# Patient Record
Sex: Female | Born: 1960 | Race: White | Smoking: Never smoker
Health system: Southern US, Community
[De-identification: ages and names within clinical notes are randomized; demographics above are authoritative.]

## PROBLEM LIST (undated history)

## (undated) DIAGNOSIS — K8020 Calculus of gallbladder without cholecystitis without obstruction: Secondary | ICD-10-CM

## (undated) DIAGNOSIS — Z98891 History of uterine scar from previous surgery: Secondary | ICD-10-CM

## (undated) DIAGNOSIS — I10 Essential (primary) hypertension: Secondary | ICD-10-CM

## (undated) DIAGNOSIS — F329 Major depressive disorder, single episode, unspecified: Secondary | ICD-10-CM

## (undated) DIAGNOSIS — G894 Chronic pain syndrome: Secondary | ICD-10-CM

## (undated) DIAGNOSIS — H5500 Unspecified nystagmus: Secondary | ICD-10-CM

## (undated) DIAGNOSIS — E119 Type 2 diabetes mellitus without complications: Secondary | ICD-10-CM

## (undated) DIAGNOSIS — I209 Angina pectoris, unspecified: Secondary | ICD-10-CM

## (undated) DIAGNOSIS — G4730 Sleep apnea, unspecified: Secondary | ICD-10-CM

## (undated) DIAGNOSIS — F32A Depression, unspecified: Secondary | ICD-10-CM

## (undated) DIAGNOSIS — L659 Nonscarring hair loss, unspecified: Secondary | ICD-10-CM

## (undated) DIAGNOSIS — F419 Anxiety disorder, unspecified: Secondary | ICD-10-CM

## (undated) DIAGNOSIS — G4733 Obstructive sleep apnea (adult) (pediatric): Secondary | ICD-10-CM

## (undated) DIAGNOSIS — R42 Dizziness and giddiness: Secondary | ICD-10-CM

## (undated) DIAGNOSIS — M47816 Spondylosis without myelopathy or radiculopathy, lumbar region: Secondary | ICD-10-CM

## (undated) DIAGNOSIS — K219 Gastro-esophageal reflux disease without esophagitis: Secondary | ICD-10-CM

## (undated) DIAGNOSIS — F411 Generalized anxiety disorder: Secondary | ICD-10-CM

## (undated) HISTORY — DX: Type 2 diabetes mellitus without complications: E11.9

## (undated) HISTORY — DX: Nonscarring hair loss, unspecified: L65.9

## (undated) HISTORY — DX: Major depressive disorder, single episode, unspecified: F32.9

## (undated) HISTORY — DX: Essential (primary) hypertension: I10

## (undated) HISTORY — DX: Anxiety disorder, unspecified: F41.9

## (undated) HISTORY — DX: Chronic pain syndrome: G89.4

## (undated) HISTORY — DX: Generalized anxiety disorder: F41.1

## (undated) HISTORY — PX: COLONOSCOPY: SHX174

## (undated) HISTORY — DX: Calculus of gallbladder without cholecystitis without obstruction: K80.20

## (undated) HISTORY — DX: History of uterine scar from previous surgery: Z98.891

## (undated) HISTORY — DX: Dizziness and giddiness: R42

## (undated) HISTORY — DX: Spondylosis without myelopathy or radiculopathy, lumbar region: M47.816

## (undated) HISTORY — DX: Morbid (severe) obesity due to excess calories: E66.01

## (undated) HISTORY — DX: Depression, unspecified: F32.A

## (undated) HISTORY — DX: Unspecified nystagmus: H55.00

---

## 1990-06-26 DIAGNOSIS — Z98891 History of uterine scar from previous surgery: Secondary | ICD-10-CM

## 1990-06-26 HISTORY — DX: History of uterine scar from previous surgery: Z98.891

## 1997-06-26 HISTORY — PX: HEMATOMA EVACUATION: SHX5118

## 2007-04-19 ENCOUNTER — Ambulatory Visit: Payer: Self-pay | Admitting: Family Medicine

## 2007-04-23 DIAGNOSIS — G4730 Sleep apnea, unspecified: Secondary | ICD-10-CM | POA: Insufficient documentation

## 2007-04-23 DIAGNOSIS — E785 Hyperlipidemia, unspecified: Secondary | ICD-10-CM | POA: Insufficient documentation

## 2007-04-23 DIAGNOSIS — F32A Depression, unspecified: Secondary | ICD-10-CM | POA: Insufficient documentation

## 2007-04-23 DIAGNOSIS — J309 Allergic rhinitis, unspecified: Secondary | ICD-10-CM | POA: Insufficient documentation

## 2007-06-07 ENCOUNTER — Ambulatory Visit: Payer: Self-pay

## 2007-08-21 DIAGNOSIS — Z0000 Encounter for general adult medical examination without abnormal findings: Secondary | ICD-10-CM | POA: Insufficient documentation

## 2007-08-21 DIAGNOSIS — E669 Obesity, unspecified: Secondary | ICD-10-CM | POA: Insufficient documentation

## 2008-09-21 ENCOUNTER — Emergency Department: Payer: Self-pay | Admitting: Emergency Medicine

## 2009-06-26 HISTORY — PX: FINGER SURGERY: SHX640

## 2010-03-04 DIAGNOSIS — F419 Anxiety disorder, unspecified: Secondary | ICD-10-CM | POA: Insufficient documentation

## 2012-08-13 ENCOUNTER — Ambulatory Visit: Payer: Self-pay

## 2012-08-16 ENCOUNTER — Ambulatory Visit: Payer: Self-pay

## 2012-11-28 ENCOUNTER — Ambulatory Visit (INDEPENDENT_AMBULATORY_CARE_PROVIDER_SITE_OTHER): Payer: PRIVATE HEALTH INSURANCE | Admitting: General Surgery

## 2012-12-19 ENCOUNTER — Telehealth (INDEPENDENT_AMBULATORY_CARE_PROVIDER_SITE_OTHER): Payer: Self-pay | Admitting: General Surgery

## 2012-12-19 ENCOUNTER — Ambulatory Visit (INDEPENDENT_AMBULATORY_CARE_PROVIDER_SITE_OTHER): Payer: PRIVATE HEALTH INSURANCE | Admitting: General Surgery

## 2012-12-19 NOTE — Telephone Encounter (Signed)
LMOM at 11:30 to ask why patient no showed for her appt with EW 6/26 @ 10..told her to call back to r/s

## 2013-01-03 ENCOUNTER — Encounter (INDEPENDENT_AMBULATORY_CARE_PROVIDER_SITE_OTHER): Payer: Self-pay | Admitting: General Surgery

## 2013-02-05 ENCOUNTER — Ambulatory Visit: Payer: Self-pay

## 2013-03-25 ENCOUNTER — Encounter (INDEPENDENT_AMBULATORY_CARE_PROVIDER_SITE_OTHER): Payer: Self-pay

## 2013-08-19 ENCOUNTER — Ambulatory Visit: Payer: Self-pay

## 2013-08-28 ENCOUNTER — Ambulatory Visit: Payer: Self-pay

## 2013-10-14 ENCOUNTER — Ambulatory Visit: Payer: Self-pay | Admitting: Gastroenterology

## 2014-02-26 ENCOUNTER — Telehealth: Payer: Self-pay | Admitting: Nurse Practitioner

## 2014-03-09 NOTE — Telephone Encounter (Signed)
Several attempts have been made to contact patient. Detailed message left to call back if still needs apptt.

## 2014-12-30 ENCOUNTER — Other Ambulatory Visit
Admission: RE | Admit: 2014-12-30 | Discharge: 2014-12-30 | Disposition: A | Discharge status: Home / Self Care | Payer: Medicaid Other | Source: Ambulatory Visit | Attending: Nurse Practitioner | Admitting: Nurse Practitioner

## 2014-12-30 DIAGNOSIS — Z0000 Encounter for general adult medical examination without abnormal findings: Secondary | ICD-10-CM | POA: Diagnosis present

## 2014-12-30 DIAGNOSIS — R5383 Other fatigue: Secondary | ICD-10-CM | POA: Diagnosis not present

## 2014-12-30 LAB — CBC WITH DIFFERENTIAL/PLATELET
Basophils Absolute: 0 10*3/uL (ref 0–0.1)
Basophils Relative: 0 %
EOS ABS: 0 10*3/uL (ref 0–0.7)
EOS PCT: 0 %
HCT: 44.6 % (ref 35.0–47.0)
Hemoglobin: 15.1 g/dL (ref 12.0–16.0)
Lymphocytes Relative: 19 %
Lymphs Abs: 2 10*3/uL (ref 1.0–3.6)
MCH: 28.2 pg (ref 26.0–34.0)
MCHC: 33.8 g/dL (ref 32.0–36.0)
MCV: 83.4 fL (ref 80.0–100.0)
MONOS PCT: 9 %
Monocytes Absolute: 0.9 10*3/uL (ref 0.2–0.9)
Neutro Abs: 7.5 10*3/uL — ABNORMAL HIGH (ref 1.4–6.5)
Neutrophils Relative %: 72 %
PLATELETS: 296 10*3/uL (ref 150–440)
RBC: 5.35 MIL/uL — AB (ref 3.80–5.20)
RDW: 14.7 % — ABNORMAL HIGH (ref 11.5–14.5)
WBC: 10.5 10*3/uL (ref 3.6–11.0)

## 2014-12-30 LAB — LIPID PANEL
Cholesterol: 228 mg/dL — ABNORMAL HIGH (ref 0–200)
HDL: 55 mg/dL (ref >40)
LDL Cholesterol: 141 mg/dL — ABNORMAL HIGH (ref 0–99)
Total CHOL/HDL Ratio: 4.1 RATIO
Triglycerides: 158 mg/dL — ABNORMAL HIGH (ref <150)
VLDL: 32 mg/dL (ref 0–40)

## 2014-12-30 LAB — COMPREHENSIVE METABOLIC PANEL
ALBUMIN: 4.2 g/dL (ref 3.5–5.0)
ALT: 21 U/L (ref 14–54)
ANION GAP: 8 (ref 5–15)
AST: 18 U/L (ref 15–41)
Alkaline Phosphatase: 86 U/L (ref 38–126)
BUN: 18 mg/dL (ref 6–20)
CALCIUM: 9.2 mg/dL (ref 8.9–10.3)
CO2: 26 mmol/L (ref 22–32)
CREATININE: 0.68 mg/dL (ref 0.44–1.00)
Chloride: 103 mmol/L (ref 101–111)
GFR calc Af Amer: >60 mL/min (ref >60)
GFR calc non Af Amer: >60 mL/min (ref >60)
Glucose, Bld: 117 mg/dL — ABNORMAL HIGH (ref 65–99)
POTASSIUM: 4.1 mmol/L (ref 3.5–5.1)
Sodium: 137 mmol/L (ref 135–145)
TOTAL PROTEIN: 8.1 g/dL (ref 6.5–8.1)
Total Bilirubin: 0.5 mg/dL (ref 0.3–1.2)

## 2014-12-30 LAB — T4, FREE: FREE T4: 0.7 ng/dL (ref 0.61–1.12)

## 2014-12-30 LAB — TSH: TSH: 0.961 u[IU]/mL (ref 0.350–4.500)

## 2014-12-30 LAB — HEMOGLOBIN A1C: HEMOGLOBIN A1C: 5.6 % (ref 4.0–6.0)

## 2014-12-31 LAB — T3: T3 TOTAL: 111 ng/dL (ref 71–180)

## 2015-02-24 ENCOUNTER — Ambulatory Visit: Payer: Self-pay | Admitting: Psychiatry

## 2016-03-10 ENCOUNTER — Other Ambulatory Visit
Admission: RE | Admit: 2016-03-10 | Discharge: 2016-03-10 | Disposition: A | Discharge status: Home / Self Care | Payer: BLUE CROSS/BLUE SHIELD | Source: Ambulatory Visit | Attending: Nurse Practitioner | Admitting: Nurse Practitioner

## 2016-03-10 DIAGNOSIS — E782 Mixed hyperlipidemia: Secondary | ICD-10-CM | POA: Insufficient documentation

## 2016-03-10 DIAGNOSIS — Z0001 Encounter for general adult medical examination with abnormal findings: Secondary | ICD-10-CM | POA: Diagnosis not present

## 2016-03-10 DIAGNOSIS — E559 Vitamin D deficiency, unspecified: Secondary | ICD-10-CM | POA: Diagnosis not present

## 2016-03-10 DIAGNOSIS — I10 Essential (primary) hypertension: Secondary | ICD-10-CM | POA: Insufficient documentation

## 2016-03-10 LAB — COMPREHENSIVE METABOLIC PANEL
ALT: 16 U/L (ref 14–54)
AST: 18 U/L (ref 15–41)
Albumin: 4 g/dL (ref 3.5–5.0)
Alkaline Phosphatase: 75 U/L (ref 38–126)
Anion gap: 7 (ref 5–15)
BUN: 29 mg/dL — AB (ref 6–20)
CALCIUM: 8.9 mg/dL (ref 8.9–10.3)
CO2: 25 mmol/L (ref 22–32)
CREATININE: 0.89 mg/dL (ref 0.44–1.00)
Chloride: 106 mmol/L (ref 101–111)
GFR calc Af Amer: >60 mL/min (ref >60)
GFR calc non Af Amer: >60 mL/min (ref >60)
Glucose, Bld: 98 mg/dL (ref 65–99)
Potassium: 4.2 mmol/L (ref 3.5–5.1)
Sodium: 138 mmol/L (ref 135–145)
TOTAL PROTEIN: 7.4 g/dL (ref 6.5–8.1)
Total Bilirubin: 0.8 mg/dL (ref 0.3–1.2)

## 2016-03-10 LAB — CBC
HEMATOCRIT: 37.2 % (ref 35.0–47.0)
HEMOGLOBIN: 12.8 g/dL (ref 12.0–16.0)
MCH: 28.4 pg (ref 26.0–34.0)
MCHC: 34.4 g/dL (ref 32.0–36.0)
MCV: 82.6 fL (ref 80.0–100.0)
Platelets: 209 10*3/uL (ref 150–440)
RBC: 4.5 MIL/uL (ref 3.80–5.20)
RDW: 14.7 % — ABNORMAL HIGH (ref 11.5–14.5)
WBC: 6.9 10*3/uL (ref 3.6–11.0)

## 2016-03-10 LAB — LIPID PANEL
CHOLESTEROL: 226 mg/dL — AB (ref 0–200)
HDL: 45 mg/dL (ref >40)
LDL Cholesterol: 144 mg/dL — ABNORMAL HIGH (ref 0–99)
TRIGLYCERIDES: 186 mg/dL — AB (ref <150)
Total CHOL/HDL Ratio: 5 RATIO
VLDL: 37 mg/dL (ref 0–40)

## 2016-03-10 LAB — T4, FREE: Free T4: 0.7 ng/dL (ref 0.61–1.12)

## 2016-03-10 LAB — TSH: TSH: 2.522 u[IU]/mL (ref 0.350–4.500)

## 2016-03-11 LAB — VITAMIN D 25 HYDROXY (VIT D DEFICIENCY, FRACTURES): Vit D, 25-Hydroxy: 22.5 ng/mL — ABNORMAL LOW (ref 30.0–100.0)

## 2016-03-21 ENCOUNTER — Other Ambulatory Visit: Payer: Self-pay | Admitting: Nurse Practitioner

## 2016-03-21 DIAGNOSIS — Z1231 Encounter for screening mammogram for malignant neoplasm of breast: Secondary | ICD-10-CM

## 2016-04-13 ENCOUNTER — Ambulatory Visit: Payer: BLUE CROSS/BLUE SHIELD

## 2016-05-04 ENCOUNTER — Ambulatory Visit: Payer: BLUE CROSS/BLUE SHIELD

## 2016-07-19 ENCOUNTER — Ambulatory Visit: Payer: BLUE CROSS/BLUE SHIELD | Attending: Nurse Practitioner

## 2016-08-28 ENCOUNTER — Encounter: Payer: Self-pay | Admitting: Obstetrics and Gynecology

## 2016-08-28 ENCOUNTER — Ambulatory Visit (INDEPENDENT_AMBULATORY_CARE_PROVIDER_SITE_OTHER): Payer: BLUE CROSS/BLUE SHIELD | Admitting: Obstetrics and Gynecology

## 2016-08-28 VITALS — BP 148/98 | HR 75 | Ht 68.0 in | Wt 345.0 lb

## 2016-08-28 DIAGNOSIS — Z780 Asymptomatic menopausal state: Secondary | ICD-10-CM

## 2016-08-28 DIAGNOSIS — R10824 Left lower quadrant rebound abdominal tenderness: Secondary | ICD-10-CM | POA: Diagnosis not present

## 2016-08-28 DIAGNOSIS — N83202 Unspecified ovarian cyst, left side: Secondary | ICD-10-CM

## 2016-08-28 NOTE — Addendum Note (Signed)
Addended by: Dorthula Nettles on: 08/28/2016 12:15 PM   Modules accepted: Orders

## 2016-08-28 NOTE — Addendum Note (Signed)
Addended by: Dorthula Nettles on: 08/28/2016 12:03 PM   Modules accepted: Orders

## 2016-08-28 NOTE — Patient Instructions (Signed)
Ovarian Cyst  An ovarian cyst is a fluid-filled sac that forms on an ovary. The ovaries are small organs that produce eggs in women. Various types of cysts can form on the ovaries. Some may cause symptoms and require treatment. Most ovarian cysts go away on their own, are not cancerous (are benign), and do not cause problems. Common types of ovarian cysts include:  Functional (follicle) cysts.  Occur during the menstrual cycle, and usually go away with the next menstrual cycle if you do not get pregnant.  Usually cause no symptoms.  Endometriomas.  Are cysts that form from the tissue that lines the uterus (endometrium).  Are sometimes called "chocolate cysts" because they become filled with blood that turns brown.  Can cause pain in the lower abdomen during intercourse and during your period.  Cystadenoma cysts.  Develop from cells on the outside surface of the ovary.  Can get very large and cause lower abdomen pain and pain with intercourse.  Can cause severe pain if they twist or break open (rupture).  Dermoid cysts.  Are sometimes found in both ovaries.  May contain different kinds of body tissue, such as skin, teeth, hair, or cartilage.  Usually do not cause symptoms unless they get very big.  Theca lutein cysts.  Occur when too much of a certain hormone (human chorionic gonadotropin) is produced and overstimulates the ovaries to produce an egg.  Are most common after having procedures used to assist with the conception of a baby (in vitro fertilization). What are the causes? Ovarian cysts may be caused by:  Ovarian hyperstimulation syndrome. This is a condition that can develop from taking fertility medicines. It causes multiple large ovarian cysts to form.  Polycystic ovarian syndrome (PCOS). This is a common hormonal disorder that can cause ovarian cysts, as well as problems with your period or fertility. What increases the risk? The following factors may make you  more likely to develop ovarian cysts:  Being overweight or obese.  Taking fertility medicines.  Taking certain forms of hormonal birth control.  Smoking. What are the signs or symptoms? Many ovarian cysts do not cause symptoms. If symptoms are present, they may include:  Pelvic pain or pressure.  Pain in the lower abdomen.  Pain during sex.  Abdominal swelling.  Abnormal menstrual periods.  Increasing pain with menstrual periods. How is this diagnosed? These cysts are commonly found during a routine pelvic exam. You may have tests to find out more about the cyst, such as:  Ultrasound.  X-ray of the pelvis.  CT scan.  MRI.  Blood tests. How is this treated? Many ovarian cysts go away on their own without treatment. Your health care provider may want to check your cyst regularly for 2-3 months to see if it changes. If you are in menopause, it is especially important to have your cyst monitored closely because menopausal women have a higher rate of ovarian cancer. When treatment is needed, it may include:  Medicines to help relieve pain.  A procedure to drain the cyst (aspiration).  Surgery to remove the whole cyst.  Hormone treatment or birth control pills. These methods are sometimes used to help dissolve a cyst. Follow these instructions at home:  Take over-the-counter and prescription medicines only as told by your health care provider.  Do not drive or use heavy machinery while taking prescription pain medicine.  Get regular pelvic exams and Pap tests as often as told by your health care provider.  Return to your   normal activities as told by your health care provider. Ask your health care provider what activities are safe for you.  Do not use any products that contain nicotine or tobacco, such as cigarettes and e-cigarettes. If you need help quitting, ask your health care provider.  Keep all follow-up visits as told by your health care provider. This is  important. Contact a health care provider if:  Your periods are late, irregular, or painful, or they stop.  You have pelvic pain that does not go away.  You have pressure on your bladder or trouble emptying your bladder completely.  You have pain during sex.  You have any of the following in your abdomen:  A feeling of fullness.  Pressure.  Discomfort.  Pain that does not go away.  Swelling.  You feel generally ill.  You become constipated.  You lose your appetite.  You develop severe acne.  You start to have more body hair and facial hair.  You are gaining weight or losing weight without changing your exercise and eating habits.  You think you may be pregnant. Get help right away if:  You have abdominal pain that is severe or gets worse.  You cannot eat or drink without vomiting.  You suddenly develop a fever.  Your menstrual period is much heavier than usual. This information is not intended to replace advice given to you by your health care provider. Make sure you discuss any questions you have with your health care provider. Document Released: 06/12/2005 Document Revised: 12/31/2015 Document Reviewed: 11/14/2015 Elsevier Interactive Patient Education  2017 [employer]  

## 2016-08-28 NOTE — Progress Notes (Signed)
Obstetrics & Gynecology Office Visit   Chief Complaint:  Chief Complaint  Patient presents with  . left ovarian cyst    Referred by PCP    History of Present Illness: 56 year old caucasian female referred by [facility] for findings of 5.2cm left ovarian mass found during work up of right pelvic abdominal pain.  She was treated with steroids and noted improvement in her pain.  She denies early satiety, increased abdominal bloating, weight gain, vaginal bleeding, night sweats, changes in bowl habits.  She does not reports a positive family history of ovarian cancer or breast cancer.  Currently pain is 0/10.  No prior imaging to assess whether this cyst is chronic or acute.     Review of Systems: ROS  Past Medical History:  Past Medical History:  Diagnosis Date  . Anxiety   . Depression   . History of C-section 57  . Hypertension     Past Surgical History:  Past Surgical History:  Procedure Laterality Date  . [place]    Gynecologic History: No LMP recorded. Patient is postmenopausal.  Obstetric History: G2P2000  Family History:  Family History  Problem Relation Age of Onset  . Breast cancer Mother 60  . Colon cancer Mother   . Brain cancer Mother   . Heart Problems Father   . Colon cancer Brother   . Heart Problems Brother     Social History:  Social History   Social History  . Marital status: Divorced    Spouse name: N/A  . Number of children: N/A  . Years of education: N/A   Occupational History  . Not on file.   Social History Main Topics  . Smoking status: Never Smoker  . Smokeless tobacco: Never Used  . Alcohol use Not on file  . Drug use: No  . Sexual activity: No   Other Topics Concern  . Not on file   Social History Narrative  . No narrative on file    Allergies:  No Known Allergies  Medications: Prior to Admission medications   Medication Sig Start Date End Date Taking? Authorizing Provider  ALPRAZolam  (XANAX) 0.5 MG tablet TAKE 1 TABLET BY MOUTH 3 TIMES DAILY AS NEEDED FOR ANXIETY 08/14/16  Yes Historical Provider, MD  cetirizine (ZYRTEC) 10 MG tablet TAKE 1 TABLET(S) BY MOUTH DAILY FOR ALLERGIES 08/05/16  Yes Historical Provider, MD  desvenlafaxine (PRISTIQ) 50 MG 24 hr tablet TAKE 1 TABLET(S) TWICE A DAY DAILY 08/05/16  Yes Historical Provider, MD  enalapril (VASOTEC) 20 MG tablet Take 20 mg by mouth.   Yes Historical Provider, MD  gabapentin (NEURONTIN) 300 MG capsule Take 300 mg by mouth. 04/28/14  Yes Historical Provider, MD  hydrochlorothiazide (HYDRODIURIL) 50 MG tablet Take 50 mg by mouth daily.   Yes Historical Provider, MD    Physical Exam Vitals:  Vitals:   08/28/16 1048  BP: (!) 148/98  Pulse: 75   No LMP recorded. Patient is postmenopausal.  General: NAD, obese HEENT: normocephalic, anicteric Thyroid: no enlargement, no palpable nodules Pulmonary: No increased work of breathing Cardiovascular: RRR, distal pulses 2+ Abdomen: NABS, soft, non-tender, non-distended.  Umbilicus without lesions.  No hepatomegaly, splenomegaly or masses palpable. No evidence of hernia  Genitourinary:  External: Normal external female genitalia.  Normal urethral meatus, normal  Bartholin's and Skene's glands.    Vagina: Normal vaginal mucosa, no evidence of prolapse.    Cervix: Grossly normal in appearance, no bleeding  Uterus:  Non-enlarged, mobile, normal contour.  No CMT  Adnexa: ovaries non-enlarged, no adnexal masses  Rectal: deferred  Lymphatic: no evidence of inguinal lymphadenopathy Extremities: no edema, erythema, or tenderness Neurologic: Grossly intact Psychiatric: mood appropriate, affect full  Female chaperone present for pelvic and breast  portions of the physical exam   Assessment: 56 y.o. G2P2000 No problem-specific Assessment & Plan notes found for this encounter.   Plan: Problem List Items Addressed This Visit    None    Visit Diagnoses    Cyst of left ovary    -   Primary   Relevant Orders   OVA 1   US Transvaginal Non-OB   Left lower quadrant abdominal tenderness with rebound tenderness       Relevant Orders   OVA 1   US Transvaginal Non-OB   Postmenopausal       Relevant Orders   OVA 1   US Transvaginal Non-OB     - Check OVA1 testing  - Repeat US to assess stability or increase in size in cyst also further characterize - final management plan pending results of blood work and repeat imaging

## 2016-09-01 LAB — OVA 1
CA 125: 8.1 U/mL (ref 0.0–38.1)
CEA: 1 ng/mL (ref 0.0–4.7)
LIPID-ASSOCIATED SIALIC ACID: 9 mg/dL (ref <20)

## 2016-09-06 ENCOUNTER — Telehealth: Payer: Self-pay | Admitting: Obstetrics and Gynecology

## 2016-09-06 NOTE — Telephone Encounter (Signed)
Results call

## 2016-09-08 ENCOUNTER — Ambulatory Visit
Admission: RE | Admit: 2016-09-08 | Discharge: 2016-09-08 | Disposition: A | Discharge status: Home / Self Care | Payer: BLUE CROSS/BLUE SHIELD | Source: Ambulatory Visit | Attending: Nurse Practitioner | Admitting: Nurse Practitioner

## 2016-09-08 DIAGNOSIS — Z1231 Encounter for screening mammogram for malignant neoplasm of breast: Secondary | ICD-10-CM | POA: Insufficient documentation

## 2016-09-26 ENCOUNTER — Other Ambulatory Visit: Payer: BLUE CROSS/BLUE SHIELD

## 2016-09-26 ENCOUNTER — Ambulatory Visit: Payer: BLUE CROSS/BLUE SHIELD | Admitting: Obstetrics and Gynecology

## 2016-10-17 ENCOUNTER — Ambulatory Visit: Payer: BLUE CROSS/BLUE SHIELD | Admitting: Obstetrics and Gynecology

## 2016-10-17 ENCOUNTER — Other Ambulatory Visit: Payer: BLUE CROSS/BLUE SHIELD

## 2016-10-17 ENCOUNTER — Ambulatory Visit: Payer: BLUE CROSS/BLUE SHIELD

## 2017-05-22 ENCOUNTER — Other Ambulatory Visit
Admission: RE | Admit: 2017-05-22 | Discharge: 2017-05-22 | Disposition: A | Discharge status: Home / Self Care | Payer: BLUE CROSS/BLUE SHIELD | Source: Ambulatory Visit | Attending: Nurse Practitioner | Admitting: Nurse Practitioner

## 2017-05-22 DIAGNOSIS — I10 Essential (primary) hypertension: Secondary | ICD-10-CM | POA: Diagnosis present

## 2017-05-22 DIAGNOSIS — Z0000 Encounter for general adult medical examination without abnormal findings: Secondary | ICD-10-CM | POA: Diagnosis not present

## 2017-05-22 DIAGNOSIS — E669 Obesity, unspecified: Secondary | ICD-10-CM | POA: Diagnosis present

## 2017-05-22 LAB — COMPREHENSIVE METABOLIC PANEL
ALBUMIN: 3.9 g/dL (ref 3.5–5.0)
ALT: 26 U/L (ref 14–54)
ANION GAP: 9 (ref 5–15)
AST: 25 U/L (ref 15–41)
Alkaline Phosphatase: 74 U/L (ref 38–126)
BILIRUBIN TOTAL: 0.6 mg/dL (ref 0.3–1.2)
BUN: 18 mg/dL (ref 6–20)
CO2: 22 mmol/L (ref 22–32)
Calcium: 9.1 mg/dL (ref 8.9–10.3)
Chloride: 106 mmol/L (ref 101–111)
Creatinine, Ser: 0.8 mg/dL (ref 0.44–1.00)
GFR calc non Af Amer: >60 mL/min (ref >60)
Glucose, Bld: 106 mg/dL — ABNORMAL HIGH (ref 65–99)
POTASSIUM: 4.1 mmol/L (ref 3.5–5.1)
SODIUM: 137 mmol/L (ref 135–145)
TOTAL PROTEIN: 7.1 g/dL (ref 6.5–8.1)

## 2017-05-22 LAB — LIPID PANEL
CHOL/HDL RATIO: 4.8 ratio
Cholesterol: 216 mg/dL — ABNORMAL HIGH (ref 0–200)
HDL: 45 mg/dL (ref >40)
LDL Cholesterol: 136 mg/dL — ABNORMAL HIGH (ref 0–99)
TRIGLYCERIDES: 173 mg/dL — AB (ref <150)
VLDL: 35 mg/dL (ref 0–40)

## 2017-05-22 LAB — CBC WITH DIFFERENTIAL/PLATELET
Basophils Absolute: 0.1 10*3/uL (ref 0–0.1)
Basophils Relative: 1 %
EOS ABS: 0.2 10*3/uL (ref 0–0.7)
EOS PCT: 2 %
HCT: 39.3 % (ref 35.0–47.0)
Hemoglobin: 13 g/dL (ref 12.0–16.0)
LYMPHS ABS: 2.2 10*3/uL (ref 1.0–3.6)
Lymphocytes Relative: 30 %
MCH: 27.9 pg (ref 26.0–34.0)
MCHC: 33.1 g/dL (ref 32.0–36.0)
MCV: 84.4 fL (ref 80.0–100.0)
MONO ABS: 0.7 10*3/uL (ref 0.2–0.9)
MONOS PCT: 10 %
Neutro Abs: 4.2 10*3/uL (ref 1.4–6.5)
Neutrophils Relative %: 57 %
PLATELETS: 258 10*3/uL (ref 150–440)
RBC: 4.66 MIL/uL (ref 3.80–5.20)
RDW: 15.4 % — AB (ref 11.5–14.5)
WBC: 7.3 10*3/uL (ref 3.6–11.0)

## 2017-05-22 LAB — T4, FREE: FREE T4: 0.73 ng/dL (ref 0.61–1.12)

## 2017-05-22 LAB — TSH: TSH: 1.773 u[IU]/mL (ref 0.350–4.500)

## 2017-06-08 DIAGNOSIS — G4733 Obstructive sleep apnea (adult) (pediatric): Secondary | ICD-10-CM | POA: Diagnosis not present

## 2017-06-27 DIAGNOSIS — F331 Major depressive disorder, recurrent, moderate: Secondary | ICD-10-CM | POA: Insufficient documentation

## 2017-06-27 DIAGNOSIS — F411 Generalized anxiety disorder: Secondary | ICD-10-CM | POA: Insufficient documentation

## 2017-06-27 DIAGNOSIS — I10 Essential (primary) hypertension: Secondary | ICD-10-CM | POA: Insufficient documentation

## 2017-06-27 DIAGNOSIS — E1159 Type 2 diabetes mellitus with other circulatory complications: Secondary | ICD-10-CM | POA: Insufficient documentation

## 2017-06-27 DIAGNOSIS — G4710 Hypersomnia, unspecified: Secondary | ICD-10-CM | POA: Insufficient documentation

## 2017-06-27 DIAGNOSIS — N390 Urinary tract infection, site not specified: Secondary | ICD-10-CM | POA: Insufficient documentation

## 2017-06-27 DIAGNOSIS — F1390 Sedative, hypnotic, or anxiolytic use, unspecified, uncomplicated: Secondary | ICD-10-CM | POA: Insufficient documentation

## 2017-06-28 ENCOUNTER — Ambulatory Visit: Payer: Self-pay | Admitting: Nurse Practitioner

## 2017-07-03 ENCOUNTER — Ambulatory Visit: Payer: BLUE CROSS/BLUE SHIELD | Admitting: Nurse Practitioner

## 2017-07-03 ENCOUNTER — Encounter: Payer: Self-pay | Admitting: Nurse Practitioner

## 2017-07-03 VITALS — BP 165/89 | HR 76 | Resp 16 | Ht 68.0 in | Wt 367.2 lb

## 2017-07-03 DIAGNOSIS — M5440 Lumbago with sciatica, unspecified side: Secondary | ICD-10-CM | POA: Diagnosis not present

## 2017-07-03 DIAGNOSIS — F331 Major depressive disorder, recurrent, moderate: Secondary | ICD-10-CM

## 2017-07-03 DIAGNOSIS — F411 Generalized anxiety disorder: Secondary | ICD-10-CM | POA: Diagnosis not present

## 2017-07-03 DIAGNOSIS — G4733 Obstructive sleep apnea (adult) (pediatric): Secondary | ICD-10-CM | POA: Diagnosis not present

## 2017-07-03 DIAGNOSIS — J309 Allergic rhinitis, unspecified: Secondary | ICD-10-CM | POA: Insufficient documentation

## 2017-07-03 DIAGNOSIS — I10 Essential (primary) hypertension: Secondary | ICD-10-CM

## 2017-07-03 DIAGNOSIS — M25561 Pain in right knee: Secondary | ICD-10-CM | POA: Insufficient documentation

## 2017-07-03 DIAGNOSIS — M25569 Pain in unspecified knee: Secondary | ICD-10-CM | POA: Insufficient documentation

## 2017-07-03 MED ORDER — DESVENLAFAXINE SUCCINATE ER 100 MG PO TB24: 100.0000 mg | ORAL_TABLET | Freq: Every day | ORAL | 3 refills | Status: DC

## 2017-07-03 MED ORDER — OXYCODONE-ACETAMINOPHEN 5-325 MG PO TABS: 1.0000 | ORAL_TABLET | Freq: Three times a day (TID) | ORAL | 0 refills | Status: DC | PRN

## 2017-07-03 MED ORDER — ALPRAZOLAM 0.5 MG PO TABS: 0.5000 mg | ORAL_TABLET | Freq: Three times a day (TID) | ORAL | 3 refills | Status: DC | PRN

## 2017-07-03 NOTE — Progress Notes (Signed)
[facility] [place], [place] [address]  Internal MEDICINE  Office Visit Note  Patient Name: Candace Clayton  [id number]  [id number]  Date of Service: 07/03/2017     Complaints/HPI Pt is here for routine follow up.  The patient is here for routine follow up. She does note that her depression has worsened some. Hard to find the energy and motivation to get out of bed on her days off. Feels like there is a great deal of work place bullying going on and she is the focus of the bullying. Becomes easily tearful when taling about her workplace situation Continues to have right sided lower back pain, radiating into the right hip and right upper leg. She does need to have refills of her pain medication, which she does take when needed     Current Medication: Outpatient Encounter Medications as of 07/03/2017  Medication Sig  . ALPRAZolam (XANAX) 0.5 MG tablet TAKE 1 TABLET BY MOUTH 3 TIMES DAILY AS NEEDED FOR ANXIETY  . cetirizine (ZYRTEC) 10 MG tablet TAKE 1 TABLET(S) BY MOUTH DAILY FOR ALLERGIES  . desvenlafaxine (PRISTIQ) 50 MG 24 hr tablet TAKE 1 TABLET(S) TWICE A DAY DAILY  . enalapril (VASOTEC) 20 MG tablet Take 20 mg by mouth 2 (two) times daily.   . hydrochlorothiazide (HYDRODIURIL) 25 MG tablet Take 25 mg by mouth daily.   Marland Kitchen lidocaine (LIDODERM) 5 % Place 1 patch onto the skin daily as needed (back pain). Remove & Discard patch within 12 hours or as directed by MD  . oxyCODONE-acetaminophen (PERCOCET/ROXICET) 5-325 MG tablet Take 1 tablet by mouth 3 (three) times daily as needed for severe pain.  Marland Kitchen tiZANidine (ZANAFLEX) 4 MG tablet Take 4 mg by mouth 2 (two) times daily as needed for muscle spasms.  Marland Kitchen gabapentin (NEURONTIN) 300 MG capsule Take 300 mg by mouth.   No facility-administered encounter medications on file as of 07/03/2017.     Surgical History: Past Surgical History:  Procedure Laterality Date  . CESAREAN SECTION  1992  . FINGER SURGERY  2011     Medical History: Past Medical History:  Diagnosis Date  . Anxiety   . Depression   . History of C-section 18  . Hypertension     Family History: Family History  Problem Relation Age of Onset  . Breast cancer Mother 40       x 3 times  . Colon cancer Mother   . Brain cancer Mother   . Cancer - Colon Mother   . Cancer - Other Mother   . Heart Problems Father   . Colon cancer Brother   . Heart Problems Brother     Social History   Socioeconomic History  . Marital status: Divorced    Spouse name: Not on file  . Number of children: Not on file  . Years of education: Not on file  . Highest education level: Not on file  Social Needs  . Financial resource strain: Not on file  . Food insecurity - worry: Not on file  . Food insecurity - inability: Not on file  . Transportation needs - medical: Not on file  . Transportation needs - non-medical: Not on file  Occupational History  . Not on file  Tobacco Use  . Smoking status: Never Smoker  . Smokeless tobacco: Never Used  Substance and Sexual Activity  . Alcohol use: No    Frequency: Never  . Drug use: No  . Sexual activity: No  Birth control/protection: Post-menopausal  Other Topics Concern  . Not on file  Social History Narrative  . Not on file      Review of Systems  Constitutional: Positive for fatigue. Negative for activity change, appetite change and unexpected weight change.  HENT: Negative.   Eyes: Negative.   Respiratory: Negative for chest tightness, shortness of breath and wheezing.   Cardiovascular: Negative for chest pain and palpitations.  Gastrointestinal: Negative for abdominal pain, constipation, diarrhea, nausea and vomiting.  Endocrine: Negative for cold intolerance, heat intolerance, polydipsia and polyphagia.  Genitourinary: Negative.   Musculoskeletal: Positive for arthralgias, back pain and myalgias.  Skin: Negative.   Allergic/Immunologic: Negative.   Neurological: Negative.    Hematological: Negative.   Psychiatric/Behavioral: Positive for behavioral problems and sleep disturbance. The patient is nervous/anxious.    Today's Vitals   07/03/17 1006  BP: (!) 165/89  Pulse: 76  Resp: 16  SpO2: 98%  Weight: (!) 367 lb 3.2 oz (166.6 kg)  Height: 5\' 8"  (1.727 m)    Physical Exam  Constitutional: She is oriented to person, place, and time. She appears well-developed and well-nourished.  HENT:  Head: Normocephalic and atraumatic.  Eyes: Pupils are equal, round, and reactive to light.  Neck: Normal range of motion. Neck supple. No thyromegaly present.  Cardiovascular: Normal rate, regular rhythm and normal heart sounds.  Pulmonary/Chest: Effort normal and breath sounds normal.  Abdominal: Soft. There is no tenderness.  Musculoskeletal:       Back:  Lymphadenopathy:    She has no cervical adenopathy.  Neurological: She is alert and oriented to person, place, and time.  Skin: Skin is warm and dry.  Psychiatric: Her speech is normal and behavior is normal. Judgment and thought content normal. Her mood appears anxious. Cognition and memory are normal. She exhibits a depressed mood.  Tearful.  Nursing note and vitals reviewed.   Assessment/Plan:   ICD-10-CM   1. Essential (primary) hypertension I10   2. Major depressive disorder, recurrent episode, moderate (HCC) F33.1 desvenlafaxine (PRISTIQ) 100 MG 24 hr tablet  3. GAD (generalized anxiety disorder) F41.1 ALPRAZolam (XANAX) 0.5 MG tablet  4. Low back pain with sciatica, sciatica laterality unspecified, unspecified back pain laterality, unspecified chronicity M54.40 oxyCODONE-acetaminophen (PERCOCET/ROXICET) 5-325 MG tablet  5. Obstructive sleep apnea, adult G47.33     1. Blood pressure stable. Continue bp medication as prescribed  2. Increase desvenlafaxine to 100mg  daily. Reassess at next visit.  3. Ok to continue alprazolam 0.5mg  as needed and as prescribed. New rx sent to pharmacy. 4. Apply lidocaine  patches to lower back and hip as needed and as prescribed. Use tizanidine 4mg  twice daily if needed to relieve tight and sore muscles. New rx for oxycodone 5/325mg  tablets given to patient. #1 tablets allowed. Patient to use only for severe pain, unrelieved by other medications.  5. Continue to use CPAP as ordered  She should follow up in 3 months and sooner if needed  General Counseling: I have discussed the findings of the evaluation and examination with Demica.  I have also discussed any further diagnostic evaluation that may be needed or ordered today. Kaizlee verbalizes understanding of the findings of todays visit. We also reviewed her medications today. she has been encouraged to call the office with any questions or concerns that should arise related to todays visit.  Reviewed risks and possible side effects associated with taking opiates and benzodiazepines. Combination of these could cause dizziness and drowsiness. Advised him not to drive or operate  machinery when taking these medications, as he could put his life and the lives of others at risk. He voiced understanding.   This patient was seen by Leretha Pol, FNP- C in Collaboration with Dr Lavera Guise as a part of collaborative care agreement    Time spent:15 minutes    Dr Lavera Guise Internal medicine

## 2017-08-15 ENCOUNTER — Ambulatory Visit: Payer: Self-pay

## 2017-08-16 ENCOUNTER — Ambulatory Visit: Payer: Self-pay | Admitting: Internal Medicine

## 2017-08-21 ENCOUNTER — Ambulatory Visit: Payer: Self-pay | Admitting: Internal Medicine

## 2017-08-22 ENCOUNTER — Other Ambulatory Visit: Payer: Self-pay

## 2017-08-23 ENCOUNTER — Other Ambulatory Visit: Payer: Self-pay | Admitting: Internal Medicine

## 2017-08-23 ENCOUNTER — Telehealth: Payer: Self-pay

## 2017-08-23 NOTE — Telephone Encounter (Signed)
-----   Message from [place] sent at 08/22/2017 10:34 AM EST ----- Sherri Rad please call this pt when I tried to explain the instructions she is only getting more confused with dosing and said her pharmacy wouldn't give her more (probaly bc she was over taking it)???? ----- Message ----- From: Ronnell [place], NP Sent: 08/22/2017  10:09 AM To: Laurie Panda  The prescription is for once daily. She should cut out the second dose. Thanks  ----- Message ----- From: Laurie Panda Sent: 08/22/2017   8:43 AM To: Ronnell [place], NP  PT CALLED CONFUSED WITH HER ANTI DEPRSSANT. WAS ON 50MG  TWICE DAY AND AT LAST APPT WE CHANGED RX AND NOW PT IS TAKING 100MG  TWICE DAY AND FEELS SIDE EFFECTS, PLEASE CHANGE OR CLARIFY WHAT PT SHOULD BE DOING WITH MEDICATION/ BR

## 2017-08-23 NOTE — Telephone Encounter (Signed)
Pt advised supposed to be take pristiq 100 1 tab daily

## 2017-08-27 NOTE — Telephone Encounter (Signed)
Refill request

## 2017-08-28 ENCOUNTER — Ambulatory Visit: Payer: Self-pay | Admitting: Obstetrics and Gynecology

## 2017-09-04 ENCOUNTER — Ambulatory Visit: Payer: Self-pay | Admitting: Internal Medicine

## 2017-09-06 ENCOUNTER — Ambulatory Visit: Payer: Self-pay | Admitting: Internal Medicine

## 2017-09-11 DIAGNOSIS — G4733 Obstructive sleep apnea (adult) (pediatric): Secondary | ICD-10-CM | POA: Diagnosis not present

## 2017-09-11 IMAGING — MG MM DIGITAL SCREENING BILAT W/ CAD
5 series · 5 of 5 positions shown · non-contrast
Comparison: Previous exam(s).

CLINICAL DATA: Screening.

EXAM:
DIGITAL SCREENING BILATERAL MAMMOGRAM WITH CAD

[L MLO]
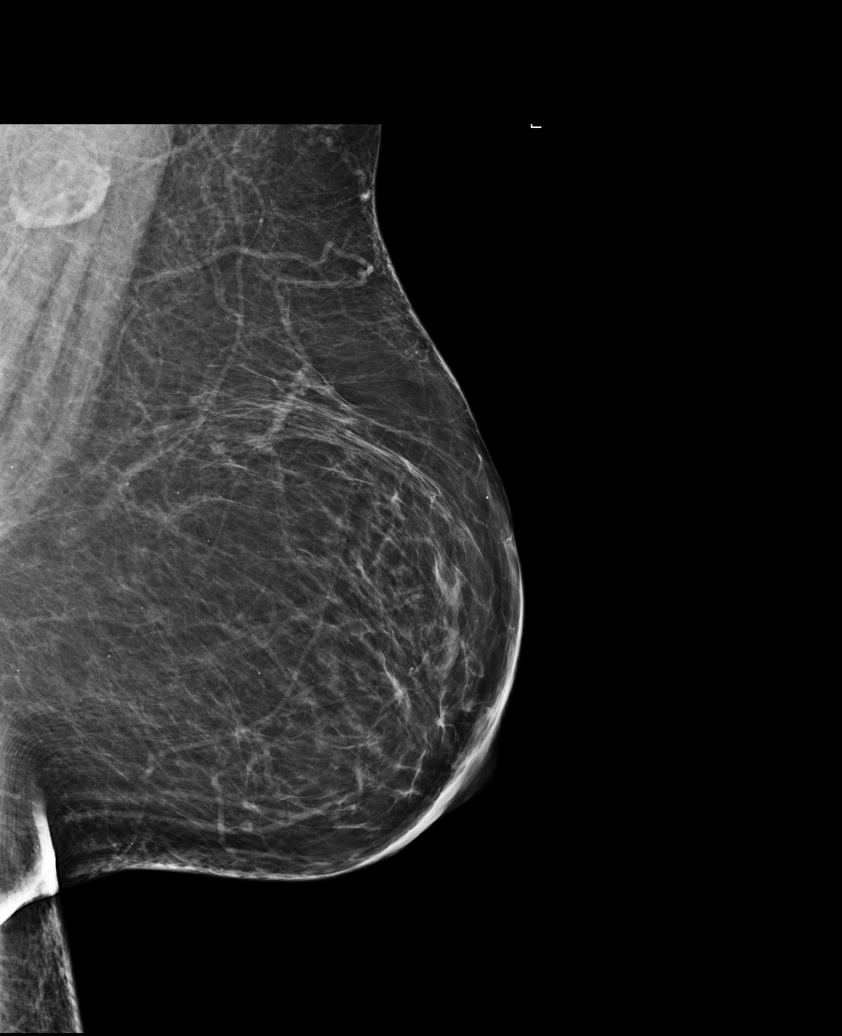

[R CC]
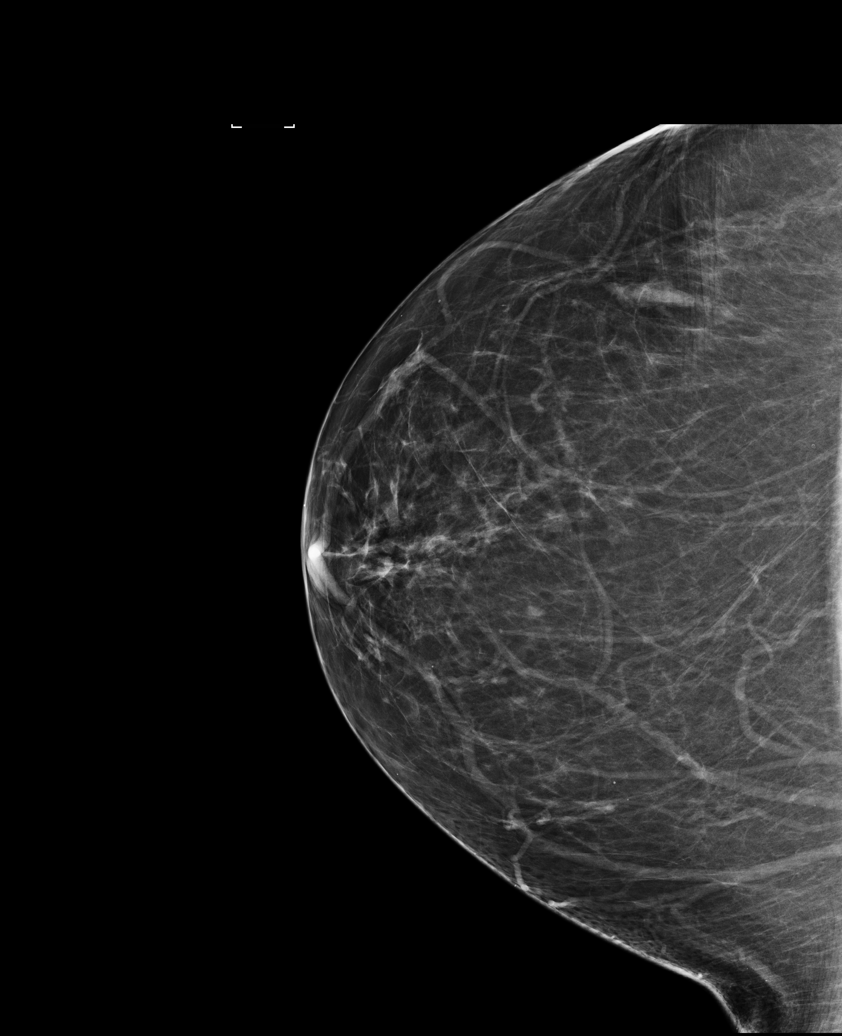

[R MLO]
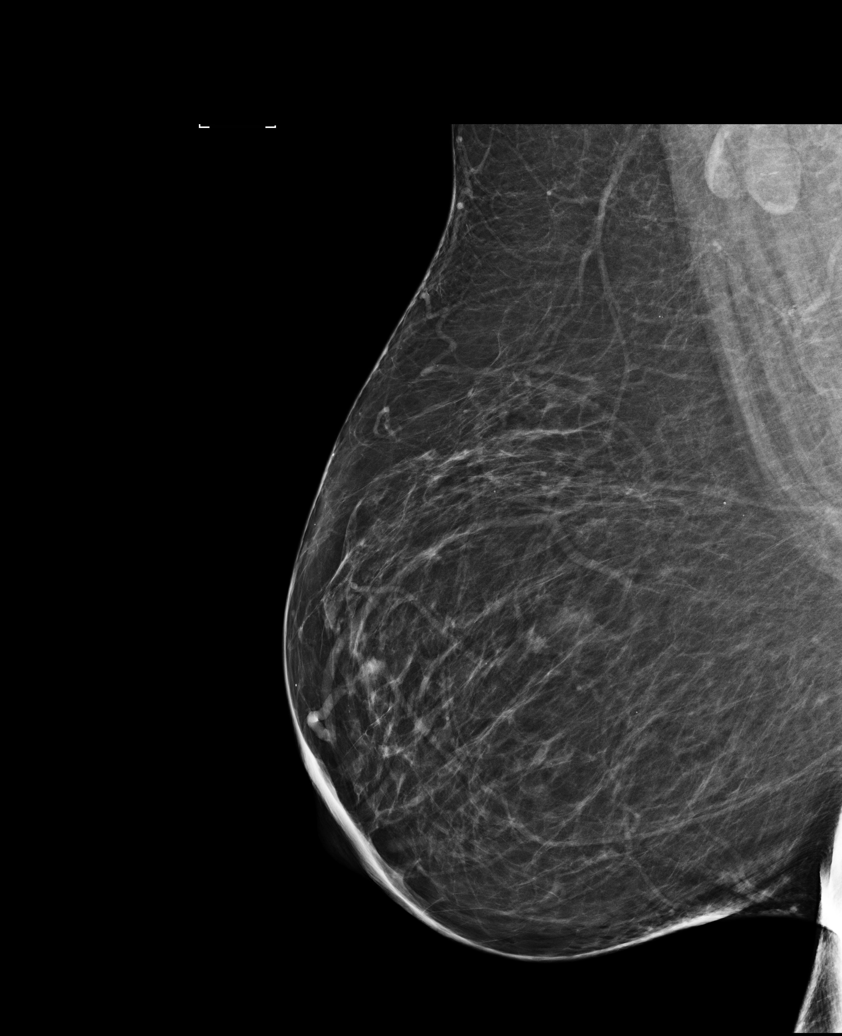

[L CC (1 of 2)]
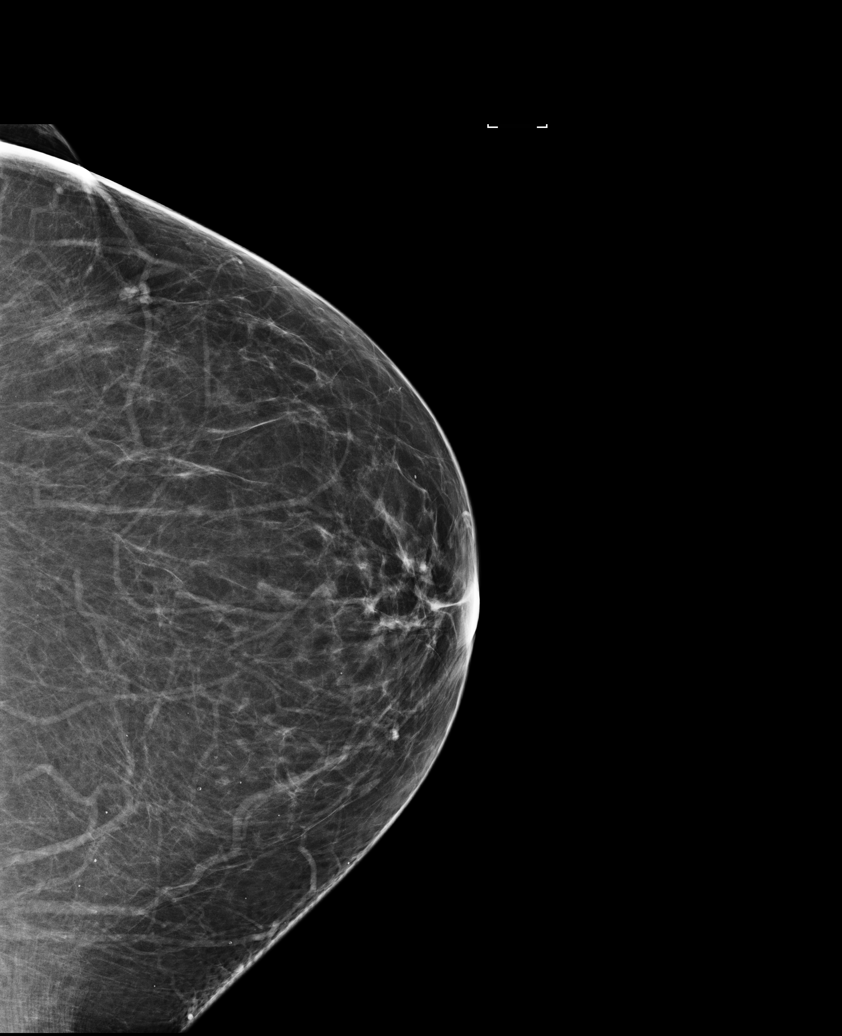

[L CC (2 of 2)]
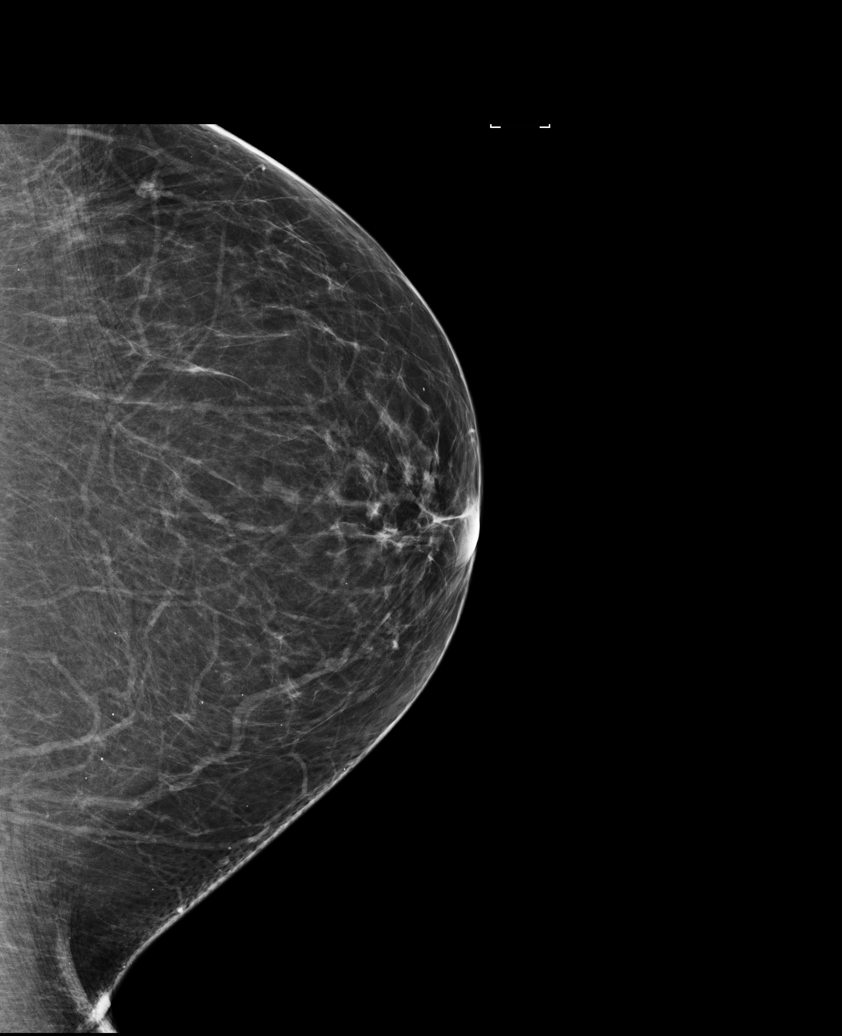

[5 of 5 positions shown; findings below may reference images not displayed]

ACR Breast Density Category b: There are scattered areas of
fibroglandular density.
FINDINGS: There are no findings suspicious for malignancy. Images were
processed with CAD.
IMPRESSION: No mammographic evidence of malignancy. A result letter of this
screening mammogram will be mailed directly to the patient.

RECOMMENDATION:
Screening mammogram in one year. (Code:AS-G-LCT)

BI-RADS CATEGORY  1: Negative.

## 2017-09-24 ENCOUNTER — Encounter: Payer: Self-pay | Admitting: Internal Medicine

## 2017-09-24 ENCOUNTER — Ambulatory Visit (INDEPENDENT_AMBULATORY_CARE_PROVIDER_SITE_OTHER): Payer: BLUE CROSS/BLUE SHIELD | Admitting: Internal Medicine

## 2017-09-24 ENCOUNTER — Other Ambulatory Visit: Payer: Self-pay

## 2017-09-24 VITALS — BP 152/86 | HR 79 | Resp 16 | Ht 69.0 in | Wt 359.0 lb

## 2017-09-24 DIAGNOSIS — J301 Allergic rhinitis due to pollen: Secondary | ICD-10-CM

## 2017-09-24 DIAGNOSIS — G4733 Obstructive sleep apnea (adult) (pediatric): Secondary | ICD-10-CM

## 2017-09-24 DIAGNOSIS — Z9989 Dependence on other enabling machines and devices: Secondary | ICD-10-CM

## 2017-09-24 NOTE — Progress Notes (Signed)
[facility] [place], [place] [address]  Pulmonary Sleep Medicine   Office Visit Note  Patient Name: Candace Clayton DOB: 06/27/1960 MRN [id number]  Date of Service: 09/24/2017  Complaints/HPI:  She is doing well at this time patient has been comfortable without distress at this time.  She has been using her CPAP as prescribed has  Excellent Compliance noted.  She is little bit forgetful she states she does not remember things as well  ROS  General: (-) fever, (-) chills, (-) night sweats, (-) weakness Skin: (-) rashes, (-) itching,. Eyes: (-) visual changes, (-) redness, (-) itching. Nose and Sinuses: (-) nasal stuffiness or itchiness, (-) postnasal drip, (-) nosebleeds, (-) sinus trouble. Mouth and Throat: (-) sore throat, (-) hoarseness. Neck: (-) swollen glands, (-) enlarged thyroid, (-) neck pain. Respiratory: - cough, (-) bloody sputum, - shortness of breath, - wheezing. Cardiovascular: - ankle swelling, (-) chest pain. Lymphatic: (-) lymph node enlargement. Neurologic: (-) numbness, (-) tingling. Psychiatric: (-) anxiety, (-) depression   Current Medication: Outpatient Encounter Medications as of 09/24/2017  Medication Sig  . ALPRAZolam (XANAX) 0.5 MG tablet Take 1 tablet (0.5 mg total) by mouth 3 (three) times daily as needed for anxiety.  . cetirizine (ZYRTEC) 10 MG tablet TAKE 1 TABLET(S) BY MOUTH DAILY FOR ALLERGIES  . desvenlafaxine (PRISTIQ) 100 MG 24 hr tablet Take 1 tablet (100 mg total) by mouth daily.  . enalapril (VASOTEC) 20 MG tablet Take 20 mg by mouth 2 (two) times daily.   Marland Kitchen gabapentin (NEURONTIN) 300 MG capsule Take 300 mg by mouth.  . hydrochlorothiazide (HYDRODIURIL) 25 MG tablet Take 25 mg by mouth daily.   Marland Kitchen lidocaine (LIDODERM) 5 % APPLY 1 PATCH TO SORE AREA FOR UP TO 12 HOURS DAILY AS NEEDED BACK PAIN  . oxyCODONE-acetaminophen (PERCOCET/ROXICET) 5-325 MG tablet Take 1 tablet by mouth 3 (three) times daily as needed for severe  pain.  Marland Kitchen tiZANidine (ZANAFLEX) 4 MG tablet Take 4 mg by mouth 2 (two) times daily as needed for muscle spasms.  Marland Kitchen venlafaxine XR (EFFEXOR-XR) 150 MG 24 hr capsule Take 150 mg by mouth.   No facility-administered encounter medications on file as of 09/24/2017.     Surgical History: Past Surgical History:  Procedure Laterality Date  . CESAREAN SECTION  1992  . FINGER SURGERY  2011    Medical History: Past Medical History:  Diagnosis Date  . Anxiety   . Depression   . History of C-section 27  . Hypertension     Family History: Family History  Problem Relation Age of Onset  . Breast cancer Mother 40       x 3 times  . Colon cancer Mother   . Brain cancer Mother   . Cancer - Colon Mother   . Cancer - Other Mother   . Heart Problems Father   . Colon cancer Brother   . Heart Problems Brother     Social History: Social History   Socioeconomic History  . Marital status: Divorced    Spouse name: Not on file  . Number of children: Not on file  . Years of education: Not on file  . Highest education level: Not on file  Occupational History  . Not on file  Social Needs  . Financial resource strain: Not on file  . Food insecurity:    Worry: Not on file    Inability: Not on file  . Transportation needs:    Medical: Not on  file    Non-medical: Not on file  Tobacco Use  . Smoking status: Never Smoker  . Smokeless tobacco: Never Used  Substance and Sexual Activity  . Alcohol use: No    Frequency: Never  . Drug use: No  . Sexual activity: Never    Birth control/protection: Post-menopausal  Lifestyle  . Physical activity:    Days per week: Not on file    Minutes per session: Not on file  . Stress: Not on file  Relationships  . Social connections:    Talks on phone: Not on file    Gets together: Not on file    Attends religious service: Not on file    Active member of club or organization: Not on file    Attends meetings of clubs or organizations: Not on file     Relationship status: Not on file  . Intimate partner violence:    Fear of current or ex partner: Not on file    Emotionally abused: Not on file    Physically abused: Not on file    Forced sexual activity: Not on file  Other Topics Concern  . Not on file  Social History Narrative  . Not on file    Vital Signs: Blood pressure (!) 152/86, pulse 79, resp. rate 16, height 5\' 9"  (1.753 m), weight (!) 359 lb (162.8 kg), SpO2 96 %.  Examination: General Appearance: The patient is well-developed, well-nourished, and in no distress. Skin: Gross inspection of skin unremarkable. Head: normocephalic, no gross deformities. Eyes: no gross deformities noted. ENT: ears appear grossly normal no exudates. Neck: Supple. No thyromegaly. No LAD. Respiratory: no rhonchi noted at this time. Cardiovascular: Normal S1 and S2 without murmur or rub. Extremities: No cyanosis. pulses are equal. Neurologic: Alert and oriented. No involuntary movements.  LABS: No results found for this or any previous visit (from the past 2160 hour(s)).  Radiology: No results found.  No results found.  No results found.    Assessment and Plan: Patient Active Problem List   Diagnosis Date Noted  . Lumbago with sciatica, unspecified side 07/03/2017  . Allergic rhinitis, unspecified 07/03/2017  . Pain in unspecified knee 07/03/2017  . Obstructive sleep apnea, adult 07/03/2017  . Sedative, hypnotic, or anxiolytic use, unspecified, uncomplicated 82/99/3716  . Hypersomnia 06/27/2017  . Major depressive disorder, recurrent episode, moderate ([place]) 06/27/2017  . Essential (primary) hypertension 06/27/2017  . Morbid obesity ([place]) 06/27/2017  . Generalized anxiety disorder 06/27/2017  . Urinary tract infection 06/27/2017    1. OSA she will continue with CPAP on the current pressures will continue to monitor closely 2. Morbid obesity needs to work on losing we will continue with supportive care 3. Allergic rhinitis  right now is using antihistamines we will continue with present therapy  General Counseling: I have discussed the findings of the evaluation and examination with Candace Clayton.  I have also discussed any further diagnostic evaluation thatmay be needed or ordered today. Basil verbalizes understanding of the findings of todays visit. We also reviewed her medications today and discussed drug interactions and side effects including but not limited excessive drowsiness and altered mental states. We also discussed that there is always a risk not just to her but also people around her. she has been encouraged to call the office with any questions or concerns that should arise related to todays visit.    Time spent: 60min  I have personally obtained a history, examined the patient, evaluated laboratory and imaging results, formulated the assessment  and plan and placed orders.    Ashlley Booher A Davier Tramell, MD FCCP Pulmonary and Critical Care Sleep medicine 

## 2017-09-24 NOTE — Patient Instructions (Signed)

## 2017-10-04 ENCOUNTER — Ambulatory Visit: Payer: BLUE CROSS/BLUE SHIELD | Admitting: Nurse Practitioner

## 2017-10-04 VITALS — BP 140/82 | HR 83 | Resp 16 | Ht 69.0 in | Wt 367.4 lb

## 2017-10-04 DIAGNOSIS — F331 Major depressive disorder, recurrent, moderate: Secondary | ICD-10-CM

## 2017-10-04 DIAGNOSIS — I10 Essential (primary) hypertension: Secondary | ICD-10-CM

## 2017-10-04 DIAGNOSIS — M5440 Lumbago with sciatica, unspecified side: Secondary | ICD-10-CM

## 2017-10-04 DIAGNOSIS — F411 Generalized anxiety disorder: Secondary | ICD-10-CM

## 2017-10-04 DIAGNOSIS — Z1239 Encounter for other screening for malignant neoplasm of breast: Secondary | ICD-10-CM

## 2017-10-04 DIAGNOSIS — Z1231 Encounter for screening mammogram for malignant neoplasm of breast: Secondary | ICD-10-CM | POA: Diagnosis not present

## 2017-10-04 MED ORDER — TIZANIDINE HCL 4 MG PO TABS: 4.0000 mg | ORAL_TABLET | Freq: Two times a day (BID) | ORAL | 2 refills | Status: DC | PRN

## 2017-10-04 MED ORDER — OXYCODONE-ACETAMINOPHEN 5-325 MG PO TABS: 1.0000 | ORAL_TABLET | ORAL | 0 refills | Status: DC | PRN

## 2017-10-04 MED ORDER — ALPRAZOLAM 0.5 MG PO TABS: 0.5000 mg | ORAL_TABLET | Freq: Three times a day (TID) | ORAL | 3 refills | Status: DC | PRN

## 2017-10-04 MED ORDER — DESVENLAFAXINE SUCCINATE ER 100 MG PO TB24: 100.0000 mg | ORAL_TABLET | Freq: Every day | ORAL | 3 refills | Status: DC

## 2017-10-04 MED ORDER — ENALAPRIL MALEATE 20 MG PO TABS: 20.0000 mg | ORAL_TABLET | Freq: Two times a day (BID) | ORAL | 5 refills | Status: DC

## 2017-10-04 MED ORDER — HYDROCHLOROTHIAZIDE 25 MG PO TABS: 25.0000 mg | ORAL_TABLET | Freq: Every day | ORAL | 5 refills | Status: DC

## 2017-10-04 NOTE — Progress Notes (Signed)
[facility] [place], [place] [address]  Internal MEDICINE  Office Visit Note  Patient Name: Candace Clayton  [id number]  [id number]  Date of Service: 10/24/2017   Pt is here for routine follow up.   Chief Complaint  Patient presents with  . Hypertension  . Depression    The patient is here for routine follow up. She does note that her depression has worsened some. Hard to find the energy and motivation to get out of bed on her days off. Feels like there is a great deal of work place bullying going on and she is the focus of the bullying. Becomes easily tearful when taling about her workplace situation. Changed her pristiq to 100mg  daily at her last visit. Still taking alprazolam 0.5mg  three times daily. Overlal, she feels like this helps keep some emotional balance in her day.  Continues to have right sided lower back pain, radiating into the right hip and right upper leg. She does need to have refills of her pain medication, which she does take when needed       Current Medication: Outpatient Encounter Medications as of 10/04/2017  Medication Sig  . ALPRAZolam (XANAX) 0.5 MG tablet Take 1 tablet (0.5 mg total) by mouth 3 (three) times daily as needed for anxiety.  Marland Kitchen desvenlafaxine (PRISTIQ) 100 MG 24 hr tablet Take 1 tablet (100 mg total) by mouth daily.  . enalapril (VASOTEC) 20 MG tablet Take 1 tablet (20 mg total) by mouth 2 (two) times daily.  . hydrochlorothiazide (HYDRODIURIL) 25 MG tablet Take 1 tablet (25 mg total) by mouth daily.  Marland Kitchen lidocaine (LIDODERM) 5 % APPLY 1 PATCH TO SORE AREA FOR UP TO 12 HOURS DAILY AS NEEDED BACK PAIN  . oxyCODONE-acetaminophen (PERCOCET/ROXICET) 5-325 MG tablet Take 1 tablet by mouth every 4 (four) hours as needed for severe pain.  . [DISCONTINUED] ALPRAZolam (XANAX) 0.5 MG tablet Take 1 tablet (0.5 mg total) by mouth 3 (three) times daily as needed for anxiety.  . [DISCONTINUED] cetirizine (ZYRTEC) 10 MG tablet TAKE 1  TABLET(S) BY MOUTH DAILY FOR ALLERGIES  . [DISCONTINUED] desvenlafaxine (PRISTIQ) 100 MG 24 hr tablet Take 1 tablet (100 mg total) by mouth daily.  . [DISCONTINUED] enalapril (VASOTEC) 20 MG tablet Take 20 mg by mouth 2 (two) times daily.   . [DISCONTINUED] gabapentin (NEURONTIN) 300 MG capsule Take 300 mg by mouth.  . [DISCONTINUED] hydrochlorothiazide (HYDRODIURIL) 25 MG tablet Take 25 mg by mouth daily.   . [DISCONTINUED] oxyCODONE-acetaminophen (PERCOCET/ROXICET) 5-325 MG tablet Take 1 tablet by mouth 3 (three) times daily as needed for severe pain.  . [DISCONTINUED] venlafaxine XR (EFFEXOR-XR) 150 MG 24 hr capsule Take 150 mg by mouth.  Marland Kitchen tiZANidine (ZANAFLEX) 4 MG tablet Take 1 tablet (4 mg total) by mouth 2 (two) times daily as needed for muscle spasms.  . [DISCONTINUED] tiZANidine (ZANAFLEX) 4 MG tablet Take 4 mg by mouth 2 (two) times daily as needed for muscle spasms.   No facility-administered encounter medications on file as of 10/04/2017.     Surgical History: Past Surgical History:  Procedure Laterality Date  . CESAREAN SECTION  1992  . FINGER SURGERY  2011    Medical History: Past Medical History:  Diagnosis Date  . Anxiety   . Depression   . History of C-section 64  . Hypertension     Family History: Family History  Problem Relation Age of Onset  . Breast cancer Mother 62  x 3 times  . Colon cancer Mother   . Brain cancer Mother   . Cancer - Colon Mother   . Cancer - Other Mother   . Heart Problems Father   . Colon cancer Brother   . Heart Problems Brother     Social History   Socioeconomic History  . Marital status: Divorced    Spouse name: Not on file  . Number of children: Not on file  . Years of education: Not on file  . Highest education level: Not on file  Occupational History  . Not on file  Social Needs  . Financial resource strain: Not on file  . Food insecurity:    Worry: Not on file    Inability: Not on file  . Transportation  needs:    Medical: Not on file    Non-medical: Not on file  Tobacco Use  . Smoking status: Never Smoker  . Smokeless tobacco: Never Used  Substance and Sexual Activity  . Alcohol use: No    Frequency: Never  . Drug use: No  . Sexual activity: Never    Birth control/protection: Post-menopausal  Lifestyle  . Physical activity:    Days per week: Not on file    Minutes per session: Not on file  . Stress: Not on file  Relationships  . Social connections:    Talks on phone: Not on file    Gets together: Not on file    Attends religious service: Not on file    Active member of club or organization: Not on file    Attends meetings of clubs or organizations: Not on file    Relationship status: Not on file  . Intimate partner violence:    Fear of current or ex partner: Not on file    Emotionally abused: Not on file    Physically abused: Not on file    Forced sexual activity: Not on file  Other Topics Concern  . Not on file  Social History Narrative  . Not on file      Review of Systems  Constitutional: Positive for fatigue. Negative for activity change, appetite change and unexpected weight change.  HENT: Negative for congestion, postnasal drip, rhinorrhea and sore throat.   Eyes: Negative.   Respiratory: Negative for chest tightness, shortness of breath and wheezing.   Cardiovascular: Negative for chest pain and palpitations.  Gastrointestinal: Negative for abdominal pain, constipation, diarrhea, nausea and vomiting.  Endocrine: Negative for cold intolerance, heat intolerance, polydipsia and polyphagia.  Genitourinary: Negative for dysuria, flank pain, frequency and urgency.  Musculoskeletal: Positive for arthralgias, back pain and myalgias.  Skin: Negative for rash.  Allergic/Immunologic: Negative for environmental allergies.  Neurological: Positive for headaches. Negative for weakness and numbness.  Hematological: Negative for adenopathy.  Psychiatric/Behavioral: Positive  for behavioral problems and sleep disturbance. The patient is nervous/anxious.    Today's Vitals   10/04/17 0925  BP: 140/82  Pulse: 83  Resp: 16  SpO2: 98%  Weight: (!) 367 lb 6.4 oz (166.7 kg)  Height: 5\' 9"  (1.753 m)    Physical Exam  Constitutional: She is oriented to person, place, and time. She appears well-developed and well-nourished.  HENT:  Head: Normocephalic and atraumatic.  Eyes: Pupils are equal, round, and reactive to light. Conjunctivae and EOM are normal.  Neck: Normal range of motion. Neck supple. No JVD present. No thyromegaly present.  Cardiovascular: Normal rate, regular rhythm and normal heart sounds.  Pulmonary/Chest: Effort normal and breath sounds normal. She  has no wheezes.  Abdominal: Soft. Bowel sounds are normal.  Musculoskeletal:       Back:  Lymphadenopathy:    She has no cervical adenopathy.  Neurological: She is alert and oriented to person, place, and time. No cranial nerve deficit.  Skin: Skin is warm and dry. Capillary refill takes 2 to 3 seconds.  Psychiatric: Her speech is normal and behavior is normal. Judgment and thought content normal. Her mood appears anxious. Cognition and memory are normal. She exhibits a depressed mood.  Tearful.  Nursing note and vitals reviewed.   Assessment/Plan: 1. Essential hypertension Stable. Continue bp medication as prescribed.  - enalapril (VASOTEC) 20 MG tablet; Take 1 tablet (20 mg total) by mouth 2 (two) times daily.  Dispense: 60 tablet; Refill: 5 - hydrochlorothiazide (HYDRODIURIL) 25 MG tablet; Take 1 tablet (25 mg total) by mouth daily.  Dispense: 30 tablet; Refill: 5  2. Low back pain with sciatica, sciatica laterality unspecified, unspecified back pain laterality, unspecified chronicity Getting worse and more persistent. Will get x-ray of lumbar spine for further evaluation. Continue tinazadine 4mg  twice daily for sore and tight muscles. May use oxycodone/APAP 5/325mg  tablets every 4 hours as  needed. Of note, she uses this once daily, if that, and only when needed. A new rx for #30 tablets was sent to her pharmacy.  - DG Lumbar Spine Complete; Future - oxyCODONE-acetaminophen (PERCOCET/ROXICET) 5-325 MG tablet; Take 1 tablet by mouth every 4 (four) hours as needed for severe pain.  Dispense: 30 tablet; Refill: 0 - tiZANidine (ZANAFLEX) 4 MG tablet; Take 1 tablet (4 mg total) by mouth 2 (two) times daily as needed for muscle spasms.  Dispense: 60 tablet; Refill: 2  3. GAD (generalized anxiety disorder) May continue alprazolam 0.5mg  TID prn acute anxiety. - ALPRAZolam (XANAX) 0.5 MG tablet; Take 1 tablet (0.5 mg total) by mouth 3 (three) times daily as needed for anxiety.  Dispense: 90 tablet; Refill: 3  4. Major depressive disorder, recurrent episode, moderate ([facility]) Continue pristiq 100mg  daily.  - desvenlafaxine (PRISTIQ) 100 MG 24 hr tablet; Take 1 tablet (100 mg total) by mouth daily.  Dispense: 30 tablet; Refill: 3  5. Screening for breast cancer - MM DIGITAL SCREENING BILATERAL; Future  General Counseling: Chava verbalizes understanding of the findings of todays visit and agrees with plan of treatment. I have discussed any further diagnostic evaluation that may be needed or ordered today. We also reviewed her medications today. she has been encouraged to call the office with any questions or concerns that should arise related to todays visit.  Reviewed risks and possible side effects associated with taking opiates and benzodiazepines. Combination of these could cause dizziness and drowsiness. Advised him not to drive or operate machinery when taking these medications, as he could put his life and the lives of others at risk. He voiced understanding.   This patient was seen by Leretha Pol, FNP- C in Collaboration with Dr Lavera Guise as a part of collaborative care agreement    Orders Placed This Encounter  Procedures  . DG Lumbar Spine Complete  . MM DIGITAL SCREENING  BILATERAL    Meds ordered this encounter  Medications  . oxyCODONE-acetaminophen (PERCOCET/ROXICET) 5-325 MG tablet    Sig: Take 1 tablet by mouth every 4 (four) hours as needed for severe pain.    Dispense:  30 tablet    Refill:  0    Please note change in dosing.    Order Specific Question:   Supervising  Provider    Answer:   Lavera Guise [[id number]]  . ALPRAZolam (XANAX) 0.5 MG tablet    Sig: Take 1 tablet (0.5 mg total) by mouth 3 (three) times daily as needed for anxiety.    Dispense:  90 tablet    Refill:  3    Order Specific Question:   Supervising Provider    Answer:   Lavera Guise [[id number]]  . desvenlafaxine (PRISTIQ) 100 MG 24 hr tablet    Sig: Take 1 tablet (100 mg total) by mouth daily.    Dispense:  30 tablet    Refill:  3    Increased dose    Order Specific Question:   Supervising Provider    Answer:   Lavera Guise [[id number]]  . enalapril (VASOTEC) 20 MG tablet    Sig: Take 1 tablet (20 mg total) by mouth 2 (two) times daily.    Dispense:  60 tablet    Refill:  5    Order Specific Question:   Supervising Provider    Answer:   Lavera Guise [[id number]]  . hydrochlorothiazide (HYDRODIURIL) 25 MG tablet    Sig: Take 1 tablet (25 mg total) by mouth daily.    Dispense:  30 tablet    Refill:  5    Order Specific Question:   Supervising Provider    Answer:   Lavera Guise [[id number]]  . tiZANidine (ZANAFLEX) 4 MG tablet    Sig: Take 1 tablet (4 mg total) by mouth 2 (two) times daily as needed for muscle spasms.    Dispense:  60 tablet    Refill:  2    Order Specific Question:   Supervising Provider    Answer:   Lavera Guise [[id number]]    Time spent: 65 Minutes     Dr Lavera Guise Internal medicine

## 2017-10-10 ENCOUNTER — Other Ambulatory Visit: Payer: Self-pay | Admitting: Internal Medicine

## 2017-10-24 ENCOUNTER — Encounter: Payer: Self-pay | Admitting: Nurse Practitioner

## 2017-10-24 DIAGNOSIS — Z1211 Encounter for screening for malignant neoplasm of colon: Secondary | ICD-10-CM | POA: Insufficient documentation

## 2017-10-24 DIAGNOSIS — Z1239 Encounter for other screening for malignant neoplasm of breast: Secondary | ICD-10-CM

## 2017-10-26 ENCOUNTER — Ambulatory Visit
Admission: RE | Admit: 2017-10-26 | Discharge: 2017-10-26 | Disposition: A | Discharge status: Home / Self Care | Payer: BLUE CROSS/BLUE SHIELD | Source: Ambulatory Visit | Attending: Nurse Practitioner | Admitting: Nurse Practitioner

## 2017-10-26 DIAGNOSIS — M5440 Lumbago with sciatica, unspecified side: Secondary | ICD-10-CM | POA: Insufficient documentation

## 2017-10-26 DIAGNOSIS — Z1231 Encounter for screening mammogram for malignant neoplasm of breast: Secondary | ICD-10-CM | POA: Insufficient documentation

## 2017-10-26 DIAGNOSIS — M545 Low back pain: Secondary | ICD-10-CM | POA: Diagnosis not present

## 2017-10-26 DIAGNOSIS — Z1239 Encounter for other screening for malignant neoplasm of breast: Secondary | ICD-10-CM

## 2017-11-16 ENCOUNTER — Telehealth: Payer: Self-pay | Admitting: Nurse Practitioner

## 2017-11-16 NOTE — Telephone Encounter (Signed)
Called pt and left a message on voicemail to call back for test results

## 2017-11-30 ENCOUNTER — Telehealth: Payer: Self-pay | Admitting: Nurse Practitioner

## 2017-11-30 NOTE — Telephone Encounter (Signed)
done

## 2017-11-30 NOTE — Telephone Encounter (Signed)
-----   Message from Edd Arbour, [place] sent at 11/16/2017 10:51 AM EDT -----   ----- Message ----- From: Ronnell Freshwater, NP Sent: 11/15/2017   5:43 PM To: Edd Arbour, CMA  Please let the patient know that x-ray of lumbar spien did show some degenerative changes, but nothing acute, no fractures. If pain is persistent, we should get MRI. I can get this set up if she wants. Thanks.

## 2017-12-04 ENCOUNTER — Ambulatory Visit: Payer: Self-pay | Admitting: Nurse Practitioner

## 2017-12-04 ENCOUNTER — Encounter: Payer: Self-pay | Admitting: Nurse Practitioner

## 2017-12-04 VITALS — BP 140/70 | HR 80 | Resp 16 | Ht 69.0 in | Wt 364.8 lb

## 2017-12-04 DIAGNOSIS — M5440 Lumbago with sciatica, unspecified side: Secondary | ICD-10-CM | POA: Diagnosis not present

## 2017-12-04 DIAGNOSIS — I10 Essential (primary) hypertension: Secondary | ICD-10-CM

## 2017-12-04 DIAGNOSIS — F331 Major depressive disorder, recurrent, moderate: Secondary | ICD-10-CM

## 2017-12-04 DIAGNOSIS — M5136 Other intervertebral disc degeneration, lumbar region: Secondary | ICD-10-CM

## 2017-12-04 DIAGNOSIS — F411 Generalized anxiety disorder: Secondary | ICD-10-CM

## 2017-12-04 MED ORDER — PREDNISONE 10 MG (21) PO TBPK: ORAL_TABLET | ORAL | 0 refills | Status: DC

## 2017-12-04 MED ORDER — OXYCODONE-ACETAMINOPHEN 5-325 MG PO TABS: 1.0000 | ORAL_TABLET | ORAL | 0 refills | Status: DC | PRN

## 2017-12-04 NOTE — Progress Notes (Signed)
[facility] [place], [place] [address]  Internal MEDICINE  Office Visit Note  Patient Name: Candace Clayton  [id number]  [id number]  Date of Service: 12/23/2017   Pt is here for routine follow up.   Chief Complaint  Patient presents with  . Back Pain    with severe sciatica on right side     Continues to have right sided lower back pain, radiating into the right hip and right upper leg. She had x-ray  Of the lumbar spie since her last visit. Did show facet hypertrophy in lumbar spine as well and enterolithiasis ata L4/L5. She does need to have refills of her pain medication, which she does take when needed.        Current Medication: Outpatient Encounter Medications as of 12/04/2017  Medication Sig  . ALPRAZolam (XANAX) 0.5 MG tablet Take 1 tablet (0.5 mg total) by mouth 3 (three) times daily as needed for anxiety.  . cetirizine (ZYRTEC) 10 MG tablet TAKE 1 TABLET(S) BY MOUTH DAILY FOR ALLERGIES  . desvenlafaxine (PRISTIQ) 100 MG 24 hr tablet Take 1 tablet (100 mg total) by mouth daily.  . enalapril (VASOTEC) 20 MG tablet Take 1 tablet (20 mg total) by mouth 2 (two) times daily.  . hydrochlorothiazide (HYDRODIURIL) 25 MG tablet Take 1 tablet (25 mg total) by mouth daily.  Marland Kitchen oxyCODONE-acetaminophen (PERCOCET/ROXICET) 5-325 MG tablet Take 1 tablet by mouth every 4 (four) hours as needed for severe pain.  . predniSONE (STERAPRED UNI-PAK 21 TAB) 10 MG (21) TBPK tablet 6 day taper - take by mouth as directed for 6 days  . tiZANidine (ZANAFLEX) 4 MG tablet Take 1 tablet (4 mg total) by mouth 2 (two) times daily as needed for muscle spasms.  . [DISCONTINUED] gabapentin (NEURONTIN) 300 MG capsule Take 300 mg by mouth.  . [DISCONTINUED] lidocaine (LIDODERM) 5 % APPLY 1 PATCH TO SORE AREA FOR UP TO 12 HOURS DAILY AS NEEDED BACK PAIN  . [DISCONTINUED] oxyCODONE-acetaminophen (PERCOCET/ROXICET) 5-325 MG tablet Take 1 tablet by mouth every 4 (four) hours as needed for  severe pain.   No facility-administered encounter medications on file as of 12/04/2017.     Surgical History: Past Surgical History:  Procedure Laterality Date  . CESAREAN SECTION  1992  . FINGER SURGERY  2011    Medical History: Past Medical History:  Diagnosis Date  . Anxiety   . Depression   . History of C-section 86  . Hypertension     Family History: Family History  Problem Relation Age of Onset  . Breast cancer Mother 40       x 3 times  . Colon cancer Mother   . Brain cancer Mother   . Cancer - Colon Mother   . Cancer - Other Mother   . Heart Problems Father   . Colon cancer Brother   . Heart Problems Brother     Social History   Socioeconomic History  . Marital status: Divorced    Spouse name: Not on file  . Number of children: Not on file  . Years of education: Not on file  . Highest education level: Not on file  Occupational History  . Not on file  Social Needs  . Financial resource strain: Not on file  . Food insecurity:    Worry: Not on file    Inability: Not on file  . Transportation needs:    Medical: Not on file    Non-medical: Not on file  Tobacco Use  . Smoking status: Never Smoker  . Smokeless tobacco: Never Used  Substance and Sexual Activity  . Alcohol use: No    Frequency: Never  . Drug use: No  . Sexual activity: Never    Birth control/protection: Post-menopausal  Lifestyle  . Physical activity:    Days per week: Not on file    Minutes per session: Not on file  . Stress: Not on file  Relationships  . Social connections:    Talks on phone: Not on file    Gets together: Not on file    Attends religious service: Not on file    Active member of club or organization: Not on file    Attends meetings of clubs or organizations: Not on file    Relationship status: Not on file  . Intimate partner violence:    Fear of current or ex partner: Not on file    Emotionally abused: Not on file    Physically abused: Not on file     Forced sexual activity: Not on file  Other Topics Concern  . Not on file  Social History Narrative  . Not on file      Review of Systems  Constitutional: Positive for activity change and fatigue. Negative for appetite change and unexpected weight change.  HENT: Negative for congestion, postnasal drip, rhinorrhea and sore throat.   Eyes: Negative.   Respiratory: Negative for chest tightness, shortness of breath and wheezing.   Cardiovascular: Negative for chest pain and palpitations.  Gastrointestinal: Negative for abdominal pain, constipation, diarrhea, nausea and vomiting.  Endocrine: Negative for cold intolerance, heat intolerance, polydipsia, polyphagia and polyuria.  Genitourinary: Negative for dysuria, flank pain, frequency and urgency.  Musculoskeletal: Positive for arthralgias, back pain and myalgias.  Skin: Negative for rash.  Allergic/Immunologic: Negative for environmental allergies.  Neurological: Positive for headaches. Negative for weakness and numbness.  Hematological: Negative for adenopathy.  Psychiatric/Behavioral: Positive for behavioral problems and sleep disturbance. The patient is nervous/anxious.     Today's Vitals   12/04/17 1509  BP: 140/70  Pulse: 80  Resp: 16  SpO2: 97%  Weight: (!) 364 lb 12.8 oz (165.5 kg)  Height: 5\' 9"  (1.753 m)    Physical Exam  Constitutional: She is oriented to person, place, and time. She appears well-developed and well-nourished.  HENT:  Head: Normocephalic and atraumatic.  Nose: Nose normal.  Eyes: Pupils are equal, round, and reactive to light. Conjunctivae and EOM are normal.  Neck: Normal range of motion. Neck supple. No JVD present. No tracheal deviation present. No thyromegaly present.  Cardiovascular: Normal rate, regular rhythm and normal heart sounds.  Pulmonary/Chest: Effort normal and breath sounds normal. She has no wheezes.  Abdominal: Soft. Bowel sounds are normal. There is no tenderness.  Musculoskeletal:        Back:  Lymphadenopathy:    She has no cervical adenopathy.  Neurological: She is alert and oriented to person, place, and time. No cranial nerve deficit.  Skin: Skin is warm and dry. Capillary refill takes 2 to 3 seconds.  Psychiatric: Her speech is normal and behavior is normal. Judgment and thought content normal. Her mood appears anxious. Cognition and memory are normal. She exhibits a depressed mood.  Tearful.  Nursing note and vitals reviewed.  Assessment/Plan: 1. Low back pain with sciatica, sciatica laterality unspecified, unspecified back pain laterality, unspecified chronicity Worsening recently. Add prednisone 10mg  dose pack. Take as directed for 6 days. May take oxycodone/APAP 5/325mg  tablets as needed and  as prescribed for severe pain. New prescription for #40 tablets sent to her pharmacy.  - oxyCODONE-acetaminophen (PERCOCET/ROXICET) 5-325 MG tablet; Take 1 tablet by mouth every 4 (four) hours as needed for severe pain.  Dispense: 40 tablet; Refill: 0 - predniSONE (STERAPRED UNI-PAK 21 TAB) 10 MG (21) TBPK tablet; 6 day taper - take by mouth as directed for 6 days  Dispense: 21 tablet; Refill: 0  2. Other intervertebral disc degeneration, lumbar region Will get MRI of lumbar spine for further evaluation. Prednisone 10mg  dose pack. Take as directed for 6 days - MR Lumbar Spine Wo Contrast; Future - predniSONE (STERAPRED UNI-PAK 21 TAB) 10 MG (21) TBPK tablet; 6 day taper - take by mouth as directed for 6 days  Dispense: 21 tablet; Refill: 0  3. Essential hypertension Stable. Continue bp medication as precribed.   4. Major depressive disorder, recurrent episode, moderate (HCC) Stable. Continue pristiq as prescribed.   5. GAD (generalized anxiety disorder) May take alprazolam 0.5mg  up to three times daily if needed for acute anxiety  General Counseling: Pattijo verbalizes understanding of the findings of todays visit and agrees with plan of treatment. I have discussed any  further diagnostic evaluation that may be needed or ordered today. We also reviewed her medications today. she has been encouraged to call the office with any questions or concerns that should arise related to todays visit.    Counseling:  This patient was seen by Leretha Pol, FNP- C in Collaboration with Dr Lavera Guise as a part of collaborative care agreement  Orders Placed This Encounter  Procedures  . MR Lumbar Spine Wo Contrast    Meds ordered this encounter  Medications  . oxyCODONE-acetaminophen (PERCOCET/ROXICET) 5-325 MG tablet    Sig: Take 1 tablet by mouth every 4 (four) hours as needed for severe pain.    Dispense:  40 tablet    Refill:  0    Please note change in dosing.    Order Specific Question:   Supervising Provider    Answer:   Lavera Guise [[id number]]  . predniSONE (STERAPRED UNI-PAK 21 TAB) 10 MG (21) TBPK tablet    Sig: 6 day taper - take by mouth as directed for 6 days    Dispense:  21 tablet    Refill:  0    Order Specific Question:   Supervising Provider    Answer:   Lavera Guise [[id number]]    Time spent: 43 Minutes     Dr Lavera Guise Internal medicine

## 2017-12-05 ENCOUNTER — Other Ambulatory Visit: Payer: Self-pay

## 2017-12-05 MED ORDER — LIDOCAINE 5 % EX PTCH: MEDICATED_PATCH | CUTANEOUS | 2 refills | Status: DC

## 2017-12-11 DIAGNOSIS — G4733 Obstructive sleep apnea (adult) (pediatric): Secondary | ICD-10-CM | POA: Diagnosis not present

## 2017-12-19 ENCOUNTER — Ambulatory Visit: Payer: BLUE CROSS/BLUE SHIELD

## 2017-12-23 ENCOUNTER — Encounter: Payer: Self-pay | Admitting: Nurse Practitioner

## 2017-12-23 DIAGNOSIS — M51369 Other intervertebral disc degeneration, lumbar region without mention of lumbar back pain or lower extremity pain: Secondary | ICD-10-CM | POA: Insufficient documentation

## 2017-12-23 DIAGNOSIS — M5136 Other intervertebral disc degeneration, lumbar region: Secondary | ICD-10-CM | POA: Insufficient documentation

## 2017-12-31 ENCOUNTER — Ambulatory Visit: Payer: Self-pay | Admitting: Internal Medicine

## 2018-01-10 ENCOUNTER — Telehealth: Payer: Self-pay

## 2018-01-10 ENCOUNTER — Encounter: Payer: Self-pay | Admitting: Adult Health

## 2018-01-10 ENCOUNTER — Ambulatory Visit (INDEPENDENT_AMBULATORY_CARE_PROVIDER_SITE_OTHER): Payer: Self-pay | Admitting: Adult Health

## 2018-01-10 VITALS — BP 145/82 | HR 72 | Resp 16 | Ht 65.0 in | Wt 359.0 lb

## 2018-01-10 DIAGNOSIS — G4733 Obstructive sleep apnea (adult) (pediatric): Secondary | ICD-10-CM | POA: Diagnosis not present

## 2018-01-10 DIAGNOSIS — Z9989 Dependence on other enabling machines and devices: Secondary | ICD-10-CM

## 2018-01-10 NOTE — Progress Notes (Signed)
[facility] [place], [place] [address]  Internal MEDICINE  Office Visit Note  Patient Name: Candace Clayton  [id number]  [id number]  Date of Service: 01/10/2018  Chief Complaint  Patient presents with  . Sleep Apnea    CPAP    HPI Pt here for follow up.  She has history of OSA and morbid obesity.  She reports she is using her CPAP even when she naps.  She reports using CPAP every night and denies needs or concerns.  Patient is here for routine follow up for obstructive sleep apnea. Patient is on CPAP of   11 cm of H2O Compliance with CPAP is good.  Sleeps well without aide. Patient will have sleep clinic follow up for download.  No issues with mask or tubing placement.   Current Medication: Outpatient Encounter Medications as of 01/10/2018  Medication Sig  . ALPRAZolam (XANAX) 0.5 MG tablet Take 1 tablet (0.5 mg total) by mouth 3 (three) times daily as needed for anxiety.  . cetirizine (ZYRTEC) 10 MG tablet TAKE 1 TABLET(S) BY MOUTH DAILY FOR ALLERGIES  . desvenlafaxine (PRISTIQ) 100 MG 24 hr tablet Take 1 tablet (100 mg total) by mouth daily.  . enalapril (VASOTEC) 20 MG tablet Take 1 tablet (20 mg total) by mouth 2 (two) times daily.  . hydrochlorothiazide (HYDRODIURIL) 25 MG tablet Take 1 tablet (25 mg total) by mouth daily.  Marland Kitchen lidocaine (LIDODERM) 5 % Remove & Discard patch within 12 hours or as directed by MD  . oxyCODONE-acetaminophen (PERCOCET/ROXICET) 5-325 MG tablet Take 1 tablet by mouth every 4 (four) hours as needed for severe pain.  . predniSONE (STERAPRED UNI-PAK 21 TAB) 10 MG (21) TBPK tablet 6 day taper - take by mouth as directed for 6 days  . tiZANidine (ZANAFLEX) 4 MG tablet Take 1 tablet (4 mg total) by mouth 2 (two) times daily as needed for muscle spasms.  . [DISCONTINUED] gabapentin (NEURONTIN) 300 MG capsule Take 300 mg by mouth.   No facility-administered encounter medications on file as of 01/10/2018.     Surgical  History: Past Surgical History:  Procedure Laterality Date  . CESAREAN SECTION  1992  . FINGER SURGERY  2011    Medical History: Past Medical History:  Diagnosis Date  . Anxiety   . Depression   . History of C-section 68  . Hypertension     Family History: Family History  Problem Relation Age of Onset  . Breast cancer Mother 40       x 3 times  . Colon cancer Mother   . Brain cancer Mother   . Cancer - Colon Mother   . Cancer - Other Mother   . Heart Problems Father   . Colon cancer Brother   . Heart Problems Brother     Social History   Socioeconomic History  . Marital status: Divorced    Spouse name: Not on file  . Number of children: Not on file  . Years of education: Not on file  . Highest education level: Not on file  Occupational History  . Not on file  Social Needs  . Financial resource strain: Not on file  . Food insecurity:    Worry: Not on file    Inability: Not on file  . Transportation needs:    Medical: Not on file    Non-medical: Not on file  Tobacco Use  . Smoking status: Never Smoker  . Smokeless tobacco: Never Used  Substance and  Sexual Activity  . Alcohol use: No    Frequency: Never  . Drug use: No  . Sexual activity: Never    Birth control/protection: Post-menopausal  Lifestyle  . Physical activity:    Days per week: Not on file    Minutes per session: Not on file  . Stress: Not on file  Relationships  . Social connections:    Talks on phone: Not on file    Gets together: Not on file    Attends religious service: Not on file    Active member of club or organization: Not on file    Attends meetings of clubs or organizations: Not on file    Relationship status: Not on file  . Intimate partner violence:    Fear of current or ex partner: Not on file    Emotionally abused: Not on file    Physically abused: Not on file    Forced sexual activity: Not on file  Other Topics Concern  . Not on file  Social History Narrative  .  Not on file    Review of Systems  Constitutional: Negative for chills, fatigue and unexpected weight change.  HENT: Negative for congestion, rhinorrhea, sneezing and sore throat.   Eyes: Negative for photophobia, pain and redness.  Respiratory: Negative for cough, chest tightness and shortness of breath.   Cardiovascular: Negative for chest pain and palpitations.  Gastrointestinal: Negative for abdominal pain, constipation, diarrhea, nausea and vomiting.  Endocrine: Negative.   Genitourinary: Negative for dysuria and frequency.  Musculoskeletal: Negative for arthralgias, back pain, joint swelling and neck pain.  Skin: Negative for rash.  Allergic/Immunologic: Negative.   Neurological: Negative for tremors and numbness.  Hematological: Negative for adenopathy. Does not bruise/bleed easily.  Psychiatric/Behavioral: Negative for behavioral problems and sleep disturbance. The patient is not nervous/anxious.     Vital Signs: BP (!) 145/82   Pulse 72   Resp 16   Ht 5\' 5"  (1.651 m)   Wt (!) 359 lb (162.8 kg)   SpO2 98%   BMI 59.74 kg/m    Physical Exam  Constitutional: She is oriented to person, place, and time. She appears well-developed and well-nourished. No distress.  HENT:  Head: Normocephalic and atraumatic.  Mouth/Throat: Oropharynx is clear and moist. No oropharyngeal exudate.  Eyes: Pupils are equal, round, and reactive to light. EOM are normal.  Neck: Normal range of motion. Neck supple. No JVD present. No tracheal deviation present. No thyromegaly present.  Cardiovascular: Normal rate, regular rhythm and normal heart sounds. Exam reveals no gallop and no friction rub.  No murmur heard. Pulmonary/Chest: Effort normal and breath sounds normal. No respiratory distress. She has no wheezes. She has no rales. She exhibits no tenderness.  Abdominal: Soft. There is no tenderness. There is no guarding.  Musculoskeletal: Normal range of motion.  Lymphadenopathy:    She has no  cervical adenopathy.  Neurological: She is alert and oriented to person, place, and time. No cranial nerve deficit.  Skin: Skin is warm and dry. She is not diaphoretic.  Psychiatric: She has a normal mood and affect. Her behavior is normal. Judgment and thought content normal.  Nursing note and vitals reviewed.   Assessment/Plan: 1. OSA on CPAP Continue using CPAP.    2. Morbid obesity ([place]) Obesity Counseling: Risk Assessment: An assessment of behavioral risk factors was made today and includes lack of exercise sedentary lifestyle, lack of portion control and poor dietary habits.  Risk Modification Advice: She was counseled on portion control  guidelines. Restricting daily caloric intake to. 1500. The detrimental long term effects of obesity on her health and ongoing poor compliance was also discussed with the patient.     General Counseling: Arron verbalizes understanding of the findings of todays visit and agrees with plan of treatment. I have discussed any further diagnostic evaluation that may be needed or ordered today. We also reviewed her medications today. she has been encouraged to call the office with any questions or concerns that should arise related to todays visit.   Time spent: 20 Minutes   This patient was seen by Orson Gear AGNP-C in Collaboration with Dr Lavera Guise as a part of collaborative care agreement    Dr Lavera Guise Internal medicine

## 2018-01-11 ENCOUNTER — Ambulatory Visit: Payer: Self-pay | Admitting: Nurse Practitioner

## 2018-01-14 ENCOUNTER — Ambulatory Visit: Payer: Self-pay | Admitting: Nurse Practitioner

## 2018-01-14 ENCOUNTER — Other Ambulatory Visit: Payer: Self-pay | Admitting: Nurse Practitioner

## 2018-01-14 DIAGNOSIS — M5440 Lumbago with sciatica, unspecified side: Secondary | ICD-10-CM

## 2018-01-14 MED ORDER — OXYCODONE-ACETAMINOPHEN 5-325 MG PO TABS: 1.0000 | ORAL_TABLET | ORAL | 0 refills | Status: DC | PRN

## 2018-01-14 NOTE — Telephone Encounter (Signed)
Pt notifed.

## 2018-01-14 NOTE — Telephone Encounter (Signed)
Renewed pain medication for 30 days until able to see pain management specialist.

## 2018-01-14 NOTE — Progress Notes (Signed)
Renewed pain medication for 30 days until able to see pain management specialist.

## 2018-01-14 NOTE — Telephone Encounter (Signed)
Pt states that she spoke to [facility] and was supposed to ger  rx for oxycodone ,

## 2018-01-22 ENCOUNTER — Other Ambulatory Visit: Payer: Self-pay

## 2018-01-22 DIAGNOSIS — M5440 Lumbago with sciatica, unspecified side: Secondary | ICD-10-CM

## 2018-01-22 MED ORDER — TIZANIDINE HCL 4 MG PO TABS: 4.0000 mg | ORAL_TABLET | Freq: Two times a day (BID) | ORAL | 2 refills | Status: DC | PRN

## 2018-01-23 ENCOUNTER — Ambulatory Visit (INDEPENDENT_AMBULATORY_CARE_PROVIDER_SITE_OTHER): Payer: Self-pay

## 2018-01-23 DIAGNOSIS — G4733 Obstructive sleep apnea (adult) (pediatric): Secondary | ICD-10-CM

## 2018-01-23 NOTE — Progress Notes (Signed)
95 percentile pressure 11   95th percentile leak 13.2   apnea index 0.5 /hr  apnea-hypopnea index  0.7 /hr   total days used  >4 hr 90 days  total days used <4 hr 0 days  Total compliance 100 percent  Patient doing great loves her cpap

## 2018-01-30 ENCOUNTER — Other Ambulatory Visit: Payer: Self-pay

## 2018-01-31 ENCOUNTER — Other Ambulatory Visit: Payer: Self-pay

## 2018-01-31 DIAGNOSIS — F411 Generalized anxiety disorder: Secondary | ICD-10-CM

## 2018-01-31 MED ORDER — ALPRAZOLAM 0.5 MG PO TABS: 0.5000 mg | ORAL_TABLET | Freq: Three times a day (TID) | ORAL | 0 refills | Status: DC | PRN

## 2018-02-11 ENCOUNTER — Other Ambulatory Visit: Payer: Self-pay | Admitting: Nurse Practitioner

## 2018-02-11 ENCOUNTER — Telehealth: Payer: Self-pay

## 2018-02-11 ENCOUNTER — Other Ambulatory Visit: Payer: Self-pay

## 2018-02-11 DIAGNOSIS — F331 Major depressive disorder, recurrent, moderate: Secondary | ICD-10-CM

## 2018-02-11 DIAGNOSIS — F410 Panic disorder [episodic paroxysmal anxiety] without agoraphobia: Secondary | ICD-10-CM

## 2018-02-11 MED ORDER — DIAZEPAM 5 MG PO TABS: ORAL_TABLET | ORAL | 0 refills | Status: DC

## 2018-02-11 MED ORDER — DESVENLAFAXINE SUCCINATE ER 100 MG PO TB24: 100.0000 mg | ORAL_TABLET | Freq: Every day | ORAL | 3 refills | Status: DC

## 2018-02-11 NOTE — Telephone Encounter (Signed)
Pt advised we send med  

## 2018-02-11 NOTE — Telephone Encounter (Signed)
Sent prescription for diazepam 5mg . She should take this 1 hour prior to MRI. May repeat the dose in 45 minutes as needed. #2 tablets sent to her pharmacy. She will need a driver to and from the procedure if she takes this prescription.

## 2018-02-11 NOTE — Progress Notes (Signed)
Sent prescription for diazepam 5mg . She should take this 1 hour prior to MRI. May repeat the dose in 45 minutes as needed. #2 tablets sent to her pharmacy. She will need a driver to and from the procedure if she takes this prescription.

## 2018-02-15 ENCOUNTER — Ambulatory Visit
Admission: RE | Admit: 2018-02-15 | Discharge: 2018-02-15 | Disposition: A | Discharge status: Home / Self Care | Payer: BLUE CROSS/BLUE SHIELD | Source: Ambulatory Visit | Attending: Nurse Practitioner | Admitting: Nurse Practitioner

## 2018-02-15 DIAGNOSIS — M47816 Spondylosis without myelopathy or radiculopathy, lumbar region: Secondary | ICD-10-CM | POA: Insufficient documentation

## 2018-02-15 DIAGNOSIS — M545 Low back pain: Secondary | ICD-10-CM | POA: Diagnosis not present

## 2018-02-15 DIAGNOSIS — M5136 Other intervertebral disc degeneration, lumbar region: Secondary | ICD-10-CM | POA: Diagnosis not present

## 2018-02-15 DIAGNOSIS — K8020 Calculus of gallbladder without cholecystitis without obstruction: Secondary | ICD-10-CM | POA: Diagnosis not present

## 2018-02-21 ENCOUNTER — Ambulatory Visit (INDEPENDENT_AMBULATORY_CARE_PROVIDER_SITE_OTHER): Payer: Self-pay | Admitting: Nurse Practitioner

## 2018-02-21 ENCOUNTER — Encounter: Payer: Self-pay | Admitting: Nurse Practitioner

## 2018-02-21 VITALS — BP 143/75 | HR 74 | Resp 16 | Ht 68.0 in | Wt 357.8 lb

## 2018-02-21 DIAGNOSIS — M5136 Other intervertebral disc degeneration, lumbar region: Secondary | ICD-10-CM

## 2018-02-21 DIAGNOSIS — N83202 Unspecified ovarian cyst, left side: Secondary | ICD-10-CM

## 2018-02-21 DIAGNOSIS — M5440 Lumbago with sciatica, unspecified side: Secondary | ICD-10-CM

## 2018-02-21 MED ORDER — OXYCODONE-ACETAMINOPHEN 5-325 MG PO TABS: 1.0000 | ORAL_TABLET | ORAL | 0 refills | Status: DC | PRN

## 2018-02-21 NOTE — Progress Notes (Signed)
[facility] [place], [place] [address]  Internal MEDICINE  Office Visit Note  Patient Name: Candace Clayton  [id number]  [id number]  Date of Service: 03/03/2018  Chief Complaint  Patient presents with  . Labs Only    6wk follow up review MRI    Continues to have right sided lower back pain, radiating into the right hip and right upper leg. She had x-ray  Of the lumbar spie since her last visit. Did show facet hypertrophy in lumbar spine as well and enterolithiasis ata L4/L5. She does need to have refills of her pain medication, which she does take when needed.  She has now had MRI to further evaluate her lumbar disc disease. this did show multi-level disc disease, most severe at L4/L5 level. Here, there is advanced facet degenerative disease with mild central canal stenosis. There are bulging discs at multiple levels.  Of note, the cystic lesion, noted on previous exams, in left pelvis, has grown to 5.5cm in diameter.  Gallstones were also noted.        Current Medication: Outpatient Encounter Medications as of 02/21/2018  Medication Sig  . ALPRAZolam (XANAX) 0.5 MG tablet Take 1 tablet (0.5 mg total) by mouth 3 (three) times daily as needed for anxiety.  Marland Kitchen desvenlafaxine (PRISTIQ) 100 MG 24 hr tablet Take 1 tablet (100 mg total) by mouth daily.  . diazepam (VALIUM) 5 MG tablet Take 1 tablet po one hour prior to procedure. May repeat dose in 45 minutes as needed for persistent anxiety  . enalapril (VASOTEC) 20 MG tablet Take 1 tablet (20 mg total) by mouth 2 (two) times daily.  . hydrochlorothiazide (HYDRODIURIL) 25 MG tablet Take 1 tablet (25 mg total) by mouth daily.  Marland Kitchen lidocaine (LIDODERM) 5 % Remove & Discard patch within 12 hours or as directed by MD  . oxyCODONE-acetaminophen (PERCOCET/ROXICET) 5-325 MG tablet Take 1 tablet by mouth every 4 (four) hours as needed for severe pain.  Marland Kitchen tiZANidine (ZANAFLEX) 4 MG tablet Take 1 tablet (4 mg total) by mouth  2 (two) times daily as needed for muscle spasms.  . [DISCONTINUED] cetirizine (ZYRTEC) 10 MG tablet TAKE 1 TABLET(S) BY MOUTH DAILY FOR ALLERGIES  . [DISCONTINUED] oxyCODONE-acetaminophen (PERCOCET/ROXICET) 5-325 MG tablet Take 1 tablet by mouth every 4 (four) hours as needed for severe pain.  . predniSONE (STERAPRED UNI-PAK 21 TAB) 10 MG (21) TBPK tablet 6 day taper - take by mouth as directed for 6 days (Patient not taking: Reported on 02/21/2018)  . [DISCONTINUED] gabapentin (NEURONTIN) 300 MG capsule Take 300 mg by mouth.   No facility-administered encounter medications on file as of 02/21/2018.     Surgical History: Past Surgical History:  Procedure Laterality Date  . CESAREAN SECTION  1992  . FINGER SURGERY  2011    Medical History: Past Medical History:  Diagnosis Date  . Anxiety   . Depression   . History of C-section 47  . Hypertension     Family History: Family History  Problem Relation Age of Onset  . Breast cancer Mother 40       x 3 times  . Colon cancer Mother   . Brain cancer Mother   . Cancer - Colon Mother   . Cancer - Other Mother   . Heart Problems Father   . Colon cancer Brother   . Heart Problems Brother     Social History   Socioeconomic History  . Marital status: Divorced    Spouse  name: Not on file  . Number of children: Not on file  . Years of education: Not on file  . Highest education level: Not on file  Occupational History  . Not on file  Social Needs  . Financial resource strain: Not on file  . Food insecurity:    Worry: Not on file    Inability: Not on file  . Transportation needs:    Medical: Not on file    Non-medical: Not on file  Tobacco Use  . Smoking status: Never Smoker  . Smokeless tobacco: Never Used  Substance and Sexual Activity  . Alcohol use: No    Frequency: Never  . Drug use: No  . Sexual activity: Never    Birth control/protection: Post-menopausal  Lifestyle  . Physical activity:    Days per week: Not  on file    Minutes per session: Not on file  . Stress: Not on file  Relationships  . Social connections:    Talks on phone: Not on file    Gets together: Not on file    Attends religious service: Not on file    Active member of club or organization: Not on file    Attends meetings of clubs or organizations: Not on file    Relationship status: Not on file  . Intimate partner violence:    Fear of current or ex partner: Not on file    Emotionally abused: Not on file    Physically abused: Not on file    Forced sexual activity: Not on file  Other Topics Concern  . Not on file  Social History Narrative  . Not on file      Review of Systems  Constitutional: Positive for activity change and fatigue. Negative for appetite change and unexpected weight change.  HENT: Negative for congestion, postnasal drip, rhinorrhea and sore throat.   Eyes: Negative.   Respiratory: Negative for chest tightness, shortness of breath and wheezing.   Cardiovascular: Negative for chest pain and palpitations.  Gastrointestinal: Negative for abdominal pain, constipation, diarrhea, nausea and vomiting.  Endocrine: Negative for cold intolerance, heat intolerance, polydipsia, polyphagia and polyuria.  Genitourinary: Positive for flank pain. Negative for dysuria, frequency and urgency.  Musculoskeletal: Positive for arthralgias, back pain and myalgias.  Skin: Negative for rash.  Allergic/Immunologic: Negative for environmental allergies.  Neurological: Positive for headaches. Negative for weakness and numbness.  Hematological: Negative for adenopathy.  Psychiatric/Behavioral: Positive for behavioral problems and sleep disturbance. The patient is nervous/anxious.     Today's Vitals   02/21/18 1147  BP: (!) 143/75  Pulse: 74  Resp: 16  SpO2: 97%  Weight: (!) 357 lb 12.8 oz (162.3 kg)  Height: 5\' 8"  (1.727 m)   Physical Exam  Constitutional: She is oriented to person, place, and time. She appears  well-developed and well-nourished.  HENT:  Head: Normocephalic and atraumatic.  Nose: Nose normal.  Eyes: Pupils are equal, round, and reactive to light. Conjunctivae and EOM are normal.  Neck: Normal range of motion. Neck supple. No JVD present. No tracheal deviation present. No thyromegaly present.  Cardiovascular: Normal rate, regular rhythm and normal heart sounds.  Pulmonary/Chest: Effort normal and breath sounds normal. She has no wheezes.  Abdominal: Soft. Bowel sounds are normal. There is no tenderness.  Musculoskeletal:       Back:  Lymphadenopathy:    She has no cervical adenopathy.  Neurological: She is alert and oriented to person, place, and time. No cranial nerve deficit.  Skin: Skin  is warm and dry. Capillary refill takes 2 to 3 seconds.  Psychiatric: Her speech is normal and behavior is normal. Judgment and thought content normal. Her mood appears anxious. Cognition and memory are normal. She exhibits a depressed mood.  Tearful.  Nursing note and vitals reviewed.  Assessment/Plan: 1. Low back pain with sciatica, sciatica laterality unspecified, unspecified back pain laterality, unspecified chronicity Reviewed results of MRI with patient. Showing multi-level disc bulging. There is advanced, bilateral facet degenerative disease at L4/L5 with mild central canal narrowing. She may continue to take NSAIDs as needed and as indicated. Prescription for oxycodone/APAP 5/325mg  tablets given. Instructions state she may take every 4-6 hours as needed for severe pain. Reviewed risk factors and side effects associated with taking narcotic pain relievers. Referred to neurosurgery/spine specialist for further evaluation and treatment.  - oxyCODONE-acetaminophen (PERCOCET/ROXICET) 5-325 MG tablet; Take 1 tablet by mouth every 4 (four) hours as needed for severe pain.  Dispense: 45 tablet; Refill: 0  2. Other intervertebral disc degeneration, lumbar region Referred to neurosurgery/spine  specialist for further evaluation and treatment.  - Ambulatory referral to Orthopedic Surgery  3. Left ovarian cyst Left ovarian cyst, noted in prior imaging studies, appears to have grown, and is now 5 5cm in diameter. Referred again to GYN for further evaluation and treatment.  - Ambulatory referral to Gynecology  General Counseling: Delfina verbalizes understanding of the findings of todays visit and agrees with plan of treatment. I have discussed any further diagnostic evaluation that may be needed or ordered today. We also reviewed her medications today. she has been encouraged to call the office with any questions or concerns that should arise related to todays visit.  Reviewed risks and possible side effects associated with taking opiates and benzodiazepines. Combination of these could cause dizziness and drowsiness. Advised patient not to drive or operate machinery when taking these medications, as patient's and other's life can be at risk and will have consequences. Patient verbalized understanding in this matter.   This patient was seen by Leretha Pol FNP Collaboration with Dr Lavera Guise as a part of collaborative care agreement   Orders Placed This Encounter  Procedures  . Ambulatory referral to Gynecology  . Ambulatory referral to Orthopedic Surgery    Meds ordered this encounter  Medications  . oxyCODONE-acetaminophen (PERCOCET/ROXICET) 5-325 MG tablet    Sig: Take 1 tablet by mouth every 4 (four) hours as needed for severe pain.    Dispense:  45 tablet    Refill:  0    Please note change in dosing.    Order Specific Question:   Supervising Provider    Answer:   Lavera Guise [[id number]]    Time spent: 35 Minutes      Dr Lavera Guise Internal medicine

## 2018-02-26 ENCOUNTER — Other Ambulatory Visit: Payer: Self-pay

## 2018-02-26 ENCOUNTER — Telehealth: Payer: Self-pay | Admitting: Obstetrics & Gynecology

## 2018-02-26 MED ORDER — CETIRIZINE HCL 10 MG PO TABS: ORAL_TABLET | ORAL | 4 refills | Status: DC

## 2018-02-26 NOTE — Telephone Encounter (Signed)
[facility] referring for left ovarian cyst 5.5cm in diameter. Called and left voicemail for patient to call back to be  Schedule

## 2018-02-27 NOTE — Telephone Encounter (Signed)
Called and left voicemail for patient to call back to be  Schedule

## 2018-03-03 DIAGNOSIS — N83202 Unspecified ovarian cyst, left side: Secondary | ICD-10-CM | POA: Insufficient documentation

## 2018-03-06 NOTE — Telephone Encounter (Signed)
Called and left voicemail for patient to call back to be  Schedule. Referral notice sent to PCP

## 2018-03-11 ENCOUNTER — Encounter: Payer: Self-pay | Admitting: Obstetrics and Gynecology

## 2018-03-11 ENCOUNTER — Ambulatory Visit (INDEPENDENT_AMBULATORY_CARE_PROVIDER_SITE_OTHER): Payer: BLUE CROSS/BLUE SHIELD | Admitting: Obstetrics and Gynecology

## 2018-03-11 VITALS — BP 140/86 | HR 87 | Ht 69.0 in | Wt 334.0 lb

## 2018-03-11 DIAGNOSIS — N83202 Unspecified ovarian cyst, left side: Secondary | ICD-10-CM

## 2018-03-11 NOTE — Progress Notes (Signed)
Obstetrics & Gynecology Office Visit  PCP: Ronnell Freshwater, NP   Chief Complaint:  Chief Complaint  Patient presents with  . Ovarian Cyst    Referred by [facility]    History of Present Illness: The patient is a 57 y.o. female presenting for consultation at the request of [facility] NP concerning a recently imaged left adnexal mass.  Initial presentation was prompted by evaluation of sciatic and degenerative disc disease.  Previous MRI of lumbar spine partially imaged a 5cm left cystic adnexal process. Appearance was notable simple cyst. The patient endorses associated symptoms of back pain, weight gain, pelvic pressure, constipation and nausea.  The patient denies associated symptoms of  weight loss, night sweats, vaginal bleeding and diarrhea.  There is a notable family history of ovarian cancer, uterine cancer, breast cancer, or colon cancer, her mother had ovarian cancer/brain cancer.  She was seen in 2018 and after imaging revealed a 5cm left adnexal process on imaging.  She underwent OVA-1 testing which was normal for tumor markers but did not follow up for repeat imaging to document interval resolutions, stability, or increase in size.    Review of Systems: 10 point review of systems negative unless otherwise noted in HPI  Past Medical History:  Past Medical History:  Diagnosis Date  . Anxiety   . Depression   . History of C-section 51  . Hypertension     Past Surgical History:  Past Surgical History:  Procedure Laterality Date  . CESAREAN SECTION  1992  . FINGER SURGERY  2011    Gynecologic History: No LMP recorded. Patient is postmenopausal.  Obstetric History: G2P2000  Family History:  Family History  Problem Relation Age of Onset  . Breast cancer Mother 40       x 3 times  . Colon cancer Mother   . Brain cancer Mother   . Cancer - Colon Mother   . Cancer - Other Mother   . Heart Problems Father   . Colon cancer Brother   . Heart  Problems Brother     Social History:  Social History   Socioeconomic History  . Marital status: Divorced    Spouse name: Not on file  . Number of children: Not on file  . Years of education: Not on file  . Highest education level: Not on file  Occupational History  . Not on file  Social Needs  . Financial resource strain: Not on file  . Food insecurity:    Worry: Not on file    Inability: Not on file  . Transportation needs:    Medical: Not on file    Non-medical: Not on file  Tobacco Use  . Smoking status: Never Smoker  . Smokeless tobacco: Never Used  Substance and Sexual Activity  . Alcohol use: No    Frequency: Never  . Drug use: No  . Sexual activity: Not Currently    Birth control/protection: Post-menopausal  Lifestyle  . Physical activity:    Days per week: Not on file    Minutes per session: Not on file  . Stress: Not on file  Relationships  . Social connections:    Talks on phone: Not on file    Gets together: Not on file    Attends religious service: Not on file    Active member of club or organization: Not on file    Attends meetings of clubs or organizations: Not on file    Relationship status:  Not on file  . Intimate partner violence:    Fear of current or ex partner: Not on file    Emotionally abused: Not on file    Physically abused: Not on file    Forced sexual activity: Not on file  Other Topics Concern  . Not on file  Social History Narrative  . Not on file    Allergies:  Allergies  Allergen Reactions  . Gabapentin     Medications: Prior to Admission medications   Medication Sig Start Date End Date Taking? Authorizing Provider  ALPRAZolam Duanne Moron) 0.5 MG tablet Take 1 tablet (0.5 mg total) by mouth 3 (three) times daily as needed for anxiety. 01/31/18  Yes Boscia, Heather E, NP  cetirizine (ZYRTEC) 10 MG tablet TAKE 1 TABLET(S) BY MOUTH DAILY FOR ALLERGIES 02/26/18  Yes Boscia, Heather E, NP  desvenlafaxine (PRISTIQ) 100 MG 24 hr tablet  Take 1 tablet (100 mg total) by mouth daily. 02/11/18  Yes Boscia, Heather E, NP  diazepam (VALIUM) 5 MG tablet Take 1 tablet po one hour prior to procedure. May repeat dose in 45 minutes as needed for persistent anxiety 02/11/18  Yes Boscia, Heather E, NP  enalapril (VASOTEC) 20 MG tablet Take 1 tablet (20 mg total) by mouth 2 (two) times daily. 10/04/17  Yes Boscia, Greer Ee, NP  hydrochlorothiazide (HYDRODIURIL) 25 MG tablet Take 1 tablet (25 mg total) by mouth daily. 10/04/17  Yes Ronnell Freshwater, NP  lidocaine (LIDODERM) 5 % Remove & Discard patch within 12 hours or as directed by MD 12/05/17  Yes Ronnell Freshwater, NP  oxyCODONE-acetaminophen (PERCOCET/ROXICET) 5-325 MG tablet Take 1 tablet by mouth every 4 (four) hours as needed for severe pain. 02/21/18  Yes Boscia, Greer Ee, NP  predniSONE (STERAPRED UNI-PAK 21 TAB) 10 MG (21) TBPK tablet 6 day taper - take by mouth as directed for 6 days 12/04/17  Yes Boscia, Heather E, NP  tiZANidine (ZANAFLEX) 4 MG tablet Take 1 tablet (4 mg total) by mouth 2 (two) times daily as needed for muscle spasms. 01/22/18  Yes Boscia, Heather E, NP  gabapentin (NEURONTIN) 300 MG capsule Take 300 mg by mouth. 04/28/14 10/04/17  [provider]    Physical Exam Vitals: Blood pressure 140/86, pulse 87, height 5\' 9"  (1.753 m), weight (!) 334 lb (151.5 kg). Body mass index is 49.32 kg/m. No LMP recorded. Patient is postmenopausal.  General: NAD HEENT: normocephalic, anicteric Pulmonary: No increased work of breathing Extremities: no edema, erythema, or tenderness Neurologic: Grossly intact Psychiatric: mood appropriate, affect full  Female chaperone present for pelvic and breast  portions of the physical exam  Mr Lumbar Spine Wo Contrast  Result Date: 02/15/2018 CLINICAL DATA:  Low back pain for 3 years.  No known injury. EXAM: MRI LUMBAR SPINE WITHOUT CONTRAST TECHNIQUE: Multiplanar, multisequence MR imaging of the lumbar spine was performed. No  intravenous contrast was administered. COMPARISON:  Plain films lumbar spine 10/26/2017. FINDINGS: Segmentation:  Standard. Alignment: Facet mediated 0.4 cm anterolisthesis L4 on L5 is identified. Trace retrolisthesis T12 on L1 is also noted. There is exaggeration of the normal lumbar lordosis. Vertebrae:  No fracture or worrisome lesion. Conus medullaris and cauda equina: Conus extends to the T12-L1 level. Conus and cauda equina appear normal. Paraspinal and other soft tissues: Stones measuring up to 1.9 cm are seen in the gallbladder. A partially visualized cystic lesion in the left pelvis measures 5.5 cm in diameter. Disc levels: T10-11 and T11-12 are imaged in the sagittal  plane only. There is loss of disc space height and a shallow bulge at T11-12. The disc appears slightly deform the ventral cord although the central canal appears open. Mild loss of disc space height T11-12 and a minimal bulge without stenosis also noted. T12-L1: Shallow broad-based central protrusion with caudal extension. The central canal and foramina are open. L1-2: Mild disc bulge without stenosis. L2-3: Mild disc bulge and facet arthropathy without stenosis. L3-4: Minimal disc bulge and mild facet degenerative disease without stenosis. L4-5: Advanced bilateral facet degenerative change is seen. The disc is uncovered with a shallow bulge. Mild central canal narrowing is present. The foramina are open. L5-S1: Bilateral facet degenerative disease and a shallow disc bulge are identified without central canal or foraminal stenosis. Far left paravertebral endplate spur incidentally noted. IMPRESSION: Lumbar spondylosis most notable at L4-5 where advanced facet degenerative disease results in 0.4 cm anterolisthesis. There is mild central canal narrowing at this level. No nerve root compression. Shallow broad-based disc bulge imaged in the sagittal plane only at T11-12 and slightly deforms the ventral cord but the central canal appears open at  this level 5.5 cm cystic lesion left pelvis is partially imaged and could be ovarian in origin. Recommend pelvic ultrasound for further evaluation. This recommendation follows ACR consensus guidelines: White Paper of the ACR Incidental Findings Committee II on Adnexal Findings. J Am Coll Radiol [phone]. Gallstones. Electronically Signed   By: Inge Rise M.D.   On: 02/15/2018 11:18    Assessment: 57 y.o. G2P2000 presenting for re-evalatuation of left adnexal cystic mass  Plan: Problem List Items Addressed This Visit      Genitourinary   Left ovarian cyst - Primary   Relevant Orders   US Transvaginal Non-OB   CA 125   US Transvaginal Non-OB      1) POSTMENOPAUSAL The incidence and implication of adnexal masses and ovarian cysts were discussed with the patient in detail.  Prior imaging if available was reviewed at today's visit.  While adnexal masses and cysts are a less common imaging finding in postmenopausal women as compared to premenopausal women, the vast majority of these lesions are still benign.  Follow up imaging to determine stability in size and appearance is reasonable in order to provide additional reassurance.  In some cases symptoms, family history, or indeterminate or concerning findings may warrant surgical evaluation and referral to a Gynecology-Oncologist., or serum tumor markers.   - Will obtain follow up imaging, the patient did not keep her previously scheduled ultrasound follow up to image this adnexal process.  If significant interval increase in size or stability and given patient symptoms she is interested in proceeding with laparoscopic left oophorectomy.    2) CA-125 ordered  3) IOTA LR2 score showing a LR of   4) A total of 15 minutes were spent in face-to-face contact with the patient during this encounter with over half of that time devoted to counseling and coordination of care.  5) Return in about 4 days (around 03/15/2018) for TVUS and follow up  left ovarian cyst.   Malachy Mood, MD, [place], [place] 03/11/2018, 10:59 AM

## 2018-03-12 DIAGNOSIS — M5416 Radiculopathy, lumbar region: Secondary | ICD-10-CM | POA: Diagnosis not present

## 2018-03-12 DIAGNOSIS — M5441 Lumbago with sciatica, right side: Secondary | ICD-10-CM | POA: Diagnosis not present

## 2018-03-12 DIAGNOSIS — G8929 Other chronic pain: Secondary | ICD-10-CM | POA: Diagnosis not present

## 2018-03-12 LAB — CA 125: Cancer Antigen (CA) 125: 8.5 U/mL (ref 0.0–38.1)

## 2018-03-21 ENCOUNTER — Encounter: Payer: Self-pay | Admitting: Obstetrics and Gynecology

## 2018-03-21 ENCOUNTER — Encounter: Payer: Self-pay | Admitting: Adult Health

## 2018-03-21 ENCOUNTER — Ambulatory Visit (INDEPENDENT_AMBULATORY_CARE_PROVIDER_SITE_OTHER): Payer: BLUE CROSS/BLUE SHIELD | Admitting: Obstetrics and Gynecology

## 2018-03-21 ENCOUNTER — Ambulatory Visit (INDEPENDENT_AMBULATORY_CARE_PROVIDER_SITE_OTHER): Payer: BLUE CROSS/BLUE SHIELD

## 2018-03-21 ENCOUNTER — Ambulatory Visit (INDEPENDENT_AMBULATORY_CARE_PROVIDER_SITE_OTHER): Payer: Self-pay | Admitting: Adult Health

## 2018-03-21 VITALS — BP 136/72 | HR 89 | Wt 335.0 lb

## 2018-03-21 DIAGNOSIS — M5440 Lumbago with sciatica, unspecified side: Secondary | ICD-10-CM

## 2018-03-21 DIAGNOSIS — N83202 Unspecified ovarian cyst, left side: Secondary | ICD-10-CM | POA: Diagnosis not present

## 2018-03-21 DIAGNOSIS — R102 Pelvic and perineal pain: Secondary | ICD-10-CM | POA: Diagnosis not present

## 2018-03-21 DIAGNOSIS — Z79899 Other long term (current) drug therapy: Secondary | ICD-10-CM

## 2018-03-21 DIAGNOSIS — M545 Low back pain, unspecified: Secondary | ICD-10-CM

## 2018-03-21 DIAGNOSIS — Z23 Encounter for immunization: Secondary | ICD-10-CM

## 2018-03-21 DIAGNOSIS — I10 Essential (primary) hypertension: Secondary | ICD-10-CM

## 2018-03-21 LAB — POCT URINE DRUG SCREEN
POC AMPHETAMINE UR: NOT DETECTED
POC BARBITURATE UR: NOT DETECTED
POC BENZODIAZEPINES UR: POSITIVE — AB
POC Cocaine UR: NOT DETECTED
POC MARIJUANA UR: NOT DETECTED
POC METHADONE UR: NOT DETECTED
POC METHAMPHETAMINE UR: NOT DETECTED
POC OPIATE UR: NOT DETECTED
POC OXYCODONE UR: NOT DETECTED
POC PHENCYCLIDINE UR: NOT DETECTED
POC TRICYCLICS UR: NOT DETECTED

## 2018-03-21 MED ORDER — OXYCODONE-ACETAMINOPHEN 5-325 MG PO TABS: 1.0000 | ORAL_TABLET | ORAL | 0 refills | Status: DC | PRN

## 2018-03-21 NOTE — Patient Instructions (Signed)

## 2018-03-21 NOTE — Progress Notes (Signed)
[facility] [place], [place] [address]  Internal MEDICINE  Office Visit Note  Patient Name: Candace Clayton  [id number]  [id number]  Date of Service: 03/24/2018  Chief Complaint  Patient presents with  . Hypertension  . Back Pain    needs rx refill    HPI Pt here for follow up on HTN, and Back pain.  Her blood pressure appears controlled on Vasotec at this time.  She reports she saw Luci Bank at [facility] about her back, and they have a plan.  However, the patient will most likely be having surgery to remove an ovary, or complete hysterectomy due to a growth on her ovary.  When she recovers from that, she will see Mrs. Gwenlyn Found again for her back.  Today she is requesting a refill on her Oxycodone.  She has been without any for 2 weeks.  On her UDS she tested negative for everything except benzo's, which is consistent with her prescribed medications.     Current Medication: Outpatient Encounter Medications as of 03/21/2018  Medication Sig  . ALPRAZolam (XANAX) 0.5 MG tablet Take 1 tablet (0.5 mg total) by mouth 3 (three) times daily as needed for anxiety.  . cetirizine (ZYRTEC) 10 MG tablet TAKE 1 TABLET(S) BY MOUTH DAILY FOR ALLERGIES  . diazepam (VALIUM) 5 MG tablet Take 1 tablet po one hour prior to procedure. May repeat dose in 45 minutes as needed for persistent anxiety  . enalapril (VASOTEC) 20 MG tablet Take 1 tablet (20 mg total) by mouth 2 (two) times daily.  . hydrochlorothiazide (HYDRODIURIL) 25 MG tablet Take 1 tablet (25 mg total) by mouth daily.  Marland Kitchen lidocaine (LIDODERM) 5 % Remove & Discard patch within 12 hours or as directed by MD  . oxyCODONE-acetaminophen (PERCOCET/ROXICET) 5-325 MG tablet Take 1 tablet by mouth every 4 (four) hours as needed for severe pain.  Marland Kitchen tiZANidine (ZANAFLEX) 4 MG tablet Take 1 tablet (4 mg total) by mouth 2 (two) times daily as needed for muscle spasms.  . [DISCONTINUED] oxyCODONE-acetaminophen (PERCOCET/ROXICET) 5-325 MG  tablet Take 1 tablet by mouth every 4 (four) hours as needed for severe pain.  Marland Kitchen desvenlafaxine (PRISTIQ) 100 MG 24 hr tablet Take 1 tablet (100 mg total) by mouth daily.  . predniSONE (STERAPRED UNI-PAK 21 TAB) 10 MG (21) TBPK tablet 6 day taper - take by mouth as directed for 6 days (Patient not taking: Reported on 03/21/2018)  . [DISCONTINUED] gabapentin (NEURONTIN) 300 MG capsule Take 300 mg by mouth.   No facility-administered encounter medications on file as of 03/21/2018.     Surgical History: Past Surgical History:  Procedure Laterality Date  . CESAREAN SECTION  1992  . FINGER SURGERY  2011    Medical History: Past Medical History:  Diagnosis Date  . Anxiety   . Depression   . History of C-section 68  . Hypertension     Family History: Family History  Problem Relation Age of Onset  . Breast cancer Mother 40       x 3 times  . Colon cancer Mother   . Brain cancer Mother   . Cancer - Colon Mother   . Cancer - Other Mother   . Heart Problems Father   . Colon cancer Brother   . Heart Problems Brother     Social History   Socioeconomic History  . Marital status: Divorced    Spouse name: Not on file  . Number of children: Not on file  .  Years of education: Not on file  . Highest education level: Not on file  Occupational History  . Not on file  Social Needs  . Financial resource strain: Not on file  . Food insecurity:    Worry: Not on file    Inability: Not on file  . Transportation needs:    Medical: Not on file    Non-medical: Not on file  Tobacco Use  . Smoking status: Never Smoker  . Smokeless tobacco: Never Used  Substance and Sexual Activity  . Alcohol use: No    Frequency: Never  . Drug use: No  . Sexual activity: Not Currently    Birth control/protection: Post-menopausal  Lifestyle  . Physical activity:    Days per week: Not on file    Minutes per session: Not on file  . Stress: Not on file  Relationships  . Social connections:     Talks on phone: Not on file    Gets together: Not on file    Attends religious service: Not on file    Active member of club or organization: Not on file    Attends meetings of clubs or organizations: Not on file    Relationship status: Not on file  . Intimate partner violence:    Fear of current or ex partner: Not on file    Emotionally abused: Not on file    Physically abused: Not on file    Forced sexual activity: Not on file  Other Topics Concern  . Not on file  Social History Narrative  . Not on file      Review of Systems  Constitutional: Negative for chills, fatigue and unexpected weight change.  HENT: Negative for congestion, rhinorrhea, sneezing and sore throat.   Eyes: Negative for photophobia, pain and redness.  Respiratory: Negative for cough, chest tightness and shortness of breath.   Cardiovascular: Negative for chest pain and palpitations.  Gastrointestinal: Negative for abdominal pain, constipation, diarrhea, nausea and vomiting.  Endocrine: Negative.   Genitourinary: Negative for dysuria and frequency.  Musculoskeletal: Positive for back pain. Negative for arthralgias, joint swelling and neck pain.  Skin: Negative for rash.  Allergic/Immunologic: Negative.   Neurological: Negative for tremors and numbness.  Hematological: Negative for adenopathy. Does not bruise/bleed easily.  Psychiatric/Behavioral: Negative for behavioral problems and sleep disturbance. The patient is not nervous/anxious.     Vital Signs: BP 138/76   Pulse 82   Resp 16   Ht 5\' 9"  (1.753 m)   Wt (!) 343 lb (155.6 kg)   SpO2 98%   BMI 50.65 kg/m    Physical Exam  Constitutional: She is oriented to person, place, and time. She appears well-developed and well-nourished. No distress.  HENT:  Head: Normocephalic and atraumatic.  Mouth/Throat: Oropharynx is clear and moist. No oropharyngeal exudate.  Eyes: Pupils are equal, round, and reactive to light. EOM are normal.  Neck: Normal  range of motion. Neck supple. No JVD present. No tracheal deviation present. No thyromegaly present.  Cardiovascular: Normal rate, regular rhythm and normal heart sounds. Exam reveals no gallop and no friction rub.  No murmur heard. Pulmonary/Chest: Effort normal and breath sounds normal. No respiratory distress. She has no wheezes. She has no rales. She exhibits no tenderness.  Abdominal: Soft. There is no tenderness. There is no guarding.  Musculoskeletal: Normal range of motion.  Lymphadenopathy:    She has no cervical adenopathy.  Neurological: She is alert and oriented to person, place, and time. No cranial nerve  deficit.  Skin: Skin is warm and dry. She is not diaphoretic.  Psychiatric: She has a normal mood and affect. Her behavior is normal. Judgment and thought content normal.  Nursing note and vitals reviewed.   Assessment/Plan: 1. Low back pain with sciatica, sciatica laterality unspecified, unspecified back pain laterality, unspecified chronicity Pt will continue to see Dr. Gwenlyn Found, when she gets over her abdominal surgery.  - oxyCODONE-acetaminophen (PERCOCET/ROXICET) 5-325 MG tablet; Take 1 tablet by mouth every 4 (four) hours as needed for severe pain.  Dispense: 45 tablet; Refill: 0  2. Essential hypertension Controlled, continue current therapy.   3. Encounter for long-term (current) use of high-risk medication - POCT Urine Drug Screen Positive for Benzos, negative for oxy.  Consistent with patient being out for 2 weeks.  4. Morbid obesity ([place]) Obesity Counseling: Risk Assessment: An assessment of behavioral risk factors was made today and includes lack of exercise sedentary lifestyle, lack of portion control and poor dietary habits.  Risk Modification Advice: She was counseled on portion control guidelines. Restricting daily caloric intake to. . The detrimental long term effects of obesity on her health and ongoing poor compliance was also discussed with the  patient.  5. Flu vaccine need - Flu Vaccine MDCK QUAD PF  General Counseling: Hala verbalizes understanding of the findings of todays visit and agrees with plan of treatment. I have discussed any further diagnostic evaluation that may be needed or ordered today. We also reviewed her medications today. she has been encouraged to call the office with any questions or concerns that should arise related to todays visit.    Orders Placed This Encounter  Procedures  . Flu Vaccine MDCK QUAD PF  . POCT Urine Drug Screen    Meds ordered this encounter  Medications  . oxyCODONE-acetaminophen (PERCOCET/ROXICET) 5-325 MG tablet    Sig: Take 1 tablet by mouth every 4 (four) hours as needed for severe pain.    Dispense:  45 tablet    Refill:  0    Please note change in dosing.    Time spent: 20 Minutes   This patient was seen by Orson Gear AGNP-C in Collaboration with Dr Lavera Guise as a part of collaborative care agreement    Dr Lavera Guise Internal medicine

## 2018-03-21 NOTE — Progress Notes (Signed)
Gynecology Ultrasound Follow Up  Chief Complaint:  Chief Complaint  Patient presents with  . Follow-up    GYN U/S     History of Present Illness: Patient is a 57 y.o. female who presents today for ultrasound evaluation of left adnexal cyst, pelvic and back pain.  Ultrasound demonstrates the following findgins Adnexa: simple and stable left ovarian cyst Uterus: non-enlarged with normally imaged endometrial stripe Additional: No free fluid  CA-125 reviewed with patient and in normal range.  Cyst has been stable of the past year.  We discussed given appearance, stability, and negative CA-125 the concern for malignancy was extremely low, however some of her left sided abdominal/back symptoms may be related to the cyst.  Review of Systems: Review of Systems  Constitutional: Negative.   Gastrointestinal: Positive for abdominal pain.  Genitourinary: Negative.     Past Medical History:  Past Medical History:  Diagnosis Date  . Anxiety   . Depression   . History of C-section 49  . Hypertension     Past Surgical History:  Past Surgical History:  Procedure Laterality Date  . CESAREAN SECTION  1992  . FINGER SURGERY  2011    Gynecologic History:  No LMP recorded. Patient is postmenopausal.   Family History:  Family History  Problem Relation Age of Onset  . Breast cancer Mother 40       x 3 times  . Colon cancer Mother   . Brain cancer Mother   . Cancer - Colon Mother   . Cancer - Other Mother   . Heart Problems Father   . Colon cancer Brother   . Heart Problems Brother     Social History:  Social History   Socioeconomic History  . Marital status: Divorced    Spouse name: Not on file  . Number of children: Not on file  . Years of education: Not on file  . Highest education level: Not on file  Occupational History  . Not on file  Social Needs  . Financial resource strain: Not on file  . Food insecurity:    Worry: Not on file    Inability: Not on file   . Transportation needs:    Medical: Not on file    Non-medical: Not on file  Tobacco Use  . Smoking status: Never Smoker  . Smokeless tobacco: Never Used  Substance and Sexual Activity  . Alcohol use: No    Frequency: Never  . Drug use: No  . Sexual activity: Not Currently    Birth control/protection: Post-menopausal  Lifestyle  . Physical activity:    Days per week: Not on file    Minutes per session: Not on file  . Stress: Not on file  Relationships  . Social connections:    Talks on phone: Not on file    Gets together: Not on file    Attends religious service: Not on file    Active member of club or organization: Not on file    Attends meetings of clubs or organizations: Not on file    Relationship status: Not on file  . Intimate partner violence:    Fear of current or ex partner: Not on file    Emotionally abused: Not on file    Physically abused: Not on file    Forced sexual activity: Not on file  Other Topics Concern  . Not on file  Social History Narrative  . Not on file    Allergies:  Allergies  Allergen  Reactions  . Gabapentin     Medications: Prior to Admission medications   Medication Sig Start Date End Date Taking? Authorizing Provider  ALPRAZolam Duanne Moron) 0.5 MG tablet Take 1 tablet (0.5 mg total) by mouth 3 (three) times daily as needed for anxiety. 01/31/18   Ronnell Freshwater, NP  cetirizine (ZYRTEC) 10 MG tablet TAKE 1 TABLET(S) BY MOUTH DAILY FOR ALLERGIES 02/26/18   Ronnell Freshwater, NP  desvenlafaxine (PRISTIQ) 100 MG 24 hr tablet Take 1 tablet (100 mg total) by mouth daily. 02/11/18   Ronnell Freshwater, NP  diazepam (VALIUM) 5 MG tablet Take 1 tablet po one hour prior to procedure. May repeat dose in 45 minutes as needed for persistent anxiety 02/11/18   Ronnell Freshwater, NP  enalapril (VASOTEC) 20 MG tablet Take 1 tablet (20 mg total) by mouth 2 (two) times daily. 10/04/17   Ronnell Freshwater, NP  hydrochlorothiazide (HYDRODIURIL) 25 MG tablet Take  1 tablet (25 mg total) by mouth daily. 10/04/17   Ronnell Freshwater, NP  lidocaine (LIDODERM) 5 % Remove & Discard patch within 12 hours or as directed by MD 12/05/17   Ronnell Freshwater, NP  oxyCODONE-acetaminophen (PERCOCET/ROXICET) 5-325 MG tablet Take 1 tablet by mouth every 4 (four) hours as needed for severe pain. 03/21/18   Kendell Bane, NP  predniSONE (STERAPRED UNI-PAK 21 TAB) 10 MG (21) TBPK tablet 6 day taper - take by mouth as directed for 6 days Patient not taking: Reported on 03/21/2018 12/04/17   Ronnell Freshwater, NP  tiZANidine (ZANAFLEX) 4 MG tablet Take 1 tablet (4 mg total) by mouth 2 (two) times daily as needed for muscle spasms. 01/22/18   Ronnell Freshwater, NP  gabapentin (NEURONTIN) 300 MG capsule Take 300 mg by mouth. 04/28/14 10/04/17  [provider]    Physical Exam Vitals: Blood pressure 136/72, pulse 89, weight (!) 335 lb (152 kg).  General: NAD HEENT: normocephalic, anicteric Pulmonary: No increased work of breathing Extremities: no edema, erythema, or tenderness Neurologic: Grossly intact, normal gait Psychiatric: mood appropriate, affect full   Assessment: 57 y.o. G2P2000 No problem-specific Assessment & Plan notes found for this encounter.   Plan: Problem List Items Addressed This Visit      Endocrine   Left ovarian cyst - Primary    Other Visit Diagnoses    Pelvic pain       Left-sided low back pain without sciatica, unspecified chronicity          1) Post for laparascopic LSO, right salpingectomy.  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  Given persistence over the past year unlikely to resolve spontaneously, and given symptoms patient opts to proceed with removal  2) Return if symptoms worsen or fail to improve.    Malachy Mood, MD, [facility], [facility] 03/21/2018, 4:51 PM

## 2018-03-22 ENCOUNTER — Telehealth: Payer: Self-pay | Admitting: Obstetrics and Gynecology

## 2018-03-22 NOTE — Telephone Encounter (Signed)
Patient is calling to confirm surgery. Please advise

## 2018-03-22 NOTE — Telephone Encounter (Signed)
Patient is calling to confirm

## 2018-03-25 ENCOUNTER — Telehealth: Payer: Self-pay | Admitting: Obstetrics and Gynecology

## 2018-03-25 NOTE — Telephone Encounter (Signed)
Patient is aware of H&P day of surgery, Pre-admit Testing to be scheduled, and OR on 04/02/18. Patient is aware she may receive calls from the [facility] and [facility]. Patient confirmed BCBS and no secondary insurance.

## 2018-03-25 NOTE — Telephone Encounter (Signed)
Patient is calling to speak with Izora Gala. There is a telephone message sent to Dr. Georgianne Fick about an surgery she is wanting to schedule. Please advise

## 2018-03-25 NOTE — Telephone Encounter (Signed)
-----   Message from Malachy Mood, MD sent at 03/25/2018  7:16 AM EDT ----- Regarding: Surgery Surgery Date:   LOS: same day surgery  Surgery Booking Request Patient Full Name: Candace Clayton MRN: [id number]  DOB: 1961/03/13  Surgeon: Malachy Mood, MD  Requested Surgery Date and Time: 1-2 weeks Primary Diagnosis and Code: Left ovarian cyst Secondary Diagnosis and Code:  Surgical Procedure: laparoscopic left salpingo-oophorectomy, right salpingectomy L&D Notification:N/A Admission Status: same day surgery Length of Surgery: 1hr Special Case Needs: none H&P: can be day of or week of (date) Phone Interview or Office Pre-Admit: pre-admit Interpreter: No Language: English Medical Clearance: No Special Scheduling Instructions: none

## 2018-03-26 ENCOUNTER — Telehealth: Payer: Self-pay | Admitting: Obstetrics and Gynecology

## 2018-03-26 NOTE — Telephone Encounter (Signed)
Patient is aware of Pre-admit Testing this Friday, 03/29/18 @ 8:45am, located at [facility]. Directions given.

## 2018-03-29 ENCOUNTER — Other Ambulatory Visit: Payer: Self-pay

## 2018-03-29 ENCOUNTER — Encounter
Admission: RE | Admit: 2018-03-29 | Discharge: 2018-03-29 | Disposition: A | Discharge status: Home / Self Care | Payer: BLUE CROSS/BLUE SHIELD | Source: Ambulatory Visit | Attending: Obstetrics and Gynecology | Admitting: Obstetrics and Gynecology

## 2018-03-29 DIAGNOSIS — Z01818 Encounter for other preprocedural examination: Secondary | ICD-10-CM | POA: Diagnosis not present

## 2018-03-29 DIAGNOSIS — I10 Essential (primary) hypertension: Secondary | ICD-10-CM | POA: Diagnosis not present

## 2018-03-29 HISTORY — DX: Angina pectoris, unspecified: I20.9

## 2018-03-29 HISTORY — DX: Sleep apnea, unspecified: G47.30

## 2018-03-29 HISTORY — DX: Gastro-esophageal reflux disease without esophagitis: K21.9

## 2018-03-29 LAB — CBC
HCT: 34 % — ABNORMAL LOW (ref 35.0–47.0)
Hemoglobin: 11.5 g/dL — ABNORMAL LOW (ref 12.0–16.0)
MCH: 27.5 pg (ref 26.0–34.0)
MCHC: 34 g/dL (ref 32.0–36.0)
MCV: 80.8 fL (ref 80.0–100.0)
PLATELETS: 229 10*3/uL (ref 150–440)
RBC: 4.2 MIL/uL (ref 3.80–5.20)
RDW: 16.1 % — AB (ref 11.5–14.5)
WBC: 6.4 10*3/uL (ref 3.6–11.0)

## 2018-03-29 LAB — BASIC METABOLIC PANEL
Anion gap: 7 (ref 5–15)
BUN: 17 mg/dL (ref 6–20)
CALCIUM: 8.9 mg/dL (ref 8.9–10.3)
CO2: 26 mmol/L (ref 22–32)
CREATININE: 0.7 mg/dL (ref 0.44–1.00)
Chloride: 105 mmol/L (ref 98–111)
GFR calc non Af Amer: >60 mL/min (ref >60)
Glucose, Bld: 139 mg/dL — ABNORMAL HIGH (ref 70–99)
Potassium: 4.4 mmol/L (ref 3.5–5.1)
SODIUM: 138 mmol/L (ref 135–145)

## 2018-03-29 NOTE — Patient Instructions (Addendum)
Your procedure is scheduled on: Tuesday, April 02, 2018  Report to [place]    DO NOT STOP ON THE FIRST FLOOR TO REGISTER  To find out your arrival time please call [phone] between 1PM - 3PM on Monday, April 01, 2018  Remember: Instructions that are not followed completely may result in serious medical risk,  up to and including death, or upon the discretion of your surgeon and anesthesiologist your  surgery may need to be rescheduled.     _X__ 1. Do not eat food after midnight the night before your procedure.                 No gum chewing or hard candies.                     ABSOLUTELY NOTHING SOLID IN YOUR MOUTH AFTER MIDNIGHT MONDAY                  You may drink clear liquids up to 2 hours before you are scheduled to arrive for your surgery-                   DO not drink clear liquids within 2 hours of the start of your surgery.                  Clear Liquids include:  water, apple juice without pulp, clear carbohydrate                 drink such as Clearfast of Gatorade, Black Coffee or Tea (Do not add                 anything to coffee or tea).  __X__2.  On the morning of surgery brush your teeth with toothpaste and water,                   You may rinse your mouth with mouthwash if you wish.                      Do not swallow any toothpaste of mouthwash.     _X__ 3.  No Alcohol for 24 hours before or after surgery.   _X__ 4.  Do Not Smoke or use e-cigarettes For 24 Hours Prior to Your Surgery.                 Do not use any chewable tobacco products for at least 6 hours prior to                 surgery.  ____  5.  Bring all medications with you on the day of surgery if instructed.   ____  6.  Notify your doctor if there is any change in your medical condition      (cold, fever, infections).     Do not wear jewelry, make-up, hairpins, clips or nail polish. Do not wear lotions, powders, or perfumes. You may  wear deodorant. Do not shave 48 hours prior to surgery. Men may shave face and neck. Do not bring valuables to the hospital.    [facility] is not responsible for any belongings or valuables.  Contacts, dentures or bridgework may not be worn into surgery. Leave your suitcase in the car. After surgery it may be brought to your room. For patients admitted to the hospital, discharge time is determined by your treatment team.   Patients discharged  the day of surgery will not be allowed to drive home.   Please read over the following fact sheets that you were given:   PREPARING FOR SURGERY   _X___ Take these medicines the morning of surgery with A SIP OF WATER:    1. XANAX  2. ZYRTEC  3. PRISTIQ  4.   5.  6.  ____ Fleet Enema (as directed)   _X___ Use CHG Soap as directed  _X___ Stop ALL ASPIRIN PRODUCTS AS OF TODAY  __X__ Stop Anti-inflammatories AS OF TODAY              THIS INCLUDES IBUPROFEN / MOTRIN /ADVIL / ALEVE   __X__ Stop supplements until after surgery.                STOP THE MULTIVITS UNTIL AFTER SURGERY  __X__ Bring C-Pap to the hospital.   CONTINUE TAKING VASOTEC AS USUAL BUT DO NOT TAKE ON THE DAY OF SURGERY  CONTINUE TAKING HYDRODIURIL AS USUAL BUT DO NOT TAKE ON THE DAY OF SURGERY  YOU MAY CONTINUE TAKING ZANAFLEX AT NIGHT AS NEEDED.  WEAR LOOSE FITTING CLOTHING TO THE HOSPITAL.  HAVE STOOL SOFTENERS AT HOME PRIOR TO SURGERY. BEGIN TAKING A DAY OR TWO BEFORE SURGERY.  **ABSOLUTELY NO ANIMALS IN BED WITH YOU THE NIGHT BEFORE SURGERY**   :( :(

## 2018-03-30 NOTE — Pre-Procedure Instructions (Signed)
Pre-op labs sent to Dr. Georgianne Fick and Anesthesia for review.

## 2018-04-02 ENCOUNTER — Other Ambulatory Visit: Payer: Self-pay

## 2018-04-02 ENCOUNTER — Ambulatory Visit: Payer: BLUE CROSS/BLUE SHIELD | Admitting: Anesthesiology

## 2018-04-02 ENCOUNTER — Encounter
Admission: RE | Disposition: A | Discharge status: Home / Self Care | Payer: Self-pay | Source: Ambulatory Visit | Attending: Obstetrics and Gynecology

## 2018-04-02 ENCOUNTER — Encounter: Payer: Self-pay | Admitting: *Deleted

## 2018-04-02 ENCOUNTER — Ambulatory Visit
Admission: RE | Admit: 2018-04-02 | Discharge: 2018-04-02 | Disposition: A | Discharge status: Home / Self Care | Payer: BLUE CROSS/BLUE SHIELD | Source: Ambulatory Visit | Attending: Obstetrics and Gynecology | Admitting: Obstetrics and Gynecology

## 2018-04-02 DIAGNOSIS — Z6841 Body Mass Index (BMI) 40.0 and over, adult: Secondary | ICD-10-CM | POA: Insufficient documentation

## 2018-04-02 DIAGNOSIS — Z79899 Other long term (current) drug therapy: Secondary | ICD-10-CM | POA: Insufficient documentation

## 2018-04-02 DIAGNOSIS — F419 Anxiety disorder, unspecified: Secondary | ICD-10-CM | POA: Insufficient documentation

## 2018-04-02 DIAGNOSIS — Z9889 Other specified postprocedural states: Secondary | ICD-10-CM

## 2018-04-02 DIAGNOSIS — D271 Benign neoplasm of left ovary: Secondary | ICD-10-CM | POA: Insufficient documentation

## 2018-04-02 DIAGNOSIS — G4730 Sleep apnea, unspecified: Secondary | ICD-10-CM | POA: Insufficient documentation

## 2018-04-02 DIAGNOSIS — K219 Gastro-esophageal reflux disease without esophagitis: Secondary | ICD-10-CM | POA: Insufficient documentation

## 2018-04-02 DIAGNOSIS — M5440 Lumbago with sciatica, unspecified side: Secondary | ICD-10-CM

## 2018-04-02 DIAGNOSIS — R971 Elevated cancer antigen 125 [CA 125]: Secondary | ICD-10-CM | POA: Diagnosis not present

## 2018-04-02 DIAGNOSIS — N83202 Unspecified ovarian cyst, left side: Secondary | ICD-10-CM | POA: Diagnosis not present

## 2018-04-02 DIAGNOSIS — N839 Noninflammatory disorder of ovary, fallopian tube and broad ligament, unspecified: Secondary | ICD-10-CM | POA: Diagnosis not present

## 2018-04-02 DIAGNOSIS — I10 Essential (primary) hypertension: Secondary | ICD-10-CM | POA: Diagnosis not present

## 2018-04-02 DIAGNOSIS — F329 Major depressive disorder, single episode, unspecified: Secondary | ICD-10-CM | POA: Insufficient documentation

## 2018-04-02 HISTORY — PX: LAPAROSCOPIC SALPINGO OOPHERECTOMY: SHX5927

## 2018-04-02 LAB — TYPE AND SCREEN
ABO/RH(D): O POS
ANTIBODY SCREEN: NEGATIVE

## 2018-04-02 LAB — ABO/RH: ABO/RH(D): O POS

## 2018-04-02 SURGERY — SALPINGO-OOPHORECTOMY, LAPAROSCOPIC
Anesthesia: General | Laterality: Left

## 2018-04-02 MED ORDER — FAMOTIDINE 20 MG PO TABS
20.0000 mg | ORAL_TABLET | Freq: Once | ORAL | Status: AC
  Administered 2018-04-02: 20 mg via ORAL

## 2018-04-02 MED ORDER — MIDAZOLAM HCL 2 MG/2ML IJ SOLN
INTRAMUSCULAR | Status: AC
  Filled 2018-04-02: qty 2

## 2018-04-02 MED ORDER — SUCCINYLCHOLINE CHLORIDE 20 MG/ML IJ SOLN
INTRAMUSCULAR | Status: DC | PRN
  Administered 2018-04-02: 160 mg via INTRAVENOUS

## 2018-04-02 MED ORDER — FENTANYL CITRATE (PF) 100 MCG/2ML IJ SOLN
INTRAMUSCULAR | Status: AC
  Filled 2018-04-02: qty 2

## 2018-04-02 MED ORDER — PROPOFOL 10 MG/ML IV BOLUS
INTRAVENOUS | Status: AC
  Filled 2018-04-02: qty 60

## 2018-04-02 MED ORDER — BUPIVACAINE HCL (PF) 0.5 % IJ SOLN
INTRAMUSCULAR | Status: AC
  Filled 2018-04-02: qty 30

## 2018-04-02 MED ORDER — DEXAMETHASONE SODIUM PHOSPHATE 10 MG/ML IJ SOLN
INTRAMUSCULAR | Status: DC | PRN
  Administered 2018-04-02: 8 mg via INTRAVENOUS

## 2018-04-02 MED ORDER — BUPIVACAINE HCL 0.5 % IJ SOLN
INTRAMUSCULAR | Status: DC | PRN
  Administered 2018-04-02: 27 mL

## 2018-04-02 MED ORDER — OXYCODONE HCL 5 MG PO TABS
5.0000 mg | ORAL_TABLET | Freq: Once | ORAL | Status: AC | PRN
  Administered 2018-04-02: 5 mg via ORAL

## 2018-04-02 MED ORDER — MEPERIDINE HCL 50 MG/ML IJ SOLN: 6.2500 mg | INTRAMUSCULAR | Status: DC | PRN

## 2018-04-02 MED ORDER — OXYCODONE HCL 5 MG/5ML PO SOLN: 5.0000 mg | Freq: Once | ORAL | Status: AC | PRN

## 2018-04-02 MED ORDER — LACTATED RINGERS IV SOLN
INTRAVENOUS | Status: DC
  Administered 2018-04-02: 08:00:00 via INTRAVENOUS

## 2018-04-02 MED ORDER — OXYCODONE-ACETAMINOPHEN 5-325 MG PO TABS: 1.0000 | ORAL_TABLET | ORAL | 0 refills | Status: DC | PRN

## 2018-04-02 MED ORDER — SUGAMMADEX SODIUM 500 MG/5ML IV SOLN
INTRAVENOUS | Status: DC | PRN
  Administered 2018-04-02: 400 mg via INTRAVENOUS

## 2018-04-02 MED ORDER — ROCURONIUM BROMIDE 100 MG/10ML IV SOLN
INTRAVENOUS | Status: DC | PRN
  Administered 2018-04-02: 10 mg via INTRAVENOUS
  Administered 2018-04-02: 60 mg via INTRAVENOUS

## 2018-04-02 MED ORDER — ONDANSETRON HCL 4 MG/2ML IJ SOLN
INTRAMUSCULAR | Status: DC | PRN
  Administered 2018-04-02: 4 mg via INTRAVENOUS

## 2018-04-02 MED ORDER — FAMOTIDINE 20 MG PO TABS
ORAL_TABLET | ORAL | Status: AC
  Filled 2018-04-02: qty 1

## 2018-04-02 MED ORDER — ACETAMINOPHEN 10 MG/ML IV SOLN
INTRAVENOUS | Status: DC | PRN
  Administered 2018-04-02: 1000 mg via INTRAVENOUS

## 2018-04-02 MED ORDER — FENTANYL CITRATE (PF) 100 MCG/2ML IJ SOLN
25.0000 ug | INTRAMUSCULAR | Status: DC | PRN
  Administered 2018-04-02 (×4): 25 ug via INTRAVENOUS

## 2018-04-02 MED ORDER — LACTATED RINGERS IV SOLN
INTRAVENOUS | Status: DC | PRN
  Administered 2018-04-02: 09:00:00 via INTRAVENOUS

## 2018-04-02 MED ORDER — FENTANYL CITRATE (PF) 100 MCG/2ML IJ SOLN
INTRAMUSCULAR | Status: DC | PRN
  Administered 2018-04-02 (×3): 50 ug via INTRAVENOUS

## 2018-04-02 MED ORDER — FENTANYL CITRATE (PF) 100 MCG/2ML IJ SOLN
INTRAMUSCULAR | Status: AC
  Administered 2018-04-02: 25 ug via INTRAVENOUS
  Filled 2018-04-02: qty 2

## 2018-04-02 MED ORDER — PHENYLEPHRINE HCL 10 MG/ML IJ SOLN
INTRAMUSCULAR | Status: DC | PRN
  Administered 2018-04-02 (×5): 100 ug via INTRAVENOUS

## 2018-04-02 MED ORDER — PROPOFOL 10 MG/ML IV BOLUS
INTRAVENOUS | Status: DC | PRN
  Administered 2018-04-02: 50 mg via INTRAVENOUS
  Administered 2018-04-02: 150 mg via INTRAVENOUS

## 2018-04-02 MED ORDER — PROMETHAZINE HCL 25 MG/ML IJ SOLN: 6.2500 mg | INTRAMUSCULAR | Status: DC | PRN

## 2018-04-02 MED ORDER — ACETAMINOPHEN NICU IV SYRINGE 10 MG/ML
INTRAVENOUS | Status: AC
  Filled 2018-04-02: qty 1

## 2018-04-02 MED ORDER — LIDOCAINE HCL (CARDIAC) PF 100 MG/5ML IV SOSY
PREFILLED_SYRINGE | INTRAVENOUS | Status: DC | PRN
  Administered 2018-04-02: 100 mg via INTRAVENOUS

## 2018-04-02 MED ORDER — OXYCODONE HCL 5 MG PO TABS
ORAL_TABLET | ORAL | Status: AC
  Filled 2018-04-02: qty 1

## 2018-04-02 SURGICAL SUPPLY — 42 items
ANCHOR TIS RET SYS 235ML (MISCELLANEOUS) IMPLANT
APPLICATOR ARISTA FLEXITIP XL (MISCELLANEOUS) ×2 IMPLANT
BAG URINE DRAINAGE (UROLOGICAL SUPPLIES) ×3 IMPLANT
BLADE SURG SZ11 CARB STEEL (BLADE) ×3 IMPLANT
CANISTER SUCT 1200ML W/VALVE (MISCELLANEOUS) ×3 IMPLANT
CATH FOLEY 2WAY  5CC 16FR (CATHETERS) ×1
CATH URTH 16FR FL 2W BLN LF (CATHETERS) ×2 IMPLANT
CHLORAPREP W/TINT 26ML (MISCELLANEOUS) ×3 IMPLANT
COVER WAND RF STERILE (DRAPES) ×2 IMPLANT
DERMABOND ADVANCED (GAUZE/BANDAGES/DRESSINGS) ×1
DERMABOND ADVANCED .7 DNX12 (GAUZE/BANDAGES/DRESSINGS) ×2 IMPLANT
GLOVE BIO SURGEON STRL SZ7 (GLOVE) ×13 IMPLANT
GLOVE INDICATOR 7.5 STRL GRN (GLOVE) ×13 IMPLANT
GOWN STRL REUS W/ TWL LRG LVL3 (GOWN DISPOSABLE) ×6 IMPLANT
GOWN STRL REUS W/ TWL XL LVL3 (GOWN DISPOSABLE) IMPLANT
GOWN STRL REUS W/TWL LRG LVL3 (GOWN DISPOSABLE) ×4
GOWN STRL REUS W/TWL XL LVL3 (GOWN DISPOSABLE)
GRASPER SUT TROCAR 14GX15 (MISCELLANEOUS) ×2 IMPLANT
HEMOSTAT ARISTA ABSORB 3G PWDR (MISCELLANEOUS) ×2 IMPLANT
IRRIGATION STRYKERFLOW (MISCELLANEOUS) ×1 IMPLANT
IRRIGATOR STRYKERFLOW (MISCELLANEOUS) ×3
IV LACTATED RINGERS 1000ML (IV SOLUTION) ×3 IMPLANT
KIT PINK PAD W/HEAD ARE REST (MISCELLANEOUS) ×3
KIT PINK PAD W/HEAD ARM REST (MISCELLANEOUS) ×2 IMPLANT
KIT TURNOVER CYSTO (KITS) ×3 IMPLANT
LABEL OR SOLS (LABEL) ×3 IMPLANT
NS IRRIG 500ML POUR BTL (IV SOLUTION) ×3 IMPLANT
PACK GYN LAPAROSCOPIC (MISCELLANEOUS) ×3 IMPLANT
PAD OB MATERNITY 4.3X12.25 (PERSONAL CARE ITEMS) ×3 IMPLANT
PAD PREP 24X41 OB/GYN DISP (PERSONAL CARE ITEMS) ×3 IMPLANT
SCISSORS METZENBAUM CVD 33 (INSTRUMENTS) IMPLANT
SHEARS HARMONIC ACE PLUS 36CM (ENDOMECHANICALS) ×3 IMPLANT
SLEEVE ENDOPATH XCEL 5M (ENDOMECHANICALS) ×3 IMPLANT
SUT MNCRL AB 4-0 PS2 18 (SUTURE) ×3 IMPLANT
SUT VIC AB 0 CT1 36 (SUTURE) ×2 IMPLANT
SUT VIC AB 2-0 UR6 27 (SUTURE) ×3 IMPLANT
SYSTEM WECK SHIELD CLOSURE (TROCAR) ×2 IMPLANT
TROCAR 12M 150ML BLUNT (TROCAR) ×2 IMPLANT
TROCAR 5M 150ML BLDLS (TROCAR) ×6 IMPLANT
TROCAR ENDO BLADELESS 11MM (ENDOMECHANICALS) ×1 IMPLANT
TROCAR XCEL NON-BLD 5MMX100MML (ENDOMECHANICALS) ×1 IMPLANT
TUBING INSUFFLATION (TUBING) ×3 IMPLANT

## 2018-04-02 NOTE — Discharge Instructions (Signed)

## 2018-04-02 NOTE — Progress Notes (Signed)
   04/02/18 1100  Clinical Encounter Type  Visited With Family (Son Dorothea Ogle)  Visit Type Psychological support  Referral From Other (Comment) (Registration)  Recommendations Follow-up, as needed.   Registration identified the patient's son, Dorothea Ogle, for Chaplain to assist during the doctor's explanation. Chaplain joined the son during the consult and remained to talk with him about his mother's condition. Dorothea Ogle understood the explanation and Chaplain escorted him to the waiting room. Chaplain went to PACU and prayed for the patient, and then returned to speak with the son. He is calm and his nervousness has subsided.

## 2018-04-02 NOTE — H&P (Signed)
Obstetrics & Gynecology Surgery H&P    Chief Complaint: Scheduled Surgery   History of Present Illness: Patient is a 57 y.o. G2P2000 presenting for scheduled for laparoscopic left salpingo-oophorectomy, right salpingectomy, for the treatment or further evaluation of persistent left ovarian cyst.   Prior Treatments prior to proceeding with surgery include: ultrasound and CA-125  The cyst has displayed stability over the course of a year.  However, she has had extensive work up for back pain with only notable finding currently being the cyst.     Review of Systems:10 point review of systems  Past Medical History:  Past Medical History:  Diagnosis Date  . Anginal pain (HCC)    tightness related to anxiety  . Anxiety   . Depression   . GERD (gastroesophageal reflux disease)    throws up easily but not diagnosed with reflux  . History of C-section 32  . Hypertension   . Sleep apnea    uses cpap    Past Surgical History:  Past Surgical History:  Procedure Laterality Date  . CESAREAN SECTION  1992  . COLONOSCOPY     polyps removed first procedure. 2nd time all was clear  . FINGER SURGERY Left 2011   left finger cut off x 2.(only up to last digit)  . HEMATOMA EVACUATION Left 1999   upper part of foot was injured d/t 500lb weight landing on her foot.     Family History:  Family History  Problem Relation Age of Onset  . Breast cancer Mother 40       x 3 times  . Colon cancer Mother   . Brain cancer Mother   . Cancer - Colon Mother   . Cancer - Other Mother   . Heart Problems Father   . Colon cancer Brother   . Heart Problems Brother     Social History:  Social History   Socioeconomic History  . Marital status: Divorced    Spouse name: Not on file  . Number of children: 1  . Years of education: Not on file  . Highest education level: Not on file  Occupational History  . Occupation: works in Secretary/administrator  Social Needs  . Financial resource strain: Not  on file  . Food insecurity:    Worry: Not on file    Inability: Not on file  . Transportation needs:    Medical: Not on file    Non-medical: Not on file  Tobacco Use  . Smoking status: Never Smoker  . Smokeless tobacco: Never Used  Substance and Sexual Activity  . Alcohol use: No    Frequency: Never  . Drug use: No  . Sexual activity: Not Currently    Birth control/protection: Post-menopausal  Lifestyle  . Physical activity:    Days per week: Not on file    Minutes per session: Not on file  . Stress: Not on file  Relationships  . Social connections:    Talks on phone: Not on file    Gets together: Not on file    Attends religious service: Not on file    Active member of club or organization: Not on file    Attends meetings of clubs or organizations: Not on file    Relationship status: Not on file  . Intimate partner violence:    Fear of current or ex partner: Not on file    Emotionally abused: Not on file    Physically abused: Not on file    Forced sexual  activity: Not on file  Other Topics Concern  . Not on file  Social History Narrative   Son has schizophrenia and autism but is fully capable of helping mother after surgery    Allergies:  No Active Allergies  Medications: Prior to Admission medications   Medication Sig Start Date End Date Taking? Authorizing Provider  ALPRAZolam Duanne Moron) 0.5 MG tablet Take 1 tablet (0.5 mg total) by mouth 3 (three) times daily as needed for anxiety. 01/31/18  Yes Boscia, Heather E, NP  cetirizine (ZYRTEC) 10 MG tablet TAKE 1 TABLET(S) BY MOUTH DAILY FOR ALLERGIES Patient taking differently: Take 10 mg by mouth daily.  02/26/18  Yes Boscia, Greer Ee, NP  desvenlafaxine (PRISTIQ) 100 MG 24 hr tablet Take 1 tablet (100 mg total) by mouth daily. 02/11/18  Yes Boscia, Heather E, NP  enalapril (VASOTEC) 20 MG tablet Take 1 tablet (20 mg total) by mouth 2 (two) times daily. Patient taking differently: Take 40 mg by mouth daily.  10/04/17  Yes  Boscia, Greer Ee, NP  hydrochlorothiazide (HYDRODIURIL) 25 MG tablet Take 1 tablet (25 mg total) by mouth daily. 10/04/17  Yes Boscia, Heather E, NP  lidocaine (LIDODERM) 5 % Remove & Discard patch within 12 hours or as directed by MD Patient taking differently: Place 1 patch onto the skin daily.  12/05/17  Yes Boscia, Greer Ee, NP  Multiple Vitamins-Calcium (ONE-A-DAY WOMENS PO) Take 1 tablet by mouth daily.   Yes [provider]  naproxen sodium (ALEVE) 220 MG tablet Take 440 mg by mouth 2 (two) times daily as needed (for pain or headache).   Yes [provider]  OVER THE COUNTER MEDICATION 1 tablet daily as needed. Takes generic stool softener for constipation d/t oxycodone.   Yes [provider]  oxyCODONE-acetaminophen (PERCOCET/ROXICET) 5-325 MG tablet Take 1 tablet by mouth every 4 (four) hours as needed for severe pain. Patient taking differently: Take 1 tablet by mouth every 4 (four) hours as needed for severe pain. Patient takes only at work 03/21/18  Yes Scarboro, Audie Clear, NP  Pseudoeph-Doxylamine-DM-APAP (NYQUIL PO) Take 2 capsules by mouth at bedtime as needed (for sleep).   Yes [provider]  tiZANidine (ZANAFLEX) 4 MG tablet Take 1 tablet (4 mg total) by mouth 2 (two) times daily as needed for muscle spasms. Patient taking differently: Take 4 mg by mouth 2 (two) times daily as needed for muscle spasms. Patient takes only at night 01/22/18  Yes Boscia, Heather E, NP  diazepam (VALIUM) 5 MG tablet Take 1 tablet po one hour prior to procedure. May repeat dose in 45 minutes as needed for persistent anxiety Patient not taking: Reported on 03/26/2018 02/11/18   Ronnell Freshwater, NP  gabapentin (NEURONTIN) 300 MG capsule Take 300 mg by mouth. 04/28/14 10/04/17  [provider]    Physical Exam Vitals: Blood pressure (!) 200/95, pulse 69, temperature (!) 97.2 F (36.2 C), temperature source Tympanic, resp. rate 16, height 5\' 9"  (1.753 m), weight (!)  158.8 kg, SpO2 100 %. General: NAD HEENT: normocephalic, anicteric Pulmonary: No increased work of breathing, CTAB Cardiovascular: RRR, distal pulses 2+ Abdomen: obese, soft, non-tender, non-disteneded Genitourinary: deferred Extremities: no edema, erythema, or tenderness Neurologic: Grossly intact Psychiatric: mood appropriate, affect full  Imaging US Transvaginal Non-ob  Result Date: 03/22/2018 Patient Name: JACI DESANTO DOB: [birth year] MRN: [id number] ULTRASOUND REPORT Location: [facility] Date of Service: 03/21/2018 Indications:Adnexal Mass seen on MRI Findings: The uterus is anteverted and measures 8.5 x 5.4  x 4.3 cm. Echo texture is homogenous without evidence of focal masses. Within the uterus are multiple suspected fibroids measuring: The Endometrium is heterogeneous and measures 8.7 mm. Right Ovary measures 2.2 x 1.6 x 1.2 cm. It is normal in appearance. Left Ovary measures 5.7 x 5.8 x 5.2 cm. It is not normal in appearance. Simple cyst seen measuring 5.0 x 4.4 x 4.4cm Survey of the adnexa demonstrates no adnexal masses. There is no free fluid in the cul de sac. Impression: 1. Heterogeneous endometrium 2. Large simple cyst on left ovary measuring 5.0 x 4.4 x 4.4 cm Recommendations: 1.Clinical correlation with the patient's History and Physical Exam. Vita Barley, RDMS RVT Simple left ovarian cyst, overall stable in size an appearance to prior imaging. Malachy Mood, MD, [facility], [facility]    Assessment: 57 y.o. G2P2000 presenting for scheduled LSO, RS   Plan: 1) I have had a careful discussion with this patient about all the options available and the risk/benefits of each. I have fully informed this patient that a laparoscopy may subject her to a variety of discomforts and risks: She understands that most patients have surgery with little difficulty, but problems can happen ranging from minor to fatal. These include nausea, vomiting, pain, bleeding,  infection, poor healing, hernia, or formation of adhesions. Unexpected reactions may occur from any drug or anesthetic given. Unintended injury may occur to other pelvic or abdominal structures such as Fallopian tubes, ovaries, bladder, ureter (tube from kidney to bladder), or bowel. Nerves going from the pelvis to the legs may be injured. Any such injury may require immediate or later additional surgery to correct the problem. Excessive blood loss requiring transfusion is very unlikely but possible. Dangerous blood clots may form in the legs or lungs. Physical and sexual activity will be restricted in varying degrees for an indeterminate period of time but most often 2-4 weeks. She understands that the plan is to do this laparoscopically, however, there is a chance that this will need to be performed via a larger incision. Finally, she understands that it is impossible to list every possible undesirable effect and that the condition for which surgery is done is not always cured or significantly improved, and in rare cases may be even worsen. Ample time was given to answer all questions.   2) Routine postoperative instructions were reviewed with the patient and her family in detail today including the expected length of recovery and likely postoperative course.  The patient concurred with the proposed plan, giving informed written consent for the surgery today.  Patient instructed on the importance of being NPO after midnight prior to her procedure.  If warranted preoperative prophylactic antibiotics and SCDs ordered on call to the OR to meet SCIP guidelines and adhere to recommendation laid forth in [place] Number 104 May 2009  "Antibiotic Prophylaxis for Gynecologic Procedures".     Malachy Mood, MD, [facility], [facility] 04/02/2018, 8:32 AM

## 2018-04-02 NOTE — Anesthesia Post-op Follow-up Note (Signed)
Anesthesia QCDR form completed.        

## 2018-04-02 NOTE — OR Nursing (Signed)
Discussed discharge instructions with pt and son. Both voice understanding.

## 2018-04-02 NOTE — Anesthesia Postprocedure Evaluation (Signed)
Anesthesia Post Note  Patient: Candace Clayton  Procedure(s) Performed: LAPAROSCOPIC SALPINGO OOPHORECTOMY (Left )  Patient location during evaluation: PACU Anesthesia Type: General Level of consciousness: awake and alert and oriented Pain management: pain level controlled Vital Signs Assessment: post-procedure vital signs reviewed and stable Respiratory status: spontaneous breathing, nonlabored ventilation and respiratory function stable Cardiovascular status: blood pressure returned to baseline and stable Postop Assessment: no signs of nausea or vomiting Anesthetic complications: no     Last Vitals:  Vitals:   04/02/18 1215 04/02/18 1228  BP: 106/60 (!) 174/79  Pulse: 76 76  Resp: 16   Temp: (!) 36.3 C   SpO2: 96%     Last Pain:  Vitals:   04/02/18 1254  TempSrc:   PainSc: 4                  Eshawn Coor

## 2018-04-02 NOTE — Op Note (Addendum)
Preoperative Diagnosis: 1) 57 y.o.  persistant left ovarian cyst 2) Ipsilateral hip and back pain 3) Normal CA-125 4) Morbid obesity Body mass index is 51.7 kg/m.  Postoperative Diagnosis: 1) 57 y.o.  persistant left ovarian cyst 2) Ipsilateral hip and back pain 3) Normal CA-125 4) Morbid obesity Body mass index is 51.7 kg/m.  Operation Performed: Laparoscopic left salpingo-oophorectomy  Indication: 57 y.o. G2P2000  with persistent left adnexal mass  Surgeon: Malachy Mood, MD  Assistant: Adrian Prows, MD No other capable assistant available, in surgery requiring high level assistant.   Anesthesia: CGeneral  Preoperative Antibiotics: none  Estimated Blood Loss: 95 mL  IV Fluids: 734mL  Urine Output:: 651mL  Drains or Tubes: none  Implants: none  Specimens Removed: left ovary and fallopian tube  Complications: none  Intraoperative Findings: Normal tubes, visualization limited by habitus.  Grossly normal uterus, non-visualized right ovary.  Left ovary with large simple cyst containing clear fluid.  Smooth walled.  Small omental adhesion to umbilicus.  Patient Condition: stable  Procedure in Detail:  Patient was taken to the operating room where she was administered general anesthesia.  She was positioned in the dorsal lithotomy position utilizing Allen stirups, prepped and draped in the usual sterile fashion.  Prior to proceeding with procedure a time out was performed.  Attention was turned to the patient's pelvis.  An indwelling foley catheter was used to empty the patient's bladder.  An operative speculum was placed to allow visualization of the cervix.  The anterior lip of the cervix was grasped with a single tooth tenaculum, and a Hulka tenaculum was placed to allow manipulation of the uterus.  The operative speculum and single tooth tenaculum were then removed.  Attention was turned to the patient's abdomen.  A Traxi panis retractor was placed at the time of  draping.  A site 4cm infraumbilical was infiltrated with 1% Sensorcaine, before making a stab incision using an 11 blade scalpel.  A long 96mm Excel trocar was then used to gain direct entry into the peritoneal cavity utilizing the camera to visualize progress of the trocar during placement.  Once peritoneal entry had been achieved, insufflation was started and pneumoperitoneum established at a pressure of 42mmHg.   General inspection of the abdomen revealed the above noted findings.  Two additional long 71mm excel trocars were placed in the left lower quadrant followed by a 59mm long excel trocar in the right lower quadrant.  Visualization was limited by patient habitus an inability to tolerate steep trendelenburg.  The left ovary was able to be elevated out of the pelvis an flipped anterior to the uterus.  There were filmy adhesion of the ovary and fallopian tube the the colonic epiploica which were taken down bluntly.  The Tube was to be able to be dissected off the ovary using a 65mm harmonic.  Visualization on the uterine side of the specimen was clear and the fallopian tubes cornual section was transected using the 85mm harmonic followed by the utero-ovarian ligament.  This allowed slightly more mobility in the ovary.  The cyst was ruptured and the IP ligament was visualized.  The IP ligament and ovarian attachments to the mesosalpinx were ligated and transected using the harmonic scalpel.  The specimen was removed through the 47mm port site.  Given difficulty with visualization attempts were not made to remove the right fallopian tube.  Pedicles were inspected and hemostatic.  3g of arista was applied to all pedicles.  A Weck EFX shield was used  to close the 26mm trocar site using 0 Vicryl.  The device was placed, wings deployed, sutures grasped with the supplied grasper.  After tieing down the suture no fascial defects were appreciated.   Pneumoperitoneum was evacuated.  The trocars were removed.  Trocar site  were closed with 4-0 Monocryl in a subcuticular fashion.  All trocar sites were then dressed with surgical skin glue.  The Hulka tenaculum and foley catheter were removed.  Sponge needle and instrument counts were correct time two.  The patient tolerated the procedure well and was taken to the recovery room in stable condition.

## 2018-04-02 NOTE — Anesthesia Preprocedure Evaluation (Signed)
Anesthesia Evaluation  Patient identified by MRN, date of birth, ID band Patient awake    Reviewed: Allergy & Precautions, NPO status , Patient's Chart, lab work & pertinent test results  History of Anesthesia Complications Negative for: history of anesthetic complications  Airway Mallampati: III  TM Distance: >3 FB Neck ROM: Full    Dental no notable dental hx.    Pulmonary sleep apnea and Continuous Positive Airway Pressure Ventilation , neg COPD,    breath sounds clear to auscultation- rhonchi (-) wheezing      Cardiovascular hypertension, Pt. on medications (-) CAD, (-) Past MI, (-) Cardiac Stents and (-) CABG  Rhythm:Regular Rate:Normal - Systolic murmurs and - Diastolic murmurs    Neuro/Psych PSYCHIATRIC DISORDERS Anxiety Depression negative neurological ROS     GI/Hepatic Neg liver ROS, GERD  ,  Endo/Other  negative endocrine ROSneg diabetes  Renal/GU negative Renal ROS     Musculoskeletal  (+) Arthritis ,   Abdominal (+) + obese,   Peds  Hematology negative hematology ROS (+)   Anesthesia Other Findings Past Medical History: No date: Anginal pain (HCC)     Comment:  tightness related to anxiety No date: Anxiety No date: Depression No date: GERD (gastroesophageal reflux disease)     Comment:  throws up easily but not diagnosed with reflux 1992: History of C-section No date: Hypertension No date: Sleep apnea     Comment:  uses cpap   Reproductive/Obstetrics                             Anesthesia Physical Anesthesia Plan  ASA: II  Anesthesia Plan: General   Post-op Pain Management:    Induction: Intravenous  PONV Risk Score and Plan: 2 and Ondansetron, Dexamethasone and Midazolam  Airway Management Planned: Oral ETT  Additional Equipment:   Intra-op Plan:   Post-operative Plan: Extubation in OR  Informed Consent: I have reviewed the patients History and  Physical, chart, labs and discussed the procedure including the risks, benefits and alternatives for the proposed anesthesia with the patient or authorized representative who has indicated his/her understanding and acceptance.   Dental advisory given  Plan Discussed with: CRNA and Anesthesiologist  Anesthesia Plan Comments:         Anesthesia Quick Evaluation

## 2018-04-02 NOTE — Transfer of Care (Signed)
Immediate Anesthesia Transfer of Care Note  Patient: Candace Clayton  Procedure(s) Performed: LAPAROSCOPIC SALPINGO OOPHORECTOMY (Left )  Patient Location: PACU  Anesthesia Type:General  Level of Consciousness: drowsy and patient cooperative  Airway & Oxygen Therapy: Patient Spontanous Breathing and Patient connected to face mask oxygen  Post-op Assessment: Report given to RN, Post -op Vital signs reviewed and stable and Patient moving all extremities  Post vital signs: Reviewed and stable  Last Vitals:  Vitals Value Taken Time  BP 162/73 04/02/2018 11:19 AM  Temp 36.4 C 04/02/2018 11:19 AM  Pulse 93 04/02/2018 11:23 AM  Resp 19 04/02/2018 11:23 AM  SpO2 99 % 04/02/2018 11:23 AM  Vitals shown include unvalidated device data.  Last Pain:  Vitals:   04/02/18 1119  TempSrc:   PainSc: Asleep         Complications: No apparent anesthesia complications

## 2018-04-02 NOTE — Anesthesia Procedure Notes (Signed)
Procedure Name: Intubation Date/Time: 04/02/2018 9:14 AM Performed by: Lowry Bowl, CRNA Pre-anesthesia Checklist: Patient identified, Emergency Drugs available, Suction available, Patient being monitored and Timeout performed Patient Re-evaluated:Patient Re-evaluated prior to induction Oxygen Delivery Method: Circle system utilized Preoxygenation: Pre-oxygenation with 100% oxygen (Ramp w/ sniffing position) Induction Type: IV induction and Cricoid Pressure applied Ventilation: Mask ventilation without difficulty Laryngoscope Size: Mac and 3 Grade View: Grade II Tube type: Oral Tube size: 7.0 mm Number of attempts: 1 Airway Equipment and Method: Stylet Placement Confirmation: ETT inserted through vocal cords under direct vision,  positive ETCO2 and breath sounds checked- equal and bilateral Secured at: 22 cm Tube secured with: Tape Dental Injury: Teeth and Oropharynx as per pre-operative assessment

## 2018-04-03 ENCOUNTER — Telehealth: Payer: Self-pay

## 2018-04-03 ENCOUNTER — Encounter: Payer: Self-pay | Admitting: *Deleted

## 2018-04-03 ENCOUNTER — Other Ambulatory Visit: Payer: Self-pay

## 2018-04-03 ENCOUNTER — Emergency Department: Payer: BLUE CROSS/BLUE SHIELD

## 2018-04-03 ENCOUNTER — Emergency Department
Admission: EM | Admit: 2018-04-03 | Discharge: 2018-04-03 | Disposition: A | Discharge status: Home / Self Care | Payer: BLUE CROSS/BLUE SHIELD | Attending: Emergency Medicine | Admitting: Emergency Medicine

## 2018-04-03 DIAGNOSIS — G8918 Other acute postprocedural pain: Secondary | ICD-10-CM | POA: Diagnosis not present

## 2018-04-03 DIAGNOSIS — Z9889 Other specified postprocedural states: Secondary | ICD-10-CM | POA: Diagnosis not present

## 2018-04-03 DIAGNOSIS — K8020 Calculus of gallbladder without cholecystitis without obstruction: Secondary | ICD-10-CM | POA: Diagnosis not present

## 2018-04-03 DIAGNOSIS — I10 Essential (primary) hypertension: Secondary | ICD-10-CM | POA: Diagnosis not present

## 2018-04-03 DIAGNOSIS — Z79899 Other long term (current) drug therapy: Secondary | ICD-10-CM | POA: Diagnosis not present

## 2018-04-03 DIAGNOSIS — R1031 Right lower quadrant pain: Secondary | ICD-10-CM | POA: Insufficient documentation

## 2018-04-03 LAB — CBC
HEMATOCRIT: 39.5 % (ref 36.0–46.0)
HEMOGLOBIN: 12.6 g/dL (ref 12.0–15.0)
MCH: 26.7 pg (ref 26.0–34.0)
MCHC: 31.9 g/dL (ref 30.0–36.0)
MCV: 83.7 fL (ref 80.0–100.0)
Platelets: 353 10*3/uL (ref 150–400)
RBC: 4.72 MIL/uL (ref 3.87–5.11)
RDW: 15.3 % (ref 11.5–15.5)
WBC: 14.1 10*3/uL — ABNORMAL HIGH (ref 4.0–10.5)
nRBC: 0 % (ref 0.0–0.2)

## 2018-04-03 LAB — COMPREHENSIVE METABOLIC PANEL
ALT: 28 U/L (ref 0–44)
ANION GAP: 9 (ref 5–15)
AST: 24 U/L (ref 15–41)
Albumin: 4.3 g/dL (ref 3.5–5.0)
Alkaline Phosphatase: 79 U/L (ref 38–126)
BILIRUBIN TOTAL: 0.5 mg/dL (ref 0.3–1.2)
BUN: 20 mg/dL (ref 6–20)
CHLORIDE: 101 mmol/L (ref 98–111)
CO2: 26 mmol/L (ref 22–32)
Calcium: 9.7 mg/dL (ref 8.9–10.3)
Creatinine, Ser: 0.85 mg/dL (ref 0.44–1.00)
GFR calc Af Amer: >60 mL/min (ref >60)
GFR calc non Af Amer: >60 mL/min (ref >60)
GLUCOSE: 123 mg/dL — AB (ref 70–99)
POTASSIUM: 4.4 mmol/L (ref 3.5–5.1)
SODIUM: 136 mmol/L (ref 135–145)
TOTAL PROTEIN: 7.9 g/dL (ref 6.5–8.1)

## 2018-04-03 LAB — URINALYSIS, COMPLETE (UACMP) WITH MICROSCOPIC
BACTERIA UA: NONE SEEN
Bilirubin Urine: NEGATIVE
Glucose, UA: NEGATIVE mg/dL
KETONES UR: NEGATIVE mg/dL
Leukocytes, UA: NEGATIVE
NITRITE: NEGATIVE
PROTEIN: NEGATIVE mg/dL
Specific Gravity, Urine: 1.014 (ref 1.005–1.030)
pH: 5 (ref 5.0–8.0)

## 2018-04-03 MED ORDER — OXYCODONE HCL 5 MG PO TABS
10.0000 mg | ORAL_TABLET | Freq: Once | ORAL | Status: AC
  Administered 2018-04-03: 10 mg via ORAL
  Filled 2018-04-03: qty 2

## 2018-04-03 MED ORDER — MORPHINE SULFATE (PF) 4 MG/ML IV SOLN
4.0000 mg | Freq: Once | INTRAVENOUS | Status: AC
  Administered 2018-04-03: 4 mg via INTRAVENOUS
  Filled 2018-04-03: qty 1

## 2018-04-03 MED ORDER — ONDANSETRON HCL 4 MG/2ML IJ SOLN
4.0000 mg | Freq: Once | INTRAMUSCULAR | Status: AC
  Administered 2018-04-03: 4 mg via INTRAVENOUS
  Filled 2018-04-03: qty 2

## 2018-04-03 MED ORDER — ACETAMINOPHEN 500 MG PO TABS
1000.0000 mg | ORAL_TABLET | Freq: Once | ORAL | Status: AC
  Administered 2018-04-03: 1000 mg via ORAL
  Filled 2018-04-03: qty 2

## 2018-04-03 MED ORDER — IOPAMIDOL (ISOVUE-300) INJECTION 61%
125.0000 mL | Freq: Once | INTRAVENOUS | Status: AC | PRN
  Administered 2018-04-03: 125 mL via INTRAVENOUS

## 2018-04-03 NOTE — ED Triage Notes (Signed)
First Nurse Note:  C/O postoperative abdominal pain.  States had left oophorectomy yesterday, c/o RLQ pain.    Patient tearful.  AAOx3.  Skin warm and dry.  Refuses offer of wheelchair.

## 2018-04-03 NOTE — Telephone Encounter (Signed)
Pt is scheduled at 8:30 with AMS tomorrow per Kenton Kingfisher advise.

## 2018-04-03 NOTE — Telephone Encounter (Signed)
Candace Clayton w/[employer] calling to verify surgery name, date & date of post op visit. UP#[id number] ref #[id number]

## 2018-04-03 NOTE — Telephone Encounter (Signed)
Spoke w/[employer], requested info given.

## 2018-04-03 NOTE — Telephone Encounter (Signed)
Sch appt w Dr Georgianne Fick tomorrow am.  Can be seen sooner, or pain meds adjusted, as needed.

## 2018-04-03 NOTE — ED Notes (Signed)
Patient transported to CT 

## 2018-04-03 NOTE — ED Provider Notes (Signed)
[facility] Emergency Department Provider Note  ____________________________________________  Time seen: Approximately 4:20 PM  I have reviewed the triage vital signs and the nursing notes.   HISTORY  Chief Complaint Post-op Problem   HPI Candace Clayton is a 57 y.o. female postop day 1 from left oophorectomy by Dr. Georgianne Fick who presents for evaluation of abdominal pain.  Patient reports that she was up all night in severe pain.  The pain is sharp, constant, worse on the right lower quadrant.  She has been taking the Percocet as she was given by Dr. Georgianne Fick with no significant relief.  No fever or chills, no nausea or vomiting, no dysuria or hematuria.  Currently the pain is 10 out of 10.  Past Medical History:  Diagnosis Date  . Anginal pain (HCC)    tightness related to anxiety  . Anxiety   . Depression   . GERD (gastroesophageal reflux disease)    throws up easily but not diagnosed with reflux  . History of C-section 52  . Hypertension   . Sleep apnea    uses cpap    Patient Active Problem List   Diagnosis Date Noted  . Left ovarian cyst 03/03/2018  . Other intervertebral disc degeneration, lumbar region 12/23/2017  . Screening for breast cancer 10/24/2017  . Lumbago with sciatica, unspecified side 07/03/2017  . Allergic rhinitis, unspecified 07/03/2017  . Pain in unspecified knee 07/03/2017  . Obstructive sleep apnea, adult 07/03/2017  . Sedative, hypnotic, or anxiolytic use, unspecified, uncomplicated 85/07/7739  . Hypersomnia 06/27/2017  . Major depressive disorder, recurrent episode, moderate ([place]) 06/27/2017  . Essential hypertension 06/27/2017  . Morbid obesity ([place]) 06/27/2017  . GAD (generalized anxiety disorder) 06/27/2017  . Urinary tract infection 06/27/2017    Past Surgical History:  Procedure Laterality Date  . CESAREAN SECTION  1992  . COLONOSCOPY     polyps removed first procedure. 2nd time all was clear  . FINGER  SURGERY Left 2011   left finger cut off x 2.(only up to last digit)  . HEMATOMA EVACUATION Left 1999   upper part of foot was injured d/t 500lb weight landing on her foot.   Marland Kitchen LAPAROSCOPIC SALPINGO OOPHERECTOMY Left 04/02/2018   Procedure: LAPAROSCOPIC SALPINGO OOPHORECTOMY;  Surgeon: Malachy Mood, MD;  Location: [facility] ORS;  Service: Gynecology;  Laterality: Left;    Prior to Admission medications   Medication Sig Start Date End Date Taking? Authorizing Provider  cetirizine (ZYRTEC) 10 MG tablet TAKE 1 TABLET(S) BY MOUTH DAILY FOR ALLERGIES Patient taking differently: Take 10 mg by mouth daily.  02/26/18   Ronnell Freshwater, NP  desvenlafaxine (PRISTIQ) 100 MG 24 hr tablet Take 1 tablet (100 mg total) by mouth daily. 02/11/18   Ronnell Freshwater, NP  enalapril (VASOTEC) 20 MG tablet Take 1 tablet (20 mg total) by mouth 2 (two) times daily. Patient taking differently: Take 40 mg by mouth daily.  10/04/17   Ronnell Freshwater, NP  hydrochlorothiazide (HYDRODIURIL) 25 MG tablet Take 1 tablet (25 mg total) by mouth daily. 10/04/17   Ronnell Freshwater, NP  lidocaine (LIDODERM) 5 % Remove & Discard patch within 12 hours or as directed by MD Patient taking differently: Place 1 patch onto the skin daily.  12/05/17   Ronnell Freshwater, NP  Multiple Vitamins-Calcium (ONE-A-DAY WOMENS PO) Take 1 tablet by mouth daily.    [provider]  OVER THE COUNTER MEDICATION 1 tablet daily as needed. Takes generic stool softener for  constipation d/t oxycodone.    [provider]  oxyCODONE-acetaminophen (PERCOCET/ROXICET) 5-325 MG tablet Take 1 tablet by mouth every 4 (four) hours as needed for severe pain. 04/02/18   Malachy Mood, MD  Pseudoeph-Doxylamine-DM-APAP (NYQUIL PO) Take 2 capsules by mouth at bedtime as needed (for sleep).    [provider]  gabapentin (NEURONTIN) 300 MG capsule Take 300 mg by mouth. 04/28/14 10/04/17  [provider]    Allergies Patient has no  known allergies.  Family History  Problem Relation Age of Onset  . Breast cancer Mother 40       x 3 times  . Colon cancer Mother   . Brain cancer Mother   . Cancer - Colon Mother   . Cancer - Other Mother   . Heart Problems Father   . Colon cancer Brother   . Heart Problems Brother     Social History Social History   Tobacco Use  . Smoking status: Never Smoker  . Smokeless tobacco: Never Used  Substance Use Topics  . Alcohol use: No    Frequency: Never  . Drug use: No    Review of Systems  Constitutional: Negative for fever. Eyes: Negative for visual changes. ENT: Negative for sore throat. Neck: No neck pain  Cardiovascular: Negative for chest pain. Respiratory: Negative for shortness of breath. Gastrointestinal: + abdominal pain. No vomiting or diarrhea. Genitourinary: Negative for dysuria. Musculoskeletal: Negative for back pain. Skin: Negative for rash. Neurological: Negative for headaches, weakness or numbness. Psych: No SI or HI  ____________________________________________   PHYSICAL EXAM:  VITAL SIGNS: ED Triage Vitals  Enc Vitals Group     BP 04/03/18 1510 (!) 187/88     Pulse Rate 04/03/18 1510 79     Resp 04/03/18 1510 18     Temp 04/03/18 1510 98.1 F (36.7 C)     Temp Source 04/03/18 1510 Oral     SpO2 04/03/18 1510 95 %     Weight 04/03/18 1511 (!) 350 lb (158.8 kg)     Height 04/03/18 1511 5\' 9"  (1.753 m)     Head Circumference --      Peak Flow --      Pain Score 04/03/18 1511 10     Pain Loc --      Pain Edu? --      Excl. in [place]? --     Constitutional: Alert and oriented. Well appearing and in no apparent distress. HEENT:      Head: Normocephalic and atraumatic.         Eyes: Conjunctivae are normal. Sclera is non-icteric.       Mouth/Throat: Mucous membranes are moist.       Neck: Supple with no signs of meningismus. Cardiovascular: Regular rate and rhythm. No murmurs, gallops, or rubs. 2+ symmetrical distal pulses are present  in all extremities. No JVD. Respiratory: Normal respiratory effort. Lungs are clear to auscultation bilaterally. No wheezes, crackles, or rhonchi.  Gastrointestinal: Obese, tender to percussion on the right lower quadrant with localized guarding, well-healing laparoscopy incisions  Musculoskeletal: Nontender with normal range of motion in all extremities. No edema, cyanosis, or erythema of extremities. Neurologic: Normal speech and language. Face is symmetric. Moving all extremities. No gross focal neurologic deficits are appreciated. Skin: Skin is warm, dry and intact. No rash noted. Psychiatric: Mood and affect are normal. Speech and behavior are normal.  ____________________________________________   LABS (all labs ordered are listed, but only abnormal results are displayed)  Labs Reviewed  COMPREHENSIVE METABOLIC PANEL - Abnormal; Notable for the following components:      Result Value   Glucose, Bld 123 (*)    All other components within normal limits  CBC - Abnormal; Notable for the following components:   WBC 14.1 (*)    All other components within normal limits  URINALYSIS, COMPLETE (UACMP) WITH MICROSCOPIC - Abnormal; Notable for the following components:   Color, Urine YELLOW (*)    APPearance CLEAR (*)    Hgb urine dipstick SMALL (*)    All other components within normal limits   ____________________________________________  EKG  none  ____________________________________________  RADIOLOGY  I have personally reviewed the images performed during this visit and I agree with the Radiologist's read.   Interpretation by Radiologist:  Ct Abdomen Pelvis W Contrast  Result Date: 04/03/2018 CLINICAL DATA:  Lower abdominal pain on the right, history of recent left oophorectomy EXAM: CT ABDOMEN AND PELVIS WITH CONTRAST TECHNIQUE: Multidetector CT imaging of the abdomen and pelvis was performed using the standard protocol following bolus administration of intravenous  contrast. CONTRAST:  162mL ISOVUE-300 IOPAMIDOL (ISOVUE-300) INJECTION 61% COMPARISON:  None. FINDINGS: Lower chest: No acute abnormality. Hepatobiliary: Multiple gallstones are identified within the gallbladder. No gallbladder wall thickening or pericholecystic fluid is noted. The liver is mildly decreased in attenuation consistent with fatty infiltration. Pancreas: Unremarkable. No pancreatic ductal dilatation or surrounding inflammatory changes. Spleen: Normal in size without focal abnormality. Adrenals/Urinary Tract: Adrenal glands are within normal limits. Kidneys are well visualized bilaterally. No renal calculi or obstructive changes are noted. The bladder is partially distended. Stomach/Bowel: The appendix is not well visualized although no inflammatory changes to suggest appendicitis are seen. Scattered diverticular changes noted. No obstructive or inflammatory changes of the bowel are noted. The stomach is within normal limits. Vascular/Lymphatic: Aortic atherosclerosis. No enlarged abdominal or pelvic lymph nodes. Reproductive: The uterus is within normal limits. The right adnexa is unremarkable. The left ovary has been removed as per the clinical history. No focal fluid collection or postoperative hematoma is seen. Other: No hernia or ascites is noted. There are changes in the anterior abdominal wall consistent with the recent laparoscopy. Musculoskeletal: Degenerative changes of lumbar spine are seen. IMPRESSION: Cholelithiasis without complicating factors. Fatty liver. No acute abnormality to correspond with the clinical history is noted. No postoperative abnormality is seen. Electronically Signed   By: Inez Catalina M.D.   On: 04/03/2018 16:51      ____________________________________________   PROCEDURES  Procedure(s) performed: None Procedures Critical Care performed:  None ____________________________________________   INITIAL IMPRESSION / ASSESSMENT AND PLAN / ED COURSE   57 y.o.  female postop day 1 from left oophorectomy by Dr. Georgianne Fick who presents for evaluation of abdominal pain.  Patient has tenderness to palpation on the right lower quadrant with localized guarding on the right, well-healing surgical scars.  We will do a CT abdomen pelvis to rule out any intra-abdominal postop complications.  We will give morphine for pain.  Labs show leukocytosis with white count of 14.  UA negative for UTI.  Clinical Course as of Apr 03 1948  Wed Apr 03, 2018  1944 CT negative for intra-abdominal complications. Pain is well controlled. Leukocytosis most likely reactive from surgery. Patient tolerating PO. Will dc home, recommended 2 percocets every 4 hours instead of 1 and f/u with surgeon.    [CV]    Clinical Course User Index [CV] Rudene Re, MD     As part of my medical decision making, I  reviewed the following data within the [place] notes reviewed and incorporated, Labs reviewed , Old chart reviewed, Radiograph reviewed , Notes from prior ED visits and [place] Controlled Substance Database    Pertinent labs & imaging results that were available during my care of the patient were reviewed by me and considered in my medical decision making (see chart for details).    ____________________________________________   FINAL CLINICAL IMPRESSION(S) / ED DIAGNOSES  Final diagnoses:  Post-op pain      NEW MEDICATIONS STARTED DURING THIS VISIT:  ED Discharge Orders    None       Note:  This document was prepared using Dragon voice recognition software and may include unintentional dictation errors.    Rudene Re, MD 04/03/18 1950

## 2018-04-03 NOTE — ED Triage Notes (Addendum)
Pt ambulatory to triage.  Pt has right lower abd pain.  Pt had right ovary and cyst removed yesterday by dr Star Age.  Pt continues to have increased pain .  Blood noted on pt's clothing.  Small amount of blood noted on incision. Slight redness noted around incision.  Pt unable to sit. Pt taking rx pain meds for pain.   Pt tearful.

## 2018-04-03 NOTE — Telephone Encounter (Signed)
Pt is calling triage stating Dr.Staebler did her surgery yesterday and she is having to double her pain medication because she stated her pain is 20/10. Please advise in Staeblers absence. Thank you

## 2018-04-04 ENCOUNTER — Telehealth: Payer: Self-pay

## 2018-04-04 ENCOUNTER — Other Ambulatory Visit: Payer: Self-pay | Admitting: Obstetrics and Gynecology

## 2018-04-04 ENCOUNTER — Ambulatory Visit: Payer: BLUE CROSS/BLUE SHIELD | Admitting: Obstetrics and Gynecology

## 2018-04-04 DIAGNOSIS — M5440 Lumbago with sciatica, unspecified side: Secondary | ICD-10-CM

## 2018-04-04 LAB — SURGICAL PATHOLOGY

## 2018-04-04 MED ORDER — LIDOCAINE-PRILOCAINE 2.5-2.5 % EX CREA: 1.0000 "application " | TOPICAL_CREAM | CUTANEOUS | 0 refills | Status: DC | PRN

## 2018-04-04 MED ORDER — OXYCODONE-ACETAMINOPHEN 5-325 MG PO TABS: 2.0000 | ORAL_TABLET | ORAL | 0 refills | Status: DC | PRN

## 2018-04-04 NOTE — Telephone Encounter (Signed)
Please advise 

## 2018-04-04 NOTE — Telephone Encounter (Signed)
Pt states she went to the ER last night, they did not give her anymore pain meds she only has 14 left. They was going to admit her but she does not want to stay in the hospital. She wants to know if [facility] will send her in a refill for 2 pills every 4 hours? She states it takes everything in her to get in her Jeep. She also said she didn't come to her appointment this morning because the nurse told her she didn't have to if she went to the ER. Please advise

## 2018-04-09 ENCOUNTER — Ambulatory Visit: Payer: BLUE CROSS/BLUE SHIELD | Admitting: Obstetrics and Gynecology

## 2018-04-09 ENCOUNTER — Encounter: Payer: Self-pay | Admitting: Obstetrics and Gynecology

## 2018-04-09 ENCOUNTER — Other Ambulatory Visit: Payer: Self-pay

## 2018-04-09 VITALS — BP 136/90 | HR 110 | Wt 326.0 lb

## 2018-04-09 DIAGNOSIS — F331 Major depressive disorder, recurrent, moderate: Secondary | ICD-10-CM

## 2018-04-09 DIAGNOSIS — Z9889 Other specified postprocedural states: Secondary | ICD-10-CM

## 2018-04-09 DIAGNOSIS — Z4889 Encounter for other specified surgical aftercare: Secondary | ICD-10-CM

## 2018-04-09 DIAGNOSIS — M5440 Lumbago with sciatica, unspecified side: Secondary | ICD-10-CM

## 2018-04-09 MED ORDER — DESVENLAFAXINE SUCCINATE ER 100 MG PO TB24: 100.0000 mg | ORAL_TABLET | Freq: Every day | ORAL | 0 refills | Status: DC

## 2018-04-09 MED ORDER — OXYCODONE-ACETAMINOPHEN 5-325 MG PO TABS: 2.0000 | ORAL_TABLET | ORAL | 0 refills | Status: DC | PRN

## 2018-04-09 NOTE — Progress Notes (Signed)
Postoperative Follow-up Patient presents post op from laparoscopic left salpingo-oophorectomy 1weeks ago for pelvic pain.  Subjective: Patient reports some improvement in her preop symptoms. Eating a regular diet without difficulty. Pain is controlled with current analgesics. Medications being used: narcotic analgesics including oxycodone/acetaminophen (Percocet, Tylox).  Activity: still limited in movement secondary to pain at fascial stitch/34m port site.  Was seen at ED and had CT A/P with normal findings.    Objective: Blood pressure 136/90, pulse (!) 110, weight (!) 326 lb (147.9 kg). Body mass index is 48.14 kg/m.   Admission on 04/03/2018, Discharged on 04/03/2018  Component Date Value Ref Range Status  . Sodium 04/03/2018 136  135 - 145 mmol/L Final  . Potassium 04/03/2018 4.4  3.5 - 5.1 mmol/L Final  . Chloride 04/03/2018 101  98 - 111 mmol/L Final  . CO2 04/03/2018 26  22 - 32 mmol/L Final  . Glucose, Bld 04/03/2018 123* 70 - 99 mg/dL Final  . BUN 04/03/2018 20  6 - 20 mg/dL Final  . Creatinine, Ser 04/03/2018 0.85  0.44 - 1.00 mg/dL Final  . Calcium 04/03/2018 9.7  8.9 - 10.3 mg/dL Final  . Total Protein 04/03/2018 7.9  6.5 - 8.1 g/dL Final  . Albumin 04/03/2018 4.3  3.5 - 5.0 g/dL Final  . AST 04/03/2018 24  15 - 41 U/L Final  . ALT 04/03/2018 28  0 - 44 U/L Final  . Alkaline Phosphatase 04/03/2018 79  38 - 126 U/L Final  . Total Bilirubin 04/03/2018 0.5  0.3 - 1.2 mg/dL Final  . GFR calc non Af Amer 04/03/2018 >60  >60 mL/min Final  . GFR calc Af Amer 04/03/2018 >60  >60 mL/min Final   Comment: (NOTE) The eGFR has been calculated using the CKD EPI equation. This calculation has not been validated in all clinical situations. eGFR's persistently <60 mL/min signify possible Chronic Kidney Disease.   .Georgiann Hahngap 04/03/2018 9  5 - 15 Final   Performed at [facility] [address], [place] [address] . WBC 04/03/2018 14.1* 4.0 - 10.5 K/uL  Final  . RBC 04/03/2018 4.72  3.87 - 5.11 MIL/uL Final  . Hemoglobin 04/03/2018 12.6  12.0 - 15.0 g/dL Final  . HCT 04/03/2018 39.5  36.0 - 46.0 % Final  . MCV 04/03/2018 83.7  80.0 - 100.0 fL Final  . MCH 04/03/2018 26.7  26.0 - 34.0 pg Final  . MCHC 04/03/2018 31.9  30.0 - 36.0 g/dL Final  . RDW 04/03/2018 15.3  11.5 - 15.5 % Final  . Platelets 04/03/2018 353  150 - 400 K/uL Final  . nRBC 04/03/2018 0.0  0.0 - 0.2 % Final   Performed at [facility] [address] [place] [id number] . Color, Urine 04/03/2018 YELLOW* YELLOW Final  . APPearance 04/03/2018 CLEAR* CLEAR Final  . Specific Gravity, Urine 04/03/2018 1.014  1.005 - 1.030 Final  . pH 04/03/2018 5.0  5.0 - 8.0 Final  . Glucose, UA 04/03/2018 NEGATIVE  NEGATIVE mg/dL Final  . Hgb urine dipstick 04/03/2018 SMALL* NEGATIVE Final  . Bilirubin Urine 04/03/2018 NEGATIVE  NEGATIVE Final  . Ketones, ur 04/03/2018 NEGATIVE  NEGATIVE mg/dL Final  . Protein, ur 04/03/2018 NEGATIVE  NEGATIVE mg/dL Final  . Nitrite 04/03/2018 NEGATIVE  NEGATIVE Final  . Leukocytes, UA 04/03/2018 NEGATIVE  NEGATIVE Final  . RBC / HPF 04/03/2018 0-5  0 - 5 RBC/hpf Final  . WBC, UA 04/03/2018 0-5  0 - 5 WBC/hpf Final  . Bacteria, UA 04/03/2018 NONE SEEN  NONE SEEN Final  . Squamous Epithelial / LPF 04/03/2018 6-10  0 - 5 Final  . Hyaline Casts, UA 04/03/2018 PRESENT   Final   Performed at [facility], [address], [place], [place] [address]    Assessment: 57 y.o. s/p laparoscopic left salpingo-oophorectomy stable  Plan: Patient has done well after surgery with no apparent complications.  I have discussed the post-operative course to date, and the expected progress moving forward.  The patient understands what complications to be concerned about.  I will see the patient in routine follow up, or sooner if needed.    Activity plan: No heavy lifting.  - Refill percocet - 1 week follow up  Malachy Mood, MD,  [place], [facility] 04/11/2018, 11:38 AM

## 2018-04-09 NOTE — Patient Instructions (Addendum)
Magnesium Citrate or Mira lax   You may also use fleets enema's

## 2018-04-10 ENCOUNTER — Telehealth: Payer: Self-pay

## 2018-04-10 NOTE — Telephone Encounter (Signed)
FMLA/DISABILITY forms (3) for [employer] filled out, signature obtained and given to [place] for processing.

## 2018-04-12 ENCOUNTER — Ambulatory Visit: Payer: Self-pay | Admitting: Nurse Practitioner

## 2018-04-19 ENCOUNTER — Encounter: Payer: Self-pay | Admitting: Obstetrics and Gynecology

## 2018-04-19 ENCOUNTER — Ambulatory Visit (INDEPENDENT_AMBULATORY_CARE_PROVIDER_SITE_OTHER): Payer: BLUE CROSS/BLUE SHIELD | Admitting: Obstetrics and Gynecology

## 2018-04-19 VITALS — BP 132/86 | HR 81 | Wt 304.0 lb

## 2018-04-19 DIAGNOSIS — Z4889 Encounter for other specified surgical aftercare: Secondary | ICD-10-CM

## 2018-04-19 NOTE — Progress Notes (Signed)
Postoperative Follow-up Patient presents post op from laparoscopic left salpingo-oophorectomy 2weeks ago for adnexal mass.  Subjective: Patient reports some improvement in her preop symptoms. Eating a regular diet without difficulty. Pain is controlled without any medications.  Activity: still come limitations with movement.  Objective: Blood pressure 132/86, pulse 81, weight (!) 304 lb (137.9 kg).  General: NAD Pulmonary: no increased work of breathing Abdomen: soft, non-tender, non-distended, incision(s) D/C/I Extremities: no edema Neurologic: normal gait    Admission on 04/03/2018, Discharged on 04/03/2018  Component Date Value Ref Range Status  . Sodium 04/03/2018 136  135 - 145 mmol/L Final  . Potassium 04/03/2018 4.4  3.5 - 5.1 mmol/L Final  . Chloride 04/03/2018 101  98 - 111 mmol/L Final  . CO2 04/03/2018 26  22 - 32 mmol/L Final  . Glucose, Bld 04/03/2018 123* 70 - 99 mg/dL Final  . BUN 04/03/2018 20  6 - 20 mg/dL Final  . Creatinine, Ser 04/03/2018 0.85  0.44 - 1.00 mg/dL Final  . Calcium 04/03/2018 9.7  8.9 - 10.3 mg/dL Final  . Total Protein 04/03/2018 7.9  6.5 - 8.1 g/dL Final  . Albumin 04/03/2018 4.3  3.5 - 5.0 g/dL Final  . AST 04/03/2018 24  15 - 41 U/L Final  . ALT 04/03/2018 28  0 - 44 U/L Final  . Alkaline Phosphatase 04/03/2018 79  38 - 126 U/L Final  . Total Bilirubin 04/03/2018 0.5  0.3 - 1.2 mg/dL Final  . GFR calc non Af Amer 04/03/2018 >60  >60 mL/min Final  . GFR calc Af Amer 04/03/2018 >60  >60 mL/min Final   Comment: (NOTE) The eGFR has been calculated using the CKD EPI equation. This calculation has not been validated in all clinical situations. eGFR's persistently <60 mL/min signify possible Chronic Kidney Disease.   Georgiann Hahn gap 04/03/2018 9  5 - 15 Final   Performed at [facility], [address] [place], [place] [address]  . WBC 04/03/2018 14.1* 4.0 - 10.5 K/uL Final  . RBC 04/03/2018 4.72  3.87 - 5.11 MIL/uL Final  .  Hemoglobin 04/03/2018 12.6  12.0 - 15.0 g/dL Final  . HCT 04/03/2018 39.5  36.0 - 46.0 % Final  . MCV 04/03/2018 83.7  80.0 - 100.0 fL Final  . MCH 04/03/2018 26.7  26.0 - 34.0 pg Final  . MCHC 04/03/2018 31.9  30.0 - 36.0 g/dL Final  . RDW 04/03/2018 15.3  11.5 - 15.5 % Final  . Platelets 04/03/2018 353  150 - 400 K/uL Final  . nRBC 04/03/2018 0.0  0.0 - 0.2 % Final   Performed at [facility], [address] [place], [place] [address]  . Color, Urine 04/03/2018 YELLOW* YELLOW Final  . APPearance 04/03/2018 CLEAR* CLEAR Final  . Specific Gravity, Urine 04/03/2018 1.014  1.005 - 1.030 Final  . pH 04/03/2018 5.0  5.0 - 8.0 Final  . Glucose, UA 04/03/2018 NEGATIVE  NEGATIVE mg/dL Final  . Hgb urine dipstick 04/03/2018 SMALL* NEGATIVE Final  . Bilirubin Urine 04/03/2018 NEGATIVE  NEGATIVE Final  . Ketones, ur 04/03/2018 NEGATIVE  NEGATIVE mg/dL Final  . Protein, ur 04/03/2018 NEGATIVE  NEGATIVE mg/dL Final  . Nitrite 04/03/2018 NEGATIVE  NEGATIVE Final  . Leukocytes, UA 04/03/2018 NEGATIVE  NEGATIVE Final  . RBC / HPF 04/03/2018 0-5  0 - 5 RBC/hpf Final  . WBC, UA 04/03/2018 0-5  0 - 5 WBC/hpf Final  . Bacteria, UA 04/03/2018 NONE SEEN  NONE  SEEN Final  . Squamous Epithelial / LPF 04/03/2018 6-10  0 - 5 Final  . Hyaline Casts, UA 04/03/2018 PRESENT   Final   Performed at [facility], [address] [place], [place] [address]    Assessment: 57 y.o. s/p laparoscopic left salpingo-oophorectomy stable  Plan: Patient has done well after surgery with no apparent complications.  I have discussed the post-operative course to date, and the expected progress moving forward.  The patient understands what complications to be concerned about.  I will see the patient in routine follow up, or sooner if needed.    Activity plan: Back to work 11/4   Malachy Mood, MD, [place], [place] 04/23/2018, 9:32 AM

## 2018-04-23 ENCOUNTER — Telehealth: Payer: Self-pay

## 2018-04-23 NOTE — Telephone Encounter (Signed)
FMLA/DISABILITY additional form for [employer] filled out, signature obtained and given to [place] for processing.

## 2018-04-25 ENCOUNTER — Other Ambulatory Visit: Payer: Self-pay | Admitting: Nurse Practitioner

## 2018-04-25 DIAGNOSIS — M5440 Lumbago with sciatica, unspecified side: Secondary | ICD-10-CM

## 2018-05-02 ENCOUNTER — Other Ambulatory Visit: Payer: Self-pay

## 2018-05-14 ENCOUNTER — Ambulatory Visit: Payer: BLUE CROSS/BLUE SHIELD | Admitting: Obstetrics and Gynecology

## 2018-05-15 ENCOUNTER — Ambulatory Visit: Payer: BLUE CROSS/BLUE SHIELD | Admitting: Obstetrics and Gynecology

## 2018-05-16 ENCOUNTER — Ambulatory Visit: Payer: Self-pay | Admitting: Nurse Practitioner

## 2018-05-16 ENCOUNTER — Encounter: Payer: Self-pay | Admitting: Nurse Practitioner

## 2018-05-16 VITALS — BP 168/78 | HR 72 | Resp 16 | Ht 69.0 in | Wt 355.0 lb

## 2018-05-16 DIAGNOSIS — I10 Essential (primary) hypertension: Secondary | ICD-10-CM

## 2018-05-16 DIAGNOSIS — F331 Major depressive disorder, recurrent, moderate: Secondary | ICD-10-CM | POA: Diagnosis not present

## 2018-05-16 DIAGNOSIS — M5136 Other intervertebral disc degeneration, lumbar region: Secondary | ICD-10-CM

## 2018-05-16 DIAGNOSIS — M5440 Lumbago with sciatica, unspecified side: Secondary | ICD-10-CM

## 2018-05-16 MED ORDER — OXYCODONE-ACETAMINOPHEN 5-325 MG PO TABS: 1.0000 | ORAL_TABLET | ORAL | 0 refills | Status: DC | PRN

## 2018-05-16 NOTE — Progress Notes (Signed)
[facility] [place], [place] [address]  Internal MEDICINE  Office Visit Note  Patient Name: Candace Clayton  [id number]  [id number]  Date of Service: 05/18/2018  Chief Complaint  Patient presents with  . Hypertension  . Anxiety    The patient recently had surgery to remove a large cyst on the left ovary. Removed the left ovary, but was unable to remove the fallopian tube due to complications. He abdomen feels much better, however, she continues to have moderate to sever lower back pain. Recent MRI does show multi-level degenerative disc disease and multiple bulging discs. She will be making appointment with pain management. Had to hold off on this until after she healed from surgery. She will normally take oxycodone/APAP as needed to reduce the pain. Will usually take only 1/2 tablet at a time. Helps to keep her active and productive during routine activities. She needs a refill of this today       Current Medication: Outpatient Encounter Medications as of 05/16/2018  Medication Sig  . cetirizine (ZYRTEC) 10 MG tablet TAKE 1 TABLET(S) BY MOUTH DAILY FOR ALLERGIES (Patient taking differently: Take 10 mg by mouth daily. )  . desvenlafaxine (PRISTIQ) 100 MG 24 hr tablet Take 1 tablet (100 mg total) by mouth daily.  . enalapril (VASOTEC) 20 MG tablet Take 1 tablet (20 mg total) by mouth 2 (two) times daily. (Patient taking differently: Take 40 mg by mouth daily. )  . hydrochlorothiazide (HYDRODIURIL) 25 MG tablet Take 1 tablet (25 mg total) by mouth daily.  Marland Kitchen lidocaine (LIDODERM) 5 % Remove & Discard patch within 12 hours or as directed by MD (Patient taking differently: Place 1 patch onto the skin daily. )  . lidocaine-prilocaine (EMLA) cream Apply 1 application topically as needed.  . Multiple Vitamins-Calcium (ONE-A-DAY WOMENS PO) Take 1 tablet by mouth daily.  Marland Kitchen OVER THE COUNTER MEDICATION 1 tablet daily as needed. Takes generic stool softener for  constipation d/t oxycodone.  Marland Kitchen oxyCODONE-acetaminophen (PERCOCET/ROXICET) 5-325 MG tablet Take 1-2 tablets by mouth every 4 (four) hours as needed for severe pain.  . Pseudoeph-Doxylamine-DM-APAP (NYQUIL PO) Take 2 capsules by mouth at bedtime as needed (for sleep).  . [DISCONTINUED] oxyCODONE-acetaminophen (PERCOCET/ROXICET) 5-325 MG tablet Take 2 tablets by mouth every 4 (four) hours as needed for severe pain.  . [DISCONTINUED] gabapentin (NEURONTIN) 300 MG capsule Take 300 mg by mouth.   No facility-administered encounter medications on file as of 05/16/2018.     Surgical History: Past Surgical History:  Procedure Laterality Date  . CESAREAN SECTION  1992  . COLONOSCOPY     polyps removed first procedure. 2nd time all was clear  . FINGER SURGERY Left 2011   left finger cut off x 2.(only up to last digit)  . HEMATOMA EVACUATION Left 1999   upper part of foot was injured d/t 500lb weight landing on her foot.   Marland Kitchen LAPAROSCOPIC SALPINGO OOPHERECTOMY Left 04/02/2018   Procedure: LAPAROSCOPIC SALPINGO OOPHORECTOMY;  Surgeon: Malachy Mood, MD;  Location: [facility] ORS;  Service: Gynecology;  Laterality: Left;    Medical History: Past Medical History:  Diagnosis Date  . Anginal pain (HCC)    tightness related to anxiety  . Anxiety   . Depression   . GERD (gastroesophageal reflux disease)    throws up easily but not diagnosed with reflux  . History of C-section 10  . Hypertension   . Sleep apnea    uses cpap    Family History: Family History  Problem Relation Age of Onset  . Breast cancer Mother 40       x 3 times  . Colon cancer Mother   . Brain cancer Mother   . Cancer - Colon Mother   . Cancer - Other Mother   . Heart Problems Father   . Colon cancer Brother   . Heart Problems Brother     Social History   Socioeconomic History  . Marital status: Divorced    Spouse name: Not on file  . Number of children: 1  . Years of education: Not on file  . Highest education  level: Not on file  Occupational History  . Occupation: works in Secretary/administrator  Social Needs  . Financial resource strain: Not on file  . Food insecurity:    Worry: Not on file    Inability: Not on file  . Transportation needs:    Medical: Not on file    Non-medical: Not on file  Tobacco Use  . Smoking status: Never Smoker  . Smokeless tobacco: Never Used  Substance and Sexual Activity  . Alcohol use: No    Frequency: Never  . Drug use: No  . Sexual activity: Not Currently    Birth control/protection: Post-menopausal  Lifestyle  . Physical activity:    Days per week: Not on file    Minutes per session: Not on file  . Stress: Not on file  Relationships  . Social connections:    Talks on phone: Not on file    Gets together: Not on file    Attends religious service: Not on file    Active member of club or organization: Not on file    Attends meetings of clubs or organizations: Not on file    Relationship status: Not on file  . Intimate partner violence:    Fear of current or ex partner: Not on file    Emotionally abused: Not on file    Physically abused: Not on file    Forced sexual activity: Not on file  Other Topics Concern  . Not on file  Social History Narrative   Son has schizophrenia and autism but is fully capable of helping mother after surgery      Review of Systems  Constitutional: Positive for fatigue. Negative for activity change, appetite change and unexpected weight change.  HENT: Negative for congestion, postnasal drip, rhinorrhea and sore throat.   Eyes: Negative.   Respiratory: Negative for chest tightness, shortness of breath and wheezing.   Cardiovascular: Negative for chest pain and palpitations.  Gastrointestinal: Negative for abdominal pain, constipation, diarrhea, nausea and vomiting.  Endocrine: Negative for cold intolerance, heat intolerance, polydipsia, polyphagia and polyuria.  Genitourinary: Negative for dysuria, flank pain, frequency  and urgency.  Musculoskeletal: Positive for arthralgias, back pain and myalgias.  Skin: Negative for rash.  Allergic/Immunologic: Negative for environmental allergies.  Neurological: Positive for headaches. Negative for dizziness, weakness and numbness.  Hematological: Negative for adenopathy.  Psychiatric/Behavioral: Positive for behavioral problems and sleep disturbance. The patient is nervous/anxious.     Today's Vitals   05/16/18 1412  BP: (!) 168/78  Pulse: 72  Resp: 16  SpO2: 100%  Weight: (!) 355 lb (161 kg)  Height: 5\' 9"  (1.753 m)    Physical Exam  Constitutional: She is oriented to person, place, and time. She appears well-developed and well-nourished. No distress.  HENT:  Head: Normocephalic and atraumatic.  Nose: Nose normal.  Mouth/Throat: No oropharyngeal exudate.  Eyes: Pupils are equal, round,  and reactive to light. Conjunctivae and EOM are normal.  Neck: Normal range of motion. Neck supple. No JVD present. No tracheal deviation present. No thyromegaly present.  Cardiovascular: Normal rate, regular rhythm and normal heart sounds. Exam reveals no gallop and no friction rub.  No murmur heard. Pulmonary/Chest: Effort normal and breath sounds normal. No respiratory distress. She has no wheezes. She has no rales. She exhibits no tenderness.  Abdominal: Soft. There is tenderness. There is no guarding.  Musculoskeletal: Normal range of motion.  The patient continues to have moderate to severe lower back pain. This pain is worse when bending and twisting at the waist. She has difficult time finding comfortable seated position due to back pain. No visible or palpable bony abnormalities noted.   Lymphadenopathy:    She has no cervical adenopathy.  Neurological: She is alert and oriented to person, place, and time. No cranial nerve deficit.  Skin: Skin is warm and dry. She is not diaphoretic.  Psychiatric: She has a normal mood and affect. Her behavior is normal. Judgment  and thought content normal.  Nursing note and vitals reviewed.  Assessment/Plan: 1. Low back pain with sciatica, sciatica laterality unspecified, unspecified back pain laterality, unspecified chronicity Patient may continue oxycodone/APAP 5/325mg  as needed and as prescribed. Reviewed risk factors and possible side effects associated with taking narcotic pain medication. A new prescription for #45 tablets was sent to her pharmacy. She will be scheduling new patient appointment with pain management,  - oxyCODONE-acetaminophen (PERCOCET/ROXICET) 5-325 MG tablet; Take 1-2 tablets by mouth every 4 (four) hours as needed for severe pain.  Dispense: 45 tablet; Refill: 0  2. Other intervertebral disc degeneration, lumbar region Patient to schedule new patient appointment with pain management for further evaluation and treatment.   3. Essential hypertension Stable. Continue bp medication as prescribed   4. Major depressive disorder, recurrent episode, moderate ([place]) Continue antidepressant therapy as prescribed   General Counseling: Aditri verbalizes understanding of the findings of todays visit and agrees with plan of treatment. I have discussed any further diagnostic evaluation that may be needed or ordered today. We also reviewed her medications today. she has been encouraged to call the office with any questions or concerns that should arise related to todays visit.  Reviewed risks and possible side effects associated with taking opiates, benzodiazepines and other CNS depressants. Combination of these could cause dizziness and drowsiness. Advised patient not to drive or operate machinery when taking these medications, as patient's and other's life can be at risk and will have consequences. Patient verbalized understanding in this matter. Dependence and abuse for these drugs will be monitored closely. A Controlled substance policy and procedure is on file which allows [facility] to order a  urine drug screen test at any visit. Patient understands and agrees with the plan  This patient was seen by Leretha Pol FNP Collaboration with Dr Lavera Guise as a part of collaborative care agreement  Meds ordered this encounter  Medications  . oxyCODONE-acetaminophen (PERCOCET/ROXICET) 5-325 MG tablet    Sig: Take 1-2 tablets by mouth every 4 (four) hours as needed for severe pain.    Dispense:  45 tablet    Refill:  0    Please note change in dosing.    Order Specific Question:   Supervising Provider    Answer:   Lavera Guise [[id number]]    Time spent: 57 Minutes      Dr Lavera Guise Internal medicine

## 2018-05-17 ENCOUNTER — Ambulatory Visit: Payer: BLUE CROSS/BLUE SHIELD | Admitting: Obstetrics and Gynecology

## 2018-05-21 ENCOUNTER — Ambulatory Visit: Payer: BLUE CROSS/BLUE SHIELD | Admitting: Obstetrics and Gynecology

## 2018-06-03 ENCOUNTER — Ambulatory Visit (INDEPENDENT_AMBULATORY_CARE_PROVIDER_SITE_OTHER): Payer: BLUE CROSS/BLUE SHIELD | Admitting: Obstetrics and Gynecology

## 2018-06-03 ENCOUNTER — Other Ambulatory Visit (HOSPITAL_COMMUNITY)
Admission: RE | Admit: 2018-06-03 | Discharge: 2018-06-03 | Disposition: A | Discharge status: Home / Self Care | Payer: BLUE CROSS/BLUE SHIELD | Source: Ambulatory Visit | Attending: Obstetrics and Gynecology | Admitting: Obstetrics and Gynecology

## 2018-06-03 ENCOUNTER — Encounter: Payer: Self-pay | Admitting: Obstetrics and Gynecology

## 2018-06-03 VITALS — BP 132/76 | HR 92 | Wt 333.0 lb

## 2018-06-03 DIAGNOSIS — R1011 Right upper quadrant pain: Secondary | ICD-10-CM | POA: Diagnosis not present

## 2018-06-03 DIAGNOSIS — Z124 Encounter for screening for malignant neoplasm of cervix: Secondary | ICD-10-CM | POA: Diagnosis not present

## 2018-06-03 DIAGNOSIS — Z4889 Encounter for other specified surgical aftercare: Secondary | ICD-10-CM

## 2018-06-03 DIAGNOSIS — R112 Nausea with vomiting, unspecified: Secondary | ICD-10-CM | POA: Diagnosis not present

## 2018-06-03 NOTE — Progress Notes (Signed)
Postoperative Follow-up Patient presents post op from laparoscopic left ovarian cystectomy 6weeks ago for pelvic pain, back pain, left ovarian cyst.  Subjective: Patient reports marked improvement in her preop symptoms. Eating a regular diet with difficulty.  Reports intermittent nausea and RUQ/back pain.   The patient is not having any pain.  Activity: normal activities of daily living.  Objective: Blood pressure 132/76, pulse 92, weight (!) 333 lb (151 kg).  General: NAD Pulmonary: no increased work of breathing Abdomen: soft, non-tender, non-distended, incision(s) D/C/I GU: normal external female genitalia normal cervix, no CMT, uterus normal in shape and contour, no adnexal tenderness or masses, exam limited by habitus Extremities: no edema Neurologic: normal gait    Admission on 04/03/2018, Discharged on 04/03/2018  Component Date Value Ref Range Status  . Sodium 04/03/2018 136  135 - 145 mmol/L Final  . Potassium 04/03/2018 4.4  3.5 - 5.1 mmol/L Final  . Chloride 04/03/2018 101  98 - 111 mmol/L Final  . CO2 04/03/2018 26  22 - 32 mmol/L Final  . Glucose, Bld 04/03/2018 123* 70 - 99 mg/dL Final  . BUN 04/03/2018 20  6 - 20 mg/dL Final  . Creatinine, Ser 04/03/2018 0.85  0.44 - 1.00 mg/dL Final  . Calcium 04/03/2018 9.7  8.9 - 10.3 mg/dL Final  . Total Protein 04/03/2018 7.9  6.5 - 8.1 g/dL Final  . Albumin 04/03/2018 4.3  3.5 - 5.0 g/dL Final  . AST 04/03/2018 24  15 - 41 U/L Final  . ALT 04/03/2018 28  0 - 44 U/L Final  . Alkaline Phosphatase 04/03/2018 79  38 - 126 U/L Final  . Total Bilirubin 04/03/2018 0.5  0.3 - 1.2 mg/dL Final  . GFR calc non Af Amer 04/03/2018 >60  >60 mL/min Final  . GFR calc Af Amer 04/03/2018 >60  >60 mL/min Final   Comment: (NOTE) The eGFR has been calculated using the CKD EPI equation. This calculation has not been validated in all clinical situations. eGFR's persistently <60 mL/min signify possible Chronic Kidney Disease.   Georgiann Hahn gap 04/03/2018 9  5 - 15 Final   Performed at [facility], [address] [place], [place] [address]  . WBC 04/03/2018 14.1* 4.0 - 10.5 K/uL Final  . RBC 04/03/2018 4.72  3.87 - 5.11 MIL/uL Final  . Hemoglobin 04/03/2018 12.6  12.0 - 15.0 g/dL Final  . HCT 04/03/2018 39.5  36.0 - 46.0 % Final  . MCV 04/03/2018 83.7  80.0 - 100.0 fL Final  . MCH 04/03/2018 26.7  26.0 - 34.0 pg Final  . MCHC 04/03/2018 31.9  30.0 - 36.0 g/dL Final  . RDW 04/03/2018 15.3  11.5 - 15.5 % Final  . Platelets 04/03/2018 353  150 - 400 K/uL Final  . nRBC 04/03/2018 0.0  0.0 - 0.2 % Final   Performed at [facility], [address] [place], [place] [address]  . Color, Urine 04/03/2018 YELLOW* YELLOW Final  . APPearance 04/03/2018 CLEAR* CLEAR Final  . Specific Gravity, Urine 04/03/2018 1.014  1.005 - 1.030 Final  . pH 04/03/2018 5.0  5.0 - 8.0 Final  . Glucose, UA 04/03/2018 NEGATIVE  NEGATIVE mg/dL Final  . Hgb urine dipstick 04/03/2018 SMALL* NEGATIVE Final  . Bilirubin Urine 04/03/2018 NEGATIVE  NEGATIVE Final  . Ketones, ur 04/03/2018 NEGATIVE  NEGATIVE mg/dL Final  . Protein, ur 04/03/2018 NEGATIVE  NEGATIVE mg/dL Final  . Nitrite 04/03/2018 NEGATIVE  NEGATIVE Final  . Leukocytes,  UA 04/03/2018 NEGATIVE  NEGATIVE Final  . RBC / HPF 04/03/2018 0-5  0 - 5 RBC/hpf Final  . WBC, UA 04/03/2018 0-5  0 - 5 WBC/hpf Final  . Bacteria, UA 04/03/2018 NONE SEEN  NONE SEEN Final  . Squamous Epithelial / LPF 04/03/2018 6-10  0 - 5 Final  . Hyaline Casts, UA 04/03/2018 PRESENT   Final   Performed at [facility], [address] [place], [place] [address]    Assessment: 57 y.o. s/p laparoscopic left salpingo-oophorectomy stable  Plan: Patient has done well after surgery with no apparent complications.  I have discussed the post-operative course to date, and the expected progress moving forward.  The patient understands what complications to be concerned about.  I will see the  patient in routine follow up, or sooner if needed.    Activity plan: No restriction.  Pap smear obtained  RUQ ultrasound to evaluate gallblader  Return in about 1 year (around 06/04/2019) for annual.     Malachy Mood, MD, [place], [facility] 06/03/2018, 2:11 PM

## 2018-06-04 ENCOUNTER — Encounter: Payer: Self-pay | Admitting: Nurse Practitioner

## 2018-06-04 ENCOUNTER — Ambulatory Visit (INDEPENDENT_AMBULATORY_CARE_PROVIDER_SITE_OTHER): Payer: Self-pay | Admitting: Nurse Practitioner

## 2018-06-04 VITALS — BP 152/90 | HR 87 | Resp 16 | Ht 69.0 in | Wt 346.0 lb

## 2018-06-04 DIAGNOSIS — F331 Major depressive disorder, recurrent, moderate: Secondary | ICD-10-CM | POA: Diagnosis not present

## 2018-06-04 DIAGNOSIS — I10 Essential (primary) hypertension: Secondary | ICD-10-CM

## 2018-06-04 DIAGNOSIS — M5440 Lumbago with sciatica, unspecified side: Secondary | ICD-10-CM | POA: Diagnosis not present

## 2018-06-04 MED ORDER — OXYCODONE-ACETAMINOPHEN 5-325 MG PO TABS: 1.0000 | ORAL_TABLET | ORAL | 0 refills | Status: DC | PRN

## 2018-06-04 NOTE — Progress Notes (Signed)
[facility] [place], [place] [address]  Internal MEDICINE  Office Visit Note  Patient Name: Candace Clayton  [id number]  [id number]  Date of Service: 06/05/2018   Pt is here for a sick visit.  Chief Complaint  Patient presents with  . Hypertension  . Anxiety     The patient is here for sick visit. She she continues to have moderate to sever lower back pain. Recent MRI does show multi-level degenerative disc disease and multiple bulging discs. She has made appointment with pain management provider. Her first appointment is 07/02/2018. She will normally take oxycodone/APAP as needed to reduce the pain. Will usually take only 1/2 tablet at a time. Helps to keep her active and productive during routine activities. She needs a refill of this today. She had post-operative visit yesterday with GYN. Healing well, however, continues to have RUQ abdominal pain which radiates to the right shoulder blade. CT scan done in 03/2018, it did show gallstones without evidence of obstruction. GYN provider has already ordered abdominal ultrasound for further evaluation.         Current Medication:  Outpatient Encounter Medications as of 06/04/2018  Medication Sig  . cetirizine (ZYRTEC) 10 MG tablet TAKE 1 TABLET(S) BY MOUTH DAILY FOR ALLERGIES (Patient taking differently: Take 10 mg by mouth daily. )  . desvenlafaxine (PRISTIQ) 100 MG 24 hr tablet Take 1 tablet (100 mg total) by mouth daily.  . enalapril (VASOTEC) 20 MG tablet Take 1 tablet (20 mg total) by mouth 2 (two) times daily. (Patient taking differently: Take 40 mg by mouth daily. )  . hydrochlorothiazide (HYDRODIURIL) 25 MG tablet Take 1 tablet (25 mg total) by mouth daily.  Marland Kitchen lidocaine (LIDODERM) 5 % Remove & Discard patch within 12 hours or as directed by MD (Patient taking differently: Place 1 patch onto the skin daily. )  . lidocaine-prilocaine (EMLA) cream Apply 1 application topically as needed.  . Multiple  Vitamins-Calcium (ONE-A-DAY WOMENS PO) Take 1 tablet by mouth daily.  Marland Kitchen OVER THE COUNTER MEDICATION 1 tablet daily as needed. Takes generic stool softener for constipation d/t oxycodone.  Marland Kitchen oxyCODONE-acetaminophen (PERCOCET/ROXICET) 5-325 MG tablet Take 1-2 tablets by mouth every 4 (four) hours as needed for severe pain.  . Pseudoeph-Doxylamine-DM-APAP (NYQUIL PO) Take 2 capsules by mouth at bedtime as needed (for sleep).  . [DISCONTINUED] oxyCODONE-acetaminophen (PERCOCET/ROXICET) 5-325 MG tablet Take 1-2 tablets by mouth every 4 (four) hours as needed for severe pain.  . [DISCONTINUED] gabapentin (NEURONTIN) 300 MG capsule Take 300 mg by mouth.   No facility-administered encounter medications on file as of 06/04/2018.       Medical History: Past Medical History:  Diagnosis Date  . Anginal pain (HCC)    tightness related to anxiety  . Anxiety   . Depression   . GERD (gastroesophageal reflux disease)    throws up easily but not diagnosed with reflux  . History of C-section 11  . Hypertension   . Sleep apnea    uses cpap     Today's Vitals   06/04/18 1430  BP: (!) 152/90  Pulse: 87  Resp: 16  SpO2: 98%  Weight: (!) 346 lb (156.9 kg)  Height: 5\' 9"  (1.753 m)    Review of Systems  Constitutional: Positive for fatigue. Negative for activity change, appetite change and unexpected weight change.  HENT: Negative for congestion, postnasal drip, rhinorrhea and sore throat.   Eyes: Negative.   Respiratory: Negative for chest tightness, shortness of breath  and wheezing.   Cardiovascular: Negative for chest pain and palpitations.  Gastrointestinal: Positive for abdominal pain. Negative for constipation, diarrhea, nausea and vomiting.       RUQ tenderness, radiating to the right shoulder blade.   Endocrine: Negative for cold intolerance, heat intolerance, polydipsia, polyphagia and polyuria.  Genitourinary: Negative for dysuria, flank pain, frequency and urgency.   Musculoskeletal: Positive for arthralgias, back pain and myalgias.  Skin: Negative for rash.  Allergic/Immunologic: Negative for environmental allergies.  Neurological: Positive for headaches. Negative for dizziness, weakness and numbness.  Hematological: Negative for adenopathy.  Psychiatric/Behavioral: Positive for behavioral problems and sleep disturbance. The patient is nervous/anxious.     Physical Exam  Constitutional: She is oriented to person, place, and time. She appears well-developed and well-nourished. No distress.  HENT:  Head: Normocephalic and atraumatic.  Mouth/Throat: No oropharyngeal exudate.  Eyes: Pupils are equal, round, and reactive to light. Conjunctivae and EOM are normal.  Neck: Normal range of motion. Neck supple. No JVD present. No tracheal deviation present. No thyromegaly present.  Cardiovascular: Normal rate, regular rhythm and normal heart sounds. Exam reveals no gallop and no friction rub.  No murmur heard. Pulmonary/Chest: Effort normal and breath sounds normal. No respiratory distress. She has no wheezes. She has no rales. She exhibits no tenderness.  Abdominal: Soft. There is tenderness. There is no guarding.  Musculoskeletal: Normal range of motion.  The patient continues to have moderate to severe lower back pain. This pain is worse when bending and twisting at the waist. She has difficult time finding comfortable seated position due to back pain. No visible or palpable bony abnormalities noted. First appointment with pain management is scheduled for 07/02/2018.   Lymphadenopathy:    She has no cervical adenopathy.  Neurological: She is alert and oriented to person, place, and time. No cranial nerve deficit.  Skin: Skin is warm and dry. Capillary refill takes less than 2 seconds. She is not diaphoretic.  Psychiatric: Her speech is normal and behavior is normal. Judgment and thought content normal. Her mood appears anxious. Cognition and memory are normal.  She exhibits a depressed mood.  Nursing note and vitals reviewed.   Assessment/Plan: 1. Low back pain with sciatica, sciatica laterality unspecified, unspecified back pain laterality, unspecified chronicity Patient scheduled to see pain management provider 07/02/2018. This is first available appointment. Will continue her oxycodone/APAP 5/325mg  tablets. She takes 1 to 2 tablets, no more than twice daily when needed for pain. explained to her, that once established with pain management, she will need to get narcotic medications from them. She voiced understanding.  - oxyCODONE-acetaminophen (PERCOCET/ROXICET) 5-325 MG tablet; Take 1-2 tablets by mouth every 4 (four) hours as needed for severe pain.  Dispense: 60 tablet; Refill: 0  2. Essential hypertension Stable. Continue bp medication as prescribe.d   3. Major depressive disorder, recurrent episode, moderate (HCC) Stable. Continue to monitor.   General Counseling: Michiah verbalizes understanding of the findings of todays visit and agrees with plan of treatment. I have discussed any further diagnostic evaluation that may be needed or ordered today. We also reviewed her medications today. she has been encouraged to call the office with any questions or concerns that should arise related to todays visit.    Counseling:  Reviewed risks and possible side effects associated with taking opiates, benzodiazepines and other CNS depressants. Combination of these could cause dizziness and drowsiness. Advised patient not to drive or operate machinery when taking these medications, as patient's and other's life can  be at risk and will have consequences. Patient verbalized understanding in this matter. Dependence and abuse for these drugs will be monitored closely. A Controlled substance policy and procedure is on file which allows [facility] to order a urine drug screen test at any visit. Patient understands and agrees with the plan  This patient  was seen by Leretha Pol FNP Collaboration with Dr Lavera Guise as a part of collaborative care agreement  Meds ordered this encounter  Medications  . oxyCODONE-acetaminophen (PERCOCET/ROXICET) 5-325 MG tablet    Sig: Take 1-2 tablets by mouth every 4 (four) hours as needed for severe pain.    Dispense:  60 tablet    Refill:  0    Please note change in dosing.    Order Specific Question:   Supervising Provider    Answer:   Lavera Guise [[id number]]    Time spent: 25 Minutes

## 2018-06-06 ENCOUNTER — Ambulatory Visit: Admission: RE | Admit: 2018-06-06 | Payer: BLUE CROSS/BLUE SHIELD | Source: Ambulatory Visit

## 2018-06-06 LAB — CYTOLOGY - PAP
Diagnosis: NEGATIVE
HPV (WINDOPATH): NOT DETECTED

## 2018-06-11 ENCOUNTER — Ambulatory Visit: Payer: BLUE CROSS/BLUE SHIELD

## 2018-06-11 ENCOUNTER — Telehealth: Payer: Self-pay

## 2018-06-11 ENCOUNTER — Other Ambulatory Visit: Payer: Self-pay | Admitting: Nurse Practitioner

## 2018-06-11 DIAGNOSIS — M5440 Lumbago with sciatica, unspecified side: Secondary | ICD-10-CM

## 2018-06-11 MED ORDER — PREDNISONE 10 MG (48) PO TBPK: ORAL_TABLET | ORAL | 0 refills | Status: DC

## 2018-06-11 NOTE — Telephone Encounter (Signed)
Pt advised we send prednisone taper

## 2018-06-11 NOTE — Progress Notes (Signed)
Patient c/o severe back pain. Sent in prednisone dose pack. Take as directed for 12 days. Sent to [facility] in [place]

## 2018-06-12 ENCOUNTER — Ambulatory Visit
Admission: RE | Admit: 2018-06-12 | Discharge: 2018-06-12 | Disposition: A | Discharge status: Home / Self Care | Payer: BLUE CROSS/BLUE SHIELD | Source: Ambulatory Visit | Attending: Obstetrics and Gynecology | Admitting: Obstetrics and Gynecology

## 2018-06-12 DIAGNOSIS — K7689 Other specified diseases of liver: Secondary | ICD-10-CM | POA: Diagnosis not present

## 2018-06-12 DIAGNOSIS — K760 Fatty (change of) liver, not elsewhere classified: Secondary | ICD-10-CM | POA: Insufficient documentation

## 2018-06-12 DIAGNOSIS — K8020 Calculus of gallbladder without cholecystitis without obstruction: Secondary | ICD-10-CM | POA: Insufficient documentation

## 2018-06-12 DIAGNOSIS — R1011 Right upper quadrant pain: Secondary | ICD-10-CM | POA: Diagnosis not present

## 2018-06-12 DIAGNOSIS — R112 Nausea with vomiting, unspecified: Secondary | ICD-10-CM | POA: Insufficient documentation

## 2018-06-20 ENCOUNTER — Telehealth: Payer: Self-pay

## 2018-06-20 ENCOUNTER — Other Ambulatory Visit: Payer: Self-pay | Admitting: Nurse Practitioner

## 2018-06-20 DIAGNOSIS — F411 Generalized anxiety disorder: Secondary | ICD-10-CM

## 2018-06-20 MED ORDER — ALPRAZOLAM 0.5 MG PO TABS: 0.5000 mg | ORAL_TABLET | Freq: Three times a day (TID) | ORAL | 3 refills | Status: DC | PRN

## 2018-06-20 NOTE — Progress Notes (Signed)
Sent new prescription for alprazolam 0.5mg  tid prn to [facility] [place]. [facility] continue to be out of [facility]  tablets.

## 2018-06-20 NOTE — Telephone Encounter (Signed)
Sent new prescription for alprazolam 0.5mg  tid prn to [facility]. [facility] continue to be out of 0.[facility]  tablets.

## 2018-06-20 NOTE — Telephone Encounter (Signed)
Informed pt that new rx for alprazolam sent to [facility] [address] [place].

## 2018-06-27 ENCOUNTER — Ambulatory Visit (INDEPENDENT_AMBULATORY_CARE_PROVIDER_SITE_OTHER): Payer: BC Managed Care – PPO | Admitting: Adult Health

## 2018-06-27 ENCOUNTER — Encounter: Payer: Self-pay | Admitting: Adult Health

## 2018-06-27 VITALS — BP 142/80 | HR 77 | Resp 16 | Ht 69.0 in | Wt 337.6 lb

## 2018-06-27 DIAGNOSIS — I10 Essential (primary) hypertension: Secondary | ICD-10-CM | POA: Diagnosis not present

## 2018-06-27 DIAGNOSIS — M5440 Lumbago with sciatica, unspecified side: Secondary | ICD-10-CM | POA: Diagnosis not present

## 2018-06-27 DIAGNOSIS — F411 Generalized anxiety disorder: Secondary | ICD-10-CM

## 2018-06-27 MED ORDER — OXYCODONE-ACETAMINOPHEN 5-325 MG PO TABS: 1.0000 | ORAL_TABLET | ORAL | 0 refills | Status: DC | PRN

## 2018-06-27 NOTE — Progress Notes (Signed)
[facility] [place], [place] [address]  Internal MEDICINE  Office Visit Note  Patient Name: Candace Clayton  [id number]  [id number]  Date of Service: 06/27/2018  Chief Complaint  Patient presents with  . Medication Refill    pain medication    HPI PT is here reporting she needs a refill on pain medication.  [facility] consult on 07/02/2018.  She is initially requesting to refill her oxycodone with 60 tablets and a refill.  We discussed that if she sees pain management as scheduled on the seventh that they would need to prescribe all pain medications that she will be taking then on.  We discussed that they could dismiss her from their care if she used medicines from other providers.  She relies understanding and states that she has to work for the next 4 days and standing up for over 12 hours each shift is impossible without some relief.  She reports he only takes medication with that she works.    Current Medication: Outpatient Encounter Medications as of 06/27/2018  Medication Sig  . ALPRAZolam (XANAX) 0.5 MG tablet Take 1 tablet (0.5 mg total) by mouth 3 (three) times daily as needed for anxiety.  . cetirizine (ZYRTEC) 10 MG tablet TAKE 1 TABLET(S) BY MOUTH DAILY FOR ALLERGIES (Patient taking differently: Take 10 mg by mouth daily. )  . desvenlafaxine (PRISTIQ) 100 MG 24 hr tablet Take 1 tablet (100 mg total) by mouth daily.  . enalapril (VASOTEC) 20 MG tablet Take 1 tablet (20 mg total) by mouth 2 (two) times daily. (Patient taking differently: Take 40 mg by mouth daily. )  . hydrochlorothiazide (HYDRODIURIL) 25 MG tablet Take 1 tablet (25 mg total) by mouth daily.  Marland Kitchen lidocaine (LIDODERM) 5 % Remove & Discard patch within 12 hours or as directed by MD (Patient taking differently: Place 1 patch onto the skin daily. )  . lidocaine-prilocaine (EMLA) cream Apply 1 application topically as needed.  . Multiple Vitamins-Calcium (ONE-A-DAY WOMENS PO) Take 1  tablet by mouth daily.  Marland Kitchen OVER THE COUNTER MEDICATION 1 tablet daily as needed. Takes generic stool softener for constipation d/t oxycodone.  Marland Kitchen oxyCODONE-acetaminophen (PERCOCET/ROXICET) 5-325 MG tablet Take 1-2 tablets by mouth every 4 (four) hours as needed for severe pain.  . predniSONE (STERAPRED UNI-PAK 48 TAB) 10 MG (48) TBPK tablet 12 day taper - take by mouth as directed for 12 days  . Pseudoeph-Doxylamine-DM-APAP (NYQUIL PO) Take 2 capsules by mouth at bedtime as needed (for sleep).  . [DISCONTINUED] gabapentin (NEURONTIN) 300 MG capsule Take 300 mg by mouth.  . [DISCONTINUED] oxyCODONE-acetaminophen (PERCOCET/ROXICET) 5-325 MG tablet Take 1-2 tablets by mouth every 4 (four) hours as needed for severe pain.   No facility-administered encounter medications on file as of 06/27/2018.     Surgical History: Past Surgical History:  Procedure Laterality Date  . CESAREAN SECTION  1992  . COLONOSCOPY     polyps removed first procedure. 2nd time all was clear  . FINGER SURGERY Left 2011   left finger cut off x 2.(only up to last digit)  . HEMATOMA EVACUATION Left 1999   upper part of foot was injured d/t 500lb weight landing on her foot.   Marland Kitchen LAPAROSCOPIC SALPINGO OOPHERECTOMY Left 04/02/2018   Procedure: LAPAROSCOPIC SALPINGO OOPHORECTOMY;  Surgeon: Malachy Mood, MD;  Location: [facility] ORS;  Service: Gynecology;  Laterality: Left;    Medical History: Past Medical History:  Diagnosis Date  . Anginal pain ([place])  tightness related to anxiety  . Anxiety   . Depression   . GERD (gastroesophageal reflux disease)    throws up easily but not diagnosed with reflux  . History of C-section 87  . Hypertension   . Sleep apnea    uses cpap    Family History: Family History  Problem Relation Age of Onset  . Breast cancer Mother 40       x 3 times  . Colon cancer Mother   . Brain cancer Mother   . Cancer - Colon Mother   . Cancer - Other Mother   . Heart Problems Father   . Colon  cancer Brother   . Heart Problems Brother     Social History   Socioeconomic History  . Marital status: Divorced    Spouse name: Not on file  . Number of children: 1  . Years of education: Not on file  . Highest education level: Not on file  Occupational History  . Occupation: works in Secretary/administrator  Social Needs  . Financial resource strain: Not on file  . Food insecurity:    Worry: Not on file    Inability: Not on file  . Transportation needs:    Medical: Not on file    Non-medical: Not on file  Tobacco Use  . Smoking status: Never Smoker  . Smokeless tobacco: Never Used  Substance and Sexual Activity  . Alcohol use: No    Frequency: Never  . Drug use: No  . Sexual activity: Not Currently    Birth control/protection: Post-menopausal  Lifestyle  . Physical activity:    Days per week: Not on file    Minutes per session: Not on file  . Stress: Not on file  Relationships  . Social connections:    Talks on phone: Not on file    Gets together: Not on file    Attends religious service: Not on file    Active member of club or organization: Not on file    Attends meetings of clubs or organizations: Not on file    Relationship status: Not on file  . Intimate partner violence:    Fear of current or ex partner: Not on file    Emotionally abused: Not on file    Physically abused: Not on file    Forced sexual activity: Not on file  Other Topics Concern  . Not on file  Social History Narrative   Son has schizophrenia and autism but is fully capable of helping mother after surgery      Review of Systems  Constitutional: Negative for chills, fatigue and unexpected weight change.  HENT: Negative for congestion, rhinorrhea, sneezing and sore throat.   Eyes: Negative for photophobia, pain and redness.  Respiratory: Negative for cough, chest tightness and shortness of breath.   Cardiovascular: Negative for chest pain and palpitations.  Gastrointestinal: Negative for  abdominal pain, constipation, diarrhea, nausea and vomiting.  Endocrine: Negative.   Genitourinary: Negative for dysuria and frequency.  Musculoskeletal: Negative for arthralgias, back pain, joint swelling and neck pain.  Skin: Negative for rash.  Allergic/Immunologic: Negative.   Neurological: Negative for tremors and numbness.  Hematological: Negative for adenopathy. Does not bruise/bleed easily.  Psychiatric/Behavioral: Negative for behavioral problems and sleep disturbance. The patient is not nervous/anxious.     Vital Signs: BP (!) 142/80   Pulse 77   Resp 16   Ht 5\' 9"  (1.753 m)   Wt (!) 337 lb 9.6 oz (153.1 kg)  SpO2 98%   BMI 49.85 kg/m    Physical Exam Vitals signs and nursing note reviewed.  Constitutional:      General: She is not in acute distress.    Appearance: She is well-developed. She is not diaphoretic.  HENT:     Head: Normocephalic and atraumatic.     Mouth/Throat:     Pharynx: No oropharyngeal exudate.  Eyes:     Pupils: Pupils are equal, round, and reactive to light.  Neck:     Musculoskeletal: Normal range of motion and neck supple.     Thyroid: No thyromegaly.     Vascular: No JVD.     Trachea: No tracheal deviation.  Cardiovascular:     Rate and Rhythm: Normal rate and regular rhythm.     Heart sounds: Normal heart sounds. No murmur. No friction rub. No gallop.   Pulmonary:     Effort: Pulmonary effort is normal. No respiratory distress.     Breath sounds: Normal breath sounds. No wheezing or rales.  Chest:     Chest wall: No tenderness.  Abdominal:     Palpations: Abdomen is soft.     Tenderness: There is no abdominal tenderness. There is no guarding.  Musculoskeletal: Normal range of motion.  Lymphadenopathy:     Cervical: No cervical adenopathy.  Skin:    General: Skin is warm and dry.  Neurological:     Mental Status: She is alert and oriented to person, place, and time.     Cranial Nerves: No cranial nerve deficit.  Psychiatric:         Behavior: Behavior normal.        Thought Content: Thought content normal.        Judgment: Judgment normal.    Assessment/Plan: 1. Low back pain with sciatica, sciatica laterality unspecified, unspecified back pain laterality, unspecified chronicity Refilled patient's oxycodone for 25 tablets to get her to her appointment next week.  At that point pain management will take over and she will no longer be receiving controlled substances from this practice. - oxyCODONE-acetaminophen (PERCOCET/ROXICET) 5-325 MG tablet; Take 1-2 tablets by mouth every 4 (four) hours as needed for severe pain.  Dispense: 25 tablet; Refill: 0  2. GAD (generalized anxiety disorder) Stable, patient seems to be doing well denies any anxiety symptoms currently.  3. Essential hypertension Patient's blood pressure slightly elevated today 142/80.  Likely due to some pain from working.  We will continue to follow to future visits.  4. Morbid obesity ([place]) Obesity Counseling: Risk Assessment: An assessment of behavioral risk factors was made today and includes lack of exercise sedentary lifestyle, lack of portion control and poor dietary habits.  Risk Modification Advice: She was counseled on portion control guidelines. Restricting daily caloric intake to. . The detrimental long term effects of obesity on her health and ongoing poor compliance was also discussed with the patient.    General Counseling: Zosia verbalizes understanding of the findings of todays visit and agrees with plan of treatment. I have discussed any further diagnostic evaluation that may be needed or ordered today. We also reviewed her medications today. she has been encouraged to call the office with any questions or concerns that should arise related to todays visit.    No orders of the defined types were placed in this encounter.   Meds ordered this encounter  Medications  . oxyCODONE-acetaminophen (PERCOCET/ROXICET) 5-325 MG tablet     Sig: Take 1-2 tablets by mouth every 4 (four) hours as needed  for severe pain.    Dispense:  25 tablet    Refill:  0    Please note change in dosing.    Time spent: 20 Minutes   This patient was seen by Orson Gear AGNP-C in Collaboration with Dr Lavera Guise as a part of collaborative care agreement     Kendell Bane AGNP-C Internal medicine

## 2018-07-02 ENCOUNTER — Encounter: Payer: Self-pay | Admitting: Student in an Organized Health Care Education/Training Program

## 2018-07-02 ENCOUNTER — Ambulatory Visit
Payer: BLUE CROSS/BLUE SHIELD | Attending: Student in an Organized Health Care Education/Training Program | Admitting: Student in an Organized Health Care Education/Training Program

## 2018-07-02 VITALS — BP 154/81 | HR 85 | Temp 98.4°F | Resp 16 | Ht 69.0 in | Wt 337.0 lb

## 2018-07-02 DIAGNOSIS — G894 Chronic pain syndrome: Secondary | ICD-10-CM | POA: Diagnosis not present

## 2018-07-02 DIAGNOSIS — M47816 Spondylosis without myelopathy or radiculopathy, lumbar region: Secondary | ICD-10-CM | POA: Diagnosis not present

## 2018-07-02 DIAGNOSIS — B001 Herpesviral vesicular dermatitis: Secondary | ICD-10-CM | POA: Diagnosis not present

## 2018-07-02 DIAGNOSIS — M5136 Other intervertebral disc degeneration, lumbar region: Secondary | ICD-10-CM | POA: Insufficient documentation

## 2018-07-02 MED ORDER — ACYCLOVIR 400 MG PO TABS: 400.0000 mg | ORAL_TABLET | Freq: Three times a day (TID) | ORAL | 0 refills | Status: AC

## 2018-07-02 MED ORDER — GABAPENTIN 300 MG PO CAPS: ORAL_CAPSULE | ORAL | 2 refills | Status: DC

## 2018-07-02 MED ORDER — DICLOFENAC SODIUM 75 MG PO TBEC: 75.0000 mg | DELAYED_RELEASE_TABLET | Freq: Two times a day (BID) | ORAL | 0 refills | Status: AC

## 2018-07-02 NOTE — Progress Notes (Signed)
Patient's Name: Candace Clayton  MRN: [id number]  Referring Provider: Marin Olp, PA-C  DOB: [birth year]  PCP: Ronnell Freshwater, NP  DOS: 07/02/2018  Note by: Gillis Santa, MD  Service setting: Ambulatory outpatient  Specialty: Interventional Pain Management  Location: [facility] (AMB) Pain Management Facility  Visit type: Initial Patient Evaluation  Patient type: New Patient   Primary Reason(s) for Visit: Encounter for initial evaluation of one or more chronic problems (new to examiner) potentially causing chronic pain, and posing a threat to normal musculoskeletal function. (Level of risk: High) CC: Back Pain (lower right ); Hip Pain (right); and Leg Pain (right )  HPI  Candace Clayton is a 58 y.o. year old, female patient, who comes today to see [place] for the first time for an initial evaluation of her chronic pain. She has Sedative, hypnotic, or anxiolytic use, unspecified, uncomplicated; Hypersomnia; Major depressive disorder, recurrent episode, moderate ([place]); Essential hypertension; Morbid obesity ([place]); GAD (generalized anxiety disorder); Urinary tract infection; Lumbago with sciatica, unspecified side; Allergic rhinitis, unspecified; Pain in unspecified knee; Obstructive sleep apnea, adult; Screening for breast cancer; Other intervertebral disc degeneration, lumbar region; and Left ovarian cyst on their problem list. Today she comes in for evaluation of her Back Pain (lower right ); Hip Pain (right); and Leg Pain (right )  Pain Assessment: Location: Lower, Right Back Radiating: into right hip and down the rigth leg into the knee  Onset: More than a month ago Duration: Chronic pain Quality: (S) Discomfort, Constant(stinging) Severity: 2 /10 (subjective, self-reported pain score)  Note: Reported level is compatible with observation.                         When using our objective Pain Scale, levels between 6 and 10/10 are said to belong in an emergency room, as it progressively worsens from a 6/10,  described as severely limiting, requiring emergency care not usually available at an outpatient pain management facility. At a 6/10 level, communication becomes difficult and requires great effort. Assistance to reach the emergency department may be required. Facial flushing and profuse sweating along with potentially dangerous increases in heart rate and blood pressure will be evident. Effect on ADL: when steroids wear off it is difficult for her to walk.  works 12 hour shifts on her feet.   Timing: Constant Modifying factors: steroid taper and oxycodone BP: (!) 154/81  HR: 85  Onset and Duration: Present longer than 3 months Cause of pain: Unknown Severity: No change since onset, NAS-11 at its worse: 10/10, NAS-11 at its best: 2/10 and NAS-11 now: 2/10 Timing: Afternoon and During activity or exercise Aggravating Factors: Bending, Lifiting, Motion, Squatting, Stooping , Twisting and Working Alleviating Factors: Medications Associated Problems: Constipation, Depression, Nausea, Numbness, Spasms, Tingling, Vomiting , Pain that wakes patient up and Pain that does not allow patient to sleep Quality of Pain: Aching, Agonizing, Annoying, Burning, Hot, Sharp, Shooting and Uncomfortable Previous Examinations or Tests: CT scan, MRI scan and X-rays Previous Treatments: Epidural steroid injections  The patient comes into the clinics today for the first time for a chronic pain management evaluation.   58 year old female who presents with a chief complaint of axial low back pain with radiation to her right lateral hip, right posterior lateral thigh and right calf region.  This is been present for greater than 3 months.  Patient's pain has been managed by her primary care providers via oxycodone which she takes every 4 hours on the days  that she works.  Patient works approximately 3 days a week.  She also states that she has tried steroid tapers in the past when her pain has flared which have been moderately  effective.  Patient endorses daily nausea and vomiting which is worse on the days that she takes her oxycodone.  Patient also takes Xanax for anxiety which she wants to avoid taking.  Would recommend against concomitant opioid and benzodiazepine therapy in a patient with morbid obesity such as Ms. Farrelly.  Today I took the time to provide the patient with information regarding my pain practice. The patient was informed that my practice is divided into two sections: an interventional pain management section, as well as a completely separate and distinct medication management section. I explained that I have procedure days for my interventional therapies, and evaluation days for follow-ups and medication management. Because of the amount of documentation required during both, they are kept separated. This means that there is the possibility that she may be scheduled for a procedure on one day, and medication management the next. I have also informed her that because of staffing and facility limitations, I no longer take patients for medication management only. To illustrate the reasons for this, I gave the patient the example of surgeons, and how inappropriate it would be to refer a patient to his/her care, just to write for the post-surgical antibiotics on a surgery done by a different surgeon.   Because interventional pain management is my board-certified specialty, the patient was informed that joining my practice means that they are open to any and all interventional therapies. I made it clear that this does not mean that they will be forced to have any procedures done. What this means is that I believe interventional therapies to be essential part of the diagnosis and proper management of chronic pain conditions. Therefore, patients not interested in these interventional alternatives will be better served under the care of a different practitioner.  The patient was also made aware of my Comprehensive Pain  Management Safety Guidelines where by joining my practice, they limit all of their nerve blocks and joint injections to those done by our practice, for as long as we are retained to manage their care.   Historic Controlled Substance Pharmacotherapy Review  PMP and historical list of controlled substances: Percocet 5 mg, quantity 25, last fill 06/27/2018 Medications: The patient did not bring the medication(s) to the appointment, as requested in our "New Patient Package" Pharmacodynamics: Desired effects: Analgesia: The patient reports 50% benefit. Reported improvement in function: The patient reports medication allows her to accomplish basic ADLs. Clinically meaningful improvement in function (CMIF): Sustained CMIF goals met Perceived effectiveness: Described as relatively effective, allowing for increase in activities of daily living (ADL) Undesirable effects: Side-effects or Adverse reactions: None reported Historical Monitoring: The patient  reports no history of drug use. List of all UDS Test(s): No results found for: MDMA, COCAINSCRNUR, [place], [place], CANNABQUANT, [place], [place] List of other Serum/Urine Drug Screening Test(s):  No results found for: AMPHSCRSER, BARBSCRSER, BENZOSCRSER, COCAINSCRSER, COCAINSCRNUR, PCPSCRSER, PCPQUANT, THCSCRSER, THCU, CANNABQUANT, OPIATESCRSER, OXYSCRSER, PROPOXSCRSER, ETH Historical Background Evaluation: [place] PMP: Six (6) year initial data search conducted.             [place] Department of public safety, offender search: Editor, commissioning Information) Non-contributory Risk Assessment Profile: Aberrant behavior: None observed or detected today Risk factors for fatal opioid overdose: Benzodiazepine use, caucasian, concomitant use of Benzodiazepines and sleep apnea Fatal overdose hazard ratio (HR): Calculation  deferred Non-fatal overdose hazard ratio (HR): Calculation deferred Risk of opioid abuse or dependence: 0.7-3.0% with doses ? 36 MME/day and 6.1-26% with doses ?  120 MME/day. Substance use disorder (SUD) risk level: See below Personal History of Substance Abuse (SUD-Substance use disorder):  Alcohol: Negative  Illegal Drugs: Negative  Rx Drugs: Negative  ORT Risk Level calculation: Low Risk Opioid Risk Tool - 07/02/18 1104      Family History of Substance Abuse   Alcohol  Negative    Illegal Drugs  Negative    Rx Drugs  Negative      Personal History of Substance Abuse   Alcohol  Negative    Illegal Drugs  Negative    Rx Drugs  Negative      Age   Age between 50-45 years   No      Psychological Disease   Psychological Disease  Negative    Depression  Negative      Total Score   Opioid Risk Tool Scoring  0    Opioid Risk Interpretation  Low Risk      ORT Scoring interpretation table:  Score <3 = Low Risk for SUD  Score between 4-7 = Moderate Risk for SUD  Score >8 = High Risk for Opioid Abuse   PHQ-2 Depression Scale:  Total score:    PHQ-2 Scoring interpretation table: (Score and probability of major depressive disorder)  Score 0 = No depression  Score 1 = 15.4% Probability  Score 2 = 21.1% Probability  Score 3 = 38.4% Probability  Score 4 = 45.5% Probability  Score 5 = 56.4% Probability  Score 6 = 78.6% Probability   PHQ-9 Depression Scale:  Total score:    PHQ-9 Scoring interpretation table:  Score 0-4 = No depression  Score 5-9 = Mild depression  Score 10-14 = Moderate depression  Score 15-19 = Moderately severe depression  Score 20-27 = Severe depression (2.4 times higher risk of SUD and 2.89 times higher risk of overuse)   Pharmacologic Plan: No opioid analgesics.            Initial impression: High risk for opiate therapy.  Morbidly obese, signs of obstructive sleep apnea  Meds   Current Outpatient Medications:  .  ALPRAZolam (XANAX) 0.5 MG tablet, Take 1 tablet (0.5 mg total) by mouth 3 (three) times daily as needed for anxiety., Disp: 90 tablet, Rfl: 3 .  cetirizine (ZYRTEC) 10 MG tablet, TAKE 1 TABLET(S)  BY MOUTH DAILY FOR ALLERGIES (Patient taking differently: Take 10 mg by mouth daily. ), Disp: 30 tablet, Rfl: 4 .  desvenlafaxine (PRISTIQ) 100 MG 24 hr tablet, Take 1 tablet (100 mg total) by mouth daily., Disp: 90 tablet, Rfl: 0 .  enalapril (VASOTEC) 20 MG tablet, Take 1 tablet (20 mg total) by mouth 2 (two) times daily. (Patient taking differently: Take 40 mg by mouth daily. ), Disp: 60 tablet, Rfl: 5 .  hydrochlorothiazide (HYDRODIURIL) 25 MG tablet, Take 1 tablet (25 mg total) by mouth daily., Disp: 30 tablet, Rfl: 5 .  Multiple Vitamins-Calcium (ONE-A-DAY WOMENS PO), Take 1 tablet by mouth daily., Disp: , Rfl:  .  OVER THE COUNTER MEDICATION, 1 tablet daily as needed. Takes generic stool softener for constipation d/t oxycodone., Disp: , Rfl:  .  acyclovir (ZOVIRAX) 400 MG tablet, Take 1 tablet (400 mg total) by mouth 3 (three) times daily for 7 days., Disp: 21 tablet, Rfl: 0 .  diclofenac (VOLTAREN) 75 MG EC tablet, Take 1 tablet (75 mg  total) by mouth 2 (two) times daily for 21 days., Disp: 42 tablet, Rfl: 0 .  gabapentin (NEURONTIN) 300 MG capsule, 300 mg qhs x 1 week then 600 mg qhs x 1 week, then 300 mg qAM, and 600 mg qhs, Disp: 90 capsule, Rfl: 2 .  Pseudoeph-Doxylamine-DM-APAP (NYQUIL PO), Take 2 capsules by mouth at bedtime as needed (for sleep)., Disp: , Rfl:   Imaging Review   Lumbosacral Imaging: Lumbar MR wo contrast:  Results for orders placed during the hospital encounter of 02/15/18  MR Lumbar Spine Wo Contrast   Narrative CLINICAL DATA:  Low back pain for 3 years.  No known injury.  EXAM: MRI LUMBAR SPINE WITHOUT CONTRAST  TECHNIQUE: Multiplanar, multisequence MR imaging of the lumbar spine was performed. No intravenous contrast was administered.  COMPARISON:  Plain films lumbar spine 10/26/2017.  FINDINGS: Segmentation:  Standard.  Alignment: Facet mediated 0.4 cm anterolisthesis L4 on L5 is identified. Trace retrolisthesis T12 on L1 is also noted. There  is exaggeration of the normal lumbar lordosis.  Vertebrae:  No fracture or worrisome lesion.  Conus medullaris and cauda equina: Conus extends to the T12-L1 level. Conus and cauda equina appear normal.  Paraspinal and other soft tissues: Stones measuring up to 1.9 cm are seen in the gallbladder. A partially visualized cystic lesion in the left pelvis measures 5.5 cm in diameter.  Disc levels:  T10-11 and T11-12 are imaged in the sagittal plane only. There is loss of disc space height and a shallow bulge at T11-12. The disc appears slightly deform the ventral cord although the central canal appears open. Mild loss of disc space height T11-12 and a minimal bulge without stenosis also noted.  T12-L1: Shallow broad-based central protrusion with caudal extension. The central canal and foramina are open.  L1-2: Mild disc bulge without stenosis.  L2-3: Mild disc bulge and facet arthropathy without stenosis.  L3-4: Minimal disc bulge and mild facet degenerative disease without stenosis.  L4-5: Advanced bilateral facet degenerative change is seen. The disc is uncovered with a shallow bulge. Mild central canal narrowing is present. The foramina are open.  L5-S1: Bilateral facet degenerative disease and a shallow disc bulge are identified without central canal or foraminal stenosis. Far left paravertebral endplate spur incidentally noted.  IMPRESSION: Lumbar spondylosis most notable at L4-5 where advanced facet degenerative disease results in 0.4 cm anterolisthesis. There is mild central canal narrowing at this level. No nerve root compression.  Shallow broad-based disc bulge imaged in the sagittal plane only at T11-12 and slightly deforms the ventral cord but the central canal appears open at this level  5.5 cm cystic lesion left pelvis is partially imaged and could be ovarian in origin. Recommend pelvic ultrasound for further evaluation. This recommendation follows ACR  consensus guidelines: White Paper of the ACR Incidental Findings Committee II on Adnexal Findings. J Am Coll Radiol [phone].  Gallstones.   Electronically Signed   By: Inge Rise M.D.   On: 02/15/2018 11:18     Results for orders placed during the hospital encounter of 10/26/17  DG Lumbar Spine Complete    Complexity Note: Imaging results reviewed. Results shared with Ms. Delany, using [employer].                         ROS  Cardiovascular: High blood pressure Pulmonary or Respiratory: Temporary stoppage of breathing during sleep Neurological: No reported neurological signs or symptoms such as seizures, abnormal skin sensations, urinary  and/or fecal incontinence, being born with an abnormal open spine and/or a tethered spinal cord Review of Past Neurological Studies: No results found for this or any previous visit. Psychological-Psychiatric: Anxiousness, Depressed and Prone to panicking Gastrointestinal: No reported gastrointestinal signs or symptoms such as vomiting or evacuating blood, reflux, heartburn, alternating episodes of diarrhea and constipation, inflamed or scarred liver, or pancreas or irrregular and/or infrequent bowel movements Genitourinary: No reported renal or genitourinary signs or symptoms such as difficulty voiding or producing urine, peeing blood, non-functioning kidney, kidney stones, difficulty emptying the bladder, difficulty controlling the flow of urine, or chronic kidney disease Hematological: No reported hematological signs or symptoms such as prolonged bleeding, low or poor functioning platelets, bruising or bleeding easily, hereditary bleeding problems, low energy levels due to low hemoglobin or being anemic Endocrine: No reported endocrine signs or symptoms such as high or low blood sugar, rapid heart rate due to high thyroid levels, obesity or weight gain due to slow thyroid or thyroid disease Rheumatologic: No reported rheumatological  signs and symptoms such as fatigue, joint pain, tenderness, swelling, redness, heat, stiffness, decreased range of motion, with or without associated rash Musculoskeletal: Negative for myasthenia gravis, muscular dystrophy, multiple sclerosis or malignant hyperthermia Work History: Working full time  Allergies  Ms. Hewins has No Known Allergies.  Laboratory Chemistry  Inflammation Markers (CRP: Acute Phase) (ESR: Chronic Phase) No results found for: CRP, ESRSEDRATE, LATICACIDVEN                       Rheumatology Markers No results found for: RF, ANA, LABURIC, URICUR, LYMEIGGIGMAB, LYMEABIGMQN, HLAB27                      Renal Function Markers Lab Results  Component Value Date   BUN 20 04/03/2018   CREATININE 0.85 04/03/2018   GFRAA >60 04/03/2018   GFRNONAA >60 04/03/2018                             Hepatic Function Markers Lab Results  Component Value Date   AST 24 04/03/2018   ALT 28 04/03/2018   ALBUMIN 4.3 04/03/2018   ALKPHOS 79 04/03/2018                        Electrolytes Lab Results  Component Value Date   NA 136 04/03/2018   K 4.4 04/03/2018   CL 101 04/03/2018   CALCIUM 9.7 04/03/2018                        Neuropathy Markers Lab Results  Component Value Date   HGBA1C 5.6 12/30/2014                        CNS Tests No results found for: COLORCSF, APPEARCSF, RBCCOUNTCSF, WBCCSF, POLYSCSF, LYMPHSCSF, EOSCSF, PROTEINCSF, GLUCCSF, JCVIRUS, CSFOLI, IGGCSF                      Bone Pathology Markers Lab Results  Component Value Date   VD25OH 22.5 (L) 03/10/2016                         Coagulation Parameters Lab Results  Component Value Date   PLT 353 04/03/2018  Cardiovascular Markers Lab Results  Component Value Date   HGB 12.6 04/03/2018   HCT 39.5 04/03/2018                         CA Markers Lab Results  Component Value Date   CEA 1.0 08/28/2016   CA125 8.1 08/28/2016                        Note: Lab  results reviewed.  PFSH  Drug: Ms. Bruns  reports no history of drug use. Alcohol:  reports no history of alcohol use. Tobacco:  reports that she has never smoked. She has never used smokeless tobacco. Medical:  has a past medical history of Anginal pain ([place]), Anxiety, Depression, GERD (gastroesophageal reflux disease), History of C-section (1992), Hypertension, and Sleep apnea. Family: family history includes Brain cancer in her mother; Breast cancer (age of onset: 56) in her mother; Cancer - Colon in her mother; Cancer - Other in her mother; Colon cancer in her brother and mother; Heart Problems in her brother and father.  Past Surgical History:  Procedure Laterality Date  . CESAREAN SECTION  1992  . COLONOSCOPY     polyps removed first procedure. 2nd time all was clear  . FINGER SURGERY Left 2011   left finger cut off x 2.(only up to last digit)  . HEMATOMA EVACUATION Left 1999   upper part of foot was injured d/t 500lb weight landing on her foot.   Marland Kitchen LAPAROSCOPIC SALPINGO OOPHERECTOMY Left 04/02/2018   Procedure: LAPAROSCOPIC SALPINGO OOPHORECTOMY;  Surgeon: Malachy Mood, MD;  Location: [facility] ORS;  Service: Gynecology;  Laterality: Left;   Active Ambulatory Problems    Diagnosis Date Noted  . Sedative, hypnotic, or anxiolytic use, unspecified, uncomplicated 83/38/2505  . Hypersomnia 06/27/2017  . Major depressive disorder, recurrent episode, moderate ([place]) 06/27/2017  . Essential hypertension 06/27/2017  . Morbid obesity ([place]) 06/27/2017  . GAD (generalized anxiety disorder) 06/27/2017  . Urinary tract infection 06/27/2017  . Lumbago with sciatica, unspecified side 07/03/2017  . Allergic rhinitis, unspecified 07/03/2017  . Pain in unspecified knee 07/03/2017  . Obstructive sleep apnea, adult 07/03/2017  . Screening for breast cancer 10/24/2017  . Other intervertebral disc degeneration, lumbar region 12/23/2017  . Left ovarian cyst 03/03/2018   Resolved Ambulatory  Problems    Diagnosis Date Noted  . No Resolved Ambulatory Problems   Past Medical History:  Diagnosis Date  . Anginal pain ([place])   . Anxiety   . Depression   . GERD (gastroesophageal reflux disease)   . History of C-section 61  . Hypertension   . Sleep apnea    Constitutional Exam  General appearance: alert, cooperative and morbidly obese Vitals:   07/02/18 1058  BP: (!) 154/81  Pulse: 85  Resp: 16  Temp: 98.4 F (36.9 C)  TempSrc: Oral  SpO2: 98%  Weight: (!) 337 lb (152.9 kg)  Height: 5' 9"  (1.753 m)   BMI Assessment: Estimated body mass index is 49.77 kg/m as calculated from the following:   Height as of this encounter: 5' 9"  (1.753 m).   Weight as of this encounter: 337 lb (152.9 kg).  BMI interpretation table: BMI level Category Range association with higher incidence of chronic pain  <18 kg/m2 Underweight   18.5-24.9 kg/m2 Ideal body weight   25-29.9 kg/m2 Overweight Increased incidence by 20%  30-34.9 kg/m2 Obese (Class I) Increased incidence by 68%  35-39.9 kg/m2 Severe  obesity (Class II) Increased incidence by 136%  >40 kg/m2 Extreme obesity (Class III) Increased incidence by 254%   Patient's current BMI Ideal Body weight  Body mass index is 49.77 kg/m. Ideal body weight: 66.2 kg (145 lb 15.1 oz) Adjusted ideal body weight: 100.9 kg (222 lb 5.9 oz)   BMI Readings from Last 4 Encounters:  07/02/18 49.77 kg/m  06/27/18 49.85 kg/m  06/04/18 51.10 kg/m  06/03/18 49.18 kg/m   Wt Readings from Last 4 Encounters:  07/02/18 (!) 337 lb (152.9 kg)  06/27/18 (!) 337 lb 9.6 oz (153.1 kg)  06/04/18 (!) 346 lb (156.9 kg)  06/03/18 (!) 333 lb (151 kg)  Psych/Mental status: Alert, oriented x 3 (person, place, & time)       Eyes: PERLA Respiratory: No evidence of acute respiratory distress  Cervical Spine Area Exam  Skin & Axial Inspection: No masses, redness, edema, swelling, or associated skin lesions Alignment: Symmetrical Functional ROM:  Unrestricted ROM      Stability: No instability detected Muscle Tone/Strength: Functionally intact. No obvious neuro-muscular anomalies detected. Sensory (Neurological): Unimpaired Palpation: No palpable anomalies              Upper Extremity (UE) Exam    Side: Right upper extremity  Side: Left upper extremity  Skin & Extremity Inspection: Skin color, temperature, and hair growth are WNL. No peripheral edema or cyanosis. No masses, redness, swelling, asymmetry, or associated skin lesions. No contractures.  Skin & Extremity Inspection: Skin color, temperature, and hair growth are WNL. No peripheral edema or cyanosis. No masses, redness, swelling, asymmetry, or associated skin lesions. No contractures.  Functional ROM: Unrestricted ROM          Functional ROM: Unrestricted ROM          Muscle Tone/Strength: Functionally intact. No obvious neuro-muscular anomalies detected.  Muscle Tone/Strength: Functionally intact. No obvious neuro-muscular anomalies detected.  Sensory (Neurological): Unimpaired          Sensory (Neurological): Unimpaired          Palpation: No palpable anomalies              Palpation: No palpable anomalies              Provocative Test(s):  Phalen's test: deferred Tinel's test: deferred Apley's scratch test (touch opposite shoulder):  Action 1 (Across chest): deferred Action 2 (Overhead): deferred Action 3 (LB reach): deferred   Provocative Test(s):  Phalen's test: deferred Tinel's test: deferred Apley's scratch test (touch opposite shoulder):  Action 1 (Across chest): deferred Action 2 (Overhead): deferred Action 3 (LB reach): deferred    Thoracic Spine Area Exam  Skin & Axial Inspection: No masses, redness, or swelling Alignment: Symmetrical Functional ROM: Unrestricted ROM Stability: No instability detected Muscle Tone/Strength: Functionally intact. No obvious neuro-muscular anomalies detected. Sensory (Neurological): Unimpaired Muscle strength & Tone: No  palpable anomalies  Lumbar Spine Area Exam  Skin & Axial Inspection: No masses, redness, or swelling Alignment: Symmetrical Functional ROM: Decreased ROM affecting primarily the right Stability: No instability detected Muscle Tone/Strength: Functionally intact. No obvious neuro-muscular anomalies detected. Sensory (Neurological): Articular pain pattern Palpation: No palpable anomalies       Provocative Tests: Hyperextension/rotation test: (+) on the right for facet joint pain. Lumbar quadrant test (Kemp's test): (+) on the right for facet joint pain. Lateral bending test: deferred today       Patrick's Maneuver: deferred today  FABER* test: deferred today                   S-I anterior distraction/compression test: deferred today         S-I lateral compression test: deferred today         S-I Thigh-thrust test: deferred today         S-I Gaenslen's test: deferred today         *(Flexion, ABduction and External Rotation)  Gait & Posture Assessment  Ambulation: Unassisted Gait: Relatively normal for age and body habitus Posture: WNL   Lower Extremity Exam    Side: Right lower extremity  Side: Left lower extremity  Stability: No instability observed          Stability: No instability observed          Skin & Extremity Inspection: Skin color, temperature, and hair growth are WNL. No peripheral edema or cyanosis. No masses, redness, swelling, asymmetry, or associated skin lesions. No contractures.  Skin & Extremity Inspection: Skin color, temperature, and hair growth are WNL. No peripheral edema or cyanosis. No masses, redness, swelling, asymmetry, or associated skin lesions. No contractures.  Functional ROM: Unrestricted ROM                  Functional ROM: Unrestricted ROM                  Muscle Tone/Strength: Functionally intact. No obvious neuro-muscular anomalies detected.  Muscle Tone/Strength: Functionally intact. No obvious neuro-muscular anomalies detected.   Sensory (Neurological): Unimpaired        Sensory (Neurological): Unimpaired        DTR: Patellar: deferred today Achilles: deferred today Plantar: deferred today  DTR: Patellar: deferred today Achilles: deferred today Plantar: deferred today  Palpation: No palpable anomalies  Palpation: No palpable anomalies   Assessment  Primary Diagnosis & Pertinent Problem List: The primary encounter diagnosis was Lumbar facet arthropathy (R>L). Diagnoses of Lumbar spondylosis, Lumbar degenerative disc disease, Morbid obesity ([place]), Chronic pain syndrome, and Primary herpes simplex infection of lips were also pertinent to this visit.  Visit Diagnosis (New problems to examiner): 1. Lumbar facet arthropathy (R>L)   2. Lumbar spondylosis   3. Lumbar degenerative disc disease   4. Morbid obesity ([place])   5. Chronic pain syndrome   6. Primary herpes simplex infection of lips    General Recommendations: The pain condition that the patient suffers from is best treated with a multidisciplinary approach that involves an increase in physical activity to prevent de-conditioning and worsening of the pain cycle, as well as psychological counseling (formal and/or informal) to address the co-morbid psychological affects of pain. Treatment will often involve judicious use of pain medications and interventional procedures to decrease the pain, allowing the patient to participate in the physical activity that will ultimately produce long-lasting pain reductions. The goal of the multidisciplinary approach is to return the patient to a higher level of overall function and to restore their ability to perform activities of daily living.  ARDELL AARONSON has a history of greater than 3 months of moderate to severe pain which is resulted in functional impairment.  The patient has tried various conservative therapeutic options such as NSAIDs, Tylenol, muscle relaxants, physical therapy which was inadequately effective.   Patient's pain is predominantly axial with physical exam findings suggestive of facet arthropathy. Lumbar facet medial branch nerve blocks were discussed with the patient.  Risks and benefits were reviewed.  Patient would like to  proceed with RIGHT L3, L4, L5, S1 medial branch nerve block.   In regards to medication management, patient will not be a candidate for chronic opioid therapy at this clinic given her concomitant use of Xanax.  Patient states that she prefers to be off opioid medications anyway as she believes are contributing to her nausea and vomiting.  She also endorses cognitive issues and feelings of dizziness when she takes them at work.  I recommend the patient wean herself off these medications.  We will focus primarily on non-opioid analgesics and interventional pain management.    Plan: -UDS today, should be positive for Xanax and oxycodone -Schedule for diagnostic right L3, L4, L5, S1 medial branch nerve blocks for lumbar spondylosis and facet arthropathy -Discontinue all NSAIDs and trial of diclofenac 75 mg twice daily for 3 weeks -Start gabapentin as below -Acyclovir for HSV 1 flare  Ordered Lab-work, Procedure(s), Referral(s), & Consult(s): Orders Placed This Encounter  Procedures  . LUMBAR FACET(MEDIAL BRANCH NERVE BLOCK) MBNB  . Compliance Drug Analysis, Ur   Pharmacotherapy (current): Medications ordered:  Meds ordered this encounter  Medications  . diclofenac (VOLTAREN) 75 MG EC tablet    Sig: Take 1 tablet (75 mg total) by mouth 2 (two) times daily for 21 days.    Dispense:  42 tablet    Refill:  0  . gabapentin (NEURONTIN) 300 MG capsule    Sig: 300 mg qhs x 1 week then 600 mg qhs x 1 week, then 300 mg qAM, and 600 mg qhs    Dispense:  90 capsule    Refill:  2  . acyclovir (ZOVIRAX) 400 MG tablet    Sig: Take 1 tablet (400 mg total) by mouth 3 (three) times daily for 7 days.    Dispense:  21 tablet    Refill:  0   Medications administered during this  visit: Christiana G. Mcduffey had no medications administered during this visit.   Pharmacological management options:  Opioid Analgesics: We will focus on non-opioid analgesics  Membrane stabilizer: To be determined at a later time  Muscle relaxant: To be determined at a later time  NSAID: To be determined at a later time  Other analgesic(s): To be determined at a later time   Interventional management options: Ms. Vaeth was informed that there is no guarantee that she would be a candidate for interventional therapies. The decision will be based on the results of diagnostic studies, as well as Ms. Icenhower's risk profile.  Procedure(s) under consideration:  Right lumbar facet medial branch nerve blocks Right SI joint injection   Provider-requested follow-up: Return in about 2 weeks (around 07/16/2018) for Procedure.  Future Appointments  Date Time Provider [place]  07/15/2018 10:30 AM Allyne Gee, MD [facility] None  08/16/2018  2:15 PM Ronnell Freshwater, NP [place] None    Primary Care Physician: Ronnell Freshwater, NP Location: [facility] Note by: Gillis Santa, M.D, Date: 07/02/2018; Time: 1:29 PM  Patient Instructions  ____________________________________________________________________________________________  General Risks and Possible Complications  Patient Responsibilities: It is important that you read this as it is part of your informed consent. It is our duty to inform you of the risks and possible complications associated with treatments offered to you. It is your responsibility as a patient to read this and to ask questions about anything that is not clear or that you believe was not covered in this document.  Patient's Rights: You have the right to refuse treatment.  You also have the right to change your mind, even after initially having agreed to have the treatment done. However, under this last option, if you wait until the last  second to change your mind, you may be charged for the materials used up to that point.  Introduction: Medicine is not an Chief Strategy Officer. Everything in Medicine, including the lack of treatment(s), carries the potential for danger, harm, or loss (which is by definition: Risk). In Medicine, a complication is a secondary problem, condition, or disease that can aggravate an already existing one. All treatments carry the risk of possible complications. The fact that a side effects or complications occurs, does not imply that the treatment was conducted incorrectly. It must be clearly understood that these can happen even when everything is done following the highest safety standards.  No treatment: You can choose not to proceed with the proposed treatment alternative. The "PRO(s)" would include: avoiding the risk of complications associated with the therapy. The "CON(s)" would include: not getting any of the treatment benefits. These benefits fall under one of three categories: diagnostic; therapeutic; and/or palliative. Diagnostic benefits include: getting information which can ultimately lead to improvement of the disease or symptom(s). Therapeutic benefits are those associated with the successful treatment of the disease. Finally, palliative benefits are those related to the decrease of the primary symptoms, without necessarily curing the condition (example: decreasing the pain from a flare-up of a chronic condition, such as incurable terminal cancer).  General Risks and Complications: These are associated to most interventional treatments. They can occur alone, or in combination. They fall under one of the following six (6) categories: no benefit or worsening of symptoms; bleeding; infection; nerve damage; allergic reactions; and/or death. 1. No benefits or worsening of symptoms: In Medicine there are no guarantees, only probabilities. No healthcare provider can ever guarantee that a medical treatment will  work, they can only state the probability that it may. Furthermore, there is always the possibility that the condition may worsen, either directly, or indirectly, as a consequence of the treatment. 2. Bleeding: This is more common if the patient is taking a blood thinner, either prescription or over the counter (example: Goody Powders, Fish oil, Aspirin, Garlic, etc.), or if suffering a condition associated with impaired coagulation (example: Hemophilia, cirrhosis of the liver, low platelet counts, etc.). However, even if you do not have one on these, it can still happen. If you have any of these conditions, or take one of these drugs, make sure to notify your treating physician. 3. Infection: This is more common in patients with a compromised immune system, either due to disease (example: diabetes, cancer, human immunodeficiency virus [HIV], etc.), or due to medications or treatments (example: therapies used to treat cancer and rheumatological diseases). However, even if you do not have one on these, it can still happen. If you have any of these conditions, or take one of these drugs, make sure to notify your treating physician. 4. Nerve Damage: This is more common when the treatment is an invasive one, but it can also happen with the use of medications, such as those used in the treatment of cancer. The damage can occur to small secondary nerves, or to large primary ones, such as those in the spinal cord and brain. This damage may be temporary or permanent and it may lead to impairments that can range from temporary numbness to permanent paralysis and/or brain death. 5. Allergic Reactions: Any time a substance or material comes in contact  with our body, there is the possibility of an allergic reaction. These can range from a mild skin rash (contact dermatitis) to a severe systemic reaction (anaphylactic reaction), which can result in death. 6. Death: In general, any medical intervention can result in death,  most of the time due to an unforeseen complication. ____________________________________________________________________________________________  ____________________________________________________________________________________________  Preparing for Procedure with Sedation  Instructions: . Oral Intake: Do not eat or drink anything for at least 8 hours prior to your procedure. . Transportation: Public transportation is not allowed. Bring an adult driver. The driver must be physically present in our waiting room before any procedure can be started. Marland Kitchen Physical Assistance: Bring an adult physically capable of assisting you, in the event you need help. This adult should keep you company at home for at least 6 hours after the procedure. . Blood Pressure Medicine: Take your blood pressure medicine with a sip of water the morning of the procedure. . Blood thinners: Notify our staff if you are taking any blood thinners. Depending on which one you take, there will be specific instructions on how and when to stop it. . Diabetics on insulin: Notify the staff so that you can be scheduled 1st case in the morning. If your diabetes requires high dose insulin, take only  of your normal insulin dose the morning of the procedure and notify the staff that you have done so. . Preventing infections: Shower with an antibacterial soap the morning of your procedure. . Build-up your immune system: Take 1000 mg of Vitamin C with every meal (3 times a day) the day prior to your procedure. Marland Kitchen Antibiotics: Inform the staff if you have a condition or reason that requires you to take antibiotics before dental procedures. . Pregnancy: If you are pregnant, call and cancel the procedure. . Sickness: If you have a cold, fever, or any active infections, call and cancel the procedure. . Arrival: You must be in the facility at least 30 minutes prior to your scheduled procedure. . Children: Do not bring children with you. . Dress  appropriately: Bring dark clothing that you would not mind if they get stained. . Valuables: Do not bring any jewelry or valuables.  Procedure appointments are reserved for interventional treatments only. Marland Kitchen No Prescription Refills. . No medication changes will be discussed during procedure appointments. . No disability issues will be discussed.  Reasons to call and reschedule or cancel your procedure: (Following these recommendations will minimize the risk of a serious complication.) . Surgeries: Avoid having procedures within 2 weeks of any surgery. (Avoid for 2 weeks before or after any surgery). . Flu Shots: Avoid having procedures within 2 weeks of a flu shots or . (Avoid for 2 weeks before or after immunizations). . Barium: Avoid having a procedure within 7-10 days after having had a radiological study involving the use of radiological contrast. (Myelograms, Barium swallow or enema study). . Heart attacks: Avoid any elective procedures or surgeries for the initial 6 months after a "Myocardial Infarction" (Heart Attack). . Blood thinners: It is imperative that you stop these medications before procedures. Let us know if you if you take any blood thinner.  . Infection: Avoid procedures during or within two weeks of an infection (including chest colds or gastrointestinal problems). Symptoms associated with infections include: Localized redness, fever, chills, night sweats or profuse sweating, burning sensation when voiding, cough, congestion, stuffiness, runny nose, sore throat, diarrhea, nausea, vomiting, cold or Flu symptoms, recent or current infections. It is specially important  if the infection is over the area that we intend to treat. Marland Kitchen Heart and lung problems: Symptoms that may suggest an active cardiopulmonary problem include: cough, chest pain, breathing difficulties or shortness of breath, dizziness, ankle swelling, uncontrolled high or unusually low blood pressure, and/or palpitations. If  you are experiencing any of these symptoms, cancel your procedure and contact your primary care physician for an evaluation.  Remember:  Regular Business hours are:  Monday to Thursday 8:00 AM to 4:00 PM  Provider's Schedule: Milinda Pointer, MD:  Procedure days: Tuesday and Thursday 7:30 AM to 4:00 PM  Gillis Santa, MD:  Procedure days: Monday and Wednesday 7:30 AM to 4:00 PM ____________________________________________________________________________________________  ____________________________________________________________________________________________  Preparing for Procedure with Sedation  Instructions: . Oral Intake: Do not eat or drink anything for at least 8 hours prior to your procedure. . Transportation: Public transportation is not allowed. Bring an adult driver. The driver must be physically present in our waiting room before any procedure can be started. Marland Kitchen Physical Assistance: Bring an adult physically capable of assisting you, in the event you need help. This adult should keep you company at home for at least 6 hours after the procedure. . Blood Pressure Medicine: Take your blood pressure medicine with a sip of water the morning of the procedure. . Blood thinners: Notify our staff if you are taking any blood thinners. Depending on which one you take, there will be specific instructions on how and when to stop it. . Diabetics on insulin: Notify the staff so that you can be scheduled 1st case in the morning. If your diabetes requires high dose insulin, take only  of your normal insulin dose the morning of the procedure and notify the staff that you have done so. . Preventing infections: Shower with an antibacterial soap the morning of your procedure. . Build-up your immune system: Take 1000 mg of Vitamin C with every meal (3 times a day) the day prior to your procedure. Marland Kitchen Antibiotics: Inform the staff if you have a condition or reason that requires you to take  antibiotics before dental procedures. . Pregnancy: If you are pregnant, call and cancel the procedure. . Sickness: If you have a cold, fever, or any active infections, call and cancel the procedure. . Arrival: You must be in the facility at least 30 minutes prior to your scheduled procedure. . Children: Do not bring children with you. . Dress appropriately: Bring dark clothing that you would not mind if they get stained. . Valuables: Do not bring any jewelry or valuables.  Procedure appointments are reserved for interventional treatments only. Marland Kitchen No Prescription Refills. . No medication changes will be discussed during procedure appointments. . No disability issues will be discussed.  Reasons to call and reschedule or cancel your procedure: (Following these recommendations will minimize the risk of a serious complication.) . Surgeries: Avoid having procedures within 2 weeks of any surgery. (Avoid for 2 weeks before or after any surgery). . Flu Shots: Avoid having procedures within 2 weeks of a flu shots or . (Avoid for 2 weeks before or after immunizations). . Barium: Avoid having a procedure within 7-10 days after having had a radiological study involving the use of radiological contrast. (Myelograms, Barium swallow or enema study). . Heart attacks: Avoid any elective procedures or surgeries for the initial 6 months after a "Myocardial Infarction" (Heart Attack). . Blood thinners: It is imperative that you stop these medications before procedures. Let us know if you if you take  any blood thinner.  . Infection: Avoid procedures during or within two weeks of an infection (including chest colds or gastrointestinal problems). Symptoms associated with infections include: Localized redness, fever, chills, night sweats or profuse sweating, burning sensation when voiding, cough, congestion, stuffiness, runny nose, sore throat, diarrhea, nausea, vomiting, cold or Flu symptoms, recent or current infections.  It is specially important if the infection is over the area that we intend to treat. Marland Kitchen Heart and lung problems: Symptoms that may suggest an active cardiopulmonary problem include: cough, chest pain, breathing difficulties or shortness of breath, dizziness, ankle swelling, uncontrolled high or unusually low blood pressure, and/or palpitations. If you are experiencing any of these symptoms, cancel your procedure and contact your primary care physician for an evaluation.  Remember:  Regular Business hours are:  Monday to Thursday 8:00 AM to 4:00 PM  Provider's Schedule: Milinda Pointer, MD:  Procedure days: Tuesday and Thursday 7:30 AM to 4:00 PM  Gillis Santa, MD:  Procedure days: Monday and Wednesday 7:30 AM to 4:00 PM ____________________________________________________________________________________________  Facet Blocks Patient Information  Description: The facets are joints in the spine between the vertebrae.  Like any joints in the body, facets can become irritated and painful.  Arthritis can also effect the facets.  By injecting steroids and local anesthetic in and around these joints, we can temporarily block the nerve supply to them.  Steroids act directly on irritated nerves and tissues to reduce selling and inflammation which often leads to decreased pain.  Facet blocks may be done anywhere along the spine from the neck to the low back depending upon the location of your pain.   After numbing the skin with local anesthetic (like Novocaine), a small needle is passed onto the facet joints under x-ray guidance.  You may experience a sensation of pressure while this is being done.  The entire block usually lasts about 15-25 minutes.   Conditions which may be treated by facet blocks:   Low back/buttock pain  Neck/shoulder pain  Certain types of headaches  Preparation for the injection:  1. Do not eat any solid food or dairy products within 8 hours of your appointment. 2. You  may drink clear liquid up to 3 hours before appointment.  Clear liquids include water, black coffee, juice or soda.  No milk or cream please. 3. You may take your regular medication, including pain medications, with a sip of water before your appointment.  Diabetics should hold regular insulin (if taken separately) and take 1/2 normal NPH dose the morning of the procedure.  Carry some sugar containing items with you to your appointment. 4. A driver must accompany you and be prepared to drive you home after your procedure. 5. Bring all your current medications with you. 6. An IV may be inserted and sedation may be given at the discretion of the physician. 7. A blood pressure cuff, EKG and other monitors will often be applied during the procedure.  Some patients may need to have extra oxygen administered for a short period. 8. You will be asked to provide medical information, including your allergies and medications, prior to the procedure.  We must know immediately if you are taking blood thinners (like Coumadin/Warfarin) or if you are allergic to IV iodine contrast (dye).  We must know if you could possible be pregnant.  Possible side-effects:   Bleeding from needle site  Infection (rare, may require surgery)  Nerve injury (rare)  Numbness & tingling (temporary)  Difficulty urinating (rare, temporary)  Spinal headache (a headache worse with upright posture)  Light-headedness (temporary)  Pain at injection site (serveral days)  Decreased blood pressure (rare, temporary)  Weakness in arm/leg (temporary)  Pressure sensation in back/neck (temporary)   Call if you experience:   Fever/chills associated with headache or increased back/neck pain  Headache worsened by an upright position  New onset, weakness or numbness of an extremity below the injection site  Hives or difficulty breathing (go to the emergency room)  Inflammation or drainage at the injection site(s)  Severe  back/neck pain greater than usual  New symptoms which are concerning to you  Please note:  Although the local anesthetic injected can often make your back or neck feel good for several hours after the injection, the pain will likely return. It takes 3-7 days for steroids to work.  You may not notice any pain relief for at least one week.  If effective, we will often do a series of 2-3 injections spaced 3-6 weeks apart to maximally decrease your pain.  After the initial series, you may be a candidate for a more permanent nerve block of the facets.  If you have any questions, please call #[phone]) [facility]

## 2018-07-02 NOTE — Patient Instructions (Signed)
____________________________________________________________________________________________  General Risks and Possible Complications  Patient Responsibilities: It is important that you read this as it is part of your informed consent. It is our duty to inform you of the risks and possible complications associated with treatments offered to you. It is your responsibility as a patient to read this and to ask questions about anything that is not clear or that you believe was not covered in this document.  Patient's Rights: You have the right to refuse treatment. You also have the right to change your mind, even after initially having agreed to have the treatment done. However, under this last option, if you wait until the last second to change your mind, you may be charged for the materials used up to that point.  Introduction: Medicine is not an exact science. Everything in Medicine, including the lack of treatment(s), carries the potential for danger, harm, or loss (which is by definition: Risk). In Medicine, a complication is a secondary problem, condition, or disease that can aggravate an already existing one. All treatments carry the risk of possible complications. The fact that a side effects or complications occurs, does not imply that the treatment was conducted incorrectly. It must be clearly understood that these can happen even when everything is done following the highest safety standards.  No treatment: You can choose not to proceed with the proposed treatment alternative. The "PRO(s)" would include: avoiding the risk of complications associated with the therapy. The "CON(s)" would include: not getting any of the treatment benefits. These benefits fall under one of three categories: diagnostic; therapeutic; and/or palliative. Diagnostic benefits include: getting information which can ultimately lead to improvement of the disease or symptom(s). Therapeutic benefits are those associated with the  successful treatment of the disease. Finally, palliative benefits are those related to the decrease of the primary symptoms, without necessarily curing the condition (example: decreasing the pain from a flare-up of a chronic condition, such as incurable terminal cancer).  General Risks and Complications: These are associated to most interventional treatments. They can occur alone, or in combination. They fall under one of the following six (6) categories: no benefit or worsening of symptoms; bleeding; infection; nerve damage; allergic reactions; and/or death. 1. No benefits or worsening of symptoms: In Medicine there are no guarantees, only probabilities. No healthcare provider can ever guarantee that a medical treatment will work, they can only state the probability that it may. Furthermore, there is always the possibility that the condition may worsen, either directly, or indirectly, as a consequence of the treatment. 2. Bleeding: This is more common if the patient is taking a blood thinner, either prescription or over the counter (example: Goody Powders, Fish oil, Aspirin, Garlic, etc.), or if suffering a condition associated with impaired coagulation (example: Hemophilia, cirrhosis of the liver, low platelet counts, etc.). However, even if you do not have one on these, it can still happen. If you have any of these conditions, or take one of these drugs, make sure to notify your treating physician. 3. Infection: This is more common in patients with a compromised immune system, either due to disease (example: diabetes, cancer, human immunodeficiency virus [HIV], etc.), or due to medications or treatments (example: therapies used to treat cancer and rheumatological diseases). However, even if you do not have one on these, it can still happen. If you have any of these conditions, or take one of these drugs, make sure to notify your treating physician. 4. Nerve Damage: This is more common when the   treatment is  an invasive one, but it can also happen with the use of medications, such as those used in the treatment of cancer. The damage can occur to small secondary nerves, or to large primary ones, such as those in the spinal cord and brain. This damage may be temporary or permanent and it may lead to impairments that can range from temporary numbness to permanent paralysis and/or brain death. 5. Allergic Reactions: Any time a substance or material comes in contact with our body, there is the possibility of an allergic reaction. These can range from a mild skin rash (contact dermatitis) to a severe systemic reaction (anaphylactic reaction), which can result in death. 6. Death: In general, any medical intervention can result in death, most of the time due to an unforeseen complication. ____________________________________________________________________________________________  ____________________________________________________________________________________________  Preparing for Procedure with Sedation  Instructions: . Oral Intake: Do not eat or drink anything for at least 8 hours prior to your procedure. . Transportation: Public transportation is not allowed. Bring an adult driver. The driver must be physically present in our waiting room before any procedure can be started. Marland Kitchen Physical Assistance: Bring an adult physically capable of assisting you, in the event you need help. This adult should keep you company at home for at least 6 hours after the procedure. . Blood Pressure Medicine: Take your blood pressure medicine with a sip of water the morning of the procedure. . Blood thinners: Notify our staff if you are taking any blood thinners. Depending on which one you take, there will be specific instructions on how and when to stop it. . Diabetics on insulin: Notify the staff so that you can be scheduled 1st case in the morning. If your diabetes requires high dose insulin, take only  of your normal  insulin dose the morning of the procedure and notify the staff that you have done so. . Preventing infections: Shower with an antibacterial soap the morning of your procedure. . Build-up your immune system: Take 1000 mg of Vitamin C with every meal (3 times a day) the day prior to your procedure. Marland Kitchen Antibiotics: Inform the staff if you have a condition or reason that requires you to take antibiotics before dental procedures. . Pregnancy: If you are pregnant, call and cancel the procedure. . Sickness: If you have a cold, fever, or any active infections, call and cancel the procedure. . Arrival: You must be in the facility at least 30 minutes prior to your scheduled procedure. . Children: Do not bring children with you. . Dress appropriately: Bring dark clothing that you would not mind if they get stained. . Valuables: Do not bring any jewelry or valuables.  Procedure appointments are reserved for interventional treatments only. Marland Kitchen No Prescription Refills. . No medication changes will be discussed during procedure appointments. . No disability issues will be discussed.  Reasons to call and reschedule or cancel your procedure: (Following these recommendations will minimize the risk of a serious complication.) . Surgeries: Avoid having procedures within 2 weeks of any surgery. (Avoid for 2 weeks before or after any surgery). . Flu Shots: Avoid having procedures within 2 weeks of a flu shots or . (Avoid for 2 weeks before or after immunizations). . Barium: Avoid having a procedure within 7-10 days after having had a radiological study involving the use of radiological contrast. (Myelograms, Barium swallow or enema study). . Heart attacks: Avoid any elective procedures or surgeries for the initial 6 months after a "Myocardial Infarction" (Heart Attack). Marland Kitchen  Blood thinners: It is imperative that you stop these medications before procedures. Let us know if you if you take any blood thinner.  . Infection:  Avoid procedures during or within two weeks of an infection (including chest colds or gastrointestinal problems). Symptoms associated with infections include: Localized redness, fever, chills, night sweats or profuse sweating, burning sensation when voiding, cough, congestion, stuffiness, runny nose, sore throat, diarrhea, nausea, vomiting, cold or Flu symptoms, recent or current infections. It is specially important if the infection is over the area that we intend to treat. Marland Kitchen Heart and lung problems: Symptoms that may suggest an active cardiopulmonary problem include: cough, chest pain, breathing difficulties or shortness of breath, dizziness, ankle swelling, uncontrolled high or unusually low blood pressure, and/or palpitations. If you are experiencing any of these symptoms, cancel your procedure and contact your primary care physician for an evaluation.  Remember:  Regular Business hours are:  Monday to Thursday 8:00 AM to 4:00 PM  Provider's Schedule: Milinda Pointer, MD:  Procedure days: Tuesday and Thursday 7:30 AM to 4:00 PM  Gillis Santa, MD:  Procedure days: Monday and Wednesday 7:30 AM to 4:00 PM ____________________________________________________________________________________________  ____________________________________________________________________________________________  Preparing for Procedure with Sedation  Instructions: . Oral Intake: Do not eat or drink anything for at least 8 hours prior to your procedure. . Transportation: Public transportation is not allowed. Bring an adult driver. The driver must be physically present in our waiting room before any procedure can be started. Marland Kitchen Physical Assistance: Bring an adult physically capable of assisting you, in the event you need help. This adult should keep you company at home for at least 6 hours after the procedure. . Blood Pressure Medicine: Take your blood pressure medicine with a sip of water the morning of the  procedure. . Blood thinners: Notify our staff if you are taking any blood thinners. Depending on which one you take, there will be specific instructions on how and when to stop it. . Diabetics on insulin: Notify the staff so that you can be scheduled 1st case in the morning. If your diabetes requires high dose insulin, take only  of your normal insulin dose the morning of the procedure and notify the staff that you have done so. . Preventing infections: Shower with an antibacterial soap the morning of your procedure. . Build-up your immune system: Take 1000 mg of Vitamin C with every meal (3 times a day) the day prior to your procedure. Marland Kitchen Antibiotics: Inform the staff if you have a condition or reason that requires you to take antibiotics before dental procedures. . Pregnancy: If you are pregnant, call and cancel the procedure. . Sickness: If you have a cold, fever, or any active infections, call and cancel the procedure. . Arrival: You must be in the facility at least 30 minutes prior to your scheduled procedure. . Children: Do not bring children with you. . Dress appropriately: Bring dark clothing that you would not mind if they get stained. . Valuables: Do not bring any jewelry or valuables.  Procedure appointments are reserved for interventional treatments only. Marland Kitchen No Prescription Refills. . No medication changes will be discussed during procedure appointments. . No disability issues will be discussed.  Reasons to call and reschedule or cancel your procedure: (Following these recommendations will minimize the risk of a serious complication.) . Surgeries: Avoid having procedures within 2 weeks of any surgery. (Avoid for 2 weeks before or after any surgery). . Flu Shots: Avoid having procedures within 2 weeks  of a flu shots or . (Avoid for 2 weeks before or after immunizations). . Barium: Avoid having a procedure within 7-10 days after having had a radiological study involving the use of  radiological contrast. (Myelograms, Barium swallow or enema study). . Heart attacks: Avoid any elective procedures or surgeries for the initial 6 months after a "Myocardial Infarction" (Heart Attack). . Blood thinners: It is imperative that you stop these medications before procedures. Let us know if you if you take any blood thinner.  . Infection: Avoid procedures during or within two weeks of an infection (including chest colds or gastrointestinal problems). Symptoms associated with infections include: Localized redness, fever, chills, night sweats or profuse sweating, burning sensation when voiding, cough, congestion, stuffiness, runny nose, sore throat, diarrhea, nausea, vomiting, cold or Flu symptoms, recent or current infections. It is specially important if the infection is over the area that we intend to treat. Marland Kitchen Heart and lung problems: Symptoms that may suggest an active cardiopulmonary problem include: cough, chest pain, breathing difficulties or shortness of breath, dizziness, ankle swelling, uncontrolled high or unusually low blood pressure, and/or palpitations. If you are experiencing any of these symptoms, cancel your procedure and contact your primary care physician for an evaluation.  Remember:  Regular Business hours are:  Monday to Thursday 8:00 AM to 4:00 PM  Provider's Schedule: Milinda Pointer, MD:  Procedure days: Tuesday and Thursday 7:30 AM to 4:00 PM  Gillis Santa, MD:  Procedure days: Monday and Wednesday 7:30 AM to 4:00 PM ____________________________________________________________________________________________  Facet Blocks Patient Information  Description: The facets are joints in the spine between the vertebrae.  Like any joints in the body, facets can become irritated and painful.  Arthritis can also effect the facets.  By injecting steroids and local anesthetic in and around these joints, we can temporarily block the nerve supply to them.  Steroids act  directly on irritated nerves and tissues to reduce selling and inflammation which often leads to decreased pain.  Facet blocks may be done anywhere along the spine from the neck to the low back depending upon the location of your pain.   After numbing the skin with local anesthetic (like Novocaine), a small needle is passed onto the facet joints under x-ray guidance.  You may experience a sensation of pressure while this is being done.  The entire block usually lasts about 15-25 minutes.   Conditions which may be treated by facet blocks:   Low back/buttock pain  Neck/shoulder pain  Certain types of headaches  Preparation for the injection:  1. Do not eat any solid food or dairy products within 8 hours of your appointment. 2. You may drink clear liquid up to 3 hours before appointment.  Clear liquids include water, black coffee, juice or soda.  No milk or cream please. 3. You may take your regular medication, including pain medications, with a sip of water before your appointment.  Diabetics should hold regular insulin (if taken separately) and take 1/2 normal NPH dose the morning of the procedure.  Carry some sugar containing items with you to your appointment. 4. A driver must accompany you and be prepared to drive you home after your procedure. 5. Bring all your current medications with you. 6. An IV may be inserted and sedation may be given at the discretion of the physician. 7. A blood pressure cuff, EKG and other monitors will often be applied during the procedure.  Some patients may need to have extra oxygen administered for a  short period. 8. You will be asked to provide medical information, including your allergies and medications, prior to the procedure.  We must know immediately if you are taking blood thinners (like Coumadin/Warfarin) or if you are allergic to IV iodine contrast (dye).  We must know if you could possible be pregnant.  Possible side-effects:   Bleeding from needle  site  Infection (rare, may require surgery)  Nerve injury (rare)  Numbness & tingling (temporary)  Difficulty urinating (rare, temporary)  Spinal headache (a headache worse with upright posture)  Light-headedness (temporary)  Pain at injection site (serveral days)  Decreased blood pressure (rare, temporary)  Weakness in arm/leg (temporary)  Pressure sensation in back/neck (temporary)   Call if you experience:   Fever/chills associated with headache or increased back/neck pain  Headache worsened by an upright position  New onset, weakness or numbness of an extremity below the injection site  Hives or difficulty breathing (go to the emergency room)  Inflammation or drainage at the injection site(s)  Severe back/neck pain greater than usual  New symptoms which are concerning to you  Please note:  Although the local anesthetic injected can often make your back or neck feel good for several hours after the injection, the pain will likely return. It takes 3-7 days for steroids to work.  You may not notice any pain relief for at least one week.  If effective, we will often do a series of 2-3 injections spaced 3-6 weeks apart to maximally decrease your pain.  After the initial series, you may be a candidate for a more permanent nerve block of the facets.  If you have any questions, please call #[phone]) [facility]

## 2018-07-02 NOTE — Progress Notes (Signed)
Safety precautions to be maintained throughout the outpatient stay will include: orient to surroundings, keep bed in low position, maintain call bell within reach at all times, provide assistance with transfer out of bed and ambulation.  

## 2018-07-05 LAB — COMPLIANCE DRUG ANALYSIS, UR

## 2018-07-08 ENCOUNTER — Other Ambulatory Visit: Payer: Self-pay

## 2018-07-08 DIAGNOSIS — F331 Major depressive disorder, recurrent, moderate: Secondary | ICD-10-CM

## 2018-07-08 MED ORDER — DESVENLAFAXINE SUCCINATE ER 100 MG PO TB24: 100.0000 mg | ORAL_TABLET | Freq: Every day | ORAL | 0 refills | Status: DC

## 2018-07-15 ENCOUNTER — Telehealth: Payer: Self-pay | Admitting: *Deleted

## 2018-07-15 ENCOUNTER — Ambulatory Visit (INDEPENDENT_AMBULATORY_CARE_PROVIDER_SITE_OTHER): Payer: BC Managed Care – PPO | Admitting: Internal Medicine

## 2018-07-15 ENCOUNTER — Ambulatory Visit: Payer: BLUE CROSS/BLUE SHIELD | Admitting: Internal Medicine

## 2018-07-15 ENCOUNTER — Encounter: Payer: Self-pay | Admitting: Internal Medicine

## 2018-07-15 ENCOUNTER — Ambulatory Visit: Payer: BLUE CROSS/BLUE SHIELD | Admitting: Student in an Organized Health Care Education/Training Program

## 2018-07-15 VITALS — BP 150/87 | HR 88 | Resp 16 | Ht 69.0 in | Wt 346.0 lb

## 2018-07-15 DIAGNOSIS — G4733 Obstructive sleep apnea (adult) (pediatric): Secondary | ICD-10-CM | POA: Diagnosis not present

## 2018-07-15 DIAGNOSIS — Z9989 Dependence on other enabling machines and devices: Secondary | ICD-10-CM

## 2018-07-15 DIAGNOSIS — R0602 Shortness of breath: Secondary | ICD-10-CM

## 2018-07-15 DIAGNOSIS — R21 Rash and other nonspecific skin eruption: Secondary | ICD-10-CM | POA: Diagnosis not present

## 2018-07-15 DIAGNOSIS — J301 Allergic rhinitis due to pollen: Secondary | ICD-10-CM

## 2018-07-15 MED ORDER — HYDROXYZINE HCL 10 MG PO TABS: 10.0000 mg | ORAL_TABLET | Freq: Three times a day (TID) | ORAL | 0 refills | Status: DC | PRN

## 2018-07-15 NOTE — Telephone Encounter (Signed)
We will need to speak with patient about this. She is prescribed several medications from Dr. Holley Raring and we need to see which one and the issue she is having. Please have nurse see her when she returns.

## 2018-07-15 NOTE — Patient Instructions (Signed)
Rash, Adult    A rash is a change in the color of your skin. A rash can also change the way your skin feels. There are many different conditions and factors that can cause a rash.  Follow these instructions at home:  The goal of treatment is to stop the itching and keep the rash from spreading. Watch for any changes in your symptoms. Let your doctor know about them. Follow these instructions to help with your condition:  Medicine  Take or apply over-the-counter and prescription medicines only as told by your doctor. These may include medicines:   To treat red or swollen skin (corticosteroid creams).   To treat itching.   To treat an allergy (oral antihistamines).   To treat very bad symptoms (oral corticosteroids).    Skin care   Put cool cloths (compresses) on the affected areas.   Do not scratch or rub your skin.   Avoid covering the rash. Make sure that the rash is exposed to air as much as possible.  Managing itching and discomfort   Avoid hot showers or baths. These can make itching worse. A cold shower may help.   Try taking a bath with:  ? Epsom salts. You can get these at your local pharmacy or grocery store. Follow the instructions on the package.  ? Baking soda. Pour a small amount into the bath as told by your doctor.  ? Colloidal oatmeal. You can get this at your local pharmacy or grocery store. Follow the instructions on the package.   Try putting baking soda paste onto your skin. Stir water into baking soda until it gets like a paste.   Try putting on a lotion that relieves itchiness (calamine lotion).   Keep cool and out of the sun. Sweating and being hot can make itching worse.  General instructions     Rest as needed.   Drink enough fluid to keep your pee (urine) pale yellow.   Wear loose-fitting clothing.   Avoid scented soaps, detergents, and perfumes. Use gentle soaps, detergents, perfumes, and other cosmetic products.   Avoid anything that causes your rash. Keep a journal to  help track what causes your rash. Write down:  ? What you eat.  ? What cosmetic products you use.  ? What you drink.  ? What you wear. This includes jewelry.   Keep all follow-up visits as told by your doctor. This is important.  Contact a doctor if:   You sweat at night.   You lose weight.   You pee (urinate) more than normal.   You pee less than normal, or you notice that your pee is a darker color than normal.   You feel weak.   You throw up (vomit).   Your skin or the whites of your eyes look yellow (jaundice).   Your skin:  ? Tingles.  ? Is numb.   Your rash:  ? Does not go away after a few days.  ? Gets worse.   You are:  ? More thirsty than normal.  ? More tired than normal.   You have:  ? New symptoms.  ? Pain in your belly (abdomen).  ? A fever.  ? Watery poop (diarrhea).  Get help right away if:   You have a fever and your symptoms suddenly get worse.   You start to feel mixed up (confused).   You have a very bad headache or a stiff neck.   You have very bad joint pains   or stiffness.   You have jerky movements that you cannot control (seizure).   Your rash covers all or most of your body. The rash may or may not be painful.   You have blisters that:  ? Are on top of the rash.  ? Grow larger.  ? Grow together.  ? Are painful.  ? Are inside your nose or mouth.   You have a rash that:  ? Looks like purple pinprick-sized spots all over your body.  ? Has a "bull's eye" or looks like a target.  ? Is red and painful, causes your skin to peel, and is not from being in the sun too long.  Summary   A rash is a change in the color of your skin. A rash can also change the way your skin feels.   The goal of treatment is to stop the itching and keep the rash from spreading.   Take or apply over-the-counter and prescription medicines only as told by your doctor.   Contact a doctor if you have new symptoms or symptoms that get worse.   Keep all follow-up visits as told by your doctor. This is  important.  This information is not intended to replace advice given to you by your health care provider. Make sure you discuss any questions you have with your health care provider.  Document Released: 11/29/2007 Document Revised: 01/14/2018 Document Reviewed: 01/14/2018  [employer] Interactive Patient Education  2019 [employer]

## 2018-07-15 NOTE — Progress Notes (Signed)
[facility] [place],  [id number]  Pulmonary Sleep Medicine   Office Visit Note  Patient Name: Candace Clayton DOB: Dec 18, 1960 MRN [id number]  Date of Service: 07/15/2018  Complaints/HPI: OSA on CPAP patient is on CPAP she is actually doing fairly well.  I did review her downloads which do look good.  She denies any specific complaints related to the CPAP.  Denies having any head pain sinus pain.  Denies having any cough no congestion at this time.  She still has some shortness of breath mainly with exertion no admissions to the hospital.  ROS  General: (-) fever, (-) chills, (-) night sweats, (-) weakness Skin: (-) rashes, (-) itching,. Eyes: (-) visual changes, (-) redness, (-) itching. Nose and Sinuses: (-) nasal stuffiness or itchiness, (-) postnasal drip, (-) nosebleeds, (-) sinus trouble. Mouth and Throat: (-) sore throat, (-) hoarseness. Neck: (-) swollen glands, (-) enlarged thyroid, (-) neck pain. Respiratory: - cough, (-) bloody sputum, + shortness of breath, - wheezing. Cardiovascular: - ankle swelling, (-) chest pain. Lymphatic: (-) lymph node enlargement. Neurologic: (-) numbness, (-) tingling. Psychiatric: (-) anxiety, (-) depression   Current Medication: Outpatient Encounter Medications as of 07/15/2018  Medication Sig  . ALPRAZolam (XANAX) 0.5 MG tablet Take 1 tablet (0.5 mg total) by mouth 3 (three) times daily as needed for anxiety.  . cetirizine (ZYRTEC) 10 MG tablet TAKE 1 TABLET(S) BY MOUTH DAILY FOR ALLERGIES (Patient taking differently: Take 10 mg by mouth daily. )  . desvenlafaxine (PRISTIQ) 100 MG 24 hr tablet Take 1 tablet (100 mg total) by mouth daily.  . diclofenac (VOLTAREN) 75 MG EC tablet Take 1 tablet (75 mg total) by mouth 2 (two) times daily for 21 days.  . enalapril (VASOTEC) 20 MG tablet Take 1 tablet (20 mg total) by mouth 2 (two) times daily. (Patient taking differently: Take 40 mg by mouth daily. )  .  hydrochlorothiazide (HYDRODIURIL) 25 MG tablet Take 1 tablet (25 mg total) by mouth daily.  . Multiple Vitamins-Calcium (ONE-A-DAY WOMENS PO) Take 1 tablet by mouth daily.  Marland Kitchen OVER THE COUNTER MEDICATION 1 tablet daily as needed. Takes generic stool softener for constipation d/t oxycodone.  . Pseudoeph-Doxylamine-DM-APAP (NYQUIL PO) Take 2 capsules by mouth at bedtime as needed (for sleep).  . gabapentin (NEURONTIN) 300 MG capsule 300 mg qhs x 1 week then 600 mg qhs x 1 week, then 300 mg qAM, and 600 mg qhs (Patient not taking: Reported on 07/15/2018)   No facility-administered encounter medications on file as of 07/15/2018.     Surgical History: Past Surgical History:  Procedure Laterality Date  . CESAREAN SECTION  1992  . COLONOSCOPY     polyps removed first procedure. 2nd time all was clear  . FINGER SURGERY Left 2011   left finger cut off x 2.(only up to last digit)  . HEMATOMA EVACUATION Left 1999   upper part of foot was injured d/t 500lb weight landing on her foot.   Marland Kitchen LAPAROSCOPIC SALPINGO OOPHERECTOMY Left 04/02/2018   Procedure: LAPAROSCOPIC SALPINGO OOPHORECTOMY;  Surgeon: Malachy Mood, MD;  Location: [facility] ORS;  Service: Gynecology;  Laterality: Left;    Medical History: Past Medical History:  Diagnosis Date  . Anginal pain (HCC)    tightness related to anxiety  . Anxiety   . Depression   . GERD (gastroesophageal reflux disease)    throws up easily but not diagnosed with reflux  . History of C-section 59  . Hypertension   .  Sleep apnea    uses cpap    Family History: Family History  Problem Relation Age of Onset  . Breast cancer Mother 40       x 3 times  . Colon cancer Mother   . Brain cancer Mother   . Cancer - Colon Mother   . Cancer - Other Mother   . Heart Problems Father   . Colon cancer Brother   . Heart Problems Brother     Social History: Social History   Socioeconomic History  . Marital status: Divorced    Spouse name: Not on file  .  Number of children: 1  . Years of education: Not on file  . Highest education level: Not on file  Occupational History  . Occupation: works in Secretary/administrator  Social Needs  . Financial resource strain: Not on file  . Food insecurity:    Worry: Not on file    Inability: Not on file  . Transportation needs:    Medical: Not on file    Non-medical: Not on file  Tobacco Use  . Smoking status: Never Smoker  . Smokeless tobacco: Never Used  Substance and Sexual Activity  . Alcohol use: No    Frequency: Never  . Drug use: No  . Sexual activity: Not Currently    Birth control/protection: Post-menopausal  Lifestyle  . Physical activity:    Days per week: Not on file    Minutes per session: Not on file  . Stress: Not on file  Relationships  . Social connections:    Talks on phone: Not on file    Gets together: Not on file    Attends religious service: Not on file    Active member of club or organization: Not on file    Attends meetings of clubs or organizations: Not on file    Relationship status: Not on file  . Intimate partner violence:    Fear of current or ex partner: Not on file    Emotionally abused: Not on file    Physically abused: Not on file    Forced sexual activity: Not on file  Other Topics Concern  . Not on file  Social History Narrative   Son has schizophrenia and autism but is fully capable of helping mother after surgery    Vital Signs: Blood pressure (!) 150/87, pulse 88, resp. rate 16, height 5\' 9"  (1.753 m), weight (!) 346 lb (156.9 kg), SpO2 99 %.  Examination: General Appearance: The patient is well-developed, well-nourished, and in no distress. Skin: Gross inspection of skin unremarkable. Head: normocephalic, no gross deformities. Eyes: no gross deformities noted. ENT: ears appear grossly normal no exudates. Neck: Supple. No thyromegaly. No LAD. Respiratory: no rhonchi noted at this time. Cardiovascular: Normal S1 and S2 without murmur or  rub. Extremities: No cyanosis. pulses are equal. Neurologic: Alert and oriented. No involuntary movements.  LABS: Recent Results (from the past 2160 hour(s))  Cytology - PAP     Status: None   Collection Time: 06/03/18 12:00 AM  Result Value Ref Range   Adequacy      Satisfactory for evaluation  endocervical/transformation zone component PRESENT.   Diagnosis      NEGATIVE FOR INTRAEPITHELIAL LESIONS OR MALIGNANCY.   HPV NOT DETECTED     Comment: Normal Reference Range - NOT Detected   Material Submitted CervicoVaginal Pap [ThinPrep Imaged]   Compliance Drug Analysis, Ur     Status: None   Collection Time: 07/02/18  1:05 PM  Result Value Ref Range   Summary FINAL     Comment: ==================================================================== [facility] COMP DRUG ANALYSIS,UR ==================================================================== Test                             Result       Flag       Units Drug Present and Declared for Prescription Verification   Alprazolam                     86           EXPECTED   ng/mg creat   Alpha-hydroxyalprazolam        72           EXPECTED   ng/mg creat    Source of alprazolam is a scheduled prescription medication.    Alpha-hydroxyalprazolam is an expected metabolite of alprazolam.   Desmethylvenlafaxine           PRESENT      EXPECTED    Desmethylvenlafaxine may be present due to administration of    desvenlafaxine; it is also an expected metabolite of venlafaxine. Drug Present not Declared for Prescription Verification   Salicylate                     PRESENT      UNEXPECTED   Naproxen                       PRESENT      UNEXPECTED Drug Absent but Declared for Prescription Verification   Ephedrine/ Pseudoephedrine      Not Detected UNEXPECTED   Gabapentin                     Not Detected UNEXPECTED   Acetaminophen                  Not Detected UNEXPECTED    Acetaminophen, as indicated in the declared medication list, is    not always  detected even when used as directed.   Diclofenac                     Not Detected UNEXPECTED    Diclofenac, as indicated in the declared medication list, is not    always detected even when used as directed.   Doxylamine                     Not Detected UNEXPECTED   Dextromethorphan               Not Detected UNEXPECTED ==================================================================== Test                      Result    Flag   Units      Ref Range   Creatinine              71               mg/dL      >=20 ==================================================================== Declared Medications:  The flagging and interpretation on this report are based on the  following declared medications.  Unexpected results may arise from  ina ccuracies in the declared medications.  **Note: The testing scope of this panel includes these medications:  Alprazolam  Desvenlafaxine  Dextromethorphan  Doxylamine  Gabapentin  Pseudoephedrine  **Note: The testing scope of this panel does not include small to  moderate amounts of these reported medications:  Acetaminophen  Diclofenac  **Note: The testing scope of this panel does not include following  reported medications:  Acyclovir  Calcium  Cetirizine  Docusate (Stool Softener)  Enalapril  Hydrochlorothiazide  Multivitamin ==================================================================== For clinical consultation, please call [phone]. ====================================================================     Radiology: US Abdomen Limited Ruq  Result Date: 06/12/2018 CLINICAL DATA:  Right upper quadrant pain with nausea and vomiting EXAM: ULTRASOUND ABDOMEN LIMITED RIGHT UPPER QUADRANT COMPARISON:  CT abdomen and pelvis April 03, 2018 FINDINGS: Gallbladder: Within the gallbladder, there are echogenic foci which move and shadow consistent with cholelithiasis. Largest gallstone measures 2.3 cm in length. There is no gallbladder  wall thickening or pericholecystic fluid. No sonographic Murphy sign noted by sonographer. Common bile duct: Diameter: 4 mm. No intrahepatic or extrahepatic biliary duct dilatation. Liver: No focal lesion identified. Liver echogenicity is increased diffusely. An area of relative decreased echogenicity adjacent to the gallbladder likely represents fatty infiltration. Portal vein is patent on color Doppler imaging with normal direction of blood flow towards the liver. IMPRESSION: 1. Cholelithiasis. No gallbladder wall thickening or pericholecystic fluid. 2. Diffuse increase in liver echogenicity consistent with hepatic steatosis. Focal fatty sparing noted near the gallbladder fossa. No focal liver lesions identified beyond the fatty sparing. It should be cautioned that the sensitivity of ultrasound for detection of focal liver lesions is diminished in this circumstance. Electronically Signed   By: Lowella Grip III M.D.   On: 06/12/2018 13:15    No results found.  No results found.    Assessment and Plan: Patient Active Problem List   Diagnosis Date Noted  . Left ovarian cyst 03/03/2018  . Other intervertebral disc degeneration, lumbar region 12/23/2017  . Screening for breast cancer 10/24/2017  . Lumbago with sciatica, unspecified side 07/03/2017  . Allergic rhinitis, unspecified 07/03/2017  . Pain in unspecified knee 07/03/2017  . Obstructive sleep apnea, adult 07/03/2017  . Sedative, hypnotic, or anxiolytic use, unspecified, uncomplicated 56/31/4970  . Hypersomnia 06/27/2017  . Major depressive disorder, recurrent episode, moderate ([place]) 06/27/2017  . Essential hypertension 06/27/2017  . Morbid obesity ([place]) 06/27/2017  . GAD (generalized anxiety disorder) 06/27/2017  . Urinary tract infection 06/27/2017    1. OSA on CPAP continue with the CPAP at the current pressures.  Encouraged ongoing compliance.  Mask fit is okay at this time we will continue to monitor. 2. Mobid obesity she  needs to work on her weight loss.  Through diet and exercise mainly spoke with her about dietary restrictions as well as becoming more active 3. Allergic Rhinitis this is under control she takes medicines as necessary 4. Rash ?meds related itching will give atarax 5. Clear but she states that it started after a new medication that was started by her primary 6. SOB ?asthma will check PFT she has not had a follow-up pulmonary function this year so we will go ahead and get this scheduled.  We will continue with other therapy has above  General Counseling: I have discussed the findings of the evaluation and examination with Candace Clayton.  I have also discussed any further diagnostic evaluation thatmay be needed or ordered today. Candace Clayton verbalizes understanding of the findings of todays visit. We also reviewed her medications today and discussed drug interactions and side effects including but not limited excessive drowsiness and altered mental states. We also discussed that there is always a risk not just to her but also people around her. she has been encouraged to call the  office with any questions or concerns that should arise related to todays visit.    Time spent: 15 minutes  I have personally obtained a history, examined the patient, evaluated laboratory and imaging results, formulated the assessment and plan and placed orders.    Allyne Gee, MD [facility] Pulmonary and Critical Care Sleep medicine

## 2018-07-17 ENCOUNTER — Telehealth: Payer: Self-pay | Admitting: *Deleted

## 2018-07-17 ENCOUNTER — Ambulatory Visit
Admission: RE | Admit: 2018-07-17 | Discharge: 2018-07-17 | Disposition: A | Discharge status: Home / Self Care | Payer: BLUE CROSS/BLUE SHIELD | Source: Ambulatory Visit | Attending: Student in an Organized Health Care Education/Training Program | Admitting: Student in an Organized Health Care Education/Training Program

## 2018-07-17 ENCOUNTER — Telehealth: Payer: Self-pay | Admitting: Student in an Organized Health Care Education/Training Program

## 2018-07-17 ENCOUNTER — Ambulatory Visit (HOSPITAL_BASED_OUTPATIENT_CLINIC_OR_DEPARTMENT_OTHER): Payer: BLUE CROSS/BLUE SHIELD | Admitting: Student in an Organized Health Care Education/Training Program

## 2018-07-17 ENCOUNTER — Other Ambulatory Visit: Payer: Self-pay

## 2018-07-17 ENCOUNTER — Encounter: Payer: Self-pay | Admitting: Student in an Organized Health Care Education/Training Program

## 2018-07-17 DIAGNOSIS — M47816 Spondylosis without myelopathy or radiculopathy, lumbar region: Secondary | ICD-10-CM | POA: Insufficient documentation

## 2018-07-17 MED ORDER — DEXAMETHASONE SODIUM PHOSPHATE 10 MG/ML IJ SOLN
INTRAMUSCULAR | Status: AC
  Filled 2018-07-17: qty 1

## 2018-07-17 MED ORDER — LIDOCAINE HCL 2 % IJ SOLN
20.0000 mL | Freq: Once | INTRAMUSCULAR | Status: AC
  Administered 2018-07-17: 400 mg

## 2018-07-17 MED ORDER — DULOXETINE HCL 30 MG PO CPEP: 30.0000 mg | ORAL_CAPSULE | Freq: Every day | ORAL | 2 refills | Status: DC

## 2018-07-17 MED ORDER — ROPIVACAINE HCL 2 MG/ML IJ SOLN
10.0000 mL | Freq: Once | INTRAMUSCULAR | Status: AC
  Administered 2018-07-17: 10 mL

## 2018-07-17 MED ORDER — LACTATED RINGERS IV SOLN
1000.0000 mL | Freq: Once | INTRAVENOUS | Status: AC
  Administered 2018-07-17 (×2): 1000 mL via INTRAVENOUS

## 2018-07-17 MED ORDER — LIDOCAINE HCL 2 % IJ SOLN
INTRAMUSCULAR | Status: AC
  Filled 2018-07-17: qty 20

## 2018-07-17 MED ORDER — ROPIVACAINE HCL 2 MG/ML IJ SOLN
INTRAMUSCULAR | Status: AC
  Filled 2018-07-17: qty 10

## 2018-07-17 MED ORDER — FENTANYL CITRATE (PF) 100 MCG/2ML IJ SOLN
INTRAMUSCULAR | Status: AC
  Filled 2018-07-17: qty 2

## 2018-07-17 MED ORDER — DEXAMETHASONE SODIUM PHOSPHATE 10 MG/ML IJ SOLN
10.0000 mg | Freq: Once | INTRAMUSCULAR | Status: AC
  Administered 2018-07-17: 10 mg

## 2018-07-17 MED ORDER — FENTANYL CITRATE (PF) 100 MCG/2ML IJ SOLN
25.0000 ug | INTRAMUSCULAR | Status: DC | PRN
  Administered 2018-07-17: 50 ug via INTRAVENOUS

## 2018-07-17 NOTE — Telephone Encounter (Signed)
She called back and said to let you know she got her medicine, the second time she went to the pharmacy.

## 2018-07-17 NOTE — Telephone Encounter (Signed)
Called patient . No answer. Left message to call us and let us know what medication was to be sent to her pharmacy. I did see that cymbalta was discontinued. Did yall talk about ordering a Medication today during her appt?

## 2018-07-17 NOTE — Progress Notes (Signed)
Safety precautions to be maintained throughout the outpatient stay will include: orient to surroundings, keep bed in low position, maintain call bell within reach at all times, provide assistance with transfer out of bed and ambulation.  

## 2018-07-17 NOTE — Patient Instructions (Addendum)
A prescription for Cymbalta has been sent to your pharmacy.  Stop Gabapentin. If your swelling in your right wrist is not better in 1 week, please all mePost-procedure Information What to expect: Most procedures involve the use of a local anesthetic (numbing medicine), and a steroid (anti-inflammatory medicine).  The local anesthetics may cause temporary numbness and weakness of the legs or arms, depending on the location of the block. This numbness/weakness may last 4-6 hours, depending on the local anesthetic used. In rare instances, it can last up to 24 hours. While numb, you must be very careful not to injure the extremity.  After any procedure, you could expect the pain to get better within 15-20 minutes. This relief is temporary and may last 4-6 hours. Once the local anesthetics wears off, you could experience discomfort, possibly more than usual, for up to 10 (ten) days. In the case of radiofrequencies, it may last up to 6 weeks. Surgeries may take up to 8 weeks for the healing process. The discomfort is due to the irritation caused by needles going through skin and muscle. To minimize the discomfort, we recommend using ice the first day, and heat from then on. The ice should be applied for 15 minutes on, and 15 minutes off. Keep repeating this cycle until bedtime. Avoid applying the ice directly to the skin, to prevent frostbite. Heat should be used daily, until the pain improves (4-10 days). Be careful not to burn yourself.  Occasionally you may experience muscle spasms or cramps. These occur as a consequence of the irritation caused by the needle sticks to the muscle and the blood that will inevitably be lost into the surrounding muscle tissue. Blood tends to be very irritating to tissues, which tend to react by going into spasm. These spasms may start the same day of your procedure, but they may also take days to develop. This late onset type of spasm or cramp is usually caused by electrolyte  imbalances triggered by the steroids, at the level of the kidney. Cramps and spasms tend to respond well to muscle relaxants, multivitamins (some are triggered by the procedure, but may have their origins in vitamin deficiencies), and "Gatorade", or any sports drinks that can replenish any electrolyte imbalances. (If you are a diabetic, ask your pharmacist to get you a sugar-free brand.) Warm showers or baths may also be helpful. Stretching exercises are highly recommended. General Instructions:  Be alert for signs of possible infection: redness, swelling, heat, red streaks, elevated temperature, and/or fever. These typically appear 4 to 6 days after the procedure. Immediately notify your doctor if you experience unusual bleeding, difficulty breathing, or loss of bowel or bladder control. If you experience increased pain, do not increase your pain medicine intake, unless instructed by your pain physician. Post-Procedure Care:  Be careful in moving about. Muscle spasms in the area of the injection may occur. Applying ice or heat to the area is often helpful. The incidence of spinal headaches after epidural injections ranges between 1.4% and 6%. If you develop a headache that does not seem to respond to conservative therapy, please let your physician know. This can be treated with an epidural blood patch.   Post-procedure numbness or redness is to be expected, however it should average 4 to 6 hours. If numbness and weakness of your extremities begins to develop 4 to 6 hours after your procedure, and is felt to be progressing and worsening, immediately contact your physician.   Diet:  If you experience nausea,  do not eat until this sensation goes away. If you had a "Stellate Ganglion Block" for upper extremity "Reflex Sympathetic Dystrophy", do not eat or drink until your hoarseness goes away. In any case, always start with liquids first and if you tolerate them well, then slowly progress to more solid  foods. Activity:  For the first 4 to 6 hours after the procedure, use caution in moving about as you may experience numbness and/or weakness. Use caution in cooking, using household electrical appliances, and climbing steps. If you need to reach your Doctor call our office: [phone]) [phone] Monday-Thursday 8:00 am - 4:00 PM    Fridays: Closed     In case of an emergency: In case of emergency, call 911 or go to the nearest emergency room and have the physician there call us.  Interpretation of Procedure Every nerve block has two components: a diagnostic component, and a treatment component. Unrealistic expectations are the most common causes of "perceived failure".  In a perfect world, a single nerve block should be able to completely and permanently eliminate the pain. Sadly, the world is not perfect.  Most pain management nerve blocks are performed using local anesthetics and steroids. Steroids are responsible for any long-term benefit that you may experience. Their purpose is to decrease any chronic swelling that may exist in the area. Steroids begin to work immediately after being injected. However, most patients will not experience any benefits until 5 to 10 days after the injection, when the swelling has come down to the point where they can tell a difference. Steroids will only help if there is swelling to be treated. As such, they can assist with the diagnosis. If effective, they suggest an inflammatory component to the pain, and if ineffective, they rule out inflammation as the main cause or component of the problem. If the problem is one of mechanical compression, you will get no benefit from those steroids.   In the case of local anesthetics, they have a crucial role in the diagnosis of your condition. Most will begin to work within15 to 20 minutes after injection. The duration will depend on the type used (short- vs. Long-acting). It is of outmost importance that patients keep tract of their  pain, after the procedure. To assist with this matter, a "Post-procedure Pain Diary" is provided. Make sure to complete it and to bring it back to your follow-up appointment.  As long as the patient keeps accurate, detailed records of their symptoms after every procedure, and returns to have those interpreted, every procedure will provide [place] with invaluable information. Even a block that does not provide the patient with any relief, will always provide [place] with information about the mechanism and the origin of the pain. The only time a nerve block can be considered a waste of time is when patients do not keep track of the results, or do not keep their post-procedure appointment.  Reporting the results back to your physician The Pain Score  Pain is a subjective complaint. It cannot be seen, touched, or measured. We depend entirely on the patient's report of the pain in order to assess your condition and treatment. To evaluate the pain, we use a pain scale, where "0" means "No Pain", and a "10" is "the worst possible pain that you can even imagine" (i.e. something like been eaten alive by a shark or being torn apart by a lion).   You will frequently be asked to rate your pain. Please be as accurate, remember  that medical decisions will be based on your responses. Please do not rate your pain above a 10. Doing so is actually interpreted as "symptom magnification" (exaggeration), as well as lack of understanding with regards to the scale. To put this into perspective, when you tell us that your pain is at a 10 (ten), what you are saying is that there is nothing we can do to make this pain any worse. (Carefully think about that.)

## 2018-07-17 NOTE — Telephone Encounter (Signed)
Attempted to call patient to instruct her not to take Cymbalta. Per Dr. Holley Raring, she may consider discontinuing the Pristique and taking Cymbalta.

## 2018-07-17 NOTE — Telephone Encounter (Signed)
Pt called and stated that she is at the pharmacy and they told the pt that they haven't received any rx's yet and wanted someone to call her back. I told pt that since she just left here not long ago that it could take an hour or so for everything to be sent.

## 2018-07-17 NOTE — Progress Notes (Signed)
Patient's Name: Candace Clayton  MRN: [id number]  Referring Provider: Gillis Santa, MD  DOB: Nov 19, 1960  PCP: Ronnell Freshwater, NP  DOS: 07/17/2018  Note by: Gillis Santa, MD  Service setting: Ambulatory outpatient  Specialty: Interventional Pain Management  Patient type: Established  Location: [facility] (AMB) Pain Management Facility  Visit type: Interventional Procedure   Primary Reason for Visit: Interventional Pain Management Treatment. CC: Back Pain (low) and Hand Pain (right, slightly swollen at wrist area- no trauma per patient)  Procedure:          Anesthesia, Analgesia, Anxiolysis:  Type: Lumbar Facet, Medial Branch Block(s) #1  Primary Purpose: Diagnostic Region: Posterolateral Lumbosacral Spine Level:  L3, L4, L5, & S1 Medial Branch Level(s). Injecting these levels blocks the L3-4, L4-5, and L5-S1 lumbar facet joints. Laterality: Right  Type: Moderate (Conscious) Sedation combined with Local Anesthesia Indication(s): Analgesia and Anxiety Route: Intravenous (IV) IV Access: Secured Sedation: Meaningful verbal contact was maintained at all times during the procedure  Local Anesthetic: Lidocaine 1-2%  Position: Prone   Indications: 1. Lumbar facet arthropathy (R>L)    Pain Score: Pre-procedure: 8 /10 Post-procedure: 0-No pain/10  Pre-op Assessment:  Candace Clayton is a 58 y.o. (year old), female patient, seen today for interventional treatment. She  has a past surgical history that includes Cesarean section (1992); Finger surgery (Left, 2011); Hematoma evacuation (Left, 1999); Colonoscopy; and Laparoscopic salpingo oophorectomy (Left, 04/02/2018). Candace Clayton has a current medication list which includes the following prescription(s): alprazolam, cetirizine, desvenlafaxine, diclofenac, enalapril, hydrochlorothiazide, hydroxyzine, multiple vitamins-calcium, OVER THE COUNTER MEDICATION, pseudoeph-doxylamine-dm-apap, and gabapentin, and the following Facility-Administered Medications:  fentanyl. Her primarily concern today is the Back Pain (low) and Hand Pain (right, slightly swollen at wrist area- no trauma per patient)  Initial Vital Signs:  Pulse/HCG Rate: 69ECG Heart Rate: 74 Temp: 98.1 F (36.7 C) Resp: 18 BP: (!) 157/83 SpO2: 100 %  BMI: Estimated body mass index is 51.24 kg/m as calculated from the following:   Height as of this encounter: 5\' 9"  (1.753 m).   Weight as of this encounter: 347 lb (157.4 kg).  Risk Assessment: Allergies: Reviewed. She is allergic to gabapentin.  Allergy Precautions: None required Coagulopathies: Reviewed. None identified.  Blood-thinner therapy: None at this time Active Infection(s): Reviewed. None identified. Candace Clayton is afebrile  Site Confirmation: Candace Clayton was asked to confirm the procedure and laterality before marking the site Procedure checklist: Completed Consent: Before the procedure and under the influence of no sedative(s), amnesic(s), or anxiolytics, the patient was informed of the treatment options, risks and possible complications. To fulfill our ethical and legal obligations, as recommended by the American Medical Association's Code of Ethics, I have informed the patient of my clinical impression; the nature and purpose of the treatment or procedure; the risks, benefits, and possible complications of the intervention; the alternatives, including doing nothing; the risk(s) and benefit(s) of the alternative treatment(s) or procedure(s); and the risk(s) and benefit(s) of doing nothing. The patient was provided information about the general risks and possible complications associated with the procedure. These may include, but are not limited to: failure to achieve desired goals, infection, bleeding, organ or nerve damage, allergic reactions, paralysis, and death. In addition, the patient was informed of those risks and complications associated to Spine-related procedures, such as failure to decrease pain; infection  (i.e.: Meningitis, epidural or intraspinal abscess); bleeding (i.e.: epidural hematoma, subarachnoid hemorrhage, or any other type of intraspinal or peri-dural bleeding); organ or nerve damage (i.e.: Any type of  peripheral nerve, nerve root, or spinal cord injury) with subsequent damage to sensory, motor, and/or autonomic systems, resulting in permanent pain, numbness, and/or weakness of one or several areas of the body; allergic reactions; (i.e.: anaphylactic reaction); and/or death. Furthermore, the patient was informed of those risks and complications associated with the medications. These include, but are not limited to: allergic reactions (i.e.: anaphylactic or anaphylactoid reaction(s)); adrenal axis suppression; blood sugar elevation that in diabetics may result in ketoacidosis or comma; water retention that in patients with history of congestive heart failure may result in shortness of breath, pulmonary edema, and decompensation with resultant heart failure; weight gain; swelling or edema; medication-induced neural toxicity; particulate matter embolism and blood vessel occlusion with resultant organ, and/or nervous system infarction; and/or aseptic necrosis of one or more joints. Finally, the patient was informed that Medicine is not an exact science; therefore, there is also the possibility of unforeseen or unpredictable risks and/or possible complications that may result in a catastrophic outcome. The patient indicated having understood very clearly. We have given the patient no guarantees and we have made no promises. Enough time was given to the patient to ask questions, all of which were answered to the patient's satisfaction. Candace Clayton has indicated that she wanted to continue with the procedure. Attestation: I, the ordering provider, attest that I have discussed with the patient the benefits, risks, side-effects, alternatives, likelihood of achieving goals, and potential problems during recovery  for the procedure that I have provided informed consent. Date  Time: 07/17/2018  9:25 AM  Pre-Procedure Preparation:  Monitoring: As per clinic protocol. Respiration, ETCO2, SpO2, BP, heart rate and rhythm monitor placed and checked for adequate function Safety Precautions: Patient was assessed for positional comfort and pressure points before starting the procedure. Time-out: I initiated and conducted the "Time-out" before starting the procedure, as per protocol. The patient was asked to participate by confirming the accuracy of the "Time Out" information. Verification of the correct person, site, and procedure were performed and confirmed by me, the nursing staff, and the patient. "Time-out" conducted as per Joint Commission's Universal Protocol (UP.01.01.01). Time: 1037  Description of Procedure:          Laterality: Right Levels:  L3, L4, L5, & S1 Medial Branch Level(s) Area Prepped: Posterior Lumbosacral Region Prepping solution: ChloraPrep (2% chlorhexidine gluconate and 70% isopropyl alcohol) Safety Precautions: Aspiration looking for blood return was conducted prior to all injections. At no point did we inject any substances, as a needle was being advanced. Before injecting, the patient was told to immediately notify me if she was experiencing any new onset of "ringing in the ears, or metallic taste in the mouth". No attempts were made at seeking any paresthesias. Safe injection practices and needle disposal techniques used. Medications properly checked for expiration dates. SDV (single dose vial) medications used. After the completion of the procedure, all disposable equipment used was discarded in the proper designated medical waste containers. Local Anesthesia: Protocol guidelines were followed. The patient was positioned over the fluoroscopy table. The area was prepped in the usual manner. The time-out was completed. The target area was identified using fluoroscopy. A 12-in long, straight,  sterile hemostat was used with fluoroscopic guidance to locate the targets for each level blocked. Once located, the skin was marked with an approved surgical skin marker. Once all sites were marked, the skin (epidermis, dermis, and hypodermis), as well as deeper tissues (fat, connective tissue and muscle) were infiltrated with a small amount of a  short-acting local anesthetic, loaded on a 10cc syringe with a 25G, 1.5-in  Needle. An appropriate amount of time was allowed for local anesthetics to take effect before proceeding to the next step. Local Anesthetic: Lidocaine 2.0% The unused portion of the local anesthetic was discarded in the proper designated containers. Technical explanation of process:  L3 Medial Branch Nerve Block (MBB): The target area for the L3 medial branch is at the junction of the postero-lateral aspect of the superior articular process and the superior, posterior, and medial edge of the transverse process of L4. Under fluoroscopic guidance, a Quincke needle was inserted until contact was made with os over the superior postero-lateral aspect of the pedicular shadow (target area). After negative aspiration for blood, 2 mL of the nerve block solution was injected without difficulty or complication. The needle was removed intact. L4 Medial Branch Nerve Block (MBB): The target area for the L4 medial branch is at the junction of the postero-lateral aspect of the superior articular process and the superior, posterior, and medial edge of the transverse process of L5. Under fluoroscopic guidance, a Quincke needle was inserted until contact was made with os over the superior postero-lateral aspect of the pedicular shadow (target area). After negative aspiration for blood, 2 mL of the nerve block solution was injected without difficulty or complication. The needle was removed intact. L5 Medial Branch Nerve Block (MBB): The target area for the L5 medial branch is at the junction of the  postero-lateral aspect of the superior articular process and the superior, posterior, and medial edge of the sacral ala. Under fluoroscopic guidance, a Quincke needle was inserted until contact was made with os over the superior postero-lateral aspect of the pedicular shadow (target area). After negative aspiration for blood, 63mL of the nerve block solution was injected without difficulty or complication. The needle was removed intact. S1 Medial Branch Nerve Block (MBB): The target area for the S1 medial branch is at the posterior and inferior 6 o'clock position of the L5-S1 facet joint. Under fluoroscopic guidance, the Quincke needle inserted for the L5 MBB was redirected until contact was made with os over the inferior and postero aspect of the sacrum, at the 6 o' clock position under the L5-S1 facet joint (Target area). After negative aspiration for blood, 33mL of the nerve block solution was injected without difficulty or complication. The needle was removed intact. Procedural Needles: 22-gauge, 3.5-inch, Quincke needles used for all levels. Nerve block solution: 10 cc solution made of 9 cc of 0.2% ropivacaine, 1 cc of Decadron 10 mg/cc.  2 cc injected at each level above on the right.  The unused portion of the solution was discarded in the proper designated containers.  Once the entire procedure was completed, the treated area was cleaned, making sure to leave some of the prepping solution back to take advantage of its long term bactericidal properties.   Illustration of the posterior view of the lumbar spine and the posterior neural structures. Laminae of L2 through S1 are labeled. DPRL5, dorsal primary ramus of L5; DPRS1, dorsal primary ramus of S1; DPR3, dorsal primary ramus of L3; FJ, facet (zygapophyseal) joint L3-L4; I, inferior articular process of L4; LB1, lateral branch of dorsal primary ramus of L1; IAB, inferior articular branches from L3 medial branch (supplies L4-L5 facet joint); IBP,  intermediate branch plexus; MB3, medial branch of dorsal primary ramus of L3; NR3, third lumbar nerve root; S, superior articular process of L5; SAB, superior articular branches from L4 (supplies L4-5  facet joint also); TP3, transverse process of L3.  Vitals:   07/17/18 1057 07/17/18 1108 07/17/18 1117 07/17/18 1127  BP: (!) 163/90 (!) 141/83 (!) 141/71 139/67  Pulse:      Resp: 20 18 18 18   Temp:  98 F (36.7 C)    TempSrc:      SpO2: 99% 100% 100% 100%  Weight:      Height:         Start Time: 1037 hrs. End Time: 1057 hrs.  Imaging Guidance (Spinal):          Type of Imaging Technique: Fluoroscopy Guidance (Spinal) Indication(s): Assistance in needle guidance and placement for procedures requiring needle placement in or near specific anatomical locations not easily accessible without such assistance. Exposure Time: Please see nurses notes. Contrast: None used. Fluoroscopic Guidance: I was personally present during the use of fluoroscopy. "Tunnel Vision Technique" used to obtain the best possible view of the target area. Parallax error corrected before commencing the procedure. "Direction-depth-direction" technique used to introduce the needle under continuous pulsed fluoroscopy. Once target was reached, antero-posterior, oblique, and lateral fluoroscopic projection used confirm needle placement in all planes. Images permanently stored in EMR. Interpretation: No contrast injected. I personally interpreted the imaging intraoperatively. Adequate needle placement confirmed in multiple planes. Permanent images saved into the patient's record.  Antibiotic Prophylaxis:   Anti-infectives (From admission, onward)   None     Indication(s): None identified  Post-operative Assessment:  Post-procedure Vital Signs:  Pulse/HCG Rate: 6967 Temp: 98 F (36.7 C) Resp: 18 BP: 139/67 SpO2: 100 %  EBL: None  Complications: No immediate post-treatment complications observed by team, or  reported by patient.  Note: The patient tolerated the entire procedure well. A repeat set of vitals were taken after the procedure and the patient was kept under observation following institutional policy, for this type of procedure. Post-procedural neurological assessment was performed, showing return to baseline, prior to discharge. The patient was provided with post-procedure discharge instructions, including a section on how to identify potential problems. Should any problems arise concerning this procedure, the patient was given instructions to immediately contact us, at any time, without hesitation. In any case, we plan to contact the patient by telephone for a follow-up status report regarding this interventional procedure.  Comments:  No additional relevant information.  Plan of Care   Imaging Orders     DG C-Arm 1-60 Min-No Report Procedure Orders    No procedure(s) ordered today   Patient also has swelling of her right hand and right wrist.  She believes this started after gabapentin intake.  She also developed a rash.  She has discontinued gabapentin.  I have instructed the patient to remain off of gabapentin and continue to monitor her right wrist.  If her swelling and pain does not improve within the next week I instructed the patient to call our clinic so that this can be worked out.  Patient endorsed understanding.  Medications ordered for procedure: Meds ordered this encounter  Medications  . lactated ringers infusion 1,000 mL  . fentaNYL (SUBLIMAZE) injection 25-100 mcg    Make sure Narcan is available in the pyxis when using this medication. In the event of respiratory depression (RR< 8/min): Titrate NARCAN (naloxone) in increments of 0.1 to 0.2 mg IV at 2-3 minute intervals, until desired degree of reversal.  . ropivacaine (PF) 2 mg/mL (0.2%) (NAROPIN) injection 10 mL  . lidocaine (XYLOCAINE) 2 % (with pres) injection 400 mg  . dexamethasone (DECADRON)  injection 10 mg  .  DISCONTD: DULoxetine (CYMBALTA) 30 MG capsule    Sig: Take 1 capsule (30 mg total) by mouth daily.    Dispense:  30 capsule    Refill:  2   Medications administered: We administered lactated ringers, fentaNYL, ropivacaine (PF) 2 mg/mL (0.2%), lidocaine, and dexamethasone.  See the medical record for exact dosing, route, and time of administration.  Disposition: Discharge home  Discharge Date & Time: 07/17/2018;   hrs.   Physician-requested Follow-up: Return in about 4 weeks (around 08/14/2018) for Post Procedure Evaluation.  Future Appointments  Date Time Provider [place]  08/14/2018  1:30 PM Gillis Santa, MD [facility] None  08/16/2018  2:15 PM Ronnell Freshwater, NP [facility] None  08/21/2018  9:00 AM Allyne Gee, MD [facility] None  01/20/2019 11:00 AM Allyne Gee, MD [place] None   Primary Care Physician: Ronnell Freshwater, NP Location: [facility] Note by: Gillis Santa, MD Date: 07/17/2018; Time: 12:49 PM  Disclaimer:  Medicine is not an exact science. The only guarantee in medicine is that nothing is guaranteed. It is important to note that the decision to proceed with this intervention was based on the information collected from the patient. The Data and conclusions were drawn from the patient's questionnaire, the interview, and the physical examination. Because the information was provided in large part by the patient, it cannot be guaranteed that it has not been purposely or unconsciously manipulated. Every effort has been made to obtain as much relevant data as possible for this evaluation. It is important to note that the conclusions that lead to this procedure are derived in large part from the available data. Always take into account that the treatment will also be dependent on availability of resources and existing treatment guidelines, considered by other Pain Management Practitioners as being common knowledge and practice, at the time  of the intervention. For Medico-Legal purposes, it is also important to point out that variation in procedural techniques and pharmacological choices are the acceptable norm. The indications, contraindications, technique, and results of the above procedure should only be interpreted and judged by a Board-Certified Interventional Pain Specialist with extensive familiarity and expertise in the same exact procedure and technique.

## 2018-07-18 ENCOUNTER — Telehealth: Payer: Self-pay | Admitting: Student in an Organized Health Care Education/Training Program

## 2018-07-18 NOTE — Telephone Encounter (Signed)
Spoke with patient. She understands to [place] because she is already taking Prestiq.

## 2018-07-18 NOTE — Telephone Encounter (Signed)
Attempted to call patient to discuss Cymbalta that was prescribed yesterday. Per Dr. Holley Raring, she should not take Cymbalta because she is on [place].

## 2018-07-18 NOTE — Telephone Encounter (Signed)
Pt left a voicemail stating that she was talking with a nurse and her phone cut off. She states she would like a nurse to call her back.

## 2018-07-20 ENCOUNTER — Other Ambulatory Visit: Payer: Self-pay | Admitting: Student in an Organized Health Care Education/Training Program

## 2018-07-31 ENCOUNTER — Ambulatory Visit: Payer: Self-pay | Admitting: Internal Medicine

## 2018-08-14 ENCOUNTER — Ambulatory Visit: Payer: BLUE CROSS/BLUE SHIELD | Admitting: Student in an Organized Health Care Education/Training Program

## 2018-08-16 ENCOUNTER — Ambulatory Visit: Payer: Self-pay | Admitting: Nurse Practitioner

## 2018-08-21 ENCOUNTER — Ambulatory Visit: Payer: BC Managed Care – PPO | Admitting: Internal Medicine

## 2018-08-21 DIAGNOSIS — R0602 Shortness of breath: Secondary | ICD-10-CM | POA: Diagnosis not present

## 2018-08-21 LAB — PULMONARY FUNCTION TEST

## 2018-08-22 ENCOUNTER — Other Ambulatory Visit: Payer: Self-pay

## 2018-08-22 ENCOUNTER — Encounter: Payer: Self-pay | Admitting: Student in an Organized Health Care Education/Training Program

## 2018-08-22 ENCOUNTER — Ambulatory Visit
Payer: BLUE CROSS/BLUE SHIELD | Attending: Student in an Organized Health Care Education/Training Program | Admitting: Student in an Organized Health Care Education/Training Program

## 2018-08-22 VITALS — BP 158/83 | HR 72 | Temp 98.2°F | Resp 16 | Ht 69.0 in | Wt 347.0 lb

## 2018-08-22 DIAGNOSIS — G894 Chronic pain syndrome: Secondary | ICD-10-CM

## 2018-08-22 DIAGNOSIS — M5136 Other intervertebral disc degeneration, lumbar region: Secondary | ICD-10-CM | POA: Diagnosis not present

## 2018-08-22 DIAGNOSIS — M51369 Other intervertebral disc degeneration, lumbar region without mention of lumbar back pain or lower extremity pain: Secondary | ICD-10-CM

## 2018-08-22 DIAGNOSIS — M47816 Spondylosis without myelopathy or radiculopathy, lumbar region: Secondary | ICD-10-CM | POA: Diagnosis not present

## 2018-08-22 MED ORDER — CETIRIZINE HCL 10 MG PO TABS: ORAL_TABLET | ORAL | 4 refills | Status: DC

## 2018-08-22 NOTE — Progress Notes (Signed)
Patient's Name: Candace Clayton  MRN: [id number]  Referring Provider: Ronnell Freshwater, NP  DOB: 1960/07/08  PCP: Ronnell Freshwater, NP  DOS: 08/22/2018  Note by: Gillis Santa, MD  Service setting: Ambulatory outpatient  Specialty: Interventional Pain Management  Location: [facility] (AMB) Pain Management Facility    Patient type: Established   Primary Reason(s) for Visit: Encounter for post-procedure evaluation of chronic illness with mild to moderate exacerbation CC: Back Pain (left, lower)  HPI  Ms. Ransier is a 58 y.o. year old, female patient, who comes today for a post-procedure evaluation. She has Sedative, hypnotic, or anxiolytic use, unspecified, uncomplicated; Hypersomnia; Major depressive disorder, recurrent episode, moderate ([place]); Essential hypertension; Morbid obesity ([place]); GAD (generalized anxiety disorder); Urinary tract infection; Lumbago with sciatica, unspecified side; Allergic rhinitis, unspecified; Pain in unspecified knee; Obstructive sleep apnea, adult; Screening for breast cancer; Lumbar degenerative disc disease; Left ovarian cyst; Lumbar facet arthropathy (R>L); and Lumbar spondylosis on their problem list. Her primarily concern today is the Back Pain (left, lower)  Pain Assessment: Location: Left Back Radiating: left upper leg Onset: More than a month ago Duration: Chronic pain Quality: Burning Severity: 0-No pain/10 (subjective, self-reported pain score)  Note: Reported level is compatible with observation.                         When using our objective Pain Scale, levels between 6 and 10/10 are said to belong in an emergency room, as it progressively worsens from a 6/10, described as severely limiting, requiring emergency care not usually available at an outpatient pain management facility. At a 6/10 level, communication becomes difficult and requires great effort. Assistance to reach the emergency department may be required. Facial flushing and profuse sweating along with  potentially dangerous increases in heart rate and blood pressure will be evident. Effect on ADL:   Timing: Intermittent Modifying factors: bending over, BC powder, Aleve BP: (!) 158/83  HR: 72  Ms. Drinkard comes in today for post-procedure evaluation.  Further details on both, my assessment(s), as well as the proposed treatment plan, please see below.  Post-Procedure Assessment  07/20/2018 Procedure: Right L3, L4, L5, S1 facet medial branch nerve block #1 Pre-procedure pain score:  8/10 Post-procedure pain score: 0/10         Influential Factors: BMI: 51.24 kg/m Intra-procedural challenges: None observed.         Assessment challenges: None detected.              Reported side-effects: None.        Post-procedural adverse reactions or complications: None reported         Sedation: Please see nurses note. When no sedatives are used, the analgesic levels obtained are directly associated to the effectiveness of the local anesthetics. However, when sedation is provided, the level of analgesia obtained during the initial 1 hour following the intervention, is believed to be the result of a combination of factors. These factors may include, but are not limited to: 1. The effectiveness of the local anesthetics used. 2. The effects of the analgesic(s) and/or anxiolytic(s) used. 3. The degree of discomfort experienced by the patient at the time of the procedure. 4. The patients ability and reliability in recalling and recording the events. 5. The presence and influence of possible secondary gains and/or psychosocial factors. Reported result: Relief experienced during the 1st hour after the procedure: 20 % (Ultra-Short Term Relief)  Interpretative annotation: Clinically appropriate result. Analgesia during this period is likely to be Local Anesthetic and/or IV Sedative (Analgesic/Anxiolytic) related.          Effects of local anesthetic: The analgesic effects attained during this period  are directly associated to the localized infiltration of local anesthetics and therefore cary significant diagnostic value as to the etiological location, or anatomical origin, of the pain. Expected duration of relief is directly dependent on the pharmacodynamics of the local anesthetic used. Long-acting (4-6 hours) anesthetics used.  Reported result: Relief during the next 4 to 6 hour after the procedure: 20 % (Short-Term Relief)            Interpretative annotation: Clinically appropriate result. Analgesia during this period is likely to be Local Anesthetic-related.          Long-term benefit: Defined as the period of time past the expected duration of local anesthetics (1 hour for short-acting and 4-6 hours for long-acting). With the possible exception of prolonged sympathetic blockade from the local anesthetics, benefits during this period are typically attributed to, or associated with, other factors such as analgesic sensory neuropraxia, antiinflammatory effects, or beneficial biochemical changes provided by agents other than the local anesthetics.  Reported result: Extended relief following procedure: 60 % (Long-Term Relief)            Interpretative annotation: Clinically possible results. Good relief. No permanent benefit expected. Inflammation plays a part in the etiology to the pain.          Current benefits: Defined as reported results that persistent at this point in time.   Analgesia: >75 % Ms. Peixoto reports improvement of axial and extremity symptoms. Function: Ms. Wheeless reports improvement in function ROM: Ms. Walmer reports improvement in ROM Interpretative annotation: Ongoing benefit. No permanent benefit expected. Effective diagnostic intervention.          Interpretation: Results would suggest a successful diagnostic and therapeutic intervention.                  Plan:  Set up procedure as a PRN palliative treatment option for this patient.                Laboratory  Chemistry  Inflammation Markers (CRP: Acute Phase) (ESR: Chronic Phase) No results found for: CRP, ESRSEDRATE, LATICACIDVEN                       Rheumatology Markers No results found for: RF, ANA, LABURIC, URICUR, LYMEIGGIGMAB, LYMEABIGMQN, HLAB27                      Renal Function Markers Lab Results  Component Value Date   BUN 20 04/03/2018   CREATININE 0.85 04/03/2018   GFRAA >60 04/03/2018   GFRNONAA >60 04/03/2018                             Hepatic Function Markers Lab Results  Component Value Date   AST 24 04/03/2018   ALT 28 04/03/2018   ALBUMIN 4.3 04/03/2018   ALKPHOS 79 04/03/2018                        Electrolytes Lab Results  Component Value Date   NA 136 04/03/2018   K 4.4 04/03/2018   CL 101 04/03/2018   CALCIUM 9.7 04/03/2018  Neuropathy Markers Lab Results  Component Value Date   HGBA1C 5.6 12/30/2014                        CNS Tests No results found for: COLORCSF, APPEARCSF, RBCCOUNTCSF, WBCCSF, POLYSCSF, LYMPHSCSF, EOSCSF, PROTEINCSF, GLUCCSF, JCVIRUS, CSFOLI, IGGCSF                      Bone Pathology Markers Lab Results  Component Value Date   VD25OH 22.5 (L) 03/10/2016                         Coagulation Parameters Lab Results  Component Value Date   PLT 353 04/03/2018                        Cardiovascular Markers Lab Results  Component Value Date   HGB 12.6 04/03/2018   HCT 39.5 04/03/2018                         CA Markers Lab Results  Component Value Date   CEA 1.0 08/28/2016   CA125 8.1 08/28/2016                        Endocrine Markers Lab Results  Component Value Date   TSH 1.773 05/22/2017   FREET4 0.73 05/22/2017                        Note: Lab results reviewed.  Recent Diagnostic Imaging Results  DG C-Arm 1-60 Min-No Report Fluoroscopy was utilized by the requesting physician.  No radiographic  interpretation.   Complexity Note: Imaging results reviewed. Results shared with  Ms. Hrivnak, using [employer].                         Meds   Current Outpatient Medications:  .  ALPRAZolam (XANAX) 0.5 MG tablet, Take 1 tablet (0.5 mg total) by mouth 3 (three) times daily as needed for anxiety., Disp: 90 tablet, Rfl: 3 .  cetirizine (ZYRTEC) 10 MG tablet, TAKE 1 TABLET(S) BY MOUTH DAILY FOR ALLERGIES, Disp: 30 tablet, Rfl: 4 .  desvenlafaxine (PRISTIQ) 100 MG 24 hr tablet, Take 1 tablet (100 mg total) by mouth daily., Disp: 90 tablet, Rfl: 0 .  enalapril (VASOTEC) 20 MG tablet, Take 1 tablet (20 mg total) by mouth 2 (two) times daily. (Patient taking differently: Take 40 mg by mouth daily. ), Disp: 60 tablet, Rfl: 5 .  hydrochlorothiazide (HYDRODIURIL) 25 MG tablet, Take 1 tablet (25 mg total) by mouth daily., Disp: 30 tablet, Rfl: 5 .  hydrOXYzine (ATARAX/VISTARIL) 10 MG tablet, Take 1 tablet (10 mg total) by mouth 3 (three) times daily as needed., Disp: 30 tablet, Rfl: 0 .  Multiple Vitamins-Calcium (ONE-A-DAY WOMENS PO), Take 1 tablet by mouth daily., Disp: , Rfl:  .  OVER THE COUNTER MEDICATION, 1 tablet daily as needed. Takes generic stool softener for constipation d/t oxycodone., Disp: , Rfl:  .  Pseudoeph-Doxylamine-DM-APAP (NYQUIL PO), Take 2 capsules by mouth at bedtime as needed (for sleep)., Disp: , Rfl:   ROS  Constitutional: Denies any fever or chills Gastrointestinal: No reported hemesis, hematochezia, vomiting, or acute GI distress Musculoskeletal: Denies any acute onset joint swelling, redness, loss of ROM, or weakness Neurological: No reported episodes of acute onset apraxia,  aphasia, dysarthria, agnosia, amnesia, paralysis, loss of coordination, or loss of consciousness  Allergies  Ms. Pucci is allergic to gabapentin.  PFSH  Drug: Ms. Kroeger  reports no history of drug use. Alcohol:  reports no history of alcohol use. Tobacco:  reports that she has never smoked. She has never used smokeless tobacco. Medical:  has a past medical history of  Anginal pain ([place]), Anxiety, Depression, GERD (gastroesophageal reflux disease), History of C-section (1992), Hypertension, and Sleep apnea. Surgical: Ms. Lemaster  has a past surgical history that includes Cesarean section (1992); Finger surgery (Left, 2011); Hematoma evacuation (Left, 1999); Colonoscopy; and Laparoscopic salpingo oophorectomy (Left, 04/02/2018). Family: family history includes Brain cancer in her mother; Breast cancer (age of onset: 69) in her mother; Cancer - Colon in her mother; Cancer - Other in her mother; Colon cancer in her brother and mother; Heart Problems in her brother and father.  Constitutional Exam  General appearance: Well nourished, well developed, and well hydrated. In no apparent acute distress Vitals:   08/22/18 1335  BP: (!) 158/83  Pulse: 72  Resp: 16  Temp: 98.2 F (36.8 C)  TempSrc: Oral  SpO2: 100%  Weight: (!) 347 lb (157.4 kg)  Height: 5' 9"  (1.753 m)   BMI Assessment: Estimated body mass index is 51.24 kg/m as calculated from the following:   Height as of this encounter: 5' 9"  (1.753 m).   Weight as of this encounter: 347 lb (157.4 kg).  BMI interpretation table: BMI level Category Range association with higher incidence of chronic pain  <18 kg/m2 Underweight   18.5-24.9 kg/m2 Ideal body weight   25-29.9 kg/m2 Overweight Increased incidence by 20%  30-34.9 kg/m2 Obese (Class I) Increased incidence by 68%  35-39.9 kg/m2 Severe obesity (Class II) Increased incidence by 136%  >40 kg/m2 Extreme obesity (Class III) Increased incidence by 254%   Patient's current BMI Ideal Body weight  Body mass index is 51.24 kg/m. Ideal body weight: 66.2 kg (145 lb 15.1 oz) Adjusted ideal body weight: 102.7 kg (226 lb 5.9 oz)   BMI Readings from Last 4 Encounters:  08/22/18 51.24 kg/m  07/17/18 51.24 kg/m  07/15/18 51.10 kg/m  07/02/18 49.77 kg/m   Wt Readings from Last 4 Encounters:  08/22/18 (!) 347 lb (157.4 kg)  07/17/18 (!) 347 lb (157.4 kg)   07/15/18 (!) 346 lb (156.9 kg)  07/02/18 (!) 337 lb (152.9 kg)  Psych/Mental status: Alert, oriented x 3 (person, place, & time)       Eyes: PERLA Respiratory: No evidence of acute respiratory distress  Cervical Spine Area Exam  Skin & Axial Inspection: No masses, redness, edema, swelling, or associated skin lesions Alignment: Symmetrical Functional ROM: Unrestricted ROM      Stability: No instability detected Muscle Tone/Strength: Functionally intact. No obvious neuro-muscular anomalies detected. Sensory (Neurological): Unimpaired Palpation: No palpable anomalies              Upper Extremity (UE) Exam    Side: Right upper extremity  Side: Left upper extremity  Skin & Extremity Inspection: Skin color, temperature, and hair growth are WNL. No peripheral edema or cyanosis. No masses, redness, swelling, asymmetry, or associated skin lesions. No contractures.  Skin & Extremity Inspection: Skin color, temperature, and hair growth are WNL. No peripheral edema or cyanosis. No masses, redness, swelling, asymmetry, or associated skin lesions. No contractures.  Functional ROM: Unrestricted ROM          Functional ROM: Unrestricted ROM  Muscle Tone/Strength: Functionally intact. No obvious neuro-muscular anomalies detected.  Muscle Tone/Strength: Functionally intact. No obvious neuro-muscular anomalies detected.  Sensory (Neurological): Unimpaired          Sensory (Neurological): Unimpaired          Palpation: No palpable anomalies              Palpation: No palpable anomalies              Provocative Test(s):  Phalen's test: deferred Tinel's test: deferred Apley's scratch test (touch opposite shoulder):  Action 1 (Across chest): deferred Action 2 (Overhead): deferred Action 3 (LB reach): deferred   Provocative Test(s):  Phalen's test: deferred Tinel's test: deferred Apley's scratch test (touch opposite shoulder):  Action 1 (Across chest): deferred Action 2 (Overhead):  deferred Action 3 (LB reach): deferred    Thoracic Spine Area Exam  Skin & Axial Inspection: No masses, redness, or swelling Alignment: Symmetrical Functional ROM: Unrestricted ROM Stability: No instability detected Muscle Tone/Strength: Functionally intact. No obvious neuro-muscular anomalies detected. Sensory (Neurological): Unimpaired Muscle strength & Tone: No palpable anomalies  Lumbar Spine Area Exam  Skin & Axial Inspection: No masses, redness, or swelling Alignment: Symmetrical Functional ROM: Improved after treatment       Stability: No instability detected Muscle Tone/Strength: Functionally intact. No obvious neuro-muscular anomalies detected. Sensory (Neurological): Improved Palpation: No palpable anomalies       Provocative Tests: Hyperextension/rotation test: (+) bilaterally for facet joint pain.  Improved after treatment Lumbar quadrant test (Kemp's test): deferred today       Lateral bending test: (+) due to pain.,  Improved after treatment Patrick's Maneuver: deferred today                   FABER* test: deferred today                   S-I anterior distraction/compression test: deferred today         S-I lateral compression test: deferred today         S-I Thigh-thrust test: deferred today         S-I Gaenslen's test: deferred today         *(Flexion, ABduction and External Rotation)  Gait & Posture Assessment  Ambulation: Unassisted Gait: Relatively normal for age and body habitus Posture: WNL   Lower Extremity Exam    Side: Right lower extremity  Side: Left lower extremity  Stability: No instability observed          Stability: No instability observed          Skin & Extremity Inspection: Skin color, temperature, and hair growth are WNL. No peripheral edema or cyanosis. No masses, redness, swelling, asymmetry, or associated skin lesions. No contractures.  Skin & Extremity Inspection: Skin color, temperature, and hair growth are WNL. No peripheral edema or  cyanosis. No masses, redness, swelling, asymmetry, or associated skin lesions. No contractures.  Functional ROM: Unrestricted ROM                  Functional ROM: Unrestricted ROM                  Muscle Tone/Strength: Functionally intact. No obvious neuro-muscular anomalies detected.  Muscle Tone/Strength: Functionally intact. No obvious neuro-muscular anomalies detected.  Sensory (Neurological): Unimpaired        Sensory (Neurological): Unimpaired        DTR: Patellar: deferred today Achilles: deferred today Plantar: deferred today  DTR: Patellar: deferred today  Achilles: deferred today Plantar: deferred today  Palpation: No palpable anomalies  Palpation: No palpable anomalies   Assessment   Status Diagnosis  Controlled Controlled Controlled 1. Lumbar facet arthropathy (R>L)   2. Lumbar spondylosis   3. Lumbar degenerative disc disease   4. Morbid obesity ([place])   5. Chronic pain syndrome      Updated Problems: Problem  Lumbar facet arthropathy (R>L)  Lumbar Spondylosis  Lumbar Degenerative Disc Disease    58 year old female with history of morbid obesity, lumbar facet arthropathy, right greater than left, lumbar spondylosis who follows up status post right L3, L4, L5, S1 diagnostic lumbar facet medial branch nerve blocks #1.  Patient endorses significant pain relief after her right lumbar facet medial branch nerve blocks.  She endorses improvement in range of motion and states that she is able to tolerate standing at work with less pain.  She endorses an area of numbness along her right lateral thigh.  No pain in this area.  In regards to treatment plan, we discussed repeating diagnostic lumbar facet medial branch nerve blocks if and when patient has return of axial low back and buttock pain similar in nature to pre-block levels.  We also briefly discussed lumbar radiofrequency ablation of these medial branch nerves.  Patient will call and let us know when she wants to have  lumbar facet medial branch nerve blocks repeated.  Regards to medication management, I informed the patient that I would like to avoid opioid medications for her condition.  Patient is currently on Xanax and I informed her that she will not be a candidate for chronic opioid therapy so long as she is on this medication given the increased risk of respiratory depression with concomitant benzodiazepine and opioid medications especially the context of morbid obesity, obstructive sleep apnea.  Patient endorsed understanding.   Plan: -Repeat lumbar facet medial branch nerve blocks at L3, L4, L5, S1 PRN -Consider lumbar radiofrequency ablation in the future.  Lab-work, procedure(s), and/or referral(s): Orders Placed This Encounter  Procedures  . LUMBAR FACET(MEDIAL BRANCH NERVE BLOCK) MBNB   Provider-requested follow-up: Return if symptoms worsen or fail to improve.   Time Note: Greater than 50% of the 25 minute(s) of face-to-face time spent with Ms. Beaird, was spent in counseling/coordination of care regarding: Ms. Berman primary cause of pain, the treatment plan, treatment alternatives, the risks and possible complications of proposed treatment, going over the informed consent, realistic expectations, the goals of pain management (increased in functionality) and the need to bring and keep the BMI below 30.  Future Appointments  Date Time Provider [place]  08/26/2018  9:45 AM Ronnell Freshwater, NP [facility] None  01/20/2019 11:00 AM Allyne Gee, MD [place] None    Primary Care Physician: Ronnell Freshwater, NP Location: [facility] Outpatient Pain Management Facility Note by: Gillis Santa, M.D Date: 08/22/2018; Time: 2:20 PM  There are no Patient Instructions on file for this visit.

## 2018-08-22 NOTE — Progress Notes (Signed)
Safety precautions to be maintained throughout the outpatient stay will include: orient to surroundings, keep bed in low position, maintain call bell within reach at all times, provide assistance with transfer out of bed and ambulation.  

## 2018-08-23 NOTE — Procedures (Signed)
[place], [id number]  DATE OF SERVICE: August 21, 2018  Complete Pulmonary Function Testing Interpretation:  FINDINGS:  The forced vital capacity is mildly decreased.  The FEV1 is 22 7 L which is 76% predicted and is mildly decreased.  FEV1 FVC ratio is normal.  Postbronchodilator there is no significant change in the FEV1.  Total lung capacity is moderately decreased.  Residual volume is severely decreased.  Residual [place] thoracic ratio is decreased.  Total gas volume is severely decreased.  DLCO is mildly decreased.  IMPRESSION:  This pulmonary function study is consistent with mild obstructive lung disease and moderate restrictive lung disease.  Clinical correlation is recommended the DLCO was mildly decreased.  Allyne Gee, MD [facility] Pulmonary Critical Care Medicine Sleep Medicine

## 2018-08-26 ENCOUNTER — Ambulatory Visit: Payer: BC Managed Care – PPO | Admitting: Nurse Practitioner

## 2018-08-26 ENCOUNTER — Encounter: Payer: Self-pay | Admitting: Nurse Practitioner

## 2018-08-26 VITALS — BP 144/80 | HR 67 | Resp 16 | Ht 69.0 in | Wt 342.0 lb

## 2018-08-26 DIAGNOSIS — F411 Generalized anxiety disorder: Secondary | ICD-10-CM | POA: Diagnosis not present

## 2018-08-26 DIAGNOSIS — I10 Essential (primary) hypertension: Secondary | ICD-10-CM

## 2018-08-26 DIAGNOSIS — M5440 Lumbago with sciatica, unspecified side: Secondary | ICD-10-CM

## 2018-08-26 DIAGNOSIS — J301 Allergic rhinitis due to pollen: Secondary | ICD-10-CM | POA: Diagnosis not present

## 2018-08-26 MED ORDER — OXYCODONE-ACETAMINOPHEN 5-325 MG PO TABS: 1.0000 | ORAL_TABLET | ORAL | 0 refills | Status: DC | PRN

## 2018-08-26 MED ORDER — ALPRAZOLAM 0.5 MG PO TABS: 0.5000 mg | ORAL_TABLET | Freq: Two times a day (BID) | ORAL | 3 refills | Status: DC | PRN

## 2018-08-26 NOTE — Progress Notes (Signed)
[facility] [place], [place] [address]  Internal MEDICINE  Office Visit Note  Patient Name: Candace Clayton  [id number]  [id number]  Date of Service: 08/26/2018  Chief Complaint  Patient presents with  . Medical Management of Chronic Issues    3 month follow up  . Hypertension  . Anxiety    She she continues to have moderate to sever lower back pain. Recent MRI does show multi-level degenerative disc disease and multiple bulging discs. The pain in her lower back is described as a burning type pain and can be as bad as 8/10 in severity. The pain will get worse after standing for long periods of time as well as bending at the waist and lifting heavy objects. She will normally take oxycodone/APAP as needed to reduce the pain. Will bring the pain down to 4/10 in severity. Has seen pain management provider and has had facet injection to help the pain.As of right now, pain management provider is unwilling to provide narcotic pain medication as she currently does take alprazolam 0.5mg  as needed to help severe anxiety and panic attacks. She has weaned this down to one tablet daily when needed. Is trying to wean herself off completely  Hypertension  This is a chronic problem. The problem is unchanged. The problem is controlled. Associated symptoms include anxiety, chest pain and headaches. Pertinent negatives include no palpitations or shortness of breath. Agents associated with hypertension include NSAIDs and steroids. Risk factors for coronary artery disease include obesity, post-menopausal state and stress. Past treatments include ACE inhibitors and diuretics. The current treatment provides moderate improvement. Compliance problems include exercise.   Anxiety  Presents for follow-up visit. Symptoms include chest pain and nervous/anxious behavior. Patient reports no dizziness, nausea, palpitations or shortness of breath.         Current Medication: Outpatient  Encounter Medications as of 08/26/2018  Medication Sig  . ALPRAZolam (XANAX) 0.5 MG tablet Take 1 tablet (0.5 mg total) by mouth 2 (two) times daily as needed for anxiety.  . cetirizine (ZYRTEC) 10 MG tablet TAKE 1 TABLET(S) BY MOUTH DAILY FOR ALLERGIES  . desvenlafaxine (PRISTIQ) 100 MG 24 hr tablet Take 1 tablet (100 mg total) by mouth daily.  . enalapril (VASOTEC) 20 MG tablet Take 1 tablet (20 mg total) by mouth 2 (two) times daily. (Patient taking differently: Take 40 mg by mouth daily. )  . hydrochlorothiazide (HYDRODIURIL) 25 MG tablet Take 1 tablet (25 mg total) by mouth daily.  . hydrOXYzine (ATARAX/VISTARIL) 10 MG tablet Take 1 tablet (10 mg total) by mouth 3 (three) times daily as needed.  . Multiple Vitamins-Calcium (ONE-A-DAY WOMENS PO) Take 1 tablet by mouth daily.  Marland Kitchen OVER THE COUNTER MEDICATION 1 tablet daily as needed. Takes generic stool softener for constipation d/t oxycodone.  . Pseudoeph-Doxylamine-DM-APAP (NYQUIL PO) Take 2 capsules by mouth at bedtime as needed (for sleep).  . [DISCONTINUED] ALPRAZolam (XANAX) 0.5 MG tablet Take 1 tablet (0.5 mg total) by mouth 3 (three) times daily as needed for anxiety.  Marland Kitchen oxyCODONE-acetaminophen (PERCOCET/ROXICET) 5-325 MG tablet Take 1 tablet by mouth every 4 (four) hours as needed for severe pain.   No facility-administered encounter medications on file as of 08/26/2018.     Surgical History: Past Surgical History:  Procedure Laterality Date  . CESAREAN SECTION  1992  . COLONOSCOPY     polyps removed first procedure. 2nd time all was clear  . FINGER SURGERY Left 2011   left finger cut off x  2.(only up to last digit)  . HEMATOMA EVACUATION Left 1999   upper part of foot was injured d/t 500lb weight landing on her foot.   Marland Kitchen LAPAROSCOPIC SALPINGO OOPHERECTOMY Left 04/02/2018   Procedure: LAPAROSCOPIC SALPINGO OOPHORECTOMY;  Surgeon: Malachy Mood, MD;  Location: [facility] ORS;  Service: Gynecology;  Laterality: Left;    Medical  History: Past Medical History:  Diagnosis Date  . Anginal pain (HCC)    tightness related to anxiety  . Anxiety   . Depression   . GERD (gastroesophageal reflux disease)    throws up easily but not diagnosed with reflux  . History of C-section 74  . Hypertension   . Sleep apnea    uses cpap    Family History: Family History  Problem Relation Age of Onset  . Breast cancer Mother 40       x 3 times  . Colon cancer Mother   . Brain cancer Mother   . Cancer - Colon Mother   . Cancer - Other Mother   . Heart Problems Father   . Colon cancer Brother   . Heart Problems Brother     Social History   Socioeconomic History  . Marital status: Divorced    Spouse name: Not on file  . Number of children: 1  . Years of education: Not on file  . Highest education level: Not on file  Occupational History  . Occupation: works in Secretary/administrator  Social Needs  . Financial resource strain: Not on file  . Food insecurity:    Worry: Not on file    Inability: Not on file  . Transportation needs:    Medical: Not on file    Non-medical: Not on file  Tobacco Use  . Smoking status: Never Smoker  . Smokeless tobacco: Never Used  Substance and Sexual Activity  . Alcohol use: No    Frequency: Never  . Drug use: No  . Sexual activity: Not Currently    Birth control/protection: Post-menopausal  Lifestyle  . Physical activity:    Days per week: Not on file    Minutes per session: Not on file  . Stress: Not on file  Relationships  . Social connections:    Talks on phone: Not on file    Gets together: Not on file    Attends religious service: Not on file    Active member of club or organization: Not on file    Attends meetings of clubs or organizations: Not on file    Relationship status: Not on file  . Intimate partner violence:    Fear of current or ex partner: Not on file    Emotionally abused: Not on file    Physically abused: Not on file    Forced sexual activity: Not on  file  Other Topics Concern  . Not on file  Social History Narrative   Son has schizophrenia and autism but is fully capable of helping mother after surgery      Review of Systems  Constitutional: Positive for fatigue. Negative for activity change, appetite change and unexpected weight change.  HENT: Negative for congestion, postnasal drip, rhinorrhea and sore throat.   Eyes: Negative.   Respiratory: Negative for chest tightness, shortness of breath and wheezing.   Cardiovascular: Positive for chest pain. Negative for palpitations.       Intermittent and worse with anxiety.  Gastrointestinal: Negative for abdominal pain, constipation, diarrhea, nausea and vomiting.  Endocrine: Negative for cold intolerance, heat intolerance, polydipsia  and polyuria.  Genitourinary: Negative for dysuria, flank pain, frequency and urgency.  Musculoskeletal: Positive for arthralgias, back pain and myalgias.  Skin: Negative for rash.  Allergic/Immunologic: Negative for environmental allergies.  Neurological: Positive for headaches. Negative for dizziness, weakness and numbness.  Hematological: Negative for adenopathy.  Psychiatric/Behavioral: Positive for behavioral problems and sleep disturbance. The patient is nervous/anxious.    Today's Vitals   08/26/18 0955  BP: (!) 144/80  Pulse: 67  Resp: 16  SpO2: 100%  Weight: (!) 342 lb (155.1 kg)  Height: 5\' 9"  (1.753 m)   Body mass index is 50.5 kg/m.  Physical Exam Vitals signs and nursing note reviewed.  Constitutional:      General: She is not in acute distress.    Appearance: She is well-developed. She is obese. She is not diaphoretic.  HENT:     Head: Normocephalic and atraumatic.     Mouth/Throat:     Pharynx: No oropharyngeal exudate.  Eyes:     Conjunctiva/sclera: Conjunctivae normal.     Pupils: Pupils are equal, round, and reactive to light.  Neck:     Musculoskeletal: Normal range of motion and neck supple.     Thyroid: No  thyromegaly.     Vascular: No JVD.     Trachea: No tracheal deviation.  Cardiovascular:     Rate and Rhythm: Normal rate and regular rhythm.     Heart sounds: Normal heart sounds. No murmur. No friction rub. No gallop.   Pulmonary:     Effort: Pulmonary effort is normal. No respiratory distress.     Breath sounds: Normal breath sounds. No wheezing or rales.  Chest:     Chest wall: No tenderness.  Abdominal:     Palpations: Abdomen is soft.     Tenderness: There is no guarding.  Musculoskeletal: Normal range of motion.     Comments: The patient continues to have moderate to severe lower back pain. This pain is worse when bending and twisting at the waist. She has difficult time finding comfortable seated position due to back pain. No visible or palpable bony abnormalities noted. First appointment with pain management is scheduled for 07/02/2018.   Lymphadenopathy:     Cervical: No cervical adenopathy.  Skin:    General: Skin is warm and dry.     Capillary Refill: Capillary refill takes less than 2 seconds.  Neurological:     Mental Status: She is alert and oriented to person, place, and time.     Cranial Nerves: No cranial nerve deficit.  Psychiatric:        Mood and Affect: Mood is anxious and depressed.        Speech: Speech normal.        Behavior: Behavior normal.        Thought Content: Thought content normal.        Judgment: Judgment normal.   Assessment/Plan: 1. Essential hypertension Generally stable. Continue bp medication as prescribed   2. Low back pain with sciatica, sciatica laterality unspecified, unspecified back pain laterality, unspecified chronicity Short term prescription for oxycodone/APAP 5/325mg  sent to patient's pharmacy. Advised her to use only when needed and for severe pain. Reviewed risks and possible side effects associated with narcotic pain medication, especially whe ntaking BZO for acute anxiety.  - oxyCODONE-acetaminophen (PERCOCET/ROXICET) 5-325 MG  tablet; Take 1 tablet by mouth every 4 (four) hours as needed for severe pain.  Dispense: 30 tablet; Refill: 0  3. GAD (generalized anxiety disorder) Continue pristiq every day.  Reduce dosing of alprazlam 0.5mg  to BID as needed for acute anxiety. Advised her to wean this off as tolerated.  - ALPRAZolam (XANAX) 0.5 MG tablet; Take 1 tablet (0.5 mg total) by mouth 2 (two) times daily as needed for anxiety.  Dispense: 60 tablet; Refill: 3  4. Seasonal allergic rhinitis due to pollen Continue cetirizine daily. Use hydroxyzine as needed and as prescribed   General Counseling: Ahnyla verbalizes understanding of the findings of todays visit and agrees with plan of treatment. I have discussed any further diagnostic evaluation that may be needed or ordered today. We also reviewed her medications today. she has been encouraged to call the office with any questions or concerns that should arise related to todays visit.  Reviewed risks and possible side effects associated with taking opiates, benzodiazepines and other CNS depressants. Combination of these could cause dizziness and drowsiness. Advised patient not to drive or operate machinery when taking these medications, as patient's and other's life can be at risk and will have consequences. Patient verbalized understanding in this matter. Dependence and abuse for these drugs will be monitored closely. A Controlled substance policy and procedure is on file which allows [facility] to order a urine drug screen test at any visit. Patient understands and agrees with the plan  This patient was seen by Leretha Pol FNP Collaboration with Dr Lavera Guise as a part of collaborative care agreement  Meds ordered this encounter  Medications  . ALPRAZolam (XANAX) 0.5 MG tablet    Sig: Take 1 tablet (0.5 mg total) by mouth 2 (two) times daily as needed for anxiety.    Dispense:  60 tablet    Refill:  3    Order Specific Question:   Supervising Provider     Answer:   Lavera Guise [[id number]]  . oxyCODONE-acetaminophen (PERCOCET/ROXICET) 5-325 MG tablet    Sig: Take 1 tablet by mouth every 4 (four) hours as needed for severe pain.    Dispense:  30 tablet    Refill:  0    Order Specific Question:   Supervising Provider    Answer:   Lavera Guise [[id number]]    Time spent: 82 Minutes      Dr Lavera Guise Internal medicine

## 2018-09-08 ENCOUNTER — Other Ambulatory Visit: Payer: Self-pay | Admitting: Nurse Practitioner

## 2018-09-08 DIAGNOSIS — F331 Major depressive disorder, recurrent, moderate: Secondary | ICD-10-CM

## 2018-09-17 ENCOUNTER — Other Ambulatory Visit: Payer: Self-pay

## 2018-09-17 DIAGNOSIS — I10 Essential (primary) hypertension: Secondary | ICD-10-CM

## 2018-09-17 MED ORDER — HYDROCHLOROTHIAZIDE 25 MG PO TABS: 25.0000 mg | ORAL_TABLET | Freq: Every day | ORAL | 5 refills | Status: DC

## 2018-09-18 ENCOUNTER — Other Ambulatory Visit: Payer: Self-pay

## 2018-09-18 ENCOUNTER — Encounter: Payer: Self-pay | Admitting: Nurse Practitioner

## 2018-09-18 ENCOUNTER — Ambulatory Visit: Payer: BC Managed Care – PPO | Admitting: Nurse Practitioner

## 2018-09-18 VITALS — BP 136/82 | HR 69 | Resp 16 | Ht 69.0 in | Wt 342.0 lb

## 2018-09-18 DIAGNOSIS — F411 Generalized anxiety disorder: Secondary | ICD-10-CM | POA: Diagnosis not present

## 2018-09-18 DIAGNOSIS — M5440 Lumbago with sciatica, unspecified side: Secondary | ICD-10-CM

## 2018-09-18 DIAGNOSIS — I10 Essential (primary) hypertension: Secondary | ICD-10-CM | POA: Diagnosis not present

## 2018-09-18 MED ORDER — OXYCODONE-ACETAMINOPHEN 5-325 MG PO TABS: 1.0000 | ORAL_TABLET | ORAL | 0 refills | Status: DC | PRN

## 2018-09-18 NOTE — Progress Notes (Signed)
[facility] [place], [place] [address]  Internal MEDICINE  Office Visit Note  Patient Name: Candace Clayton  [id number]  [id number]  Date of Service: 09/18/2018  Chief Complaint  Patient presents with  . Back Pain    still back pain, medication med refills     She she continues to have moderate to sever lower back pain. Recent MRI does show multi-level degenerative disc disease and multiple bulging discs. The pain in her lower back is described as a burning type pain and can be as bad as 8/10 in severity. The pain will get worse after standing for long periods of time as well as bending at the waist and lifting heavy objects. She will normally take oxycodone/APAP as needed to reduce the pain. Will bring the pain down to 4/10 in severity. She has had to increase her work hours. 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 She is now working up to twelve hours in a shift, six days per week. Back is hurting more, especkially on those days when she is working these extended hours.       Current Medication: Outpatient Encounter Medications as of 09/18/2018  Medication Sig  . ALPRAZolam (XANAX) 0.5 MG tablet Take 1 tablet (0.5 mg total) by mouth 2 (two) times daily as needed for anxiety.  . cetirizine (ZYRTEC) 10 MG tablet TAKE 1 TABLET(S) BY MOUTH DAILY FOR ALLERGIES  . desvenlafaxine (PRISTIQ) 100 MG 24 hr tablet TAKE 1 TABLET BY MOUTH EVERY DAY  . enalapril (VASOTEC) 20 MG tablet Take 1 tablet (20 mg total) by mouth 2 (two) times daily. (Patient taking differently: Take 40 mg by mouth daily. )  . hydrochlorothiazide (HYDRODIURIL) 25 MG tablet Take 1 tablet (25 mg total) by mouth daily.  . hydrOXYzine (ATARAX/VISTARIL) 10 MG tablet Take 1 tablet (10 mg total) by mouth 3 (three) times daily as needed.  . Multiple Vitamins-Calcium (ONE-A-DAY WOMENS PO) Take 1 tablet by mouth daily.  Marland Kitchen OVER THE COUNTER MEDICATION 1 tablet daily as needed. Takes generic stool softener  for constipation d/t oxycodone.  Marland Kitchen oxyCODONE-acetaminophen (PERCOCET/ROXICET) 5-325 MG tablet Take 1 tablet by mouth every 4 (four) hours as needed for severe pain.  . [DISCONTINUED] oxyCODONE-acetaminophen (PERCOCET/ROXICET) 5-325 MG tablet Take 1 tablet by mouth every 4 (four) hours as needed for severe pain.  . [DISCONTINUED] Pseudoeph-Doxylamine-DM-APAP (NYQUIL PO) Take 2 capsules by mouth at bedtime as needed (for sleep).   No facility-administered encounter medications on file as of 09/18/2018.     Surgical History: Past Surgical History:  Procedure Laterality Date  . CESAREAN SECTION  1992  . COLONOSCOPY     polyps removed first procedure. 2nd time all was clear  . FINGER SURGERY Left 2011   left finger cut off x 2.(only up to last digit)  . HEMATOMA EVACUATION Left 1999   upper part of foot was injured d/t 500lb weight landing on her foot.   Marland Kitchen LAPAROSCOPIC SALPINGO OOPHERECTOMY Left 04/02/2018   Procedure: LAPAROSCOPIC SALPINGO OOPHORECTOMY;  Surgeon: Malachy Mood, MD;  Location: [facility] ORS;  Service: Gynecology;  Laterality: Left;    Medical History: Past Medical History:  Diagnosis Date  . Anginal pain (HCC)    tightness related to anxiety  . Anxiety   . Depression   . GERD (gastroesophageal reflux disease)    throws up easily but not diagnosed with reflux  . History of C-section 10  . Hypertension   . Sleep apnea    uses cpap  Family History: Family History  Problem Relation Age of Onset  . Breast cancer Mother 40       x 3 times  . Colon cancer Mother   . Brain cancer Mother   . Cancer - Colon Mother   . Cancer - Other Mother   . Heart Problems Father   . Colon cancer Brother   . Heart Problems Brother     Social History   Socioeconomic History  . Marital status: Divorced    Spouse name: Not on file  . Number of children: 1  . Years of education: Not on file  . Highest education level: Not on file  Occupational History  . Occupation: works  in Secretary/administrator  Social Needs  . Financial resource strain: Not on file  . Food insecurity:    Worry: Not on file    Inability: Not on file  . Transportation needs:    Medical: Not on file    Non-medical: Not on file  Tobacco Use  . Smoking status: Never Smoker  . Smokeless tobacco: Never Used  Substance and Sexual Activity  . Alcohol use: No    Frequency: Never  . Drug use: No  . Sexual activity: Not Currently    Birth control/protection: Post-menopausal  Lifestyle  . Physical activity:    Days per week: Not on file    Minutes per session: Not on file  . Stress: Not on file  Relationships  . Social connections:    Talks on phone: Not on file    Gets together: Not on file    Attends religious service: Not on file    Active member of club or organization: Not on file    Attends meetings of clubs or organizations: Not on file    Relationship status: Not on file  . Intimate partner violence:    Fear of current or ex partner: Not on file    Emotionally abused: Not on file    Physically abused: Not on file    Forced sexual activity: Not on file  Other Topics Concern  . Not on file  Social History Narrative   Son has schizophrenia and autism but is fully capable of helping mother after surgery      Review of Systems  Constitutional: Positive for fatigue. Negative for activity change, appetite change and unexpected weight change.  HENT: Negative for congestion, postnasal drip, rhinorrhea and sore throat.   Respiratory: Negative for chest tightness, shortness of breath and wheezing.   Cardiovascular: Negative for chest pain and palpitations.  Gastrointestinal: Negative for abdominal pain, constipation, diarrhea, nausea and vomiting.  Endocrine: Negative for cold intolerance, heat intolerance, polydipsia and polyuria.  Musculoskeletal: Positive for arthralgias, back pain and myalgias.  Skin: Negative for rash.  Allergic/Immunologic: Negative for environmental  allergies.  Neurological: Positive for headaches. Negative for dizziness, weakness and numbness.  Hematological: Negative for adenopathy.  Psychiatric/Behavioral: Positive for behavioral problems. Negative for sleep disturbance. The patient is nervous/anxious.    Today's Vitals   09/18/18 1013  BP: 136/82  Pulse: 69  Resp: 16  SpO2: 97%  Weight: (!) 342 lb (155.1 kg)  Height: 5\' 9"  (1.753 m)   Body mass index is 50.5 kg/m.  Physical Exam Vitals signs and nursing note reviewed.  Constitutional:      General: She is not in acute distress.    Appearance: She is well-developed. She is obese. She is not diaphoretic.  HENT:     Head: Normocephalic and atraumatic.  Mouth/Throat:     Pharynx: No oropharyngeal exudate.  Eyes:     Conjunctiva/sclera: Conjunctivae normal.     Pupils: Pupils are equal, round, and reactive to light.  Neck:     Musculoskeletal: Normal range of motion and neck supple.     Thyroid: No thyromegaly.     Vascular: No JVD.     Trachea: No tracheal deviation.  Cardiovascular:     Rate and Rhythm: Normal rate and regular rhythm.     Heart sounds: Normal heart sounds. No murmur. No friction rub. No gallop.   Pulmonary:     Effort: Pulmonary effort is normal. No respiratory distress.     Breath sounds: Normal breath sounds. No wheezing or rales.  Chest:     Chest wall: No tenderness.  Abdominal:     Palpations: Abdomen is soft.     Tenderness: There is no abdominal tenderness. There is no guarding.  Musculoskeletal: Normal range of motion.     Comments: The patient continues to have moderate to severe lower back pain. This pain is worse when bending and twisting at the waist. She has difficult time finding comfortable seated position due to back pain. No visible or palpable bony abnormalities noted.   Lymphadenopathy:     Cervical: No cervical adenopathy.  Skin:    General: Skin is warm and dry.     Capillary Refill: Capillary refill takes less than 2  seconds.  Neurological:     Mental Status: She is alert and oriented to person, place, and time.     Cranial Nerves: No cranial nerve deficit.  Psychiatric:        Mood and Affect: Mood is anxious and depressed.        Speech: Speech normal.        Behavior: Behavior normal.        Thought Content: Thought content normal.        Judgment: Judgment normal.    Assessment/Plan: 1. Low back pain with sciatica, sciatica laterality unspecified, unspecified back pain laterality, unspecified chronicity New, short term prescription given for oxycodone/APAP 5/325mg  tablets. Prescription written at every 4 hours as needed, however, patient to take only when needed and only when at home, not driving, or working. She understands risks and possible side effects associated with taking narcotic pain medications.  - oxyCODONE-acetaminophen (PERCOCET/ROXICET) 5-325 MG tablet; Take 1 tablet by mouth every 4 (four) hours as needed for severe pain.  Dispense: 30 tablet; Refill: 0  2. GAD (generalized anxiety disorder) Continue pristiq 100mg  every day. May use alprazolam as needed and as prescribed. Continue to wean down dosing of alprazolam as tolerated.   3. Essential hypertension Stable. Continue bp medication as prescribed   General Counseling: Amala verbalizes understanding of the findings of todays visit and agrees with plan of treatment. I have discussed any further diagnostic evaluation that may be needed or ordered today. We also reviewed her medications today. she has been encouraged to call the office with any questions or concerns that should arise related to todays visit.   Reviewed risks and possible side effects associated with taking opiates, benzodiazepines and other CNS depressants. Combination of these could cause dizziness and drowsiness. Advised patient not to drive or operate machinery when taking these medications, as patient's and other's life can be at risk and will have consequences.  Patient verbalized understanding in this matter. Dependence and abuse for these drugs will be monitored closely. A Controlled substance policy and procedure is on file  which allows [facility] to order a urine drug screen test at any visit. Patient understands and agrees with the plan  This patient was seen by Leretha Pol FNP Collaboration with Dr Lavera Guise as a part of collaborative care agreement  Meds ordered this encounter  Medications  . oxyCODONE-acetaminophen (PERCOCET/ROXICET) 5-325 MG tablet    Sig: Take 1 tablet by mouth every 4 (four) hours as needed for severe pain.    Dispense:  30 tablet    Refill:  0    Ok to fill 09/18/2018    Order Specific Question:   Supervising Provider    Answer:   Lavera Guise [[id number]]    Time spent: 67 Minutes      Dr Lavera Guise Internal medicine

## 2018-10-02 DIAGNOSIS — G4733 Obstructive sleep apnea (adult) (pediatric): Secondary | ICD-10-CM | POA: Diagnosis not present

## 2018-10-08 ENCOUNTER — Ambulatory Visit: Payer: BC Managed Care – PPO | Admitting: Nurse Practitioner

## 2018-10-08 ENCOUNTER — Encounter: Payer: Self-pay | Admitting: Nurse Practitioner

## 2018-10-08 ENCOUNTER — Other Ambulatory Visit: Payer: Self-pay

## 2018-10-08 VITALS — Ht 69.0 in | Wt 340.0 lb

## 2018-10-08 DIAGNOSIS — M5440 Lumbago with sciatica, unspecified side: Secondary | ICD-10-CM | POA: Diagnosis not present

## 2018-10-08 DIAGNOSIS — I10 Essential (primary) hypertension: Secondary | ICD-10-CM

## 2018-10-08 DIAGNOSIS — F411 Generalized anxiety disorder: Secondary | ICD-10-CM | POA: Diagnosis not present

## 2018-10-08 MED ORDER — OXYCODONE-ACETAMINOPHEN 5-325 MG PO TABS: 1.0000 | ORAL_TABLET | ORAL | 0 refills | Status: DC | PRN

## 2018-10-08 NOTE — Progress Notes (Signed)
[facility] [place], [place] [address]  Internal MEDICINE  Telephone Visit  Patient Name: Candace Clayton  [id number]  [id number]  Date of Service: 10/23/2018  I connected with the patient at 3:00pm by telephone and verified the patients identity using two identifiers.   I discussed the limitations, risks, security and privacy concerns of performing an evaluation and management service by telephone and the availability of in person appointments. I also discussed with the patient that there may be a patient responsible charge related to the service.  The patient expressed understanding and agrees to proceed.    Chief Complaint  Patient presents with  . Telephone Assessment  . Telephone Screen  . Hypertension    The patient has been contacted via telephone for follow up visit due to concerns for spread of novel coronavirus. She she continues to have moderate to sever lower back pain. Recent MRI does show multi-level degenerative disc disease and multiple bulging discs. The pain in her lower back is described as a burning type pain and can be as bad as 8/10 in severity. The pain will get worse after standing for long periods of time as well as bending at the waist and lifting heavy objects. She will normally take oxycodone/APAP as needed to reduce the pain. Will bring the pain down to 4/10 in severity. She has had to increase her work hours. 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 She is now working up to twelve hours in a shift, six days per week. Back is hurting more, especkially on those days when she is working these extended hours.       Current Medication: Outpatient Encounter Medications as of 10/08/2018  Medication Sig  . ALPRAZolam (XANAX) 0.5 MG tablet Take 1 tablet (0.5 mg total) by mouth 2 (two) times daily as needed for anxiety.  . cetirizine (ZYRTEC) 10 MG tablet TAKE 1 TABLET(S) BY MOUTH DAILY FOR ALLERGIES  . desvenlafaxine (PRISTIQ) 100 MG 24  hr tablet TAKE 1 TABLET BY MOUTH EVERY DAY  . hydrochlorothiazide (HYDRODIURIL) 25 MG tablet Take 1 tablet (25 mg total) by mouth daily.  . hydrOXYzine (ATARAX/VISTARIL) 10 MG tablet Take 1 tablet (10 mg total) by mouth 3 (three) times daily as needed.  . Multiple Vitamins-Calcium (ONE-A-DAY WOMENS PO) Take 1 tablet by mouth daily.  Marland Kitchen OVER THE COUNTER MEDICATION 1 tablet daily as needed. Takes generic stool softener for constipation d/t oxycodone.  Marland Kitchen oxyCODONE-acetaminophen (PERCOCET/ROXICET) 5-325 MG tablet Take 1 tablet by mouth every 4 (four) hours as needed for severe pain.  . [DISCONTINUED] enalapril (VASOTEC) 20 MG tablet Take 1 tablet (20 mg total) by mouth 2 (two) times daily. (Patient taking differently: Take 40 mg by mouth daily. )  . [DISCONTINUED] oxyCODONE-acetaminophen (PERCOCET/ROXICET) 5-325 MG tablet Take 1 tablet by mouth every 4 (four) hours as needed for severe pain.   No facility-administered encounter medications on file as of 10/08/2018.     Surgical History: Past Surgical History:  Procedure Laterality Date  . CESAREAN SECTION  1992  . COLONOSCOPY     polyps removed first procedure. 2nd time all was clear  . FINGER SURGERY Left 2011   left finger cut off x 2.(only up to last digit)  . HEMATOMA EVACUATION Left 1999   upper part of foot was injured d/t 500lb weight landing on her foot.   Marland Kitchen LAPAROSCOPIC SALPINGO OOPHERECTOMY Left 04/02/2018   Procedure: LAPAROSCOPIC SALPINGO OOPHORECTOMY;  Surgeon: Malachy Mood, MD;  Location: [facility] ORS;  Service: Gynecology;  Laterality: Left;    Medical History: Past Medical History:  Diagnosis Date  . Anginal pain (HCC)    tightness related to anxiety  . Anxiety   . Depression   . GERD (gastroesophageal reflux disease)    throws up easily but not diagnosed with reflux  . History of C-section 15  . Hypertension   . Sleep apnea    uses cpap    Family History: Family History  Problem Relation Age of Onset  . Breast  cancer Mother 40       x 3 times  . Colon cancer Mother   . Brain cancer Mother   . Cancer - Colon Mother   . Cancer - Other Mother   . Heart Problems Father   . Colon cancer Brother   . Heart Problems Brother     Social History   Socioeconomic History  . Marital status: Divorced    Spouse name: Not on file  . Number of children: 1  . Years of education: Not on file  . Highest education level: Not on file  Occupational History  . Occupation: works in Secretary/administrator  Social Needs  . Financial resource strain: Not on file  . Food insecurity:    Worry: Not on file    Inability: Not on file  . Transportation needs:    Medical: Not on file    Non-medical: Not on file  Tobacco Use  . Smoking status: Never Smoker  . Smokeless tobacco: Never Used  Substance and Sexual Activity  . Alcohol use: No    Frequency: Never  . Drug use: No  . Sexual activity: Not Currently    Birth control/protection: Post-menopausal  Lifestyle  . Physical activity:    Days per week: Not on file    Minutes per session: Not on file  . Stress: Not on file  Relationships  . Social connections:    Talks on phone: Not on file    Gets together: Not on file    Attends religious service: Not on file    Active member of club or organization: Not on file    Attends meetings of clubs or organizations: Not on file    Relationship status: Not on file  . Intimate partner violence:    Fear of current or ex partner: Not on file    Emotionally abused: Not on file    Physically abused: Not on file    Forced sexual activity: Not on file  Other Topics Concern  . Not on file  Social History Narrative   Son has schizophrenia and autism but is fully capable of helping mother after surgery      Review of Systems  Constitutional: Positive for fatigue. Negative for activity change, appetite change and unexpected weight change.  HENT: Negative for congestion, postnasal drip, rhinorrhea and sore throat.    Respiratory: Negative for chest tightness, shortness of breath and wheezing.   Cardiovascular: Negative for chest pain and palpitations.  Gastrointestinal: Negative for abdominal pain, constipation, diarrhea, nausea and vomiting.  Endocrine: Negative for cold intolerance, heat intolerance, polydipsia and polyuria.  Musculoskeletal: Positive for arthralgias, back pain and myalgias.  Skin: Negative for rash.  Allergic/Immunologic: Negative for environmental allergies.  Neurological: Positive for headaches. Negative for dizziness, weakness and numbness.  Hematological: Negative for adenopathy.  Psychiatric/Behavioral: Positive for behavioral problems. Negative for sleep disturbance. The patient is nervous/anxious.     Today's Vitals   10/08/18 1441  Weight: (!) 340  lb (154.2 kg)  Height: 5\' 9"  (1.753 m)   Body mass index is 50.21 kg/m.   Observation/Objective:  The patient is alert and oriented. She does seem to be in moderate pain. She is holding her back when moving from seated to standing position. Grimacing present when bending at the waist. She is in no acute distress.    Assessment/Plan:  1. Low back pain with sciatica, sciatica laterality unspecified, unspecified back pain laterality, unspecified chronicity New, short-term for oxycodone/APAP 5/325mg  tablets given. Patient to take only as needed. Reviewed risk factors and possible side effects associated with taking narcotic pain medication.  - oxyCODONE-acetaminophen (PERCOCET/ROXICET) 5-325 MG tablet; Take 1 tablet by mouth every 4 (four) hours as needed for severe pain.  Dispense: 45 tablet; Refill: 0  2. Essential hypertension Stable. Continue bp medication as prescribed.   3. GAD (generalized anxiety disorder) Continue to wean dosing of alprazolam. Dong well and generally taking only one tablet daily when needed.    General Counseling: Irini verbalizes understanding of the findings of today's phone visit and agrees  with plan of treatment. I have discussed any further diagnostic evaluation that may be needed or ordered today. We also reviewed her medications today. she has been encouraged to call the office with any questions or concerns that should arise related to todays visit.  Reviewed risks and possible side effects associated with taking opiates, benzodiazepines and other CNS depressants. Combination of these could cause dizziness and drowsiness. Advised patient not to drive or operate machinery when taking these medications, as patient's and other's life can be at risk and will have consequences. Patient verbalized understanding in this matter. Dependence and abuse for these drugs will be monitored closely. A Controlled substance policy and procedure is on file which allows [facility] to order a urine drug screen test at any visit. Patient understands and agrees with the plan  This patient was seen by Leretha Pol FNP Collaboration with Dr Lavera Guise as a part of collaborative care agreement  Meds ordered this encounter  Medications  . oxyCODONE-acetaminophen (PERCOCET/ROXICET) 5-325 MG tablet    Sig: Take 1 tablet by mouth every 4 (four) hours as needed for severe pain.    Dispense:  45 tablet    Refill:  0    Order Specific Question:   Supervising Provider    Answer:   Lavera Guise [[id number]]    Time spent: 6 Minutes    Dr Lavera Guise Internal medicine

## 2018-10-11 ENCOUNTER — Other Ambulatory Visit: Payer: Self-pay

## 2018-10-11 ENCOUNTER — Encounter: Payer: Self-pay | Admitting: Adult Health

## 2018-10-11 ENCOUNTER — Ambulatory Visit: Payer: BC Managed Care – PPO | Admitting: Adult Health

## 2018-10-11 VITALS — Resp 16 | Ht 69.0 in | Wt 340.0 lb

## 2018-10-11 DIAGNOSIS — J0110 Acute frontal sinusitis, unspecified: Secondary | ICD-10-CM

## 2018-10-11 DIAGNOSIS — H9202 Otalgia, left ear: Secondary | ICD-10-CM | POA: Diagnosis not present

## 2018-10-11 DIAGNOSIS — J029 Acute pharyngitis, unspecified: Secondary | ICD-10-CM

## 2018-10-11 MED ORDER — AMOXICILLIN-POT CLAVULANATE 875-125 MG PO TABS: 1.0000 | ORAL_TABLET | Freq: Two times a day (BID) | ORAL | 0 refills | Status: DC

## 2018-10-11 NOTE — Patient Instructions (Signed)

## 2018-10-11 NOTE — Progress Notes (Signed)
[facility] [place],  [address]  Internal MEDICINE  Telephone Visit  Patient Name: Candace Clayton  [id number]  [id number]  Date of Service: 10/11/2018  I connected with the patient at 1058 by telephone and verified the patients identity using two identifiers.  I discussed the limitations, risks, security and privacy concerns of performing an evaluation and management service by telephone and the availability of in person appointments. I also discussed with the patient that there may be a patient responsible charge related to the service.  The patient expressed understanding and agrees to proceed.    Chief Complaint  Patient presents with  . Telephone Screen  . Sore Throat    SWOLLEN GLANDS , LEFT EAR PAIN, STARTED WEDNESDAY   . Sinusitis  . Telephone Assessment    HPI  Pt reports swollen area from throat to ear.  She reports her throat is sore. She denies any fever recently.  Denies cough, but has some mild congestion. She reports a long history of issues with ear and throat infections.  Usually gets Z-Pak.     Current Medication: Outpatient Encounter Medications as of 10/11/2018  Medication Sig  . ALPRAZolam (XANAX) 0.5 MG tablet Take 1 tablet (0.5 mg total) by mouth 2 (two) times daily as needed for anxiety.  . cetirizine (ZYRTEC) 10 MG tablet TAKE 1 TABLET(S) BY MOUTH DAILY FOR ALLERGIES  . desvenlafaxine (PRISTIQ) 100 MG 24 hr tablet TAKE 1 TABLET BY MOUTH EVERY DAY  . enalapril (VASOTEC) 20 MG tablet Take 1 tablet (20 mg total) by mouth 2 (two) times daily. (Patient taking differently: Take 40 mg by mouth daily. )  . hydrochlorothiazide (HYDRODIURIL) 25 MG tablet Take 1 tablet (25 mg total) by mouth daily.  . hydrOXYzine (ATARAX/VISTARIL) 10 MG tablet Take 1 tablet (10 mg total) by mouth 3 (three) times daily as needed.  . Multiple Vitamins-Calcium (ONE-A-DAY WOMENS PO) Take 1 tablet by mouth daily.  Marland Kitchen OVER THE COUNTER MEDICATION 1 tablet daily  as needed. Takes generic stool softener for constipation d/t oxycodone.  Marland Kitchen oxyCODONE-acetaminophen (PERCOCET/ROXICET) 5-325 MG tablet Take 1 tablet by mouth every 4 (four) hours as needed for severe pain.  Marland Kitchen amoxicillin-clavulanate (AUGMENTIN) 875-125 MG tablet Take 1 tablet by mouth 2 (two) times daily.   No facility-administered encounter medications on file as of 10/11/2018.     Surgical History: Past Surgical History:  Procedure Laterality Date  . CESAREAN SECTION  1992  . COLONOSCOPY     polyps removed first procedure. 2nd time all was clear  . FINGER SURGERY Left 2011   left finger cut off x 2.(only up to last digit)  . HEMATOMA EVACUATION Left 1999   upper part of foot was injured d/t 500lb weight landing on her foot.   Marland Kitchen LAPAROSCOPIC SALPINGO OOPHERECTOMY Left 04/02/2018   Procedure: LAPAROSCOPIC SALPINGO OOPHORECTOMY;  Surgeon: Malachy Mood, MD;  Location: [facility] ORS;  Service: Gynecology;  Laterality: Left;    Medical History: Past Medical History:  Diagnosis Date  . Anginal pain (HCC)    tightness related to anxiety  . Anxiety   . Depression   . GERD (gastroesophageal reflux disease)    throws up easily but not diagnosed with reflux  . History of C-section 45  . Hypertension   . Sleep apnea    uses cpap    Family History: Family History  Problem Relation Age of Onset  . Breast cancer Mother 88       x 3  times  . Colon cancer Mother   . Brain cancer Mother   . Cancer - Colon Mother   . Cancer - Other Mother   . Heart Problems Father   . Colon cancer Brother   . Heart Problems Brother     Social History   Socioeconomic History  . Marital status: Divorced    Spouse name: Not on file  . Number of children: 1  . Years of education: Not on file  . Highest education level: Not on file  Occupational History  . Occupation: works in Secretary/administrator  Social Needs  . Financial resource strain: Not on file  . Food insecurity:    Worry: Not on file     Inability: Not on file  . Transportation needs:    Medical: Not on file    Non-medical: Not on file  Tobacco Use  . Smoking status: Never Smoker  . Smokeless tobacco: Never Used  Substance and Sexual Activity  . Alcohol use: No    Frequency: Never  . Drug use: No  . Sexual activity: Not Currently    Birth control/protection: Post-menopausal  Lifestyle  . Physical activity:    Days per week: Not on file    Minutes per session: Not on file  . Stress: Not on file  Relationships  . Social connections:    Talks on phone: Not on file    Gets together: Not on file    Attends religious service: Not on file    Active member of club or organization: Not on file    Attends meetings of clubs or organizations: Not on file    Relationship status: Not on file  . Intimate partner violence:    Fear of current or ex partner: Not on file    Emotionally abused: Not on file    Physically abused: Not on file    Forced sexual activity: Not on file  Other Topics Concern  . Not on file  Social History Narrative   Son has schizophrenia and autism but is fully capable of helping mother after surgery      Review of Systems  Constitutional: Negative for chills, fatigue and unexpected weight change.  HENT: Positive for ear pain and sore throat. Negative for congestion, rhinorrhea and sneezing.   Eyes: Negative for photophobia, pain and redness.  Respiratory: Negative for cough, chest tightness and shortness of breath.   Cardiovascular: Negative for chest pain and palpitations.  Gastrointestinal: Negative for abdominal pain, constipation, diarrhea, nausea and vomiting.  Endocrine: Negative.   Genitourinary: Negative for dysuria and frequency.  Musculoskeletal: Negative for arthralgias, back pain, joint swelling and neck pain.  Skin: Negative for rash.  Allergic/Immunologic: Negative.   Neurological: Negative for tremors and numbness.  Hematological: Negative for adenopathy. Does not bruise/bleed  easily.  Psychiatric/Behavioral: Negative for behavioral problems and sleep disturbance. The patient is not nervous/anxious.     Vital Signs: Resp 16   Ht 5\' 9"  (1.753 m)   Wt (!) 340 lb (154.2 kg)   BMI 50.21 kg/m    Observation/Objective:  Speaking in full sentences without difficulty.    Assessment/Plan: 1. Left ear pain Advised patient to take entire course of antibiotics as prescribed with food. Pt should return to clinic in 7-10 days if symptoms fail to improve or new symptoms develop.  - amoxicillin-clavulanate (AUGMENTIN) 875-125 MG tablet; Take 1 tablet by mouth 2 (two) times daily.  Dispense: 14 tablet; Refill: 0  2. Acute non-recurrent frontal sinusitis -  amoxicillin-clavulanate (AUGMENTIN) 875-125 MG tablet; Take 1 tablet by mouth 2 (two) times daily.  Dispense: 14 tablet; Refill: 0  3. Sore throat Likely due to drainage, worse in the morning.  Will follow up in clinic if new or worse symptoms develop.  General Counseling: Dreonna verbalizes understanding of the findings of today's phone visit and agrees with plan of treatment. I have discussed any further diagnostic evaluation that may be needed or ordered today. We also reviewed her medications today. she has been encouraged to call the office with any questions or concerns that should arise related to todays visit.    No orders of the defined types were placed in this encounter.   Meds ordered this encounter  Medications  . amoxicillin-clavulanate (AUGMENTIN) 875-125 MG tablet    Sig: Take 1 tablet by mouth 2 (two) times daily.    Dispense:  14 tablet    Refill:  0    Time spent: [place] AGNP-C Internal medicine

## 2018-10-23 ENCOUNTER — Other Ambulatory Visit: Payer: Self-pay

## 2018-10-23 DIAGNOSIS — I10 Essential (primary) hypertension: Secondary | ICD-10-CM

## 2018-10-23 MED ORDER — ENALAPRIL MALEATE 20 MG PO TABS: 20.0000 mg | ORAL_TABLET | Freq: Two times a day (BID) | ORAL | 5 refills | Status: DC

## 2018-10-25 ENCOUNTER — Encounter: Payer: Self-pay | Admitting: Nurse Practitioner

## 2018-10-25 ENCOUNTER — Ambulatory Visit: Payer: BC Managed Care – PPO | Admitting: Nurse Practitioner

## 2018-10-25 ENCOUNTER — Other Ambulatory Visit: Payer: Self-pay

## 2018-10-25 VITALS — Ht 69.0 in | Wt 340.0 lb

## 2018-10-25 DIAGNOSIS — I10 Essential (primary) hypertension: Secondary | ICD-10-CM | POA: Diagnosis not present

## 2018-10-25 DIAGNOSIS — M5440 Lumbago with sciatica, unspecified side: Secondary | ICD-10-CM

## 2018-10-25 MED ORDER — OXYCODONE-ACETAMINOPHEN 5-325 MG PO TABS: 1.0000 | ORAL_TABLET | ORAL | 0 refills | Status: DC | PRN

## 2018-10-25 NOTE — Progress Notes (Signed)
[facility] [place], [place] [address]  Internal MEDICINE  Telephone Visit  Patient Name: Candace Clayton  [id number]  [id number]  Date of Service: 10/25/2018  I connected with the patient at 10:18am by telephone and verified the patients identity using two identifiers.   I discussed the limitations, risks, security and privacy concerns of performing an evaluation and management service by telephone and the availability of in person appointments. I also discussed with the patient that there may be a patient responsible charge related to the service.  The patient expressed understanding and agrees to proceed.    Chief Complaint  Patient presents with  . Telephone Screen    PHONE VISIT [phone]  . Telephone Assessment  . Medical Management of Chronic Issues    follow up medication refill    The patient has been contacted via telephone for follow up visit due to concerns for spread of novel coronavirus. She she continues to have moderate to sever lower back pain. Recent MRI does show multi-level degenerative disc disease and multiple bulging discs. The pain in her lower back is described as a burning type pain and can be as bad as 8/10 in severity. The pain will get worse after standing for long periods of time as well as bending at the waist and lifting heavy objects. She will normally take oxycodone/APAP as needed to reduce the pain. Will bring the pain down to 4/10 in severity. She has had to increase her work hours. 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 She is now working up to thirteen hours in a shift, six days per week. Back is hurting more, especkially on those days when she is working these extended hours.        Current Medication: Outpatient Encounter Medications as of 10/25/2018  Medication Sig  . ALPRAZolam (XANAX) 0.5 MG tablet Take 1 tablet (0.5 mg total) by mouth 2 (two) times daily as needed for anxiety.  . cetirizine (ZYRTEC) 10 MG  tablet TAKE 1 TABLET(S) BY MOUTH DAILY FOR ALLERGIES  . desvenlafaxine (PRISTIQ) 100 MG 24 hr tablet TAKE 1 TABLET BY MOUTH EVERY DAY  . enalapril (VASOTEC) 20 MG tablet Take 1 tablet (20 mg total) by mouth 2 (two) times daily.  . hydrochlorothiazide (HYDRODIURIL) 25 MG tablet Take 1 tablet (25 mg total) by mouth daily.  . hydrOXYzine (ATARAX/VISTARIL) 10 MG tablet Take 1 tablet (10 mg total) by mouth 3 (three) times daily as needed.  . Multiple Vitamins-Calcium (ONE-A-DAY WOMENS PO) Take 1 tablet by mouth daily.  Marland Kitchen OVER THE COUNTER MEDICATION 1 tablet daily as needed. Takes generic stool softener for constipation d/t oxycodone.  Marland Kitchen oxyCODONE-acetaminophen (PERCOCET/ROXICET) 5-325 MG tablet Take 1 tablet by mouth every 4 (four) hours as needed for severe pain.  . [DISCONTINUED] oxyCODONE-acetaminophen (PERCOCET/ROXICET) 5-325 MG tablet Take 1 tablet by mouth every 4 (four) hours as needed for severe pain.  Marland Kitchen enalapril (VASOTEC) 10 MG tablet 10 mg.  . [DISCONTINUED] amoxicillin-clavulanate (AUGMENTIN) 875-125 MG tablet Take 1 tablet by mouth 2 (two) times daily. (Patient not taking: Reported on 10/25/2018)   No facility-administered encounter medications on file as of 10/25/2018.     Surgical History: Past Surgical History:  Procedure Laterality Date  . CESAREAN SECTION  1992  . COLONOSCOPY     polyps removed first procedure. 2nd time all was clear  . FINGER SURGERY Left 2011   left finger cut off x 2.(only up to last digit)  . HEMATOMA EVACUATION Left 1999  upper part of foot was injured d/t 500lb weight landing on her foot.   Marland Kitchen LAPAROSCOPIC SALPINGO OOPHERECTOMY Left 04/02/2018   Procedure: LAPAROSCOPIC SALPINGO OOPHORECTOMY;  Surgeon: Malachy Mood, MD;  Location: [facility] ORS;  Service: Gynecology;  Laterality: Left;    Medical History: Past Medical History:  Diagnosis Date  . Anginal pain (HCC)    tightness related to anxiety  . Anxiety   . Depression   . GERD (gastroesophageal  reflux disease)    throws up easily but not diagnosed with reflux  . History of C-section 68  . Hypertension   . Sleep apnea    uses cpap    Family History: Family History  Problem Relation Age of Onset  . Breast cancer Mother 40       x 3 times  . Colon cancer Mother   . Brain cancer Mother   . Cancer - Colon Mother   . Cancer - Other Mother   . Heart Problems Father   . Colon cancer Brother   . Heart Problems Brother     Social History   Socioeconomic History  . Marital status: Divorced    Spouse name: Not on file  . Number of children: 1  . Years of education: Not on file  . Highest education level: Not on file  Occupational History  . Occupation: works in Secretary/administrator  Social Needs  . Financial resource strain: Not on file  . Food insecurity:    Worry: Not on file    Inability: Not on file  . Transportation needs:    Medical: Not on file    Non-medical: Not on file  Tobacco Use  . Smoking status: Never Smoker  . Smokeless tobacco: Never Used  Substance and Sexual Activity  . Alcohol use: No    Frequency: Never  . Drug use: No  . Sexual activity: Not Currently    Birth control/protection: Post-menopausal  Lifestyle  . Physical activity:    Days per week: Not on file    Minutes per session: Not on file  . Stress: Not on file  Relationships  . Social connections:    Talks on phone: Not on file    Gets together: Not on file    Attends religious service: Not on file    Active member of club or organization: Not on file    Attends meetings of clubs or organizations: Not on file    Relationship status: Not on file  . Intimate partner violence:    Fear of current or ex partner: Not on file    Emotionally abused: Not on file    Physically abused: Not on file    Forced sexual activity: Not on file  Other Topics Concern  . Not on file  Social History Narrative   Son has schizophrenia and autism but is fully capable of helping mother after surgery       Review of Systems  Constitutional: Positive for fatigue. Negative for activity change, appetite change and unexpected weight change.  HENT: Negative for congestion, postnasal drip, rhinorrhea and sore throat.   Respiratory: Negative for chest tightness, shortness of breath and wheezing.   Cardiovascular: Negative for chest pain and palpitations.  Gastrointestinal: Negative for abdominal pain, constipation, diarrhea, nausea and vomiting.  Endocrine: Negative for cold intolerance, heat intolerance, polydipsia and polyuria.  Musculoskeletal: Positive for arthralgias, back pain and myalgias.  Skin: Negative for rash.  Allergic/Immunologic: Negative for environmental allergies.  Neurological: Positive for headaches. Negative for  dizziness, weakness and numbness.  Hematological: Negative for adenopathy.  Psychiatric/Behavioral: Positive for behavioral problems. Negative for sleep disturbance. The patient is nervous/anxious.    Today's Vitals   10/25/18 1002  Weight: (!) 340 lb (154.2 kg)  Height: 5\' 9"  (1.753 m)   Body mass index is 50.21 kg/m.   Observation/Objective: The patient is alert and oriented. She Is pleasant and answers all questions appropriately. She sounds worried. She is in no acute distress at this time.   Assessment/Plan: 1. Essential hypertension Stable. Continue blood pressure medication as prescribed   2. Low back pain with sciatica, sciatica laterality unspecified, unspecified back pain laterality, unspecified chronicity Renew oxycodone/APAP 5/325mg  tablets. Take as prescribed and no more. Reviewed risk factors and side effects associated with taking narcotic pain medication.  - oxyCODONE-acetaminophen (PERCOCET/ROXICET) 5-325 MG tablet; Take 1 tablet by mouth every 4 (four) hours as needed for severe pain.  Dispense: 45 tablet; Refill: 0  General Counseling: Makaria verbalizes understanding of the findings of today's phone visit and agrees with plan of  treatment. I have discussed any further diagnostic evaluation that may be needed or ordered today. We also reviewed her medications today. she has been encouraged to call the office with any questions or concerns that should arise related to todays visit.  Reviewed risks and possible side effects associated with taking opiates, benzodiazepines and other CNS depressants. Combination of these could cause dizziness and drowsiness. Advised patient not to drive or operate machinery when taking these medications, as patient's and other's life can be at risk and will have consequences. Patient verbalized understanding in this matter. Dependence and abuse for these drugs will be monitored closely. A Controlled substance policy and procedure is on file which allows [facility] to order a urine drug screen test at any visit. Patient understands and agrees with the plan  This patient was seen by Leretha Pol FNP Collaboration with Dr Lavera Guise as a part of collaborative care agreement  Meds ordered this encounter  Medications  . oxyCODONE-acetaminophen (PERCOCET/ROXICET) 5-325 MG tablet    Sig: Take 1 tablet by mouth every 4 (four) hours as needed for severe pain.    Dispense:  45 tablet    Refill:  0    Order Specific Question:   Supervising Provider    Answer:   Lavera Guise [[id number]]    Time spent: 39 Minutes    Dr Lavera Guise Internal medicine

## 2018-11-05 ENCOUNTER — Ambulatory Visit: Payer: BC Managed Care – PPO | Admitting: Nurse Practitioner

## 2018-11-05 ENCOUNTER — Encounter: Payer: Self-pay | Admitting: Nurse Practitioner

## 2018-11-05 ENCOUNTER — Other Ambulatory Visit: Payer: Self-pay

## 2018-11-05 DIAGNOSIS — F411 Generalized anxiety disorder: Secondary | ICD-10-CM

## 2018-11-05 DIAGNOSIS — I10 Essential (primary) hypertension: Secondary | ICD-10-CM

## 2018-11-05 DIAGNOSIS — M5440 Lumbago with sciatica, unspecified side: Secondary | ICD-10-CM | POA: Diagnosis not present

## 2018-11-05 MED ORDER — OXYCODONE-ACETAMINOPHEN 5-325 MG PO TABS: 1.0000 | ORAL_TABLET | ORAL | 0 refills | Status: DC | PRN

## 2018-11-05 NOTE — Progress Notes (Signed)
[facility] [place], [place] [address]  Internal MEDICINE  Telephone Visit  Patient Name: Candace Clayton  [id number]  [id number]  Date of Service: 11/05/2018  I connected with the patient at 3:14pm by telephone and verified the patients identity using two identifiers.   I discussed the limitations, risks, security and privacy concerns of performing an evaluation and management service by telephone and the availability of in person appointments. I also discussed with the patient that there may be a patient responsible charge related to the service.  The patient expressed understanding and agrees to proceed.    Chief Complaint  Patient presents with  . Telephone Assessment  . Telephone Screen  . Medication Refill  . Hypertension  . Anxiety    The patient has been contacted via telephone for follow up visit due to concerns for spread of novel coronavirus. She she continues to have moderate to sever lower back pain. Recent MRI does show multi-level degenerative disc disease and multiple bulging discs. The pain in her lower back is described as a burning type pain and can be as bad as 8/10 in severity. The pain will get worse after standing for long periods of time as well as bending at the waist and lifting heavy objects. She will normally take oxycodone/APAP as needed to reduce the pain. Will bring the pain down to 4/10 in severity. She has had to increase her work hours. 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 She is now working up to thirteen hours in a shift, six days per week. Back is hurting more, especkially on those days when she is working these extended hours.        Current Medication: Outpatient Encounter Medications as of 11/05/2018  Medication Sig  . ALPRAZolam (XANAX) 0.5 MG tablet Take 1 tablet (0.5 mg total) by mouth 2 (two) times daily as needed for anxiety.  . cetirizine (ZYRTEC) 10 MG tablet TAKE 1 TABLET(S) BY MOUTH DAILY FOR ALLERGIES   . desvenlafaxine (PRISTIQ) 100 MG 24 hr tablet TAKE 1 TABLET BY MOUTH EVERY DAY  . enalapril (VASOTEC) 20 MG tablet Take 1 tablet (20 mg total) by mouth 2 (two) times daily.  . hydrochlorothiazide (HYDRODIURIL) 25 MG tablet Take 1 tablet (25 mg total) by mouth daily.  . hydrOXYzine (ATARAX/VISTARIL) 10 MG tablet Take 1 tablet (10 mg total) by mouth 3 (three) times daily as needed.  . Multiple Vitamins-Calcium (ONE-A-DAY WOMENS PO) Take 1 tablet by mouth daily.  Marland Kitchen OVER THE COUNTER MEDICATION 1 tablet daily as needed. Takes generic stool softener for constipation d/t oxycodone.  Marland Kitchen oxyCODONE-acetaminophen (PERCOCET/ROXICET) 5-325 MG tablet Take 1 tablet by mouth every 4 (four) hours as needed for severe pain.  . [DISCONTINUED] enalapril (VASOTEC) 10 MG tablet 10 mg.  . [DISCONTINUED] oxyCODONE-acetaminophen (PERCOCET/ROXICET) 5-325 MG tablet Take 1 tablet by mouth every 4 (four) hours as needed for severe pain.   No facility-administered encounter medications on file as of 11/05/2018.     Surgical History: Past Surgical History:  Procedure Laterality Date  . CESAREAN SECTION  1992  . COLONOSCOPY     polyps removed first procedure. 2nd time all was clear  . FINGER SURGERY Left 2011   left finger cut off x 2.(only up to last digit)  . HEMATOMA EVACUATION Left 1999   upper part of foot was injured d/t 500lb weight landing on her foot.   Marland Kitchen LAPAROSCOPIC SALPINGO OOPHERECTOMY Left 04/02/2018   Procedure: LAPAROSCOPIC SALPINGO OOPHORECTOMY;  Surgeon: Georgianne Fick,  Conan Bowens, MD;  Location: [facility] ORS;  Service: Gynecology;  Laterality: Left;    Medical History: Past Medical History:  Diagnosis Date  . Anginal pain (HCC)    tightness related to anxiety  . Anxiety   . Depression   . GERD (gastroesophageal reflux disease)    throws up easily but not diagnosed with reflux  . History of C-section 74  . Hypertension   . Sleep apnea    uses cpap    Family History: Family History  Problem  Relation Age of Onset  . Breast cancer Mother 40       x 3 times  . Colon cancer Mother   . Brain cancer Mother   . Cancer - Colon Mother   . Cancer - Other Mother   . Heart Problems Father   . Colon cancer Brother   . Heart Problems Brother     Social History   Socioeconomic History  . Marital status: Divorced    Spouse name: Not on file  . Number of children: 1  . Years of education: Not on file  . Highest education level: Not on file  Occupational History  . Occupation: works in Secretary/administrator  Social Needs  . Financial resource strain: Not on file  . Food insecurity:    Worry: Not on file    Inability: Not on file  . Transportation needs:    Medical: Not on file    Non-medical: Not on file  Tobacco Use  . Smoking status: Never Smoker  . Smokeless tobacco: Never Used  Substance and Sexual Activity  . Alcohol use: No    Frequency: Never  . Drug use: No  . Sexual activity: Not Currently    Birth control/protection: Post-menopausal  Lifestyle  . Physical activity:    Days per week: Not on file    Minutes per session: Not on file  . Stress: Not on file  Relationships  . Social connections:    Talks on phone: Not on file    Gets together: Not on file    Attends religious service: Not on file    Active member of club or organization: Not on file    Attends meetings of clubs or organizations: Not on file    Relationship status: Not on file  . Intimate partner violence:    Fear of current or ex partner: Not on file    Emotionally abused: Not on file    Physically abused: Not on file    Forced sexual activity: Not on file  Other Topics Concern  . Not on file  Social History Narrative   Son has schizophrenia and autism but is fully capable of helping mother after surgery      Review of Systems  Constitutional: Positive for fatigue. Negative for activity change, appetite change and unexpected weight change.  HENT: Negative for congestion, postnasal drip,  rhinorrhea and sore throat.   Respiratory: Negative for chest tightness, shortness of breath and wheezing.   Cardiovascular: Negative for chest pain and palpitations.  Gastrointestinal: Negative for abdominal pain, constipation, diarrhea, nausea and vomiting.  Endocrine: Negative for cold intolerance, heat intolerance, polydipsia and polyuria.  Musculoskeletal: Positive for arthralgias, back pain and myalgias.  Skin: Negative for rash.  Allergic/Immunologic: Negative for environmental allergies.  Neurological: Negative for dizziness, weakness, numbness and headaches.  Hematological: Negative for adenopathy.  Psychiatric/Behavioral: Positive for behavioral problems. Negative for sleep disturbance. The patient is nervous/anxious.     Vital Signs: There were no  vitals taken for this visit.   Observation/Objective:   The patient is alert and oriented. She is pleasant and answers all questions appropriately. Breathing is non-labored. She is in no acute distress at this time.    Assessment/Plan:  1. Low back pain with sciatica, sciatica laterality unspecified, unspecified back pain laterality, unspecified chronicity Sent post- dated prescription to her pharmacy. May be filled on or after 11/12/2018. Will see her back in four weeks to reassess.  - oxyCODONE-acetaminophen (PERCOCET/ROXICET) 5-325 MG tablet; Take 1 tablet by mouth every 4 (four) hours as needed for severe pain.  Dispense: 45 tablet; Refill: 0  2. Essential hypertension Generally stable. Continue bp medication as prescribed   3. GAD (generalized anxiety disorder) Generally well controlled. Continue pristiq 100mg  daily. Has weaned alprazolam to 1 tablet daily. Encouraged her to wean this down slowly as tolerated.   General Counseling: Tiawanna verbalizes understanding of the findings of today's phone visit and agrees with plan of treatment. I have discussed any further diagnostic evaluation that may be needed or ordered today. We  also reviewed her medications today. she has been encouraged to call the office with any questions or concerns that should arise related to todays visit.   Reviewed risks and possible side effects associated with taking opiates, benzodiazepines and other CNS depressants. Combination of these could cause dizziness and drowsiness. Advised patient not to drive or operate machinery when taking these medications, as patient's and other's life can be at risk and will have consequences. Patient verbalized understanding in this matter. Dependence and abuse for these drugs will be monitored closely. A Controlled substance policy and procedure is on file which allows [facility] to order a urine drug screen test at any visit. Patient understands and agrees with the plan  This patient was seen by Leretha Pol FNP Collaboration with Dr Lavera Guise as a part of collaborative care agreement  Meds ordered this encounter  Medications  . oxyCODONE-acetaminophen (PERCOCET/ROXICET) 5-325 MG tablet    Sig: Take 1 tablet by mouth every 4 (four) hours as needed for severe pain.    Dispense:  45 tablet    Refill:  0    Can be filled on or after 11/12/2018    Order Specific Question:   Supervising Provider    Answer:   Lavera Guise [[id number]]    Time spent: 25 Minutes    Dr Lavera Guise Internal medicine

## 2018-11-27 ENCOUNTER — Encounter: Payer: Self-pay | Admitting: Nurse Practitioner

## 2018-11-27 ENCOUNTER — Ambulatory Visit: Payer: BC Managed Care – PPO | Admitting: Nurse Practitioner

## 2018-11-27 ENCOUNTER — Telehealth: Payer: Self-pay

## 2018-11-27 ENCOUNTER — Other Ambulatory Visit: Payer: Self-pay

## 2018-11-27 VITALS — Ht 69.0 in

## 2018-11-27 DIAGNOSIS — I10 Essential (primary) hypertension: Secondary | ICD-10-CM | POA: Diagnosis not present

## 2018-11-27 DIAGNOSIS — M5440 Lumbago with sciatica, unspecified side: Secondary | ICD-10-CM

## 2018-11-27 DIAGNOSIS — F411 Generalized anxiety disorder: Secondary | ICD-10-CM

## 2018-11-27 MED ORDER — OXYCODONE-ACETAMINOPHEN 5-325 MG PO TABS: 1.0000 | ORAL_TABLET | ORAL | 0 refills | Status: DC | PRN

## 2018-11-27 NOTE — Progress Notes (Signed)
[facility] [place], [place] [address]  Internal MEDICINE  Telephone Visit  Patient Name: Candace Clayton  [id number]  [id number]  Date of Service: 11/27/2018  I connected with the patient at 11:02 by telephone and verified the patients identity using two identifiers.   I discussed the limitations, risks, security and privacy concerns of performing an evaluation and management service by telephone and the availability of in person appointments. I also discussed with the patient that there may be a patient responsible charge related to the service.  The patient expressed understanding and agrees to proceed.    Chief Complaint  Patient presents with  . Telephone Screen    VIDEO VISIT [id number]  . Telephone Assessment  . Pain    increased back pain    The patient has been contacted via telephone for follow up visit due to concerns for spread of novel coronavirus. She she continues to have moderate to sever lower back pain. Recent MRI does show multi-level degenerative disc disease and multiple bulging discs. The pain in her lower back is described as a burning type pain and can be as bad as 8/10 in severity. The pain will get worse after standing for long periods of time as well as bending at the waist and lifting heavy objects. She will normally take oxycodone/APAP as needed to reduce the pain. Will bring the pain down to 4/10 in severity. She has had to increase her work hours. 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 She is now working up to thirteen hours in a shift, six days per week. Back is hurting more, especkially on those days when she is working these extended hours.       Current Medication: Outpatient Encounter Medications as of 11/27/2018  Medication Sig  . ALPRAZolam (XANAX) 0.5 MG tablet Take 1 tablet (0.5 mg total) by mouth 2 (two) times daily as needed for anxiety.  . cetirizine (ZYRTEC) 10 MG tablet TAKE 1 TABLET(S) BY MOUTH DAILY FOR  ALLERGIES  . desvenlafaxine (PRISTIQ) 100 MG 24 hr tablet TAKE 1 TABLET BY MOUTH EVERY DAY  . enalapril (VASOTEC) 20 MG tablet Take 1 tablet (20 mg total) by mouth 2 (two) times daily.  . hydrochlorothiazide (HYDRODIURIL) 25 MG tablet Take 1 tablet (25 mg total) by mouth daily.  . hydrOXYzine (ATARAX/VISTARIL) 10 MG tablet Take 1 tablet (10 mg total) by mouth 3 (three) times daily as needed.  . Multiple Vitamins-Calcium (ONE-A-DAY WOMENS PO) Take 1 tablet by mouth daily.  Marland Kitchen OVER THE COUNTER MEDICATION 1 tablet daily as needed. Takes generic stool softener for constipation d/t oxycodone.  Marland Kitchen oxyCODONE-acetaminophen (PERCOCET/ROXICET) 5-325 MG tablet Take 1 tablet by mouth every 4 (four) hours as needed for severe pain.  . [DISCONTINUED] oxyCODONE-acetaminophen (PERCOCET/ROXICET) 5-325 MG tablet Take 1 tablet by mouth every 4 (four) hours as needed for severe pain.   No facility-administered encounter medications on file as of 11/27/2018.     Surgical History: Past Surgical History:  Procedure Laterality Date  . CESAREAN SECTION  1992  . COLONOSCOPY     polyps removed first procedure. 2nd time all was clear  . FINGER SURGERY Left 2011   left finger cut off x 2.(only up to last digit)  . HEMATOMA EVACUATION Left 1999   upper part of foot was injured d/t 500lb weight landing on her foot.   Marland Kitchen LAPAROSCOPIC SALPINGO OOPHERECTOMY Left 04/02/2018   Procedure: LAPAROSCOPIC SALPINGO OOPHORECTOMY;  Surgeon: Malachy Mood, MD;  Location: [facility] ORS;  Service: Gynecology;  Laterality: Left;    Medical History: Past Medical History:  Diagnosis Date  . Anginal pain (HCC)    tightness related to anxiety  . Anxiety   . Depression   . GERD (gastroesophageal reflux disease)    throws up easily but not diagnosed with reflux  . History of C-section 75  . Hypertension   . Sleep apnea    uses cpap    Family History: Family History  Problem Relation Age of Onset  . Breast cancer Mother 40        x 3 times  . Colon cancer Mother   . Brain cancer Mother   . Cancer - Colon Mother   . Cancer - Other Mother   . Heart Problems Father   . Colon cancer Brother   . Heart Problems Brother     Social History   Socioeconomic History  . Marital status: Divorced    Spouse name: Not on file  . Number of children: 1  . Years of education: Not on file  . Highest education level: Not on file  Occupational History  . Occupation: works in Secretary/administrator  Social Needs  . Financial resource strain: Not on file  . Food insecurity:    Worry: Not on file    Inability: Not on file  . Transportation needs:    Medical: Not on file    Non-medical: Not on file  Tobacco Use  . Smoking status: Never Smoker  . Smokeless tobacco: Never Used  Substance and Sexual Activity  . Alcohol use: No    Frequency: Never  . Drug use: No  . Sexual activity: Not Currently    Birth control/protection: Post-menopausal  Lifestyle  . Physical activity:    Days per week: Not on file    Minutes per session: Not on file  . Stress: Not on file  Relationships  . Social connections:    Talks on phone: Not on file    Gets together: Not on file    Attends religious service: Not on file    Active member of club or organization: Not on file    Attends meetings of clubs or organizations: Not on file    Relationship status: Not on file  . Intimate partner violence:    Fear of current or ex partner: Not on file    Emotionally abused: Not on file    Physically abused: Not on file    Forced sexual activity: Not on file  Other Topics Concern  . Not on file  Social History Narrative   Son has schizophrenia and autism but is fully capable of helping mother after surgery      Review of Systems  Constitutional: Positive for fatigue. Negative for activity change, appetite change and unexpected weight change.  HENT: Negative for congestion, postnasal drip, rhinorrhea and sore throat.   Respiratory: Negative for  chest tightness, shortness of breath and wheezing.   Cardiovascular: Negative for chest pain and palpitations.  Gastrointestinal: Negative for abdominal pain, constipation, diarrhea, nausea and vomiting.  Endocrine: Negative for cold intolerance, heat intolerance, polydipsia and polyuria.  Musculoskeletal: Positive for arthralgias, back pain and myalgias.  Skin: Negative for rash.  Allergic/Immunologic: Negative for environmental allergies.  Neurological: Negative for dizziness, weakness, numbness and headaches.  Hematological: Negative for adenopathy.  Psychiatric/Behavioral: Positive for behavioral problems. Negative for sleep disturbance. The patient is nervous/anxious.     Today's Vitals   11/27/18 1021  Height: 5\' 9"  (1.753 m)  Body mass index is 50.21 kg/m.   Observation/Objective:   The patient is alert and oriented. She is pleasant and answers all questions appropriately. Breathing is non-labored. She is in no acute distress at this time.    Assessment/Plan: 1. Essential hypertension Stable. Continue bp medication as prescribed   2. Low back pain with sciatica, sciatica laterality unspecified, unspecified back pain laterality, unspecified chronicity May take oxycodone/APAP 5/325mg  tablets as needed and as prescribed. A new prescription for #45 tablets sent to her pharmacy. Advised her to make appointment with pain management provider for further evaluation and treatment. Also advised her that this prescrtiption would have to last until her next in office visit 12/12/2018 - oxyCODONE-acetaminophen (PERCOCET/ROXICET) 5-325 MG tablet; Take 1 tablet by mouth every 4 (four) hours as needed for severe pain.  Dispense: 45 tablet; Refill: 0  3. GAD (generalized anxiety disorder) Has weaned her use of alprazolam to 1 tablet daily if needed   General Counseling: Cohen verbalizes understanding of the findings of today's phone visit and agrees with plan of treatment. I have discussed  any further diagnostic evaluation that may be needed or ordered today. We also reviewed her medications today. she has been encouraged to call the office with any questions or concerns that should arise related to todays visit.  Reviewed risks and possible side effects associated with taking opiates, benzodiazepines and other CNS depressants. Combination of these could cause dizziness and drowsiness. Advised patient not to drive or operate machinery when taking these medications, as patient's and other's life can be at risk and will have consequences. Patient verbalized understanding in this matter. Dependence and abuse for these drugs will be monitored closely. A Controlled substance policy and procedure is on file which allows [facility] to order a urine drug screen test at any visit. Patient understands and agrees with the plan  This patient was seen by Leretha Pol FNP Collaboration with Dr Lavera Guise as a part of collaborative care agreement  Meds ordered this encounter  Medications  . oxyCODONE-acetaminophen (PERCOCET/ROXICET) 5-325 MG tablet    Sig: Take 1 tablet by mouth every 4 (four) hours as needed for severe pain.    Dispense:  45 tablet    Refill:  0    Can be filled on or after 11/12/2018    Order Specific Question:   Supervising Provider    Answer:   Lavera Guise [[id number]]    Time spent: 25 Minutes    Dr Lavera Guise Internal medicine

## 2018-11-28 NOTE — Telephone Encounter (Signed)
No message

## 2018-12-02 ENCOUNTER — Encounter: Payer: Self-pay | Admitting: Student in an Organized Health Care Education/Training Program

## 2018-12-02 ENCOUNTER — Other Ambulatory Visit: Payer: Self-pay

## 2018-12-02 ENCOUNTER — Ambulatory Visit
Payer: BC Managed Care – PPO | Attending: Student in an Organized Health Care Education/Training Program | Admitting: Student in an Organized Health Care Education/Training Program

## 2018-12-02 DIAGNOSIS — G894 Chronic pain syndrome: Secondary | ICD-10-CM | POA: Diagnosis not present

## 2018-12-02 DIAGNOSIS — M5136 Other intervertebral disc degeneration, lumbar region: Secondary | ICD-10-CM | POA: Diagnosis not present

## 2018-12-02 DIAGNOSIS — M47816 Spondylosis without myelopathy or radiculopathy, lumbar region: Secondary | ICD-10-CM | POA: Diagnosis not present

## 2018-12-02 NOTE — Progress Notes (Signed)
Pain Management Virtual Encounter Note - Virtual Visit via Telephone Telehealth (real-time audio visits between healthcare provider and patient).   Patient's Phone No. & Preferred Pharmacy:  [phone] (home); [phone] (mobile); (Preferred) [phone] No e-mail address on record  [id number] - [place], [address] [address] Phone: [phone] Fax: [phone]  [facility] [address] [place] - [place]) [place] [address] Phone: [phone] Fax: [phone]    Pre-screening note:  Our staff contacted Candace Clayton and offered her an "in person", "face-to-face" appointment versus a telephone encounter. She indicated preferring the telephone encounter, at this time.   Reason for Virtual Visit: COVID-19*  Social distancing based on CDC and AMA recommendations.   I contacted Candace Clayton on 12/02/2018 via telephone.      I clearly identified myself as Candace Santa, MD. I verified that I was speaking with the correct person using two identifiers (Name: Candace Clayton, and date of birth: 30-Mar-1961).  Advanced Informed Consent I sought verbal advanced consent from Candace Clayton for virtual visit interactions. I informed Candace Clayton of possible security and privacy concerns, risks, and limitations associated with providing "not-in-person" medical evaluation and management services. I also informed Candace Clayton of the availability of "in-person" appointments. Finally, I informed her that there would be a charge for the virtual visit and that she could be  personally, fully or partially, financially responsible for it. Candace Clayton expressed understanding and agreed to proceed.   Historic Elements   [name] is a 58 y.o. year old, female patient evaluated today after her last encounter by our practice on 11/27/2018. Candace Clayton  has a past medical history of  Anginal pain ([place]), Anxiety, Depression, GERD (gastroesophageal reflux disease), History of C-section (1992), Hypertension, and Sleep apnea. She also  has a past surgical history that includes Cesarean section (1992); Finger surgery (Left, 2011); Hematoma evacuation (Left, 1999); Colonoscopy; and Laparoscopic salpingo oophorectomy (Left, 04/02/2018). Candace Clayton has a current medication list which includes the following prescription(s): alprazolam, cetirizine, desvenlafaxine, enalapril, hydrochlorothiazide, hydroxyzine, multiple vitamins-calcium, OVER THE COUNTER MEDICATION, and oxycodone-acetaminophen. She  reports that she has never smoked. She has never used smokeless tobacco. She reports that she does not drink alcohol or use drugs. Candace Clayton is allergic to gabapentin.   HPI  Today, she is being contacted for worsening of previously known (established) problem  Virtual visit for worsening axial low back pain.  Patient was informed at her previous visit that she would not be a candidate for chronic opioid therapy so long as she is on Xanax.  Patient continues to get refills of Xanax with her most recent fill being 11/22/2018.  Given the patient's morbid obesity and symptoms of obstructive sleep apnea, I will absolutely not prescribe chronic opioid medications for her while she has Xanax prescription.  I informed the patient that she must discontinue Xanax and her PMP cannot show any Xanax pills for at least 2 months at which point she will complete a urine drug screen and so long as that is negative for Xanax, she can be considered for low-dose opioid therapy.  Patient may be a good candidate for buprenorphine however again she will need to be off of her Xanax.  In terms of treatment options, patient will only be a candidate for interventional options.  Patient did get significant pain relief with her right L3, L4, L5,  S1 diagnostic facet medial branch nerve block.  She states that she continues to not  have sharp shooting pain.  I informed her that we could consider repeating the block she states that she will think about this.  PRN order placed.  Pertinent Labs   SAFETY SCREENING Profile No results found for: SARSCOV2NAA, COVIDSOURCE, STAPHAUREUS, MRSAPCR, HCVAB, HIV, PREGTESTUR Renal Function Lab Results  Component Value Date   BUN 20 04/03/2018   CREATININE 0.85 04/03/2018   GFRAA >60 04/03/2018   GFRNONAA >60 04/03/2018   Hepatic Function Lab Results  Component Value Date   AST 24 04/03/2018   ALT 28 04/03/2018   ALBUMIN 4.3 04/03/2018   UDS Summary  Date Value Ref Range Status  07/02/2018 FINAL  Final    Comment:    ==================================================================== TOXASSURE COMP DRUG ANALYSIS,UR ==================================================================== Test                             Result       Flag       Units Drug Present and Declared for Prescription Verification   Alprazolam                     86           EXPECTED   ng/mg creat   Alpha-hydroxyalprazolam        72           EXPECTED   ng/mg creat    Source of alprazolam is a scheduled prescription medication.    Alpha-hydroxyalprazolam is an expected metabolite of alprazolam.   Desmethylvenlafaxine           PRESENT      EXPECTED    Desmethylvenlafaxine may be present due to administration of    desvenlafaxine; it is also an expected metabolite of venlafaxine. Drug Present not Declared for Prescription Verification   Salicylate                     PRESENT      UNEXPECTED   Naproxen                       PRESENT      UNEXPECTED Drug Absent but Declared for Prescription Verification   Ephedrine/Pseudoephedrine      Not Detected UNEXPECTED   Gabapentin                     Not Detected UNEXPECTED   Acetaminophen                  Not Detected UNEXPECTED    Acetaminophen, as indicated in the declared medication list, is    not always detected even when used as directed.    Diclofenac                     Not Detected UNEXPECTED    Diclofenac, as indicated in the declared medication list, is not    always detected even when used as directed.   Doxylamine                     Not Detected UNEXPECTED   Dextromethorphan               Not Detected UNEXPECTED ==================================================================== Test  Result    Flag   Units      Ref Range   Creatinine              71               mg/dL      >=20 ==================================================================== Declared Medications:  The flagging and interpretation on this report are based on the  following declared medications.  Unexpected results may arise from  inaccuracies in the declared medications.  **Note: The testing scope of this panel includes these medications:  Alprazolam  Desvenlafaxine  Dextromethorphan  Doxylamine  Gabapentin  Pseudoephedrine  **Note: The testing scope of this panel does not include small to  moderate amounts of these reported medications:  Acetaminophen  Diclofenac  **Note: The testing scope of this panel does not include following  reported medications:  Acyclovir  Calcium  Cetirizine  Docusate (Stool Softener)  Enalapril  Hydrochlorothiazide  Multivitamin ==================================================================== For clinical consultation, please call [phone]. ====================================================================    Note: Above Lab results reviewed.   Assessment  The primary encounter diagnosis was Lumbar facet arthropathy (R>L). Diagnoses of Lumbar spondylosis, Lumbar degenerative disc disease, Morbid obesity ([place]), and Chronic pain syndrome were also pertinent to this visit.  Plan of Care  I am having Ludia G. Sebree maintain her Multiple Vitamins-Calcium (ONE-A-DAY WOMENS PO), OVER THE COUNTER MEDICATION, hydrOXYzine, cetirizine, ALPRAZolam, desvenlafaxine,  hydrochlorothiazide, enalapril, and oxyCODONE-acetaminophen. Orders:  Orders Placed This Encounter  Procedures  . LUMBAR FACET(MEDIAL BRANCH NERVE BLOCK) MBNB    Standing Status:   Standing    Number of Occurrences:   5    Standing Expiration Date:   12/02/2019    Scheduling Instructions:     Purpose: Diagnostic     Indication: Axial low back pain. Lumbosacral Spondylosis (M47.897).      Side: Right side     Level: L3-4, L4-5, & L5-S1 Facets (L3, L4, L5, & S1 Medial Branch Nerves)     Sedation: With Sedation.     TIMEFRAME: PRN procedure. (Ms. Syme will call when needed.)    Order Specific Question:   Where will this procedure be performed?    Answer:   [facility]   Follow-up plan: PRN lumbar facet medial branch nerve blocks  I discussed the assessment and treatment plan with the patient. The patient was provided an opportunity to ask questions and all were answered. The patient agreed with the plan and demonstrated an understanding of the instructions.  Patient advised to call back or seek an in-person evaluation if the symptoms or condition worsens.  Total duration of non-face-to-face encounter: 15 minutes.  Note by: Candace Santa, MD Date: 12/02/2018; Time: 2:20 PM  Note: This dictation was prepared with Dragon dictation. Any transcriptional errors that may result from this process are unintentional.  Disclaimer:  * Given the special circumstances of the COVID-19 pandemic, the federal government has announced that the Office for Civil Rights (OCR) will exercise its enforcement discretion and will not impose penalties on physicians using telehealth in the event of noncompliance with regulatory requirements under the [place] (HIPAA) in connection with the good faith provision of telehealth during the XBMWU-13 national public health emergency. ([place])

## 2018-12-09 ENCOUNTER — Ambulatory Visit: Payer: BLUE CROSS/BLUE SHIELD | Admitting: Nurse Practitioner

## 2018-12-12 ENCOUNTER — Other Ambulatory Visit: Payer: Self-pay

## 2018-12-12 ENCOUNTER — Telehealth: Payer: Self-pay | Admitting: Internal Medicine

## 2018-12-12 ENCOUNTER — Ambulatory Visit: Payer: BLUE CROSS/BLUE SHIELD | Admitting: Nurse Practitioner

## 2018-12-12 ENCOUNTER — Ambulatory Visit: Payer: BC Managed Care – PPO | Admitting: Internal Medicine

## 2018-12-12 DIAGNOSIS — M5440 Lumbago with sciatica, unspecified side: Secondary | ICD-10-CM

## 2018-12-12 DIAGNOSIS — F411 Generalized anxiety disorder: Secondary | ICD-10-CM

## 2018-12-12 DIAGNOSIS — G4733 Obstructive sleep apnea (adult) (pediatric): Secondary | ICD-10-CM

## 2018-12-12 DIAGNOSIS — Z9989 Dependence on other enabling machines and devices: Secondary | ICD-10-CM

## 2018-12-12 DIAGNOSIS — F1390 Sedative, hypnotic, or anxiolytic use, unspecified, uncomplicated: Secondary | ICD-10-CM

## 2018-12-12 MED ORDER — BUSPIRONE HCL 5 MG PO TABS: 5.0000 mg | ORAL_TABLET | Freq: Three times a day (TID) | ORAL | 1 refills | Status: DC

## 2018-12-12 NOTE — Progress Notes (Signed)
[facility] [place], [place] [address]  Internal MEDICINE  Telephone Visit  Patient Name: Candace Clayton  [id number]  [id number]  Date of Service: 12/12/2018  I connected with the patient at 1018 by telephone and verified the patients identity using two identifiers.   I discussed the limitations, risks, security and privacy concerns of performing an evaluation and management service by telephone and the availability of in person appointments. I also discussed with the patient that there may be a patient responsible charge related to the service.  The patient expressed understanding and agrees to proceed.    Chief Complaint  Patient presents with  . Medical Management of Chronic Issues    exposure to covid   . Hypertension  . Depression  . Medication Refill    Pt saw pain management last thursday    HPI Pt is connected through telephone to prevent the risk of pandemic of COVID-19 to address chronic pain, anxiety with depression. Pt has been on Pristiq and Xanax for a while, She has been on oxycodone for chronic back pain as well, this helps her with ADL's and to be functional at her work, there has been a concern from pain management about dependence. No opoid will be prescribed from pain management due to high risk for respiratory distress/ hypoventilation due  medical history of sleep apnea and morbid obesity. Spoke with patient she is willing to taper to off her Alprazolam. 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 ?? Non adherence to CPAP and other meds     Current Medication: Outpatient Encounter Medications as of 12/12/2018  Medication Sig  . ALPRAZolam (XANAX) 0.5 MG tablet Take 1 tablet (0.5 mg total) by mouth 2 (two) times daily as needed for anxiety. (Patient taking differently: Take 0.5 mg by mouth daily. )   . cetirizine (ZYRTEC) 10 MG tablet TAKE 1 TABLET(S) BY MOUTH DAILY FOR ALLERGIES  . desvenlafaxine (PRISTIQ) 100 MG 24 hr tablet TAKE 1 TABLET BY MOUTH EVERY DAY  . enalapril (VASOTEC) 20 MG tablet Take 1 tablet (20 mg total) by mouth 2 (two) times daily.  . hydrochlorothiazide (HYDRODIURIL) 25 MG tablet Take 1 tablet (25 mg total) by mouth daily.  . hydrOXYzine (ATARAX/VISTARIL) 10 MG tablet Take 1 tablet (10 mg total) by mouth 3 (three) times daily as needed.  . Multiple Vitamins-Calcium (ONE-A-DAY WOMENS PO) Take 1 tablet by mouth daily.  Marland Kitchen OVER THE COUNTER MEDICATION 1 tablet daily as needed. Takes generic stool softener for constipation d/t oxycodone.  Marland Kitchen oxyCODONE-acetaminophen (PERCOCET/ROXICET) 5-325 MG tablet Take 1 tablet by mouth every 4 (four) hours as needed for severe pain.  . busPIRone (BUSPAR) 5 MG tablet Take 1 tablet (5 mg total) by mouth 3 (three) times daily. Take one tab po bid for one week, then increase to tid for anxiety   No facility-administered encounter medications on file as of 12/12/2018.     Surgical History: Past Surgical History:  Procedure Laterality Date  . CESAREAN SECTION  1992  . COLONOSCOPY     polyps removed first procedure. 2nd time all was clear  . FINGER SURGERY Left 2011   left finger cut off x 2.(only up to last digit)  . HEMATOMA EVACUATION Left 1999   upper part of foot was injured d/t 500lb weight landing on her foot.   Marland Kitchen  LAPAROSCOPIC SALPINGO OOPHERECTOMY Left 04/02/2018   Procedure: LAPAROSCOPIC SALPINGO OOPHORECTOMY;  Surgeon: Malachy Mood, MD;  Location: [facility] ORS;  Service: Gynecology;  Laterality: Left;    Medical History: Past Medical History:  Diagnosis Date  . Anginal pain (HCC)    tightness related to anxiety  . Anxiety   . Depression   . GERD (gastroesophageal reflux disease)    throws up easily but not diagnosed with reflux  . History of C-section 46  . Hypertension   . Sleep apnea    uses cpap    Family  History: Family History  Problem Relation Age of Onset  . Breast cancer Mother 40       x 3 times  . Colon cancer Mother   . Brain cancer Mother   . Cancer - Colon Mother   . Cancer - Other Mother   . Heart Problems Father   . Colon cancer Brother   . Heart Problems Brother     Social History   Socioeconomic History  . Marital status: Divorced    Spouse name: Not on file  . Number of children: 1  . Years of education: Not on file  . Highest education level: Not on file  Occupational History  . Occupation: works in Secretary/administrator  Social Needs  . Financial resource strain: Not on file  . Food insecurity    Worry: Not on file    Inability: Not on file  . Transportation needs    Medical: Not on file    Non-medical: Not on file  Tobacco Use  . Smoking status: Never Smoker  . Smokeless tobacco: Never Used  Substance and Sexual Activity  . Alcohol use: No    Frequency: Never  . Drug use: No  . Sexual activity: Not Currently    Birth control/protection: Post-menopausal  Lifestyle  . Physical activity    Days per week: Not on file    Minutes per session: Not on file  . Stress: Not on file  Relationships  . Social Herbalist on phone: Not on file    Gets together: Not on file    Attends religious service: Not on file    Active member of club or organization: Not on file    Attends meetings of clubs or organizations: Not on file    Relationship status: Not on file  . Intimate partner violence    Fear of current or ex partner: Not on file    Emotionally abused: Not on file    Physically abused: Not on file    Forced sexual activity: Not on file  Other Topics Concern  . Not on file  Social History Narrative   Son has schizophrenia and autism but is fully capable of helping mother after surgery   Review of Systems  Constitutional: Negative for chills, diaphoresis and fatigue.  HENT: Negative for ear pain, postnasal drip and sinus pressure.   Eyes:  Negative for photophobia, discharge, redness, itching and visual disturbance.  Respiratory: Negative for cough, shortness of breath and wheezing.   Cardiovascular: Negative for palpitations and leg swelling.  Gastrointestinal: Negative for abdominal pain, constipation, diarrhea, nausea and vomiting.  Genitourinary: Negative for dysuria and flank pain.  Musculoskeletal: Positive for back pain. Negative for arthralgias, gait problem and neck pain.  Skin: Negative for color change.  Allergic/Immunologic: Negative for environmental allergies and food allergies.  Neurological: Negative for dizziness and headaches.  Hematological: Does not bruise/bleed easily.  Psychiatric/Behavioral: Negative for  agitation, behavioral problems (depression) and hallucinations.   Vital Signs: There were no vitals taken for this visit.   Observation/Objective: Pt seems to be anxious and had a somewhat a tremor in her voice. She has been at home for last 2 days. ?? covid exposure at work   Assessment/Plan: 1. Low back pain with sciatica, sciatica laterality unspecified, unspecified back pain laterality, unspecified chronicity - Strictly through pain management in future   2. OSA on CPAP - Pt needs to be seen in sleep clinic for downloads and to determine CPAP compliance   3. Sedative, hypnotic, or anxiolytic use, unspecified, uncomplicated - Start Buspar, taper Xanax, pt under stood the plan and said she will do anything  4. GAD (generalized anxiety disorder) - She might need Lexapro in place of Pristiq, will need to see psych in future   General Counseling: Ainara verbalizes understanding of the findings of today's phone visit and agrees with plan of treatment. I have discussed any further diagnostic evaluation that may be needed or ordered today. We also reviewed her medications today. she has been encouraged to call the office with any questions or concerns that should arise related to todays  visit.   Meds ordered this encounter  Medications  . busPIRone (BUSPAR) 5 MG tablet    Sig: Take 1 tablet (5 mg total) by mouth 3 (three) times daily. Take one tab po bid for one week, then increase to tid for anxiety    Dispense:  90 tablet    Refill:  1    Time spent:15 Minutes   Dr Lavera Guise Internal medicine

## 2018-12-12 NOTE — Telephone Encounter (Signed)
Spoke with patient after discussing medication refills with Dr Clayborn Bigness and Leretha Pol, we have switched her alprazolam to buspirone and she will need to follow up with pain management for injections or discussing other options now that we are switching her anxiety meds and if in the future if she depends on the alprazolam she will need to have a consult with psychiatry for other medication options, Leretha Pol can only manage her primary care health medications.Candace Clayton

## 2018-12-20 ENCOUNTER — Telehealth: Payer: Self-pay

## 2018-12-24 ENCOUNTER — Other Ambulatory Visit: Payer: Self-pay

## 2018-12-25 ENCOUNTER — Telehealth: Payer: Self-pay

## 2018-12-25 NOTE — Telephone Encounter (Signed)
Pt called that having side effects of zyrtec she having chest pain we advised her go to ED  and also called pain management because they take her off xanax for few months pt refused go to ED and still make appt  tomorrow for adam to been seen and again by tat advised her to go to ED

## 2018-12-26 ENCOUNTER — Other Ambulatory Visit: Payer: Self-pay

## 2018-12-26 ENCOUNTER — Encounter: Payer: Self-pay | Admitting: Adult Health

## 2018-12-26 ENCOUNTER — Ambulatory Visit: Payer: BC Managed Care – PPO | Admitting: Adult Health

## 2018-12-26 VITALS — BP 139/68 | HR 76 | Temp 98.2°F | Resp 16 | Ht 69.0 in | Wt 340.0 lb

## 2018-12-26 DIAGNOSIS — R300 Dysuria: Secondary | ICD-10-CM | POA: Diagnosis not present

## 2018-12-26 DIAGNOSIS — R079 Chest pain, unspecified: Secondary | ICD-10-CM | POA: Diagnosis not present

## 2018-12-26 DIAGNOSIS — G4733 Obstructive sleep apnea (adult) (pediatric): Secondary | ICD-10-CM | POA: Diagnosis not present

## 2018-12-26 DIAGNOSIS — Z79899 Other long term (current) drug therapy: Secondary | ICD-10-CM | POA: Diagnosis not present

## 2018-12-26 DIAGNOSIS — M5440 Lumbago with sciatica, unspecified side: Secondary | ICD-10-CM

## 2018-12-26 DIAGNOSIS — Z9989 Dependence on other enabling machines and devices: Secondary | ICD-10-CM

## 2018-12-26 DIAGNOSIS — I10 Essential (primary) hypertension: Secondary | ICD-10-CM

## 2018-12-26 DIAGNOSIS — F411 Generalized anxiety disorder: Secondary | ICD-10-CM

## 2018-12-26 LAB — POCT URINALYSIS DIPSTICK
Bilirubin, UA: NEGATIVE
Blood, UA: NEGATIVE
Glucose, UA: POSITIVE — AB
Leukocytes, UA: NEGATIVE
Nitrite, UA: NEGATIVE
Protein, UA: NEGATIVE
Spec Grav, UA: 1.02 (ref 1.010–1.025)
Urobilinogen, UA: 0.2 E.U./dL
pH, UA: 5 (ref 5.0–8.0)

## 2018-12-26 LAB — POCT URINE DRUG SCREEN
POC Amphetamine UR: NOT DETECTED
POC BENZODIAZEPINES UR: NOT DETECTED
POC Barbiturate UR: NOT DETECTED
POC Cocaine UR: NOT DETECTED
POC Ecstasy UR: NOT DETECTED
POC Marijuana UR: NOT DETECTED
POC Methadone UR: NOT DETECTED
POC Methamphetamine UR: NOT DETECTED
POC Opiate Ur: NOT DETECTED
POC Oxycodone UR: NOT DETECTED
POC PHENCYCLIDINE UR: NOT DETECTED
POC TRICYCLICS UR: NOT DETECTED

## 2018-12-26 NOTE — Progress Notes (Signed)
[facility] [place], [place] [address]  Internal MEDICINE  Office Visit Note  Patient Name: Candace Clayton  [id number]  [id number]  Date of Service: 12/26/2018  Chief Complaint  Patient presents with  . Chest Pain    left arm, facial tigling , started after medication stop    HPI Pt is here reporting facial tingling and left arm feeling. She has recently started Buspar.  She is currently taking 5mg  BID and is suppose to increase to TID in 3 days. Pt wants to make sure she is ok.         Current Medication: Outpatient Encounter Medications as of 12/26/2018  Medication Sig  . ALPRAZolam (XANAX) 0.5 MG tablet Take 1 tablet (0.5 mg total) by mouth 2 (two) times daily as needed for anxiety. (Patient taking differently: Take 0.5 mg by mouth daily. )  . busPIRone (BUSPAR) 5 MG tablet Take 1 tablet (5 mg total) by mouth 3 (three) times daily. Take one tab po bid for one week, then increase to tid for anxiety  . cetirizine (ZYRTEC) 10 MG tablet TAKE 1 TABLET(S) BY MOUTH DAILY FOR ALLERGIES  . desvenlafaxine (PRISTIQ) 100 MG 24 hr tablet TAKE 1 TABLET BY MOUTH EVERY DAY  . enalapril (VASOTEC) 20 MG tablet Take 1 tablet (20 mg total) by mouth 2 (two) times daily.  . hydrochlorothiazide (HYDRODIURIL) 25 MG tablet Take 1 tablet (25 mg total) by mouth daily.  . hydrOXYzine (ATARAX/VISTARIL) 10 MG tablet Take 1 tablet (10 mg total) by mouth 3 (three) times daily as needed.  . Multiple Vitamins-Calcium (ONE-A-DAY WOMENS PO) Take 1 tablet by mouth daily.  Marland Kitchen OVER THE COUNTER MEDICATION 1 tablet daily as needed. Takes generic stool softener for constipation d/t oxycodone.  Marland Kitchen oxyCODONE-acetaminophen (PERCOCET/ROXICET) 5-325 MG tablet Take 1 tablet by mouth every 4 (four) hours as needed for severe pain.   No facility-administered encounter medications on file as of 12/26/2018.     Surgical History: Past Surgical History:  Procedure Laterality Date  . CESAREAN SECTION  1992   . COLONOSCOPY     polyps removed first procedure. 2nd time all was clear  . FINGER SURGERY Left 2011   left finger cut off x 2.(only up to last digit)  . HEMATOMA EVACUATION Left 1999   upper part of foot was injured d/t 500lb weight landing on her foot.   Marland Kitchen LAPAROSCOPIC SALPINGO OOPHERECTOMY Left 04/02/2018   Procedure: LAPAROSCOPIC SALPINGO OOPHORECTOMY;  Surgeon: Malachy Mood, MD;  Location: [facility] ORS;  Service: Gynecology;  Laterality: Left;    Medical History: Past Medical History:  Diagnosis Date  . Anginal pain (HCC)    tightness related to anxiety  . Anxiety   . Depression   . GERD (gastroesophageal reflux disease)    throws up easily but not diagnosed with reflux  . History of C-section 71  . Hypertension   . Sleep apnea    uses cpap    Family History: Family History  Problem Relation Age of Onset  . Breast cancer Mother 40       x 3 times  . Colon cancer Mother   . Brain cancer Mother   . Cancer - Colon Mother   . Cancer - Other Mother   . Heart Problems Father   . Colon cancer Brother   . Heart Problems Brother     Social History   Socioeconomic History  . Marital status: Divorced    Spouse name: Not on file  .  Number of children: 1  . Years of education: Not on file  . Highest education level: Not on file  Occupational History  . Occupation: works in Secretary/administrator  Social Needs  . Financial resource strain: Not on file  . Food insecurity    Worry: Not on file    Inability: Not on file  . Transportation needs    Medical: Not on file    Non-medical: Not on file  Tobacco Use  . Smoking status: Never Smoker  . Smokeless tobacco: Never Used  Substance and Sexual Activity  . Alcohol use: No    Frequency: Never  . Drug use: No  . Sexual activity: Not Currently    Birth control/protection: Post-menopausal  Lifestyle  . Physical activity    Days per week: Not on file    Minutes per session: Not on file  . Stress: Not on file   Relationships  . Social Herbalist on phone: Not on file    Gets together: Not on file    Attends religious service: Not on file    Active member of club or organization: Not on file    Attends meetings of clubs or organizations: Not on file    Relationship status: Not on file  . Intimate partner violence    Fear of current or ex partner: Not on file    Emotionally abused: Not on file    Physically abused: Not on file    Forced sexual activity: Not on file  Other Topics Concern  . Not on file  Social History Narrative   Son has schizophrenia and autism but is fully capable of helping mother after surgery      Review of Systems  Constitutional: Negative for chills, fatigue and unexpected weight change.  HENT: Negative for congestion, rhinorrhea, sneezing and sore throat.   Eyes: Negative for photophobia, pain and redness.  Respiratory: Negative for cough, chest tightness and shortness of breath.   Cardiovascular: Negative for chest pain and palpitations.  Gastrointestinal: Negative for abdominal pain, constipation, diarrhea, nausea and vomiting.  Endocrine: Negative.   Genitourinary: Negative for dysuria and frequency.  Musculoskeletal: Negative for arthralgias, back pain, joint swelling and neck pain.  Skin: Negative for rash.  Allergic/Immunologic: Negative.   Neurological: Negative for tremors and numbness.  Hematological: Negative for adenopathy. Does not bruise/bleed easily.  Psychiatric/Behavioral: Negative for behavioral problems and sleep disturbance. The patient is not nervous/anxious.     Vital Signs: BP 139/68   Pulse 76   Temp 98.2 F (36.8 C)   Resp 16   Ht 5\' 9"  (1.753 m)   Wt (!) 340 lb (154.2 kg)   SpO2 98%   BMI 50.21 kg/m    Physical Exam Vitals signs and nursing note reviewed.  Constitutional:      General: She is not in acute distress.    Appearance: She is well-developed. She is not diaphoretic.  HENT:     Head: Normocephalic  and atraumatic.     Mouth/Throat:     Pharynx: No oropharyngeal exudate.  Eyes:     Pupils: Pupils are equal, round, and reactive to light.  Neck:     Musculoskeletal: Normal range of motion and neck supple.     Thyroid: No thyromegaly.     Vascular: No JVD.     Trachea: No tracheal deviation.  Cardiovascular:     Rate and Rhythm: Normal rate and regular rhythm.     Heart sounds: Normal heart  sounds. No murmur. No friction rub. No gallop.   Pulmonary:     Effort: Pulmonary effort is normal. No respiratory distress.     Breath sounds: Normal breath sounds. No wheezing or rales.  Chest:     Chest wall: No tenderness.  Abdominal:     Palpations: Abdomen is soft.     Tenderness: There is no abdominal tenderness. There is no guarding.  Musculoskeletal: Normal range of motion.  Lymphadenopathy:     Cervical: No cervical adenopathy.  Skin:    General: Skin is warm and dry.  Neurological:     Mental Status: She is alert and oriented to person, place, and time.     Cranial Nerves: No cranial nerve deficit.  Psychiatric:        Behavior: Behavior normal.        Thought Content: Thought content normal.        Judgment: Judgment normal.    Assessment/Plan: 1. Chest pain, unspecified type Normal EKG - EKG 12-Lead - EKG 12-Lead  2. Encounter for long-term (current) use of medications Negative UDS. - POCT Urine Drug Screen  3. Dysuria Urine dip clear. - POCT Urinalysis Dipstick  4. OSA on CPAP PT reports she is using cpap.  Once again discussed importance of compliance.   5. Essential hypertension Stable, continue with present management.  6. Morbid obesity ([place]) Obesity Counseling: Risk Assessment: An assessment of behavioral risk factors was made today and includes lack of exercise sedentary lifestyle, lack of portion control and poor dietary habits.  Risk Modification Advice: She was counseled on portion control guidelines. Restricting daily caloric intake to. . The  detrimental long term effects of obesity on her health and ongoing poor compliance was also discussed with the patient.  7. GAD (generalized anxiety disorder) Her anxiety is elevated, and this is likely why she is having chest pain.  She once again is asking for this provider to prescribe oxycodone for her so she can work.  I declined to do so at this time, and encouraged her to continue her current medications and follow up with pain management as scheduled.   8. Low back pain with sciatica, sciatica laterality unspecified, unspecified back pain laterality, unspecified chronicity Continue complaints of low back pain. Encouraged patient to keep appt with pain management.   General Counseling: Tawn verbalizes understanding of the findings of todays visit and agrees with plan of treatment. I have discussed any further diagnostic evaluation that may be needed or ordered today. We also reviewed her medications today. she has been encouraged to call the office with any questions or concerns that should arise related to todays visit.    Orders Placed This Encounter  Procedures  . POCT Urine Drug Screen  . POCT Urinalysis Dipstick  . EKG 12-Lead  . EKG 12-Lead    No orders of the defined types were placed in this encounter.   Time spent: 20 Minutes   This patient was seen by Orson Gear AGNP-C in Collaboration with Dr Lavera Guise as a part of collaborative care agreement     Kendell Bane AGNP-C Internal medicine

## 2018-12-26 NOTE — Telephone Encounter (Signed)
done

## 2018-12-30 ENCOUNTER — Telehealth: Payer: Self-pay

## 2018-12-30 ENCOUNTER — Telehealth: Payer: Self-pay | Admitting: Student in an Organized Health Care Education/Training Program

## 2018-12-30 NOTE — Telephone Encounter (Signed)
Pt called stating she would like to know if Dr Holley Raring could prescribe her some pain medication to last her through until she gets her procedure next week

## 2018-12-30 NOTE — Telephone Encounter (Signed)
Patient called back and states that her PCP would not write her any medication. Patient is requesting Oxycontin 10mg  2po Q4 hours until her procedure appointment. I  Told patient again that Dr Holley Raring usually does not prescribe medications without an appt. What are your thoughts. I will be glad to call her back and inform her.

## 2018-12-30 NOTE — Telephone Encounter (Signed)
Talked to patient instructed to call her PCP since she is not in a contract with [place]. Patient only wants medications for a few days until her procedure visit. She stated that she only takes when she doesn't work.

## 2018-12-30 NOTE — Telephone Encounter (Signed)
According my previous note, she's not a candidate for chronic opioid therapy. I've never prescribed her opioid medications and prefer not to given hx of anxiety, dependence on Xanax, and morbid obesity with OSA.  Can discuss further at her next visit but I will not be prescribing Oxycodone at the time being.

## 2018-12-31 NOTE — Telephone Encounter (Signed)
Called and left message on AM that Dr Holley Raring will not be prescribing medications at this time. Informed her that he will discuss this with her at her next visit.

## 2019-01-03 ENCOUNTER — Other Ambulatory Visit: Payer: Self-pay

## 2019-01-03 ENCOUNTER — Other Ambulatory Visit
Admission: RE | Admit: 2019-01-03 | Discharge: 2019-01-03 | Disposition: A | Discharge status: Home / Self Care | Payer: BC Managed Care – PPO | Source: Ambulatory Visit | Attending: Student in an Organized Health Care Education/Training Program | Admitting: Student in an Organized Health Care Education/Training Program

## 2019-01-03 DIAGNOSIS — Z01812 Encounter for preprocedural laboratory examination: Secondary | ICD-10-CM | POA: Insufficient documentation

## 2019-01-03 DIAGNOSIS — Z1159 Encounter for screening for other viral diseases: Secondary | ICD-10-CM | POA: Insufficient documentation

## 2019-01-04 LAB — SARS CORONAVIRUS 2 (TAT 6-24 HRS): SARS Coronavirus 2: NEGATIVE

## 2019-01-06 ENCOUNTER — Ambulatory Visit: Payer: BC Managed Care – PPO | Admitting: Student in an Organized Health Care Education/Training Program

## 2019-01-08 ENCOUNTER — Ambulatory Visit (HOSPITAL_BASED_OUTPATIENT_CLINIC_OR_DEPARTMENT_OTHER): Payer: BC Managed Care – PPO | Admitting: Student in an Organized Health Care Education/Training Program

## 2019-01-08 ENCOUNTER — Other Ambulatory Visit: Payer: Self-pay

## 2019-01-08 ENCOUNTER — Ambulatory Visit
Admission: RE | Admit: 2019-01-08 | Discharge: 2019-01-08 | Disposition: A | Discharge status: Home / Self Care | Payer: BC Managed Care – PPO | Source: Ambulatory Visit | Attending: Student in an Organized Health Care Education/Training Program | Admitting: Student in an Organized Health Care Education/Training Program

## 2019-01-08 ENCOUNTER — Encounter: Payer: Self-pay | Admitting: Student in an Organized Health Care Education/Training Program

## 2019-01-08 DIAGNOSIS — M5440 Lumbago with sciatica, unspecified side: Secondary | ICD-10-CM | POA: Insufficient documentation

## 2019-01-08 DIAGNOSIS — M47816 Spondylosis without myelopathy or radiculopathy, lumbar region: Secondary | ICD-10-CM | POA: Insufficient documentation

## 2019-01-08 MED ORDER — FENTANYL CITRATE (PF) 100 MCG/2ML IJ SOLN
INTRAMUSCULAR | Status: AC
  Filled 2019-01-08: qty 2

## 2019-01-08 MED ORDER — ROPIVACAINE HCL 2 MG/ML IJ SOLN
2.0000 mL | Freq: Once | INTRAMUSCULAR | Status: AC
  Administered 2019-01-08: 10:00:00 2 mL via EPIDURAL

## 2019-01-08 MED ORDER — ROPIVACAINE HCL 2 MG/ML IJ SOLN
1.0000 mL | Freq: Once | INTRAMUSCULAR | Status: AC
  Administered 2019-01-08: 1 mL via EPIDURAL

## 2019-01-08 MED ORDER — FENTANYL CITRATE (PF) 100 MCG/2ML IJ SOLN
25.0000 ug | INTRAMUSCULAR | Status: DC | PRN
  Administered 2019-01-08: 50 ug via INTRAVENOUS

## 2019-01-08 MED ORDER — OXYCODONE-ACETAMINOPHEN 5-325 MG PO TABS: 1.0000 | ORAL_TABLET | Freq: Every day | ORAL | 0 refills | Status: DC | PRN

## 2019-01-08 MED ORDER — ROPIVACAINE HCL 2 MG/ML IJ SOLN
INTRAMUSCULAR | Status: AC
  Filled 2019-01-08: qty 10

## 2019-01-08 MED ORDER — DEXAMETHASONE SODIUM PHOSPHATE 10 MG/ML IJ SOLN
10.0000 mg | Freq: Once | INTRAMUSCULAR | Status: AC
  Administered 2019-01-08: 10 mg

## 2019-01-08 MED ORDER — LIDOCAINE HCL 2 % IJ SOLN
20.0000 mL | Freq: Once | INTRAMUSCULAR | Status: AC
  Administered 2019-01-08: 400 mg

## 2019-01-08 MED ORDER — LIDOCAINE HCL 2 % IJ SOLN
INTRAMUSCULAR | Status: AC
  Filled 2019-01-08: qty 20

## 2019-01-08 MED ORDER — DEXAMETHASONE SODIUM PHOSPHATE 10 MG/ML IJ SOLN
INTRAMUSCULAR | Status: AC
  Filled 2019-01-08: qty 1

## 2019-01-08 NOTE — Progress Notes (Signed)
Safety precautions to be maintained throughout the outpatient stay will include: orient to surroundings, keep bed in low position, maintain call bell within reach at all times, provide assistance with transfer out of bed and ambulation.  

## 2019-01-08 NOTE — Progress Notes (Signed)
Patient's Name: Candace Clayton  MRN: [id number]  Referring Provider: Gillis Santa, MD  DOB: [birth year]  PCP: Ronnell Freshwater, NP  DOS: 01/08/2019  Note by: Gillis Santa, MD  Service setting: Ambulatory outpatient  Specialty: Interventional Pain Management  Patient type: Established  Location: [facility] (AMB) Pain Management Facility  Visit type: Interventional Procedure   Primary Reason for Visit: Interventional Pain Management Treatment. CC: Back Pain  Procedure:          Anesthesia, Analgesia, Anxiolysis:  Type: Lumbar Facet, Medial Branch Block(s) #1  Primary Purpose: Diagnostic Region: Posterolateral Lumbosacral Spine Level:  L3, L4, L5, & S1 Medial Branch Level(s). Injecting these levels blocks the L3-4, L4-5, and L5-S1 lumbar facet joints. Laterality: Bilateral   (s/p R L3,4,5,S1 06/2018)  Type: Moderate (Conscious) Sedation combined with Local Anesthesia Indication(s): Analgesia and Anxiety Route: Intravenous (IV) IV Access: Secured Sedation: Meaningful verbal contact was maintained at all times during the procedure  Local Anesthetic: Lidocaine 1-2%  Position: Prone   Indications: 1. Lumbar facet arthropathy (R>L)   2. Low back pain with sciatica, sciatica laterality unspecified, unspecified back pain laterality, unspecified chronicity    Pain Score: Pre-procedure: 7 /10 Post-procedure: 0-No pain/10  Pre-op Assessment:  Candace Clayton is a 58 y.o. (year old), female patient, seen today for interventional treatment. She  has a past surgical history that includes Cesarean section (1992); Finger surgery (Left, 2011); Hematoma evacuation (Left, 1999); Colonoscopy; and Laparoscopic salpingo oophorectomy (Left, 04/02/2018). Candace Clayton has a current medication list which includes the following prescription(s): buspirone, cetirizine, desvenlafaxine, enalapril, hydrochlorothiazide, hydroxyzine, multiple vitamins-calcium, OVER THE COUNTER MEDICATION, oxycodone-acetaminophen, and  oxycodone-acetaminophen, and the following Facility-Administered Medications: fentanyl. Her primarily concern today is the Back Pain  Initial Vital Signs:  Pulse/HCG Rate: 79ECG Heart Rate: 71 Temp: 97.8 F (36.6 C) Resp: 19 BP: (!) 152/81 SpO2: 99 %  BMI: Estimated body mass index is 50.21 kg/m as calculated from the following:   Height as of this encounter: 5\' 9"  (1.753 m).   Weight as of this encounter: 340 lb (154.2 kg).  Risk Assessment: Allergies: Reviewed. She is allergic to gabapentin.  Allergy Precautions: None required Coagulopathies: Reviewed. None identified.  Blood-thinner therapy: None at this time Active Infection(s): Reviewed. None identified. Candace Clayton is afebrile  Site Confirmation: Candace Clayton was asked to confirm the procedure and laterality before marking the site Procedure checklist: Completed Consent: Before the procedure and under the influence of no sedative(s), amnesic(s), or anxiolytics, the patient was informed of the treatment options, risks and possible complications. To fulfill our ethical and legal obligations, as recommended by the American Medical Association's Code of Ethics, I have informed the patient of my clinical impression; the nature and purpose of the treatment or procedure; the risks, benefits, and possible complications of the intervention; the alternatives, including doing nothing; the risk(s) and benefit(s) of the alternative treatment(s) or procedure(s); and the risk(s) and benefit(s) of doing nothing. The patient was provided information about the general risks and possible complications associated with the procedure. These may include, but are not limited to: failure to achieve desired goals, infection, bleeding, organ or nerve damage, allergic reactions, paralysis, and death. In addition, the patient was informed of those risks and complications associated to Spine-related procedures, such as failure to decrease pain; infection (i.e.:  Meningitis, epidural or intraspinal abscess); bleeding (i.e.: epidural hematoma, subarachnoid hemorrhage, or any other type of intraspinal or peri-dural bleeding); organ or nerve damage (i.e.: Any type of peripheral nerve, nerve root, or spinal  cord injury) with subsequent damage to sensory, motor, and/or autonomic systems, resulting in permanent pain, numbness, and/or weakness of one or several areas of the body; allergic reactions; (i.e.: anaphylactic reaction); and/or death. Furthermore, the patient was informed of those risks and complications associated with the medications. These include, but are not limited to: allergic reactions (i.e.: anaphylactic or anaphylactoid reaction(s)); adrenal axis suppression; blood sugar elevation that in diabetics may result in ketoacidosis or comma; water retention that in patients with history of congestive heart failure may result in shortness of breath, pulmonary edema, and decompensation with resultant heart failure; weight gain; swelling or edema; medication-induced neural toxicity; particulate matter embolism and blood vessel occlusion with resultant organ, and/or nervous system infarction; and/or aseptic necrosis of one or more joints. Finally, the patient was informed that Medicine is not an exact science; therefore, there is also the possibility of unforeseen or unpredictable risks and/or possible complications that may result in a catastrophic outcome. The patient indicated having understood very clearly. We have given the patient no guarantees and we have made no promises. Enough time was given to the patient to ask questions, all of which were answered to the patient's satisfaction. Candace Clayton has indicated that she wanted to continue with the procedure. Attestation: I, the ordering provider, attest that I have discussed with the patient the benefits, risks, side-effects, alternatives, likelihood of achieving goals, and potential problems during recovery for the  procedure that I have provided informed consent. Date   Time: 01/08/2019  9:13 AM  Pre-Procedure Preparation:  Monitoring: As per clinic protocol. Respiration, ETCO2, SpO2, BP, heart rate and rhythm monitor placed and checked for adequate function Safety Precautions: Patient was assessed for positional comfort and pressure points before starting the procedure. Time-out: I initiated and conducted the "Time-out" before starting the procedure, as per protocol. The patient was asked to participate by confirming the accuracy of the "Time Out" information. Verification of the correct Clayton, site, and procedure were performed and confirmed by me, the nursing staff, and the patient. "Time-out" conducted as per Joint Commission's Universal Protocol (UP.01.01.01). Time: 1003  Description of Procedure:          Laterality: Bilateral. The procedure was performed in identical fashion on both sides. Levels:  L3, L4, L5, & S1 Medial Branch Level(s) Area Prepped: Posterior Lumbosacral Region Prepping solution: ChloraPrep (2% chlorhexidine gluconate and 70% isopropyl alcohol) Safety Precautions: Aspiration looking for blood return was conducted prior to all injections. At no point did we inject any substances, as a needle was being advanced. Before injecting, the patient was told to immediately notify me if she was experiencing any new onset of "ringing in the ears, or metallic taste in the mouth". No attempts were made at seeking any paresthesias. Safe injection practices and needle disposal techniques used. Medications properly checked for expiration dates. SDV (single dose vial) medications used. After the completion of the procedure, all disposable equipment used was discarded in the proper designated medical waste containers. Local Anesthesia: Protocol guidelines were followed. The patient was positioned over the fluoroscopy table. The area was prepped in the usual manner. The time-out was completed. The target  area was identified using fluoroscopy. A 12-in long, straight, sterile hemostat was used with fluoroscopic guidance to locate the targets for each level blocked. Once located, the skin was marked with an approved surgical skin marker. Once all sites were marked, the skin (epidermis, dermis, and hypodermis), as well as deeper tissues (fat, connective tissue and muscle) were infiltrated with  a small amount of a short-acting local anesthetic, loaded on a 10cc syringe with a 25G, 1.5-in  Needle. An appropriate amount of time was allowed for local anesthetics to take effect before proceeding to the next step. Local Anesthetic: Lidocaine 2.0% The unused portion of the local anesthetic was discarded in the proper designated containers. Technical explanation of process:  L3 Medial Branch Nerve Block (MBB): The target area for the L3 medial branch is at the junction of the postero-lateral aspect of the superior articular process and the superior, posterior, and medial edge of the transverse process of L4. Under fluoroscopic guidance, a Quincke needle was inserted until contact was made with os over the superior postero-lateral aspect of the pedicular shadow (target area). After negative aspiration for blood, 1 mL of the nerve block solution was injected without difficulty or complication. The needle was removed intact. L4 Medial Branch Nerve Block (MBB): The target area for the L4 medial branch is at the junction of the postero-lateral aspect of the superior articular process and the superior, posterior, and medial edge of the transverse process of L5. Under fluoroscopic guidance, a Quincke needle was inserted until contact was made with os over the superior postero-lateral aspect of the pedicular shadow (target area). After negative aspiration for blood,1 mL of the nerve block solution was injected without difficulty or complication. The needle was removed intact. L5 Medial Branch Nerve Block (MBB): The target area for  the L5 medial branch is at the junction of the postero-lateral aspect of the superior articular process and the superior, posterior, and medial edge of the sacral ala. Under fluoroscopic guidance, a Quincke needle was inserted until contact was made with os over the superior postero-lateral aspect of the pedicular shadow (target area). After negative aspiration for blood, 58mL of the nerve block solution was injected without difficulty or complication. The needle was removed intact. S1 Medial Branch Nerve Block (MBB): The target area for the S1 medial branch is at the posterior and inferior 6 o'clock position of the L5-S1 facet joint. Under fluoroscopic guidance, the Quincke needle inserted for the L5 MBB was redirected until contact was made with os over the inferior and postero aspect of the sacrum, at the 6 o' clock position under the L5-S1 facet joint (Target area). After negative aspiration for blood, 51mL of the nerve block solution was injected without difficulty or complication. The needle was removed intact. Procedural Needles: 22-gauge, 3.5-inch, Quincke needles used for all levels. Nerve block solution: 10 cc solution made of 8 cc of 0.2% ropivacaine, 2 cc of Decadron 10 mg/cc. 1-1.5 cc injected at each level above bilaterally. The unused portion of the solution was discarded in the proper designated containers.  Once the entire procedure was completed, the treated area was cleaned, making sure to leave some of the prepping solution back to take advantage of its long term bactericidal properties.   Illustration of the posterior view of the lumbar spine and the posterior neural structures. Laminae of L2 through S1 are labeled. DPRL5, dorsal primary ramus of L5; DPRS1, dorsal primary ramus of S1; DPR3, dorsal primary ramus of L3; FJ, facet (zygapophyseal) joint L3-L4; I, inferior articular process of L4; LB1, lateral branch of dorsal primary ramus of L1; IAB, inferior articular branches from L3 medial  branch (supplies L4-L5 facet joint); IBP, intermediate branch plexus; MB3, medial branch of dorsal primary ramus of L3; NR3, third lumbar nerve root; S, superior articular process of L5; SAB, superior articular branches from L4 (supplies L4-5  facet joint also); TP3, transverse process of L3.  Vitals:   01/08/19 1030 01/08/19 1037 01/08/19 1047 01/08/19 1057  BP: (!) 143/79 (!) 143/78 (!) 141/69 (!) 141/93  Pulse: 72     Resp: 20 18 20 18   Temp:  98.2 F (36.8 C)    SpO2: 98% 99% 97% 97%  Weight:      Height:         Start Time: 1003 hrs. End Time: 1028 hrs.  Imaging Guidance (Spinal):          Type of Imaging Technique: Fluoroscopy Guidance (Spinal) Indication(s): Assistance in needle guidance and placement for procedures requiring needle placement in or near specific anatomical locations not easily accessible without such assistance. Exposure Time: Please see nurses notes. Contrast: None used. Fluoroscopic Guidance: I was personally present during the use of fluoroscopy. "Tunnel Vision Technique" used to obtain the best possible view of the target area. Parallax error corrected before commencing the procedure. "Direction-depth-direction" technique used to introduce the needle under continuous pulsed fluoroscopy. Once target was reached, antero-posterior, oblique, and lateral fluoroscopic projection used confirm needle placement in all planes. Images permanently stored in EMR. Interpretation: No contrast injected. I personally interpreted the imaging intraoperatively. Adequate needle placement confirmed in multiple planes. Permanent images saved into the patient's record.  Antibiotic Prophylaxis:   Anti-infectives (From admission, onward)   None     Indication(s): None identified  Post-operative Assessment:  Post-procedure Vital Signs:  Pulse/HCG Rate: 7270 Temp: 98.2 F (36.8 C) Resp: 18 BP: (!) 141/93 SpO2: 97 %  EBL: None  Complications: No immediate post-treatment  complications observed by team, or reported by patient.  Note: The patient tolerated the entire procedure well. A repeat set of vitals were taken after the procedure and the patient was kept under observation following institutional policy, for this type of procedure. Post-procedural neurological assessment was performed, showing return to baseline, prior to discharge. The patient was provided with post-procedure discharge instructions, including a section on how to identify potential problems. Should any problems arise concerning this procedure, the patient was given instructions to immediately contact us, at any time, without hesitation. In any case, we plan to contact the patient by telephone for a follow-up status report regarding this interventional procedure.  Comments:  No additional relevant information.  Plan of Care   Imaging Orders     DG PAIN [facility]IN[facility] C-ARM 1-60 MIN NO REPORT Procedure Orders    No procedure(s) ordered today   Patient signed pain contract, UDS appropriate. Patient stopped Xanax. PMP checked and reviewed.  Medications ordered for procedure: Meds ordered this encounter  Medications   lidocaine (XYLOCAINE) 2 % (with pres) injection 400 mg   fentaNYL (SUBLIMAZE) injection 25-50 mcg    Make sure Narcan is available in the pyxis when using this medication. In the event of respiratory depression (RR< 8/min): Titrate NARCAN (naloxone) in increments of 0.1 to 0.2 mg IV at 2-3 minute intervals, until desired degree of reversal.   ropivacaine (PF) 2 mg/mL (0.2%) (NAROPIN) injection 1 mL   dexamethasone (DECADRON) injection 10 mg   ropivacaine (PF) 2 mg/mL (0.2%) (NAROPIN) injection 2 mL   dexamethasone (DECADRON) injection 10 mg   oxyCODONE-acetaminophen (PERCOCET/ROXICET) 5-325 MG tablet    Sig: Take 1-2 tablets by mouth daily as needed for severe pain. For chronic pain, max 35/month    Dispense:  35 tablet    Refill:  0   oxyCODONE-acetaminophen  (PERCOCET/ROXICET) 5-325 MG tablet    Sig: Take 1-2  tablets by mouth daily as needed for severe pain. For chronic pain, max 35/month    Dispense:  35 tablet    Refill:  0   Medications administered: We administered lidocaine, fentaNYL, ropivacaine (PF) 2 mg/mL (0.2%), dexamethasone, ropivacaine (PF) 2 mg/mL (0.2%), and dexamethasone.  See the medical record for exact dosing, route, and time of administration.  Disposition: Discharge home  Discharge Date & Time: 01/08/2019; 1057 hrs.   Physician-requested Follow-up: Return in about 8 weeks (around 03/05/2019) for Medication Management, Post Procedure Evaluation, virtual.  Future Appointments  Date Time Provider Ali Chuk  01/23/2019 10:30 AM Allyne Gee, MD [facility] None  01/24/2019  3:15 PM Ronnell Freshwater, NP [facility] None  03/05/2019  1:30 PM Gillis Santa, MD [facility] None  04/14/2019  3:45 PM Ronnell Freshwater, NP [facility] None   Primary Care Physician: Ronnell Freshwater, NP Location: [facility] Note by: Gillis Santa, MD Date: 01/08/2019; Time: 12:30 PM  Disclaimer:  Medicine is not an exact science. The only guarantee in medicine is that nothing is guaranteed. It is important to note that the decision to proceed with this intervention was based on the information collected from the patient. The Data and conclusions were drawn from the patient's questionnaire, the interview, and the physical examination. Because the information was provided in large part by the patient, it cannot be guaranteed that it has not been purposely or unconsciously manipulated. Every effort has been made to obtain as much relevant data as possible for this evaluation. It is important to note that the conclusions that lead to this procedure are derived in large part from the available data. Always take into account that the treatment will also be dependent on availability of resources and existing treatment guidelines,  considered by other Pain Management Practitioners as being common knowledge and practice, at the time of the intervention. For Medico-Legal purposes, it is also important to point out that variation in procedural techniques and pharmacological choices are the acceptable norm. The indications, contraindications, technique, and results of the above procedure should only be interpreted and judged by a Board-Certified Interventional Pain Specialist with extensive familiarity and expertise in the same exact procedure and technique.

## 2019-01-08 NOTE — Patient Instructions (Signed)

## 2019-01-09 ENCOUNTER — Telehealth: Payer: Self-pay | Admitting: *Deleted

## 2019-01-09 NOTE — Telephone Encounter (Signed)
Attempted to call for post procedure follow-up. Message left. 

## 2019-01-16 ENCOUNTER — Other Ambulatory Visit: Payer: Self-pay

## 2019-01-16 MED ORDER — CETIRIZINE HCL 10 MG PO TABS: ORAL_TABLET | ORAL | 4 refills | Status: DC

## 2019-01-20 ENCOUNTER — Ambulatory Visit: Payer: Self-pay | Admitting: Internal Medicine

## 2019-01-23 ENCOUNTER — Ambulatory Visit: Payer: BC Managed Care – PPO | Admitting: Internal Medicine

## 2019-01-23 ENCOUNTER — Encounter: Payer: Self-pay | Admitting: Internal Medicine

## 2019-01-23 ENCOUNTER — Other Ambulatory Visit: Payer: Self-pay | Admitting: Adult Health

## 2019-01-23 ENCOUNTER — Other Ambulatory Visit: Payer: Self-pay

## 2019-01-23 VITALS — BP 136/86 | HR 77 | Resp 16 | Ht 69.0 in | Wt 342.0 lb

## 2019-01-23 DIAGNOSIS — G4733 Obstructive sleep apnea (adult) (pediatric): Secondary | ICD-10-CM

## 2019-01-23 DIAGNOSIS — Z9989 Dependence on other enabling machines and devices: Secondary | ICD-10-CM

## 2019-01-23 DIAGNOSIS — I10 Essential (primary) hypertension: Secondary | ICD-10-CM

## 2019-01-23 DIAGNOSIS — J4520 Mild intermittent asthma, uncomplicated: Secondary | ICD-10-CM

## 2019-01-23 DIAGNOSIS — F411 Generalized anxiety disorder: Secondary | ICD-10-CM

## 2019-01-23 MED ORDER — ALBUTEROL SULFATE HFA 108 (90 BASE) MCG/ACT IN AERS: 2.0000 | INHALATION_SPRAY | Freq: Four times a day (QID) | RESPIRATORY_TRACT | 2 refills | Status: DC | PRN

## 2019-01-23 MED ORDER — BUSPIRONE HCL 10 MG PO TABS: 10.0000 mg | ORAL_TABLET | Freq: Three times a day (TID) | ORAL | 2 refills | Status: DC

## 2019-01-23 NOTE — Progress Notes (Signed)
Refilled buspar at correct dose.

## 2019-01-23 NOTE — Progress Notes (Signed)
[facility] [place], [place] [address]  Pulmonary Sleep Medicine   Office Visit Note  Patient Name: Candace Clayton DOB: 07/10/1960 MRN [id number]  Date of Service: 01/23/2019  Complaints/HPI: Pt is here for pulmonary follow up. Her PFT shows "study consistent with mild obstructive  lung disease and moderate restrictive lung disease". She reports for the last few months she has noticed some intermittent SOB, with some chest pains that are brief. She reports she contributed it to anxiety, but is now concerned it may be her lungs. She is weraing her cpap nightly.    ROS  General: (-) fever, (-) chills, (-) night sweats, (-) weakness Skin: (-) rashes, (-) itching,. Eyes: (-) visual changes, (-) redness, (-) itching. Nose and Sinuses: (-) nasal stuffiness or itchiness, (-) postnasal drip, (-) nosebleeds, (-) sinus trouble. Mouth and Throat: (-) sore throat, (-) hoarseness. Neck: (-) swollen glands, (-) enlarged thyroid, (-) neck pain. Respiratory: - cough, (-) bloody sputum, + shortness of breath, - wheezing. Cardiovascular: - ankle swelling, (-) chest pain. Lymphatic: (-) lymph node enlargement. Neurologic: (-) numbness, (-) tingling. Psychiatric: (-) anxiety, (-) depression   Current Medication: Outpatient Encounter Medications as of 01/23/2019  Medication Sig  . busPIRone (BUSPAR) 5 MG tablet Take 1 tablet (5 mg total) by mouth 3 (three) times daily. Take one tab po bid for one week, then increase to tid for anxiety  . cetirizine (ZYRTEC) 10 MG tablet TAKE 1 TABLET(S) BY MOUTH DAILY FOR ALLERGIES  . desvenlafaxine (PRISTIQ) 100 MG 24 hr tablet TAKE 1 TABLET BY MOUTH EVERY DAY  . enalapril (VASOTEC) 20 MG tablet Take 1 tablet (20 mg total) by mouth 2 (two) times daily.  . hydrochlorothiazide (HYDRODIURIL) 25 MG tablet Take 1 tablet (25 mg total) by mouth daily.  . hydrOXYzine (ATARAX/VISTARIL) 10 MG tablet Take 1 tablet (10 mg total) by mouth 3 (three)  times daily as needed.  . Multiple Vitamins-Calcium (ONE-A-DAY WOMENS PO) Take 1 tablet by mouth daily.  . NON FORMULARY cpap device  . OVER THE COUNTER MEDICATION 1 tablet daily as needed. Takes generic stool softener for constipation d/t oxycodone.  Marland Kitchen oxyCODONE-acetaminophen (PERCOCET/ROXICET) 5-325 MG tablet Take 1-2 tablets by mouth daily as needed for severe pain. For chronic pain, max 35/month  . [START ON 02/07/2019] oxyCODONE-acetaminophen (PERCOCET/ROXICET) 5-325 MG tablet Take 1-2 tablets by mouth daily as needed for severe pain. For chronic pain, max 35/month   No facility-administered encounter medications on file as of 01/23/2019.     Surgical History: Past Surgical History:  Procedure Laterality Date  . CESAREAN SECTION  1992  . COLONOSCOPY     polyps removed first procedure. 2nd time all was clear  . FINGER SURGERY Left 2011   left finger cut off x 2.(only up to last digit)  . HEMATOMA EVACUATION Left 1999   upper part of foot was injured d/t 500lb weight landing on her foot.   Marland Kitchen LAPAROSCOPIC SALPINGO OOPHERECTOMY Left 04/02/2018   Procedure: LAPAROSCOPIC SALPINGO OOPHORECTOMY;  Surgeon: Malachy Mood, MD;  Location: [facility] ORS;  Service: Gynecology;  Laterality: Left;    Medical History: Past Medical History:  Diagnosis Date  . Anginal pain (HCC)    tightness related to anxiety  . Anxiety   . Depression   . GERD (gastroesophageal reflux disease)    throws up easily but not diagnosed with reflux  . History of C-section 21  . Hypertension   . Sleep apnea    uses cpap  Family History: Family History  Problem Relation Age of Onset  . Breast cancer Mother 40       x 3 times  . Colon cancer Mother   . Brain cancer Mother   . Cancer - Colon Mother   . Cancer - Other Mother   . Heart Problems Father   . Colon cancer Brother   . Heart Problems Brother     Social History: Social History   Socioeconomic History  . Marital status: Divorced    Spouse  name: Not on file  . Number of children: 1  . Years of education: Not on file  . Highest education level: Not on file  Occupational History  . Occupation: works in Secretary/administrator  Social Needs  . Financial resource strain: Not on file  . Food insecurity    Worry: Not on file    Inability: Not on file  . Transportation needs    Medical: Not on file    Non-medical: Not on file  Tobacco Use  . Smoking status: Never Smoker  . Smokeless tobacco: Never Used  Substance and Sexual Activity  . Alcohol use: No    Frequency: Never  . Drug use: No  . Sexual activity: Not Currently    Birth control/protection: Post-menopausal  Lifestyle  . Physical activity    Days per week: Not on file    Minutes per session: Not on file  . Stress: Not on file  Relationships  . Social Herbalist on phone: Not on file    Gets together: Not on file    Attends religious service: Not on file    Active member of club or organization: Not on file    Attends meetings of clubs or organizations: Not on file    Relationship status: Not on file  . Intimate partner violence    Fear of current or ex partner: Not on file    Emotionally abused: Not on file    Physically abused: Not on file    Forced sexual activity: Not on file  Other Topics Concern  . Not on file  Social History Narrative   Son has schizophrenia and autism but is fully capable of helping mother after surgery    Vital Signs: Blood pressure 136/86, pulse 77, resp. rate 16, height 5\' 9"  (1.753 m), weight (!) 342 lb (155.1 kg), SpO2 99 %.  Examination: General Appearance: The patient is well-developed, well-nourished, and in no distress. Skin: Gross inspection of skin unremarkable. Head: normocephalic, no gross deformities. Eyes: no gross deformities noted. ENT: ears appear grossly normal no exudates. Neck: Supple. No thyromegaly. No LAD. Respiratory: clear bilaterally. Cardiovascular: Normal S1 and S2 without murmur or  rub. Extremities: No cyanosis. pulses are equal. Neurologic: Alert and oriented. No involuntary movements.  LABS: Recent Results (from the past 2160 hour(s))  POCT Urine Drug Screen     Status: None   Collection Time: 12/26/18 10:08 AM  Result Value Ref Range   POC METHAMPHETAMINE UR None Detected None Detected   POC Opiate Ur None Detected None Detected   POC Barbiturate UR None Detected None Detected   POC Amphetamine UR None Detected None Detected   POC Oxycodone UR None Detected None Detected   POC Cocaine UR None Detected None Detected   POC Ecstasy UR None Detected None Detected   POC TRICYCLICS UR None Detected None Detected   POC PHENCYCLIDINE UR None Detected None Detected   POC MARIJUANA UR None Detected  None Detected   POC METHADONE UR None Detected None Detected   POC BENZODIAZEPINES UR None Detected None Detected   URINE TEMPERATURE     POC DRUG SCREEN OXIDANTS URINE     POC SPECIFIC GRAVITY URINE     POC PH URINE     Methylenedioxyamphetamine    POCT Urinalysis Dipstick     Status: Abnormal   Collection Time: 12/26/18 10:08 AM  Result Value Ref Range   Color, UA     Clarity, UA     Glucose, UA Positive (A) Negative   Bilirubin, UA negative    Ketones, UA trace    Spec Grav, UA 1.020 1.010 - 1.025   Blood, UA negative    pH, UA 5.0 5.0 - 8.0   Protein, UA Negative Negative   Urobilinogen, UA 0.2 0.2 or 1.0 E.U./dL   Nitrite, UA negative    Leukocytes, UA Negative Negative   Appearance     Odor    SARS Coronavirus 2 (Performed in [place] hospital lab)     Status: None   Collection Time: 01/03/19 12:57 PM   Specimen: Nasal Swab  Result Value Ref Range   SARS Coronavirus 2 NEGATIVE NEGATIVE    Comment: (NOTE) SARS-CoV-2 target nucleic acids are NOT DETECTED. The SARS-CoV-2 RNA is generally detectable in upper and lower respiratory specimens during the acute phase of infection. Negative results do not preclude SARS-CoV-2 infection, do not rule  out co-infections with other pathogens, and should not be used as the sole basis for treatment or other patient management decisions. Negative results must be combined with clinical observations, patient history, and epidemiological information. The expected result is Negative. Fact Sheet for Patients: SugarRoll.be Fact Sheet for Healthcare Providers: https://www.woods-mathews.com/ This test is not yet approved or cleared by the [place] FDA and  has been authorized for detection and/or diagnosis of SARS-CoV-2 by FDA under an Emergency Use Authorization (EUA). This EUA will remain  in effect (meaning this test can be used) for the duration of the COVID-19 declaration under Section 56 4(b)(1) of the Act, 21 U.S.C. section 360bbb-3(b)(1), unless the authorization is terminated or revoked sooner. Performed at [facility], [place] [address], [place], [place] [address]     Radiology: [facility] C-arm 1-60 Min No Report  Result Date: 01/08/2019 Fluoro was used, but no Radiologist interpretation will be provided. Please refer to "NOTES" tab for provider progress note.   No results found.  [facility] C-arm 1-60 Min No Report  Result Date: 01/08/2019 Fluoro was used, but no Radiologist interpretation will be provided. Please refer to "NOTES" tab for provider progress note.     Assessment and Plan: Patient Active Problem List   Diagnosis Date Noted  . Seasonal allergic rhinitis due to pollen 08/26/2018  . Lumbar facet arthropathy (R>L) 08/22/2018  . Lumbar spondylosis 08/22/2018  . Left ovarian cyst 03/03/2018  . Lumbar degenerative disc disease 12/23/2017  . Screening for breast cancer 10/24/2017  . Low back pain with sciatica 07/03/2017  . Allergic rhinitis, unspecified 07/03/2017  . Pain in unspecified knee 07/03/2017  . Obstructive sleep apnea, adult 07/03/2017  . Sedative, hypnotic, or anxiolytic use, unspecified,  uncomplicated 92/04/9416  . Hypersomnia 06/27/2017  . Major depressive disorder, recurrent episode, moderate ([place]) 06/27/2017  . Essential hypertension 06/27/2017  . Morbid obesity ([place]) 06/27/2017  . GAD (generalized anxiety disorder) 06/27/2017  . Urinary tract infection 06/27/2017   1. OSA on CPAP Continue to wear cpap  as directed.   2. Mild intermittent asthma without complication Mild obstructive lung disease. Will do trial of albuterol, and consider daily medication if successful.   3. Essential hypertension Stable, continue to follow pcp recomendation  4. Morbid obesity (HCC) Obesity Counseling: Risk Assessment: An assessment of behavioral risk factors was made today and includes lack of exercise sedentary lifestyle, lack of portion control and poor dietary habits.  Risk Modification Advice: She was counseled on portion control guidelines. Restricting daily caloric intake to. . The detrimental long term effects of obesity on her health and ongoing poor compliance was also discussed with the patient.   5. GAD (generalized anxiety disorder) PT has a lot of anxeity, reassured her that her lungs are ok.  She needs to continue to take her medications as directed and follow up with [place], and psych as scheduled.    General Counseling: I have discussed the findings of the evaluation and examination with Candace Clayton.  I have also discussed any further diagnostic evaluation thatmay be needed or ordered today. Candace Clayton verbalizes understanding of the findings of todays visit. We also reviewed her medications today and discussed drug interactions and side effects including but not limited excessive drowsiness and altered mental states. We also discussed that there is always a risk not just to her but also people around her. she has been encouraged to call the office with any questions or concerns that should arise related to todays visit.    Time spent: 20 This patient was seen by Orson Gear  AGNP-C in Collaboration with Dr. Devona Konig as a part of collaborative care agreement.   I have personally obtained a history, examined the patient, evaluated laboratory and imaging results, formulated the assessment and plan and placed orders.    Allyne Gee, MD [facility] Pulmonary and Critical Care Sleep medicine

## 2019-01-24 ENCOUNTER — Ambulatory Visit: Payer: Self-pay | Admitting: Nurse Practitioner

## 2019-01-28 ENCOUNTER — Telehealth: Payer: Self-pay

## 2019-01-28 NOTE — Telephone Encounter (Signed)
She is going to run out of medicine by Monday. She has to work Wednesday and Thursday and cant take a phone call. She wants to know if you will call out enough medicine to last her till her 03/05/19 appointment. She states she has to take the medicine because the shots didn't take away the pain.

## 2019-01-28 NOTE — Telephone Encounter (Signed)
Spoke with patient and she is needing additional pain medications.  Medications were filled on 01/07/19 for a qty of 35 with instructions to take 1 - 2 tablets/day on the days that she works.  She states that she has 6 tablets left and is unable to make an earlier appt.  Dr Holley Raring is aware and would like patient to make the remainder of medication last until next appt time.  Supplementing with Ibuprofen if tolerated or Aleve was suggested.  Patient will keep appt for august 14 for medication management.

## 2019-02-01 ENCOUNTER — Other Ambulatory Visit: Payer: Self-pay | Admitting: Internal Medicine

## 2019-02-10 ENCOUNTER — Other Ambulatory Visit: Payer: Self-pay | Admitting: Nurse Practitioner

## 2019-02-10 MED ORDER — BUSPIRONE HCL 10 MG PO TABS: 10.0000 mg | ORAL_TABLET | Freq: Three times a day (TID) | ORAL | 2 refills | Status: DC

## 2019-02-18 ENCOUNTER — Ambulatory Visit: Payer: Self-pay | Admitting: Nurse Practitioner

## 2019-02-27 ENCOUNTER — Ambulatory Visit: Payer: Self-pay | Admitting: Nurse Practitioner

## 2019-02-28 ENCOUNTER — Ambulatory Visit: Payer: BC Managed Care – PPO | Admitting: Nurse Practitioner

## 2019-02-28 ENCOUNTER — Other Ambulatory Visit: Payer: Self-pay

## 2019-02-28 VITALS — BP 138/74 | HR 75 | Resp 16 | Ht 69.0 in | Wt 348.0 lb

## 2019-02-28 DIAGNOSIS — R1031 Right lower quadrant pain: Secondary | ICD-10-CM | POA: Diagnosis not present

## 2019-02-28 DIAGNOSIS — Z8742 Personal history of other diseases of the female genital tract: Secondary | ICD-10-CM

## 2019-02-28 DIAGNOSIS — F411 Generalized anxiety disorder: Secondary | ICD-10-CM

## 2019-02-28 DIAGNOSIS — Z1239 Encounter for other screening for malignant neoplasm of breast: Secondary | ICD-10-CM

## 2019-02-28 DIAGNOSIS — I10 Essential (primary) hypertension: Secondary | ICD-10-CM

## 2019-02-28 DIAGNOSIS — F331 Major depressive disorder, recurrent, moderate: Secondary | ICD-10-CM

## 2019-02-28 DIAGNOSIS — Z1211 Encounter for screening for malignant neoplasm of colon: Secondary | ICD-10-CM

## 2019-02-28 MED ORDER — DESVENLAFAXINE SUCCINATE ER 100 MG PO TB24: 100.0000 mg | ORAL_TABLET | Freq: Every day | ORAL | 1 refills | Status: DC

## 2019-02-28 MED ORDER — HYDROCHLOROTHIAZIDE 25 MG PO TABS: 25.0000 mg | ORAL_TABLET | Freq: Every day | ORAL | 5 refills | Status: DC

## 2019-02-28 MED ORDER — BUSPIRONE HCL 15 MG PO TABS: 15.0000 mg | ORAL_TABLET | Freq: Three times a day (TID) | ORAL | 3 refills | Status: DC

## 2019-02-28 NOTE — Progress Notes (Signed)
[facility] [place], [place] [address]  Internal MEDICINE  Office Visit Note  Patient Name: Candace Clayton  [id number]  IC:[id number]  Date of Service: 03/12/2019  Chief Complaint  Patient presents with  . Medical Management of Chronic Issues    4 week follow up buspirone medication   . Quality Metric Gaps    mammogram and colonscopy     The patient is here for routine follow up. She continues to have intermittent chest pain. This is related to increased levels of anxiety. Has been weaned off alprazolam completely. Is taking buspirone 10mg  three times daily if needed. She states that this does not help to relieve the chest pain as the alprazolam did. She is seeing pain management provider at this time. She is due to go back to them this coming Wednesday.  She is due to have screening mammogram and colonoscopy.  Finally, she is concerned about some right lower quadrant abdominal/pelvic pain. She did have surgery in 09/2018 to remove a large ovarian cyst. The pain is just to the right of surgical scar. She has not been back to see him since after her surgery.       Current Medication: Outpatient Encounter Medications as of 02/28/2019  Medication Sig  . albuterol (VENTOLIN HFA) 108 (90 Base) MCG/ACT inhaler Inhale 2 puffs into the lungs every 6 (six) hours as needed for wheezing or shortness of breath.  . busPIRone (BUSPAR) 15 MG tablet Take 1 tablet (15 mg total) by mouth 3 (three) times daily. Take one tab po bid for one week, then increase to tid for anxiety  . cetirizine (ZYRTEC) 10 MG tablet TAKE 1 TABLET(S) BY MOUTH DAILY FOR ALLERGIES  . desvenlafaxine (PRISTIQ) 100 MG 24 hr tablet Take 1 tablet (100 mg total) by mouth daily.  . enalapril (VASOTEC) 20 MG tablet Take 1 tablet (20 mg total) by mouth 2 (two) times daily.  . hydrochlorothiazide (HYDRODIURIL) 25 MG tablet Take 1 tablet (25 mg total) by mouth daily.  . hydrOXYzine (ATARAX/VISTARIL) 10 MG tablet  Take 1 tablet (10 mg total) by mouth 3 (three) times daily as needed.  . Multiple Vitamins-Calcium (ONE-A-DAY WOMENS PO) Take 1 tablet by mouth daily.  . NON FORMULARY cpap device  . OVER THE COUNTER MEDICATION 1 tablet daily as needed. Takes generic stool softener for constipation d/t oxycodone.  . [DISCONTINUED] busPIRone (BUSPAR) 10 MG tablet Take 1 tablet (10 mg total) by mouth 3 (three) times daily. Take one tab po bid for one week, then increase to tid for anxiety  . [DISCONTINUED] desvenlafaxine (PRISTIQ) 100 MG 24 hr tablet TAKE 1 TABLET BY MOUTH EVERY DAY  . [DISCONTINUED] hydrochlorothiazide (HYDRODIURIL) 25 MG tablet Take 1 tablet (25 mg total) by mouth daily.  . [DISCONTINUED] oxyCODONE-acetaminophen (PERCOCET/ROXICET) 5-325 MG tablet Take 1-2 tablets by mouth daily as needed for severe pain. For chronic pain, max 35/month   No facility-administered encounter medications on file as of 02/28/2019.     Surgical History: Past Surgical History:  Procedure Laterality Date  . CESAREAN SECTION  1992  . COLONOSCOPY     polyps removed first procedure. 2nd time all was clear  . FINGER SURGERY Left 2011   left finger cut off x 2.(only up to last digit)  . HEMATOMA EVACUATION Left 1999   upper part of foot was injured d/t 500lb weight landing on her foot.   Marland Kitchen LAPAROSCOPIC SALPINGO OOPHERECTOMY Left 04/02/2018   Procedure: LAPAROSCOPIC SALPINGO OOPHORECTOMY;  Surgeon:  Malachy Mood, MD;  Location: [facility] ORS;  Service: Gynecology;  Laterality: Left;    Medical History: Past Medical History:  Diagnosis Date  . Anginal pain (HCC)    tightness related to anxiety  . Anxiety   . Depression   . GERD (gastroesophageal reflux disease)    throws up easily but not diagnosed with reflux  . History of C-section 80  . Hypertension   . Sleep apnea    uses cpap    Family History: Family History  Problem Relation Age of Onset  . Breast cancer Mother 40       x 3 times  . Colon cancer  Mother   . Brain cancer Mother   . Cancer - Colon Mother   . Cancer - Other Mother   . Heart Problems Father   . Colon cancer Brother   . Heart Problems Brother     Social History   Socioeconomic History  . Marital status: Divorced    Spouse name: Not on file  . Number of children: 1  . Years of education: Not on file  . Highest education level: Not on file  Occupational History  . Occupation: works in Secretary/administrator  Social Needs  . Financial resource strain: Not on file  . Food insecurity    Worry: Not on file    Inability: Not on file  . Transportation needs    Medical: Not on file    Non-medical: Not on file  Tobacco Use  . Smoking status: Never Smoker  . Smokeless tobacco: Never Used  Substance and Sexual Activity  . Alcohol use: No    Frequency: Never  . Drug use: No  . Sexual activity: Not Currently    Birth control/protection: Post-menopausal  Lifestyle  . Physical activity    Days per week: Not on file    Minutes per session: Not on file  . Stress: Not on file  Relationships  . Social Herbalist on phone: Not on file    Gets together: Not on file    Attends religious service: Not on file    Active member of club or organization: Not on file    Attends meetings of clubs or organizations: Not on file    Relationship status: Not on file  . Intimate partner violence    Fear of current or ex partner: Not on file    Emotionally abused: Not on file    Physically abused: Not on file    Forced sexual activity: Not on file  Other Topics Concern  . Not on file  Social History Narrative   Son has schizophrenia and autism but is fully capable of helping mother after surgery      Review of Systems  Constitutional: Positive for fatigue. Negative for activity change, appetite change and unexpected weight change.  HENT: Negative for congestion, postnasal drip, rhinorrhea and sore throat.   Respiratory: Negative for chest tightness, shortness of  breath and wheezing.   Cardiovascular: Negative for chest pain and palpitations.  Gastrointestinal: Positive for abdominal pain. Negative for constipation, diarrhea, nausea and vomiting.       Right lower quadrant discomfort. Similar area as that where she had large ovarian cyst removed earlier this year.   Endocrine: Negative for cold intolerance, heat intolerance, polydipsia and polyuria.  Genitourinary: Positive for pelvic pain.  Musculoskeletal: Positive for arthralgias, back pain and myalgias.  Skin: Negative for rash.  Allergic/Immunologic: Negative for environmental allergies.  Neurological: Negative  for dizziness, weakness, numbness and headaches.  Hematological: Negative for adenopathy.  Psychiatric/Behavioral: Positive for behavioral problems. Negative for sleep disturbance. The patient is nervous/anxious.    Today's Vitals   02/28/19 1036  BP: 138/74  Pulse: 75  Resp: 16  SpO2: 97%  Weight: (!) 348 lb (157.9 kg)  Height: 5\' 9"  (1.753 m)   Body mass index is 51.39 kg/m.  Physical Exam Vitals signs and nursing note reviewed.  Constitutional:      General: She is not in acute distress.    Appearance: Normal appearance. She is well-developed. She is obese. She is not diaphoretic.  HENT:     Head: Normocephalic and atraumatic.     Mouth/Throat:     Pharynx: No oropharyngeal exudate.  Eyes:     Pupils: Pupils are equal, round, and reactive to light.  Neck:     Musculoskeletal: Normal range of motion and neck supple.     Thyroid: No thyromegaly.     Vascular: No JVD.     Trachea: No tracheal deviation.  Cardiovascular:     Rate and Rhythm: Normal rate and regular rhythm.     Heart sounds: Normal heart sounds. No murmur. No friction rub. No gallop.   Pulmonary:     Effort: Pulmonary effort is normal. No respiratory distress.     Breath sounds: Normal breath sounds. No wheezing or rales.  Chest:     Chest wall: No tenderness.  Abdominal:     General: Bowel sounds  are normal.     Palpations: Abdomen is soft.     Tenderness: There is abdominal tenderness in the suprapubic area. There is no guarding.  Musculoskeletal: Normal range of motion.  Lymphadenopathy:     Cervical: No cervical adenopathy.  Skin:    General: Skin is warm and dry.  Neurological:     Mental Status: She is alert and oriented to person, place, and time.     Cranial Nerves: No cranial nerve deficit.  Psychiatric:        Behavior: Behavior normal.        Thought Content: Thought content normal.        Judgment: Judgment normal.   Assessment/Plan:  1. Right lower quadrant pain Persistent tenderness in right lower quadrant of the abdomen. Has not followed up with GYN provider since her surgery. Referral made to provider for further evaluation and treatment.  - Ambulatory referral to Gynecology  2. History of ovarian cyst Removal of large ovarian cyst earlier this year. She has not had follow up with surgeon since then. Refer back to GYN for further evaluation and treatment.  - Ambulatory referral to Gynecology  3. Essential hypertension Stable. Continue bp medication as prescribed.  - hydrochlorothiazide (HYDRODIURIL) 25 MG tablet; Take 1 tablet (25 mg total) by mouth daily.  Dispense: 30 tablet; Refill: 5  4. GAD (generalized anxiety disorder) Increased bupsione to 10mg  up to three times daily as needed for acute anxiety.  - busPIRone (BUSPAR) 15 MG tablet; Take 1 tablet (15 mg total) by mouth 3 (three) times daily. Take one tab po bid for one week, then increase to tid for anxiety  Dispense: 90 tablet; Refill: 3  5. Major depressive disorder, recurrent episode, moderate (HCC) Continue pristiq 100mg  daily.  - desvenlafaxine (PRISTIQ) 100 MG 24 hr tablet; Take 1 tablet (100 mg total) by mouth daily.  Dispense: 90 tablet; Refill: 1  6. Screening for breast cancer - MM 3D SCREEN BREAST BILATERAL; Future  7. Screening  for colon cancer Refer t oGI for screening colonoscopy.   - Ambulatory referral to Gastroenterology  General Counseling: Candace Clayton verbalizes understanding of the findings of todays visit and agrees with plan of treatment. I have discussed any further diagnostic evaluation that may be needed or ordered today. We also reviewed her medications today. she has been encouraged to call the office with any questions or concerns that should arise related to todays visit.  This patient was seen by Leretha Pol FNP Collaboration with Dr Lavera Guise as a part of collaborative care agreement  Orders Placed This Encounter  Procedures  . MM 3D SCREEN BREAST BILATERAL  . Ambulatory referral to Gynecology  . Ambulatory referral to Gastroenterology    Meds ordered this encounter  Medications  . busPIRone (BUSPAR) 15 MG tablet    Sig: Take 1 tablet (15 mg total) by mouth 3 (three) times daily. Take one tab po bid for one week, then increase to tid for anxiety    Dispense:  90 tablet    Refill:  3    Order Specific Question:   Supervising Provider    Answer:   Lavera Guise [place]  . desvenlafaxine (PRISTIQ) 100 MG 24 hr tablet    Sig: Take 1 tablet (100 mg total) by mouth daily.    Dispense:  90 tablet    Refill:  1    Order Specific Question:   Supervising Provider    Answer:   Lavera Guise [id number]  . hydrochlorothiazide (HYDRODIURIL) 25 MG tablet    Sig: Take 1 tablet (25 mg total) by mouth daily.    Dispense:  30 tablet    Refill:  5    Order Specific Question:   Supervising Provider    Answer:   Lavera Guise [id number]    Time spent: 76  Minutes      Dr Lavera Guise Internal medicine

## 2019-03-04 ENCOUNTER — Telehealth: Payer: Self-pay | Admitting: Obstetrics and Gynecology

## 2019-03-04 ENCOUNTER — Ambulatory Visit: Payer: BC Managed Care – PPO | Admitting: Internal Medicine

## 2019-03-04 ENCOUNTER — Telehealth: Payer: Self-pay | Admitting: Gastroenterology

## 2019-03-04 ENCOUNTER — Other Ambulatory Visit: Payer: Self-pay

## 2019-03-04 DIAGNOSIS — Z1211 Encounter for screening for malignant neoplasm of colon: Secondary | ICD-10-CM

## 2019-03-04 MED ORDER — NA SULFATE-K SULFATE-MG SULF 17.5-3.13-1.6 GM/177ML PO SOLN: 1.0000 | Freq: Once | ORAL | 0 refills | Status: AC

## 2019-03-04 NOTE — Telephone Encounter (Signed)
Patient l/m on vm returning call to schedule colonoscopy.

## 2019-03-04 NOTE — Telephone Encounter (Signed)
[place] referring for History of ovarian cyst ,right lower quadrant pain with AMS. Called and left voicemail for patient to call back to be schedule

## 2019-03-05 ENCOUNTER — Telehealth: Payer: Self-pay | Admitting: *Deleted

## 2019-03-05 ENCOUNTER — Ambulatory Visit
Payer: BC Managed Care – PPO | Attending: Student in an Organized Health Care Education/Training Program | Admitting: Student in an Organized Health Care Education/Training Program

## 2019-03-05 ENCOUNTER — Encounter: Payer: Self-pay | Admitting: Student in an Organized Health Care Education/Training Program

## 2019-03-05 ENCOUNTER — Other Ambulatory Visit: Payer: Self-pay

## 2019-03-05 DIAGNOSIS — G894 Chronic pain syndrome: Secondary | ICD-10-CM | POA: Diagnosis not present

## 2019-03-05 DIAGNOSIS — M47816 Spondylosis without myelopathy or radiculopathy, lumbar region: Secondary | ICD-10-CM

## 2019-03-05 DIAGNOSIS — M5440 Lumbago with sciatica, unspecified side: Secondary | ICD-10-CM | POA: Diagnosis not present

## 2019-03-05 DIAGNOSIS — M5136 Other intervertebral disc degeneration, lumbar region: Secondary | ICD-10-CM | POA: Diagnosis not present

## 2019-03-05 MED ORDER — OXYCODONE-ACETAMINOPHEN 5-325 MG PO TABS: 1.0000 | ORAL_TABLET | Freq: Every day | ORAL | 0 refills | Status: DC | PRN

## 2019-03-05 NOTE — Telephone Encounter (Signed)
[facility] notified that [facility] for [facility], dated 02-07-19.

## 2019-03-05 NOTE — Telephone Encounter (Signed)
Called and left voice mail for patient to call back to be schedule °

## 2019-03-05 NOTE — Progress Notes (Signed)
Pain Management Virtual Encounter Note - Virtual Visit via [place] (real-time audio visits between healthcare provider and patient).   Patient's Phone No. & Preferred Pharmacy:  [phone] (home); [phone] (mobile); (Preferred) [phone] No e-mail address on record  [id number] [place], [place] [address] [address] Phone: [phone] Fax: [phone]  [facility] [address]), [place] - [place]) [place] [address] Phone: [phone] Fax: [phone]    Pre-screening note:  Our staff contacted Candace Clayton and offered her an "in person", "face-to-face" appointment versus a telephone encounter. She indicated preferring the telephone encounter, at this time.   Reason for Virtual Visit: COVID-19*  Social distancing based on CDC and AMA recommendations.   I contacted Candace Clayton on 03/05/2019 via video conference.      I clearly identified myself as Candace Santa, MD. I verified that I was speaking with the correct person using two identifiers (Name: Candace Clayton, and date of birth: 08-27-1960).  Advanced Informed Consent I sought verbal advanced consent from Candace Clayton for virtual visit interactions. I informed Candace Clayton of possible security and privacy concerns, risks, and limitations associated with providing "not-in-person" medical evaluation and management services. I also informed Candace Clayton of the availability of "in-person" appointments. Finally, I informed her that there would be a charge for the virtual visit and that she could be  personally, fully or partially, financially responsible for it. Candace Clayton expressed understanding and agreed to proceed.   Historic Elements   Candace Clayton is a 58 y.o. year old, female patient evaluated today after her last encounter by our practice on 01/28/2019. Candace Clayton  has a past medical  history of Anginal pain ([place]), Anxiety, Depression, GERD (gastroesophageal reflux disease), History of C-section (1992), Hypertension, and Sleep apnea. She also  has a past surgical history that includes Cesarean section (1992); Finger surgery (Left, 2011); Hematoma evacuation (Left, 1999); Colonoscopy; and Laparoscopic salpingo oophorectomy (Left, 04/02/2018). Candace Clayton has a Clayton medication list which includes the following prescription(s): albuterol, buspirone, cetirizine, desvenlafaxine, enalapril, hydrochlorothiazide, hydroxyzine, multiple vitamins-minerals, NON FORMULARY, OVER THE COUNTER MEDICATION, and oxycodone-acetaminophen. She  reports that she has never smoked. She has never used smokeless tobacco. She reports that she does not drink alcohol or use drugs. Candace Clayton is allergic to gabapentin.   HPI  Today, she is being contacted for both, medication management and a post-procedure assessment.   Evaluation of last interventional procedure  01/08/2019 Procedure:  Type: Lumbar Facet, Medial Branch Block(s) #1  Primary Purpose: Diagnostic Region: Posterolateral Lumbosacral Spine Level:  L3, L4, L5, & S1 Medial Branch Level(s). Injecting these levels blocks the L3-4, L4-5, and L5-S1 lumbar facet joints. Laterality: Bilateral   Pre-procedure pain score:  7/10 Post-procedure pain score: 0/10         Influential Factors: Intra-procedural challenges: None observed.         Reported side-effects: None.        Post-procedural adverse reactions or complications: None reported         Sedation: Please see nurses note for DOS. When no sedatives are used, the analgesic levels obtained are directly associated to the effectiveness of the local anesthetics. However, when sedation is provided, the level of analgesia obtained during the initial 1 hour following the intervention, is believed to be the result of a combination of factors. These factors may include, but are  not limited to: 1. The  effectiveness of the local anesthetics used. 2. The effects of the analgesic(s) and/or anxiolytic(s) used. 3. The degree of discomfort experienced by the patient at the time of the procedure. 4. The patients ability and reliability in recalling and recording the events. 5. The presence and influence of possible secondary gains and/or psychosocial factors. Reported result: Relief experienced during the 1st hour after the procedure: 100%   (Ultra-Short Term Relief)            Interpretative annotation: Clinically appropriate result. Analgesia during this period is likely to be Local Anesthetic and/or IV Sedative (Analgesic/Anxiolytic) related.          Effects of local anesthetic: The analgesic effects attained during this period are directly associated to the localized infiltration of local anesthetics and therefore cary significant diagnostic value as to the etiological location, or anatomical origin, of the pain. Expected duration of relief is directly dependent on the pharmacodynamics of the local anesthetic used. Long-acting (4-6 hours) anesthetics used.  Reported result: Relief during the next 4 to 6 hour after the procedure: 100%   (Short-Term Relief)            Interpretative annotation: Clinically appropriate result. Analgesia during this period is likely to be Local Anesthetic-related.          Long-term benefit: Defined as the period of time past the expected duration of local anesthetics (1 hour for short-acting and 4-6 hours for long-acting). With the possible exception of prolonged sympathetic blockade from the local anesthetics, benefits during this period are typically attributed to, or associated with, other factors such as analgesic sensory neuropraxia, antiinflammatory effects, or beneficial biochemical changes provided by agents other than the local anesthetics.  Reported result: Extended relief following procedure:75% for 5 days with gradual return of pain thereafter   (Long-Term  Relief)            Interpretative annotation: Clinically appropriate result. Good relief. No permanent benefit expected. Inflammation plays a part in the etiology to the pain.           Repeat diagnostic lumbar facet medial branch nerve block #2  Pharmacotherapy Assessment  Analgesic:  01/08/2019  1   01/08/2019  Oxycodone-Acetaminophen 5-325  35.00  30 Bi Lat   [id number]   Nor (0921)   0  8.75 MME  Comm Ins   [place]     Monitoring: Pharmacotherapy: No side-effects or adverse reactions reported. [place] PMP: PDMP reviewed during this encounter.       Compliance: No problems identified. Effectiveness: Clinically acceptable. Plan: Refer to "POC".  UDS:  Summary  Date Value Ref Range Status  07/02/2018 FINAL  Final    Comment:    ==================================================================== TOXASSURE COMP DRUG ANALYSIS,UR ==================================================================== Test                             Result       Flag       Units Drug Present and Declared for Prescription Verification   Alprazolam                     86           EXPECTED   ng/mg creat   Alpha-hydroxyalprazolam        72           EXPECTED   ng/mg creat    Source of alprazolam is a scheduled prescription medication.  Alpha-hydroxyalprazolam is an expected metabolite of alprazolam.   Desmethylvenlafaxine           PRESENT      EXPECTED    Desmethylvenlafaxine may be present due to administration of    desvenlafaxine; it is also an expected metabolite of venlafaxine. Drug Present not Declared for Prescription Verification   Salicylate                     PRESENT      UNEXPECTED   Naproxen                       PRESENT      UNEXPECTED Drug Absent but Declared for Prescription Verification   Ephedrine/Pseudoephedrine      Not Detected UNEXPECTED   Gabapentin                     Not Detected UNEXPECTED   Acetaminophen                  Not Detected UNEXPECTED    Acetaminophen, as indicated in the  declared medication list, is    not always detected even when used as directed.   Diclofenac                     Not Detected UNEXPECTED    Diclofenac, as indicated in the declared medication list, is not    always detected even when used as directed.   Doxylamine                     Not Detected UNEXPECTED   Dextromethorphan               Not Detected UNEXPECTED ==================================================================== Test                      Result    Flag   Units      Ref Range   Creatinine              71               mg/dL      >=20 ==================================================================== Declared Medications:  The flagging and interpretation on this report are based on the  following declared medications.  Unexpected results may arise from  inaccuracies in the declared medications.  **Note: The testing scope of this panel includes these medications:  Alprazolam  Desvenlafaxine  Dextromethorphan  Doxylamine  Gabapentin  Pseudoephedrine  **Note: The testing scope of this panel does not include small to  moderate amounts of these reported medications:  Acetaminophen  Diclofenac  **Note: The testing scope of this panel does not include following  reported medications:  Acyclovir  Calcium  Cetirizine  Docusate (Stool Softener)  Enalapril  Hydrochlorothiazide  Multivitamin ==================================================================== For clinical consultation, please call [phone]. ====================================================================    Laboratory Chemistry Profile (12 mo)  Renal: 04/03/2018: BUN 20; Creatinine, Ser 0.85  Lab Results  Component Value Date   GFRAA >60 04/03/2018   GFRNONAA >60 04/03/2018   Hepatic: 04/03/2018: Albumin 4.3 Lab Results  Component Value Date   AST 24 04/03/2018   ALT 28 04/03/2018   Other: No results found for requested labs within last 8760 hours. Note: Above Lab results  reviewed.   Assessment  The primary encounter diagnosis was Lumbar facet arthropathy (R>L). Diagnoses of Low back pain with sciatica, sciatica laterality unspecified, unspecified back pain laterality,  unspecified chronicity, Lumbar spondylosis, Lumbar degenerative disc disease, and Chronic pain syndrome were also pertinent to this visit.  Plan of Care  I am having Candace Clayton maintain her Multiple Vitamins-Calcium (ONE-A-DAY WOMENS PO), OVER THE COUNTER MEDICATION, hydrOXYzine, enalapril, cetirizine, NON FORMULARY, albuterol, busPIRone, desvenlafaxine, hydrochlorothiazide, and oxyCODONE-acetaminophen.  At patient's last visit with me in July 2020, she was written 2 prescriptions of Percocet however [facility] on [facility].  The second prescription was dated with a fill date of 02/07/2019.  Nursing staff did [facility] and then call the patient to inform her that a prescription was available for her to fill.  We will send in another prescription 1 month from today, dated 04-04-2019 for Percocet as well. Pharmacotherapy (Medications Ordered): Meds ordered this encounter  Medications  . oxyCODONE-acetaminophen (PERCOCET/ROXICET) 5-325 MG tablet    Sig: Take 1-2 tablets by mouth daily as needed for severe pain. For chronic pain, max 35/month    Dispense:  35 tablet    Refill:  0     Given positive diagnostic lumbar facet medial branch nerve block #1 above, discussed repeating as below and possible radiofrequency ablation in future.  Orders:  Orders Placed This Encounter  Procedures  . LUMBAR FACET(MEDIAL BRANCH NERVE BLOCK) MBNB    Standing Status:   Future    Standing Expiration Date:   04/04/2019    Scheduling Instructions:     Side: Bilateral     Level: L3-4, L4-5, & L5-S1 Facets (L3, L4, L5, & S1 Medial Branch Nerves)     Sedation: with      Timeframe: ASAA    Order Specific Question:   Where will this procedure be performed?    Answer:   [facility]   Follow-up plan:   Return for Procedure B/L L3, 4, 5 S1 Fcts #2 , with sedation.     Status post diagnostic bilateral L3, L4, L5, S1 facet medial branch nerve blocks #1 on 01/08/2019, return for #2   Recent Visits Date Type Provider Dept  01/08/19 Procedure visit Candace Santa, MD [facility]  Showing recent visits within past 90 days and meeting all other requirements   Today's Visits Date Type Provider Dept  03/05/19 Office Visit Candace Santa, MD [facility]  Showing today's visits and meeting all other requirements   Future Appointments No visits were found meeting these conditions.  Showing future appointments within next 90 days and meeting all other requirements   I discussed the assessment and treatment plan with the patient. The patient was provided an opportunity to ask questions and all were answered. The patient agreed with the plan and demonstrated an understanding of the instructions.  Patient advised to call back or seek an in-person evaluation if the symptoms or condition worsens.  Total duration of non-face-to-face encounter: 25 minutes.  Note by: Candace Santa, MD Date: 03/05/2019; Time: 2:15 PM  Note: This dictation was prepared with Dragon dictation. Any transcriptional errors that may result from this process are unintentional.  Disclaimer:  * Given the special circumstances of the COVID-19 pandemic, the federal government has announced that the Office for Civil Rights (OCR) will exercise its enforcement discretion and will not impose penalties on physicians using telehealth in the event of noncompliance with regulatory requirements under the [place] (HIPAA) in connection with the good faith  provision of telehealth during the XX123456 national public health emergency. (AMA)

## 2019-03-06 ENCOUNTER — Encounter: Payer: Self-pay | Admitting: Internal Medicine

## 2019-03-06 ENCOUNTER — Ambulatory Visit: Payer: BC Managed Care – PPO | Admitting: Internal Medicine

## 2019-03-06 ENCOUNTER — Other Ambulatory Visit: Payer: Self-pay

## 2019-03-06 ENCOUNTER — Other Ambulatory Visit: Payer: Self-pay | Admitting: Nurse Practitioner

## 2019-03-06 ENCOUNTER — Ambulatory Visit
Admission: RE | Admit: 2019-03-06 | Discharge: 2019-03-06 | Disposition: A | Discharge status: Home / Self Care | Payer: BC Managed Care – PPO | Source: Ambulatory Visit | Attending: Nurse Practitioner | Admitting: Nurse Practitioner

## 2019-03-06 VITALS — BP 138/70 | HR 73 | Resp 16 | Ht 69.0 in | Wt 355.0 lb

## 2019-03-06 DIAGNOSIS — G4733 Obstructive sleep apnea (adult) (pediatric): Secondary | ICD-10-CM

## 2019-03-06 DIAGNOSIS — R0602 Shortness of breath: Secondary | ICD-10-CM | POA: Diagnosis not present

## 2019-03-06 DIAGNOSIS — J4520 Mild intermittent asthma, uncomplicated: Secondary | ICD-10-CM | POA: Diagnosis not present

## 2019-03-06 DIAGNOSIS — Z1231 Encounter for screening mammogram for malignant neoplasm of breast: Secondary | ICD-10-CM | POA: Diagnosis not present

## 2019-03-06 DIAGNOSIS — R7301 Impaired fasting glucose: Secondary | ICD-10-CM | POA: Diagnosis not present

## 2019-03-06 DIAGNOSIS — I10 Essential (primary) hypertension: Secondary | ICD-10-CM

## 2019-03-06 DIAGNOSIS — Z1239 Encounter for other screening for malignant neoplasm of breast: Secondary | ICD-10-CM

## 2019-03-06 DIAGNOSIS — E559 Vitamin D deficiency, unspecified: Secondary | ICD-10-CM | POA: Diagnosis not present

## 2019-03-06 DIAGNOSIS — Z0001 Encounter for general adult medical examination with abnormal findings: Secondary | ICD-10-CM | POA: Diagnosis not present

## 2019-03-06 DIAGNOSIS — Z9989 Dependence on other enabling machines and devices: Secondary | ICD-10-CM

## 2019-03-06 NOTE — Progress Notes (Signed)
[facility] [place], [place] [address]  Pulmonary Sleep Medicine   Office Visit Note  Patient Name: Candace Clayton DOB: 06/21/1961 MRN IC:[id number]  Date of Service: 03/06/2019  Complaints/HPI: Pt is here for follow up, she has a 58 yo machine, making a  Noise.  Worried its going to stop working. Needs new sleep study to order new machine. She is having difficulty with daytime fatigue, and morning headaches.  She is feeling tired in the afternoons.  She has gained a bit a weight since her machine was ordered 5-6 years ago. She also reports increased sob with exertion, she is using her rescue inhaler a couple times per day.    ROS  General: (-) fever, (-) chills, (-) night sweats, (-) weakness Skin: (-) rashes, (-) itching,. Eyes: (-) visual changes, (-) redness, (-) itching. Nose and Sinuses: (-) nasal stuffiness or itchiness, (-) postnasal drip, (-) nosebleeds, (-) sinus trouble. Mouth and Throat: (-) sore throat, (-) hoarseness. Neck: (-) swollen glands, (-) enlarged thyroid, (-) neck pain. Respiratory: - cough, (-) bloody sputum, - shortness of breath, - wheezing. Cardiovascular: - ankle swelling, (-) chest pain. Lymphatic: (-) lymph node enlargement. Neurologic: (-) numbness, (-) tingling. Psychiatric: (-) anxiety, (-) depression   Current Medication: Outpatient Encounter Medications as of 03/06/2019  Medication Sig  . albuterol (VENTOLIN HFA) 108 (90 Base) MCG/ACT inhaler Inhale 2 puffs into the lungs every 6 (six) hours as needed for wheezing or shortness of breath.  . busPIRone (BUSPAR) 15 MG tablet Take 1 tablet (15 mg total) by mouth 3 (three) times daily. Take one tab po bid for one week, then increase to tid for anxiety  . cetirizine (ZYRTEC) 10 MG tablet TAKE 1 TABLET(S) BY MOUTH DAILY FOR ALLERGIES  . desvenlafaxine (PRISTIQ) 100 MG 24 hr tablet Take 1 tablet (100 mg total) by mouth daily.  . enalapril (VASOTEC) 20 MG tablet Take 1 tablet (20 mg  total) by mouth 2 (two) times daily.  . hydrochlorothiazide (HYDRODIURIL) 25 MG tablet Take 1 tablet (25 mg total) by mouth daily.  . hydrOXYzine (ATARAX/VISTARIL) 10 MG tablet Take 1 tablet (10 mg total) by mouth 3 (three) times daily as needed.  . Multiple Vitamins-Calcium (ONE-A-DAY WOMENS PO) Take 1 tablet by mouth daily.  . NON FORMULARY cpap device  . OVER THE COUNTER MEDICATION 1 tablet daily as needed. Takes generic stool softener for constipation d/t oxycodone.  Derrill Memo ON 04/04/2019] oxyCODONE-acetaminophen (PERCOCET/ROXICET) 5-325 MG tablet Take 1-2 tablets by mouth daily as needed for severe pain. For chronic pain, max 35/month   No facility-administered encounter medications on file as of 03/06/2019.     Surgical History: Past Surgical History:  Procedure Laterality Date  . CESAREAN SECTION  1992  . COLONOSCOPY     polyps removed first procedure. 2nd time all was clear  . FINGER SURGERY Left 2011   left finger cut off x 2.(only up to last digit)  . HEMATOMA EVACUATION Left 1999   upper part of foot was injured d/t 500lb weight landing on her foot.   Marland Kitchen LAPAROSCOPIC SALPINGO OOPHERECTOMY Left 04/02/2018   Procedure: LAPAROSCOPIC SALPINGO OOPHORECTOMY;  Surgeon: Malachy Mood, MD;  Location: [facility] ORS;  Service: Gynecology;  Laterality: Left;    Medical History: Past Medical History:  Diagnosis Date  . Anginal pain (HCC)    tightness related to anxiety  . Anxiety   . Depression   . GERD (gastroesophageal reflux disease)    throws up easily  but not diagnosed with reflux  . History of C-section 10  . Hypertension   . Sleep apnea    uses cpap    Family History: Family History  Problem Relation Age of Onset  . Breast cancer Mother 40       x 3 times  . Colon cancer Mother   . Brain cancer Mother   . Cancer - Colon Mother   . Cancer - Other Mother   . Heart Problems Father   . Colon cancer Brother   . Heart Problems Brother     Social History: Social  History   Socioeconomic History  . Marital status: Divorced    Spouse name: Not on file  . Number of children: 1  . Years of education: Not on file  . Highest education level: Not on file  Occupational History  . Occupation: works in Secretary/administrator  Social Needs  . Financial resource strain: Not on file  . Food insecurity    Worry: Not on file    Inability: Not on file  . Transportation needs    Medical: Not on file    Non-medical: Not on file  Tobacco Use  . Smoking status: Never Smoker  . Smokeless tobacco: Never Used  Substance and Sexual Activity  . Alcohol use: No    Frequency: Never  . Drug use: No  . Sexual activity: Not Currently    Birth control/protection: Post-menopausal  Lifestyle  . Physical activity    Days per week: Not on file    Minutes per session: Not on file  . Stress: Not on file  Relationships  . Social Herbalist on phone: Not on file    Gets together: Not on file    Attends religious service: Not on file    Active member of club or organization: Not on file    Attends meetings of clubs or organizations: Not on file    Relationship status: Not on file  . Intimate partner violence    Fear of current or ex partner: Not on file    Emotionally abused: Not on file    Physically abused: Not on file    Forced sexual activity: Not on file  Other Topics Concern  . Not on file  Social History Narrative   Son has schizophrenia and autism but is fully capable of helping mother after surgery    Vital Signs: Blood pressure 138/70, pulse 73, resp. rate 16, height 5\' 9"  (1.753 m), weight (!) 355 lb (161 kg), SpO2 93 %.  Examination: General Appearance: The patient is well-developed, well-nourished, and in no distress. Skin: Gross inspection of skin unremarkable. Head: normocephalic, no gross deformities. Eyes: no gross deformities noted. ENT: ears appear grossly normal no exudates. Neck: Supple. No thyromegaly. No LAD. Respiratory: clear  bilaterally. Cardiovascular: Normal S1 and S2 without murmur or rub. Extremities: No cyanosis. pulses are equal. Neurologic: Alert and oriented. No involuntary movements.  LABS: Recent Results (from the past 2160 hour(s))  POCT Urine Drug Screen     Status: None   Collection Time: 12/26/18 10:08 AM  Result Value Ref Range   POC METHAMPHETAMINE UR None Detected None Detected   POC Opiate Ur None Detected None Detected   POC Barbiturate UR None Detected None Detected   POC Amphetamine UR None Detected None Detected   POC Oxycodone UR None Detected None Detected   POC Cocaine UR None Detected None Detected   POC Ecstasy UR None Detected  None Detected   POC TRICYCLICS UR None Detected None Detected   POC PHENCYCLIDINE UR None Detected None Detected   POC MARIJUANA UR None Detected None Detected   POC METHADONE UR None Detected None Detected   POC BENZODIAZEPINES UR None Detected None Detected   URINE TEMPERATURE     POC DRUG SCREEN OXIDANTS URINE     POC SPECIFIC GRAVITY URINE     POC PH URINE     Methylenedioxyamphetamine    POCT Urinalysis Dipstick     Status: Abnormal   Collection Time: 12/26/18 10:08 AM  Result Value Ref Range   Color, UA     Clarity, UA     Glucose, UA Positive (A) Negative   Bilirubin, UA negative    Ketones, UA trace    Spec Grav, UA 1.020 1.010 - 1.025   Blood, UA negative    pH, UA 5.0 5.0 - 8.0   Protein, UA Negative Negative   Urobilinogen, UA 0.2 0.2 or 1.0 E.U./dL   Nitrite, UA negative    Leukocytes, UA Negative Negative   Appearance     Odor    SARS Coronavirus 2 (Performed in [place] hospital lab)     Status: None   Collection Time: 01/03/19 12:57 PM   Specimen: Nasal Swab  Result Value Ref Range   SARS Coronavirus 2 NEGATIVE NEGATIVE    Comment: (NOTE) SARS-CoV-2 target nucleic acids are NOT DETECTED. The SARS-CoV-2 RNA is generally detectable in upper and lower respiratory specimens during the acute phase of infection.  Negative results do not preclude SARS-CoV-2 infection, do not rule out co-infections with other pathogens, and should not be used as the sole basis for treatment or other patient management decisions. Negative results must be combined with clinical observations, patient history, and epidemiological information. The expected result is Negative. Fact Sheet for Patients: SugarRoll.be Fact Sheet for Healthcare Providers: https://www.woods-mathews.com/ This test is not yet approved or cleared by the [place] FDA and  has been authorized for detection and/or diagnosis of SARS-CoV-2 by FDA under an Emergency Use Authorization (EUA). This EUA will remain  in effect (meaning this test can be used) for the duration of the COVID-19 declaration under Section 56 4(b)(1) of the Act, 21 U.S.C. section 360bbb-3(b)(1), unless the authorization is terminated or revoked sooner. Performed at [facility], [place] [address], [place], [place] [address]     Radiology: [facility] C-arm 1-60 Min No Report  Result Date: 01/08/2019 Fluoro was used, but no Radiologist interpretation will be provided. Please refer to "NOTES" tab for provider progress note.   No results found.  No results found.    Assessment and Plan: Patient Active Problem List   Diagnosis Date Noted  . Chronic pain syndrome 03/05/2019  . Seasonal allergic rhinitis due to pollen 08/26/2018  . Lumbar facet arthropathy (R>L) 08/22/2018  . Lumbar spondylosis 08/22/2018  . Left ovarian cyst 03/03/2018  . Lumbar degenerative disc disease 12/23/2017  . Screening for breast cancer 10/24/2017  . Low back pain with sciatica 07/03/2017  . Allergic rhinitis, unspecified 07/03/2017  . Pain in unspecified knee 07/03/2017  . Obstructive sleep apnea, adult 07/03/2017  . Sedative, hypnotic, or anxiolytic use, unspecified, uncomplicated [id number]  . Hypersomnia 06/27/2017  . Major  depressive disorder, recurrent episode, moderate ([place]) 06/27/2017  . Essential hypertension 06/27/2017  . Morbid obesity ([place]) 06/27/2017  . GAD (generalized anxiety disorder) 06/27/2017  . Urinary tract infection 06/27/2017   symbicort sample. 1. OSA  on CPAP Will order home sleep study, and evaluate pt for new machine.  - Home sleep test  2. Mild intermittent asthma without complication Due to patient needing rescue inhaler often, given sample of symbicort, and after trial will order daily inhaler if good results.   3. Essential (primary) hypertension Controlled currently, continue present management.   4. Morbid obesity ([place]) Obesity Counseling: Risk Assessment: An assessment of behavioral risk factors was made today and includes lack of exercise sedentary lifestyle, lack of portion control and poor dietary habits.  Risk Modification Advice: She was counseled on portion control guidelines. Restricting daily caloric intake to. . The detrimental long term effects of obesity on her health and ongoing poor compliance was also discussed with the patient.  5. SOB (shortness of breath) - Spirometry with Graph  General Counseling: I have discussed the findings of the evaluation and examination with Zeyna.  I have also discussed any further diagnostic evaluation thatmay be needed or ordered today. Shaniece verbalizes understanding of the findings of todays visit. We also reviewed her medications today and discussed drug interactions and side effects including but not limited excessive drowsiness and altered mental states. We also discussed that there is always a risk not just to her but also people around her. she has been encouraged to call the office with any questions or concerns that should arise related to todays visit.    Time spent: 25  I have personally obtained a history, examined the patient, evaluated laboratory and imaging results, formulated the assessment and plan and placed  orders.    Allyne Gee, MD [facility] Pulmonary and Critical Care Sleep medicine

## 2019-03-07 LAB — CBC
Hematocrit: 36.2 % (ref 34.0–46.6)
Hemoglobin: 11.6 g/dL (ref 11.1–15.9)
MCH: 25.4 pg — ABNORMAL LOW (ref 26.6–33.0)
MCHC: 32 g/dL (ref 31.5–35.7)
MCV: 79 fL (ref 79–97)
Platelets: 266 10*3/uL (ref 150–450)
RBC: 4.57 x10E6/uL (ref 3.77–5.28)
RDW: 15.7 % — ABNORMAL HIGH (ref 11.7–15.4)
WBC: 6.8 10*3/uL (ref 3.4–10.8)

## 2019-03-07 LAB — COMPREHENSIVE METABOLIC PANEL
ALT: 30 IU/L (ref 0–32)
AST: 26 IU/L (ref 0–40)
Albumin/Globulin Ratio: 1.6 (ref 1.2–2.2)
Albumin: 3.9 g/dL (ref 3.8–4.9)
Alkaline Phosphatase: 92 IU/L (ref 39–117)
BUN/Creatinine Ratio: 15 (ref 9–23)
BUN: 12 mg/dL (ref 6–24)
Bilirubin Total: 0.2 mg/dL (ref 0.0–1.2)
CO2: 23 mmol/L (ref 20–29)
Calcium: 9.1 mg/dL (ref 8.7–10.2)
Chloride: 105 mmol/L (ref 96–106)
Creatinine, Ser: 0.8 mg/dL (ref 0.57–1.00)
GFR calc Af Amer: 94 mL/min/{1.73_m2} (ref >59)
GFR calc non Af Amer: 82 mL/min/{1.73_m2} (ref >59)
Globulin, Total: 2.4 g/dL (ref 1.5–4.5)
Glucose: 152 mg/dL — ABNORMAL HIGH (ref 65–99)
Potassium: 4.9 mmol/L (ref 3.5–5.2)
Sodium: 139 mmol/L (ref 134–144)
Total Protein: 6.3 g/dL (ref 6.0–8.5)

## 2019-03-07 LAB — LIPID PANEL W/O CHOL/HDL RATIO
Cholesterol, Total: 226 mg/dL — ABNORMAL HIGH (ref 100–199)
HDL: 55 mg/dL (ref >39)
LDL Chol Calc (NIH): 129 mg/dL — ABNORMAL HIGH (ref 0–99)
Triglycerides: 237 mg/dL — ABNORMAL HIGH (ref 0–149)
VLDL Cholesterol Cal: 42 mg/dL — ABNORMAL HIGH (ref 5–40)

## 2019-03-07 LAB — T4, FREE: Free T4: 0.88 ng/dL (ref 0.82–1.77)

## 2019-03-07 LAB — HGB A1C W/O EAG: Hgb A1c MFr Bld: 7.6 % — ABNORMAL HIGH (ref 4.8–5.6)

## 2019-03-07 LAB — TSH: TSH: 1.84 u[IU]/mL (ref 0.450–4.500)

## 2019-03-07 LAB — VITAMIN D 25 HYDROXY (VIT D DEFICIENCY, FRACTURES): Vit D, 25-Hydroxy: 17.2 ng/mL — ABNORMAL LOW (ref 30.0–100.0)

## 2019-03-11 ENCOUNTER — Telehealth: Payer: Self-pay

## 2019-03-11 ENCOUNTER — Other Ambulatory Visit: Payer: Self-pay

## 2019-03-11 ENCOUNTER — Other Ambulatory Visit
Admission: RE | Admit: 2019-03-11 | Discharge: 2019-03-11 | Disposition: A | Discharge status: Home / Self Care | Payer: BC Managed Care – PPO | Source: Ambulatory Visit | Attending: Gastroenterology | Admitting: Gastroenterology

## 2019-03-11 DIAGNOSIS — Z01812 Encounter for preprocedural laboratory examination: Secondary | ICD-10-CM | POA: Diagnosis not present

## 2019-03-11 DIAGNOSIS — Z20828 Contact with and (suspected) exposure to other viral communicable diseases: Secondary | ICD-10-CM | POA: Insufficient documentation

## 2019-03-11 NOTE — Telephone Encounter (Signed)
Left message and asking pt to call back and discuss home sleep study and benefits. Beth

## 2019-03-12 ENCOUNTER — Encounter: Payer: Self-pay | Admitting: Nurse Practitioner

## 2019-03-12 DIAGNOSIS — Z8742 Personal history of other diseases of the female genital tract: Secondary | ICD-10-CM | POA: Insufficient documentation

## 2019-03-12 DIAGNOSIS — R1031 Right lower quadrant pain: Secondary | ICD-10-CM | POA: Insufficient documentation

## 2019-03-12 LAB — SARS CORONAVIRUS 2 (TAT 6-24 HRS): SARS Coronavirus 2: NEGATIVE

## 2019-03-13 MED ORDER — LACTATED RINGERS IV SOLN: 1000.0000 mL | Freq: Once | INTRAVENOUS | Status: DC

## 2019-03-13 MED ORDER — SODIUM CHLORIDE 0.9 % IV SOLN
INTRAVENOUS | Status: DC
  Administered 2019-03-14: 11:00:00 via INTRAVENOUS

## 2019-03-14 ENCOUNTER — Ambulatory Visit: Payer: BC Managed Care – PPO | Admitting: Certified Registered Nurse Anesthetist

## 2019-03-14 ENCOUNTER — Other Ambulatory Visit: Payer: Self-pay

## 2019-03-14 ENCOUNTER — Encounter: Payer: Self-pay | Admitting: Certified Registered Nurse Anesthetist

## 2019-03-14 ENCOUNTER — Encounter
Admission: RE | Disposition: A | Discharge status: Home / Self Care | Payer: Self-pay | Source: Home / Self Care | Attending: Gastroenterology

## 2019-03-14 ENCOUNTER — Ambulatory Visit
Admission: RE | Admit: 2019-03-14 | Discharge: 2019-03-14 | Disposition: A | Discharge status: Home / Self Care | Payer: BC Managed Care – PPO | Attending: Gastroenterology | Admitting: Gastroenterology

## 2019-03-14 DIAGNOSIS — Z79899 Other long term (current) drug therapy: Secondary | ICD-10-CM | POA: Insufficient documentation

## 2019-03-14 DIAGNOSIS — Z800 Family history of malignant neoplasm of digestive organs: Secondary | ICD-10-CM | POA: Diagnosis not present

## 2019-03-14 DIAGNOSIS — Z6841 Body Mass Index (BMI) 40.0 and over, adult: Secondary | ICD-10-CM | POA: Diagnosis not present

## 2019-03-14 DIAGNOSIS — F329 Major depressive disorder, single episode, unspecified: Secondary | ICD-10-CM | POA: Insufficient documentation

## 2019-03-14 DIAGNOSIS — K644 Residual hemorrhoidal skin tags: Secondary | ICD-10-CM | POA: Diagnosis not present

## 2019-03-14 DIAGNOSIS — K219 Gastro-esophageal reflux disease without esophagitis: Secondary | ICD-10-CM | POA: Diagnosis not present

## 2019-03-14 DIAGNOSIS — I10 Essential (primary) hypertension: Secondary | ICD-10-CM | POA: Diagnosis not present

## 2019-03-14 DIAGNOSIS — G8929 Other chronic pain: Secondary | ICD-10-CM | POA: Diagnosis not present

## 2019-03-14 DIAGNOSIS — K5730 Diverticulosis of large intestine without perforation or abscess without bleeding: Secondary | ICD-10-CM | POA: Diagnosis not present

## 2019-03-14 DIAGNOSIS — D124 Benign neoplasm of descending colon: Secondary | ICD-10-CM | POA: Insufficient documentation

## 2019-03-14 DIAGNOSIS — G4730 Sleep apnea, unspecified: Secondary | ICD-10-CM | POA: Insufficient documentation

## 2019-03-14 DIAGNOSIS — F419 Anxiety disorder, unspecified: Secondary | ICD-10-CM | POA: Diagnosis not present

## 2019-03-14 DIAGNOSIS — D125 Benign neoplasm of sigmoid colon: Secondary | ICD-10-CM | POA: Insufficient documentation

## 2019-03-14 DIAGNOSIS — K635 Polyp of colon: Secondary | ICD-10-CM | POA: Diagnosis not present

## 2019-03-14 DIAGNOSIS — Z1211 Encounter for screening for malignant neoplasm of colon: Secondary | ICD-10-CM

## 2019-03-14 DIAGNOSIS — D126 Benign neoplasm of colon, unspecified: Secondary | ICD-10-CM | POA: Diagnosis not present

## 2019-03-14 DIAGNOSIS — K5790 Diverticulosis of intestine, part unspecified, without perforation or abscess without bleeding: Secondary | ICD-10-CM | POA: Diagnosis not present

## 2019-03-14 HISTORY — PX: COLONOSCOPY WITH PROPOFOL: SHX5780

## 2019-03-14 SURGERY — COLONOSCOPY WITH PROPOFOL
Anesthesia: General

## 2019-03-14 MED ORDER — PROPOFOL 500 MG/50ML IV EMUL
INTRAVENOUS | Status: DC | PRN
  Administered 2019-03-14: 175 ug/kg/min via INTRAVENOUS

## 2019-03-14 MED ORDER — PROPOFOL 10 MG/ML IV BOLUS
INTRAVENOUS | Status: DC | PRN
  Administered 2019-03-14: 60 mg via INTRAVENOUS

## 2019-03-14 MED ORDER — LIDOCAINE HCL (CARDIAC) PF 100 MG/5ML IV SOSY
PREFILLED_SYRINGE | INTRAVENOUS | Status: DC | PRN
  Administered 2019-03-14: 50 mg via INTRAVENOUS

## 2019-03-14 MED ORDER — PROPOFOL 500 MG/50ML IV EMUL
INTRAVENOUS | Status: AC
  Filled 2019-03-14: qty 50

## 2019-03-14 NOTE — Transfer of Care (Signed)
Immediate Anesthesia Transfer of Care Note  Patient: Candace Clayton  Procedure(s) Performed: COLONOSCOPY WITH PROPOFOL (N/A )  Patient Location: PACU  Anesthesia Type:General  Level of Consciousness: awake, alert  and oriented  Airway & Oxygen Therapy: Patient Spontanous Breathing and supernova  Post-op Assessment: Report given to RN and Post -op Vital signs reviewed and stable  Post vital signs: Reviewed and stable  Last Vitals:  Vitals Value Taken Time  BP 116/73 03/14/19 1109  Temp 36.1 C 03/14/19 1109  Pulse 74 03/14/19 1109  Resp 22 03/14/19 1109  SpO2 100 % 03/14/19 1109  Vitals shown include unvalidated device data.  Last Pain:  Vitals:   03/14/19 1011  TempSrc: Tympanic         Complications: No apparent anesthesia complications

## 2019-03-14 NOTE — Op Note (Signed)
[facility] Gastroenterology Patient Name: Candace Clayton Procedure Date: 03/14/2019 10:16 AM MRN: [id number] Account #: [id number] Date of Birth: [birth year] Admit Type: Outpatient Age: 58 Room: [facility] ENDO ROOM 1 Gender: Female Note Status: Finalized Procedure:            Colonoscopy Indications:          Screening in patient at increased risk: Colorectal                        cancer in mother before age 77, Screening in patient at                        increased risk: Colorectal cancer in brother before age                        44, Last colonoscopy: April 2015 Providers:            Lin Landsman MD, MD Referring MD:         Leretha Pol Medicines:            Monitored Anesthesia Care Complications:        No immediate complications. Estimated blood loss: None. Procedure:            Pre-Anesthesia Assessment:                       - Prior to the procedure, a History and Physical was                        performed, and patient medications and allergies were                        reviewed. The patient is competent. The risks and                        benefits of the procedure and the sedation options and                        risks were discussed with the patient. All questions                        were answered and informed consent was obtained.                        Patient identification and proposed procedure were                        verified by the physician, the nurse, the                        anesthesiologist, the anesthetist and the technician in                        the pre-procedure area in the procedure room in the                        endoscopy suite. Mental Status Examination: alert and                        oriented. Airway Examination: normal oropharyngeal  airway and neck mobility. Respiratory Examination:                        clear to auscultation. CV Examination: normal.   Prophylactic Antibiotics: The patient does not require                        prophylactic antibiotics. Prior Anticoagulants: The                        patient has taken no previous anticoagulant or                        antiplatelet agents. ASA Grade Assessment: III - A                        patient with severe systemic disease. After reviewing                        the risks and benefits, the patient was deemed in                        satisfactory condition to undergo the procedure. The                        anesthesia plan was to use monitored anesthesia care                        (MAC). Immediately prior to administration of                        medications, the patient was re-assessed for adequacy                        to receive sedatives. The heart rate, respiratory rate,                        oxygen saturations, blood pressure, adequacy of                        pulmonary ventilation, and response to care were                        monitored throughout the procedure. The physical status                        of the patient was re-assessed after the procedure.                       After obtaining informed consent, the colonoscope was                        passed under direct vision. Throughout the procedure,                        the patient's blood pressure, pulse, and oxygen                        saturations were monitored continuously. The  Colonoscope was introduced through the anus and                        advanced to the the terminal ileum, with identification                        of the appendiceal orifice and IC valve. The                        colonoscopy was technically difficult and complex due                        to significant looping and the patient's body habitus.                        Successful completion of the procedure was aided by                        applying abdominal pressure. The patient tolerated the                         procedure well. The quality of the bowel preparation                        was evaluated using the BBPS [facility] Bowel Preparation                        Scale) with scores of: Right Colon = 3, Transverse                        Colon = 3 and Left Colon = 3 (entire mucosa seen well                        with no residual staining, small fragments of stool or                        opaque liquid). The total BBPS score equals 9. Findings:      The perianal and digital rectal examinations were normal. Pertinent       negatives include normal sphincter tone and no palpable rectal lesions.      The terminal ileum appeared normal.      Four sessile polyps were found in the sigmoid colon (1) and descending       colon (3). The polyps were 4 to 5 mm in size. These polyps were removed       with a cold snare. Resection and retrieval were complete.      A few diverticula were found in the sigmoid colon and descending colon.      The retroflexed view of the distal rectum and anal verge was normal and       showed no anal or rectal abnormalities.      Skin tags were found on perianal exam. Impression:           - The examined portion of the ileum was normal.                       - Four 4 to 5 mm polyps in the sigmoid colon and in the  descending colon, removed with a cold snare. Resected                        and retrieved.                       - Diverticulosis in the sigmoid colon and in the                        descending colon.                       - The distal rectum and anal verge are normal on                        retroflexion view.                       - Perianal skin tags found on perianal exam. Recommendation:       - Discharge patient to home (with escort).                       - Resume previous diet today.                       - Continue present medications.                       - Await pathology results.                       - Repeat  colonoscopy in 5 years for surveillance. Procedure Code(s):    --- Professional ---                       [phone], Colonoscopy, flexible; with removal of tumor(s),                        polyp(s), or other lesion(s) by snare technique Diagnosis Code(s):    --- Professional ---                       K63.5, Polyp of colon                       K64.4, Residual hemorrhoidal skin tags                       Z80.0, Family history of malignant neoplasm of                        digestive organs                       K57.30, Diverticulosis of large intestine without                        perforation or abscess without bleeding CPT copyright 2019 American Medical Association. All rights reserved. The codes documented in this report are preliminary and upon coder review may  be revised to meet current compliance requirements. Dr. Ulyess Mort Lin Landsman MD, MD 03/14/2019 11:04:48 AM This report has been signed electronically. Number of Addenda: 0 Note Initiated On: 03/14/2019 10:16 AM Scope Withdrawal Time: 0 hours  13 minutes 47 seconds  Total Procedure Duration: 0 hours 18 minutes 47 seconds  Estimated Blood Loss: Estimated blood loss: none.      [facility]

## 2019-03-14 NOTE — Anesthesia Postprocedure Evaluation (Signed)
Anesthesia Post Note  Patient: Candace Clayton  Procedure(s) Performed: COLONOSCOPY WITH PROPOFOL (N/A )  Patient location during evaluation: Endoscopy Anesthesia Type: General Level of consciousness: awake and alert Pain management: pain level controlled Vital Signs Assessment: post-procedure vital signs reviewed and stable Respiratory status: spontaneous breathing, nonlabored ventilation, respiratory function stable and patient connected to nasal cannula oxygen Cardiovascular status: blood pressure returned to baseline and stable Postop Assessment: no apparent nausea or vomiting Anesthetic complications: no     Last Vitals:  Vitals:   03/14/19 1011 03/14/19 1109  BP: (!) 161/91 116/73  Pulse: 70   Resp: 16   Temp: (!) 36 C (!) 36.1 C  SpO2: 99%     Last Pain:  Vitals:   03/14/19 1150  TempSrc:   PainSc: 0-No pain                 Karie Skowron S

## 2019-03-14 NOTE — Anesthesia Preprocedure Evaluation (Signed)
Anesthesia Evaluation  Patient identified by MRN, date of birth, ID band Patient awake    Reviewed: Allergy & Precautions, NPO status , Patient's Chart, lab work & pertinent test results, reviewed documented beta blocker date and time   Airway Mallampati: III  TM Distance: >3 FB     Dental  (+) Chipped   Pulmonary sleep apnea and Continuous Positive Airway Pressure Ventilation ,           Cardiovascular hypertension, Pt. on medications + angina      Neuro/Psych PSYCHIATRIC DISORDERS Anxiety Depression  Neuromuscular disease    GI/Hepatic GERD  ,  Endo/Other  Morbid obesity  Renal/GU      Musculoskeletal  (+) Arthritis ,   Abdominal   Peds  Hematology   Anesthesia Other Findings Chronic pain.  Reproductive/Obstetrics                             Anesthesia Physical Anesthesia Plan  ASA: IV  Anesthesia Plan: General   Post-op Pain Management:    Induction: Intravenous  PONV Risk Score and Plan:   Airway Management Planned:   Additional Equipment:   Intra-op Plan:   Post-operative Plan:   Informed Consent: I have reviewed the patients History and Physical, chart, labs and discussed the procedure including the risks, benefits and alternatives for the proposed anesthesia with the patient or authorized representative who has indicated his/her understanding and acceptance.       Plan Discussed with: CRNA  Anesthesia Plan Comments:         Anesthesia Quick Evaluation

## 2019-03-14 NOTE — Anesthesia Post-op Follow-up Note (Signed)
Anesthesia QCDR form completed.        

## 2019-03-14 NOTE — Anesthesia Procedure Notes (Signed)
Date/Time: 03/14/2019 10:38 AM Performed by: Johnna Acosta, CRNA Pre-anesthesia Checklist: Patient identified, Emergency Drugs available, Suction available, Patient being monitored and Timeout performed Patient Re-evaluated:Patient Re-evaluated prior to induction Oxygen Delivery Method: Supernova nasal CPAP Preoxygenation: Pre-oxygenation with 100% oxygen Induction Type: IV induction

## 2019-03-14 NOTE — H&P (Signed)
Cephas Darby, MD [address]  [place]  [place], [place] [address]  Main: [phone]  Fax: [phone] Pager: [phone]  Primary Care Physician:  Ronnell Freshwater, NP Primary Gastroenterologist:  Dr. Cephas Darby  Pre-Procedure History & Physical: HPI:  Candace Clayton is a 58 y.o. female is here for an colonoscopy.   Past Medical History:  Diagnosis Date  . Anginal pain (HCC)    tightness related to anxiety  . Anxiety   . Depression   . GERD (gastroesophageal reflux disease)    throws up easily but not diagnosed with reflux  . History of C-section 78  . Hypertension   . Sleep apnea    uses cpap    Past Surgical History:  Procedure Laterality Date  . CESAREAN SECTION  1992  . COLONOSCOPY     polyps removed first procedure. 2nd time all was clear  . FINGER SURGERY Left 2011   left finger cut off x 2.(only up to last digit)  . HEMATOMA EVACUATION Left 1999   upper part of foot was injured d/t 500lb weight landing on her foot.   Marland Kitchen LAPAROSCOPIC SALPINGO OOPHERECTOMY Left 04/02/2018   Procedure: LAPAROSCOPIC SALPINGO OOPHORECTOMY;  Surgeon: Malachy Mood, MD;  Location: [facility] ORS;  Service: Gynecology;  Laterality: Left;    Prior to Admission medications   Medication Sig Start Date End Date Taking? Authorizing Provider  albuterol (VENTOLIN HFA) 108 (90 Base) MCG/ACT inhaler Inhale 2 puffs into the lungs every 6 (six) hours as needed for wheezing or shortness of breath. 01/23/19   Kendell Bane, NP  busPIRone (BUSPAR) 15 MG tablet Take 1 tablet (15 mg total) by mouth 3 (three) times daily. Take one tab po bid for one week, then increase to tid for anxiety 02/28/19   Ronnell Freshwater, NP  cetirizine (ZYRTEC) 10 MG tablet TAKE 1 TABLET(S) BY MOUTH DAILY FOR ALLERGIES 01/16/19   Ronnell Freshwater, NP  desvenlafaxine (PRISTIQ) 100 MG 24 hr tablet Take 1 tablet (100 mg total) by mouth daily. 02/28/19   Ronnell Freshwater, NP  enalapril (VASOTEC) 20 MG tablet  Take 1 tablet (20 mg total) by mouth 2 (two) times daily. 10/23/18   Ronnell Freshwater, NP  hydrochlorothiazide (HYDRODIURIL) 25 MG tablet Take 1 tablet (25 mg total) by mouth daily. 02/28/19   Ronnell Freshwater, NP  hydrOXYzine (ATARAX/VISTARIL) 10 MG tablet Take 1 tablet (10 mg total) by mouth 3 (three) times daily as needed. 07/15/18   Allyne Gee, MD  Multiple Vitamins-Calcium (ONE-A-DAY WOMENS PO) Take 1 tablet by mouth daily.    [provider]  NON FORMULARY cpap device    [provider]  OVER THE COUNTER MEDICATION 1 tablet daily as needed. Takes generic stool softener for constipation d/t oxycodone.    [provider]  oxyCODONE-acetaminophen (PERCOCET/ROXICET) 5-325 MG tablet Take 1-2 tablets by mouth daily as needed for severe pain. For chronic pain, max 35/month 04/04/19 05/04/19  Gillis Santa, MD    Allergies as of 03/05/2019 - Review Complete 03/05/2019  Allergen Reaction Noted  . Gabapentin Other (See Comments) 07/03/2017    Family History  Problem Relation Age of Onset  . Breast cancer Mother 40       x 3 times  . Colon cancer Mother   . Brain cancer Mother   . Cancer - Colon Mother   . Cancer - Other Mother   . Heart Problems Father   . Colon cancer Brother   .  Heart Problems Brother     Social History   Socioeconomic History  . Marital status: Divorced    Spouse name: Not on file  . Number of children: 1  . Years of education: Not on file  . Highest education level: Not on file  Occupational History  . Occupation: works in Secretary/administrator  Social Needs  . Financial resource strain: Not on file  . Food insecurity    Worry: Not on file    Inability: Not on file  . Transportation needs    Medical: Not on file    Non-medical: Not on file  Tobacco Use  . Smoking status: Never Smoker  . Smokeless tobacco: Never Used  Substance and Sexual Activity  . Alcohol use: No    Frequency: Never  . Drug use: No  . Sexual activity: Not  Currently    Birth control/protection: Post-menopausal  Lifestyle  . Physical activity    Days per week: Not on file    Minutes per session: Not on file  . Stress: Not on file  Relationships  . Social Herbalist on phone: Not on file    Gets together: Not on file    Attends religious service: Not on file    Active member of club or organization: Not on file    Attends meetings of clubs or organizations: Not on file    Relationship status: Not on file  . Intimate partner violence    Fear of current or ex partner: Not on file    Emotionally abused: Not on file    Physically abused: Not on file    Forced sexual activity: Not on file  Other Topics Concern  . Not on file  Social History Narrative   Son has schizophrenia and autism but is fully capable of helping mother after surgery    Review of Systems: See HPI, otherwise negative ROS  Physical Exam: BP 116/73   Pulse 70   Temp (!) 97 F (36.1 C)   Resp 16   Ht 5\' 9"  (1.753 m)   Wt (!) 161 kg   SpO2 99%   BMI 52.42 kg/m  General:   Alert,  pleasant and cooperative in NAD Head:  Normocephalic and atraumatic. Neck:  Supple; no masses or thyromegaly. Lungs:  Clear throughout to auscultation.    Heart:  Regular rate and rhythm. Abdomen:  Soft, nontender and nondistended. Normal bowel sounds, without guarding, and without rebound.   Neurologic:  Alert and  oriented x4;  grossly normal neurologically.  Impression/Plan: Candace Clayton is here for an colonoscopy to be performed for colon cancer screening  Risks, benefits, limitations, and alternatives regarding  colonoscopy have been reviewed with the patient.  Questions have been answered.  All parties agreeable.   Sherri Sear, MD  03/14/2019, 11:11 AM

## 2019-03-17 ENCOUNTER — Encounter: Payer: Self-pay | Admitting: Gastroenterology

## 2019-03-17 LAB — SURGICAL PATHOLOGY

## 2019-03-19 ENCOUNTER — Other Ambulatory Visit: Payer: Self-pay

## 2019-03-19 ENCOUNTER — Encounter (INDEPENDENT_AMBULATORY_CARE_PROVIDER_SITE_OTHER): Payer: BC Managed Care – PPO | Admitting: Internal Medicine

## 2019-03-19 ENCOUNTER — Other Ambulatory Visit (INDEPENDENT_AMBULATORY_CARE_PROVIDER_SITE_OTHER): Payer: BC Managed Care – PPO | Admitting: Internal Medicine

## 2019-03-19 DIAGNOSIS — G4733 Obstructive sleep apnea (adult) (pediatric): Secondary | ICD-10-CM | POA: Diagnosis not present

## 2019-03-20 ENCOUNTER — Other Ambulatory Visit: Payer: Self-pay

## 2019-03-20 MED ORDER — BUDESONIDE-FORMOTEROL FUMARATE 160-4.5 MCG/ACT IN AERO: 2.0000 | INHALATION_SPRAY | Freq: Two times a day (BID) | RESPIRATORY_TRACT | 3 refills | Status: DC

## 2019-03-24 ENCOUNTER — Encounter: Payer: Self-pay | Admitting: Student in an Organized Health Care Education/Training Program

## 2019-03-24 ENCOUNTER — Ambulatory Visit
Admission: RE | Admit: 2019-03-24 | Discharge: 2019-03-24 | Disposition: A | Discharge status: Home / Self Care | Payer: BC Managed Care – PPO | Source: Ambulatory Visit | Attending: Student in an Organized Health Care Education/Training Program | Admitting: Student in an Organized Health Care Education/Training Program

## 2019-03-24 ENCOUNTER — Other Ambulatory Visit: Payer: Self-pay

## 2019-03-24 ENCOUNTER — Ambulatory Visit (HOSPITAL_BASED_OUTPATIENT_CLINIC_OR_DEPARTMENT_OTHER): Payer: BC Managed Care – PPO | Admitting: Student in an Organized Health Care Education/Training Program

## 2019-03-24 VITALS — BP 137/71 | HR 76 | Temp 98.2°F | Resp 18 | Ht 69.0 in | Wt 350.0 lb

## 2019-03-24 DIAGNOSIS — M5440 Lumbago with sciatica, unspecified side: Secondary | ICD-10-CM

## 2019-03-24 DIAGNOSIS — M47816 Spondylosis without myelopathy or radiculopathy, lumbar region: Secondary | ICD-10-CM | POA: Insufficient documentation

## 2019-03-24 MED ORDER — LIDOCAINE HCL 2 % IJ SOLN
20.0000 mL | Freq: Once | INTRAMUSCULAR | Status: AC
  Administered 2019-03-24: 400 mg

## 2019-03-24 MED ORDER — FENTANYL CITRATE (PF) 100 MCG/2ML IJ SOLN
INTRAMUSCULAR | Status: AC
  Filled 2019-03-24: qty 2

## 2019-03-24 MED ORDER — IOHEXOL 180 MG/ML  SOLN: 10.0000 mL | Freq: Once | INTRAMUSCULAR | Status: DC

## 2019-03-24 MED ORDER — LIDOCAINE HCL 2 % IJ SOLN
INTRAMUSCULAR | Status: AC
  Filled 2019-03-24: qty 20

## 2019-03-24 MED ORDER — DEXAMETHASONE SODIUM PHOSPHATE 10 MG/ML IJ SOLN
INTRAMUSCULAR | Status: AC
  Filled 2019-03-24: qty 2

## 2019-03-24 MED ORDER — ROPIVACAINE HCL 2 MG/ML IJ SOLN
2.0000 mL | Freq: Once | INTRAMUSCULAR | Status: AC
  Administered 2019-03-24: 2 mL via EPIDURAL

## 2019-03-24 MED ORDER — ROPIVACAINE HCL 2 MG/ML IJ SOLN
INTRAMUSCULAR | Status: AC
  Filled 2019-03-24: qty 10

## 2019-03-24 MED ORDER — FENTANYL CITRATE (PF) 100 MCG/2ML IJ SOLN
25.0000 ug | INTRAMUSCULAR | Status: DC | PRN
  Administered 2019-03-24: 50 ug via INTRAVENOUS

## 2019-03-24 MED ORDER — ROPIVACAINE HCL 2 MG/ML IJ SOLN
1.0000 mL | Freq: Once | INTRAMUSCULAR | Status: AC
  Administered 2019-03-24: 1 mL via EPIDURAL

## 2019-03-24 MED ORDER — DEXAMETHASONE SODIUM PHOSPHATE 10 MG/ML IJ SOLN
10.0000 mg | Freq: Once | INTRAMUSCULAR | Status: AC
  Administered 2019-03-24: 10:00:00 10 mg

## 2019-03-24 MED ORDER — OXYCODONE-ACETAMINOPHEN 5-325 MG PO TABS: 1.0000 | ORAL_TABLET | Freq: Four times a day (QID) | ORAL | 0 refills | Status: DC | PRN

## 2019-03-24 MED ORDER — DEXAMETHASONE SODIUM PHOSPHATE 10 MG/ML IJ SOLN
10.0000 mg | Freq: Once | INTRAMUSCULAR | Status: AC
  Administered 2019-03-24: 10 mg

## 2019-03-24 NOTE — Progress Notes (Signed)
Safety precautions to be maintained throughout the outpatient stay will include: orient to surroundings, keep bed in low position, maintain call bell within reach at all times, provide assistance with transfer out of bed and ambulation.  

## 2019-03-24 NOTE — Progress Notes (Signed)
Patient's Name: Candace Clayton  MRN: [id number]  Referring Provider: Ronnell Freshwater, NP  DOB: Apr 14, 1961  PCP: Ronnell Freshwater, NP  DOS: 03/24/2019  Note by: Gillis Santa, MD  Service setting: Ambulatory outpatient  Specialty: Interventional Pain Management  Patient type: Established  Location: [facility] (AMB) Pain Management Facility  Visit type: Interventional Procedure   Primary Reason for Visit: Interventional Pain Management Treatment. CC: Back Pain  Procedure:          Anesthesia, Analgesia, Anxiolysis:  Type: Lumbar Facet, Medial Branch Block(s) #2  Primary Purpose: Diagnostic Region: Posterolateral Lumbosacral Spine Level:  L3, L4, L5, & S1 Medial Branch Level(s). Injecting these levels blocks the L3-4, L4-5, and L5-S1 lumbar facet joints. Laterality: Bilateral   (s/p R L3,4,5,S1 06/2018, bilateral done 12/2018)  Type: Moderate (Conscious) Sedation combined with Local Anesthesia Indication(s): Analgesia and Anxiety Route: Intravenous (IV) IV Access: Secured Sedation: Meaningful verbal contact was maintained at all times during the procedure  Local Anesthetic: Lidocaine 1-2%  Position: Prone   Indications: 1. Lumbar facet arthropathy (R>L)   2. Lumbar spondylosis   3. Low back pain with sciatica, sciatica laterality unspecified, unspecified back pain laterality, unspecified chronicity    Pain Score: Pre-procedure: 8 /10 Post-procedure: 0-No pain/10  Pre-op Assessment:  Candace Clayton is a 58 y.o. (year old), female patient, seen today for interventional treatment. She  has a past surgical history that includes Cesarean section (1992); Finger surgery (Left, 2011); Hematoma evacuation (Left, 1999); Colonoscopy; Laparoscopic salpingo oophorectomy (Left, 04/02/2018); and Colonoscopy with propofol (N/A, 03/14/2019). Candace Clayton has a current medication list which includes the following prescription(s): albuterol, budesonide-formoterol, buspirone, cetirizine, desvenlafaxine, enalapril,  hydrochlorothiazide, hydroxyzine, multiple vitamins-minerals, NON FORMULARY, OVER THE COUNTER MEDICATION, and oxycodone-acetaminophen, and the following Facility-Administered Medications: fentanyl and iohexol. Her primarily concern today is the Back Pain  Initial Vital Signs:  Pulse/HCG Rate: 71ECG Heart Rate: 76 Temp: 98.1 F (36.7 C) Resp: 16 BP: (!) 157/84 SpO2: 100 %  BMI: Estimated body mass index is 51.69 kg/m as calculated from the following:   Height as of this encounter: 5\' 9"  (1.753 m).   Weight as of this encounter: 350 lb (158.8 kg).  Risk Assessment: Allergies: Reviewed. She is allergic to gabapentin.  Allergy Precautions: None required Coagulopathies: Reviewed. None identified.  Blood-thinner therapy: None at this time Active Infection(s): Reviewed. None identified. Candace Clayton is afebrile  Site Confirmation: Candace Clayton was asked to confirm the procedure and laterality before marking the site Procedure checklist: Completed Consent: Before the procedure and under the influence of no sedative(s), amnesic(s), or anxiolytics, the patient was informed of the treatment options, risks and possible complications. To fulfill our ethical and legal obligations, as recommended by the American Medical Association's Code of Ethics, I have informed the patient of my clinical impression; the nature and purpose of the treatment or procedure; the risks, benefits, and possible complications of the intervention; the alternatives, including doing nothing; the risk(s) and benefit(s) of the alternative treatment(s) or procedure(s); and the risk(s) and benefit(s) of doing nothing. The patient was provided information about the general risks and possible complications associated with the procedure. These may include, but are not limited to: failure to achieve desired goals, infection, bleeding, organ or nerve damage, allergic reactions, paralysis, and death. In addition, the patient was informed of  those risks and complications associated to Spine-related procedures, such as failure to decrease pain; infection (i.e.: Meningitis, epidural or intraspinal abscess); bleeding (i.e.: epidural hematoma, subarachnoid hemorrhage, or any other type  of intraspinal or peri-dural bleeding); organ or nerve damage (i.e.: Any type of peripheral nerve, nerve root, or spinal cord injury) with subsequent damage to sensory, motor, and/or autonomic systems, resulting in permanent pain, numbness, and/or weakness of one or several areas of the body; allergic reactions; (i.e.: anaphylactic reaction); and/or death. Furthermore, the patient was informed of those risks and complications associated with the medications. These include, but are not limited to: allergic reactions (i.e.: anaphylactic or anaphylactoid reaction(s)); adrenal axis suppression; blood sugar elevation that in diabetics may result in ketoacidosis or comma; water retention that in patients with history of congestive heart failure may result in shortness of breath, pulmonary edema, and decompensation with resultant heart failure; weight gain; swelling or edema; medication-induced neural toxicity; particulate matter embolism and blood vessel occlusion with resultant organ, and/or nervous system infarction; and/or aseptic necrosis of one or more joints. Finally, the patient was informed that Medicine is not an exact science; therefore, there is also the possibility of unforeseen or unpredictable risks and/or possible complications that may result in a catastrophic outcome. The patient indicated having understood very clearly. We have given the patient no guarantees and we have made no promises. Enough time was given to the patient to ask questions, all of which were answered to the patient's satisfaction. Candace Clayton has indicated that she wanted to continue with the procedure. Attestation: I, the ordering provider, attest that I have discussed with the patient the  benefits, risks, side-effects, alternatives, likelihood of achieving goals, and potential problems during recovery for the procedure that I have provided informed consent. Date  Time: 03/24/2019  8:57 AM  Pre-Procedure Preparation:  Monitoring: As per clinic protocol. Respiration, ETCO2, SpO2, BP, heart rate and rhythm monitor placed and checked for adequate function Safety Precautions: Patient was assessed for positional comfort and pressure points before starting the procedure. Time-out: I initiated and conducted the "Time-out" before starting the procedure, as per protocol. The patient was asked to participate by confirming the accuracy of the "Time Out" information. Verification of the correct person, site, and procedure were performed and confirmed by me, the nursing staff, and the patient. "Time-out" conducted as per Joint Commission's Universal Protocol (UP.01.01.01). Time: 1012  Description of Procedure:          Laterality: Bilateral. The procedure was performed in identical fashion on both sides. Levels:  L3, L4, L5, & S1 Medial Branch Level(s) Area Prepped: Posterior Lumbosacral Region Prepping solution: ChloraPrep (2% chlorhexidine gluconate and 70% isopropyl alcohol) Safety Precautions: Aspiration looking for blood return was conducted prior to all injections. At no point did we inject any substances, as a needle was being advanced. Before injecting, the patient was told to immediately notify me if she was experiencing any new onset of "ringing in the ears, or metallic taste in the mouth". No attempts were made at seeking any paresthesias. Safe injection practices and needle disposal techniques used. Medications properly checked for expiration dates. SDV (single dose vial) medications used. After the completion of the procedure, all disposable equipment used was discarded in the proper designated medical waste containers. Local Anesthesia: Protocol guidelines were followed. The patient  was positioned over the fluoroscopy table. The area was prepped in the usual manner. The time-out was completed. The target area was identified using fluoroscopy. A 12-in long, straight, sterile hemostat was used with fluoroscopic guidance to locate the targets for each level blocked. Once located, the skin was marked with an approved surgical skin marker. Once all sites were marked, the  skin (epidermis, dermis, and hypodermis), as well as deeper tissues (fat, connective tissue and muscle) were infiltrated with a small amount of a short-acting local anesthetic, loaded on a 10cc syringe with a 25G, 1.5-in  Needle. An appropriate amount of time was allowed for local anesthetics to take effect before proceeding to the next step. Local Anesthetic: Lidocaine 2.0% The unused portion of the local anesthetic was discarded in the proper designated containers. Technical explanation of process:  L3 Medial Branch Nerve Block (MBB): The target area for the L3 medial branch is at the junction of the postero-lateral aspect of the superior articular process and the superior, posterior, and medial edge of the transverse process of L4. Under fluoroscopic guidance, a Quincke needle was inserted until contact was made with os over the superior postero-lateral aspect of the pedicular shadow (target area). After negative aspiration for blood, 1 mL of the nerve block solution was injected without difficulty or complication. The needle was removed intact. L4 Medial Branch Nerve Block (MBB): The target area for the L4 medial branch is at the junction of the postero-lateral aspect of the superior articular process and the superior, posterior, and medial edge of the transverse process of L5. Under fluoroscopic guidance, a Quincke needle was inserted until contact was made with os over the superior postero-lateral aspect of the pedicular shadow (target area). After negative aspiration for blood,1 mL of the nerve block solution was injected  without difficulty or complication. The needle was removed intact. L5 Medial Branch Nerve Block (MBB): The target area for the L5 medial branch is at the junction of the postero-lateral aspect of the superior articular process and the superior, posterior, and medial edge of the sacral ala. Under fluoroscopic guidance, a Quincke needle was inserted until contact was made with os over the superior postero-lateral aspect of the pedicular shadow (target area). After negative aspiration for blood, 22mL of the nerve block solution was injected without difficulty or complication. The needle was removed intact. S1 Medial Branch Nerve Block (MBB): The target area for the S1 medial branch is at the posterior and inferior 6 o'clock position of the L5-S1 facet joint. Under fluoroscopic guidance, the Quincke needle inserted for the L5 MBB was redirected until contact was made with os over the inferior and postero aspect of the sacrum, at the 6 o' clock position under the L5-S1 facet joint (Target area). After negative aspiration for blood, 82mL of the nerve block solution was injected without difficulty or complication. The needle was removed intact. Procedural Needles: 22-gauge, 5-inch, Quincke needles used for all levels. Nerve block solution: 10 cc solution made of 8 cc of 0.2% ropivacaine, 2 cc of Decadron 10 mg/cc. 1-1.5 cc injected at each level above bilaterally. The unused portion of the solution was discarded in the proper designated containers.  Once the entire procedure was completed, the treated area was cleaned, making sure to leave some of the prepping solution back to take advantage of its long term bactericidal properties.   Illustration of the posterior view of the lumbar spine and the posterior neural structures. Laminae of L2 through S1 are labeled. DPRL5, dorsal primary ramus of L5; DPRS1, dorsal primary ramus of S1; DPR3, dorsal primary ramus of L3; FJ, facet (zygapophyseal) joint L3-L4; I, inferior  articular process of L4; LB1, lateral branch of dorsal primary ramus of L1; IAB, inferior articular branches from L3 medial branch (supplies L4-L5 facet joint); IBP, intermediate branch plexus; MB3, medial branch of dorsal primary ramus of L3; NR3,  third lumbar nerve root; S, superior articular process of L5; SAB, superior articular branches from L4 (supplies L4-5 facet joint also); TP3, transverse process of L3.  Vitals:   03/24/19 1033 03/24/19 1043 03/24/19 1053 03/24/19 1103  BP: (!) 151/86 139/64 127/70 137/71  Pulse: 76     Resp: 18 16 (!) 24 18  Temp:  98.2 F (36.8 C)  98.2 F (36.8 C)  SpO2: 96% 99% 99% 100%  Weight:      Height:         Start Time: 1012 hrs. End Time: 1030 hrs.  Imaging Guidance (Spinal):          Type of Imaging Technique: Fluoroscopy Guidance (Spinal) Indication(s): Assistance in needle guidance and placement for procedures requiring needle placement in or near specific anatomical locations not easily accessible without such assistance. Exposure Time: Please see nurses notes. Contrast: None used. Fluoroscopic Guidance: I was personally present during the use of fluoroscopy. "Tunnel Vision Technique" used to obtain the best possible view of the target area. Parallax error corrected before commencing the procedure. "Direction-depth-direction" technique used to introduce the needle under continuous pulsed fluoroscopy. Once target was reached, antero-posterior, oblique, and lateral fluoroscopic projection used confirm needle placement in all planes. Images permanently stored in EMR. Interpretation: No contrast injected. I personally interpreted the imaging intraoperatively. Adequate needle placement confirmed in multiple planes. Permanent images saved into the patient's record.  Antibiotic Prophylaxis:   Anti-infectives (From admission, onward)   None     Indication(s): None identified  Post-operative Assessment:  Post-procedure Vital Signs:  Pulse/HCG  Rate: 7678 Temp: 98.2 F (36.8 C) Resp: 18 BP: 137/71 SpO2: 100 %  EBL: None  Complications: No immediate post-treatment complications observed by team, or reported by patient.  Note: The patient tolerated the entire procedure well. A repeat set of vitals were taken after the procedure and the patient was kept under observation following institutional policy, for this type of procedure. Post-procedural neurological assessment was performed, showing return to baseline, prior to discharge. The patient was provided with post-procedure discharge instructions, including a section on how to identify potential problems. Should any problems arise concerning this procedure, the patient was given instructions to immediately contact us, at any time, without hesitation. In any case, we plan to contact the patient by telephone for a follow-up status report regarding this interventional procedure.  Comments:  No additional relevant information.  Plan of Care   Imaging Orders     DG PAIN CLINIC C-ARM 1-60 MIN NO REPORT Procedure Orders    No procedure(s) ordered today   PMP checked and appropriate.  Patient's last fill of Percocet was 03/05/2019.  She says that given increased pain in her low back and her hips, she has had to take more Percocet than prescribed.  I had a discussion with the patient regarding this.  I informed her that she should only be taking this medication at night on days that she works.  We will provide her with a short prescription for acute postprocedural pain as below.  Patient endorsed understanding.   Requested Prescriptions   Signed Prescriptions Disp Refills  . oxyCODONE-acetaminophen (PERCOCET/ROXICET) 5-325 MG tablet 12 tablet 0    Sig: Take 1 tablet by mouth every 6 (six) hours as needed for up to 5 days for severe pain. For acute post-procedural pain     Medications ordered for procedure: Meds ordered this encounter  Medications  . iohexol (OMNIPAQUE) 180 MG/ML  injection 10 mL  Must be Myelogram-compatible. If not available, you may substitute with a water-soluble, non-ionic, hypoallergenic, myelogram-compatible radiological contrast medium.  Marland Kitchen lidocaine (XYLOCAINE) 2 % (with pres) injection 400 mg  . fentaNYL (SUBLIMAZE) injection 25-50 mcg    Make sure Narcan is available in the pyxis when using this medication. In the event of respiratory depression (RR< 8/min): Titrate NARCAN (naloxone) in increments of 0.1 to 0.2 mg IV at 2-3 minute intervals, until desired degree of reversal.  . dexamethasone (DECADRON) injection 10 mg  . ropivacaine (PF) 2 mg/mL (0.2%) (NAROPIN) injection 1 mL  . ropivacaine (PF) 2 mg/mL (0.2%) (NAROPIN) injection 2 mL  . dexamethasone (DECADRON) injection 10 mg  . oxyCODONE-acetaminophen (PERCOCET/ROXICET) 5-325 MG tablet    Sig: Take 1 tablet by mouth every 6 (six) hours as needed for up to 5 days for severe pain. For acute post-procedural pain    Dispense:  12 tablet    Refill:  0   Medications administered: We administered lidocaine, fentaNYL, dexamethasone, ropivacaine (PF) 2 mg/mL (0.2%), ropivacaine (PF) 2 mg/mL (0.2%), and dexamethasone.  See the medical record for exact dosing, route, and time of administration.  Disposition: Discharge home  Discharge Date & Time: 03/24/2019; 1107 hrs.   Physician-requested Follow-up: Return for Keep sch. appt.  Future Appointments  Date Time Provider Ethan  03/25/2019  3:10 PM Malachy Mood, MD [facility] None  04/03/2019 10:00 AM Allyne Gee, MD [facility] None  05/01/2019 11:30 AM Gillis Santa, MD [facility] None  06/02/2019  9:30 AM Ronnell Freshwater, NP [place] None   Primary Care Physician: Ronnell Freshwater, NP Location: [facility] Note by: Gillis Santa, MD Date: 03/24/2019; Time: 1:26 PM  Disclaimer:  Medicine is not an Chief Strategy Officer. The only guarantee in medicine is that nothing is guaranteed. It is important to note  that the decision to proceed with this intervention was based on the information collected from the patient. The Data and conclusions were drawn from the patient's questionnaire, the interview, and the physical examination. Because the information was provided in large part by the patient, it cannot be guaranteed that it has not been purposely or unconsciously manipulated. Every effort has been made to obtain as much relevant data as possible for this evaluation. It is important to note that the conclusions that lead to this procedure are derived in large part from the available data. Always take into account that the treatment will also be dependent on availability of resources and existing treatment guidelines, considered by other Pain Management Practitioners as being common knowledge and practice, at the time of the intervention. For Medico-Legal purposes, it is also important to point out that variation in procedural techniques and pharmacological choices are the acceptable norm. The indications, contraindications, technique, and results of the above procedure should only be interpreted and judged by a Board-Certified Interventional Pain Specialist with extensive familiarity and expertise in the same exact procedure and technique.

## 2019-03-25 ENCOUNTER — Encounter: Payer: Self-pay | Admitting: Obstetrics and Gynecology

## 2019-03-25 ENCOUNTER — Ambulatory Visit (INDEPENDENT_AMBULATORY_CARE_PROVIDER_SITE_OTHER): Payer: BC Managed Care – PPO | Admitting: Obstetrics and Gynecology

## 2019-03-25 ENCOUNTER — Telehealth: Payer: Self-pay

## 2019-03-25 VITALS — BP 156/97 | HR 83 | Ht 69.0 in | Wt 347.0 lb

## 2019-03-25 DIAGNOSIS — R102 Pelvic and perineal pain: Secondary | ICD-10-CM | POA: Diagnosis not present

## 2019-03-25 NOTE — Telephone Encounter (Signed)
No answer. Left message on Am to call if needed.

## 2019-03-25 NOTE — Procedures (Signed)
[facility] [place], [place] [address]  Sleep Specialist: Allyne Gee, MD [place] Sleep Study Interpretation  Patient Name: Candace Clayton Patient MR [id number] DOB:10/15/1960  Date of Study: March 19, 2019  Indications for study: Hypersomnia sleep apnea  BMI: 52.4 kg/m       Respiratory Data:  Total AHI: 29.8/h  Total Obstructive Apneas: 17  Total Central Apneas: 0  Total Mixed Apneas: 0  Total Hypopneas: 277  If the AHI is greater than 5 per hour patient qualifies for PAP evaluation  Oximetry Data:  Oxygen Desaturation Index: 45.4  Lowest Desaturation: 77%  Cardiac Data:  Minimum Heart Rate: 62  Maximum Heart Rate: 100   Impression / Diagnosis:  This apnea study is consistent with moderate to severe obstructive sleep apnea.  Patient also has severe oxygen desaturations noted.  A complete CPAP titration is recommended to determine the optimal response to CPAP therapy.  Patient also should be evaluated for supplemental oxygen.  GENERAL Recommendations:  1.  Consider Auto PAP with pressure ranges 5-20 cmH20 with download, or facility based PAP Titration Study  2.  Consider PAP interface mask fitted for patient comfort, Heated Humidification & PAP compliance monitoring (1 month, 3 months & 12 months after PAP initiation)  3. Consider treatment with mandibular advancement splint (MAS) or referral to an ENT surgeon for modification to the upper airway if the patient prefers an alternate therapy or the PAP trial is unsuccessful  4. Sleep hygiene measures should be discussed with the patient  5. Behavioral therapy such as weight reduction or smoking cessation as appropriate for the patient  6. Advise patient against the use of alcohol or sedatives in so much as these substances can worsen excessive daytime sleepiness and respiratory disturbances of sleep  7. Advise patient against participating in potentially  dangerous activities while drowsy such as operating a motor vehicle, heavy equipment or power tools as it can put them and others in danger  8. Advise patient of the long term consequences of OSA if left untreated, need for treatment and close follow up  9. Clinical follow up as deemed necessary     This Level III home sleep study was performed using the [employer], a 4 channel screening device subject to limitations. Depending on actual total sleep time, not measured in this study, the AHI (sum of apneas and hypopneas/hr of sleep) and therefore the severity of sleep apnea may be underestimated. As with any single night study, including Level 1 attended PSG, severity of sleep apnea may also be underestimated due to the lack of supine and/or REM sleep.  The interpretation associated with this report is based on normal values and degrees of severity in accordance with AASM parameters and/or estimated from multiple sources in the literature for adults ages 37-80+. These may not agree with the displayed values. The patient's treating physician should use the interpretation and recommendations in conjunction with the overall clinical evaluation and treatment of the patient.  Some of the terminology used in this scored [facility] report was developed several years ago and may not always be in accordance with current nomenclature. This in no way affects the accuracy of the data or the reliability of the interpretation and recommendations.

## 2019-03-25 NOTE — Progress Notes (Signed)
Gynecology Pelvic Pain Evaluation   Chief Complaint:  Chief Complaint  Candace Clayton presents with  . right side pain    pain sporadic x4-6 months    History of Present Illness:   Candace Clayton is a 58 y.o. G2P2000 who LMP was No LMP recorded. Candace Clayton is postmenopausal., presents today for a problem visit.  She complains of right pelvic pain over the past several months.   Sharp and stabbing in quality and its severity is described as severe. The pain radiates to the  Non-radiating.  Also has a past medical history of back pain and is getting steroid injections. Underwent LSO 04/02/2018 for ovarian cyst and pain with pathology showing serous cystadenoma.  Right salpingectomy has been planned at that time but secondary to significant central obesity visualization was inadequate and this was aborted.    No associated bowl or bladder symptoms.   Candace Clayton has these modifiers which include rest that make it better and activity that make it worse. No fevers, chills or vaginal bleeding.   Previous evaluation: no recent pelvic imaging  Review of Systems: Review of Systems  Constitutional: Negative.   Gastrointestinal: Positive for abdominal pain. Negative for blood in stool, constipation, diarrhea, melena, nausea and vomiting.  Genitourinary: Negative.   Musculoskeletal: Positive for back pain.    Past Medical History:  Past Medical History:  Diagnosis Date  . Anginal pain (HCC)    tightness related to anxiety  . Anxiety   . Depression   . GERD (gastroesophageal reflux disease)    throws up easily but not diagnosed with reflux  . History of C-section 75  . Hypertension   . Sleep apnea    uses cpap    Past Surgical History:  Past Surgical History:  Procedure Laterality Date  . CESAREAN SECTION  1992  . COLONOSCOPY     polyps removed first procedure. 2nd time all was clear  . COLONOSCOPY WITH PROPOFOL N/A 03/14/2019   Procedure: COLONOSCOPY WITH PROPOFOL;  Surgeon: Lin Landsman, MD;   Location: [facility] ENDOSCOPY;  Service: Gastroenterology;  Laterality: N/A;  . FINGER SURGERY Left 2011   left finger cut off x 2.(only up to last digit)  . HEMATOMA EVACUATION Left 1999   upper part of foot was injured d/t 500lb weight landing on her foot.   Marland Kitchen LAPAROSCOPIC SALPINGO OOPHERECTOMY Left 04/02/2018   Procedure: LAPAROSCOPIC SALPINGO OOPHORECTOMY;  Surgeon: Malachy Mood, MD;  Location: [facility] ORS;  Service: Gynecology;  Laterality: Left;    Gynecologic History:  No LMP recorded. Candace Clayton is postmenopausal.  Obstetric History: G2P2000  Family History:  Family History  Problem Relation Age of Onset  . Breast cancer Mother 40       x 3 times  . Colon cancer Mother   . Brain cancer Mother   . Cancer - Colon Mother   . Cancer - Other Mother   . Heart Problems Father   . Colon cancer Brother   . Heart Problems Brother     Social History:  Social History   Socioeconomic History  . Marital status: Divorced    Spouse name: Not on file  . Number of children: 1  . Years of education: Not on file  . Highest education level: Not on file  Occupational History  . Occupation: works in Secretary/administrator  Social Needs  . Financial resource strain: Not on file  . Food insecurity    Worry: Not on file    Inability: Not on file  .  Transportation needs    Medical: Not on file    Non-medical: Not on file  Tobacco Use  . Smoking status: Never Smoker  . Smokeless tobacco: Never Used  Substance and Sexual Activity  . Alcohol use: No    Frequency: Never  . Drug use: No  . Sexual activity: Not Currently    Birth control/protection: Post-menopausal  Lifestyle  . Physical activity    Days per week: Not on file    Minutes per session: Not on file  . Stress: Not on file  Relationships  . Social Herbalist on phone: Not on file    Gets together: Not on file    Attends religious service: Not on file    Active member of club or organization: Not on file    Attends  meetings of clubs or organizations: Not on file    Relationship status: Not on file  . Intimate partner violence    Fear of current or ex partner: Not on file    Emotionally abused: Not on file    Physically abused: Not on file    Forced sexual activity: Not on file  Other Topics Concern  . Not on file  Social History Narrative   Son has schizophrenia and autism but is fully capable of helping mother after surgery    Allergies:  Allergies  Allergen Reactions  . Gabapentin Other (See Comments)    Unknown    Medications: Prior to Admission medications   Medication Sig Start Date End Date Taking? Authorizing Provider  albuterol (VENTOLIN HFA) 108 (90 Base) MCG/ACT inhaler Inhale 2 puffs into the lungs every 6 (six) hours as needed for wheezing or shortness of breath. 01/23/19  Yes Scarboro, Audie Clear, NP  budesonide-formoterol (SYMBICORT) 160-4.5 MCG/ACT inhaler Inhale 2 puffs into the lungs 2 (two) times daily. 03/20/19  Yes Scarboro, Audie Clear, NP  busPIRone (BUSPAR) 15 MG tablet Take 1 tablet (15 mg total) by mouth 3 (three) times daily. Take one tab po bid for one week, then increase to tid for anxiety 02/28/19  Yes Boscia, Heather E, NP  cetirizine (ZYRTEC) 10 MG tablet TAKE 1 TABLET(S) BY MOUTH DAILY FOR ALLERGIES 01/16/19  Yes Boscia, Heather E, NP  desvenlafaxine (PRISTIQ) 100 MG 24 hr tablet Take 1 tablet (100 mg total) by mouth daily. 02/28/19  Yes Boscia, Heather E, NP  enalapril (VASOTEC) 20 MG tablet Take 1 tablet (20 mg total) by mouth 2 (two) times daily. 10/23/18  Yes Boscia, Greer Ee, NP  hydrochlorothiazide (HYDRODIURIL) 25 MG tablet Take 1 tablet (25 mg total) by mouth daily. 02/28/19  Yes Boscia, Greer Ee, NP  hydrOXYzine (ATARAX/VISTARIL) 10 MG tablet Take 1 tablet (10 mg total) by mouth 3 (three) times daily as needed. 07/15/18  Yes Allyne Gee, MD  Multiple Vitamins-Calcium (ONE-A-DAY WOMENS PO) Take 1 tablet by mouth daily.   Yes [provider]  NON FORMULARY cpap  device   Yes [provider]  OVER THE COUNTER MEDICATION 1 tablet daily as needed. Takes generic stool softener for constipation d/t oxycodone.   Yes [provider]  oxyCODONE-acetaminophen (PERCOCET/ROXICET) 5-325 MG tablet Take 1 tablet by mouth every 6 (six) hours as needed for up to 5 days for severe pain. For acute post-procedural pain 03/24/19 03/29/19 Yes Gillis Santa, MD    Physical Exam Vitals: Blood pressure (!) 156/97, pulse 83, height 5\' 9"  (1.753 m), weight (!) 347 lb (157.4 kg).  General: NAD HEENT: normocephalic, anicteric  Pulmonary: No increased work of breathing Abdomen: NABS, soft, non-tender, non-distended.  Umbilicus without lesions.  No hepatomegaly, splenomegaly or masses palpable. No evidence of hernia  Genitourinary:  External: Normal external female genitalia.  Normal urethral meatus, normal  Bartholin's and Skene's glands.    Vagina: Normal vaginal mucosa, no evidence of prolapse.    Cervix: Grossly normal in appearance, no bleeding  Uterus: Non-enlarged, mobile, normal contour.  No CMT  Adnexa: ovaries non-enlarged, no adnexal masses  Rectal: deferred  Lymphatic: no evidence of inguinal lymphadenopathy Extremities: no edema, erythema, or tenderness Neurologic: Grossly intact Psychiatric: mood appropriate, affect full  Female chaperone present for pelvic portion of the physical exam  Assessment: 58 y.o. G2P2000 with pelvic pain  Problem List Items Addressed This Visit    None    Visit Diagnoses    Pelvic pain in female    -  Primary   Relevant Orders   US Transvaginal Non-OB       1) We discussed the possible etiologies for pelvic pain in women.  Gynecologic causes may include endometriosis, adenomyosis, pelvic inflammatory disease (PID), ovarian cysts, ovarian or tubal torsion, and in rare case gynecologic malignancy such as cervical, uterine, or ovarian cancer.  In addition thee possibility of non-gynecologic etiologies such as  urinary or GI tract pathology or disordered, as well as musculoskeletal problems.  The goal is to complete a basic work up in hopes of identifying the underlying cause which in turn will dictate treatment.  In the meantime supportive measures such as localized heat, and NSAIDs are reasonable first steps.     - Prescription drug database was not reviewed, UDS was not ordered - Transvaginal ultrasound ordered - Blood work obtained today No  - Cervical cultures No  2) Return in about 1 week (around 04/01/2019) for GYN and TVUS follow up.   Malachy Mood, MD, [facility], [facility] 03/25/2019, 3:13 PM

## 2019-03-26 ENCOUNTER — Telehealth: Payer: Self-pay | Admitting: Student in an Organized Health Care Education/Training Program

## 2019-03-26 NOTE — Telephone Encounter (Signed)
Called patient and she states that the pharm did not recive her medication. Reviewed chart and it was sent 03/24/19 1059.Marland Kitchen Patient to call pharmacy.

## 2019-03-26 NOTE — Telephone Encounter (Signed)
Patient called stating she need refill on pain med given to her on 9-28. She only has 2 pills left.

## 2019-03-27 ENCOUNTER — Ambulatory Visit: Payer: BC Managed Care – PPO | Admitting: Internal Medicine

## 2019-04-02 ENCOUNTER — Other Ambulatory Visit: Payer: Self-pay | Admitting: Student in an Organized Health Care Education/Training Program

## 2019-04-02 DIAGNOSIS — M47816 Spondylosis without myelopathy or radiculopathy, lumbar region: Secondary | ICD-10-CM

## 2019-04-03 ENCOUNTER — Ambulatory Visit (INDEPENDENT_AMBULATORY_CARE_PROVIDER_SITE_OTHER): Payer: BC Managed Care – PPO | Admitting: Obstetrics and Gynecology

## 2019-04-03 ENCOUNTER — Encounter: Payer: Self-pay | Admitting: Internal Medicine

## 2019-04-03 ENCOUNTER — Ambulatory Visit: Payer: BC Managed Care – PPO | Admitting: Internal Medicine

## 2019-04-03 ENCOUNTER — Other Ambulatory Visit: Payer: Self-pay

## 2019-04-03 ENCOUNTER — Ambulatory Visit (INDEPENDENT_AMBULATORY_CARE_PROVIDER_SITE_OTHER): Payer: BC Managed Care – PPO

## 2019-04-03 VITALS — BP 138/84 | Ht 69.0 in | Wt 349.0 lb

## 2019-04-03 VITALS — BP 140/80 | HR 82 | Temp 97.8°F | Resp 16 | Ht 69.0 in | Wt 349.0 lb

## 2019-04-03 DIAGNOSIS — G4733 Obstructive sleep apnea (adult) (pediatric): Secondary | ICD-10-CM

## 2019-04-03 DIAGNOSIS — R1031 Right lower quadrant pain: Secondary | ICD-10-CM | POA: Diagnosis not present

## 2019-04-03 DIAGNOSIS — J4520 Mild intermittent asthma, uncomplicated: Secondary | ICD-10-CM | POA: Diagnosis not present

## 2019-04-03 DIAGNOSIS — R102 Pelvic and perineal pain: Secondary | ICD-10-CM

## 2019-04-03 DIAGNOSIS — I10 Essential (primary) hypertension: Secondary | ICD-10-CM | POA: Diagnosis not present

## 2019-04-03 NOTE — Progress Notes (Signed)
Gynecology Ultrasound Follow Up  Chief Complaint:  Chief Complaint  Patient presents with  . Follow-up     History of Present Illness: Patient is a 58 y.o. female who presents today for ultrasound evaluation of pelvic pain.  Ultrasound demonstrates the following findgins Adnexa: no masses seen  Uterus: Non-enlarged with endometrial stripe 18mm Additional: no free fluid  Review of Systems: ROS  Past Medical History:  Past Medical History:  Diagnosis Date  . Anginal pain (HCC)    tightness related to anxiety  . Anxiety   . Depression   . GERD (gastroesophageal reflux disease)    throws up easily but not diagnosed with reflux  . History of C-section 28  . Hypertension   . Sleep apnea    uses cpap    Past Surgical History:  Past Surgical History:  Procedure Laterality Date  . CESAREAN SECTION  1992  . COLONOSCOPY     polyps removed first procedure. 2nd time all was clear  . COLONOSCOPY WITH PROPOFOL N/A 03/14/2019   Procedure: COLONOSCOPY WITH PROPOFOL;  Surgeon: Lin Landsman, MD;  Location: [facility] ENDOSCOPY;  Service: Gastroenterology;  Laterality: N/A;  . FINGER SURGERY Left 2011   left finger cut off x 2.(only up to last digit)  . HEMATOMA EVACUATION Left 1999   upper part of foot was injured d/t 500lb weight landing on her foot.   Marland Kitchen LAPAROSCOPIC SALPINGO OOPHERECTOMY Left 04/02/2018   Procedure: LAPAROSCOPIC SALPINGO OOPHORECTOMY;  Surgeon: Malachy Mood, MD;  Location: [facility] ORS;  Service: Gynecology;  Laterality: Left;    Gynecologic History:  No LMP recorded. Patient is postmenopausal. Contraception: post menopausal status Last Pap: 06/03/2018 Results were: .no abnormalities  Family History:  Family History  Problem Relation Age of Onset  . Breast cancer Mother 40       x 3 times  . Colon cancer Mother   . Brain cancer Mother   . Cancer - Colon Mother   . Cancer - Other Mother   . Heart Problems Father   . Colon cancer Brother   . Heart  Problems Brother     Social History:  Social History   Socioeconomic History  . Marital status: Divorced    Spouse name: Not on file  . Number of children: 1  . Years of education: Not on file  . Highest education level: Not on file  Occupational History  . Occupation: works in Secretary/administrator  Social Needs  . Financial resource strain: Not on file  . Food insecurity    Worry: Not on file    Inability: Not on file  . Transportation needs    Medical: Not on file    Non-medical: Not on file  Tobacco Use  . Smoking status: Never Smoker  . Smokeless tobacco: Never Used  Substance and Sexual Activity  . Alcohol use: No    Frequency: Never  . Drug use: No  . Sexual activity: Not Currently    Birth control/protection: Post-menopausal  Lifestyle  . Physical activity    Days per week: Not on file    Minutes per session: Not on file  . Stress: Not on file  Relationships  . Social Herbalist on phone: Not on file    Gets together: Not on file    Attends religious service: Not on file    Active member of club or organization: Not on file    Attends meetings of clubs or organizations: Not on file  Relationship status: Not on file  . Intimate partner violence    Fear of current or ex partner: Not on file    Emotionally abused: Not on file    Physically abused: Not on file    Forced sexual activity: Not on file  Other Topics Concern  . Not on file  Social History Narrative   Son has schizophrenia and autism but is fully capable of helping mother after surgery    Allergies:  Allergies  Allergen Reactions  . Gabapentin Other (See Comments)    Unknown    Medications: Prior to Admission medications   Medication Sig Start Date End Date Taking? Authorizing Provider  albuterol (VENTOLIN HFA) 108 (90 Base) MCG/ACT inhaler Inhale 2 puffs into the lungs every 6 (six) hours as needed for wheezing or shortness of breath. 01/23/19   Kendell Bane, NP   budesonide-formoterol (SYMBICORT) 160-4.5 MCG/ACT inhaler Inhale 2 puffs into the lungs 2 (two) times daily. 03/20/19   Kendell Bane, NP  busPIRone (BUSPAR) 15 MG tablet Take 1 tablet (15 mg total) by mouth 3 (three) times daily. Take one tab po bid for one week, then increase to tid for anxiety 02/28/19   Ronnell Freshwater, NP  cetirizine (ZYRTEC) 10 MG tablet TAKE 1 TABLET(S) BY MOUTH DAILY FOR ALLERGIES 01/16/19   Ronnell Freshwater, NP  desvenlafaxine (PRISTIQ) 100 MG 24 hr tablet Take 1 tablet (100 mg total) by mouth daily. 02/28/19   Ronnell Freshwater, NP  enalapril (VASOTEC) 20 MG tablet Take 1 tablet (20 mg total) by mouth 2 (two) times daily. 10/23/18   Ronnell Freshwater, NP  hydrochlorothiazide (HYDRODIURIL) 25 MG tablet Take 1 tablet (25 mg total) by mouth daily. 02/28/19   Ronnell Freshwater, NP  hydrOXYzine (ATARAX/VISTARIL) 10 MG tablet Take 1 tablet (10 mg total) by mouth 3 (three) times daily as needed. 07/15/18   Allyne Gee, MD  Multiple Vitamins-Calcium (ONE-A-DAY WOMENS PO) Take 1 tablet by mouth daily.    [provider]  NON FORMULARY cpap device    [provider]  OVER THE COUNTER MEDICATION 1 tablet daily as needed. Takes generic stool softener for constipation d/t oxycodone.    [provider]    Physical Exam Vitals: Blood pressure 138/84, height 5\' 9"  (1.753 m), weight (!) 349 lb (158.3 kg).  General: NAD HEENT: normocephalic, anicteric Pulmonary: No increased work of breathing Extremities: no edema, erythema, or tenderness Neurologic: Grossly intact, normal gait Psychiatric: mood appropriate, affect full  US Transvaginal Non-ob  Result Date: 04/03/2019 Patient Name: Candace Clayton DOB: 11-15-1960 MRN: [id number] ULTRASOUND REPORT Location: [facility] Date of Service: 04/03/2019 Indications:Pelvic Pain Findings: The uterus is anteverted and measures 8.5 x 4.7 x 3.9 cm. Echo texture is homogenous without evidence of focal masses. The  Endometrium measures 9.1 mm. Right Ovary is not visible. Left Ovary is not visible. Survey of the adnexa demonstrates no adnexal masses. There is no free fluid in the cul de sac. Impression: 1. Incidental endometrial thickening 2. Neither ovary is visible. Recommendations: 1.Clinical correlation with the patient's History and Physical Exam. Gweneth Dimitri, RT Images reviewed.  Normal GYN study without visualized pathology.  Incidental endometrial thickening noted.  An endometrial measurement greater than 4 mm that is incidentally discovered in a postmenopausal patient without bleeding need not routinely trigger evaluation, although an individualized assessment based on patient characteristics and risk factors is appropriate. Thus, transvaginal ultrasonography is not an appropriate screening tool for endometrial  cancer in postmenopausal women without bleeding.  ACOG Committee Opinion 734, May 2018 "The Role of Transvaginal Ultrasonography in Evaluating the Endometrium of Women with Postmenopausal Bleeding" Malachy Mood, MD, [place], [place] 04/03/2019, 9:31 AM   [facility] Pain Clinic C-arm 1-60 Min No Report  Result Date: 03/24/2019 Fluoro was used, but no Radiologist interpretation will be provided. Please refer to "NOTES" tab for provider progress note.    Assessment: 58 y.o. presenting for follow up of pelvic pain  Plan: Problem List Items Addressed This Visit      Other   Right lower quadrant pain - Primary      1) Normal [place] continued to work on back pain at pain clinic. There is no evidence of a right adnexal process to explain the patient current symptomatology.   2) A total of 15 minutes were spent in face-to-face contact with the patient during this encounter with over half of that time devoted to counseling and coordination of care.  3) Return in about 1 year (around 04/02/2020) for annual.   Malachy Mood, MD, [place], [place] 04/03/2019, 9:42 AM

## 2019-04-03 NOTE — Progress Notes (Signed)
[facility] [place], [place] [address]  Pulmonary Sleep Medicine   Office Visit Note  Patient Name: Candace Clayton DOB: [birth year] MRN [id number]  Date of Service: 04/03/2019  Complaints/HPI: PT is here for follow up on sleep study.  Her sleep study showed an AHI or 29.8, with 277 hypoapneas, and 17 obstructive apneas.  She will need a titration study, to determine optimal pressure for her new machine.   ROS  General: (-) fever, (-) chills, (-) night sweats, (-) weakness Skin: (-) rashes, (-) itching,. Eyes: (-) visual changes, (-) redness, (-) itching. Nose and Sinuses: (-) nasal stuffiness or itchiness, (-) postnasal drip, (-) nosebleeds, (-) sinus trouble. Mouth and Throat: (-) sore throat, (-) hoarseness. Neck: (-) swollen glands, (-) enlarged thyroid, (-) neck pain. Respiratory: - cough, (-) bloody sputum, - shortness of breath, - wheezing. Cardiovascular: - ankle swelling, (-) chest pain. Lymphatic: (-) lymph node enlargement. Neurologic: (-) numbness, (-) tingling. Psychiatric: (-) anxiety, (-) depression   Current Medication: Outpatient Encounter Medications as of 04/03/2019  Medication Sig  . albuterol (VENTOLIN HFA) 108 (90 Base) MCG/ACT inhaler Inhale 2 puffs into the lungs every 6 (six) hours as needed for wheezing or shortness of breath.  . budesonide-formoterol (SYMBICORT) 160-4.5 MCG/ACT inhaler Inhale 2 puffs into the lungs 2 (two) times daily.  . busPIRone (BUSPAR) 15 MG tablet Take 1 tablet (15 mg total) by mouth 3 (three) times daily. Take one tab po bid for one week, then increase to tid for anxiety  . cetirizine (ZYRTEC) 10 MG tablet TAKE 1 TABLET(S) BY MOUTH DAILY FOR ALLERGIES  . desvenlafaxine (PRISTIQ) 100 MG 24 hr tablet Take 1 tablet (100 mg total) by mouth daily.  . enalapril (VASOTEC) 20 MG tablet Take 1 tablet (20 mg total) by mouth 2 (two) times daily.  . hydrochlorothiazide (HYDRODIURIL) 25 MG tablet Take 1 tablet (25 mg  total) by mouth daily.  . hydrOXYzine (ATARAX/VISTARIL) 10 MG tablet Take 1 tablet (10 mg total) by mouth 3 (three) times daily as needed.  . Multiple Vitamins-Calcium (ONE-A-DAY WOMENS PO) Take 1 tablet by mouth daily.  . NON FORMULARY cpap device  . OVER THE COUNTER MEDICATION 1 tablet daily as needed. Takes generic stool softener for constipation d/t oxycodone.   No facility-administered encounter medications on file as of 04/03/2019.     Surgical History: Past Surgical History:  Procedure Laterality Date  . CESAREAN SECTION  1992  . COLONOSCOPY     polyps removed first procedure. 2nd time all was clear  . COLONOSCOPY WITH PROPOFOL N/A 03/14/2019   Procedure: COLONOSCOPY WITH PROPOFOL;  Surgeon: Lin Landsman, MD;  Location: [facility] ENDOSCOPY;  Service: Gastroenterology;  Laterality: N/A;  . FINGER SURGERY Left 2011   left finger cut off x 2.(only up to last digit)  . HEMATOMA EVACUATION Left 1999   upper part of foot was injured d/t 500lb weight landing on her foot.   Marland Kitchen LAPAROSCOPIC SALPINGO OOPHERECTOMY Left 04/02/2018   Procedure: LAPAROSCOPIC SALPINGO OOPHORECTOMY;  Surgeon: Malachy Mood, MD;  Location: [facility] ORS;  Service: Gynecology;  Laterality: Left;    Medical History: Past Medical History:  Diagnosis Date  . Anginal pain (HCC)    tightness related to anxiety  . Anxiety   . Depression   . GERD (gastroesophageal reflux disease)    throws up easily but not diagnosed with reflux  . History of C-section 46  . Hypertension   . Sleep apnea    uses cpap  Family History: Family History  Problem Relation Age of Onset  . Breast cancer Mother 40       x 3 times  . Colon cancer Mother   . Brain cancer Mother   . Cancer - Colon Mother   . Cancer - Other Mother   . Heart Problems Father   . Colon cancer Brother   . Heart Problems Brother     Social History: Social History   Socioeconomic History  . Marital status: Divorced    Spouse name: Not on file   . Number of children: 1  . Years of education: Not on file  . Highest education level: Not on file  Occupational History  . Occupation: works in Secretary/administrator  Social Needs  . Financial resource strain: Not on file  . Food insecurity    Worry: Not on file    Inability: Not on file  . Transportation needs    Medical: Not on file    Non-medical: Not on file  Tobacco Use  . Smoking status: Never Smoker  . Smokeless tobacco: Never Used  Substance and Sexual Activity  . Alcohol use: No    Frequency: Never  . Drug use: No  . Sexual activity: Not Currently    Birth control/protection: Post-menopausal  Lifestyle  . Physical activity    Days per week: Not on file    Minutes per session: Not on file  . Stress: Not on file  Relationships  . Social Herbalist on phone: Not on file    Gets together: Not on file    Attends religious service: Not on file    Active member of club or organization: Not on file    Attends meetings of clubs or organizations: Not on file    Relationship status: Not on file  . Intimate partner violence    Fear of current or ex partner: Not on file    Emotionally abused: Not on file    Physically abused: Not on file    Forced sexual activity: Not on file  Other Topics Concern  . Not on file  Social History Narrative   Son has schizophrenia and autism but is fully capable of helping mother after surgery    Vital Signs: Blood pressure 140/80, pulse 82, temperature 97.8 F (36.6 C), resp. rate 16, height 5\' 9"  (1.753 m), weight (!) 349 lb (158.3 kg), SpO2 97 %.  Examination: General Appearance: The patient is well-developed, well-nourished, and in no distress. Skin: Gross inspection of skin unremarkable. Head: normocephalic, no gross deformities. Eyes: no gross deformities noted. ENT: ears appear grossly normal no exudates. Neck: Supple. No thyromegaly. No LAD. Respiratory: clear bilaterally. Cardiovascular: Normal S1 and S2 without  murmur or rub. Extremities: No cyanosis. pulses are equal. Neurologic: Alert and oriented. No involuntary movements.  LABS: Recent Results (from the past 2160 hour(s))  Comprehensive metabolic panel     Status: Abnormal   Collection Time: 03/06/19  9:57 AM  Result Value Ref Range   Glucose 152 (H) 65 - 99 mg/dL   BUN 12 6 - 24 mg/dL   Creatinine, Ser 0.80 0.57 - 1.00 mg/dL   GFR calc non Af Amer 82 >59 mL/min/1.73   GFR calc Af Amer 94 >59 mL/min/1.73   BUN/Creatinine Ratio 15 9 - 23   Sodium 139 134 - 144 mmol/L   Potassium 4.9 3.5 - 5.2 mmol/L   Chloride 105 96 - 106 mmol/L   CO2 23 20 -  29 mmol/L   Calcium 9.1 8.7 - 10.2 mg/dL   Total Protein 6.3 6.0 - 8.5 g/dL   Albumin 3.9 3.8 - 4.9 g/dL   Globulin, Total 2.4 1.5 - 4.5 g/dL   Albumin/Globulin Ratio 1.6 1.2 - 2.2   Bilirubin Total 0.2 0.0 - 1.2 mg/dL   Alkaline Phosphatase 92 39 - 117 IU/L   AST 26 0 - 40 IU/L   ALT 30 0 - 32 IU/L  CBC     Status: Abnormal   Collection Time: 03/06/19  9:57 AM  Result Value Ref Range   WBC 6.8 3.4 - 10.8 x10E3/uL   RBC 4.57 3.77 - 5.28 x10E6/uL   Hemoglobin 11.6 11.1 - 15.9 g/dL   Hematocrit 36.2 34.0 - 46.6 %   MCV 79 79 - 97 fL   MCH 25.4 (L) 26.6 - 33.0 pg   MCHC 32.0 31.5 - 35.7 g/dL   RDW 15.7 (H) 11.7 - 15.4 %   Platelets 266 150 - 450 x10E3/uL  Lipid Panel w/o Chol/HDL Ratio     Status: Abnormal   Collection Time: 03/06/19  9:57 AM  Result Value Ref Range   Cholesterol, Total 226 (H) 100 - 199 mg/dL   Triglycerides 237 (H) 0 - 149 mg/dL   HDL 55 >39 mg/dL   VLDL Cholesterol Cal 42 (H) 5 - 40 mg/dL   LDL Chol Calc (NIH) 129 (H) 0 - 99 mg/dL  Hgb A1c w/o eAG     Status: Abnormal   Collection Time: 03/06/19  9:57 AM  Result Value Ref Range   Hgb A1c MFr Bld 7.6 (H) 4.8 - 5.6 %    Comment:          Prediabetes: 5.7 - 6.4          Diabetes: >6.4          Glycemic control for adults with diabetes: <7.0   T4, free     Status: None   Collection Time: 03/06/19  9:57 AM   Result Value Ref Range   Free T4 0.88 0.82 - 1.77 ng/dL  TSH     Status: None   Collection Time: 03/06/19  9:57 AM  Result Value Ref Range   TSH 1.840 0.450 - 4.500 uIU/mL  VITAMIN D 25 Hydroxy (Vit-D Deficiency, Fractures)     Status: Abnormal   Collection Time: 03/06/19  9:57 AM  Result Value Ref Range   Vit D, 25-Hydroxy 17.2 (L) 30.0 - 100.0 ng/mL    Comment: Vitamin D deficiency has been defined by the [place] and an Endocrine Society practice guideline as a level of serum 25-OH vitamin D less than 20 ng/mL (1,2). The Endocrine Society went on to further define vitamin D insufficiency as a level between 21 and 29 ng/mL (2). 1. IOM (Institute of Medicine). 2010. Dietary reference    intakes for calcium and D. [place]: The    [employer]. 2. Holick MF, [place], Bischoff-Ferrari HA, et al.    Evaluation, treatment, and prevention of vitamin D    deficiency: an Endocrine Society clinical practice    guideline. JCEM. 2011 Jul; 96(7):1911-30.   SARS CORONAVIRUS 2 (TAT 6-24 HRS) Nasopharyngeal Nasopharyngeal Swab     Status: None   Collection Time: 03/11/19 11:04 AM   Specimen: Nasopharyngeal Swab  Result Value Ref Range   SARS Coronavirus 2 NEGATIVE NEGATIVE    Comment: (NOTE) SARS-CoV-2 target nucleic acids are NOT DETECTED. The SARS-CoV-2 RNA is generally detectable  in upper and lower respiratory specimens during the acute phase of infection. Negative results do not preclude SARS-CoV-2 infection, do not rule out co-infections with other pathogens, and should not be used as the sole basis for treatment or other patient management decisions. Negative results must be combined with clinical observations, patient history, and epidemiological information. The expected result is Negative. Fact Sheet for Patients: SugarRoll.be Fact Sheet for Healthcare Providers: https://www.woods-mathews.com/ This test  is not yet approved or cleared by the [place] FDA and  has been authorized for detection and/or diagnosis of SARS-CoV-2 by FDA under an Emergency Use Authorization (EUA). This EUA will remain  in effect (meaning this test can be used) for the duration of the COVID-19 declaration under Section 56 4(b)(1) of the Act, 21 U.S.C. section 360bbb-3(b)(1), unless the authorization is terminated or revoked sooner. Performed at [facility], [place] [address], [place], [place] [address]   Surgical pathology     Status: None   Collection Time: 03/14/19 10:54 AM  Result Value Ref Range   SURGICAL PATHOLOGY      SURGICAL PATHOLOGY CASE: [phone] PATIENT: [facility] Surgical Pathology Report     Specimen Submitted: A. Colon polyp x3, descending; cold snare B. Colon polyp, sigmoid; cold snare  Clinical History: Screening colonoscopy.  Diverticulosis; colon polyps      DIAGNOSIS: A. COLON POLYP X3, DESCENDING; BIOPSY: - TUBULAR ADENOMA (2) AND A POLYPOID FRAGMENT OF BENIGN COLONIC MUCOSA. - NEGATIVE FOR HIGH-GRADE DYSPLASIA AND MALIGNANCY.  B. COLON POLYP, SIGMOID; BIOPSY: - TUBULAR ADENOMA. - NEGATIVE FOR HIGH-GRADE DYSPLASIA AND MALIGNANCY.   GROSS DESCRIPTION: A. Labeled: Cold snare polyp descending colon x3 Received: In formalin Tissue fragment(s): 3 Size: From 0.3-0.4 cm Description: Tan soft tissue fragments Entirely submitted in 1 cassette.  B. Labeled: Cold snare polyp sigmoid colon Received: In formalin Tissue fragment(s): 2 Size: 0.2 and 0.3 cm Description: Tan soft tissue fragments Entirely submitted in 1 cassette.   Final Diagnosis p erformed by Raynelle Bring, MD.   Electronically signed 03/17/2019 1:55:33PM The electronic signature indicates that the named Attending Pathologist has evaluated the specimen Technical component performed at [facility], [address] [place], [place] [address] Lab: [phone] Dir: Rush Farmer, MD, MMM   Professional component performed at [facility], [facility], [place], [place], [place] [address] Lab: [phone] Dir: Dellia Nims. Reuel Derby, MD     Radiology: [facility] C-arm 1-60 Min No Report  Result Date: 03/24/2019 Fluoro was used, but no Radiologist interpretation will be provided. Please refer to "NOTES" tab for provider progress note.   US Transvaginal Non-ob  Result Date: 04/03/2019 Patient Name: Candace Clayton DOB: 08/17/1960 MRN: [id number] ULTRASOUND REPORT Location: [facility] Date of Service: 04/03/2019 Indications:Pelvic Pain Findings: The uterus is anteverted and measures 8.5 x 4.7 x 3.9 cm. Echo texture is homogenous without evidence of focal masses. The Endometrium measures 9.1 mm. Right Ovary is not visible. Left Ovary is not visible. Survey of the adnexa demonstrates no adnexal masses. There is no free fluid in the cul de sac. Impression: 1. Incidental endometrial thickening 2. Neither ovary is visible. Recommendations: 1.Clinical correlation with the patient's History and Physical Exam. Gweneth Dimitri, RT Images reviewed.  Normal GYN study without visualized pathology.  Incidental endometrial thickening noted.  An endometrial measurement greater than 4 mm that is incidentally discovered in a postmenopausal patient without bleeding need not routinely trigger evaluation, although an individualized assessment based on patient characteristics and risk factors is appropriate.  Thus, transvaginal ultrasonography is not an appropriate screening tool for endometrial cancer in postmenopausal women without bleeding.  ACOG Committee Opinion 734, May 2018 "The Role of Transvaginal Ultrasonography in Evaluating the Endometrium of Women with Postmenopausal Bleeding" Malachy Mood, MD, [place], [place] 04/03/2019, 9:31 AM    US Transvaginal Non-ob  Result Date: 04/03/2019 Patient Name: Candace Clayton DOB: 1961-03-18 MRN: [id number]  ULTRASOUND REPORT Location: [facility] Date of Service: 04/03/2019 Indications:Pelvic Pain Findings: The uterus is anteverted and measures 8.5 x 4.7 x 3.9 cm. Echo texture is homogenous without evidence of focal masses. The Endometrium measures 9.1 mm. Right Ovary is not visible. Left Ovary is not visible. Survey of the adnexa demonstrates no adnexal masses. There is no free fluid in the cul de sac. Impression: 1. Incidental endometrial thickening 2. Neither ovary is visible. Recommendations: 1.Clinical correlation with the patient's History and Physical Exam. Gweneth Dimitri, RT Images reviewed.  Normal GYN study without visualized pathology.  Incidental endometrial thickening noted.  An endometrial measurement greater than 4 mm that is incidentally discovered in a postmenopausal patient without bleeding need not routinely trigger evaluation, although an individualized assessment based on patient characteristics and risk factors is appropriate. Thus, transvaginal ultrasonography is not an appropriate screening tool for endometrial cancer in postmenopausal women without bleeding.  ACOG Committee Opinion 734, May 2018 "The Role of Transvaginal Ultrasonography in Evaluating the Endometrium of Women with Postmenopausal Bleeding" Malachy Mood, MD, [place], [place] 04/03/2019, 9:31 AM   [facility] C-arm 1-60 Min No Report  Result Date: 03/24/2019 Fluoro was used, but no Radiologist interpretation will be provided. Please refer to "NOTES" tab for provider progress note.  Mm 3d Screen Breast Bilateral  Result Date: 03/07/2019 CLINICAL DATA:  Screening. EXAM: DIGITAL SCREENING BILATERAL MAMMOGRAM WITH TOMO AND CAD COMPARISON:  Previous exam(s). ACR Breast Density Category b: There are scattered areas of fibroglandular density. FINDINGS: There are no findings suspicious for malignancy. Images were processed with CAD. IMPRESSION: No mammographic evidence of malignancy. A  result letter of this screening mammogram will be mailed directly to the patient. RECOMMENDATION: Screening mammogram in one year. (Code:SM-B-01Y) BI-RADS CATEGORY  1: Negative. Electronically Signed   By: Franki Cabot M.D.   On: 03/07/2019 09:28      Assessment and Plan: Patient Active Problem List   Diagnosis Date Noted  . Family history of colon cancer in mother   . Right lower quadrant pain 03/12/2019  . History of ovarian cyst 03/12/2019  . Chronic pain syndrome 03/05/2019  . Seasonal allergic rhinitis due to pollen 08/26/2018  . Lumbar facet arthropathy (R>L) 08/22/2018  . Lumbar spondylosis 08/22/2018  . Left ovarian cyst 03/03/2018  . Lumbar degenerative disc disease 12/23/2017  . Encounter for screening colonoscopy 10/24/2017  . Low back pain with sciatica 07/03/2017  . Allergic rhinitis, unspecified 07/03/2017  . Pain in unspecified knee 07/03/2017  . Obstructive sleep apnea, adult 07/03/2017  . Sedative, hypnotic, or anxiolytic use, unspecified, uncomplicated [id number]  . Hypersomnia 06/27/2017  . Major depressive disorder, recurrent episode, moderate ([place]) 06/27/2017  . Essential hypertension 06/27/2017  . Morbid obesity ([place]) 06/27/2017  . GAD (generalized anxiety disorder) 06/27/2017  . Urinary tract infection 06/27/2017    1. Obstructive sleep apnea Will get titration study to determine optimal pressure for new machine.  - Cpap titration; Future  2. Mild intermittent asthma without complication Stable, continue to use inhalers as discussed.   3. Essential (primary)  hypertension Stable, continue current therapy.   4. Morbid obesity ([place]) Obesity Counseling: Risk Assessment: An assessment of behavioral risk factors was made today and includes lack of exercise sedentary lifestyle, lack of portion control and poor dietary habits.  Risk Modification Advice: She was counseled on portion control guidelines. Restricting daily caloric intake to. . The detrimental  long term effects of obesity on her health and ongoing poor compliance was also discussed with the patient.    General Counseling: I have discussed the findings of the evaluation and examination with Jamacia.  I have also discussed any further diagnostic evaluation thatmay be needed or ordered today. Teona verbalizes understanding of the findings of todays visit. We also reviewed her medications today and discussed drug interactions and side effects including but not limited excessive drowsiness and altered mental states. We also discussed that there is always a risk not just to her but also people around her. she has been encouraged to call the office with any questions or concerns that should arise related to todays visit.    Time spent: 25  I have personally obtained a history, examined the patient, evaluated laboratory and imaging results, formulated the assessment and plan and placed orders.    Allyne Gee, MD [facility] Pulmonary and Critical Care Sleep medicine

## 2019-04-09 ENCOUNTER — Other Ambulatory Visit: Payer: Self-pay

## 2019-04-09 ENCOUNTER — Encounter: Payer: Self-pay | Admitting: Adult Health

## 2019-04-09 ENCOUNTER — Ambulatory Visit: Payer: BC Managed Care – PPO | Admitting: Adult Health

## 2019-04-09 VITALS — BP 150/86 | HR 66 | Resp 16 | Ht 69.0 in | Wt 349.0 lb

## 2019-04-09 DIAGNOSIS — G4733 Obstructive sleep apnea (adult) (pediatric): Secondary | ICD-10-CM | POA: Diagnosis not present

## 2019-04-09 DIAGNOSIS — Z9989 Dependence on other enabling machines and devices: Secondary | ICD-10-CM

## 2019-04-09 DIAGNOSIS — I10 Essential (primary) hypertension: Secondary | ICD-10-CM

## 2019-04-09 DIAGNOSIS — M5440 Lumbago with sciatica, unspecified side: Secondary | ICD-10-CM

## 2019-04-09 MED ORDER — PREDNISONE 10 MG PO TABS: ORAL_TABLET | ORAL | 0 refills | Status: DC

## 2019-04-09 NOTE — Progress Notes (Signed)
[facility] [place], [place] [address]  Internal MEDICINE  Office Visit Note  Patient Name: Candace Clayton  [id number]  [id number]  Date of Service: 04/09/2019  Chief Complaint  Patient presents with  . Leg Pain    right leg feels like it is asleep from foot up to hip, worried about blood clot forming in leg     HPI Pt is here for a sick visit.  Pt is having right hip pain.  This is not a new pain for her.  She reports the pain is radiating down to below her knee. She is concerned about a blood clot.  She describes it as feeling like "its sleeping, and I need to wake it up."  Denies fever, sob or other issues.    Current Medication:  Outpatient Encounter Medications as of 04/09/2019  Medication Sig  . albuterol (VENTOLIN HFA) 108 (90 Base) MCG/ACT inhaler Inhale 2 puffs into the lungs every 6 (six) hours as needed for wheezing or shortness of breath.  . budesonide-formoterol (SYMBICORT) 160-4.5 MCG/ACT inhaler Inhale 2 puffs into the lungs 2 (two) times daily.  . busPIRone (BUSPAR) 15 MG tablet Take 1 tablet (15 mg total) by mouth 3 (three) times daily. Take one tab po bid for one week, then increase to tid for anxiety  . cetirizine (ZYRTEC) 10 MG tablet TAKE 1 TABLET(S) BY MOUTH DAILY FOR ALLERGIES  . desvenlafaxine (PRISTIQ) 100 MG 24 hr tablet Take 1 tablet (100 mg total) by mouth daily.  . enalapril (VASOTEC) 20 MG tablet Take 1 tablet (20 mg total) by mouth 2 (two) times daily.  . hydrochlorothiazide (HYDRODIURIL) 25 MG tablet Take 1 tablet (25 mg total) by mouth daily.  . hydrOXYzine (ATARAX/VISTARIL) 10 MG tablet Take 1 tablet (10 mg total) by mouth 3 (three) times daily as needed.  . Multiple Vitamins-Calcium (ONE-A-DAY WOMENS PO) Take 1 tablet by mouth daily.  . NON FORMULARY cpap device  . OVER THE COUNTER MEDICATION 1 tablet daily as needed. Takes generic stool softener for constipation d/t oxycodone.  . predniSONE (DELTASONE) 10 MG tablet  Use per dose pack   No facility-administered encounter medications on file as of 04/09/2019.       Medical History: Past Medical History:  Diagnosis Date  . Anginal pain (HCC)    tightness related to anxiety  . Anxiety   . Depression   . GERD (gastroesophageal reflux disease)    throws up easily but not diagnosed with reflux  . History of C-section 12  . Hypertension   . Sleep apnea    uses cpap     Vital Signs: BP (!) 150/86   Pulse 66   Resp 16   Ht 5\' 9"  (1.753 m)   Wt (!) 349 lb (158.3 kg)   SpO2 98%   BMI 51.54 kg/m    Review of Systems  Constitutional: Negative for chills, fatigue and unexpected weight change.  HENT: Negative for congestion, rhinorrhea, sneezing and sore throat.   Eyes: Negative for photophobia, pain and redness.  Respiratory: Negative for cough, chest tightness and shortness of breath.   Cardiovascular: Negative for chest pain and palpitations.  Gastrointestinal: Negative for abdominal pain, constipation, diarrhea, nausea and vomiting.  Endocrine: Negative.   Genitourinary: Negative for dysuria and frequency.  Musculoskeletal: Negative for arthralgias, back pain, joint swelling and neck pain.  Skin: Negative for rash.  Allergic/Immunologic: Negative.   Neurological: Negative for tremors and numbness.  Hematological: Negative for adenopathy. Does  not bruise/bleed easily.  Psychiatric/Behavioral: Negative for behavioral problems and sleep disturbance. The patient is not nervous/anxious.     Physical Exam Vitals signs and nursing note reviewed.  Constitutional:      General: She is not in acute distress.    Appearance: She is well-developed. She is not diaphoretic.  HENT:     Head: Normocephalic and atraumatic.     Mouth/Throat:     Pharynx: No oropharyngeal exudate.  Eyes:     Pupils: Pupils are equal, round, and reactive to light.  Neck:     Musculoskeletal: Normal range of motion and neck supple.     Thyroid: No thyromegaly.      Vascular: No JVD.     Trachea: No tracheal deviation.  Cardiovascular:     Rate and Rhythm: Normal rate and regular rhythm.     Heart sounds: Normal heart sounds. No murmur. No friction rub. No gallop.   Pulmonary:     Effort: Pulmonary effort is normal. No respiratory distress.     Breath sounds: Normal breath sounds. No wheezing or rales.  Chest:     Chest wall: No tenderness.  Abdominal:     Palpations: Abdomen is soft.     Tenderness: There is no abdominal tenderness. There is no guarding.  Musculoskeletal: Normal range of motion.  Lymphadenopathy:     Cervical: No cervical adenopathy.  Skin:    General: Skin is warm and dry.  Neurological:     Mental Status: She is alert and oriented to person, place, and time.     Cranial Nerves: No cranial nerve deficit.     Comments: Numbness/tingling from right hip to right ankle.    Psychiatric:        Behavior: Behavior normal.        Thought Content: Thought content normal.        Judgment: Judgment normal.    Assessment/Plan: 1. Back pain of lumbar region with sciatica Take prednisone taper as directed.  Follow up with pain mgmt at scheduled.  - predniSONE (DELTASONE) 10 MG tablet; Use per dose pack  Dispense: 21 tablet; Refill: 0  2. Essential (primary) hypertension continue to monitor.  Slightly elevated today 150/86  3. OSA on CPAP continue current compliance.   General Counseling: Candace Clayton verbalizes understanding of the findings of todays visit and agrees with plan of treatment. I have discussed any further diagnostic evaluation that may be needed or ordered today. We also reviewed her medications today. she has been encouraged to call the office with any questions or concerns that should arise related to todays visit.   No orders of the defined types were placed in this encounter.   Meds ordered this encounter  Medications  . predniSONE (DELTASONE) 10 MG tablet    Sig: Use per dose pack    Dispense:  21 tablet     Refill:  0    Time spent: 15 Minutes  This patient was seen by Orson Gear AGNP-C in Collaboration with Dr Lavera Guise as a part of collaborative care agreement.  Kendell Bane AGNP-C Internal Medicine

## 2019-04-10 ENCOUNTER — Other Ambulatory Visit: Payer: Self-pay | Admitting: Adult Health

## 2019-04-10 DIAGNOSIS — J4520 Mild intermittent asthma, uncomplicated: Secondary | ICD-10-CM

## 2019-04-14 ENCOUNTER — Telehealth: Payer: Self-pay | Admitting: Student in an Organized Health Care Education/Training Program

## 2019-04-14 ENCOUNTER — Other Ambulatory Visit: Payer: Self-pay

## 2019-04-14 ENCOUNTER — Ambulatory Visit: Payer: BC Managed Care – PPO | Admitting: Adult Health

## 2019-04-14 ENCOUNTER — Ambulatory Visit: Payer: BC Managed Care – PPO | Admitting: Nurse Practitioner

## 2019-04-14 ENCOUNTER — Encounter: Payer: Self-pay | Admitting: Adult Health

## 2019-04-14 VITALS — BP 150/80 | HR 98 | Temp 97.6°F | Resp 16 | Ht 69.0 in | Wt 356.2 lb

## 2019-04-14 DIAGNOSIS — Z9989 Dependence on other enabling machines and devices: Secondary | ICD-10-CM

## 2019-04-14 DIAGNOSIS — G4733 Obstructive sleep apnea (adult) (pediatric): Secondary | ICD-10-CM | POA: Diagnosis not present

## 2019-04-14 DIAGNOSIS — M5440 Lumbago with sciatica, unspecified side: Secondary | ICD-10-CM

## 2019-04-14 DIAGNOSIS — I10 Essential (primary) hypertension: Secondary | ICD-10-CM | POA: Diagnosis not present

## 2019-04-14 MED ORDER — PREDNISONE 10 MG PO TABS: ORAL_TABLET | ORAL | 0 refills | Status: DC

## 2019-04-14 NOTE — Telephone Encounter (Signed)
Patient is having pain in her thigh and numbness w/ tingling, states it is sciatic nerve pain, was told by her pcp (who usually writes script for predisone dose ) she needed to call Dr. Holley Raring and see what he can do for this problem. Please let patient know what to do to aleve her pain

## 2019-04-14 NOTE — Telephone Encounter (Signed)
Returned call. Family member states she is not there. Left message for her to return call.

## 2019-04-14 NOTE — Telephone Encounter (Signed)
According to her medical records, it seems that she may received a Prednisone taper on Oct 14. So long as patient has NOT had a steroid taper in the last month, I'm happy to write for her. Please call and ask.

## 2019-04-14 NOTE — Telephone Encounter (Signed)
Dr. Holley Raring,                Are you willing to do this for her?

## 2019-04-14 NOTE — Progress Notes (Signed)
[facility] [place], [place] [address]  Internal MEDICINE  Office Visit Note  Patient Name: Candace Clayton  [id number]  [id number]  Date of Service: 04/14/2019  Chief Complaint  Patient presents with  . Back Pain    ongoing back pain req prednisone     HPI Pt is here for a sick visit.  She recently completed a prednisone dose pak. She would like to take another one.  They first one helped the pain some, but she feels like if she took it for 6 more days it would be even better.   Current Medication:  Outpatient Encounter Medications as of 04/14/2019  Medication Sig  . albuterol (VENTOLIN HFA) 108 (90 Base) MCG/ACT inhaler TAKE 2 PUFFS BY MOUTH EVERY 6 HOURS AS NEEDED FOR WHEEZE OR SHORTNESS OF BREATH  . budesonide-formoterol (SYMBICORT) 160-4.5 MCG/ACT inhaler Inhale 2 puffs into the lungs 2 (two) times daily.  . busPIRone (BUSPAR) 15 MG tablet Take 1 tablet (15 mg total) by mouth 3 (three) times daily. Take one tab po bid for one week, then increase to tid for anxiety  . cetirizine (ZYRTEC) 10 MG tablet TAKE 1 TABLET(S) BY MOUTH DAILY FOR ALLERGIES  . desvenlafaxine (PRISTIQ) 100 MG 24 hr tablet Take 1 tablet (100 mg total) by mouth daily.  . enalapril (VASOTEC) 20 MG tablet Take 1 tablet (20 mg total) by mouth 2 (two) times daily.  . hydrochlorothiazide (HYDRODIURIL) 25 MG tablet Take 1 tablet (25 mg total) by mouth daily.  . hydrOXYzine (ATARAX/VISTARIL) 10 MG tablet Take 1 tablet (10 mg total) by mouth 3 (three) times daily as needed.  . Multiple Vitamins-Calcium (ONE-A-DAY WOMENS PO) Take 1 tablet by mouth daily.  . NON FORMULARY cpap device  . OVER THE COUNTER MEDICATION 1 tablet daily as needed. Takes generic stool softener for constipation d/t oxycodone.  . [DISCONTINUED] predniSONE (DELTASONE) 10 MG tablet Use per dose pack   No facility-administered encounter medications on file as of 04/14/2019.       Medical History: Past Medical  History:  Diagnosis Date  . Anginal pain (HCC)    tightness related to anxiety  . Anxiety   . Depression   . GERD (gastroesophageal reflux disease)    throws up easily but not diagnosed with reflux  . History of C-section 2  . Hypertension   . Sleep apnea    uses cpap     Vital Signs: BP (!) 150/80   Pulse 98   Temp 97.6 F (36.4 C)   Resp 16   Ht 5\' 9"  (1.753 m)   Wt (!) 356 lb 3.2 oz (161.6 kg)   SpO2 99%   BMI 52.60 kg/m    Review of Systems  Constitutional: Negative for chills, fatigue and unexpected weight change.  HENT: Negative for congestion, rhinorrhea, sneezing and sore throat.   Eyes: Negative for photophobia, pain and redness.  Respiratory: Negative for cough, chest tightness and shortness of breath.   Cardiovascular: Negative for chest pain and palpitations.  Gastrointestinal: Negative for abdominal pain, constipation, diarrhea, nausea and vomiting.  Endocrine: Negative.   Genitourinary: Negative for dysuria and frequency.  Musculoskeletal: Negative for arthralgias, back pain, joint swelling and neck pain.  Skin: Negative for rash.  Allergic/Immunologic: Negative.   Neurological: Negative for tremors and numbness.  Hematological: Negative for adenopathy. Does not bruise/bleed easily.  Psychiatric/Behavioral: Negative for behavioral problems and sleep disturbance. The patient is not nervous/anxious.     Physical Exam Vitals signs  and nursing note reviewed.  Constitutional:      General: She is not in acute distress.    Appearance: She is well-developed. She is not diaphoretic.  HENT:     Head: Normocephalic and atraumatic.     Mouth/Throat:     Pharynx: No oropharyngeal exudate.  Eyes:     Pupils: Pupils are equal, round, and reactive to light.  Neck:     Musculoskeletal: Normal range of motion and neck supple.     Thyroid: No thyromegaly.     Vascular: No JVD.     Trachea: No tracheal deviation.  Cardiovascular:     Rate and Rhythm: Normal  rate and regular rhythm.     Heart sounds: Normal heart sounds. No murmur. No friction rub. No gallop.   Pulmonary:     Effort: Pulmonary effort is normal. No respiratory distress.     Breath sounds: Normal breath sounds. No wheezing or rales.  Chest:     Chest wall: No tenderness.  Abdominal:     Palpations: Abdomen is soft.     Tenderness: There is no abdominal tenderness. There is no guarding.  Musculoskeletal: Normal range of motion.  Lymphadenopathy:     Cervical: No cervical adenopathy.  Skin:    General: Skin is warm and dry.  Neurological:     Mental Status: She is alert and oriented to person, place, and time.     Cranial Nerves: No cranial nerve deficit.  Psychiatric:        Behavior: Behavior normal.        Thought Content: Thought content normal.        Judgment: Judgment normal.     Assessment/Plan: 1. Back pain of lumbar region with sciatica Continue to use prednisone as discussed.   - predniSONE (DELTASONE) 10 MG tablet; Use per dose pack  Dispense: 21 tablet; Refill: 0  2. Essential (primary) hypertension Stable, continue present management.  3. OSA on CPAP Controlled, continue cpap use.   General Counseling: Laquonda verbalizes understanding of the findings of todays visit and agrees with plan of treatment. I have discussed any further diagnostic evaluation that may be needed or ordered today. We also reviewed her medications today. she has been encouraged to call the office with any questions or concerns that should arise related to todays visit.   No orders of the defined types were placed in this encounter.   No orders of the defined types were placed in this encounter.   Time spent: 15 Minutes  This patient was seen by Orson Gear AGNP-C in Collaboration with Dr Lavera Guise as a part of collaborative care agreement.  Kendell Bane AGNP-C Internal Medicine

## 2019-04-17 ENCOUNTER — Other Ambulatory Visit: Payer: Self-pay

## 2019-04-17 MED ORDER — CETIRIZINE HCL 10 MG PO TABS: ORAL_TABLET | ORAL | 4 refills | Status: DC

## 2019-04-21 ENCOUNTER — Telehealth: Payer: Self-pay | Admitting: Student in an Organized Health Care Education/Training Program

## 2019-04-21 NOTE — Telephone Encounter (Signed)
Any suggestions>?

## 2019-04-21 NOTE — Telephone Encounter (Signed)
Patient started having sciatica pain from right buttocks down thigh, calf and into foot. She has spoken with pcp and gotten 2 dose packs of steroids. This has not helped. Wants to know what Dr. Holley Raring recommends. She has [facility] appt on 11-5.

## 2019-04-21 NOTE — Procedures (Signed)
Reviewed

## 2019-04-22 ENCOUNTER — Encounter: Payer: BC Managed Care – PPO | Admitting: Internal Medicine

## 2019-04-22 MED ORDER — PREGABALIN 50 MG PO CAPS: 50.0000 mg | ORAL_CAPSULE | Freq: Two times a day (BID) | ORAL | 1 refills | Status: DC

## 2019-04-22 NOTE — Telephone Encounter (Signed)
Patient with pain right buttock 8/10, radiates to thigh. She has been on a steroid taper x2 given by her PCP. She is still having a lot of pain and didn't know what could be done for her. I encouraged her to stretch, use heat , and current medications. She states she is taking the medications as ordered and only has four left.Missed four days of work with this. What do you suggest for her?

## 2019-04-22 NOTE — Telephone Encounter (Signed)
Options include:  1. Proceed with RIGHT lumbar RFA as the patient is status post 2 diagnostic lumbar blocks.   2. Come in in for Norflex/Toradol injection  I'm also going to write Rx for Lyrica. She has tried Gabapentin in past which caused side effects.   Requested Prescriptions   Signed Prescriptions Disp Refills  . pregabalin (LYRICA) 50 MG capsule 60 capsule 1    Sig: Take 1 capsule (50 mg total) by mouth 2 (two) times daily.    Authorizing Provider: Gillis Santa

## 2019-04-22 NOTE — Telephone Encounter (Signed)
Can yall please schedule patient for Norflex/ Toradol shot and RF. I just spoke with the pt and she will be calling. Instructed her on RX for Lyrica

## 2019-04-22 NOTE — Telephone Encounter (Signed)
Ok patient coming 10-28 for Toradol / Norflex shot w/ Nurse Will Send to [facility] for PA on RF

## 2019-04-23 ENCOUNTER — Ambulatory Visit
Payer: BC Managed Care – PPO | Attending: Student in an Organized Health Care Education/Training Program | Admitting: Student in an Organized Health Care Education/Training Program

## 2019-04-23 ENCOUNTER — Encounter: Payer: Self-pay | Admitting: Student in an Organized Health Care Education/Training Program

## 2019-04-23 ENCOUNTER — Other Ambulatory Visit: Payer: Self-pay

## 2019-04-23 VITALS — BP 145/75 | HR 99 | Temp 98.3°F | Ht 69.0 in | Wt 348.0 lb

## 2019-04-23 DIAGNOSIS — M47816 Spondylosis without myelopathy or radiculopathy, lumbar region: Secondary | ICD-10-CM | POA: Diagnosis not present

## 2019-04-23 DIAGNOSIS — G894 Chronic pain syndrome: Secondary | ICD-10-CM | POA: Diagnosis not present

## 2019-04-23 MED ORDER — KETOROLAC TROMETHAMINE 30 MG/ML IJ SOLN
INTRAMUSCULAR | Status: AC
  Filled 2019-04-23: qty 1

## 2019-04-23 MED ORDER — ORPHENADRINE CITRATE 30 MG/ML IJ SOLN
60.0000 mg | Freq: Once | INTRAMUSCULAR | Status: AC
  Administered 2019-04-23: 60 mg via INTRAMUSCULAR

## 2019-04-23 MED ORDER — ORPHENADRINE CITRATE 30 MG/ML IJ SOLN
INTRAMUSCULAR | Status: AC
  Filled 2019-04-23: qty 2

## 2019-04-23 MED ORDER — KETOROLAC TROMETHAMINE 30 MG/ML IJ SOLN
30.0000 mg | Freq: Once | INTRAMUSCULAR | Status: AC
  Administered 2019-04-23: 30 mg via INTRAMUSCULAR

## 2019-04-23 NOTE — Progress Notes (Signed)
Safety precautions to be maintained throughout the outpatient stay will include: orient to surroundings, keep bed in low position, maintain call bell within reach at all times, provide assistance with transfer out of bed and ambulation.  

## 2019-04-23 NOTE — Progress Notes (Signed)
Patient's Name: Candace Clayton  MRN: [id number]  Referring Provider: Ronnell Freshwater, NP  DOB: 11-Jun-1961  PCP: Ronnell Freshwater, NP  DOS: 04/23/2019  Note by: Gillis Santa, MD  Service setting: Ambulatory outpatient  Attending: Gillis Santa, MD  Location: [facility] (AMB) Pain Management Facility  Specialty: Interventional Pain Management  Patient type: Established   Primary Reason(s) for Visit: Encounter for post-procedure evaluation of chronic illness with mild to moderate exacerbation, as well as review of chronic illnesses with exacerbation, or progression (Level of risk: moderate) CC: Back Pain  HPI  Candace Clayton is a 58 y.o. year old, female patient, who comes today for a post-procedure evaluation. She has Sedative, hypnotic, or anxiolytic use, unspecified, uncomplicated; Hypersomnia; Major depressive disorder, recurrent episode, moderate ([place]); Essential hypertension; Morbid obesity ([place]); GAD (generalized anxiety disorder); Urinary tract infection; Low back pain with sciatica; Allergic rhinitis, unspecified; Pain in unspecified knee; Obstructive sleep apnea, adult; Encounter for screening colonoscopy; Lumbar degenerative disc disease; Left ovarian cyst; Lumbar facet arthropathy (R>L); Lumbar spondylosis; Seasonal allergic rhinitis due to pollen; Chronic pain syndrome; Right lower quadrant pain; History of ovarian cyst; and Family history of colon cancer in mother on their problem list. Her primarily concern today is the Back Pain  Pain Assessment: Location: Lower, Right Back Radiating: pain in right leg Onset: More than a month ago Duration: Chronic pain Quality: Dull, Numbness, Aching Severity: 7 /10 (subjective, self-reported pain score)  Note: Reported level is compatible with observation.                         When using our objective Pain Scale, levels between 6 and 10/10 are said to belong in an emergency room, as it progressively worsens from a 6/10, described as severely limiting,  requiring emergency care not usually available at an outpatient pain management facility. At a 6/10 level, communication becomes difficult and requires great effort. Assistance to reach the emergency department may be required. Facial flushing and profuse sweating along with potentially dangerous increases in heart rate and blood pressure will be evident. Effect on ADL: limits my daily activities Timing: Constant Modifying factors: lay down BP: (!) 145/75  HR: 99  Candace Clayton comes in today for review of her chronic pain problem and post-procedure evaluation after the treatment done on 04/21/2019.   Further details on both, my assessment(s), as well as the proposed treatment plan, please see below.  Post-Procedure Assessment  04/21/2019 Procedure: Bilateral L3, L4, L5, S1 medial branch nerve block #2 (#1 done 01/08/2019) Pre-procedure pain score:  8/10 Post-procedure pain score: 0/10         Influential Factors: BMI: 51.39 kg/m Intra-procedural challenges: None observed.         Assessment challenges: None detected.              Reported side-effects: None.        Post-procedural adverse reactions or complications: None reported         Initial Analgesic Effects (1st post-procedure hour): 100 %            Sedation: Please see nurses note. When none is used, analgesia during this period is strictly due to the local anesthetic. When sedation is administered, analgesia may be a combination of the IV analgesic/anxiolytic, plus the effect of the local anesthetics used. Interpretative annotation: Clinically appropriate result. Analgesia during this period is likely to be Local Anesthetic and/or IV Sedative (Analgesic/Anxiolytic) related.  Persistent Analgesic Response (subsequent 4-6 hours post-procedure): 100 %            Analgesic effects during this period is associated to the localized infiltration of local anesthetics and therefore caries significant diagnostic value as to the  etiological location, or anatomical origin, of the pain. Expected duration of relief is directly dependent on the pharmacodynamics of the local anesthetic used. Long-acting (4-6 hours) anesthetics used.  Interpretative annotation: Clinically appropriate result. Analgesia during this period is likely to be Local Anesthetic-related.          Analgesic Response past initial 6 hours post-procedure: 100 %(lasted for two weeks)           Defined as the period of time past the expected duration of local anesthetics (1 hour for short-acting and 4-6 hours for long-acting). With the possible exception of prolonged sympathetic blockade from the local anesthetics, benefits during this period are typically attributed to, or associated with, other factors such as analgesic sensory neuropraxia, antiinflammatory effects, or beneficial biochemical changes provided by agents other than the local anesthetics.  Interpretative annotation: Clinically possible results. Good relief. No permanent benefit expected. Inflammation plays a part in the etiology to the pain.          Current benefits: Defined as reported results that persistent at this point in time.   Analgesia: 0 %            Function: Back to baseline ROM: Back to baseline Interpretative annotation: Recurrence of symptoms.   Interpretation: Results would suggest a successful diagnostic intervention.                  Plan:  Please see "Plan of Care" for details.                Laboratory Chemistry Profile   Screening Lab Results  Component Value Date   SARSCOV2NAA NEGATIVE 03/11/2019    Renal Lab Results  Component Value Date   BUN 12 03/06/2019   CREATININE 0.80 03/06/2019   BCR 15 03/06/2019   GFRAA 94 03/06/2019   GFRNONAA 82 03/06/2019                             Hepatic Lab Results  Component Value Date   AST 26 03/06/2019   ALT 30 03/06/2019   ALBUMIN 3.9 03/06/2019   ALKPHOS 92 03/06/2019                        Electrolytes Lab  Results  Component Value Date   NA 139 03/06/2019   K 4.9 03/06/2019   CL 105 03/06/2019   CALCIUM 9.1 03/06/2019                        Neuropathy Lab Results  Component Value Date   HGBA1C 7.6 (H) 03/06/2019                        Bone Lab Results  Component Value Date   VD25OH 17.2 (L) 03/06/2019                         Coagulation Lab Results  Component Value Date   PLT 266 03/06/2019                        Cardiovascular Lab  Results  Component Value Date   HGB 11.6 03/06/2019   HCT 36.2 03/06/2019                         ID Lab Results  Component Value Date   SARSCOV2NAA NEGATIVE 03/11/2019    Cancer Lab Results  Component Value Date   CEA 1.0 08/28/2016   CA125 8.1 08/28/2016                        Endocrine Lab Results  Component Value Date   TSH 1.840 03/06/2019   FREET4 0.88 03/06/2019                        Note: Lab results reviewed.  Recent Diagnostic Imaging Review   Lumbosacral Imaging: Lumbar MR wo contrast:  Results for orders placed during the hospital encounter of 02/15/18  MR Lumbar Spine Wo Contrast   Narrative CLINICAL DATA:  Low back pain for 3 years.  No known injury.  EXAM: MRI LUMBAR SPINE WITHOUT CONTRAST  TECHNIQUE: Multiplanar, multisequence MR imaging of the lumbar spine was performed. No intravenous contrast was administered.  COMPARISON:  Plain films lumbar spine 10/26/2017.  FINDINGS: Segmentation:  Standard.  Alignment: Facet mediated 0.4 cm anterolisthesis L4 on L5 is identified. Trace retrolisthesis T12 on L1 is also noted. There is exaggeration of the normal lumbar lordosis.  Vertebrae:  No fracture or worrisome lesion.  Conus medullaris and cauda equina: Conus extends to the T12-L1 level. Conus and cauda equina appear normal.  Paraspinal and other soft tissues: Stones measuring up to 1.9 cm are seen in the gallbladder. A partially visualized cystic lesion in the left pelvis measures 5.5 cm in  diameter.  Disc levels:  T10-11 and T11-12 are imaged in the sagittal plane only. There is loss of disc space height and a shallow bulge at T11-12. The disc appears slightly deform the ventral cord although the central canal appears open. Mild loss of disc space height T11-12 and a minimal bulge without stenosis also noted.  T12-L1: Shallow broad-based central protrusion with caudal extension. The central canal and foramina are open.  L1-2: Mild disc bulge without stenosis.  L2-3: Mild disc bulge and facet arthropathy without stenosis.  L3-4: Minimal disc bulge and mild facet degenerative disease without stenosis.  L4-5: Advanced bilateral facet degenerative change is seen. The disc is uncovered with a shallow bulge. Mild central canal narrowing is present. The foramina are open.  L5-S1: Bilateral facet degenerative disease and a shallow disc bulge are identified without central canal or foraminal stenosis. Far left paravertebral endplate spur incidentally noted.  IMPRESSION: Lumbar spondylosis most notable at L4-5 where advanced facet degenerative disease results in 0.4 cm anterolisthesis. There is mild central canal narrowing at this level. No nerve root compression.  Shallow broad-based disc bulge imaged in the sagittal plane only at T11-12 and slightly deforms the ventral cord but the central canal appears open at this level  5.5 cm cystic lesion left pelvis is partially imaged and could be ovarian in origin. Recommend pelvic ultrasound for further evaluation. This recommendation follows ACR consensus guidelines: White Paper of the ACR Incidental Findings Committee II on Adnexal Findings. J Am Coll Radiol [phone].  Gallstones.   Electronically Signed   By: Inge Rise M.D.   On: 02/15/2018 11:18    Lumbar DG (Complete) 4+V:  Results for orders placed during the hospital encounter of  10/26/17  DG Lumbar Spine Complete   Narrative CLINICAL DATA:   Lower back pain with right-sided sciatica.  EXAM: LUMBAR SPINE - COMPLETE 4+ VIEW  COMPARISON:  None.  FINDINGS: Minimal grade 1 anterolisthesis of L4-5 is noted secondary to posterior facet joint hypertrophy. No fracture is noted. Disc spaces appear to be well maintained.  IMPRESSION: Mild degenerative changes as described above. No acute abnormality seen in the lumbar spine.   Electronically Signed   By: Marijo Conception, M.D.   On: 10/27/2017 23:24     Complexity Note: Imaging results reviewed. Results shared with Ms. Skellenger, using [employer].                         Meds   Current Outpatient Medications:  .  albuterol (VENTOLIN HFA) 108 (90 Base) MCG/ACT inhaler, TAKE 2 PUFFS BY MOUTH EVERY 6 HOURS AS NEEDED FOR WHEEZE OR SHORTNESS OF BREATH, Disp: 1 g, Rfl: 2 .  budesonide-formoterol (SYMBICORT) 160-4.5 MCG/ACT inhaler, Inhale 2 puffs into the lungs 2 (two) times daily., Disp: 1 Inhaler, Rfl: 3 .  busPIRone (BUSPAR) 15 MG tablet, Take 1 tablet (15 mg total) by mouth 3 (three) times daily. Take one tab po bid for one week, then increase to tid for anxiety, Disp: 90 tablet, Rfl: 3 .  cetirizine (ZYRTEC) 10 MG tablet, TAKE 1 TABLET(S) BY MOUTH DAILY FOR ALLERGIES, Disp: 30 tablet, Rfl: 4 .  desvenlafaxine (PRISTIQ) 100 MG 24 hr tablet, Take 1 tablet (100 mg total) by mouth daily., Disp: 90 tablet, Rfl: 1 .  enalapril (VASOTEC) 20 MG tablet, Take 1 tablet (20 mg total) by mouth 2 (two) times daily., Disp: 60 tablet, Rfl: 5 .  hydrochlorothiazide (HYDRODIURIL) 25 MG tablet, Take 1 tablet (25 mg total) by mouth daily., Disp: 30 tablet, Rfl: 5 .  hydrOXYzine (ATARAX/VISTARIL) 10 MG tablet, Take 1 tablet (10 mg total) by mouth 3 (three) times daily as needed., Disp: 30 tablet, Rfl: 0 .  Multiple Vitamins-Calcium (ONE-A-DAY WOMENS PO), Take 1 tablet by mouth daily., Disp: , Rfl:  .  NON FORMULARY, cpap device, Disp: , Rfl:  .  OVER THE COUNTER MEDICATION, 1 tablet daily as  needed. Takes generic stool softener for constipation d/t oxycodone., Disp: , Rfl:  .  predniSONE (DELTASONE) 10 MG tablet, Use per dose pack, Disp: 21 tablet, Rfl: 0 .  pregabalin (LYRICA) 50 MG capsule, Take 1 capsule (50 mg total) by mouth 2 (two) times daily., Disp: 60 capsule, Rfl: 1  ROS  Constitutional: Denies any fever or chills Gastrointestinal: No reported hemesis, hematochezia, vomiting, or acute GI distress Musculoskeletal: Denies any acute onset joint swelling, redness, loss of ROM, or weakness Neurological: No reported episodes of acute onset apraxia, aphasia, dysarthria, agnosia, amnesia, paralysis, loss of coordination, or loss of consciousness  Allergies  Ms. Lifsey is allergic to gabapentin.  PFSH  Drug: Ms. Liggon  reports no history of drug use. Alcohol:  reports no history of alcohol use. Tobacco:  reports that she has never smoked. She has never used smokeless tobacco. Medical:  has a past medical history of Anginal pain ([place]), Anxiety, Depression, GERD (gastroesophageal reflux disease), History of C-section (1992), Hypertension, and Sleep apnea. Surgical: Ms. Florence  has a past surgical history that includes Cesarean section (1992); Finger surgery (Left, 2011); Hematoma evacuation (Left, 1999); Colonoscopy; Laparoscopic salpingo oophorectomy (Left, 04/02/2018); and Colonoscopy with propofol (N/A, 03/14/2019). Family: family history includes Brain cancer in her mother; Breast  cancer (age of onset: 93) in her mother; Cancer - Colon in her mother; Cancer - Other in her mother; Colon cancer in her brother and mother; Heart Problems in her brother and father.  Constitutional Exam  General appearance: Well nourished, well developed, and well hydrated. In no apparent acute distress Vitals:   04/23/19 0943  BP: (!) 145/75  Pulse: 99  Temp: 98.3 F (36.8 C)  SpO2: 100%  Weight: (!) 348 lb (157.9 kg)  Height: 5\' 9"  (1.753 m)   BMI Assessment: Estimated body mass index  is 51.39 kg/m as calculated from the following:   Height as of this encounter: 5\' 9"  (1.753 m).   Weight as of this encounter: 348 lb (157.9 kg).  BMI interpretation table: BMI level Category Range association with higher incidence of chronic pain  <18 kg/m2 Underweight   18.5-24.9 kg/m2 Ideal body weight   25-29.9 kg/m2 Overweight Increased incidence by 20%  30-34.9 kg/m2 Obese (Class I) Increased incidence by 68%  35-39.9 kg/m2 Severe obesity (Class II) Increased incidence by 136%  >40 kg/m2 Extreme obesity (Class III) Increased incidence by 254%   Patient's current BMI Ideal Body weight  Body mass index is 51.39 kg/m. Ideal body weight: 66.2 kg (145 lb 15.1 oz) Adjusted ideal body weight: 102.9 kg (226 lb 12.3 oz)   BMI Readings from Last 4 Encounters:  04/23/19 51.39 kg/m  04/14/19 52.60 kg/m  04/09/19 51.54 kg/m  04/03/19 51.54 kg/m   Wt Readings from Last 4 Encounters:  04/23/19 (!) 348 lb (157.9 kg)  04/14/19 (!) 356 lb 3.2 oz (161.6 kg)  04/09/19 (!) 349 lb (158.3 kg)  04/03/19 (!) 349 lb (158.3 kg)  Psych/Mental status: Alert, oriented x 3 (person, place, & time)       Eyes: PERLA Respiratory: No evidence of acute respiratory distress   Thoracic Spine Area Exam  Skin & Axial Inspection: No masses, redness, or swelling Alignment: Symmetrical Functional ROM: Unrestricted ROM Stability: No instability detected Muscle Tone/Strength: Functionally intact. No obvious neuro-muscular anomalies detected. Sensory (Neurological): Unimpaired Muscle strength & Tone: No palpable anomalies  Lumbar Spine Area Exam  Skin & Axial Inspection: No masses, redness, or swelling Alignment: Symmetrical Functional ROM: Decreased ROM       Stability: No instability detected Muscle Tone/Strength: Functionally intact. No obvious neuro-muscular anomalies detected. Sensory (Neurological): Musculoskeletal pain pattern Palpation: Complains of area being tender to palpation        Provocative Tests: Hyperextension/rotation test: (+) bilaterally for facet joint pain.  Right greater than left Lumbar quadrant test (Kemp's test): (+) bilaterally for facet joint pain.  Right greater than left Lateral bending test: (+) due to pain. Patrick's Maneuver: deferred today                   FABER* test: deferred today                   S-I anterior distraction/compression test: deferred today         S-I lateral compression test: deferred today         S-I Thigh-thrust test: deferred today         S-I Gaenslen's test: deferred today         *(Flexion, ABduction and External Rotation)  Gait & Posture Assessment  Ambulation: Unassisted Gait: Relatively normal for age and body habitus Posture: WNL   Lower Extremity Exam    Side: Right lower extremity  Side: Left lower extremity  Stability: No instability observed  Stability: No instability observed          Skin & Extremity Inspection: Skin color, temperature, and hair growth are WNL. No peripheral edema or cyanosis. No masses, redness, swelling, asymmetry, or associated skin lesions. No contractures.  Skin & Extremity Inspection: Skin color, temperature, and hair growth are WNL. No peripheral edema or cyanosis. No masses, redness, swelling, asymmetry, or associated skin lesions. No contractures.  Functional ROM: Decreased ROM for hip joint          Functional ROM: Decreased ROM for hip joint          Muscle Tone/Strength: Functionally intact. No obvious neuro-muscular anomalies detected.  Muscle Tone/Strength: Functionally intact. No obvious neuro-muscular anomalies detected.  Sensory (Neurological): Neuropathic pain pattern        Sensory (Neurological): Neuropathic pain pattern        DTR: Patellar: deferred today Achilles: deferred today Plantar: deferred today  DTR: Patellar: deferred today Achilles: deferred today Plantar: deferred today  Palpation: No palpable anomalies  Palpation: No palpable anomalies    Assessment   Status Diagnosis  Persistent Persistent Persistent 1. Lumbar facet arthropathy (R>L)   2. Lumbar spondylosis   3. Chronic pain syndrome      Patient has been having a difficult time managing her pain.  She is experiencing increased axial low back pain which radiates down into her posterior lateral buttock and posterior lateral thigh in a dermatomal distribution.  She is status post 2 diagnostic lumbar facet interventional blocks bilaterally at L3, L4, L5, S1.  These blocks were beneficial for her and provided her with significant pain relief, rated as 100% for approximately 1-1/2 to 2 weeks.  Given her increased pain that has not been responsive to 2 sets of oral steroid tapers, recommend with right-sided lumbar radiofrequency ablation with the purpose of hopefully obtaining long-term pain benefit in regards to her axial low back pain.  Risks and benefits of this procedure were discussed in great detail and we will plan on doing right L3, L4, L5 RFA followed by the left.  In the interim, patient will receive intramuscular Norflex and Toradol to help out with her acute on chronic pain.  Plan of Care  Pharmacotherapy (Medications Ordered): Meds ordered this encounter  Medications  . orphenadrine (NORFLEX) injection 60 mg  . ketorolac (TORADOL) 30 MG/ML injection 30 mg   Medications administered today: We administered orphenadrine and ketorolac.  Orders:  Orders Placed This Encounter  Procedures  . Radiofrequency,Lumbar    Standing Status:   Future    Standing Expiration Date:   10/21/2020    Scheduling Instructions:     Side(s): RIGHT     Level: L3-4, L4-5, & L5-S1 Facets ( L3, L4, L5, & S1 Medial Branch Nerves)     Sedation: With Sedation     Scheduling Timeframe: As soon as pre-approved    Order Specific Question:   Where will this procedure be performed?    Answer:   [facility]   Planned follow-up:   Return in about 1 week (around 04/30/2019) for  Procedure R L3,4,5 S1 RFA with sedation.     Status post diagnostic bilateral L3, L4, L5, S1 facet medial branch nerve blocks #1 on 01/08/2019, return for #2    Recent Visits Date Type Provider Dept  03/24/19 Procedure visit Gillis Santa, MD [facility]  03/05/19 Office Visit Gillis Santa, MD [facility]  Showing recent visits within past 90 days and meeting all other requirements  Today's Visits Date Type Provider Dept  04/23/19 Office Visit Gillis Santa, MD [facility]  Showing today's visits and meeting all other requirements   Future Appointments Date Type Provider Dept  04/28/19 Appointment Gillis Santa, MD [facility]  05/01/19 Appointment Gillis Santa, MD [facility]  Showing future appointments within next 90 days and meeting all other requirements   Primary Care Physician: Ronnell Freshwater, NP Location: [facility] Note by: Gillis Santa, MD Date: 04/23/2019; Time: 1:58 PM  Note: This dictation was prepared with Dragon dictation. Any transcriptional errors that may result from this process are unintentional.

## 2019-04-23 NOTE — Telephone Encounter (Signed)
I'm going to need an order put in for an RFA. I couldn't find one in order review

## 2019-04-27 NOTE — Progress Notes (Signed)
Hey. Patient needs to have sooner appointment with me regarding lab results if possible. Thanks.

## 2019-04-28 ENCOUNTER — Telehealth: Payer: Self-pay

## 2019-04-28 ENCOUNTER — Ambulatory Visit: Payer: BC Managed Care – PPO | Admitting: Student in an Organized Health Care Education/Training Program

## 2019-04-28 NOTE — Progress Notes (Signed)
Left message and asked pt to call back and schedule appt to review labs. Beth

## 2019-04-28 NOTE — Telephone Encounter (Signed)
Left message and asked pt to call back regarding missed sleep study and discuss appt needed with Leretha Pol for lab results. Beth

## 2019-04-30 ENCOUNTER — Telehealth: Payer: Self-pay | Admitting: *Deleted

## 2019-04-30 ENCOUNTER — Other Ambulatory Visit: Payer: Self-pay

## 2019-04-30 ENCOUNTER — Encounter: Payer: Self-pay | Admitting: Student in an Organized Health Care Education/Training Program

## 2019-05-01 ENCOUNTER — Ambulatory Visit
Payer: BC Managed Care – PPO | Attending: Student in an Organized Health Care Education/Training Program | Admitting: Student in an Organized Health Care Education/Training Program

## 2019-05-01 ENCOUNTER — Encounter: Payer: Self-pay | Admitting: Student in an Organized Health Care Education/Training Program

## 2019-05-01 ENCOUNTER — Ambulatory Visit: Payer: BC Managed Care – PPO | Admitting: Internal Medicine

## 2019-05-01 ENCOUNTER — Other Ambulatory Visit: Payer: Self-pay

## 2019-05-01 DIAGNOSIS — M47816 Spondylosis without myelopathy or radiculopathy, lumbar region: Secondary | ICD-10-CM

## 2019-05-01 DIAGNOSIS — M5440 Lumbago with sciatica, unspecified side: Secondary | ICD-10-CM | POA: Diagnosis not present

## 2019-05-01 DIAGNOSIS — M5136 Other intervertebral disc degeneration, lumbar region: Secondary | ICD-10-CM | POA: Diagnosis not present

## 2019-05-01 DIAGNOSIS — G894 Chronic pain syndrome: Secondary | ICD-10-CM | POA: Diagnosis not present

## 2019-05-01 MED ORDER — PREGABALIN 50 MG PO CAPS: ORAL_CAPSULE | ORAL | 2 refills | Status: DC

## 2019-05-01 MED ORDER — OXYCODONE-ACETAMINOPHEN 5-325 MG PO TABS: 1.0000 | ORAL_TABLET | Freq: Two times a day (BID) | ORAL | 0 refills | Status: DC | PRN

## 2019-05-01 NOTE — Progress Notes (Signed)
Pain Management Virtual Encounter Note - Virtual Visit via [place] (real-time audio visits between healthcare provider and patient).   Patient's Phone No. & Preferred Pharmacy:  [phone] (home); [phone] (mobile); (Preferred) [phone] No e-mail address on record  [id number] - [place], [place] [address] [address] Phone: [phone] Fax: [phone]  [facility] [address]), [place] - [place] [address] Phone: [phone] Fax: [phone]    Pre-screening note:  Our staff contacted [name] and offered her an "in person", "face-to-face" appointment versus a telephone encounter. She indicated preferring the telephone encounter, at this time.   Reason for Virtual Visit: COVID-19*  Social distancing based on CDC and AMA recommendations.   I contacted Venancio Poisson on 05/01/2019 via video conference.      I clearly identified myself as Gillis Santa, MD. I verified that I was speaking with the correct person using two identifiers (Name: Candace Clayton, and date of birth: May 04, 1961).  Advanced Informed Consent I sought verbal advanced consent from Venancio Poisson for virtual visit interactions. I informed [name] of possible security and privacy concerns, risks, and limitations associated with providing "not-in-person" medical evaluation and management services. I also informed [name] of the availability of "in-person" appointments. Finally, I informed her that there would be a charge for the virtual visit and that she could be  personally, fully or partially, financially responsible for it. [name] expressed understanding and agreed to proceed.   Historic Elements   Candace Clayton is a 58 y.o. year old, female patient evaluated today after her last encounter by our practice on 04/30/2019. [name]  has a past medical  history of Anginal pain ([place]), Anxiety, Depression, GERD (gastroesophageal reflux disease), History of C-section (1992), Hypertension, and Sleep apnea. She also  has a past surgical history that includes Cesarean section (1992); Finger surgery (Left, 2011); Hematoma evacuation (Left, 1999); Colonoscopy; Laparoscopic salpingo oophorectomy (Left, 04/02/2018); and Colonoscopy with propofol (N/A, 03/14/2019). [name] has a current medication list which includes the following prescription(s): albuterol, budesonide-formoterol, buspirone, cetirizine, desvenlafaxine, enalapril, hydrochlorothiazide, hydroxyzine, multiple vitamins-minerals, NON FORMULARY, OVER THE COUNTER MEDICATION, pregabalin, buspirone, oxycodone-acetaminophen, oxycodone-acetaminophen, and oxycodone-acetaminophen. She  reports that she has never smoked. She has never used smokeless tobacco. She reports that she does not drink alcohol or use drugs. [name] is allergic to gabapentin.   HPI  Today, she is being contacted for medication management.   Patient continues to have persistence and worsening axial low back pain with radiation into her right hip and posterior thigh.  Patient is scheduled for right-sided L3, L4, L5, S1 radiofrequency ablation next week.  She is looking forward to this procedure.  She states that her medications are not managing her pain as well as she has helped.  We will increase her Lyrica as below.  I will also increase her Percocet as below.  I informed the patient that we may decrease her Percocet after her radiofrequency ablation if she is obtaining benefit from that procedure.  Patient endorsed understanding.  Pharmacotherapy Assessment  Analgesic: Percocet 5 mg twice daily as needed.  Will increase quantity from 35-->60 as patient is utilizing this on most days twice a day and on days that she works Monitoring: Pharmacotherapy: No side-effects or adverse reactions reported. [place] PMP: PDMP reviewed during this  encounter.       Compliance: No  problems identified. Effectiveness: Clinically acceptable. Plan: Refer to "POC".  UDS:  Summary  Date Value Ref Range Status  07/02/2018 FINAL  Final    Comment:    ==================================================================== [facility] COMP DRUG ANALYSIS,UR ==================================================================== Test                             Result       Flag       Units Drug Present and Declared for Prescription Verification   Alprazolam                     86           EXPECTED   ng/mg creat   Alpha-hydroxyalprazolam        72           EXPECTED   ng/mg creat    Source of alprazolam is a scheduled prescription medication.    Alpha-hydroxyalprazolam is an expected metabolite of alprazolam.   Desmethylvenlafaxine           PRESENT      EXPECTED    Desmethylvenlafaxine may be present due to administration of    desvenlafaxine; it is also an expected metabolite of venlafaxine. Drug Present not Declared for Prescription Verification   Salicylate                     PRESENT      UNEXPECTED   Naproxen                       PRESENT      UNEXPECTED Drug Absent but Declared for Prescription Verification   Ephedrine/Pseudoephedrine      Not Detected UNEXPECTED   Gabapentin                     Not Detected UNEXPECTED   Acetaminophen                  Not Detected UNEXPECTED    Acetaminophen, as indicated in the declared medication list, is    not always detected even when used as directed.   Diclofenac                     Not Detected UNEXPECTED    Diclofenac, as indicated in the declared medication list, is not    always detected even when used as directed.   Doxylamine                     Not Detected UNEXPECTED   Dextromethorphan               Not Detected UNEXPECTED ==================================================================== Test                      Result    Flag   Units      Ref Range   Creatinine              71                mg/dL      >=20 ==================================================================== Declared Medications:  The flagging and interpretation on this report are based on the  following declared medications.  Unexpected results may arise from  inaccuracies in the declared medications.  **Note: The testing scope of this panel includes these medications:  Alprazolam  Desvenlafaxine  Dextromethorphan  Doxylamine  Gabapentin  Pseudoephedrine  **Note: The testing scope of this panel does not include small to  moderate amounts of these reported medications:  Acetaminophen  Diclofenac  **Note: The testing scope of this panel does not include following  reported medications:  Acyclovir  Calcium  Cetirizine  Docusate (Stool Softener)  Enalapril  Hydrochlorothiazide  Multivitamin ==================================================================== For clinical consultation, please call [phone]. ====================================================================    Laboratory Chemistry Profile (12 mo)  Renal: 03/06/2019: BUN 12; BUN/Creatinine Ratio 15; Creatinine, Ser 0.80  Lab Results  Component Value Date   GFRAA 94 03/06/2019   GFRNONAA 82 03/06/2019   Hepatic: 03/06/2019: Albumin 3.9 Lab Results  Component Value Date   AST 26 03/06/2019   ALT 30 03/06/2019   Other: 03/06/2019: Vit D, 25-Hydroxy 17.2 Note: Above Lab results reviewed.  Imaging  Last 90 days:  US Transvaginal Non-ob  Result Date: 04/03/2019 Patient Name: CLIFTON GRULLON DOB: [birth year] MRN: [id number] ULTRASOUND REPORT Location: [facility] Date of Service: 04/03/2019 Indications:Pelvic Pain Findings: The uterus is anteverted and measures 8.5 x 4.7 x 3.9 cm. Echo texture is homogenous without evidence of focal masses. The Endometrium measures 9.1 mm. Right Ovary is not visible. Left Ovary is not visible. Survey of the adnexa demonstrates no adnexal masses. There is no free fluid in the cul de sac.  Impression: 1. Incidental endometrial thickening 2. Neither ovary is visible. Recommendations: 1.Clinical correlation with the patient's History and Physical Exam. Gweneth Dimitri, RT Images reviewed.  Normal GYN study without visualized pathology.  Incidental endometrial thickening noted.  An endometrial measurement greater than 4 mm that is incidentally discovered in a postmenopausal patient without bleeding need not routinely trigger evaluation, although an individualized assessment based on patient characteristics and risk factors is appropriate. Thus, transvaginal ultrasonography is not an appropriate screening tool for endometrial cancer in postmenopausal women without bleeding.  ACOG Committee Opinion 734, May 2018 "The Role of Transvaginal Ultrasonography in Evaluating the Endometrium of Women with Postmenopausal Bleeding" Malachy Mood, MD, [place], [place] 04/03/2019, 9:31 AM   [facility] C-arm 1-60 Min No Report  Result Date: 03/24/2019 Fluoro was used, but no Radiologist interpretation will be provided. Please refer to "NOTES" tab for provider progress note.  Mm 3d Screen Breast Bilateral  Result Date: 03/07/2019 CLINICAL DATA:  Screening. EXAM: DIGITAL SCREENING BILATERAL MAMMOGRAM WITH TOMO AND CAD COMPARISON:  Previous exam(s). ACR Breast Density Category b: There are scattered areas of fibroglandular density. FINDINGS: There are no findings suspicious for malignancy. Images were processed with CAD. IMPRESSION: No mammographic evidence of malignancy. A result letter of this screening mammogram will be mailed directly to the patient. RECOMMENDATION: Screening mammogram in one year. (Code:SM-B-01Y) BI-RADS CATEGORY  1: Negative. Electronically Signed   By: Franki Cabot M.D.   On: 03/07/2019 09:28    Assessment  The primary encounter diagnosis was Chronic pain syndrome. Diagnoses of Lumbar facet arthropathy (R>L), Lumbar spondylosis, Low back pain with  sciatica, sciatica laterality unspecified, unspecified back pain laterality, unspecified chronicity, Lumbar degenerative disc disease, and Morbid obesity ([place]) were also pertinent to this visit.  Plan of Care  I have discontinued Latrenda G. Wortmann's predniSONE. I have also changed her pregabalin. Additionally, I am having her start on oxyCODONE-acetaminophen, oxyCODONE-acetaminophen, and oxyCODONE-acetaminophen. Lastly, I am having her maintain her Multiple Vitamins-Calcium (ONE-A-DAY WOMENS PO), OVER THE COUNTER MEDICATION, hydrOXYzine, enalapril, NON FORMULARY, busPIRone, desvenlafaxine, hydrochlorothiazide, budesonide-formoterol, albuterol, cetirizine, and busPIRone.  Lumbar radiofrequency ablation next week. Medication management as below.  Pharmacotherapy (Medications Ordered): Meds ordered this encounter  Medications  . pregabalin (LYRICA) 50 MG capsule    Sig: 50 mg during day, 100 mg qhs    Dispense:  90 capsule    Refill:  2  . oxyCODONE-acetaminophen (PERCOCET) 5-325 MG tablet    Sig: Take 1 tablet by mouth every 12 (twelve) hours as needed for severe pain. Must last 30 days.    Dispense:  60 tablet    Refill:  0    Chronic Pain. (STOP Act - Not applicable). Fill one day early if closed on scheduled refill date.  Marland Kitchen oxyCODONE-acetaminophen (PERCOCET) 5-325 MG tablet    Sig: Take 1 tablet by mouth every 12 (twelve) hours as needed for severe pain. Must last 30 days.    Dispense:  60 tablet    Refill:  0    Chronic Pain. (STOP Act - Not applicable). Fill one day early if closed on scheduled refill date.  Marland Kitchen oxyCODONE-acetaminophen (PERCOCET) 5-325 MG tablet    Sig: Take 1 tablet by mouth every 12 (twelve) hours as needed for severe pain. Must last 30 days.    Dispense:  60 tablet    Refill:  0    Chronic Pain. (STOP Act - Not applicable). Fill one day early if closed on scheduled refill date.   Orders:  No orders of the defined types were placed in this encounter.  Follow-up  plan:   Return in about 3 months (around 08/01/2019) for Medication Management.    Recent Visits Date Type Provider Dept  04/23/19 Office Visit Gillis Santa, MD [facility]  03/24/19 Procedure visit Gillis Santa, MD [facility]  03/05/19 Office Visit Gillis Santa, MD [facility]  Showing recent visits within past 90 days and meeting all other requirements   Today's Visits Date Type Provider Dept  05/01/19 Office Visit Gillis Santa, MD [facility]  Showing today's visits and meeting all other requirements   Future Appointments Date Type Provider Dept  05/05/19 Appointment Gillis Santa, MD [facility]  Showing future appointments within next 90 days and meeting all other requirements   I discussed the assessment and treatment plan with the patient. The patient was provided an opportunity to ask questions and all were answered. The patient agreed with the plan and demonstrated an understanding of the instructions.  Patient advised to call back or seek an in-person evaluation if the symptoms or condition worsens.  Total duration of non-face-to-face encounter: 25 minutes.  Note by: Gillis Santa, MD Date: 05/01/2019; Time: 1:00 PM  Note: This dictation was prepared with Dragon dictation. Any transcriptional errors that may result from this process are unintentional.  Disclaimer:  * Given the special circumstances of the COVID-19 pandemic, the federal government has announced that the Office for Civil Rights (OCR) will exercise its enforcement discretion and will not impose penalties on physicians using telehealth in the event of noncompliance with regulatory requirements under the [place] and [place] (HIPAA) in connection with the good faith provision of telehealth during the XX123456 national public health emergency. ([place])

## 2019-05-02 ENCOUNTER — Other Ambulatory Visit: Payer: Self-pay

## 2019-05-02 DIAGNOSIS — F331 Major depressive disorder, recurrent, moderate: Secondary | ICD-10-CM

## 2019-05-05 ENCOUNTER — Other Ambulatory Visit: Payer: Self-pay | Admitting: Adult Health

## 2019-05-05 ENCOUNTER — Ambulatory Visit
Admission: RE | Admit: 2019-05-05 | Discharge: 2019-05-05 | Disposition: A | Discharge status: Home / Self Care | Payer: BC Managed Care – PPO | Source: Ambulatory Visit | Attending: Student in an Organized Health Care Education/Training Program | Admitting: Student in an Organized Health Care Education/Training Program

## 2019-05-05 ENCOUNTER — Other Ambulatory Visit: Payer: Self-pay

## 2019-05-05 ENCOUNTER — Encounter: Payer: Self-pay | Admitting: Student in an Organized Health Care Education/Training Program

## 2019-05-05 ENCOUNTER — Ambulatory Visit (HOSPITAL_BASED_OUTPATIENT_CLINIC_OR_DEPARTMENT_OTHER): Payer: BC Managed Care – PPO | Admitting: Student in an Organized Health Care Education/Training Program

## 2019-05-05 VITALS — BP 138/86 | HR 90 | Temp 98.6°F | Resp 18 | Ht 69.0 in | Wt 348.0 lb

## 2019-05-05 DIAGNOSIS — M47816 Spondylosis without myelopathy or radiculopathy, lumbar region: Secondary | ICD-10-CM | POA: Diagnosis not present

## 2019-05-05 DIAGNOSIS — F331 Major depressive disorder, recurrent, moderate: Secondary | ICD-10-CM

## 2019-05-05 MED ORDER — MIDAZOLAM HCL 5 MG/5ML IJ SOLN
1.0000 mg | INTRAMUSCULAR | Status: DC | PRN
  Administered 2019-05-05: 1 mg via INTRAVENOUS
  Filled 2019-05-05: qty 5

## 2019-05-05 MED ORDER — LIDOCAINE HCL 2 % IJ SOLN
20.0000 mL | Freq: Once | INTRAMUSCULAR | Status: AC
  Administered 2019-05-05: 400 mg

## 2019-05-05 MED ORDER — ROPIVACAINE HCL 2 MG/ML IJ SOLN
2.0000 mL | Freq: Once | INTRAMUSCULAR | Status: AC
  Administered 2019-05-05: 2 mL via EPIDURAL

## 2019-05-05 MED ORDER — DEXAMETHASONE SODIUM PHOSPHATE 10 MG/ML IJ SOLN
10.0000 mg | Freq: Once | INTRAMUSCULAR | Status: AC
  Administered 2019-05-05: 10 mg
  Filled 2019-05-05: qty 1

## 2019-05-05 MED ORDER — DEXAMETHASONE SODIUM PHOSPHATE 10 MG/ML IJ SOLN
10.0000 mg | Freq: Once | INTRAMUSCULAR | Status: AC
  Administered 2019-05-05: 10 mg

## 2019-05-05 MED ORDER — DESVENLAFAXINE SUCCINATE ER 100 MG PO TB24: 100.0000 mg | ORAL_TABLET | Freq: Every day | ORAL | 1 refills | Status: DC

## 2019-05-05 MED ORDER — ROPIVACAINE HCL 2 MG/ML IJ SOLN
1.0000 mL | Freq: Once | INTRAMUSCULAR | Status: AC
  Administered 2019-05-05: 1 mL via EPIDURAL
  Filled 2019-05-05: qty 10

## 2019-05-05 MED ORDER — FENTANYL CITRATE (PF) 100 MCG/2ML IJ SOLN
INTRAMUSCULAR | Status: AC
  Filled 2019-05-05: qty 2

## 2019-05-05 MED ORDER — DEXAMETHASONE SODIUM PHOSPHATE 10 MG/ML IJ SOLN
INTRAMUSCULAR | Status: AC
  Filled 2019-05-05: qty 1

## 2019-05-05 MED ORDER — ROPIVACAINE HCL 2 MG/ML IJ SOLN
INTRAMUSCULAR | Status: AC
  Filled 2019-05-05: qty 10

## 2019-05-05 MED ORDER — LIDOCAINE HCL 2 % IJ SOLN
INTRAMUSCULAR | Status: AC
  Filled 2019-05-05: qty 20

## 2019-05-05 MED ORDER — FENTANYL CITRATE (PF) 100 MCG/2ML IJ SOLN
25.0000 ug | INTRAMUSCULAR | Status: DC | PRN
  Administered 2019-05-05: 25 ug via INTRAVENOUS

## 2019-05-05 NOTE — Progress Notes (Signed)
Safety precautions to be maintained throughout the outpatient stay will include: orient to surroundings, keep bed in low position, maintain call bell within reach at all times, provide assistance with transfer out of bed and ambulation.  

## 2019-05-05 NOTE — Progress Notes (Signed)
Patient's Name: Candace Clayton  MRN: [id number]  Referring Provider: Ronnell Freshwater, NP  DOB: Feb 16, 1961  PCP: Ronnell Freshwater, NP  DOS: 05/05/2019  Note by: Gillis Santa, MD  Service setting: Ambulatory outpatient  Specialty: Interventional Pain Management  Patient type: Established  Location: [facility] (AMB) Pain Management Facility  Visit type: Interventional Procedure   Primary Reason for Visit: Interventional Pain Management Treatment. CC: Back Pain  Procedure:          Anesthesia, Analgesia, Anxiolysis:  Type: Thermal Lumbar Facet, Medial Branch Radiofrequency Ablation/Neurotomy  #1  Primary Purpose: Therapeutic Region: Posterolateral Lumbosacral Spine Level:  L3, L4, L5, & S1 Medial Branch Level(s). These levels will denervate the L3-4, L4-5, and the L5-S1 lumbar facet joints. Laterality: Right  Type: Moderate (Conscious) Sedation combined with Local Anesthesia Indication(s): Analgesia and Anxiety Route: Intravenous (IV) IV Access: Secured Sedation: Meaningful verbal contact was maintained at all times during the procedure  Local Anesthetic: Lidocaine 1-2%  Position: Prone   Indications: 1. Lumbar facet arthropathy (R>L)    Candace Clayton has been dealing with the above chronic pain for longer than three months and has either failed to respond, was unable to tolerate, or simply did not get enough benefit from other more conservative therapies including, but not limited to: 1. Over-the-counter medications 2. Anti-inflammatory medications 3. Muscle relaxants 4. Membrane stabilizers 5. Opioids 6. Physical therapy and/or chiropractic manipulation 7. Modalities (Heat, ice, etc.) 8. Invasive techniques such as nerve blocks. Candace Clayton has attained more than 50% relief of the pain from a series of diagnostic injections conducted in separate occasions.  Pain Score: Pre-procedure: 6 /10 Post-procedure: 0-No pain/10  Pre-op Assessment:  Candace Clayton is a 58 y.o. (year old), female  patient, seen today for interventional treatment. She  has a past surgical history that includes Cesarean section (1992); Finger surgery (Left, 2011); Hematoma evacuation (Left, 1999); Colonoscopy; Laparoscopic salpingo oophorectomy (Left, 04/02/2018); and Colonoscopy with propofol (N/A, 03/14/2019). Candace Clayton has a current medication list which includes the following prescription(s): albuterol, budesonide-formoterol, buspirone, cetirizine, enalapril, hydrochlorothiazide, hydroxyzine, multiple vitamins-minerals, NON FORMULARY, OVER THE COUNTER MEDICATION, oxycodone-acetaminophen, oxycodone-acetaminophen, oxycodone-acetaminophen, pregabalin, buspirone, and desvenlafaxine, and the following Facility-Administered Medications: fentanyl and midazolam. Her primarily concern today is the Back Pain  Initial Vital Signs:  Pulse/HCG Rate: 90ECG Heart Rate: 86 Temp: 99.5 F (37.5 C) Resp: 15 BP: (!) 142/87 SpO2: 99 %  BMI: Estimated body mass index is 51.39 kg/m as calculated from the following:   Height as of this encounter: 5\' 9"  (1.753 m).   Weight as of this encounter: 348 lb (157.9 kg).  Risk Assessment: Allergies: Reviewed. She is allergic to gabapentin.  Allergy Precautions: None required Coagulopathies: Reviewed. None identified.  Blood-thinner therapy: None at this time Active Infection(s): Reviewed. None identified. Candace Clayton is afebrile  Site Confirmation: Candace Clayton was asked to confirm the procedure and laterality before marking the site Procedure checklist: Completed Consent: Before the procedure and under the influence of no sedative(s), amnesic(s), or anxiolytics, the patient was informed of the treatment options, risks and possible complications. To fulfill our ethical and legal obligations, as recommended by the American Medical Association's Code of Ethics, I have informed the patient of my clinical impression; the nature and purpose of the treatment or procedure; the risks,  benefits, and possible complications of the intervention; the alternatives, including doing nothing; the risk(s) and benefit(s) of the alternative treatment(s) or procedure(s); and the risk(s) and benefit(s) of doing nothing. The patient was provided information  about the general risks and possible complications associated with the procedure. These may include, but are not limited to: failure to achieve desired goals, infection, bleeding, organ or nerve damage, allergic reactions, paralysis, and death. In addition, the patient was informed of those risks and complications associated to Spine-related procedures, such as failure to decrease pain; infection (i.e.: Meningitis, epidural or intraspinal abscess); bleeding (i.e.: epidural hematoma, subarachnoid hemorrhage, or any other type of intraspinal or peri-dural bleeding); organ or nerve damage (i.e.: Any type of peripheral nerve, nerve root, or spinal cord injury) with subsequent damage to sensory, motor, and/or autonomic systems, resulting in permanent pain, numbness, and/or weakness of one or several areas of the body; allergic reactions; (i.e.: anaphylactic reaction); and/or death. Furthermore, the patient was informed of those risks and complications associated with the medications. These include, but are not limited to: allergic reactions (i.e.: anaphylactic or anaphylactoid reaction(s)); adrenal axis suppression; blood sugar elevation that in diabetics may result in ketoacidosis or comma; water retention that in patients with history of congestive heart failure may result in shortness of breath, pulmonary edema, and decompensation with resultant heart failure; weight gain; swelling or edema; medication-induced neural toxicity; particulate matter embolism and blood vessel occlusion with resultant organ, and/or nervous system infarction; and/or aseptic necrosis of one or more joints. Finally, the patient was informed that Medicine is not an exact science;  therefore, there is also the possibility of unforeseen or unpredictable risks and/or possible complications that may result in a catastrophic outcome. The patient indicated having understood very clearly. We have given the patient no guarantees and we have made no promises. Enough time was given to the patient to ask questions, all of which were answered to the patient's satisfaction. Ms. Hocutt has indicated that she wanted to continue with the procedure. Attestation: I, the ordering provider, attest that I have discussed with the patient the benefits, risks, side-effects, alternatives, likelihood of achieving goals, and potential problems during recovery for the procedure that I have provided informed consent. Date  Time: 05/05/2019  8:47 AM  Pre-Procedure Preparation:  Monitoring: As per clinic protocol. Respiration, ETCO2, SpO2, BP, heart rate and rhythm monitor placed and checked for adequate function Safety Precautions: Patient was assessed for positional comfort and pressure points before starting the procedure. Time-out: I initiated and conducted the "Time-out" before starting the procedure, as per protocol. The patient was asked to participate by confirming the accuracy of the "Time Out" information. Verification of the correct person, site, and procedure were performed and confirmed by me, the nursing staff, and the patient. "Time-out" conducted as per Joint Commission's Universal Protocol (UP.01.01.01). Time: 0928  Description of Procedure:          Laterality: Right Levels:   L3, L4, L5, & S1 Medial Branch Level(s), at the L3-4, L4-5, and the L5-S1 lumbar facet joints. Area Prepped: Lumbosacral Prepping solution: DuraPrep (Iodine Povacrylex [0.7% available iodine] and Isopropyl Alcohol, 74% w/w) Safety Precautions: Aspiration looking for blood return was conducted prior to all injections. At no point did we inject any substances, as a needle was being advanced. Before injecting, the  patient was told to immediately notify me if she was experiencing any new onset of "ringing in the ears, or metallic taste in the mouth". No attempts were made at seeking any paresthesias. Safe injection practices and needle disposal techniques used. Medications properly checked for expiration dates. SDV (single dose vial) medications used. After the completion of the procedure, all disposable equipment used was discarded in the  proper designated Insurance risk surveyor. Local Anesthesia: Protocol guidelines were followed. The patient was positioned over the fluoroscopy table. The area was prepped in the usual manner. The time-out was completed. The target area was identified using fluoroscopy. A 12-in long, straight, sterile hemostat was used with fluoroscopic guidance to locate the targets for each level blocked. Once located, the skin was marked with an approved surgical skin marker. Once all sites were marked, the skin (epidermis, dermis, and hypodermis), as well as deeper tissues (fat, connective tissue and muscle) were infiltrated with a small amount of a short-acting local anesthetic, loaded on a 10cc syringe with a 25G, 1.5-in  Needle. An appropriate amount of time was allowed for local anesthetics to take effect before proceeding to the next step. Local Anesthetic: Lidocaine 2.0% The unused portion of the local anesthetic was discarded in the proper designated containers. Technical explanation of process:  Radiofrequency Ablation (RFA)  L3 Medial Branch Nerve RFA: The target area for the L3 medial branch is at the junction of the postero-lateral aspect of the superior articular process and the superior, posterior, and medial edge of the transverse process of L4. Under fluoroscopic guidance, a Radiofrequency needle was inserted until contact was made with os over the superior postero-lateral aspect of the pedicular shadow (target area). Sensory and motor testing was conducted to properly adjust the  position of the needle. Once satisfactory placement of the needle was achieved, the numbing solution was slowly injected after negative aspiration for blood. 1 mL of the nerve block solution was injected without difficulty or complication. After waiting for at least 3 minutes, the ablation was performed. Once completed, the needle was removed intact. L4 Medial Branch Nerve RFA: The target area for the L4 medial branch is at the junction of the postero-lateral aspect of the superior articular process and the superior, posterior, and medial edge of the transverse process of L5. Under fluoroscopic guidance, a Radiofrequency needle was inserted until contact was made with os over the superior postero-lateral aspect of the pedicular shadow (target area). Sensory and motor testing was conducted to properly adjust the position of the needle. Once satisfactory placement of the needle was achieved, the numbing solution was slowly injected after negative aspiration for blood. 1 mL of the nerve block solution was injected without difficulty or complication. After waiting for at least 3 minutes, the ablation was performed. Once completed, the needle was removed intact. L5 Medial Branch Nerve RFA: The target area for the L5 medial branch is at the junction of the postero-lateral aspect of the superior articular process of S1 and the superior, posterior, and medial edge of the sacral ala. Under fluoroscopic guidance, a Radiofrequency needle was inserted until contact was made with os over the superior postero-lateral aspect of the pedicular shadow (target area). Sensory and motor testing was conducted to properly adjust the position of the needle. Once satisfactory placement of the needle was achieved, the numbing solution was slowly injected after negative aspiration for blood. 84mL of the nerve block solution was injected without difficulty or complication. After waiting for at least 3 minutes, the ablation was performed. Once  completed, the needle was removed intact. S1 Medial Branch Nerve RFA: The target area for the S1 medial branch is located inferior to the junction of the S1 superior articular process and the L5 inferior articular process, posterior, inferior, and lateral to the 6 o'clock position of the L5-S1 facet joint, just superior to the S1 posterior foramen. Under fluoroscopic guidance, the Radiofrequency  needle was advanced until contact was made with os over the Target area. Sensory and motor testing was conducted to properly adjust the position of the needle. Once satisfactory placement of the needle was achieved, the numbing solution was slowly injected after negative aspiration for blood. 42mL of the nerve block solution was injected without difficulty or complication. After waiting for at least 3 minutes, the ablation was performed. Once completed, the needle was removed intact. Radiofrequency lesioning (ablation):  Radiofrequency Generator: NeuroTherm [id number] Sensory Stimulation Parameters: 50 Hz was used to locate & identify the nerve, making sure that the needle was positioned such that there was no sensory stimulation below 0.3 V or above 0.7 V. Motor Stimulation Parameters: 2 Hz was used to evaluate the motor component. Care was taken not to lesion any nerves that demonstrated motor stimulation of the lower extremities at an output of less than 2.5 times that of the sensory threshold, or a maximum of 2.0 V. Lesioning Technique Parameters: Standard Radiofrequency settings. (Not bipolar or pulsed.) Temperature Settings: 80 degrees C Lesioning time: 60 seconds Intra-operative Compliance: Compliant Materials & Medications: Needle(s) (Electrode/Cannula) Type: Teflon-coated, curved tip, Radiofrequency needle(s) Gauge: 22G Length: 10cm Numbing solution: 5 cc solution made of 2 cc of 0.2% ropivacaine, 2 cc of Decadron 10 mg/cc.  1 cc injected at each level above on the right after sensorimotor testing prior to  lesioning.  The unused portion of the solution was discarded in the proper designated containers.  Once the entire procedure was completed, the treated area was cleaned, making sure to leave some of the prepping solution back to take advantage of its long term bactericidal properties.  Illustration of the posterior view of the lumbar spine and the posterior neural structures. Laminae of L2 through S1 are labeled. DPRL5, dorsal primary ramus of L5; DPRS1, dorsal primary ramus of S1; DPR3, dorsal primary ramus of L3; FJ, facet (zygapophyseal) joint L3-L4; I, inferior articular process of L4; LB1, lateral branch of dorsal primary ramus of L1; IAB, inferior articular branches from L3 medial branch (supplies L4-L5 facet joint); IBP, intermediate branch plexus; MB3, medial branch of dorsal primary ramus of L3; NR3, third lumbar nerve root; S, superior articular process of L5; SAB, superior articular branches from L4 (supplies L4-5 facet joint also); TP3, transverse process of L3.  Vitals:   05/05/19 1002 05/05/19 1012 05/05/19 1022 05/05/19 1030  BP: (!) 150/81 (!) 146/67 140/74 138/86  Pulse:      Resp: (!) 22 17 18 18   Temp: 98.6 F (37 C)     TempSrc: Temporal     SpO2: 97% 99% 99% 99%  Weight:      Height:        Start Time: 0928 hrs. End Time: 0957 hrs.  Imaging Guidance (Spinal):          Type of Imaging Technique: Fluoroscopy Guidance (Spinal) Indication(s): Assistance in needle guidance and placement for procedures requiring needle placement in or near specific anatomical locations not easily accessible without such assistance. Exposure Time: Please see nurses notes. Contrast: None used. Fluoroscopic Guidance: I was personally present during the use of fluoroscopy. "Tunnel Vision Technique" used to obtain the best possible view of the target area. Parallax error corrected before commencing the procedure. "Direction-depth-direction" technique used to introduce the needle under continuous  pulsed fluoroscopy. Once target was reached, antero-posterior, oblique, and lateral fluoroscopic projection used confirm needle placement in all planes. Images permanently stored in EMR. Interpretation: No contrast injected. I personally interpreted the imaging  intraoperatively. Adequate needle placement confirmed in multiple planes. Permanent images saved into the patient's record.  Antibiotic Prophylaxis:   Anti-infectives (From admission, onward)   None     Indication(s): None identified  Post-operative Assessment:  Post-procedure Vital Signs:  Pulse/HCG Rate: 9082 Temp: 98.6 F (37 C) Resp: 18 BP: 138/86 SpO2: 99 %  EBL: None  Complications: No immediate post-treatment complications observed by team, or reported by patient.  Note: The patient tolerated the entire procedure well. A repeat set of vitals were taken after the procedure and the patient was kept under observation following institutional policy, for this type of procedure. Post-procedural neurological assessment was performed, showing return to baseline, prior to discharge. The patient was provided with post-procedure discharge instructions, including a section on how to identify potential problems. Should any problems arise concerning this procedure, the patient was given instructions to immediately contact us, at any time, without hesitation. In any case, we plan to contact the patient by telephone for a follow-up status report regarding this interventional procedure.  Comments:  No additional relevant information.  Plan of Care  Orders:  Orders Placed This Encounter  Procedures  . RFA - Lumbar Facet (Schedule)    Standing Status:   Future    Standing Expiration Date:   11/01/2020    Scheduling Instructions:     Side(s): Left-sided     Level: L3-4, L4-5, & L5-S1 Facets ( L3, L4, L5, & S1 Medial Branch Nerves)     Sedation: With Sedation     Scheduling Timeframe: As soon as pre-approved    Order Specific Question:    Where will this procedure be performed?    Answer:   [facility]  . Fluoro (C-Arm) (<60 min) (No Report)    Intraoperative interpretation by procedural physician at [place].    Standing Status:   Standing    Number of Occurrences:   1    Order Specific Question:   Reason for exam:    Answer:   Assistance in needle guidance and placement for procedures requiring needle placement in or near specific anatomical locations not easily accessible without such assistance.   Medications ordered for procedure: Meds ordered this encounter  Medications  . lidocaine (XYLOCAINE) 2 % (with pres) injection 400 mg  . fentaNYL (SUBLIMAZE) injection 25-50 mcg    Make sure Narcan is available in the pyxis when using this medication. In the event of respiratory depression (RR< 8/min): Titrate NARCAN (naloxone) in increments of 0.1 to 0.2 mg IV at 2-3 minute intervals, until desired degree of reversal.  . midazolam (VERSED) 5 MG/5ML injection 1-2 mg    Make sure Flumazenil is available in the pyxis when using this medication. If oversedation occurs, administer 0.2 mg IV over 15 sec. If after 45 sec no response, administer 0.2 mg again over 1 min; may repeat at 1 min intervals; not to exceed 4 doses (1 mg)  . ropivacaine (PF) 2 mg/mL (0.2%) (NAROPIN) injection 1 mL  . ropivacaine (PF) 2 mg/mL (0.2%) (NAROPIN) injection 2 mL  . dexamethasone (DECADRON) injection 10 mg  . dexamethasone (DECADRON) injection 10 mg   Medications administered: We administered lidocaine, fentaNYL, midazolam, ropivacaine (PF) 2 mg/mL (0.2%), ropivacaine (PF) 2 mg/mL (0.2%), dexamethasone, and dexamethasone.  See the medical record for exact dosing, route, and time of administration.  Follow-up plan:   Return in about 2 weeks (around 05/19/2019) for Procedure Left L3,4,5 S1 RFA.      Status post diagnostic bilateral  L3, L4, L5, S1 facet medial branch nerve blocks, status post right L3, L4, L5, S1 RFA on  05/05/2019; return for the left.    Recent Visits Date Type Provider Dept  05/01/19 Office Visit Gillis Santa, MD [facility]  04/23/19 Office Visit Gillis Santa, MD [facility]  03/24/19 Procedure visit Gillis Santa, MD [facility]  03/05/19 Office Visit Gillis Santa, MD [facility]  Showing recent visits within past 90 days and meeting all other requirements   Today's Visits Date Type Provider Dept  05/05/19 Procedure visit Gillis Santa, MD [facility]  Showing today's visits and meeting all other requirements   Future Appointments Date Type Provider Dept  07/31/19 Appointment Gillis Santa, MD [facility]  Showing future appointments within next 90 days and meeting all other requirements   Disposition: Discharge home  Discharge Date & Time: 05/05/2019; 1035 hrs.   Primary Care Physician: Ronnell Freshwater, NP Location: [facility] Note by: Gillis Santa, MD Date: 05/05/2019; Time: 1:01 PM  Disclaimer:  Medicine is not an exact science. The only guarantee in medicine is that nothing is guaranteed. It is important to note that the decision to proceed with this intervention was based on the information collected from the patient. The Data and conclusions were drawn from the patient's questionnaire, the interview, and the physical examination. Because the information was provided in large part by the patient, it cannot be guaranteed that it has not been purposely or unconsciously manipulated. Every effort has been made to obtain as much relevant data as possible for this evaluation. It is important to note that the conclusions that lead to this procedure are derived in large part from the available data. Always take into account that the treatment will also be dependent on availability of resources and existing treatment guidelines, considered by other Pain Management Practitioners as being common  knowledge and practice, at the time of the intervention. For Medico-Legal purposes, it is also important to point out that variation in procedural techniques and pharmacological choices are the acceptable norm. The indications, contraindications, technique, and results of the above procedure should only be interpreted and judged by a Board-Certified Interventional Pain Specialist with extensive familiarity and expertise in the same exact procedure and technique.

## 2019-05-05 NOTE — Patient Instructions (Signed)
Radiofrequency Lesioning Radiofrequency lesioning is a procedure that is performed to relieve pain. The procedure is often used for back, neck, or arm pain. Radiofrequency lesioning involves the use of a machine that creates radio waves to make heat. During the procedure, the heat is applied to the nerve that carries the pain signal. The heat damages the nerve and interferes with the pain signal. Pain relief usually starts about 2 weeks after the procedure and lasts for 6 months to 1 year. You will be awake during the procedure. You will need to be able to talk with the health care provider during the procedure. Tell a health care provider about:  Any allergies you have.  All medicines you are taking, including vitamins, herbs, eye drops, creams, and over-the-counter medicines.  Any problems you or family members have had with anesthetic medicines.  Any blood disorders you have.  Any surgeries you have had.  Any medical conditions you have or have had.  Whether you are pregnant or may be pregnant. What are the risks? Generally, this is a safe procedure. However, problems may occur, including:  Pain or soreness at the injection site.  Allergic reaction to medicines given during the procedure.  Bleeding.  Infection at the injection site.  Damage to nerves or blood vessels. What happens before the procedure? Staying hydrated Follow instructions from your health care provider about hydration, which may include:  Up to 2 hours before the procedure - you may continue to drink clear liquids, such as water, clear fruit juice, black coffee, and plain tea. Eating and drinking Follow instructions from your health care provider about eating and drinking, which may include:  8 hours before the procedure - stop eating heavy meals or foods, such as meat, fried foods, or fatty foods.  6 hours before the procedure - stop eating light meals or foods, such as toast or cereal.  6 hours before  the procedure - stop drinking milk or drinks that contain milk.  2 hours before the procedure - stop drinking clear liquids. Medicines Ask your health care provider about:  Changing or stopping your regular medicines. This is especially important if you are taking diabetes medicines or blood thinners.  Taking medicines such as aspirin and ibuprofen. These medicines can thin your blood. Do not take these medicines unless your health care provider tells you to take them.  Taking over-the-counter medicines, vitamins, herbs, and supplements. General instructions  Plan to have someone take you home from the hospital or clinic.  If you will be going home right after the procedure, plan to have someone with you for 24 hours.  Ask your health care provider what steps will be taken to help prevent infection. These may include: ? Removing hair at the procedure site. ? Washing skin with a germ-killing soap. ? Taking antibiotic medicine. What happens during the procedure?   An IV will be inserted into one of your veins.  You will be given one or more of the following: ? A medicine to help you relax (sedative). ? A medicine to numb the area (local anesthetic).  Your health care provider will insert a radiofrequency needle into the area to be treated. This is done with the help of a type of X-ray (fluoroscopy).  A wire that carries the radio waves (electrode) will be put through the radiofrequency needle.  An electrical pulse will be sent through the electrode to verify the correct nerve that is causing your pain. You will feel a tingling sensation,   and you may have muscle twitching.  The tissue around the needle tip will be heated by an electric current that comes from the radiofrequency machine. This will numb the nerves.  The needle will be removed.  A bandage (dressing) will be put on the insertion area. The procedure may vary among health care providers and hospitals. What happens  after the procedure?  Your blood pressure, heart rate, breathing rate, and blood oxygen level will be monitored until you leave the hospital or clinic.  Return to your normal activities as told by your health care provider. Ask your health care provider what activities are safe for you.  Do not drive for 24 hours if you were given a sedative during your procedure. Summary  Radiofrequency lesioning is a procedure that is performed to relieve pain. The procedure is often used for back, neck, or arm pain.  Radiofrequency lesioning involves the use of a machine that creates radio waves to make heat.  Plan to have someone take you home from the hospital or clinic. Do not drive for 24 hours if you were given a sedative during your procedure.  Return to your normal activities as told by your health care provider. Ask your health care provider what activities are safe for you. This information is not intended to replace advice given to you by your health care provider. Make sure you discuss any questions you have with your health care provider. Document Released: 02/08/2011 Document Revised: 02/28/2018 Document Reviewed: 02/28/2018 Elsevier Patient Education  [place]. Pain Management Discharge Instructions  General Discharge Instructions :  If you need to reach your doctor call: Monday-Friday 8:00 am - 4:00 pm at [phone] or toll free [phone].  After clinic hours [phone] to have operator reach doctor.  Bring all of your medication bottles to all your appointments in the pain clinic.  To cancel or reschedule your appointment with Pain Management please remember to call 24 hours in advance to avoid a fee.  Refer to the educational materials which you have been given on: General Risks, I had my Procedure. Discharge Instructions, Post Sedation.  Post Procedure Instructions:  The drugs you were given will stay in your system until tomorrow, so for the next 24 hours you  should not drive, make any legal decisions or drink any alcoholic beverages.  You may eat anything you prefer, but it is better to start with liquids then soups and crackers, and gradually work up to solid foods.  Please notify your doctor immediately if you have any unusual bleeding, trouble breathing or pain that is not related to your normal pain.  Depending on the type of procedure that was done, some parts of your body may feel week and/or numb.  This usually clears up by tonight or the next day.  Walk with the use of an assistive device or accompanied by an adult for the 24 hours.  You may use ice on the affected area for the first 24 hours.  Put ice in a Ziploc bag and cover with a towel and place against area 15 minutes on 15 minutes off.  You may switch to heat after 24 hours.

## 2019-05-06 ENCOUNTER — Telehealth: Payer: Self-pay

## 2019-05-06 NOTE — Telephone Encounter (Signed)
No answer- Left message to call if needed 

## 2019-05-08 ENCOUNTER — Ambulatory Visit: Payer: BC Managed Care – PPO | Admitting: Internal Medicine

## 2019-05-14 NOTE — Procedures (Signed)
[facility] [place], [place] [address]  Sleep Specialist: Allyne Gee, MD [place] Sleep Study Interpretation  Patient Name: Candace Clayton Patient MR [id number] DOB:1960/10/17  Date of Study: March 19, 2019  Indications for study: Hypersomnia snoring obstructive sleep apnea  BMI: 52.4 kg/m       Respiratory Data:  Total AHI: 29.8/h  Total Obstructive Apneas: 17  Total Central Apneas: 0  Total Mixed Apneas: 0  Total Hypopneas: 277  If the AHI is greater than 5 per hour patient qualifies for PAP evaluation  Oximetry Data:  Oxygen Desaturation Index: 45.4/h  Lowest Desaturation: 77%  Cardiac Data:  Minimum Heart Rate: 62  Maximum Heart Rate: 80   Impression / Diagnosis:  This apnea study is consistent with moderate to severe obstructive sleep apnea and severe oxygen desaturations.  Patient would benefit from a CPAP titration study clinical correlation is recommended.  GENERAL Recommendations:  1.  Consider Auto PAP with pressure ranges 5-20 cmH20 with download, or facility based PAP Titration Study  2.  Consider PAP interface mask fitted for patient comfort, Heated Humidification & PAP compliance monitoring (1 month, 3 months & 12 months after PAP initiation)  3. Consider treatment with mandibular advancement splint (MAS) or referral to an ENT surgeon for modification to the upper airway if the patient prefers an alternate therapy or the PAP trial is unsuccessful  4. Sleep hygiene measures should be discussed with the patient  5. Behavioral therapy such as weight reduction or smoking cessation as appropriate for the patient  6. Advise patient against the use of alcohol or sedatives in so much as these substances can worsen excessive daytime sleepiness and respiratory disturbances of sleep  7. Advise patient against participating in potentially dangerous activities while drowsy such as operating a motor vehicle,  heavy equipment or power tools as it can put them and others in danger  8. Advise patient of the long term consequences of OSA if left untreated, need for treatment and close follow up  9. Clinical follow up as deemed necessary     This Level III home sleep study was performed using the [employer], a 4 channel screening device subject to limitations. Depending on actual total sleep time, not measured in this study, the AHI (sum of apneas and hypopneas/hr of sleep) and therefore the severity of sleep apnea may be underestimated. As with any single night study, including Level 1 attended PSG, severity of sleep apnea may also be underestimated due to the lack of supine and/or REM sleep.  The interpretation associated with this report is based on normal values and degrees of severity in accordance with AASM parameters and/or estimated from multiple sources in the literature for adults ages 44-80+. These may not agree with the displayed values. The patient's treating physician should use the interpretation and recommendations in conjunction with the overall clinical evaluation and treatment of the patient.  Some of the terminology used in this scored [facility] report was developed several years ago and may not always be in accordance with current nomenclature. This in no way affects the accuracy of the data or the reliability of the interpretation and recommendations.

## 2019-05-15 ENCOUNTER — Telehealth: Payer: Self-pay

## 2019-05-15 NOTE — Telephone Encounter (Signed)
Called lmom informing patient of appointment. klh 

## 2019-05-19 ENCOUNTER — Ambulatory Visit: Payer: BC Managed Care – PPO | Admitting: Internal Medicine

## 2019-05-19 ENCOUNTER — Other Ambulatory Visit: Payer: Self-pay | Admitting: Internal Medicine

## 2019-05-29 ENCOUNTER — Telehealth: Payer: Self-pay

## 2019-05-29 NOTE — Telephone Encounter (Signed)
CONFIRMED 06-02-19 OV AS VIRTUAL.

## 2019-05-30 ENCOUNTER — Telehealth: Payer: Self-pay

## 2019-05-30 NOTE — Telephone Encounter (Signed)
Confirmed appointment with patient. klh °

## 2019-06-02 ENCOUNTER — Other Ambulatory Visit: Payer: Self-pay

## 2019-06-02 ENCOUNTER — Encounter: Payer: Self-pay | Admitting: Nurse Practitioner

## 2019-06-02 ENCOUNTER — Ambulatory Visit: Payer: BC Managed Care – PPO | Admitting: Nurse Practitioner

## 2019-06-02 VITALS — Ht 69.0 in

## 2019-06-02 DIAGNOSIS — F411 Generalized anxiety disorder: Secondary | ICD-10-CM

## 2019-06-02 DIAGNOSIS — I10 Essential (primary) hypertension: Secondary | ICD-10-CM | POA: Diagnosis not present

## 2019-06-02 DIAGNOSIS — N390 Urinary tract infection, site not specified: Secondary | ICD-10-CM | POA: Diagnosis not present

## 2019-06-02 DIAGNOSIS — N3289 Other specified disorders of bladder: Secondary | ICD-10-CM

## 2019-06-02 DIAGNOSIS — F331 Major depressive disorder, recurrent, moderate: Secondary | ICD-10-CM | POA: Diagnosis not present

## 2019-06-02 MED ORDER — ENALAPRIL MALEATE 20 MG PO TABS: 20.0000 mg | ORAL_TABLET | Freq: Two times a day (BID) | ORAL | 3 refills | Status: DC

## 2019-06-02 MED ORDER — PHENAZOPYRIDINE HCL 200 MG PO TABS: 200.0000 mg | ORAL_TABLET | Freq: Three times a day (TID) | ORAL | 0 refills | Status: DC | PRN

## 2019-06-02 MED ORDER — SULFAMETHOXAZOLE-TRIMETHOPRIM 800-160 MG PO TABS: 1.0000 | ORAL_TABLET | Freq: Two times a day (BID) | ORAL | 0 refills | Status: DC

## 2019-06-02 MED ORDER — BUSPIRONE HCL 15 MG PO TABS: 15.0000 mg | ORAL_TABLET | Freq: Three times a day (TID) | ORAL | 3 refills | Status: DC

## 2019-06-02 NOTE — Progress Notes (Signed)
[facility] [place], [place] [address]  Internal MEDICINE  Telephone Visit  Patient Name: Candace Clayton  [id number]  IC:[id number]  Date of Service: 06/02/2019  I connected with the patient at 9:40am by telephone and verified the patients identity using two identifiers.   I discussed the limitations, risks, security and privacy concerns of performing an evaluation and management service by telephone and the availability of in person appointments. I also discussed with the patient that there may be a patient responsible charge related to the service.  The patient expressed understanding and agrees to proceed.    Chief Complaint  Patient presents with  . Telephone Assessment  . Telephone Screen  . Hypertension  . Dysuria    urinary frequency, low back pain, urine is dark     The patient has been contacted via telephone for follow up visit due to concerns for spread of novel coronavirus. The patient is complaining of urinary frequency. States that when she uses the bathroom, she is unable to urinate, then feels like she has to use the bathroom again 15 minutes later. She states that she has nausea and has been having "dry heaves." she also has pain just above the tailbone. She states that urine is sticky and is dark in color. She states that urine will leak out if she does not go to the bathroom immediately when she feels the urge to urinate.       Current Medication: Outpatient Encounter Medications as of 06/02/2019  Medication Sig  . albuterol (VENTOLIN HFA) 108 (90 Base) MCG/ACT inhaler TAKE 2 PUFFS BY MOUTH EVERY 6 HOURS AS NEEDED FOR WHEEZE OR SHORTNESS OF BREATH  . budesonide-formoterol (SYMBICORT) 160-4.5 MCG/ACT inhaler Inhale 2 puffs into the lungs 2 (two) times daily.  . busPIRone (BUSPAR) 10 MG tablet PLEASE SEE ATTACHED FOR DETAILED DIRECTIONS  . busPIRone (BUSPAR) 15 MG tablet Take 1 tablet (15 mg total) by mouth 3 (three) times daily. Take one tab  po bid for one week, then increase to tid for anxiety (Patient taking differently: Take 20 mg by mouth 3 (three) times daily. Take one tab po bid for one week, then increase to tid for anxiety)  . cetirizine (ZYRTEC) 10 MG tablet TAKE 1 TABLET(S) BY MOUTH DAILY FOR ALLERGIES  . desvenlafaxine (PRISTIQ) 100 MG 24 hr tablet Take 1 tablet (100 mg total) by mouth daily.  . enalapril (VASOTEC) 20 MG tablet Take 1 tablet (20 mg total) by mouth 2 (two) times daily.  . hydrochlorothiazide (HYDRODIURIL) 25 MG tablet Take 1 tablet (25 mg total) by mouth daily.  . hydrOXYzine (ATARAX/VISTARIL) 10 MG tablet Take 1 tablet (10 mg total) by mouth 3 (three) times daily as needed.  . Multiple Vitamins-Calcium (ONE-A-DAY WOMENS PO) Take 1 tablet by mouth daily.  . NON FORMULARY cpap device  . OVER THE COUNTER MEDICATION 1 tablet daily as needed. Takes generic stool softener for constipation d/t oxycodone.  Marland Kitchen oxyCODONE-acetaminophen (PERCOCET) 5-325 MG tablet Take 1 tablet by mouth every 12 (twelve) hours as needed for severe pain. Must last 30 days.  Derrill Memo ON 07/01/2019] oxyCODONE-acetaminophen (PERCOCET) 5-325 MG tablet Take 1 tablet by mouth every 12 (twelve) hours as needed for severe pain. Must last 30 days.  . pregabalin (LYRICA) 50 MG capsule 50 mg during day, 100 mg qhs  . [DISCONTINUED] enalapril (VASOTEC) 20 MG tablet Take 1 tablet (20 mg total) by mouth 2 (two) times daily.  . phenazopyridine (PYRIDIUM) 200 MG tablet  Take 1 tablet (200 mg total) by mouth 3 (three) times daily as needed for pain.  Marland Kitchen sulfamethoxazole-trimethoprim (BACTRIM DS) 800-160 MG tablet Take 1 tablet by mouth 2 (two) times daily.   No facility-administered encounter medications on file as of 06/02/2019.     Surgical History: Past Surgical History:  Procedure Laterality Date  . CESAREAN SECTION  1992  . COLONOSCOPY     polyps removed first procedure. 2nd time all was clear  . COLONOSCOPY WITH PROPOFOL N/A 03/14/2019   Procedure:  COLONOSCOPY WITH PROPOFOL;  Surgeon: Lin Landsman, MD;  Location: [facility] ENDOSCOPY;  Service: Gastroenterology;  Laterality: N/A;  . FINGER SURGERY Left 2011   left finger cut off x 2.(only up to last digit)  . HEMATOMA EVACUATION Left 1999   upper part of foot was injured d/t 500lb weight landing on her foot.   Marland Kitchen LAPAROSCOPIC SALPINGO OOPHERECTOMY Left 04/02/2018   Procedure: LAPAROSCOPIC SALPINGO OOPHORECTOMY;  Surgeon: Malachy Mood, MD;  Location: [facility] ORS;  Service: Gynecology;  Laterality: Left;    Medical History: Past Medical History:  Diagnosis Date  . Anginal pain (HCC)    tightness related to anxiety  . Anxiety   . Depression   . GERD (gastroesophageal reflux disease)    throws up easily but not diagnosed with reflux  . History of C-section 49  . Hypertension   . Sleep apnea    uses cpap    Family History: Family History  Problem Relation Age of Onset  . Breast cancer Mother 40       x 3 times  . Colon cancer Mother   . Brain cancer Mother   . Cancer - Colon Mother   . Cancer - Other Mother   . Heart Problems Father   . Colon cancer Brother   . Heart Problems Brother     Social History   Socioeconomic History  . Marital status: Divorced    Spouse name: Not on file  . Number of children: 1  . Years of education: Not on file  . Highest education level: Not on file  Occupational History  . Occupation: works in Secretary/administrator  Social Needs  . Financial resource strain: Not on file  . Food insecurity    Worry: Not on file    Inability: Not on file  . Transportation needs    Medical: Not on file    Non-medical: Not on file  Tobacco Use  . Smoking status: Never Smoker  . Smokeless tobacco: Never Used  Substance and Sexual Activity  . Alcohol use: No    Frequency: Never  . Drug use: No  . Sexual activity: Not Currently    Birth control/protection: Post-menopausal  Lifestyle  . Physical activity    Days per week: Not on file    Minutes  per session: Not on file  . Stress: Not on file  Relationships  . Social Herbalist on phone: Not on file    Gets together: Not on file    Attends religious service: Not on file    Active member of club or organization: Not on file    Attends meetings of clubs or organizations: Not on file    Relationship status: Not on file  . Intimate partner violence    Fear of current or ex partner: Not on file    Emotionally abused: Not on file    Physically abused: Not on file    Forced sexual activity: Not on  file  Other Topics Concern  . Not on file  Social History Narrative   Son has schizophrenia and autism but is fully capable of helping mother after surgery      Review of Systems  Constitutional: Positive for fatigue. Negative for chills and unexpected weight change.  HENT: Negative for congestion, postnasal drip, rhinorrhea, sneezing and sore throat.   Respiratory: Negative for cough, chest tightness and shortness of breath.   Cardiovascular: Negative for chest pain and palpitations.  Gastrointestinal: Positive for nausea and vomiting. Negative for abdominal pain, constipation and diarrhea.  Genitourinary: Positive for dysuria, flank pain, frequency and urgency.  Musculoskeletal: Positive for back pain. Negative for arthralgias, joint swelling and neck pain.  Skin: Negative for rash.  Allergic/Immunologic: Negative for environmental allergies.  Neurological: Negative for dizziness, tremors, numbness and headaches.  Hematological: Negative for adenopathy. Does not bruise/bleed easily.  Psychiatric/Behavioral: Positive for dysphoric mood. Negative for behavioral problems (Depression), sleep disturbance and suicidal ideas. The patient is nervous/anxious.     Today's Vitals   06/02/19 0922  Height: 5\' 9"  (1.753 m)   Body mass index is 51.39 kg/m.  Observation/Objective:   The patient is alert and oriented. She is pleasant and answers all questions appropriately.  Breathing is non-labored. She is in no acute distress at this time. She sounds as though she is in mild to moderate discomfort.    Assessment/Plan: 1. Essential hypertension Blood pressure stable per patient. Continue enalapril and HCTZ as prescribed  - enalapril (VASOTEC) 20 MG tablet; Take 1 tablet (20 mg total) by mouth 2 (two) times daily.  Dispense: 90 tablet; Refill: 3  2. Urinary tract infection without hematuria, site unspecified Start Bactrim DS bid for 10 days. Rest and increase fluids.  - sulfamethoxazole-trimethoprim (BACTRIM DS) 800-160 MG tablet; Take 1 tablet by mouth 2 (two) times daily.  Dispense: 20 tablet; Refill: 0  3. Bladder spasm Pyridium 200mg  may be taken up to three times daily if needed for bladder pain/spasms.  - phenazopyridine (PYRIDIUM) 200 MG tablet; Take 1 tablet (200 mg total) by mouth 3 (three) times daily as needed for pain.  Dispense: 10 tablet; Refill: 0  4. Major depressive disorder, recurrent episode, moderate (HCC) Stable. Continue pristiq as prescribed   5. GAD (generalized anxiety disorder) May use buspirone as needed and as prescribed   General Counseling: Kaneisha verbalizes understanding of the findings of today's phone visit and agrees with plan of treatment. I have discussed any further diagnostic evaluation that may be needed or ordered today. We also reviewed her medications today. she has been encouraged to call the office with any questions or concerns that should arise related to todays visit.  This patient was seen by [place] with Dr Lavera Guise as a part of collaborative care agreement  Meds ordered this encounter  Medications  . phenazopyridine (PYRIDIUM) 200 MG tablet    Sig: Take 1 tablet (200 mg total) by mouth 3 (three) times daily as needed for pain.    Dispense:  10 tablet    Refill:  0    Order Specific Question:   Supervising Provider    Answer:   Lavera Guise [id number]  .  sulfamethoxazole-trimethoprim (BACTRIM DS) 800-160 MG tablet    Sig: Take 1 tablet by mouth 2 (two) times daily.    Dispense:  20 tablet    Refill:  0    Order Specific Question:   Supervising Provider    Answer:   Humphrey Rolls,  [place] M [[id number]]  . enalapril (VASOTEC) 20 MG tablet    Sig: Take 1 tablet (20 mg total) by mouth 2 (two) times daily.    Dispense:  90 tablet    Refill:  3    Order Specific Question:   Supervising Provider    Answer:   Lavera Guise [id number]    Time spent: 43 Minutes    Dr Lavera Guise Internal medicine

## 2019-06-03 ENCOUNTER — Ambulatory Visit (INDEPENDENT_AMBULATORY_CARE_PROVIDER_SITE_OTHER): Payer: BC Managed Care – PPO | Admitting: Internal Medicine

## 2019-06-03 DIAGNOSIS — G4733 Obstructive sleep apnea (adult) (pediatric): Secondary | ICD-10-CM | POA: Diagnosis not present

## 2019-06-04 ENCOUNTER — Other Ambulatory Visit: Payer: Self-pay

## 2019-06-08 NOTE — Procedures (Signed)
[facility] [address] [place], [place] [address]  Patient Name: Candace Clayton DOB: 1960/12/31   SLEEP STUDY INTERPRETATION  DATE OF SERVICE: June 03, 2019   SLEEP STUDY HISTORY: This patient is referred to the sleep lab for a baseline Polysomnography. Pertinent history includes a history of diagnosis of excessive daytime somnolence and snoring.  PROCEDURE: This overnight polysomnogram was performed using the Alice 5 acquisition system using the standard diagnostic protocol as outlined by the AASM. This includes 6 channels of EEG, 2 channelscannels of EOG, chin EMG, bilateral anterior tibialis EMG, nasal/oral thermister, PTAF, chest and abdominal wall movements, ECG and pulse oximetry. Apneas and Hypopneas were scored per AASM definition.  SLEEP ARCHITECHTURE: This is a baseline polysomnograph  study. The total recording time was 450.2 minutes and the patients total sleep time is noted to be 307.5 minutes. Sleep onset latency was 22.0 minutes and is prolonged.  Stage R sleep onset latency was 159.0 minutes. Sleep maintenance efficiency was 68.9% and is decreased.  Sleep staging expressed as a percentage of total sleep time demonstrated 29.1% N1, 52.2% N2 and 6.8% N3  sleep. Stage R represents 11.9% of total sleep time. This is decreased.  There were a total of 20 arousals  for an overall arousal index of 3.9 per hour of sleep. PLMS arousal were not noted. Arousals without respiratory events are  noted. This can contribute to sleep architechture disruption.  RESPIRATORY MONITORING:   Patient exhibits some evidence of sleep disorderd breathing characterized by 0 central apneas, 2 obstructive apneas and 0 mixed apneas. There were 4 obstructive hypopneas and 0 RERAs. Most of the apneas/hypopneas were of obstructive variety. The total apnea hypopnea index (apneas and hypopneas per hour of sleep) is 1.2 respiratory events per hour and is within normal limits.  Respiratory  monitoring demonstrated mild snoring through the night. There are a total of 183 snoring episodes representing 8.0% of sleep.   Baseline oxygen saturation during wakefulness was 96% and during NREM sleep averaged 96% through the night. Arterial saturation during REM sleep was 96% through the night. There was no significant  oxygen desaturation with the respiratory events. Arterial oxygen desaturation occurred of at least 4% was noted with a low saturation of 92%. The study was performed off oxygen.  CARDIAC MONITORING:   Average heart rate is 56 during sleep with a high of 90 beats per minute. Malignant arrhythmias were not noted.  CPAP titration: Patient was started on CPAP titration trial based on the sleep lab protocol.  She was started on a CPAP of 5 CWP and was increased gradually up to a maximum of 11 CWP.  The patient did fairly well on all pressures it appears that a pressure pressure of 9 CWP showed significant improvement.  At this pressure the patient slept for 80.4 minutes and did exhibit stage R sleep.  The lowest oxygen saturation was 92%.  The patient had a AHI of only 0.7 events per hour based on this pressure.  There is slight improvement on a pressure of 10 with no respiratory events and lowest saturation of 94% noted  IMPRESSIONS:  --This overnight polysomnogram demonstrates insignificant sleep apnea with an overall AHI 1.2 per hour on CPAP. --The overall AHI was no worse  during Stage R. --There were associated insignificant arterial oxygen desaturations noted with a nadir of 94% --There were no significant PLMS noted in this study. --There is mild snoring noted throughout the study.    RECOMMENDATIONS:  --CPAP titration  study is adequate to control this patient's obstructive sleep apnea.  The optimal pressure in this case is 10 CWP.  Also it should be noted the patient did do well on a pressure of 9 CWP also.. --Nasal decongestants and antihistamines may be of help for  increased upper airways resistance when present. --Weight loss through dietary and lifestyle modification is recommended in the presence of obesity. --A search for and treatment of any underlying cardiopulmonary disease is      recommended in the presence of oxygen desaturations. --Alternative treatment options if the patient is not willing to use CPAP include oral   appliances as well as surgical intervention which may help in the appropriate patient. --Clinical correlation is recommended. Please feel free to call the office for any further  questions or assistance in the care of this patient.     Allyne Gee, MD [facility] Pulmonary Critical Care Medicine Sleep medicine

## 2019-06-12 ENCOUNTER — Telehealth: Payer: Self-pay

## 2019-06-12 NOTE — Telephone Encounter (Signed)
Confirmed appointment with patient. klh °

## 2019-06-16 ENCOUNTER — Encounter: Payer: Self-pay | Admitting: Internal Medicine

## 2019-06-16 ENCOUNTER — Ambulatory Visit: Payer: BC Managed Care – PPO | Admitting: Internal Medicine

## 2019-06-16 ENCOUNTER — Other Ambulatory Visit: Payer: Self-pay

## 2019-06-16 VITALS — BP 149/80 | HR 98 | Resp 16 | Ht 69.0 in | Wt 348.0 lb

## 2019-06-16 DIAGNOSIS — G4733 Obstructive sleep apnea (adult) (pediatric): Secondary | ICD-10-CM | POA: Diagnosis not present

## 2019-06-16 DIAGNOSIS — I10 Essential (primary) hypertension: Secondary | ICD-10-CM | POA: Diagnosis not present

## 2019-06-16 NOTE — Progress Notes (Signed)
[facility] [place], [place] [address]  Pulmonary Sleep Medicine   Office Visit Note  Patient Name: Candace Clayton DOB: [birth year] MRN [id number]  Date of Service: 06/16/2019  Complaints/HPI: Pt is here to follow up on cpap titration study.  The titration study shows an optimal pressure of 10cm/h2o. She is using her current machine, however has some difficulty staying asleep and waking up choking.  Her machine is over 58 years old, and at this time a new one will be ordered for her.     Review of Systems  Constitutional: Negative for chills, fatigue and unexpected weight change.  HENT: Negative for congestion, rhinorrhea, sneezing and sore throat.   Eyes: Negative for photophobia, pain and redness.  Respiratory: Negative for cough, chest tightness and shortness of breath.   Cardiovascular: Negative for chest pain and palpitations.  Gastrointestinal: Negative for abdominal pain, constipation, diarrhea, nausea and vomiting.  Genitourinary: Negative for dysuria and frequency.  Musculoskeletal: Negative for arthralgias, back pain, joint swelling and neck pain.  Skin: Negative for rash.  Neurological: Negative for tremors and numbness.  Endo/Heme/Allergies: Negative for adenopathy. Does not bruise/bleed easily.  Psychiatric/Behavioral: Negative for behavioral problems and sleep disturbance. The patient is not nervous/anxious.     General: (-) fever, (-) chills, (-) night sweats, (-) weakness Skin: (-) rashes, (-) itching,. Eyes: (-) visual changes, (-) redness, (-) itching. Nose and Sinuses: (-) nasal stuffiness or itchiness, (-) postnasal drip, (-) nosebleeds, (-) sinus trouble. Mouth and Throat: (-) sore throat, (-) hoarseness. Neck: (-) swollen glands, (-) enlarged thyroid, (-) neck pain. Respiratory: - cough, (-) bloody sputum, - shortness of breath, - wheezing. Cardiovascular: - ankle swelling, (-) chest pain. Lymphatic: (-) lymph node  enlargement. Neurologic: (-) numbness, (-) tingling. Psychiatric: (-) anxiety, (-) depression   Current Medication: Outpatient Encounter Medications as of 06/16/2019  Medication Sig  . albuterol (VENTOLIN HFA) 108 (90 Base) MCG/ACT inhaler TAKE 2 PUFFS BY MOUTH EVERY 6 HOURS AS NEEDED FOR WHEEZE OR SHORTNESS OF BREATH  . budesonide-formoterol (SYMBICORT) 160-4.5 MCG/ACT inhaler Inhale 2 puffs into the lungs 2 (two) times daily.  . busPIRone (BUSPAR) 10 MG tablet PLEASE SEE ATTACHED FOR DETAILED DIRECTIONS  . busPIRone (BUSPAR) 15 MG tablet Take 1 tablet (15 mg total) by mouth 3 (three) times daily. Take one tab po bid for one week, then increase to tid for anxiety  . cetirizine (ZYRTEC) 10 MG tablet TAKE 1 TABLET(S) BY MOUTH DAILY FOR ALLERGIES  . desvenlafaxine (PRISTIQ) 100 MG 24 hr tablet Take 1 tablet (100 mg total) by mouth daily.  . enalapril (VASOTEC) 20 MG tablet Take 1 tablet (20 mg total) by mouth 2 (two) times daily.  . hydrochlorothiazide (HYDRODIURIL) 25 MG tablet Take 1 tablet (25 mg total) by mouth daily.  . hydrOXYzine (ATARAX/VISTARIL) 10 MG tablet Take 1 tablet (10 mg total) by mouth 3 (three) times daily as needed.  . Multiple Vitamins-Calcium (ONE-A-DAY WOMENS PO) Take 1 tablet by mouth daily.  . NON FORMULARY cpap device  . OVER THE COUNTER MEDICATION 1 tablet daily as needed. Takes generic stool softener for constipation d/t oxycodone.  Marland Kitchen oxyCODONE-acetaminophen (PERCOCET) 5-325 MG tablet Take 1 tablet by mouth every 12 (twelve) hours as needed for severe pain. Must last 30 days.  Derrill Memo ON 07/01/2019] oxyCODONE-acetaminophen (PERCOCET) 5-325 MG tablet Take 1 tablet by mouth every 12 (twelve) hours as needed for severe pain. Must last 30 days.  . phenazopyridine (PYRIDIUM) 200 MG tablet Take 1  tablet (200 mg total) by mouth 3 (three) times daily as needed for pain.  . pregabalin (LYRICA) 50 MG capsule 50 mg during day, 100 mg qhs  . sulfamethoxazole-trimethoprim (BACTRIM  DS) 800-160 MG tablet Take 1 tablet by mouth 2 (two) times daily.   No facility-administered encounter medications on file as of 06/16/2019.    Surgical History: Past Surgical History:  Procedure Laterality Date  . CESAREAN SECTION  1992  . COLONOSCOPY     polyps removed first procedure. 2nd time all was clear  . COLONOSCOPY WITH PROPOFOL N/A 03/14/2019   Procedure: COLONOSCOPY WITH PROPOFOL;  Surgeon: Lin Landsman, MD;  Location: [facility] ENDOSCOPY;  Service: Gastroenterology;  Laterality: N/A;  . FINGER SURGERY Left 2011   left finger cut off x 2.(only up to last digit)  . HEMATOMA EVACUATION Left 1999   upper part of foot was injured d/t 500lb weight landing on her foot.   Marland Kitchen LAPAROSCOPIC SALPINGO OOPHERECTOMY Left 04/02/2018   Procedure: LAPAROSCOPIC SALPINGO OOPHORECTOMY;  Surgeon: Malachy Mood, MD;  Location: [facility] ORS;  Service: Gynecology;  Laterality: Left;    Medical History: Past Medical History:  Diagnosis Date  . Anginal pain (HCC)    tightness related to anxiety  . Anxiety   . Depression   . GERD (gastroesophageal reflux disease)    throws up easily but not diagnosed with reflux  . History of C-section 45  . Hypertension   . Sleep apnea    uses cpap    Family History: Family History  Problem Relation Age of Onset  . Breast cancer Mother 40       x 3 times  . Colon cancer Mother   . Brain cancer Mother   . Cancer - Colon Mother   . Cancer - Other Mother   . Heart Problems Father   . Colon cancer Brother   . Heart Problems Brother     Social History: Social History   Socioeconomic History  . Marital status: Divorced    Spouse name: Not on file  . Number of children: 1  . Years of education: Not on file  . Highest education level: Not on file  Occupational History  . Occupation: works in Secretary/administrator  Tobacco Use  . Smoking status: Never Smoker  . Smokeless tobacco: Never Used  Substance and Sexual Activity  . Alcohol use: No  .  Drug use: No  . Sexual activity: Not Currently    Birth control/protection: Post-menopausal  Other Topics Concern  . Not on file  Social History Narrative   Son has schizophrenia and autism but is fully capable of helping mother after surgery   Social Determinants of Health   Financial Resource Strain:   . Difficulty of Paying Living Expenses: Not on file  Food Insecurity:   . Worried About Charity fundraiser in the Last Year: Not on file  . Ran Out of Food in the Last Year: Not on file  Transportation Needs:   . Lack of Transportation (Medical): Not on file  . Lack of Transportation (Non-Medical): Not on file  Physical Activity:   . Days of Exercise per Week: Not on file  . Minutes of Exercise per Session: Not on file  Stress:   . Feeling of Stress : Not on file  Social Connections:   . Frequency of Communication with Friends and Family: Not on file  . Frequency of Social Gatherings with Friends and Family: Not on file  . Attends Religious  Services: Not on file  . Active Member of Clubs or Organizations: Not on file  . Attends Archivist Meetings: Not on file  . Marital Status: Not on file  Intimate Partner Violence:   . Fear of Current or Ex-Partner: Not on file  . Emotionally Abused: Not on file  . Physically Abused: Not on file  . Sexually Abused: Not on file    Vital Signs: Blood pressure (!) 149/80, pulse 98, resp. rate 16, height 5\' 9"  (1.753 m), weight (!) 348 lb (157.9 kg), SpO2 96 %.  Examination: General Appearance: The patient is well-developed, well-nourished, and in no distress. Skin: Gross inspection of skin unremarkable. Head: normocephalic, no gross deformities. Eyes: no gross deformities noted. ENT: ears appear grossly normal no exudates. Neck: Supple. No thyromegaly. No LAD. Respiratory: Clear bilaterally. Cardiovascular: Normal S1 and S2 without murmur or rub. Extremities: No cyanosis. pulses are equal. Neurologic: Alert and oriented.  No involuntary movements.  LABS: No results found for this or any previous visit (from the past 2160 hour(s)).  Radiology: Fluoro (C-Arm) (<60 min) (No Report)  Result Date: 05/05/2019 Fluoro was used, but no Radiologist interpretation will be provided. Please refer to "NOTES" tab for provider progress note.   No results found.  No results found.    Assessment and Plan: Patient Active Problem List   Diagnosis Date Noted  . Bladder spasm 06/02/2019  . Family history of colon cancer in mother   . Right lower quadrant pain 03/12/2019  . History of ovarian cyst 03/12/2019  . Chronic pain syndrome 03/05/2019  . Seasonal allergic rhinitis due to pollen 08/26/2018  . Lumbar facet arthropathy (R>L) 08/22/2018  . Lumbar spondylosis 08/22/2018  . Left ovarian cyst 03/03/2018  . Lumbar degenerative disc disease 12/23/2017  . Encounter for screening colonoscopy 10/24/2017  . Low back pain with sciatica 07/03/2017  . Allergic rhinitis, unspecified 07/03/2017  . Pain in unspecified knee 07/03/2017  . Obstructive sleep apnea, adult 07/03/2017  . Sedative, hypnotic, or anxiolytic use, unspecified, uncomplicated [id number]  . Hypersomnia 06/27/2017  . Major depressive disorder, recurrent episode, moderate ([place]) 06/27/2017  . Essential hypertension 06/27/2017  . Morbid obesity ([place]) 06/27/2017  . GAD (generalized anxiety disorder) 06/27/2017  . Urinary tract infection 06/27/2017    1. Obstructive sleep apnea Order new cpap, follow up with patient after delivery.  - For home use only DME continuous positive airway pressure (CPAP)  2. Essential hypertension Slightly elevated, continue present management.   3. Morbid obesity ([place]) Obesity Counseling: Risk Assessment: An assessment of behavioral risk factors was made today and includes lack of exercise sedentary lifestyle, lack of portion control and poor dietary habits.  Risk Modification Advice: She was counseled on portion control  guidelines. Restricting daily caloric intake to 1600. The detrimental long term effects of obesity on her health and ongoing poor compliance was also discussed with the patient.  General Counseling: I have discussed the findings of the evaluation and examination with Khia.  I have also discussed any further diagnostic evaluation thatmay be needed or ordered today. Reonna verbalizes understanding of the findings of todays visit. We also reviewed her medications today and discussed drug interactions and side effects including but not limited excessive drowsiness and altered mental states. We also discussed that there is always a risk not just to her but also people around her. she has been encouraged to call the office with any questions or concerns that should arise related to todays visit.  Orders Placed This  Encounter  Procedures  . Flu Vaccine MDCK QUAD PF     Time spent: 25 This patient was seen by Orson Gear AGNP-C in Collaboration with Dr. Devona Konig as a part of collaborative care agreement.

## 2019-06-17 ENCOUNTER — Other Ambulatory Visit: Payer: Self-pay

## 2019-06-17 DIAGNOSIS — F331 Major depressive disorder, recurrent, moderate: Secondary | ICD-10-CM

## 2019-06-17 MED ORDER — DESVENLAFAXINE SUCCINATE ER 100 MG PO TB24: 100.0000 mg | ORAL_TABLET | Freq: Every day | ORAL | 1 refills | Status: DC

## 2019-06-25 ENCOUNTER — Telehealth: Payer: Self-pay

## 2019-06-25 NOTE — Telephone Encounter (Signed)
Gave [facility] CPAP set up in clinic. Candace Clayton

## 2019-07-07 ENCOUNTER — Telehealth: Payer: Self-pay

## 2019-07-07 NOTE — Telephone Encounter (Signed)
Tried calling pt to cancel sleep clinic appt, per [place] patient, this patient will not be due for new cpap machine until 03/2020. Beth

## 2019-07-09 ENCOUNTER — Other Ambulatory Visit: Payer: Self-pay | Admitting: Internal Medicine

## 2019-07-09 ENCOUNTER — Other Ambulatory Visit: Payer: Self-pay | Admitting: Adult Health

## 2019-07-09 ENCOUNTER — Other Ambulatory Visit: Payer: Self-pay | Admitting: Student in an Organized Health Care Education/Training Program

## 2019-07-09 ENCOUNTER — Ambulatory Visit: Payer: BC Managed Care – PPO

## 2019-07-09 DIAGNOSIS — G894 Chronic pain syndrome: Secondary | ICD-10-CM

## 2019-07-30 ENCOUNTER — Other Ambulatory Visit: Payer: Self-pay | Admitting: Internal Medicine

## 2019-07-30 DIAGNOSIS — J4520 Mild intermittent asthma, uncomplicated: Secondary | ICD-10-CM

## 2019-07-31 ENCOUNTER — Encounter: Payer: BC Managed Care – PPO | Admitting: Student in an Organized Health Care Education/Training Program

## 2019-08-05 ENCOUNTER — Ambulatory Visit: Payer: BC Managed Care – PPO | Admitting: Student in an Organized Health Care Education/Training Program

## 2019-08-06 ENCOUNTER — Encounter: Payer: Self-pay | Admitting: Student in an Organized Health Care Education/Training Program

## 2019-08-07 ENCOUNTER — Ambulatory Visit
Payer: BC Managed Care – PPO | Attending: Student in an Organized Health Care Education/Training Program | Admitting: Student in an Organized Health Care Education/Training Program

## 2019-08-07 ENCOUNTER — Encounter: Payer: Self-pay | Admitting: Student in an Organized Health Care Education/Training Program

## 2019-08-07 ENCOUNTER — Other Ambulatory Visit: Payer: Self-pay

## 2019-08-07 DIAGNOSIS — M47816 Spondylosis without myelopathy or radiculopathy, lumbar region: Secondary | ICD-10-CM

## 2019-08-07 DIAGNOSIS — G894 Chronic pain syndrome: Secondary | ICD-10-CM | POA: Diagnosis not present

## 2019-08-07 MED ORDER — OXYCODONE-ACETAMINOPHEN 5-325 MG PO TABS: 1.0000 | ORAL_TABLET | Freq: Two times a day (BID) | ORAL | 0 refills | Status: DC | PRN

## 2019-08-07 MED ORDER — PREGABALIN 50 MG PO CAPS: ORAL_CAPSULE | ORAL | 2 refills | Status: DC

## 2019-08-07 NOTE — Progress Notes (Signed)
Patient: Candace Clayton  Service Category: E/M  Provider: Gillis Santa, MD  DOB: January 07, 1961  DOS: 08/07/2019  Location: Office  MRN: [id number]  Setting: Ambulatory outpatient  Referring Provider: Ronnell Freshwater, NP  Type: Established Patient  Specialty: Interventional Pain Management  PCP: Candace Freshwater, NP  Location: Home  Delivery: TeleHealth     Virtual Encounter - Pain Management PROVIDER NOTE: Information contained herein reflects review and annotations entered in association with encounter. Interpretation of such information and data should be left to medically-trained personnel. Information provided to patient can be located elsewhere in the medical record under "Patient Instructions". Document created using STT-dictation technology, any transcriptional errors that may result from process are unintentional.    Contact & Pharmacy Preferred: [phone] Home: [phone] (home) Mobile: [phone] (mobile) E-mail: No e-mail address on record  [id number] [address]: [phone]: [phone] [place] [address] [address]Phone: [phone]: [phone]  Pre-screening  [name] offered "in-person" vs "virtual" encounter. She indicated preferring virtual for this encounter.   Reason COVID-19*  Social distancing based on CDC and AMA recommendations.   I contacted Candace Poissonon 08/07/2019 via telephone.      I clearly identified myself as BGillis Santa MD. I verified that I was speaking with the correct person using two identifiers (Name: Candace Clayton and date of birth: [birth year].  This visit was completed via telephone due to the restrictions of the COVID-19 pandemic. All issues as above were discussed and addressed but no physical exam was performed. If it was felt that the  patient should be evaluated in the office, they were directed there. The patient verbally consented to this visit. Patient was unable to complete an audio/visual visit due to Technical difficulties and/or Lack of internet. Due to the catastrophic nature of the COVID-19 pandemic, this visit was done through audio contact only.  Location of the patient: home address (see Epic for details)  Location of the provider: office  Consent I sought verbal advanced consent from Candace Poissonfor virtual visit interactions. I informed Candace Clayton of possible security and privacy concerns, risks, and limitations associated with providing "not-in-person" medical evaluation and management services. I also informed Candace Clayton of the availability of "in-person" appointments. Finally, I informed her that there would be a charge for the virtual visit and that she could be  personally, fully or partially, financially responsible for it. [name] understanding and agreed to proceed.   Historic Elements   Ms. PCHELAN HERINGERis a 59y.o. year old, female patient evaluated today after her last contact with our practice on 07/09/2019. Candace Clayton has a past medical history of Anginal pain ([place], Anxiety, Depression, GERD (gastroesophageal reflux disease), History of C-section (1992), Hypertension, and Sleep apnea. She also  has a past surgical history that includes Cesarean section (1992); Finger surgery (Left, 2011); Hematoma evacuation (Left, 1999); Colonoscopy; Laparoscopic salpingo oophorectomy (Left, 04/02/2018); and Colonoscopy with propofol (N/A, 03/14/2019). Ms. WSlingerlandhas a current medication list which includes the following prescription(s): albuterol, buspirone, cetirizine, desvenlafaxine, enalapril, hydrochlorothiazide, hydroxyzine, multiple vitamins-minerals, NON FORMULARY, OVER THE COUNTER MEDICATION, pregabalin, sulfamethoxazole-trimethoprim, symbicort, buspirone, oxycodone-acetaminophen, [START ON  09/06/2019] oxycodone-acetaminophen, and [START ON 10/06/2019] oxycodone-acetaminophen. She  reports that she has never smoked. She has never used smokeless tobacco.  She reports that she does not drink alcohol or use drugs. Candace Clayton is allergic to gabapentin.   HPI  Today, she is being contacted for medication management.   No change in medical history since last visit.  Patient's pain is at baseline.  Patient continues multimodal pain regimen as prescribed.  States that it provides pain relief and improvement in functional status.   Increased Lyrica to 100 mg qhs which is helping her with her pain and also helping her sleep.  She is able to work and function better as well.  She continues 50 mg during the day.  She states that she is currently out of this medication.  She also continues oxycodone 5 mg twice daily as needed but usually takes it on days that she works.  She is status post right L3, L4, L5, S1 radiofrequency ablation on 05/05/2019 and she states that it has helped reduce her pain along her right lower back region and in her right hip region.  She would like to get her left side done.  She has had 2+ diagnostic lumbar facet medial branch nerve blocks which provided 100% pain relief for approximately 2 weeks performed on 04/21/2019 and 01/08/2019 respectively.  Pharmacotherapy Assessment  Analgesic: 07/04/2019  1   05/01/2019  Oxycodone-Acetaminophen 5-325  60.00  30 Bi Lat   [id number]   Nor (0921)   0  15.00 MME  Comm Ins   [employer]    Monitoring: [place] PMP: PDMP reviewed during this encounter.       Pharmacotherapy: No side-effects or adverse reactions reported. Compliance: No problems identified. Effectiveness: Clinically acceptable. Plan: Refer to "POC".  UDS:  Summary  Date Value Ref Range Status  07/02/2018 FINAL  Final    Comment:    ==================================================================== TOXASSURE COMP DRUG  ANALYSIS,UR ==================================================================== Test                             Result       Flag       Units Drug Present and Declared for Prescription Verification   Alprazolam                     86           EXPECTED   ng/mg creat   Alpha-hydroxyalprazolam        72           EXPECTED   ng/mg creat    Source of alprazolam is a scheduled prescription medication.    Alpha-hydroxyalprazolam is an expected metabolite of alprazolam.   Desmethylvenlafaxine           PRESENT      EXPECTED    Desmethylvenlafaxine may be present due to administration of    desvenlafaxine; it is also an expected metabolite of venlafaxine. Drug Present not Declared for Prescription Verification   Salicylate                     PRESENT      UNEXPECTED   Naproxen                       PRESENT      UNEXPECTED Drug Absent but Declared for Prescription Verification   Ephedrine/Pseudoephedrine      Not Detected UNEXPECTED   Gabapentin  Not Detected UNEXPECTED   Acetaminophen                  Not Detected UNEXPECTED    Acetaminophen, as indicated in the declared medication list, is    not always detected even when used as directed.   Diclofenac                     Not Detected UNEXPECTED    Diclofenac, as indicated in the declared medication list, is not    always detected even when used as directed.   Doxylamine                     Not Detected UNEXPECTED   Dextromethorphan               Not Detected UNEXPECTED ==================================================================== Test                      Result    Flag   Units      Ref Range   Creatinine              71               mg/dL      >=20 ==================================================================== Declared Medications:  The flagging and interpretation on this report are based on the  following declared medications.  Unexpected results may arise from  inaccuracies in the declared  medications.  **Note: The testing scope of this panel includes these medications:  Alprazolam  Desvenlafaxine  Dextromethorphan  Doxylamine  Gabapentin  Pseudoephedrine  **Note: The testing scope of this panel does not include small to  moderate amounts of these reported medications:  Acetaminophen  Diclofenac  **Note: The testing scope of this panel does not include following  reported medications:  Acyclovir  Calcium  Cetirizine  Docusate (Stool Softener)  Enalapril  Hydrochlorothiazide  Multivitamin ==================================================================== For clinical consultation, please call [phone]. ====================================================================    Laboratory Chemistry Profile   Renal Lab Results  Component Value Date   BUN 12 03/06/2019   CREATININE 0.80 03/06/2019   BCR 15 03/06/2019   GFRAA 94 03/06/2019   GFRNONAA 82 03/06/2019    Hepatic Lab Results  Component Value Date   AST 26 03/06/2019   ALT 30 03/06/2019   ALBUMIN 3.9 03/06/2019   ALKPHOS 92 03/06/2019    Electrolytes Lab Results  Component Value Date   NA 139 03/06/2019   K 4.9 03/06/2019   CL 105 03/06/2019   CALCIUM 9.1 03/06/2019    Bone Lab Results  Component Value Date   VD25OH 17.2 (L) 03/06/2019    Coagulation Lab Results  Component Value Date   PLT 266 03/06/2019    Cardiovascular Lab Results  Component Value Date   HGB 11.6 03/06/2019   HCT 36.2 03/06/2019    Inflammation (CRP: Acute Phase) (ESR: Chronic Phase) No results found for: CRP, ESRSEDRATE, LATICACIDVEN    Note: Above Lab results reviewed.  Assessment  The primary encounter diagnosis was Lumbar facet arthropathy (R>L). Diagnoses of Lumbar spondylosis and Chronic pain syndrome were also pertinent to this visit.  Plan of Care  I have discontinued Candace Clayton's phenazopyridine. I am also having her start on oxyCODONE-acetaminophen, oxyCODONE-acetaminophen, and  oxyCODONE-acetaminophen. Additionally, I am having her maintain her Multiple Vitamins-Calcium (ONE-A-DAY WOMENS PO), OVER THE COUNTER MEDICATION, hydrOXYzine, NON FORMULARY, hydrochlorothiazide, cetirizine, busPIRone, sulfamethoxazole-trimethoprim, enalapril, busPIRone, desvenlafaxine, Symbicort, albuterol, and pregabalin.  1. Lumbar facet arthropathy (  R>L) -Left L3-S1 RFA w/ sedation - Radiofrequency,Lumbar; Future  2. Lumbar spondylosis -As above - Radiofrequency,Lumbar; Future  3. Chronic pain syndrome - pregabalin (LYRICA) 50 MG capsule; 50 mg during day, 100 mg qhs  Dispense: 90 capsule; Refill: 2 - oxyCODONE-acetaminophen (PERCOCET) 5-325 MG tablet; Take 1 tablet by mouth 2 (two) times daily as needed for severe pain. Must last 30 days.  Dispense: 60 tablet; Refill: 0 - oxyCODONE-acetaminophen (PERCOCET) 5-325 MG tablet; Take 1 tablet by mouth 2 (two) times daily as needed for severe pain. Must last 30 days.  Dispense: 60 tablet; Refill: 0 - oxyCODONE-acetaminophen (PERCOCET) 5-325 MG tablet; Take 1 tablet by mouth 2 (two) times daily as needed for severe pain. Must last 30 days.  Dispense: 60 tablet; Refill: 0 - ToxASSURE Select 13 (MW), Urine  Pharmacotherapy (Medications Ordered): Meds ordered this encounter  Medications  . pregabalin (LYRICA) 50 MG capsule    Sig: 50 mg during day, 100 mg qhs    Dispense:  90 capsule    Refill:  2  . oxyCODONE-acetaminophen (PERCOCET) 5-325 MG tablet    Sig: Take 1 tablet by mouth 2 (two) times daily as needed for severe pain. Must last 30 days.    Dispense:  60 tablet    Refill:  0    Chronic Pain. (STOP Act - Not applicable). Fill one day early if closed on scheduled refill date.  Marland Kitchen oxyCODONE-acetaminophen (PERCOCET) 5-325 MG tablet    Sig: Take 1 tablet by mouth 2 (two) times daily as needed for severe pain. Must last 30 days.    Dispense:  60 tablet    Refill:  0    Chronic Pain. (STOP Act - Not applicable). Fill one day early if closed  on scheduled refill date.  Marland Kitchen oxyCODONE-acetaminophen (PERCOCET) 5-325 MG tablet    Sig: Take 1 tablet by mouth 2 (two) times daily as needed for severe pain. Must last 30 days.    Dispense:  60 tablet    Refill:  0    Chronic Pain. (STOP Act - Not applicable). Fill one day early if closed on scheduled refill date.   Orders:  Orders Placed This Encounter  Procedures  . Radiofrequency,Lumbar    Standing Status:   Future    Standing Expiration Date:   02/03/2021    Scheduling Instructions:     Side(s): Left-sided     Level: L3-4, L4-5, & L5-S1 Facets ( L3, L4, L5, & S1 Medial Branch Nerves)     Sedation: With Sedation     Scheduling Timeframe: As soon as pre-approved    Order Specific Question:   Where will this procedure be performed?    Answer:   [facility]  . ToxASSURE Select 13 (MW), Urine    Volume: 30 ml(s). Minimum 3 ml of urine is needed. Document temperature of fresh sample. Indications: Long term (current) use of opiate analgesic (Q68.341)   Follow-up plan:   Return in about 1 week (around 08/14/2019) for Left L3-S1 RFA , with sedation.     Status post diagnostic bilateral L3, L4, L5, S1 facet medial branch nerve blocks, status post right L3, L4, L5, S1 RFA on 05/05/2019; return for the left.     Recent Visits No visits were found meeting these conditions.  Showing recent visits within past 90 days and meeting all other requirements   Today's Visits Date Type Provider Dept  08/07/19 Office Visit Candace Santa, MD [facility]  Showing today's visits and  meeting all other requirements   Future Appointments No visits were found meeting these conditions.  Showing future appointments within next 90 days and meeting all other requirements   I discussed the assessment and treatment plan with the patient. The patient was provided an opportunity to ask questions and all were answered. The patient agreed with the plan and demonstrated an understanding of the  instructions.  Patient advised to call back or seek an in-person evaluation if the symptoms or condition worsens.  Duration of encounter: 25 minutes.  Note by: Candace Santa, MD Date: 08/07/2019; Time: 1:35 PM

## 2019-08-25 ENCOUNTER — Encounter: Payer: Self-pay | Admitting: Internal Medicine

## 2019-08-25 ENCOUNTER — Ambulatory Visit: Payer: No Typology Code available for payment source | Admitting: Internal Medicine

## 2019-08-25 ENCOUNTER — Other Ambulatory Visit: Payer: Self-pay

## 2019-08-25 VITALS — BP 150/84 | HR 80 | Temp 97.9°F | Resp 16 | Ht 69.0 in | Wt 349.0 lb

## 2019-08-25 DIAGNOSIS — J4520 Mild intermittent asthma, uncomplicated: Secondary | ICD-10-CM

## 2019-08-25 DIAGNOSIS — G4733 Obstructive sleep apnea (adult) (pediatric): Secondary | ICD-10-CM

## 2019-08-25 DIAGNOSIS — Z9989 Dependence on other enabling machines and devices: Secondary | ICD-10-CM

## 2019-08-25 DIAGNOSIS — I10 Essential (primary) hypertension: Secondary | ICD-10-CM

## 2019-08-25 NOTE — Progress Notes (Signed)
[facility] [place], [place] [address]  Pulmonary Sleep Medicine   Office Visit Note  Patient Name: Candace Clayton DOB: 07-Oct-1960 MRN [id number]  Date of Service: 08/25/2019  Complaints/HPI: Pt seen today reporting that her job will not let her wear a cloth mask to work without a doctors note.  She reports having trouble breathing through a paper surgical mask, and cloth is better for her.  She walked in today to get this note.  She reports she continues to have SOB, she is not taking her inhalers correctly.  She uses one inhaler before starting the other.  I explained she should use Symbicort daily as prescribed, and use albuterol as needed. She verbalized understanding.   ROS  General: (-) fever, (-) chills, (-) night sweats, (-) weakness Skin: (-) rashes, (-) itching,. Eyes: (-) visual changes, (-) redness, (-) itching. Nose and Sinuses: (-) nasal stuffiness or itchiness, (-) postnasal drip, (-) nosebleeds, (-) sinus trouble. Mouth and Throat: (-) sore throat, (-) hoarseness. Neck: (-) swollen glands, (-) enlarged thyroid, (-) neck pain. Respiratory: - cough, (-) bloody sputum, - shortness of breath, - wheezing. Cardiovascular: - ankle swelling, (-) chest pain. Lymphatic: (-) lymph node enlargement. Neurologic: (-) numbness, (-) tingling. Psychiatric: (-) anxiety, (-) depression   Current Medication: Outpatient Encounter Medications as of 08/25/2019  Medication Sig  . albuterol (VENTOLIN HFA) 108 (90 Base) MCG/ACT inhaler TAKE 2 PUFFS BY MOUTH EVERY 6 HOURS AS NEEDED FOR WHEEZE OR SHORTNESS OF BREATH  . busPIRone (BUSPAR) 10 MG tablet PLEASE SEE ATTACHED FOR DETAILED DIRECTIONS  . busPIRone (BUSPAR) 15 MG tablet Take 1 tablet (15 mg total) by mouth 3 (three) times daily. Take one tab po bid for one week, then increase to tid for anxiety  . cetirizine (ZYRTEC) 10 MG tablet TAKE 1 TABLET(S) BY MOUTH DAILY FOR ALLERGIES  . desvenlafaxine (PRISTIQ) 100 MG  24 hr tablet Take 1 tablet (100 mg total) by mouth daily.  . enalapril (VASOTEC) 20 MG tablet Take 1 tablet (20 mg total) by mouth 2 (two) times daily.  . hydrochlorothiazide (HYDRODIURIL) 25 MG tablet Take 1 tablet (25 mg total) by mouth daily.  . hydrOXYzine (ATARAX/VISTARIL) 10 MG tablet Take 1 tablet (10 mg total) by mouth 3 (three) times daily as needed.  . Multiple Vitamins-Calcium (ONE-A-DAY WOMENS PO) Take 1 tablet by mouth daily.  . NON FORMULARY cpap device  . OVER THE COUNTER MEDICATION 1 tablet daily as needed. Takes generic stool softener for constipation d/t oxycodone.  Marland Kitchen oxyCODONE-acetaminophen (PERCOCET) 5-325 MG tablet Take 1 tablet by mouth 2 (two) times daily as needed for severe pain. Must last 30 days.  Derrill Memo ON 09/06/2019] oxyCODONE-acetaminophen (PERCOCET) 5-325 MG tablet Take 1 tablet by mouth 2 (two) times daily as needed for severe pain. Must last 30 days.  Derrill Memo ON 10/06/2019] oxyCODONE-acetaminophen (PERCOCET) 5-325 MG tablet Take 1 tablet by mouth 2 (two) times daily as needed for severe pain. Must last 30 days.  . pregabalin (LYRICA) 50 MG capsule 50 mg during day, 100 mg qhs  . sulfamethoxazole-trimethoprim (BACTRIM DS) 800-160 MG tablet Take 1 tablet by mouth 2 (two) times daily.  . SYMBICORT 160-4.5 MCG/ACT inhaler TAKE 2 PUFFS BY MOUTH TWICE A DAY   No facility-administered encounter medications on file as of 08/25/2019.    Surgical History: Past Surgical History:  Procedure Laterality Date  . CESAREAN SECTION  1992  . COLONOSCOPY     polyps removed first procedure. 2nd  time all was clear  . COLONOSCOPY WITH PROPOFOL N/A 03/14/2019   Procedure: COLONOSCOPY WITH PROPOFOL;  Surgeon: Lin Landsman, MD;  Location: [facility] ENDOSCOPY;  Service: Gastroenterology;  Laterality: N/A;  . FINGER SURGERY Left 2011   left finger cut off x 2.(only up to last digit)  . HEMATOMA EVACUATION Left 1999   upper part of foot was injured d/t 500lb weight landing on her  foot.   Marland Kitchen LAPAROSCOPIC SALPINGO OOPHERECTOMY Left 04/02/2018   Procedure: LAPAROSCOPIC SALPINGO OOPHORECTOMY;  Surgeon: Malachy Mood, MD;  Location: [facility] OR[facility];  Service: Gynecology;  Laterality: Left;    Medical History: Past Medical History:  Diagnosis Date  . Anginal pain (HCC)    tightness related to anxiety  . Anxiety   . Depression   . GERD (gastroesophageal reflux disease)    throws up easily but not diagnosed with reflux  . History of C-section 32  . Hypertension   . Sleep apnea    uses cpap    Family History: Family History  Problem Relation Age of Onset  . Breast cancer Mother 40       x 3 times  . Colon cancer Mother   . Brain cancer Mother   . Cancer - Colon Mother   . Cancer - Other Mother   . Heart Problems Father   . Colon cancer Brother   . Heart Problems Brother     Social History: Social History   Socioeconomic History  . Marital status: Divorced    Spouse name: Not on file  . Number of children: 1  . Years of education: Not on file  . Highest education level: Not on file  Occupational History  . Occupation: works in Secretary/administrator  Tobacco Use  . Smoking status: Never Smoker  . Smokeless tobacco: Never Used  Substance and Sexual Activity  . Alcohol use: No  . Drug use: No  . Sexual activity: Not Currently    Birth control/protection: Post-menopausal  Other Topics Concern  . Not on file  Social History Narrative   Son has schizophrenia and autism but is fully capable of helping mother after surgery   Social Determinants of Health   Financial Resource Strain:   . Difficulty of Paying Living Expenses: Not on file  Food Insecurity:   . Worried About Charity fundraiser in the Last Year: Not on file  . Ran Out of Food in the Last Year: Not on file  Transportation Needs:   . Lack of Transportation (Medical): Not on file  . Lack of Transportation (Non-Medical): Not on file  Physical Activity:   . Days of Exercise per Week: Not on  file  . Minutes of Exercise per Session: Not on file  Stress:   . Feeling of Stress : Not on file  Social Connections:   . Frequency of Communication with Friends and Family: Not on file  . Frequency of Social Gatherings with Friends and Family: Not on file  . Attends Religious Services: Not on file  . Active Member of Clubs or Organizations: Not on file  . Attends Archivist Meetings: Not on file  . Marital Status: Not on file  Intimate Partner Violence:   . Fear of Current or Ex-Partner: Not on file  . Emotionally Abused: Not on file  . Physically Abused: Not on file  . Sexually Abused: Not on file    Vital Signs: Blood pressure (!) 150/84, pulse 80, temperature 97.9 F (36.6 C), resp. rate  16, height 5\' 9"  (1.753 m), weight (!) 349 lb (158.3 kg), SpO2 97 %.  Examination: General Appearance: The patient is well-developed, well-nourished, and in no distress. Skin: Gross inspection of skin unremarkable. Head: normocephalic, no gross deformities. Eyes: no gross deformities noted. ENT: ears appear grossly normal no exudates. Neck: Supple. No thyromegaly. No LAD. Respiratory: clear bilaterally. Cardiovascular: Normal S1 and S2 without murmur or rub. Extremities: No cyanosis. pulses are equal. Neurologic: Alert and oriented. No involuntary movements.  LABS: No results found for this or any previous visit (from the past 2160 hour(s)).  Radiology: Fluoro (C-Arm) (<60 min) (No Report)  Result Date: 05/05/2019 Fluoro was used, but no Radiologist interpretation will be provided. Please refer to "NOTES" tab for provider progress note.   No results found.  No results found.    Assessment and Plan: Patient Active Problem List   Diagnosis Date Noted  . Bladder spasm 06/02/2019  . Family history of colon cancer in mother   . Right lower quadrant pain 03/12/2019  . History of ovarian cyst 03/12/2019  . Chronic pain syndrome 03/05/2019  . Seasonal allergic rhinitis  due to pollen 08/26/2018  . Lumbar facet arthropathy (R>L) 08/22/2018  . Lumbar spondylosis 08/22/2018  . Left ovarian cyst 03/03/2018  . Lumbar degenerative disc disease 12/23/2017  . Encounter for screening colonoscopy 10/24/2017  . Low back pain with sciatica 07/03/2017  . Allergic rhinitis, unspecified 07/03/2017  . Pain in unspecified knee 07/03/2017  . Obstructive sleep apnea, adult 07/03/2017  . Sedative, hypnotic, or anxiolytic use, unspecified, uncomplicated [id number]  . Hypersomnia 06/27/2017  . Major depressive disorder, recurrent episode, moderate ([place]) 06/27/2017  . Essential hypertension 06/27/2017  . Morbid obesity ([place]) 06/27/2017  . GAD (generalized anxiety disorder) 06/27/2017  . Urinary tract infection 06/27/2017    1. OSA on CPAP Continue to wear cpap as discussed.   2. Mild intermittent asthma without complication Controlled currently, continue present therapy.  3. Essential hypertension Stable, continue to monitor.  4. Morbid obesity ([place]) Obesity Counseling: Risk Assessment: An assessment of behavioral risk factors was made today and includes lack of exercise sedentary lifestyle, lack of portion control and poor dietary habits.  Risk Modification Advice: She was counseled on portion control guidelines. Restricting daily caloric intake to 1800. The detrimental long term effects of obesity on her health and ongoing poor compliance was also discussed with the patient.    General Counseling: I have discussed the findings of the evaluation and examination with Ailah.  I have also discussed any further diagnostic evaluation thatmay be needed or ordered today. Lina verbalizes understanding of the findings of todays visit. We also reviewed her medications today and discussed drug interactions and side effects including but not limited excessive drowsiness and altered mental states. We also discussed that there is always a risk not just to her but also people  around her. she has been encouraged to call the office with any questions or concerns that should arise related to todays visit.  No orders of the defined types were placed in this encounter.    Time spent: 25 minutes This patient was seen by Orson Gear AGNP-C in Collaboration with Dr. Devona Konig as a part of collaborative care agreement.   I have personally obtained a history, examined the patient, evaluated laboratory and imaging results, formulated the assessment and plan and placed orders.    Allyne Gee, MD [facility] Pulmonary and Critical Care Sleep medicine

## 2019-08-26 DIAGNOSIS — G894 Chronic pain syndrome: Secondary | ICD-10-CM | POA: Diagnosis not present

## 2019-08-30 LAB — TOXASSURE SELECT 13 (MW), URINE

## 2019-09-01 ENCOUNTER — Ambulatory Visit: Payer: BC Managed Care – PPO | Admitting: Nurse Practitioner

## 2019-09-08 ENCOUNTER — Other Ambulatory Visit: Payer: Self-pay

## 2019-09-08 MED ORDER — CETIRIZINE HCL 10 MG PO TABS: ORAL_TABLET | ORAL | 4 refills | Status: DC

## 2019-09-17 ENCOUNTER — Telehealth: Payer: Self-pay

## 2019-09-17 NOTE — Telephone Encounter (Signed)
LMOM FOR PATIENT TO CONFIRM AND SCREEN FOR 09-22-19 OV.

## 2019-09-22 ENCOUNTER — Other Ambulatory Visit: Payer: Self-pay

## 2019-09-22 ENCOUNTER — Encounter: Payer: Self-pay | Admitting: Nurse Practitioner

## 2019-09-22 ENCOUNTER — Ambulatory Visit: Payer: No Typology Code available for payment source | Admitting: Nurse Practitioner

## 2019-09-22 VITALS — BP 142/66 | HR 85 | Temp 97.4°F | Resp 16 | Ht 69.0 in | Wt 354.8 lb

## 2019-09-22 DIAGNOSIS — R300 Dysuria: Secondary | ICD-10-CM

## 2019-09-22 DIAGNOSIS — R7301 Impaired fasting glucose: Secondary | ICD-10-CM | POA: Diagnosis not present

## 2019-09-22 DIAGNOSIS — I10 Essential (primary) hypertension: Secondary | ICD-10-CM

## 2019-09-22 DIAGNOSIS — E1165 Type 2 diabetes mellitus with hyperglycemia: Secondary | ICD-10-CM | POA: Diagnosis not present

## 2019-09-22 LAB — POCT URINALYSIS DIPSTICK
Bilirubin, UA: NEGATIVE
Blood, UA: NEGATIVE
Glucose, UA: POSITIVE — AB
Leukocytes, UA: NEGATIVE
Nitrite, UA: NEGATIVE
Protein, UA: NEGATIVE
Spec Grav, UA: 1.02 (ref 1.010–1.025)
Urobilinogen, UA: 0.2 E.U./dL
pH, UA: 5 (ref 5.0–8.0)

## 2019-09-22 MED ORDER — CONTOUR NEXT MONITOR W/DEVICE KIT: 1.0000 | PACK | Freq: Every day | 0 refills | Status: DC

## 2019-09-22 MED ORDER — MICROLET LANCETS MISC: 1.0000 | Freq: Every day | 1 refills | Status: AC

## 2019-09-22 MED ORDER — CONTOUR NEXT TEST VI STRP: 1.0000 | ORAL_STRIP | Freq: Every day | 1 refills | Status: DC

## 2019-09-22 MED ORDER — METFORMIN HCL 500 MG PO TABS: 500.0000 mg | ORAL_TABLET | Freq: Two times a day (BID) | ORAL | 3 refills | Status: DC

## 2019-09-22 NOTE — Progress Notes (Signed)
[facility] [place], [place] [address]  Internal MEDICINE  Office Visit Note  Patient Name: Candace Clayton  [id number]  [id number]  Date of Service: 10/05/2019  Chief Complaint  Patient presents with  . Hypertension  . Gastroesophageal Reflux  . Depression  . Anxiety  . Urinary Tract Infection    The patient is here for follow up. She is complaining of significant urinary frequency. She states that when she stands up, she leaks and has accident. Has been going on for months and gradually getting worse. Has been using OTC AZO and this has not helped. Looking back a t labs done in 02/2019, her HgbA1c was 7.6, glucose was 136. This has not been addressed or retested.. She states that she is very thirsty all the time and has to urinate every few minutes. Produces a lot of urine. She states that every time she gets up, she has to urinate. If she does not make it to the restroom in time, she will have episode of incontinence. She denies pain or burning with urination. She denies abdominal pain, nausea, or vomiting.       Current Medication: Outpatient Encounter Medications as of 09/22/2019  Medication Sig  . albuterol (VENTOLIN HFA) 108 (90 Base) MCG/ACT inhaler TAKE 2 PUFFS BY MOUTH EVERY 6 HOURS AS NEEDED FOR WHEEZE OR SHORTNESS OF BREATH  . busPIRone (BUSPAR) 10 MG tablet PLEASE SEE ATTACHED FOR DETAILED DIRECTIONS  . busPIRone (BUSPAR) 15 MG tablet Take 1 tablet (15 mg total) by mouth 3 (three) times daily. Take one tab po bid for one week, then increase to tid for anxiety  . cetirizine (ZYRTEC) 10 MG tablet TAKE 1 TABLET(S) BY MOUTH DAILY FOR ALLERGIES  . desvenlafaxine (PRISTIQ) 100 MG 24 hr tablet Take 1 tablet (100 mg total) by mouth daily.  . enalapril (VASOTEC) 20 MG tablet Take 1 tablet (20 mg total) by mouth 2 (two) times daily.  . hydrOXYzine (ATARAX/VISTARIL) 10 MG tablet Take 1 tablet (10 mg total) by mouth 3 (three) times daily as needed.  .  Multiple Vitamins-Calcium (ONE-A-DAY WOMENS PO) Take 1 tablet by mouth daily.  . NON FORMULARY cpap device  . OVER THE COUNTER MEDICATION 1 tablet daily as needed. Takes generic stool softener for constipation d/t oxycodone.  Marland Kitchen oxyCODONE-acetaminophen (PERCOCET) 5-325 MG tablet Take 1 tablet by mouth 2 (two) times daily as needed for severe pain. Must last 30 days.  Derrill Memo ON 10/06/2019] oxyCODONE-acetaminophen (PERCOCET) 5-325 MG tablet Take 1 tablet by mouth 2 (two) times daily as needed for severe pain. Must last 30 days.  . pregabalin (LYRICA) 50 MG capsule 50 mg during day, 100 mg qhs  . SYMBICORT 160-4.5 MCG/ACT inhaler TAKE 2 PUFFS BY MOUTH TWICE A DAY  . [DISCONTINUED] hydrochlorothiazide (HYDRODIURIL) 25 MG tablet Take 1 tablet (25 mg total) by mouth daily.  . metFORMIN (GLUCOPHAGE) 500 MG tablet Take 1 tablet (500 mg total) by mouth 2 (two) times daily with a meal.  . [DISCONTINUED] sulfamethoxazole-trimethoprim (BACTRIM DS) 800-160 MG tablet Take 1 tablet by mouth 2 (two) times daily. (Patient not taking: Reported on 09/22/2019)   No facility-administered encounter medications on file as of 09/22/2019.    Surgical History: Past Surgical History:  Procedure Laterality Date  . CESAREAN SECTION  1992  . COLONOSCOPY     polyps removed first procedure. 2nd time all was clear  . COLONOSCOPY WITH PROPOFOL N/A 03/14/2019   Procedure: COLONOSCOPY WITH PROPOFOL;  Surgeon: Sherri Sear  Reece Levy, MD;  Location: [facility];  Service: Gastroenterology;  Laterality: N/A;  . FINGER SURGERY Left 2011   left finger cut off x 2.(only up to last digit)  . HEMATOMA EVACUATION Left 1999   upper part of foot was injured d/t 500lb weight landing on her foot.   Marland Kitchen LAPAROSCOPIC SALPINGO OOPHERECTOMY Left 04/02/2018   Procedure: LAPAROSCOPIC SALPINGO OOPHORECTOMY;  Surgeon: Malachy Mood, MD;  Location: [facility];  Service: Gynecology;  Laterality: Left;    Medical History: Past Medical History:   Diagnosis Date  . Anginal pain (HCC)    tightness related to anxiety  . Anxiety   . Depression   . GERD (gastroesophageal reflux disease)    throws up easily but not diagnosed with reflux  . History of C-section 52  . Hypertension   . Sleep apnea    uses cpap    Family History: Family History  Problem Relation Age of Onset  . Breast cancer Mother 40       x 3 times  . Colon cancer Mother   . Brain cancer Mother   . Cancer - Colon Mother   . Cancer - Other Mother   . Heart Problems Father   . Colon cancer Brother   . Heart Problems Brother     Social History   Socioeconomic History  . Marital status: Divorced    Spouse name: Not on file  . Number of children: 1  . Years of education: Not on file  . Highest education level: Not on file  Occupational History  . Occupation: works in Secretary/administrator  Tobacco Use  . Smoking status: Never Smoker  . Smokeless tobacco: Never Used  Substance and Sexual Activity  . Alcohol use: No  . Drug use: No  . Sexual activity: Not Currently    Birth control/protection: Post-menopausal  Other Topics Concern  . Not on file  Social History Narrative   Son has schizophrenia and autism but is fully capable of helping mother after surgery   Social Determinants of Health   Financial Resource Strain:   . Difficulty of Paying Living Expenses:   Food Insecurity:   . Worried About Charity fundraiser in the Last Year:   . Arboriculturist in the Last Year:   Transportation Needs:   . Film/video editor (Medical):   Marland Kitchen Lack of Transportation (Non-Medical):   Physical Activity:   . Days of Exercise per Week:   . Minutes of Exercise per Session:   Stress:   . Feeling of Stress :   Social Connections:   . Frequency of Communication with Friends and Family:   . Frequency of Social Gatherings with Friends and Family:   . Attends Religious Services:   . Active Member of Clubs or Organizations:   . Attends Archivist  Meetings:   Marland Kitchen Marital Status:   Intimate Partner Violence:   . Fear of Current or Ex-Partner:   . Emotionally Abused:   Marland Kitchen Physically Abused:   . Sexually Abused:       Review of Systems  Constitutional: Positive for fatigue. Negative for activity change, chills and unexpected weight change.  HENT: Negative for congestion, postnasal drip, rhinorrhea, sneezing and sore throat.   Respiratory: Negative for cough, chest tightness and shortness of breath.   Cardiovascular: Negative for chest pain and palpitations.  Gastrointestinal: Negative for abdominal pain, constipation, diarrhea, nausea and vomiting.  Endocrine: Negative for heat intolerance, polydipsia and polyuria.  Elevated blood sugars on past labs.   Genitourinary: Positive for frequency and urgency. Negative for dysuria and flank pain.  Musculoskeletal: Positive for back pain. Negative for arthralgias, joint swelling and neck pain.  Skin: Negative for rash.  Allergic/Immunologic: Negative for environmental allergies.  Neurological: Negative for dizziness, tremors, numbness and headaches.  Hematological: Negative for adenopathy. Does not bruise/bleed easily.  Psychiatric/Behavioral: Positive for dysphoric mood. Negative for behavioral problems (Depression), sleep disturbance and suicidal ideas. The patient is nervous/anxious.     Today's Vitals   09/22/19 1010  BP: (!) 142/66  Pulse: 85  Resp: 16  Temp: (!) 97.4 F (36.3 C)  SpO2: 98%  Weight: (!) 354 lb 12.8 oz (160.9 kg)  Height: 5\' 9"  (1.753 m)   Body mass index is 52.39 kg/m.  Physical Exam Vitals and nursing note reviewed.  Constitutional:      General: She is not in acute distress.    Appearance: Normal appearance. She is well-developed. She is obese. She is not diaphoretic.  HENT:     Head: Normocephalic and atraumatic.     Mouth/Throat:     Pharynx: No oropharyngeal exudate.  Eyes:     Pupils: Pupils are equal, round, and reactive to light.  Neck:      Thyroid: No thyromegaly.     Vascular: No JVD.     Trachea: No tracheal deviation.  Cardiovascular:     Rate and Rhythm: Normal rate and regular rhythm.     Heart sounds: Normal heart sounds. No murmur. No friction rub. No gallop.   Pulmonary:     Effort: Pulmonary effort is normal. No respiratory distress.     Breath sounds: Normal breath sounds. No wheezing or rales.  Chest:     Chest wall: No tenderness.  Abdominal:     Palpations: Abdomen is soft.  Genitourinary:    Comments: Urine positive for glucose. Musculoskeletal:        General: Normal range of motion.     Cervical back: Normal range of motion and neck supple.  Lymphadenopathy:     Cervical: No cervical adenopathy.  Skin:    General: Skin is warm and dry.  Neurological:     Mental Status: She is alert and oriented to person, place, and time.     Cranial Nerves: No cranial nerve deficit.  Psychiatric:        Mood and Affect: Mood normal.        Behavior: Behavior normal.        Thought Content: Thought content normal.        Judgment: Judgment normal.     Assessment/Plan: 1. Dysuria U/a positive for glucose without evidence of infection or other abnormalities.  - POCT Urinalysis Dipstick  2. Type 2 diabetes mellitus with hyperglycemia, without long-term current use of insulin (HCC) HgbA1c is 10.1 today. Start metformin 500mg  twice daily. Glucometer provided for patient and she was taught how to check blood sugars. Advised her to check her blood sugars every morning, prior to eating. Goal is to have blood sugars consistently under 150.  Written information regarding meal planning with diabetes given to patient during visit.  - metFORMIN (GLUCOPHAGE) 500 MG tablet; Take 1 tablet (500 mg total) by mouth 2 (two) times daily with a meal.  Dispense: 60 tablet; Refill: 3  3. Abnormal fasting glucose - POCT HgB A1C  4. Essential hypertension Stable. Continue bp medication as prescribed   General Counseling: Adalynd  verbalizes understanding of the findings of todays  visit and agrees with plan of treatment. I have discussed any further diagnostic evaluation that may be needed or ordered today. We also reviewed her medications today. she has been encouraged to call the office with any questions or concerns that should arise related to todays visit.  Diabetes Counseling:  1. Addition of ACE inh/ ARB'S for nephroprotection. Microalbumin is updated  2. Diabetic foot care, prevention of complications. Podiatry consult 3. Exercise and lose weight.  4. Diabetic eye examination, Diabetic eye exam is updated  5. Monitor blood sugar closlely. nutrition counseling.  6. Sign and symptoms of hypoglycemia including shaking sweating,confusion and headaches.  This patient was seen by Leretha Pol FNP Collaboration with Dr Lavera Guise as a part of collaborative care agreement  Orders Placed This Encounter  Procedures  . POCT Urinalysis Dipstick  . POCT HgB A1C    Meds ordered this encounter  Medications  . metFORMIN (GLUCOPHAGE) 500 MG tablet    Sig: Take 1 tablet (500 mg total) by mouth 2 (two) times daily with a meal.    Dispense:  60 tablet    Refill:  3    Order Specific Question:   Supervising Provider    Answer:   Lavera Guise [id number]    Total time spent: 82 Minutes   Time spent includes review of chart, medications, test results, and follow up plan with the patient.      Dr Lavera Guise Internal medicine

## 2019-09-30 ENCOUNTER — Other Ambulatory Visit: Payer: Self-pay

## 2019-09-30 DIAGNOSIS — I10 Essential (primary) hypertension: Secondary | ICD-10-CM

## 2019-09-30 MED ORDER — HYDROCHLOROTHIAZIDE 25 MG PO TABS: 25.0000 mg | ORAL_TABLET | Freq: Every day | ORAL | 5 refills | Status: DC

## 2019-10-05 DIAGNOSIS — E1165 Type 2 diabetes mellitus with hyperglycemia: Secondary | ICD-10-CM | POA: Insufficient documentation

## 2019-10-05 DIAGNOSIS — R300 Dysuria: Secondary | ICD-10-CM | POA: Insufficient documentation

## 2019-10-05 DIAGNOSIS — R7301 Impaired fasting glucose: Secondary | ICD-10-CM | POA: Insufficient documentation

## 2019-10-06 LAB — POCT GLYCOSYLATED HEMOGLOBIN (HGB A1C): Hemoglobin A1C: 10.1 % — AB (ref 4.0–5.6)

## 2019-10-07 DIAGNOSIS — G4733 Obstructive sleep apnea (adult) (pediatric): Secondary | ICD-10-CM | POA: Diagnosis not present

## 2019-10-16 ENCOUNTER — Telehealth: Payer: Self-pay

## 2019-10-16 NOTE — Telephone Encounter (Signed)
Confirmed and screened for 10-20-19 ov. Patient advised to bring blood sugar log.

## 2019-10-20 ENCOUNTER — Ambulatory Visit: Payer: No Typology Code available for payment source | Admitting: Nurse Practitioner

## 2019-10-20 ENCOUNTER — Encounter: Payer: Self-pay | Admitting: Nurse Practitioner

## 2019-10-20 ENCOUNTER — Other Ambulatory Visit: Payer: Self-pay

## 2019-10-20 VITALS — BP 148/73 | HR 64 | Temp 97.6°F | Resp 16 | Ht 69.0 in | Wt 346.0 lb

## 2019-10-20 DIAGNOSIS — I10 Essential (primary) hypertension: Secondary | ICD-10-CM

## 2019-10-20 DIAGNOSIS — G4733 Obstructive sleep apnea (adult) (pediatric): Secondary | ICD-10-CM

## 2019-10-20 DIAGNOSIS — F411 Generalized anxiety disorder: Secondary | ICD-10-CM | POA: Diagnosis not present

## 2019-10-20 DIAGNOSIS — E1165 Type 2 diabetes mellitus with hyperglycemia: Secondary | ICD-10-CM | POA: Diagnosis not present

## 2019-10-20 DIAGNOSIS — Z9989 Dependence on other enabling machines and devices: Secondary | ICD-10-CM

## 2019-10-20 MED ORDER — BUSPIRONE HCL 15 MG PO TABS: 15.0000 mg | ORAL_TABLET | Freq: Three times a day (TID) | ORAL | 3 refills | Status: DC

## 2019-10-20 NOTE — Progress Notes (Signed)
[facility] [place], [place] [address]  Internal MEDICINE  Office Visit Note  Patient Name: Candace Clayton  [id number]  [id number]  Date of Service: 11/04/2019  Chief Complaint  Patient presents with  . Diabetes  . Hypertension  . Gastroesophageal Reflux  . Depression  . Anxiety    The patient is here for routine follow up . She was recently diagnosed with Type 2 diabetes and started on metformin '500mg'$  bid. She is tolerating the medication well. Her blood sugars are consistently running in the low 100s. She has not had negative side effects associated with taking the metformin.       Current Medication: Outpatient Encounter Medications as of 10/20/2019  Medication Sig  . albuterol (VENTOLIN HFA) 108 (90 Base) MCG/ACT inhaler TAKE 2 PUFFS BY MOUTH EVERY 6 HOURS AS NEEDED FOR WHEEZE OR SHORTNESS OF BREATH  . Blood Glucose Monitoring Suppl (CONTOUR NEXT MONITOR) w/Device KIT 1 each by Does not apply route daily. DX-E11.65  . busPIRone (BUSPAR) 15 MG tablet Take 1 tablet (15 mg total) by mouth 3 (three) times daily. Take one tab po bid for one week, then increase to tid for anxiety  . cetirizine (ZYRTEC) 10 MG tablet TAKE 1 TABLET(S) BY MOUTH DAILY FOR ALLERGIES  . desvenlafaxine (PRISTIQ) 100 MG 24 hr tablet Take 1 tablet (100 mg total) by mouth daily.  . enalapril (VASOTEC) 20 MG tablet Take 1 tablet (20 mg total) by mouth 2 (two) times daily.  Marland Kitchen glucose blood (CONTOUR NEXT TEST) test strip 1 each by Other route daily. DX E11.65  . hydrochlorothiazide (HYDRODIURIL) 25 MG tablet Take 1 tablet (25 mg total) by mouth daily.  . hydrOXYzine (ATARAX/VISTARIL) 10 MG tablet Take 1 tablet (10 mg total) by mouth 3 (three) times daily as needed.  . metFORMIN (GLUCOPHAGE) 500 MG tablet Take 1 tablet (500 mg total) by mouth 2 (two) times daily with a meal.  . Microlet Lancets MISC 1 each by Does not apply route daily. DX-E11.65  . Multiple Vitamins-Calcium (ONE-A-DAY  WOMENS PO) Take 1 tablet by mouth daily.  . NON FORMULARY cpap device  . OVER THE COUNTER MEDICATION 1 tablet daily as needed. Takes generic stool softener for constipation d/t oxycodone.  . SYMBICORT 160-4.5 MCG/ACT inhaler TAKE 2 PUFFS BY MOUTH TWICE A DAY  . [DISCONTINUED] busPIRone (BUSPAR) 10 MG tablet PLEASE SEE ATTACHED FOR DETAILED DIRECTIONS  . [DISCONTINUED] busPIRone (BUSPAR) 15 MG tablet Take 1 tablet (15 mg total) by mouth 3 (three) times daily. Take one tab po bid for one week, then increase to tid for anxiety  . [DISCONTINUED] oxyCODONE-acetaminophen (PERCOCET) 5-325 MG tablet Take 1 tablet by mouth 2 (two) times daily as needed for severe pain. Must last 30 days.  . [DISCONTINUED] pregabalin (LYRICA) 50 MG capsule 50 mg during day, 100 mg qhs   No facility-administered encounter medications on file as of 10/20/2019.    Surgical History: Past Surgical History:  Procedure Laterality Date  . CESAREAN SECTION  1992  . COLONOSCOPY     polyps removed first procedure. 2nd time all was clear  . COLONOSCOPY WITH PROPOFOL N/A 03/14/2019   Procedure: COLONOSCOPY WITH PROPOFOL;  Surgeon: Lin Landsman, MD;  Location: [facility];  Service: Gastroenterology;  Laterality: N/A;  . FINGER SURGERY Left 2011   left finger cut off x 2.(only up to last digit)  . HEMATOMA EVACUATION Left 1999   upper part of foot was injured d/t 500lb weight landing on  her foot.   Marland Kitchen LAPAROSCOPIC SALPINGO OOPHERECTOMY Left 04/02/2018   Procedure: LAPAROSCOPIC SALPINGO OOPHORECTOMY;  Surgeon: Malachy Mood, MD;  Location: [facility];  Service: Gynecology;  Laterality: Left;    Medical History: Past Medical History:  Diagnosis Date  . Anginal pain (HCC)    tightness related to anxiety  . Anxiety   . Depression   . GERD (gastroesophageal reflux disease)    throws up easily but not diagnosed with reflux  . History of C-section 30  . Hypertension   . Sleep apnea    uses cpap    Family  History: Family History  Problem Relation Age of Onset  . Breast cancer Mother 40       x 3 times  . Colon cancer Mother   . Brain cancer Mother   . Cancer - Colon Mother   . Cancer - Other Mother   . Heart Problems Father   . Colon cancer Brother   . Heart Problems Brother     Social History   Socioeconomic History  . Marital status: Divorced    Spouse name: Not on file  . Number of children: 1  . Years of education: Not on file  . Highest education level: Not on file  Occupational History  . Occupation: works in Secretary/administrator  Tobacco Use  . Smoking status: Never Smoker  . Smokeless tobacco: Never Used  Substance and Sexual Activity  . Alcohol use: No  . Drug use: No  . Sexual activity: Not Currently    Birth control/protection: Post-menopausal  Other Topics Concern  . Not on file  Social History Narrative   Son has schizophrenia and autism but is fully capable of helping mother after surgery   Social Determinants of Health   Financial Resource Strain:   . Difficulty of Paying Living Expenses:   Food Insecurity:   . Worried About Charity fundraiser in the Last Year:   . Arboriculturist in the Last Year:   Transportation Needs:   . Film/video editor (Medical):   Marland Kitchen Lack of Transportation (Non-Medical):   Physical Activity:   . Days of Exercise per Week:   . Minutes of Exercise per Session:   Stress:   . Feeling of Stress :   Social Connections:   . Frequency of Communication with Friends and Family:   . Frequency of Social Gatherings with Friends and Family:   . Attends Religious Services:   . Active Member of Clubs or Organizations:   . Attends Archivist Meetings:   Marland Kitchen Marital Status:   Intimate Partner Violence:   . Fear of Current or Ex-Partner:   . Emotionally Abused:   Marland Kitchen Physically Abused:   . Sexually Abused:       Review of Systems  Constitutional: Positive for fatigue. Negative for activity change, chills and unexpected  weight change.  HENT: Negative for congestion, postnasal drip, rhinorrhea, sneezing and sore throat.   Respiratory: Negative for cough, chest tightness and shortness of breath.   Cardiovascular: Negative for chest pain and palpitations.  Gastrointestinal: Negative for abdominal pain, constipation, diarrhea, nausea and vomiting.  Endocrine: Negative for cold intolerance, heat intolerance, polydipsia and polyuria.       Checking fasting blood sugars every day. Running in the low 100s.  Genitourinary: Positive for frequency and urgency. Negative for dysuria and flank pain.  Musculoskeletal: Positive for back pain. Negative for arthralgias, joint swelling and neck pain.  Skin: Negative for rash.  Allergic/Immunologic: Negative for environmental allergies.  Neurological: Negative for dizziness, tremors, numbness and headaches.  Hematological: Negative for adenopathy. Does not bruise/bleed easily.  Psychiatric/Behavioral: Positive for dysphoric mood. Negative for behavioral problems (Depression), sleep disturbance and suicidal ideas. The patient is nervous/anxious.     Today's Vitals   10/20/19 0959  BP: (!) 148/73  Pulse: 64  Resp: 16  Temp: 97.6 F (36.4 C)  SpO2: 98%  Weight: (!) 346 lb (156.9 kg)  Height: _0  (1.753 m)   Body mass index is 51.1 kg/m.  Physical Exam Vitals and nursing note reviewed.  Constitutional:      General: She is not in acute distress.    Appearance: Normal appearance. She is well-developed. She is obese. She is not diaphoretic.  HENT:     Head: Normocephalic and atraumatic.     Mouth/Throat:     Pharynx: No oropharyngeal exudate.  Eyes:     Pupils: Pupils are equal, round, and reactive to light.  Neck:     Thyroid: No thyromegaly.     Vascular: No JVD.     Trachea: No tracheal deviation.  Cardiovascular:     Rate and Rhythm: Normal rate and regular rhythm.     Heart sounds: Normal heart sounds. No murmur. No friction rub. No gallop.   Pulmonary:      Effort: Pulmonary effort is normal. No respiratory distress.     Breath sounds: Normal breath sounds. No wheezing or rales.  Chest:     Chest wall: No tenderness.  Abdominal:     Palpations: Abdomen is soft.  Genitourinary:    Comments: Urine positive for glucose. Musculoskeletal:        General: Normal range of motion.     Cervical back: Normal range of motion and neck supple.  Lymphadenopathy:     Cervical: No cervical adenopathy.  Skin:    General: Skin is warm and dry.  Neurological:     Mental Status: She is alert and oriented to person, place, and time.     Cranial Nerves: No cranial nerve deficit.  Psychiatric:        Attention and Perception: Attention and perception normal.        Mood and Affect: Affect normal. Mood is anxious.        Speech: Speech normal.        Behavior: Behavior normal. Behavior is cooperative.        Thought Content: Thought content normal.        Cognition and Memory: Cognition and memory normal.        Judgment: Judgment normal.    Assessment/Plan: 1. Type 2 diabetes mellitus with hyperglycemia, without long-term current use of insulin ([place]) Patient tolerating addition of metformin well. Checking her blood sugar daily. Generally running in the low 100s. Continue to monitor closely.   2. Essential hypertension Stable. Continue bp medication as prescribed   3. GAD (generalized anxiety disorder) Continue all medications as prescribed. May take buspirone 40m up to three times daily if needed for acute anxiety. Refills provided today.  - busPIRone (BUSPAR) 15 MG tablet; Take 1 tablet (15 mg total) by mouth 3 (three) times daily. Take one tab po bid for one week, then increase to tid for anxiety  Dispense: 90 tablet; Refill: 3  4. OSA on CPAP Continue regular visits with Dr. SDevona Konigfor CPAP management.   General Counseling: Carmell verbalizes understanding of the findings of todays visit and agrees with plan of treatment. I  have discussed  any further diagnostic evaluation that may be needed or ordered today. We also reviewed her medications today. she has been encouraged to call the office with any questions or concerns that should arise related to todays visit.   Diabetes Counseling:  1. Addition of ACE inh/ ARB'S for nephroprotection. Microalbumin is updated  2. Diabetic foot care, prevention of complications. Podiatry consult 3. Exercise and lose weight.  4. Diabetic eye examination, Diabetic eye exam is updated  5. Monitor blood sugar closlely. nutrition counseling.  6. Sign and symptoms of hypoglycemia including shaking sweating,confusion and headaches.  This patient was seen by [place] with Dr Lavera Guise as a part of collaborative care agreement  Meds ordered this encounter  Medications  . busPIRone (BUSPAR) 15 MG tablet    Sig: Take 1 tablet (15 mg total) by mouth 3 (three) times daily. Take one tab po bid for one week, then increase to tid for anxiety    Dispense:  90 tablet    Refill:  3    Order Specific Question:   Supervising Provider    Answer:   Lavera Guise [[id number]]    Total time spent: 30 Minutes  Time spent includes review of chart, medications, test results, and follow up plan with the patient.      Dr Lavera Guise Internal medicine

## 2019-11-04 ENCOUNTER — Other Ambulatory Visit: Payer: Self-pay | Admitting: Student in an Organized Health Care Education/Training Program

## 2019-11-04 ENCOUNTER — Telehealth: Payer: Self-pay

## 2019-11-04 DIAGNOSIS — G894 Chronic pain syndrome: Secondary | ICD-10-CM

## 2019-11-04 MED ORDER — OXYCODONE-ACETAMINOPHEN 5-325 MG PO TABS: 1.0000 | ORAL_TABLET | Freq: Two times a day (BID) | ORAL | 0 refills | Status: DC | PRN

## 2019-11-04 MED ORDER — PREGABALIN 50 MG PO CAPS: ORAL_CAPSULE | ORAL | 2 refills | Status: DC

## 2019-11-04 NOTE — Telephone Encounter (Signed)
Dr. Holley Raring not in the office until 11/10/2019. Patient needs refill before then of her Lyrica and Oxy. I have a message out to Dr. Holley Raring for guidance.

## 2019-11-04 NOTE — Progress Notes (Unsigned)
Notified by nursing staff that patient is out of her oxycodone and Lyrica.  We will send in prescription as below.  Please keep scheduled appointment.  Attempted to review PMP however issue with [place] PMP database.  Requested Prescriptions   Signed Prescriptions Disp Refills  . oxyCODONE-acetaminophen (PERCOCET) 5-325 MG tablet 60 tablet 0    Sig: Take 1 tablet by mouth 2 (two) times daily as needed for severe pain. Must last 30 days.  . pregabalin (LYRICA) 50 MG capsule 90 capsule 2    Sig: 50 mg during day, 100 mg qhs

## 2019-11-04 NOTE — Telephone Encounter (Signed)
She only has 2 pills left of oxy and pregabaline. She has a med refill on 5/20. She says she cant work without her oxycodone. I told her Dr. Holley Raring was out all week so there was probably nothing you could do but that I would put a note back.

## 2019-11-04 NOTE — Telephone Encounter (Signed)
Spoke with Dr. Holley Raring. States he will take care of it by tomorrow 05/12.

## 2019-11-11 ENCOUNTER — Encounter: Payer: BC Managed Care – PPO | Admitting: Student in an Organized Health Care Education/Training Program

## 2019-11-12 ENCOUNTER — Telehealth: Payer: Self-pay

## 2019-11-12 NOTE — Telephone Encounter (Signed)
LM for patient to call office for pre virtual appointment questions.  

## 2019-11-13 ENCOUNTER — Telehealth: Payer: Self-pay

## 2019-11-13 ENCOUNTER — Telehealth: Payer: Self-pay | Admitting: Student in an Organized Health Care Education/Training Program

## 2019-11-13 ENCOUNTER — Ambulatory Visit
Payer: BC Managed Care – PPO | Attending: Student in an Organized Health Care Education/Training Program | Admitting: Student in an Organized Health Care Education/Training Program

## 2019-11-13 ENCOUNTER — Other Ambulatory Visit: Payer: Self-pay

## 2019-11-13 ENCOUNTER — Encounter: Payer: Self-pay | Admitting: Student in an Organized Health Care Education/Training Program

## 2019-11-13 VITALS — Ht 69.0 in | Wt 340.0 lb

## 2019-11-13 DIAGNOSIS — G894 Chronic pain syndrome: Secondary | ICD-10-CM | POA: Diagnosis not present

## 2019-11-13 DIAGNOSIS — M5136 Other intervertebral disc degeneration, lumbar region: Secondary | ICD-10-CM

## 2019-11-13 DIAGNOSIS — M47816 Spondylosis without myelopathy or radiculopathy, lumbar region: Secondary | ICD-10-CM | POA: Diagnosis not present

## 2019-11-13 MED ORDER — OXYCODONE-ACETAMINOPHEN 5-325 MG PO TABS: 1.0000 | ORAL_TABLET | Freq: Two times a day (BID) | ORAL | 0 refills | Status: DC | PRN

## 2019-11-13 NOTE — Telephone Encounter (Signed)
Patient lvmail 5-19 at 3:58 returning nurse call

## 2019-11-13 NOTE — Telephone Encounter (Signed)
Pt was called, message was left on answering service.

## 2019-11-13 NOTE — Progress Notes (Signed)
Patient: Candace Clayton  Service Category: E/M  Provider: Bilal Lateef, MD  DOB: 02/06/1961  DOS: 11/13/2019  Location: Office  MRN: [id number]  Setting: Ambulatory outpatient  Referring Provider: Boscia, Heather E, NP  Type: Established Patient  Specialty: Interventional Pain Management  PCP: Boscia, Heather E, NP  Location: Home  Delivery: TeleHealth     Virtual Encounter - Pain Management PROVIDER NOTE: Information contained herein reflects review and annotations entered in association with encounter. Interpretation of such information and data should be left to medically-trained personnel. Information provided to patient can be located elsewhere in the medical record under "Patient Instructions". Document created using STT-dictation technology, any transcriptional errors that may result from process are unintentional.    Contact & Pharmacy Preferred: [phone] Home: [phone] (home) Mobile: [phone] (mobile) E-mail: No e-mail address on record  [id number] - [place], [place] - [address] [place] [address] Phone: [phone] Fax: [phone]  [id number] - [place] ([place]), [place] - [address] [place] ([place])  [address] Phone: [phone] Fax: [phone]   Pre-screening  Candace Clayton offered "in-person" vs "virtual" encounter. She indicated preferring virtual for this encounter.   Reason COVID-19*  Social distancing based on CDC and AMA recommendations.   I contacted Candace Clayton on 11/13/2019 via video conference.      I clearly identified myself as Bilal Lateef, MD. I verified that I was speaking with the correct person using two identifiers (Name: Candace Clayton, and date of birth: 10/31/1960).  Consent I sought verbal advanced consent from Candace Clayton for virtual visit interactions. I informed Candace Clayton of possible security and privacy concerns, risks, and limitations  associated with providing "not-in-person" medical evaluation and management services. I also informed Candace Clayton of the availability of "in-person" appointments. Finally, I informed her that there would be a charge for the virtual visit and that she could be  personally, fully or partially, financially responsible for it. Candace Clayton expressed understanding and agreed to proceed.   Historic Elements   Candace Clayton is a 59 y.o. year old, female patient evaluated today after her last contact with our practice on 11/13/2019. Candace Clayton  has a past medical history of Anginal pain (HCC), Anxiety, Depression, GERD (gastroesophageal reflux disease), History of C-section (1992), Hypertension, and Sleep apnea. She also  has a past surgical history that includes Cesarean section (1992); Finger surgery (Left, 2011); Hematoma evacuation (Left, 1999); Colonoscopy; Laparoscopic salpingo oophorectomy (Left, 04/02/2018); and Colonoscopy with propofol (N/A, 03/14/2019). Candace Clayton has a current medication list which includes the following prescription(s): albuterol, contour next monitor, buspirone, cetirizine, desvenlafaxine, enalapril, contour next test, hydrochlorothiazide, hydroxyzine, metformin, microlet lancets, multiple vitamins-minerals, NON FORMULARY, OVER THE COUNTER MEDICATION, pregabalin, symbicort, [START ON 12/06/2019] oxycodone-acetaminophen, and [START ON 01/05/2020] oxycodone-acetaminophen. She  reports that she has never smoked. She has never used smokeless tobacco. She reports that she does not drink alcohol or use drugs. Candace Clayton is allergic to gabapentin.   HPI  Today, she is being contacted for medication management.   No change in medical history since last visit.  Patient's pain is at baseline.  Patient continues multimodal pain regimen as prescribed.  States that it provides pain relief and improvement in functional status.  Pharmacotherapy Assessment  Analgesic: 11/06/2019  1   11/04/2019   Oxycodone-Acetaminophen 5-325  60.00  30 Bi Lat   [id number]   Nor (0921)   0    15.00 MME  Comm Ins   [place]    Monitoring: [place] PMP: PDMP reviewed during this encounter.       Pharmacotherapy: No side-effects or adverse reactions reported. Compliance: No problems identified. Effectiveness: Clinically acceptable. Plan: Refer to "POC".  UDS:  Summary  Date Value Ref Range Status  08/26/2019 Note  Final    Comment:    ==================================================================== [employer] Select 13 (MW) ==================================================================== Test                             Result       Flag       Units Drug Present and Declared for Prescription Verification   Oxycodone                      139          EXPECTED   ng/mg creat   Oxymorphone                    688          EXPECTED   ng/mg creat   Noroxycodone                   473          EXPECTED   ng/mg creat   Noroxymorphone                 163          EXPECTED   ng/mg creat    Sources of oxycodone are scheduled prescription medications.    Oxymorphone, noroxycodone, and noroxymorphone are expected    metabolites of oxycodone. Oxymorphone is also available as a    scheduled prescription medication. ==================================================================== Test                      Result    Flag   Units      Ref Range   Creatinine              64               mg/dL      >=20 ==================================================================== Declared Medications:  The flagging and interpretation on this report are based on the  following declared medications.  Unexpected results may arise from  inaccuracies in the declared medications.  **Note: The testing scope of this panel includes these medications:  Oxycodone  **Note: The testing scope of this panel does not include the  following reported medications:  Acetaminophen  Albuterol  Budesonide (Symbicort)  Buspirone  Cetirizine   Desvenlafaxine (Pristiq)  Docusate (Stool Softener)  Enalapril  Formoterol (Symbicort)  Hydrochlorothiazide  Hydroxyzine  Multivitamin  Pregabalin  Sulfamethoxazole (Bactrim)  Trimethoprim (Bactrim) ==================================================================== For clinical consultation, please call [phone]. ====================================================================    Laboratory Chemistry Profile   Renal Lab Results  Component Value Date   BUN 12 03/06/2019   CREATININE 0.80 03/06/2019   BCR 15 03/06/2019   GFRAA 94 03/06/2019   GFRNONAA 82 03/06/2019     Hepatic Lab Results  Component Value Date   AST 26 03/06/2019   ALT 30 03/06/2019   ALBUMIN 3.9 03/06/2019   ALKPHOS 92 03/06/2019     Electrolytes Lab Results  Component Value Date   NA 139 03/06/2019   K 4.9 03/06/2019   CL 105 03/06/2019   CALCIUM 9.1 03/06/2019     Bone Lab Results  Component Value  Date   VD25OH 17.2 (L) 03/06/2019     Inflammation (CRP: Acute Phase) (ESR: Chronic Phase) No results found for: CRP, ESRSEDRATE, LATICACIDVEN     Note: Above Lab results reviewed.   Assessment  The primary encounter diagnosis was Lumbar facet arthropathy (R>L). Diagnoses of Chronic pain syndrome, Lumbar spondylosis, Lumbar degenerative disc disease, and Morbid obesity ([place]) were also pertinent to this visit.  Plan of Care  Ms. TAWNI MELKONIAN has a current medication list which includes the following long-term medication(s): albuterol, cetirizine, desvenlafaxine, enalapril, hydrochlorothiazide, metformin, pregabalin, and symbicort.  Pharmacotherapy (Medications Ordered): Meds ordered this encounter  Medications  . oxyCODONE-acetaminophen (PERCOCET) 5-325 MG tablet    Sig: Take 1 tablet by mouth 2 (two) times daily as needed for severe pain. Must last 30 days.    Dispense:  60 tablet    Refill:  0    Chronic Pain. (STOP Act - Not applicable). Fill one day early if closed on  scheduled refill date.  Marland Kitchen oxyCODONE-acetaminophen (PERCOCET) 5-325 MG tablet    Sig: Take 1 tablet by mouth 2 (two) times daily as needed for severe pain. Must last 30 days.    Dispense:  60 tablet    Refill:  0    Chronic Pain. (STOP Act - Not applicable). Fill one day early if closed on scheduled refill date.    Follow-up plan:   Return in about 11 weeks (around 01/29/2020) for Medication Management, in person.     Status post diagnostic bilateral L3, L4, L5, S1 facet medial branch nerve blocks, status post right L3, L4, L5, S1 RFA on 05/05/2019; return for the left.      Recent Visits No visits were found meeting these conditions.  Showing recent visits within past 90 days and meeting all other requirements   Today's Visits Date Type Provider Dept  11/13/19 Telemedicine Gillis Santa, MD [facility]  Showing today's visits and meeting all other requirements   Future Appointments No visits were found meeting these conditions.  Showing future appointments within next 90 days and meeting all other requirements   I discussed the assessment and treatment plan with the patient. The patient was provided an opportunity to ask questions and all were answered. The patient agreed with the plan and demonstrated an understanding of the instructions.  Patient advised to call back or seek an in-person evaluation if the symptoms or condition worsens.  Duration of encounter: 25 minutes.  Note by: Gillis Santa, MD Date: 11/13/2019; Time: 11:04 AM

## 2019-11-25 ENCOUNTER — Other Ambulatory Visit: Payer: Self-pay

## 2019-11-25 MED ORDER — CONTOUR NEXT TEST VI STRP: 1.0000 | ORAL_STRIP | Freq: Every day | 1 refills | Status: DC

## 2019-11-29 ENCOUNTER — Other Ambulatory Visit: Payer: Self-pay | Admitting: Adult Health

## 2019-11-29 DIAGNOSIS — J4520 Mild intermittent asthma, uncomplicated: Secondary | ICD-10-CM

## 2019-12-19 ENCOUNTER — Encounter: Payer: Self-pay | Admitting: Nurse Practitioner

## 2019-12-19 ENCOUNTER — Other Ambulatory Visit: Payer: Self-pay

## 2019-12-19 ENCOUNTER — Ambulatory Visit: Payer: No Typology Code available for payment source | Admitting: Nurse Practitioner

## 2019-12-19 VITALS — BP 136/67 | HR 72 | Temp 97.4°F | Resp 16 | Ht 69.0 in | Wt 329.0 lb

## 2019-12-19 DIAGNOSIS — E1165 Type 2 diabetes mellitus with hyperglycemia: Secondary | ICD-10-CM

## 2019-12-19 DIAGNOSIS — I10 Essential (primary) hypertension: Secondary | ICD-10-CM | POA: Diagnosis not present

## 2019-12-19 DIAGNOSIS — N3281 Overactive bladder: Secondary | ICD-10-CM | POA: Diagnosis not present

## 2019-12-19 DIAGNOSIS — F331 Major depressive disorder, recurrent, moderate: Secondary | ICD-10-CM

## 2019-12-19 LAB — POCT GLYCOSYLATED HEMOGLOBIN (HGB A1C): Hemoglobin A1C: 6.6 % — AB (ref 4.0–5.6)

## 2019-12-19 MED ORDER — DESVENLAFAXINE SUCCINATE ER 100 MG PO TB24: 100.0000 mg | ORAL_TABLET | Freq: Every day | ORAL | 1 refills | Status: DC

## 2019-12-19 MED ORDER — METFORMIN HCL 500 MG PO TABS: 500.0000 mg | ORAL_TABLET | Freq: Two times a day (BID) | ORAL | 1 refills | Status: DC

## 2019-12-19 MED ORDER — OXYBUTYNIN CHLORIDE ER 10 MG PO TB24: 10.0000 mg | ORAL_TABLET | Freq: Every day | ORAL | 3 refills | Status: DC

## 2019-12-19 MED ORDER — ENALAPRIL MALEATE 20 MG PO TABS: 20.0000 mg | ORAL_TABLET | Freq: Two times a day (BID) | ORAL | 3 refills | Status: DC

## 2019-12-19 MED ORDER — HYDROCHLOROTHIAZIDE 25 MG PO TABS: 25.0000 mg | ORAL_TABLET | Freq: Every day | ORAL | 3 refills | Status: DC

## 2019-12-19 NOTE — Progress Notes (Signed)
[facility] [place], [place] [address]  Internal MEDICINE  Office Visit Note  Patient Name: Candace Clayton  [id number]  [id number]  Date of Service: 12/28/2019  Chief Complaint  Patient presents with  . Follow-up    For DM  . Diabetes  . Hypertension  . Anxiety  . Depression  . Quality Metric Gaps    foot exam , eye exam    The patient is here for routine follow up of diabetes. Her last HgbA1c was checked in 08/2019 and was 10.1. she was started on metformin twice daily. She has tolerated this addition well. Blood sugars are running in the low to mid 100s. She has made some dietary changes, limiting her intake of sugar and carbohydrates. She has lost nearly 20 pounds since she was last seen in April. Her HgbA1c is 6.6 today, down from 10.1 at last check.  She does continue to have problems with her urine. States that standing up from seated position causes her to leak urine. She states that this can be so bad, she will have to change clothing in the middle of work day. Initially, thought this may be due to hyperglycemia, but now that blood sugars are well controlled, overactive bladder is suspected.  Blood pressure is well managed. She will be due to have routine fasting labs at her next visit.       Current Medication: Outpatient Encounter Medications as of 12/19/2019  Medication Sig  . albuterol (VENTOLIN HFA) 108 (90 Base) MCG/ACT inhaler TAKE 2 PUFFS BY MOUTH EVERY 6 HOURS AS NEEDED FOR WHEEZE OR SHORTNESS OF BREATH  . Blood Glucose Monitoring Suppl (CONTOUR NEXT MONITOR) w/Device KIT 1 each by Does not apply route daily. DX-E11.65  . busPIRone (BUSPAR) 15 MG tablet Take 1 tablet (15 mg total) by mouth 3 (three) times daily. Take one tab po bid for one week, then increase to tid for anxiety  . cetirizine (ZYRTEC) 10 MG tablet TAKE 1 TABLET(S) BY MOUTH DAILY FOR ALLERGIES  . desvenlafaxine (PRISTIQ) 100 MG 24 hr tablet Take 1 tablet (100 mg total) by  mouth daily.  . enalapril (VASOTEC) 20 MG tablet Take 1 tablet (20 mg total) by mouth 2 (two) times daily.  Marland Kitchen glucose blood (CONTOUR NEXT TEST) test strip 1 each by Other route daily. DX E11.65  . hydrochlorothiazide (HYDRODIURIL) 25 MG tablet Take 1 tablet (25 mg total) by mouth daily.  . hydrOXYzine (ATARAX/VISTARIL) 10 MG tablet Take 1 tablet (10 mg total) by mouth 3 (three) times daily as needed.  . metFORMIN (GLUCOPHAGE) 500 MG tablet Take 1 tablet (500 mg total) by mouth 2 (two) times daily with a meal.  . Microlet Lancets MISC 1 each by Does not apply route daily. DX-E11.65  . Multiple Vitamins-Calcium (ONE-A-DAY WOMENS PO) Take 1 tablet by mouth daily.  . NON FORMULARY cpap device  . OVER THE COUNTER MEDICATION 1 tablet daily as needed. Takes generic stool softener for constipation d/t oxycodone.  Marland Kitchen oxyCODONE-acetaminophen (PERCOCET) 5-325 MG tablet Take 1 tablet by mouth 2 (two) times daily as needed for severe pain. Must last 30 days.  Derrill Memo ON 01/05/2020] oxyCODONE-acetaminophen (PERCOCET) 5-325 MG tablet Take 1 tablet by mouth 2 (two) times daily as needed for severe pain. Must last 30 days.  . pregabalin (LYRICA) 50 MG capsule 50 mg during day, 100 mg qhs  . SYMBICORT 160-4.5 MCG/ACT inhaler TAKE 2 PUFFS BY MOUTH TWICE A DAY  . [DISCONTINUED] desvenlafaxine (PRISTIQ) 100  MG 24 hr tablet Take 1 tablet (100 mg total) by mouth daily.  . [DISCONTINUED] enalapril (VASOTEC) 20 MG tablet Take 1 tablet (20 mg total) by mouth 2 (two) times daily.  . [DISCONTINUED] hydrochlorothiazide (HYDRODIURIL) 25 MG tablet Take 1 tablet (25 mg total) by mouth daily.  . [DISCONTINUED] metFORMIN (GLUCOPHAGE) 500 MG tablet Take 1 tablet (500 mg total) by mouth 2 (two) times daily with a meal.  . oxybutynin (DITROPAN-XL) 10 MG 24 hr tablet Take 1 tablet (10 mg total) by mouth at bedtime.   No facility-administered encounter medications on file as of 12/19/2019.    Surgical History: Past Surgical History:   Procedure Laterality Date  . CESAREAN SECTION  1992  . COLONOSCOPY     polyps removed first procedure. 2nd time all was clear  . COLONOSCOPY WITH PROPOFOL N/A 03/14/2019   Procedure: COLONOSCOPY WITH PROPOFOL;  Surgeon: Lin Landsman, MD;  Location: [facility]COPY;  Service: Gastroenterology;  Laterality: N/A;  . FINGER SURGERY Left 2011   left finger cut off x 2.(only up to last digit)  . HEMATOMA EVACUATION Left 1999   upper part of foot was injured d/t 500lb weight landing on her foot.   Marland Kitchen LAPAROSCOPIC SALPINGO OOPHERECTOMY Left 04/02/2018   Procedure: LAPAROSCOPIC SALPINGO OOPHORECTOMY;  Surgeon: Malachy Mood, MD;  Location: [facility] ORS;  Service: Gynecology;  Laterality: Left;    Medical History: Past Medical History:  Diagnosis Date  . Anginal pain (HCC)    tightness related to anxiety  . Anxiety   . Depression   . GERD (gastroesophageal reflux disease)    throws up easily but not diagnosed with reflux  . History of C-section 40  . Hypertension   . Sleep apnea    uses cpap    Family History: Family History  Problem Relation Age of Onset  . Breast cancer Mother 40       x 3 times  . Colon cancer Mother   . Brain cancer Mother   . Cancer - Colon Mother   . Cancer - Other Mother   . Heart Problems Father   . Colon cancer Brother   . Heart Problems Brother     Social History   Socioeconomic History  . Marital status: Divorced    Spouse name: Not on file  . Number of children: 1  . Years of education: Not on file  . Highest education level: Not on file  Occupational History  . Occupation: works in Secretary/administrator  Tobacco Use  . Smoking status: Never Smoker  . Smokeless tobacco: Never Used  Vaping Use  . Vaping Use: Never used  Substance and Sexual Activity  . Alcohol use: No  . Drug use: No  . Sexual activity: Not Currently    Birth control/protection: Post-menopausal  Other Topics Concern  . Not on file  Social History Narrative   Son  has schizophrenia and autism but is fully capable of helping mother after surgery   Social Determinants of Health   Financial Resource Strain:   . Difficulty of Paying Living Expenses:   Food Insecurity:   . Worried About Charity fundraiser in the Last Year:   . Arboriculturist in the Last Year:   Transportation Needs:   . Film/video editor (Medical):   Marland Kitchen Lack of Transportation (Non-Medical):   Physical Activity:   . Days of Exercise per Week:   . Minutes of Exercise per Session:   Stress:   .  Feeling of Stress :   Social Connections:   . Frequency of Communication with Friends and Family:   . Frequency of Social Gatherings with Friends and Family:   . Attends Religious Services:   . Active Member of Clubs or Organizations:   . Attends Archivist Meetings:   Marland Kitchen Marital Status:   Intimate Partner Violence:   . Fear of Current or Ex-Partner:   . Emotionally Abused:   Marland Kitchen Physically Abused:   . Sexually Abused:       Review of Systems  Constitutional: Positive for fatigue. Negative for activity change, chills and unexpected weight change.       Weight loss of nearly 20 pounds since being diagnosed with diabetes in 08/2019  HENT: Negative for congestion, postnasal drip, rhinorrhea, sneezing and sore throat.   Respiratory: Negative for cough, chest tightness and shortness of breath.   Cardiovascular: Negative for chest pain and palpitations.  Gastrointestinal: Negative for abdominal pain, constipation, diarrhea, nausea and vomiting.  Endocrine: Negative for cold intolerance, heat intolerance, polydipsia and polyuria.       Blood sugars are much improved since initial diagnosis of diabetes.   Genitourinary: Positive for frequency and urgency. Negative for dysuria and flank pain.  Musculoskeletal: Positive for back pain. Negative for arthralgias, joint swelling and neck pain.  Skin: Negative for rash.  Allergic/Immunologic: Negative for environmental allergies.   Neurological: Negative for dizziness, tremors, numbness and headaches.  Hematological: Negative for adenopathy. Does not bruise/bleed easily.  Psychiatric/Behavioral: Positive for dysphoric mood. Negative for behavioral problems (Depression), sleep disturbance and suicidal ideas. The patient is nervous/anxious.     Today's Vitals   12/19/19 1340  BP: 136/67  Pulse: 72  Resp: 16  Temp: (!) 97.4 F (36.3 C)  SpO2: 99%  Weight: (!) 329 lb (149.2 kg)  Height: _0  (1.753 m)   Body mass index is 48.58 kg/m.  Physical Exam Vitals and nursing note reviewed.  Constitutional:      General: She is not in acute distress.    Appearance: Normal appearance. She is well-developed. She is obese. She is not diaphoretic.  HENT:     Head: Normocephalic and atraumatic.     Mouth/Throat:     Pharynx: No oropharyngeal exudate.  Eyes:     Pupils: Pupils are equal, round, and reactive to light.  Neck:     Thyroid: No thyromegaly.     Vascular: No JVD.     Trachea: No tracheal deviation.  Cardiovascular:     Rate and Rhythm: Normal rate and regular rhythm.     Heart sounds: Normal heart sounds. No murmur heard.  No friction rub. No gallop.   Pulmonary:     Effort: Pulmonary effort is normal. No respiratory distress.     Breath sounds: Normal breath sounds. No wheezing or rales.  Chest:     Chest wall: No tenderness.  Abdominal:     Palpations: Abdomen is soft.  Genitourinary:    Comments: Urine positive for glucose. Musculoskeletal:        General: Normal range of motion.     Cervical back: Normal range of motion and neck supple.  Lymphadenopathy:     Cervical: No cervical adenopathy.  Skin:    General: Skin is warm and dry.  Neurological:     Mental Status: She is alert and oriented to person, place, and time.     Cranial Nerves: No cranial nerve deficit.  Psychiatric:  Attention and Perception: Attention and perception normal.        Mood and Affect: Affect normal. Mood  is anxious.        Speech: Speech normal.        Behavior: Behavior normal. Behavior is cooperative.        Thought Content: Thought content normal.        Cognition and Memory: Cognition and memory normal.        Judgment: Judgment normal.    Assessment/Plan: 1. Type 2 diabetes mellitus with hyperglycemia, without long-term current use of insulin (HCC) - POCT HgB A1C 6.6 today, down from 10.1 when initially diagnosed with diabetes. Continue metformin 524m twice daily. Monitor blood sugars closely.  - metFORMIN (GLUCOPHAGE) 500 MG tablet; Take 1 tablet (500 mg total) by mouth 2 (two) times daily with a meal.  Dispense: 180 tablet; Refill: 1  2. Overactive bladder Trial oxybutynin XL 185mdaily.  - oxybutynin (DITROPAN-XL) 10 MG 24 hr tablet; Take 1 tablet (10 mg total) by mouth at bedtime.  Dispense: 30 tablet; Refill: 3  3. Essential hypertension Stable. Continue bp medication as prescribed.  - enalapril (VASOTEC) 20 MG tablet; Take 1 tablet (20 mg total) by mouth 2 (two) times daily.  Dispense: 180 tablet; Refill: 3 - hydrochlorothiazide (HYDRODIURIL) 25 MG tablet; Take 1 tablet (25 mg total) by mouth daily.  Dispense: 90 tablet; Refill: 3  4. Major depressive disorder, recurrent episode, moderate (HCC) Stable. Continue pristiq as prescribed . - desvenlafaxine (PRISTIQ) 100 MG 24 hr tablet; Take 1 tablet (100 mg total) by mouth daily.  Dispense: 90 tablet; Refill: 1  General Counseling: Delaynee verbalizes understanding of the findings of todays visit and agrees with plan of treatment. I have discussed any further diagnostic evaluation that may be needed or ordered today. We also reviewed her medications today. she has been encouraged to call the office with any questions or concerns that should arise related to todays visit.  Diabetes Counseling:  1. Addition of ACE inh/ ARB'S for nephroprotection. Microalbumin is updated  2. Diabetic foot care, prevention of complications. Podiatry  consult 3. Exercise and lose weight.  4. Diabetic eye examination, Diabetic eye exam is updated  5. Monitor blood sugar closlely. nutrition counseling.  6. Sign and symptoms of hypoglycemia including shaking sweating,confusion and headaches.  This patient was seen by HeLeretha PolNP Collaboration with Dr FoLavera Guises a part of collaborative care agreement  Orders Placed This Encounter  Procedures  . POCT HgB A1C    Meds ordered this encounter  Medications  . oxybutynin (DITROPAN-XL) 10 MG 24 hr tablet    Sig: Take 1 tablet (10 mg total) by mouth at bedtime.    Dispense:  30 tablet    Refill:  3    Order Specific Question:   Supervising Provider    Answer:   KHLavera Guise1[[id number]]. enalapril (VASOTEC) 20 MG tablet    Sig: Take 1 tablet (20 mg total) by mouth 2 (two) times daily.    Dispense:  180 tablet    Refill:  3    Please note, patient is taking twice daily. I have updated the number of tablets to reflect this change. Thanks.    Order Specific Question:   Supervising Provider    Answer:   KHLavera Guise1[[id number]]. desvenlafaxine (PRISTIQ) 100 MG 24 hr tablet    Sig: Take 1 tablet (100 mg total) by mouth daily.  Dispense:  90 tablet    Refill:  1    Order Specific Question:   Supervising Provider    Answer:   Lavera Guise [[id number]]  . hydrochlorothiazide (HYDRODIURIL) 25 MG tablet    Sig: Take 1 tablet (25 mg total) by mouth daily.    Dispense:  90 tablet    Refill:  3    Order Specific Question:   Supervising Provider    Answer:   Lavera Guise [[id number]]  . metFORMIN (GLUCOPHAGE) 500 MG tablet    Sig: Take 1 tablet (500 mg total) by mouth 2 (two) times daily with a meal.    Dispense:  180 tablet    Refill:  1    Order Specific Question:   Supervising Provider    Answer:   Lavera Guise [[id number]]    Total time spent: 30 Minutes Time spent includes review of chart, medications, test results, and follow up plan with the patient.      Dr Lavera Guise Internal  medicine

## 2019-12-24 ENCOUNTER — Telehealth: Payer: Self-pay

## 2019-12-24 DIAGNOSIS — S2232XA Fracture of one rib, left side, initial encounter for closed fracture: Secondary | ICD-10-CM | POA: Diagnosis not present

## 2019-12-24 DIAGNOSIS — E669 Obesity, unspecified: Secondary | ICD-10-CM | POA: Diagnosis not present

## 2019-12-24 DIAGNOSIS — I10 Essential (primary) hypertension: Secondary | ICD-10-CM | POA: Diagnosis not present

## 2019-12-24 DIAGNOSIS — R0781 Pleurodynia: Secondary | ICD-10-CM | POA: Diagnosis not present

## 2019-12-24 DIAGNOSIS — R162 Hepatomegaly with splenomegaly, not elsewhere classified: Secondary | ICD-10-CM | POA: Diagnosis not present

## 2019-12-24 DIAGNOSIS — W01198A Fall on same level from slipping, tripping and stumbling with subsequent striking against other object, initial encounter: Secondary | ICD-10-CM | POA: Diagnosis not present

## 2019-12-24 DIAGNOSIS — S2242XA Multiple fractures of ribs, left side, initial encounter for closed fracture: Secondary | ICD-10-CM | POA: Diagnosis not present

## 2019-12-24 DIAGNOSIS — K8020 Calculus of gallbladder without cholecystitis without obstruction: Secondary | ICD-10-CM | POA: Diagnosis not present

## 2019-12-24 DIAGNOSIS — R1084 Generalized abdominal pain: Secondary | ICD-10-CM | POA: Diagnosis not present

## 2019-12-24 DIAGNOSIS — S3993XA Unspecified injury of pelvis, initial encounter: Secondary | ICD-10-CM | POA: Diagnosis not present

## 2019-12-24 NOTE — Telephone Encounter (Signed)
I informed pharmacist ok to fill script for opioid for acute situation, as pt has fx rib.  Pharmacist has already called ED physician, the new script for Oxycodone was cancelled.

## 2019-12-24 NOTE — Telephone Encounter (Signed)
Instead of [facility], her listed pharmacy,  she dropped off a script from the ER for oxycodone 5mg  10 tablets at [facility].  She just got a 30 day supply at the [facility] from here. It red flagged when they pulled it up and want to know if she has permission to fill it.  The number is [phone]

## 2019-12-28 DIAGNOSIS — N3281 Overactive bladder: Secondary | ICD-10-CM | POA: Insufficient documentation

## 2019-12-30 ENCOUNTER — Telehealth: Payer: Self-pay | Admitting: Student in an Organized Health Care Education/Training Program

## 2019-12-30 NOTE — Telephone Encounter (Signed)
Spoke with [facility] and informed them that she could get the script filled for an acute injury.  Patient notified by voicemail.

## 2019-12-30 NOTE — Telephone Encounter (Signed)
Pt left voicemail stating that she fell and broke 3 ribs and has ran out of medication. She wants to know if Dr Holley Raring will send more in. She also states that the ED gave her an rx for 10 pain pills but the pharmacy refused to fill them due to the contract with [place].

## 2020-01-07 ENCOUNTER — Ambulatory Visit: Payer: BC Managed Care – PPO | Admitting: Adult Health

## 2020-01-09 DIAGNOSIS — Z9181 History of falling: Secondary | ICD-10-CM | POA: Diagnosis not present

## 2020-01-09 DIAGNOSIS — M4854XA Collapsed vertebra, not elsewhere classified, thoracic region, initial encounter for fracture: Secondary | ICD-10-CM | POA: Diagnosis not present

## 2020-01-09 DIAGNOSIS — S129XXA Fracture of neck, unspecified, initial encounter: Secondary | ICD-10-CM | POA: Diagnosis not present

## 2020-01-09 DIAGNOSIS — M4314 Spondylolisthesis, thoracic region: Secondary | ICD-10-CM | POA: Diagnosis not present

## 2020-01-09 DIAGNOSIS — M5030 Other cervical disc degeneration, unspecified cervical region: Secondary | ICD-10-CM | POA: Diagnosis not present

## 2020-01-09 DIAGNOSIS — F329 Major depressive disorder, single episode, unspecified: Secondary | ICD-10-CM | POA: Diagnosis not present

## 2020-01-09 DIAGNOSIS — R601 Generalized edema: Secondary | ICD-10-CM | POA: Diagnosis not present

## 2020-01-09 DIAGNOSIS — Z79899 Other long term (current) drug therapy: Secondary | ICD-10-CM | POA: Diagnosis not present

## 2020-01-09 DIAGNOSIS — E119 Type 2 diabetes mellitus without complications: Secondary | ICD-10-CM | POA: Diagnosis not present

## 2020-01-09 DIAGNOSIS — J811 Chronic pulmonary edema: Secondary | ICD-10-CM | POA: Diagnosis not present

## 2020-01-09 DIAGNOSIS — I499 Cardiac arrhythmia, unspecified: Secondary | ICD-10-CM | POA: Diagnosis not present

## 2020-01-09 DIAGNOSIS — Z794 Long term (current) use of insulin: Secondary | ICD-10-CM | POA: Diagnosis not present

## 2020-01-09 DIAGNOSIS — M47816 Spondylosis without myelopathy or radiculopathy, lumbar region: Secondary | ICD-10-CM | POA: Diagnosis not present

## 2020-01-09 DIAGNOSIS — E669 Obesity, unspecified: Secondary | ICD-10-CM | POA: Diagnosis not present

## 2020-01-09 DIAGNOSIS — M47812 Spondylosis without myelopathy or radiculopathy, cervical region: Secondary | ICD-10-CM | POA: Diagnosis not present

## 2020-01-09 DIAGNOSIS — R0602 Shortness of breath: Secondary | ICD-10-CM | POA: Diagnosis not present

## 2020-01-09 DIAGNOSIS — M4316 Spondylolisthesis, lumbar region: Secondary | ICD-10-CM | POA: Diagnosis not present

## 2020-01-09 DIAGNOSIS — J90 Pleural effusion, not elsewhere classified: Secondary | ICD-10-CM | POA: Diagnosis not present

## 2020-01-09 DIAGNOSIS — I119 Hypertensive heart disease without heart failure: Secondary | ICD-10-CM | POA: Diagnosis not present

## 2020-01-09 DIAGNOSIS — R791 Abnormal coagulation profile: Secondary | ICD-10-CM | POA: Diagnosis not present

## 2020-01-09 DIAGNOSIS — R918 Other nonspecific abnormal finding of lung field: Secondary | ICD-10-CM | POA: Diagnosis not present

## 2020-01-09 DIAGNOSIS — E8770 Fluid overload, unspecified: Secondary | ICD-10-CM | POA: Diagnosis not present

## 2020-01-14 ENCOUNTER — Telehealth: Payer: Self-pay

## 2020-01-14 NOTE — Telephone Encounter (Signed)
Confirmed and screened for 01-16-20 ov.

## 2020-01-16 ENCOUNTER — Encounter: Payer: Self-pay | Admitting: Nurse Practitioner

## 2020-01-16 ENCOUNTER — Ambulatory Visit: Payer: No Typology Code available for payment source | Admitting: Nurse Practitioner

## 2020-01-16 ENCOUNTER — Ambulatory Visit
Admission: RE | Admit: 2020-01-16 | Discharge: 2020-01-16 | Disposition: A | Discharge status: Home / Self Care | Payer: BC Managed Care – PPO | Source: Ambulatory Visit | Attending: Nurse Practitioner | Admitting: Nurse Practitioner

## 2020-01-16 ENCOUNTER — Other Ambulatory Visit: Payer: Self-pay

## 2020-01-16 VITALS — BP 199/108 | HR 70 | Temp 97.4°F | Resp 16 | Ht 69.0 in | Wt 341.8 lb

## 2020-01-16 DIAGNOSIS — R0602 Shortness of breath: Secondary | ICD-10-CM

## 2020-01-16 DIAGNOSIS — I10 Essential (primary) hypertension: Secondary | ICD-10-CM | POA: Diagnosis not present

## 2020-01-16 DIAGNOSIS — K8020 Calculus of gallbladder without cholecystitis without obstruction: Secondary | ICD-10-CM | POA: Diagnosis not present

## 2020-01-16 DIAGNOSIS — S2241XD Multiple fractures of ribs, right side, subsequent encounter for fracture with routine healing: Secondary | ICD-10-CM

## 2020-01-16 DIAGNOSIS — J9811 Atelectasis: Secondary | ICD-10-CM | POA: Diagnosis not present

## 2020-01-16 MED ORDER — METOPROLOL TARTRATE 25 MG PO TABS: 25.0000 mg | ORAL_TABLET | Freq: Two times a day (BID) | ORAL | 3 refills | Status: DC

## 2020-01-16 NOTE — Progress Notes (Signed)
[facility] [place], [place] [address]  Internal MEDICINE  Office Visit Note  Patient Name: Candace Clayton  [id number]  [id number]  Date of Service: 02/11/2020  Chief Complaint  Patient presents with  . Hospitalization Follow-up    Broken Ribs - Having a hard time breathing, but O2 is normal   . Depression  . Gastroesophageal Reflux  . Hypertension  . Quality Metric Gaps    TDAP     shortness of breath. recent fall with multiple fractured ribs. having trouble breathing which has been worsening every since.she was seen in the ER at [facility] on 12/24/2019 and 01/09/2020. CT of the chest done on 6/30 did show nondisplaced right rib fractures of 5th through 7th ribs. Possible pulmonary hypertension was also noted. CT abdomen pelvis was positive for cholelithiasis witout acute cholecystitis. When symptoms worsened, she went back to the ER. Right rib fractures were again noted. There was also mild anterolithesis present at T8 level which was not present on exams done 12/24/2019. CT of the thoracic and lumbar spine showed some wedging at T12 level which is considered chronic without any acute fractures noted. CT of the cervical spine was negative. A new chest CTA was performed which was still showing pulmonary hypertension and small, bilateral pulmonary effusions. Symptoms have only worsened. She has been unable to work as breathing is difficult. She does see pain management, so no further pain medication can be given. She states that difficulty breathing is much worse than controlling the pain. She does continue to have moderate right sided chest discomfort in general area of multiple rib fractures. Her blood pressure is very elevated at visit today.       Current Medication: Outpatient Encounter Medications as of 01/16/2020  Medication Sig  . albuterol (VENTOLIN HFA) 108 (90 Base) MCG/ACT inhaler TAKE 2 PUFFS BY MOUTH EVERY 6 HOURS AS NEEDED FOR WHEEZE OR SHORTNESS OF BREATH   . Blood Glucose Monitoring Suppl (CONTOUR NEXT MONITOR) w/Device KIT 1 each by Does not apply route daily. DX-E11.65  . busPIRone (BUSPAR) 15 MG tablet Take 1 tablet (15 mg total) by mouth 3 (three) times daily. Take one tab po bid for one week, then increase to tid for anxiety  . desvenlafaxine (PRISTIQ) 100 MG 24 hr tablet Take 1 tablet (100 mg total) by mouth daily.  . enalapril (VASOTEC) 20 MG tablet Take 1 tablet (20 mg total) by mouth 2 (two) times daily.  . hydrochlorothiazide (HYDRODIURIL) 25 MG tablet Take 1 tablet (25 mg total) by mouth daily.  . hydrOXYzine (ATARAX/VISTARIL) 10 MG tablet Take 1 tablet (10 mg total) by mouth 3 (three) times daily as needed.  . metFORMIN (GLUCOPHAGE) 500 MG tablet Take 1 tablet (500 mg total) by mouth 2 (two) times daily with a meal.  . Microlet Lancets MISC 1 each by Does not apply route daily. DX-E11.65  . Multiple Vitamins-Calcium (ONE-A-DAY WOMENS PO) Take 1 tablet by mouth daily.  . NON FORMULARY cpap device  . OVER THE COUNTER MEDICATION 1 tablet daily as needed. Takes generic stool softener for constipation d/t oxycodone.  Marland Kitchen oxybutynin (DITROPAN-XL) 10 MG 24 hr tablet Take 1 tablet (10 mg total) by mouth at bedtime.  . [DISCONTINUED] cetirizine (ZYRTEC) 10 MG tablet TAKE 1 TABLET(S) BY MOUTH DAILY FOR ALLERGIES  . [DISCONTINUED] glucose blood (CONTOUR NEXT TEST) test strip 1 each by Other route daily. DX E11.65  . [DISCONTINUED] oxyCODONE-acetaminophen (PERCOCET) 5-325 MG tablet Take 1 tablet by mouth 2 (  two) times daily as needed for severe pain. Must last 30 days.  . [DISCONTINUED] pregabalin (LYRICA) 50 MG capsule 50 mg during day, 100 mg qhs  . [DISCONTINUED] SYMBICORT 160-4.5 MCG/ACT inhaler TAKE 2 PUFFS BY MOUTH TWICE A DAY  . [DISCONTINUED] metoprolol tartrate (LOPRESSOR) 25 MG tablet Take 1 tablet (25 mg total) by mouth 2 (two) times daily.   No facility-administered encounter medications on file as of 01/16/2020.    Surgical  History: Past Surgical History:  Procedure Laterality Date  . CESAREAN SECTION  1992  . COLONOSCOPY     polyps removed first procedure. 2nd time all was clear  . COLONOSCOPY WITH PROPOFOL N/A 03/14/2019   Procedure: COLONOSCOPY WITH PROPOFOL;  Surgeon: Lin Landsman, MD;  Location: [facility] ENDOSCOPY;  Service: Gastroenterology;  Laterality: N/A;  . FINGER SURGERY Left 2011   left finger cut off x 2.(only up to last digit)  . HEMATOMA EVACUATION Left 1999   upper part of foot was injured d/t 500lb weight landing on her foot.   Marland Kitchen LAPAROSCOPIC SALPINGO OOPHERECTOMY Left 04/02/2018   Procedure: LAPAROSCOPIC SALPINGO OOPHORECTOMY;  Surgeon: Malachy Mood, MD;  Location: [facility] ORS;  Service: Gynecology;  Laterality: Left;    Medical History: Past Medical History:  Diagnosis Date  . Anginal pain (HCC)    tightness related to anxiety  . Anxiety   . Depression   . Diabetes mellitus without complication ([place])   . GERD (gastroesophageal reflux disease)    throws up easily but not diagnosed with reflux  . History of C-section 60  . Hypertension   . Sleep apnea    uses cpap    Family History: Family History  Problem Relation Age of Onset  . Breast cancer Mother 40       x 3 times  . Colon cancer Mother   . Brain cancer Mother   . Cancer - Colon Mother   . Cancer - Other Mother   . Heart Problems Father   . Colon cancer Brother   . Heart Problems Brother     Social History   Socioeconomic History  . Marital status: Divorced    Spouse name: Not on file  . Number of children: 1  . Years of education: Not on file  . Highest education level: Not on file  Occupational History  . Occupation: works in Secretary/administrator  Tobacco Use  . Smoking status: Never Smoker  . Smokeless tobacco: Never Used  Vaping Use  . Vaping Use: Never used  Substance and Sexual Activity  . Alcohol use: No  . Drug use: No  . Sexual activity: Not Currently    Birth control/protection:  Post-menopausal  Other Topics Concern  . Not on file  Social History Narrative   Son has schizophrenia and autism but is fully capable of helping mother after surgery   Social Determinants of Health   Financial Resource Strain:   . Difficulty of Paying Living Expenses:   Food Insecurity:   . Worried About Charity fundraiser in the Last Year:   . Arboriculturist in the Last Year:   Transportation Needs:   . Film/video editor (Medical):   Marland Kitchen Lack of Transportation (Non-Medical):   Physical Activity:   . Days of Exercise per Week:   . Minutes of Exercise per Session:   Stress:   . Feeling of Stress :   Social Connections:   . Frequency of Communication with Friends and Family:   .  Frequency of Social Gatherings with Friends and Family:   . Attends Religious Services:   . Active Member of Clubs or Organizations:   . Attends Archivist Meetings:   Marland Kitchen Marital Status:   Intimate Partner Violence:   . Fear of Current or Ex-Partner:   . Emotionally Abused:   Marland Kitchen Physically Abused:   . Sexually Abused:       Review of Systems  Constitutional: Positive for activity change and fatigue. Negative for chills and unexpected weight change.  HENT: Negative for congestion, postnasal drip, rhinorrhea, sneezing and sore throat.   Eyes: Negative for redness.  Respiratory: Positive for chest tightness and shortness of breath. Negative for cough.   Cardiovascular: Positive for chest pain. Negative for palpitations.       Chest pain musculoskeletal in nature. Blood pressure very elevated today.   Gastrointestinal: Negative for abdominal pain, constipation, diarrhea, nausea and vomiting.  Genitourinary: Positive for frequency and urgency.  Musculoskeletal: Positive for arthralgias and back pain. Negative for joint swelling and neck pain.  Skin: Negative for rash.  Neurological: Negative for dizziness, tremors, numbness and headaches.  Hematological: Negative for adenopathy. Does  not bruise/bleed easily.  Psychiatric/Behavioral: Negative for behavioral problems (Depression), sleep disturbance and suicidal ideas. The patient is not nervous/anxious.     Today's Vitals   01/16/20 1044  BP: (!) 199/108  Pulse: 70  Resp: 16  Temp: (!) 97.4 F (36.3 C)  SpO2: 98%  Weight: (!) 341 lb 12.8 oz (155 kg)  Height: 5' 9"  (1.753 m)   Body mass index is 50.48 kg/m.  Physical Exam Vitals and nursing note reviewed.  Constitutional:      General: She is in acute distress.     Appearance: She is well-developed. She is obese. She is not diaphoretic.  HENT:     Head: Normocephalic and atraumatic.     Mouth/Throat:     Pharynx: No oropharyngeal exudate.  Eyes:     Pupils: Pupils are equal, round, and reactive to light.  Neck:     Thyroid: No thyromegaly.     Vascular: No JVD.     Trachea: No tracheal deviation.  Cardiovascular:     Rate and Rhythm: Normal rate and regular rhythm.     Heart sounds: Normal heart sounds. No murmur heard.  No friction rub. No gallop.   Pulmonary:     Effort: Pulmonary effort is normal. No respiratory distress.     Breath sounds: Normal breath sounds. No wheezing or rales.     Comments: There is considerable tenderness when palpating the right side of the chest, just under the breast.  Chest:     Chest wall: Tenderness present.  Abdominal:     Palpations: Abdomen is soft.  Musculoskeletal:        General: Normal range of motion.     Cervical back: Normal range of motion and neck supple.  Lymphadenopathy:     Cervical: No cervical adenopathy.  Skin:    General: Skin is warm and dry.  Neurological:     Mental Status: She is alert and oriented to person, place, and time.     Cranial Nerves: No cranial nerve deficit.  Psychiatric:        Attention and Perception: Attention and perception normal.        Mood and Affect: Affect normal. Mood is anxious.        Speech: Speech normal.        Behavior: Behavior normal. Behavior  is  cooperative.        Thought Content: Thought content normal.        Cognition and Memory: Cognition and memory normal.        Judgment: Judgment normal.   Assessment/Plan: 1. Shortness of breath Persistent and worsening since fall leading to multiple rib fractures. Will repeat chest x-ray and get echocardiogram for further evaluation.  - ECHOCARDIOGRAM COMPLETE; Future - DG Chest 2 View; Future  2. Closed fracture of multiple ribs of right side with routine healing, subsequent encounter Reviewed chest CT done in ER on 6/30 and 7/17 showing closed, nondisplaced fractures of 5th through 7th ribs. Also showing small, bilateral pleural effusions. Will repeat chest x-ray for further evaluation.   3. Calculus of gallbladder without cholecystitis without obstruction CT abdomen done in ER 12/24/2019 showing incidental finding of cholelithiasis without cholecystitis. Will monitor.   4. Essential hypertension Start metoprolol 49m twice daily. Monitor closely.   General Counseling: Shiesha verbalizes understanding of the findings of todays visit and agrees with plan of treatment. I have discussed any further diagnostic evaluation that may be needed or ordered today. We also reviewed her medications today. she has been encouraged to call the office with any questions or concerns that should arise related to todays visit.  This patient was seen by HLeretha PolFNP Collaboration with Dr FLavera Guiseas a part of collaborative care agreement  Orders Placed This Encounter  Procedures  . DG Chest 2 View  . ECHOCARDIOGRAM COMPLETE    Meds ordered this encounter  Medications  . DISCONTD: metoprolol tartrate (LOPRESSOR) 25 MG tablet    Sig: Take 1 tablet (25 mg total) by mouth 2 (two) times daily.    Dispense:  60 tablet    Refill:  3    Order Specific Question:   Supervising Provider    Answer:   KLavera Guise[[[id number]   Total time spent: 431Minutes   Time spent includes review of chart,  medications, test results, and follow up plan with the patient.      Dr FLavera GuiseInternal medicine

## 2020-01-21 ENCOUNTER — Telehealth: Payer: Self-pay

## 2020-01-21 NOTE — Telephone Encounter (Signed)
Lmom to confirm and screen for 01-23-20 ov.

## 2020-01-22 ENCOUNTER — Telehealth: Payer: Self-pay

## 2020-01-22 NOTE — Telephone Encounter (Signed)
Confirmed appointment on 01/23/2020 and screened for covid. klh

## 2020-01-23 ENCOUNTER — Encounter: Payer: Self-pay | Admitting: Nurse Practitioner

## 2020-01-23 ENCOUNTER — Other Ambulatory Visit: Payer: Self-pay

## 2020-01-23 ENCOUNTER — Ambulatory Visit: Payer: No Typology Code available for payment source | Admitting: Nurse Practitioner

## 2020-01-23 VITALS — BP 177/90 | HR 74 | Temp 97.8°F | Resp 16 | Ht 69.0 in | Wt 331.4 lb

## 2020-01-23 DIAGNOSIS — I10 Essential (primary) hypertension: Secondary | ICD-10-CM | POA: Diagnosis not present

## 2020-01-23 DIAGNOSIS — R0602 Shortness of breath: Secondary | ICD-10-CM | POA: Diagnosis not present

## 2020-01-23 DIAGNOSIS — J189 Pneumonia, unspecified organism: Secondary | ICD-10-CM | POA: Diagnosis not present

## 2020-01-23 DIAGNOSIS — S2241XD Multiple fractures of ribs, right side, subsequent encounter for fracture with routine healing: Secondary | ICD-10-CM

## 2020-01-23 MED ORDER — METOPROLOL TARTRATE 25 MG PO TABS: 50.0000 mg | ORAL_TABLET | Freq: Two times a day (BID) | ORAL | 1 refills | Status: DC

## 2020-01-23 MED ORDER — AMOXICILLIN-POT CLAVULANATE 875-125 MG PO TABS: 1.0000 | ORAL_TABLET | Freq: Two times a day (BID) | ORAL | 0 refills | Status: DC

## 2020-01-23 NOTE — Progress Notes (Signed)
[facility] [place], [place] [address]  Internal MEDICINE  Office Visit Note  Patient Name: Candace Clayton  [id number]  [id number]  Date of Service: 02/18/2020  Chief Complaint  Patient presents with  . Follow-up    Review x-ray   . Breathing Problem    Feels SOB when going outside    The patient is here for follow up visit. She has been out of work since she had a fall with multiple fractured ribs. having trouble breathing which has been worsening every since.she was seen in the ER at [facility] on 12/24/2019 and 01/09/2020. CT of the chest done on 6/30 did show nondisplaced right rib fractures of 5th through 7th ribs. Possible pulmonary hypertension was also noted. CT abdomen pelvis was positive for cholelithiasis witout acute cholecystitis. When symptoms worsened, she went back to the ER. Right rib fractures were again noted. There was also mild anterolithesis present at T8 level which was not present on exams done 12/24/2019. CT of the thoracic and lumbar spine showed some wedging at T12 level which is considered chronic without any acute fractures noted. CT of the cervical spine was negative. A new chest CTA was performed which was still showing pulmonary hypertension and small, bilateral pulmonary effusions. Symptoms have only worsened. She has been unable to work as breathing is difficult. She does see pain management, so no further pain medication can be given. A follow up chest x-ray was done on 01/17/2020. There results show 1. Bandlike basilar opacities likely reflect subsegmental atelectasis possibly related to splinting from minimally displaced posterior left ninth and tenth and lateral right eighth and ninth rib fractures. 2. Central vascular congestion and diffuse hazy interstitial opacities likely reflecting some superimposed edematous changes. 3. Chronic hyperinflation. She states that she continues to have moderate shortness of breath. Blood pressure is moderately  elevated, but improved from previous visit.       Current Medication: Outpatient Encounter Medications as of 01/23/2020  Medication Sig  . albuterol (VENTOLIN HFA) 108 (90 Base) MCG/ACT inhaler TAKE 2 PUFFS BY MOUTH EVERY 6 HOURS AS NEEDED FOR WHEEZE OR SHORTNESS OF BREATH  . Blood Glucose Monitoring Suppl (CONTOUR NEXT MONITOR) w/Device KIT 1 each by Does not apply route daily. DX-E11.65  . busPIRone (BUSPAR) 15 MG tablet Take 1 tablet (15 mg total) by mouth 3 (three) times daily. Take one tab po bid for one week, then increase to tid for anxiety  . desvenlafaxine (PRISTIQ) 100 MG 24 hr tablet Take 1 tablet (100 mg total) by mouth daily.  . enalapril (VASOTEC) 20 MG tablet Take 1 tablet (20 mg total) by mouth 2 (two) times daily.  . hydrochlorothiazide (HYDRODIURIL) 25 MG tablet Take 1 tablet (25 mg total) by mouth daily.  . hydrOXYzine (ATARAX/VISTARIL) 10 MG tablet Take 1 tablet (10 mg total) by mouth 3 (three) times daily as needed.  . metFORMIN (GLUCOPHAGE) 500 MG tablet Take 1 tablet (500 mg total) by mouth 2 (two) times daily with a meal.  . metoprolol tartrate (LOPRESSOR) 25 MG tablet Take 2 tablets (50 mg total) by mouth 2 (two) times daily.  . Microlet Lancets MISC 1 each by Does not apply route daily. DX-E11.65  . Multiple Vitamins-Calcium (ONE-A-DAY WOMENS PO) Take 1 tablet by mouth daily.  . NON FORMULARY cpap device  . OVER THE COUNTER MEDICATION 1 tablet daily as needed. Takes generic stool softener for constipation d/t oxycodone.  Marland Kitchen oxybutynin (DITROPAN-XL) 10 MG 24 hr tablet Take 1  tablet (10 mg total) by mouth at bedtime.  . [DISCONTINUED] cetirizine (ZYRTEC) 10 MG tablet TAKE 1 TABLET(S) BY MOUTH DAILY FOR ALLERGIES  . [DISCONTINUED] glucose blood (CONTOUR NEXT TEST) test strip 1 each by Other route daily. DX E11.65  . [DISCONTINUED] metoprolol tartrate (LOPRESSOR) 25 MG tablet Take 1 tablet (25 mg total) by mouth 2 (two) times daily.  . [DISCONTINUED]  oxyCODONE-acetaminophen (PERCOCET) 5-325 MG tablet Take 1 tablet by mouth 2 (two) times daily as needed for severe pain. Must last 30 days.  . [DISCONTINUED] pregabalin (LYRICA) 50 MG capsule 50 mg during day, 100 mg qhs  . [DISCONTINUED] SYMBICORT 160-4.5 MCG/ACT inhaler TAKE 2 PUFFS BY MOUTH TWICE A DAY  . [DISCONTINUED] amoxicillin-clavulanate (AUGMENTIN) 875-125 MG tablet Take 1 tablet by mouth 2 (two) times daily.   No facility-administered encounter medications on file as of 01/23/2020.    Surgical History: Past Surgical History:  Procedure Laterality Date  . CESAREAN SECTION  1992  . COLONOSCOPY     polyps removed first procedure. 2nd time all was clear  . COLONOSCOPY WITH PROPOFOL N/A 03/14/2019   Procedure: COLONOSCOPY WITH PROPOFOL;  Surgeon: Lin Landsman, MD;  Location: [facility] ENDOSCOPY;  Service: Gastroenterology;  Laterality: N/A;  . FINGER SURGERY Left 2011   left finger cut off x 2.(only up to last digit)  . HEMATOMA EVACUATION Left 1999   upper part of foot was injured d/t 500lb weight landing on her foot.   Marland Kitchen LAPAROSCOPIC SALPINGO OOPHERECTOMY Left 04/02/2018   Procedure: LAPAROSCOPIC SALPINGO OOPHORECTOMY;  Surgeon: Malachy Mood, MD;  Location: [facility] ORS;  Service: Gynecology;  Laterality: Left;    Medical History: Past Medical History:  Diagnosis Date  . Anginal pain (HCC)    tightness related to anxiety  . Anxiety   . Depression   . Diabetes mellitus without complication ([place])   . GERD (gastroesophageal reflux disease)    throws up easily but not diagnosed with reflux  . History of C-section 48  . Hypertension   . Sleep apnea    uses cpap    Family History: Family History  Problem Relation Age of Onset  . Breast cancer Mother 40       x 3 times  . Colon cancer Mother   . Brain cancer Mother   . Cancer - Colon Mother   . Cancer - Other Mother   . Heart Problems Father   . Colon cancer Brother   . Heart Problems Brother     Social  History   Socioeconomic History  . Marital status: Divorced    Spouse name: Not on file  . Number of children: 1  . Years of education: Not on file  . Highest education level: Not on file  Occupational History  . Occupation: works in Secretary/administrator  Tobacco Use  . Smoking status: Never Smoker  . Smokeless tobacco: Never Used  Vaping Use  . Vaping Use: Never used  Substance and Sexual Activity  . Alcohol use: No  . Drug use: No  . Sexual activity: Not Currently    Birth control/protection: Post-menopausal  Other Topics Concern  . Not on file  Social History Narrative   Son has schizophrenia and autism but is fully capable of helping mother after surgery   Social Determinants of Health   Financial Resource Strain:   . Difficulty of Paying Living Expenses: Not on file  Food Insecurity:   . Worried About Charity fundraiser in the Last Year: Not  on file  . Ran Out of Food in the Last Year: Not on file  Transportation Needs:   . Lack of Transportation (Medical): Not on file  . Lack of Transportation (Non-Medical): Not on file  Physical Activity:   . Days of Exercise per Week: Not on file  . Minutes of Exercise per Session: Not on file  Stress:   . Feeling of Stress : Not on file  Social Connections:   . Frequency of Communication with Friends and Family: Not on file  . Frequency of Social Gatherings with Friends and Family: Not on file  . Attends Religious Services: Not on file  . Active Member of Clubs or Organizations: Not on file  . Attends Archivist Meetings: Not on file  . Marital Status: Not on file  Intimate Partner Violence:   . Fear of Current or Ex-Partner: Not on file  . Emotionally Abused: Not on file  . Physically Abused: Not on file  . Sexually Abused: Not on file      Review of Systems  Constitutional: Positive for activity change and fatigue. Negative for chills and unexpected weight change.  HENT: Negative for congestion, postnasal  drip, rhinorrhea, sneezing and sore throat.   Eyes: Negative for redness.  Respiratory: Positive for chest tightness and shortness of breath. Negative for cough.   Cardiovascular: Positive for chest pain. Negative for palpitations.       Chest pain musculoskeletal in nature. Blood pressure improved, but still elevated.   Gastrointestinal: Negative for abdominal pain, constipation, diarrhea, nausea and vomiting.  Genitourinary: Positive for frequency and urgency.  Musculoskeletal: Positive for arthralgias and back pain. Negative for joint swelling and neck pain.  Skin: Negative for rash.  Neurological: Negative for dizziness, tremors, numbness and headaches.  Hematological: Negative for adenopathy. Does not bruise/bleed easily.  Psychiatric/Behavioral: Negative for behavioral problems (Depression), sleep disturbance and suicidal ideas. The patient is nervous/anxious.     Today's Vitals   01/23/20 1511  BP: (!) 177/90  Pulse: 74  Resp: 16  Temp: 97.8 F (36.6 C)  SpO2: 96%  Weight: (!) 331 lb 6.4 oz (150.3 kg)  Height: _0  (1.753 m)   Body mass index is 48.94 kg/m.  Physical Exam Vitals and nursing note reviewed.  Constitutional:      General: She is in acute distress.     Appearance: She is well-developed. She is obese. She is not diaphoretic.  HENT:     Head: Normocephalic and atraumatic.     Mouth/Throat:     Pharynx: No oropharyngeal exudate.  Eyes:     Pupils: Pupils are equal, round, and reactive to light.  Neck:     Thyroid: No thyromegaly.     Vascular: No JVD.     Trachea: No tracheal deviation.  Cardiovascular:     Rate and Rhythm: Normal rate and regular rhythm.     Heart sounds: Normal heart sounds. No murmur heard.  No friction rub. No gallop.   Pulmonary:     Effort: Pulmonary effort is normal. No respiratory distress.     Breath sounds: Normal breath sounds. No wheezing or rales.     Comments: There is considerable tenderness when palpating the right  side of the chest, just under the breast.  There is no bruising or swelling noted. No crepitus can be felt at this time.  Chest:     Chest wall: Tenderness present.  Abdominal:     Palpations: Abdomen is soft.  Musculoskeletal:  General: Normal range of motion.     Cervical back: Normal range of motion and neck supple.  Lymphadenopathy:     Cervical: No cervical adenopathy.  Skin:    General: Skin is warm and dry.  Neurological:     Mental Status: She is alert and oriented to person, place, and time.     Cranial Nerves: No cranial nerve deficit.  Psychiatric:        Attention and Perception: Attention and perception normal.        Mood and Affect: Affect normal. Mood is anxious.        Speech: Speech normal.        Behavior: Behavior normal. Behavior is cooperative.        Thought Content: Thought content normal.        Cognition and Memory: Cognition and memory normal.        Judgment: Judgment normal.    Assessment/Plan: 1. Closed fracture of multiple ribs of right side with routine healing, subsequent encounter A follow up chest x-ray was done on 01/17/2020. There results show 1. Bandlike basilar opacities likely reflect subsegmental atelectasis possibly related to splinting from minimally displaced posterior left ninth and tenth and lateral right eighth and ninth rib fractures. 2. Central vascular congestion and diffuse hazy interstitial opacities likely reflecting some superimposed edematous changes. 3. Chronic hyperinflation. Will continue to monitor.   2. Pneumonia of both lower lobes due to infectious organism Due to opacities seen on recent chest x-ray, will treat with augmentin twice daily for next 10 days.   3. Shortness of breath Persistent due to multiple rib fractures and possible pneumonia. Tshe is scheduled to have echocardiogram next week.   4. Essential hypertension Increase metoprolol 11m to two tablets po BID. Encouraged her to monitor her blood pressure  closely.  - metoprolol tartrate (LOPRESSOR) 25 MG tablet; Take 2 tablets (50 mg total) by mouth 2 (two) times daily.  Dispense: 120 tablet; Refill: 1  General Counseling: Harbour verbalizes understanding of the findings of todays visit and agrees with plan of treatment. I have discussed any further diagnostic evaluation that may be needed or ordered today. We also reviewed her medications today. she has been encouraged to call the office with any questions or concerns that should arise related to todays visit.   This patient [place]vera Guise[place] a part of collaborative care agreement  Meds ordered this encounter  Medications  . DISCONTD: amoxicillin-clavulanate (AUGMENTIN) 875-125 MG tablet    Sig: Take 1 tablet by mouth 2 (two) times daily.    Dispense:  20 tablet    Refill:  0    Order Specific Question:   Supervising Provider    Answer:   KLavera Guise[[[id number]] . metoprolol tartrate (LOPRESSOR) 25 MG tablet    Sig: Take 2 tablets (50 mg total) by mouth 2 (two) times daily.    Dispense:  120 tablet    Refill:  1    Please note increased dose. Patient will use what she has before needing new prescription.    Order Specific Question:   Supervising Provider    Answer:   KLavera Guise[[[id number]   Total time spent: 30 Minutes   Time spent includes review of chart, medications, test results, and follow up plan with the patient.      Dr FLavera GuiseInternal medicine

## 2020-01-27 ENCOUNTER — Other Ambulatory Visit: Payer: Self-pay

## 2020-01-27 ENCOUNTER — Encounter: Payer: Self-pay | Admitting: Student in an Organized Health Care Education/Training Program

## 2020-01-27 ENCOUNTER — Ambulatory Visit: Payer: BC Managed Care – PPO | Admitting: Nurse Practitioner

## 2020-01-27 ENCOUNTER — Ambulatory Visit
Payer: BC Managed Care – PPO | Attending: Student in an Organized Health Care Education/Training Program | Admitting: Student in an Organized Health Care Education/Training Program

## 2020-01-27 VITALS — BP 168/83 | HR 63 | Temp 97.1°F | Resp 18 | Ht 69.0 in | Wt 331.0 lb

## 2020-01-27 DIAGNOSIS — G588 Other specified mononeuropathies: Secondary | ICD-10-CM | POA: Insufficient documentation

## 2020-01-27 DIAGNOSIS — M5136 Other intervertebral disc degeneration, lumbar region: Secondary | ICD-10-CM | POA: Insufficient documentation

## 2020-01-27 DIAGNOSIS — M47816 Spondylosis without myelopathy or radiculopathy, lumbar region: Secondary | ICD-10-CM

## 2020-01-27 DIAGNOSIS — S2242XA Multiple fractures of ribs, left side, initial encounter for closed fracture: Secondary | ICD-10-CM | POA: Insufficient documentation

## 2020-01-27 DIAGNOSIS — S2242XD Multiple fractures of ribs, left side, subsequent encounter for fracture with routine healing: Secondary | ICD-10-CM | POA: Diagnosis not present

## 2020-01-27 DIAGNOSIS — G894 Chronic pain syndrome: Secondary | ICD-10-CM | POA: Diagnosis not present

## 2020-01-27 MED ORDER — PREGABALIN 50 MG PO CAPS: ORAL_CAPSULE | ORAL | 2 refills | Status: DC

## 2020-01-27 MED ORDER — OXYCODONE-ACETAMINOPHEN 5-325 MG PO TABS: 1.0000 | ORAL_TABLET | Freq: Three times a day (TID) | ORAL | 0 refills | Status: DC | PRN

## 2020-01-27 NOTE — Progress Notes (Signed)
Nursing Pain Medication Assessment:  Safety precautions to be maintained throughout the outpatient stay will include: orient to surroundings, keep bed in low position, maintain call bell within reach at all times, provide assistance with transfer out of bed and ambulation.  Medication Inspection Compliance: Ms. Mordan did not comply with our request to bring her pills to be counted. She was reminded that bringing the medication bottles, even when empty, is a requirement.  Medication: None brought in. Pill/Patch Count: None available to be counted. Bottle Appearance: No container available. Did not bring bottle(s) to appointment. Filled Date: N/A Last Medication intake:  Today  

## 2020-01-27 NOTE — Progress Notes (Signed)
PROVIDER NOTE: Information contained herein reflects review and annotations entered in association with encounter. Interpretation of such information and data should be left to medically-trained personnel. Information provided to patient can be located elsewhere in the medical record under "Patient Instructions". Document created using STT-dictation technology, any transcriptional errors that may result from process are unintentional.    Patient: Candace Clayton  Service Category: E/M  Provider: Gillis Santa, MD  DOB: 03-15-1961  DOS: 01/27/2020  Specialty: Interventional Pain Management  MRN: [id number]  Setting: Ambulatory outpatient  PCP: Ronnell Freshwater, NP  Type: Established Patient    Referring Provider: Ronnell Freshwater, NP  Location: Office  Delivery: Face-to-face     HPI  Reason for encounter: [name], a 59 y.o. year old female, is here today for evaluation and management of her Lumbar facet arthropathy [M47.816]. [name] primary complain today is Back Pain (lower, right) Last encounter: Practice (12/30/2019). My last encounter with her was on 12/30/2019. Pertinent problems: [name] has Major depressive disorder, recurrent episode, moderate (Candace Clayton); Morbid obesity ([place]); Low back pain with sciatica; OSA on CPAP; Lumbar degenerative disc disease; Lumbar facet arthropathy (R>L); Lumbar spondylosis; Chronic pain syndrome; and Type 2 diabetes mellitus with hyperglycemia, without long-term current use of insulin (HCC) on their pertinent problem list. Pain Assessment: Severity of Chronic pain is reported as a 3 /10. Location: Back Right, Lower/numbness in right leg. Onset: More than a month ago. Quality: Burning, Shooting. Timing: Constant. Modifying factor(s): Oxycodone, rest. Vitals:  height is 5' 9"  (1.753 m) and weight is 331 lb (150.1 kg) (abnormal). Her temporal temperature is 97.1 F (36.2 C) (abnormal). Her blood pressure is 168/83 (abnormal) and her pulse is 63. Her  respiration is 18 and oxygen saturation is 99%.   Patient presents today for medication management. Of note she had a visit to the emergency department on 12/24/2019 after she tripped over a curb and fell on the sidewalk landed on her side. CT chest showed nondisplaced fractures of left anterior ribs 5-7. Patient was given instructions to utilize incentive spirometer.  She states that her pain has improved over the last month although she does have intermittent episodes especially with deep inspiration that cause pleuritic pain.  She is currently being treated for pneumonia.  She has continued to work in the context of her rib fractures.  We discussed intercostal nerve block for her left anterior rib pain.  Risks and benefits reviewed and patient states that she will consider this if she does not continue to improve.  We discussed increasing her oxycodone to 5 mg 3 times daily as needed (from 5 mg twice daily as needed) in the context of her rib fractures.  Pharmacotherapy Assessment   01/10/2020  1   11/04/2019  Pregabalin 50 MG Capsule  90.00  30 Bi Lat   [id number]   [place] (0921)   2/2  1.00 LME  Comm Ins   [place]  01/05/2020  1   11/13/2019  Oxycodone-Acetaminophen 5-325  60.00  30 Bi Lat   [id number]   [place] (0921)   0/0  15.00 MME  Comm Ins   [place]     Analgesic: Monitoring: [place] PMP: PDMP reviewed during this encounter.       Pharmacotherapy: No side-effects or adverse reactions reported. Compliance: No problems identified. Effectiveness: Clinically acceptable.  Landis Martins, RN  01/27/2020  2:51 PM  Sign when Signing Visit Nursing Pain Medication Assessment:  Safety precautions to be maintained throughout the  outpatient stay will include: orient to surroundings, keep bed in low position, maintain call bell within reach at all times, provide assistance with transfer out of bed and ambulation.  Medication Inspection Compliance: [name] did not comply with our request to bring her pills to be counted.  She was reminded that bringing the medication bottles, even when empty, is a requirement.  Medication: None brought in. Pill/Patch Count: None available to be counted. Bottle Appearance: No container available. Did not bring bottle(s) to appointment. Filled Date: N/A Last Medication intake:  Today    UDS:  Summary  Date Value Ref Range Status  08/26/2019 Note  Final    Comment:    ==================================================================== [employer] Select 13 (MW) ==================================================================== Test                             Result       Flag       Units Drug Present and Declared for Prescription Verification   Oxycodone                      139          EXPECTED   ng/mg creat   Oxymorphone                    688          EXPECTED   ng/mg creat   Noroxycodone                   473          EXPECTED   ng/mg creat   Noroxymorphone                 163          EXPECTED   ng/mg creat    Sources of oxycodone are scheduled prescription medications.    Oxymorphone, noroxycodone, and noroxymorphone are expected    metabolites of oxycodone. Oxymorphone is also available as a    scheduled prescription medication. ==================================================================== Test                      Result    Flag   Units      Ref Range   Creatinine              64               mg/dL      >=20 ==================================================================== Declared Medications:  The flagging and interpretation on this report are based on the  following declared medications.  Unexpected results may arise from  inaccuracies in the declared medications.  **Note: The testing scope of this panel includes these medications:  Oxycodone  **Note: The testing scope of this panel does not include the  following reported medications:  Acetaminophen  Albuterol  Budesonide (Symbicort)  Buspirone  Cetirizine  Desvenlafaxine (Pristiq)   Docusate (Stool Softener)  Enalapril  Formoterol (Symbicort)  Hydrochlorothiazide  Hydroxyzine  Multivitamin  Pregabalin  Sulfamethoxazole (Bactrim)  Trimethoprim (Bactrim) ==================================================================== For clinical consultation, please call [phone]. ====================================================================      ROS  Constitutional: Denies any fever or chills Gastrointestinal: No reported hemesis, hematochezia, vomiting, or acute GI distress Musculoskeletal: Positive low back pain, left subcostal pain Neurological: No reported episodes of acute onset apraxia, aphasia, dysarthria, agnosia, amnesia, paralysis, loss of coordination, or loss of consciousness  Medication Review  Contour Next Monitor, Microlet  Lancets, Multiple Vitamins-Minerals, NON FORMULARY, OVER THE COUNTER MEDICATION, albuterol, amoxicillin-clavulanate, budesonide-formoterol, busPIRone, cetirizine, desvenlafaxine, enalapril, glucose blood, hydrOXYzine, hydrochlorothiazide, metFORMIN, metoprolol tartrate, oxyCODONE-acetaminophen, oxybutynin, and pregabalin  History Review  Allergy: [name] is allergic to gabapentin. Drug: [name]  reports no history of drug use. Alcohol:  reports no history of alcohol use. Tobacco:  reports that she has never smoked. She has never used smokeless tobacco. Social: [name]  reports that she has never smoked. She has never used smokeless tobacco. She reports that she does not drink alcohol and does not use drugs. Medical:  has a past medical history of Anginal pain ([place]), Anxiety, Depression, GERD (gastroesophageal reflux disease), History of C-section (1992), Hypertension, and Sleep apnea. Surgical: [name]  has a past surgical history that includes Cesarean section (1992); Finger surgery (Left, 2011); Hematoma evacuation (Left, 1999); Colonoscopy; Laparoscopic salpingo oophorectomy (Left, 04/02/2018); and Colonoscopy  with propofol (N/A, 03/14/2019). Family: family history includes Brain cancer in her mother; Breast cancer (age of onset: 36) in her mother; Cancer - Colon in her mother; Cancer - Other in her mother; Colon cancer in her brother and mother; Heart Problems in her brother and father.  Laboratory Chemistry Profile   Renal Lab Results  Component Value Date   BUN 12 03/06/2019   CREATININE 0.80 03/06/2019   BCR 15 03/06/2019   GFRAA 94 03/06/2019   GFRNONAA 82 03/06/2019     Hepatic Lab Results  Component Value Date   AST 26 03/06/2019   ALT 30 03/06/2019   ALBUMIN 3.9 03/06/2019   ALKPHOS 92 03/06/2019     Electrolytes Lab Results  Component Value Date   NA 139 03/06/2019   K 4.9 03/06/2019   CL 105 03/06/2019   CALCIUM 9.1 03/06/2019     Bone Lab Results  Component Value Date   VD25OH 17.2 (L) 03/06/2019     Inflammation (CRP: Acute Phase) (ESR: Chronic Phase) No results found for: CRP, ESRSEDRATE, LATICACIDVEN     Note: Above Lab results reviewed.  Recent Imaging Review  DG Chest 2 View CLINICAL DATA:  Shortness of breath, fall multiple rib fractures  EXAM: CHEST - 2 VIEW  COMPARISON:  Radiograph 06/07/2007  FINDINGS: Hyperinflation with flattening of the diaphragms more diffuse hazy interstitial opacities and central vascular congestion which could reflect some superimposed edematous changes. Some bandlike basilar opacities likely reflect subsegmental atelectasis possibly related to splinting from the posterior left ninth and tenth and lateral right eighth and ninth rib fractures. No other acute osseous abnormality is seen. Prominence of the cardiomediastinal silhouette is likely accentuated by low volumes and portable technique. The aorta is calcified. The remaining cardiomediastinal contours are unremarkable. No other acute osseous abnormality. Degenerative changes in the shoulders and spine.  IMPRESSION: 1. Bandlike basilar opacities likely reflect  subsegmental atelectasis possibly related to splinting from minimally displaced posterior left ninth and tenth and lateral right eighth and ninth rib fractures. 2. Central vascular congestion and diffuse hazy interstitial opacities likely reflecting some superimposed edematous changes. 3. Chronic hyperinflation.  Electronically Signed   By: Lovena Le M.D.   On: 01/17/2020 21:37 Note: Reviewed        Physical Exam  General appearance: Well nourished, well developed, and well hydrated. In no apparent acute distress Mental status: Alert, oriented x 3 (person, place, & time)       Respiratory: No evidence of acute respiratory distress Eyes: PERLA Vitals: BP (!) 168/83   Pulse 63   Temp (!) 97.1 F (  36.2 C) (Temporal)   Resp 18   Ht 5' 9"  (1.753 m)   Wt (!) 331 lb (150.1 kg)   SpO2 99%   BMI 48.88 kg/m  BMI: Estimated body mass index is 48.88 kg/m as calculated from the following:   Height as of this encounter: 5' 9"  (1.753 m).   Weight as of this encounter: 331 lb (150.1 kg). Ideal: Ideal body weight: 66.2 kg (145 lb 15.1 oz) Adjusted ideal body weight: 99.8 kg (219 lb 15.5 oz)  Thoracic Spine Area Exam  Skin & Axial Inspection: No masses, redness, or swelling Alignment: Symmetrical Functional ROM: Pain restricted ROM Stability: No instability detected Muscle Tone/Strength: Functionally intact. No obvious neuro-muscular anomalies detected. Sensory (Neurological): Neuropathic pain pattern   Left subcostal pain  Lumbar Spine Area Exam  Skin & Axial Inspection: No masses, redness, or swelling Alignment: Symmetrical Functional ROM: Restricted ROM       Stability: No instability detected Muscle Tone/Strength: Functionally intact. No obvious neuro-muscular anomalies detected. Sensory (Neurological): Musculoskeletal pain pattern  Provocative Tests: Hyperextension/rotation test: Positive bilaterally for facet joint pain. Lumbar quadrant test (Kemp's test): deferred today        Lateral bending test: deferred today       Patrick's Maneuver: deferred today                   FABER* test: deferred today                   S-I anterior distraction/compression test: deferred today         S-I lateral compression test: deferred today         S-I Thigh-thrust test: deferred today         S-I Gaenslen's test: deferred today         *(Flexion, ABduction and External Rotation) Gait & Posture Assessment  Ambulation: Unassisted Gait: Relatively normal for age and body habitus Posture: WNL  Lower Extremity Exam    Side: Right lower extremity  Side: Left lower extremity  Stability: No instability observed          Stability: No instability observed          Skin & Extremity Inspection: Skin color, temperature, and hair growth are WNL. No peripheral edema or cyanosis. No masses, redness, swelling, asymmetry, or associated skin lesions. No contractures.  Skin & Extremity Inspection: Skin color, temperature, and hair growth are WNL. No peripheral edema or cyanosis. No masses, redness, swelling, asymmetry, or associated skin lesions. No contractures.  Functional ROM: Unrestricted ROM                  Functional ROM: Unrestricted ROM                  Muscle Tone/Strength: Functionally intact. No obvious neuro-muscular anomalies detected.  Muscle Tone/Strength: Functionally intact. No obvious neuro-muscular anomalies detected.  Sensory (Neurological): Unimpaired        Sensory (Neurological): Unimpaired        DTR: Patellar: deferred today Achilles: deferred today Plantar: deferred today  DTR: Patellar: deferred today Achilles: deferred today Plantar: deferred today  Palpation: No palpable anomalies  Palpation: No palpable anomalies    Assessment   Status Diagnosis  Persistent Persistent Persistent 1. Lumbar facet arthropathy (R>L)   2. Chronic pain syndrome   3. Lumbar spondylosis   4. Lumbar degenerative disc disease   5. 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 has a current medication list which includes the following long-term medication(s): albuterol, cetirizine, desvenlafaxine, enalapril, hydrochlorothiazide, metformin, metoprolol tartrate, pregabalin, and symbicort.  Pharmacotherapy (Medications Ordered): Meds ordered this encounter  Medications  . oxyCODONE-acetaminophen (PERCOCET) 5-325 MG tablet    Sig: Take 1 tablet by mouth every 8 (eight) hours as needed for severe pain. Must last 30 days.    Dispense:  90 tablet    Refill:  0    Chronic Pain. (STOP Act - Not applicable). Fill one day early if closed on scheduled refill date.  Marland Kitchen oxyCODONE-acetaminophen (PERCOCET) 5-325 MG tablet    Sig: Take 1 tablet by mouth every 8 (eight) hours as needed for severe pain. Must last 30 days.    Dispense:  90 tablet    Refill:  0    Chronic Pain. (STOP Act - Not applicable). Fill one day early if closed on scheduled refill date.  . pregabalin (LYRICA) 50 MG capsule    Sig: 50 mg during day, 100 mg qhs    Dispense:  90 capsule    Refill:  2   Orders:  Orders Placed This Encounter  Procedures  . INTERCOSTAL NERVE BLOCK    Standing Status:   Standing    Number of Occurrences:   1    Standing Expiration Date:   07/29/2020    Scheduling Instructions:     Side: LEFT     Sedation: With Sedation.     TIMEFRAME: PRN procedure. ([name] will call when  needed.)    Order Specific Question:   Where will this procedure be performed?    Answer:   [facility]  . LUMBAR FACET(MEDIAL BRANCH NERVE BLOCK) MBNB    Scheduling timeframe: (PRN procedure) [name] will call when needed. Clinical indication: Axial low back pain. Lumbosacral Spondylosis (M47.897).  Sedation: Usually done with sedation. (May be done without sedation if so desired by patient.) Requirements: NPO x 8 hrs.; Driver; Stop blood thinners. Interval: No sooner than two weeks for diagnostic or therapeutic. No sooner than every other month for palliative.    Standing Status:   Standing    Number of Occurrences:   5    Standing Expiration Date:   07/29/2020    Scheduling Instructions:     Procedure: Lumbar facet block (AKA.: Lumbosacral medial branch nerve block)     Level: L3-4, L4-5, Facets (L2, L3, L4, L5, Medial Branch Nerves)     Laterality: Bilateral    Order Specific Question:   Where will this procedure be performed?    Answer:   [facility]   Follow-up plan:   Return in about 8 weeks (around 03/23/2020) for Medication Management, in person.     Status post diagnostic bilateral L3, L4, L5, S1 facet medial branch nerve blocks, status post right L3, L4, L5, S1 RFA on 05/05/2019; return for the left as needed.  As needed intercostal nerve block  for left rib fractures       Recent Visits Date Type Provider Dept  11/13/19 Telemedicine Gillis Santa, MD [facility]  Showing recent visits within past 90 days and meeting all other requirements Today's Visits Date Type Provider Dept  01/27/20 Office Visit Gillis Santa, MD [facility]  Showing today's visits and meeting all other requirements Future Appointments Date Type Provider Dept  03/25/20 Appointment Gillis Santa, MD [facility]  Showing future appointments within next 90 days and meeting all other requirements  I discussed the assessment and treatment plan with the  patient. The patient was provided an opportunity to ask questions and all were answered. The patient agreed with the plan and demonstrated an understanding of the instructions.  Patient advised to call back or seek an in-person evaluation if the symptoms or condition worsens.  Duration of encounter: 30 minutes.  Note by: Gillis Santa, MD Date: 01/27/2020; Time: 3:37 PM

## 2020-01-27 NOTE — Patient Instructions (Signed)
Two prescriptions for Oxycodone have been sent to your pharmacy.

## 2020-01-28 ENCOUNTER — Telehealth: Payer: Self-pay

## 2020-01-28 NOTE — Telephone Encounter (Signed)
Confirmed and screened for office visit 8/6

## 2020-01-30 ENCOUNTER — Other Ambulatory Visit: Payer: Self-pay

## 2020-01-30 ENCOUNTER — Ambulatory Visit: Payer: No Typology Code available for payment source

## 2020-01-30 DIAGNOSIS — R0602 Shortness of breath: Secondary | ICD-10-CM | POA: Diagnosis not present

## 2020-02-02 ENCOUNTER — Other Ambulatory Visit: Payer: Self-pay

## 2020-02-02 MED ORDER — CONTOUR NEXT TEST VI STRP: 1.0000 | ORAL_STRIP | Freq: Every day | 1 refills | Status: DC

## 2020-02-03 ENCOUNTER — Telehealth: Payer: Self-pay

## 2020-02-03 NOTE — Telephone Encounter (Signed)
Lmom to confirm and screen for 02-06-20 ov.

## 2020-02-06 ENCOUNTER — Ambulatory Visit: Payer: No Typology Code available for payment source | Admitting: Nurse Practitioner

## 2020-02-06 ENCOUNTER — Encounter: Payer: Self-pay | Admitting: Nurse Practitioner

## 2020-02-06 ENCOUNTER — Ambulatory Visit
Admission: RE | Admit: 2020-02-06 | Discharge: 2020-02-06 | Disposition: A | Discharge status: Home / Self Care | Payer: BC Managed Care – PPO | Source: Ambulatory Visit | Attending: Nurse Practitioner | Admitting: Nurse Practitioner

## 2020-02-06 ENCOUNTER — Other Ambulatory Visit: Payer: Self-pay

## 2020-02-06 VITALS — BP 162/81 | HR 59 | Temp 97.4°F | Resp 16 | Ht 69.0 in | Wt 337.4 lb

## 2020-02-06 DIAGNOSIS — S2241XA Multiple fractures of ribs, right side, initial encounter for closed fracture: Secondary | ICD-10-CM | POA: Diagnosis not present

## 2020-02-06 DIAGNOSIS — R0602 Shortness of breath: Secondary | ICD-10-CM | POA: Insufficient documentation

## 2020-02-06 DIAGNOSIS — S2241XG Multiple fractures of ribs, right side, subsequent encounter for fracture with delayed healing: Secondary | ICD-10-CM | POA: Insufficient documentation

## 2020-02-06 DIAGNOSIS — R0782 Intercostal pain: Secondary | ICD-10-CM | POA: Diagnosis not present

## 2020-02-06 DIAGNOSIS — J4520 Mild intermittent asthma, uncomplicated: Secondary | ICD-10-CM | POA: Diagnosis not present

## 2020-02-06 DIAGNOSIS — J90 Pleural effusion, not elsewhere classified: Secondary | ICD-10-CM | POA: Diagnosis not present

## 2020-02-06 MED ORDER — ANORO ELLIPTA 62.5-25 MCG/INH IN AEPB: 1.0000 | INHALATION_SPRAY | Freq: Every day | RESPIRATORY_TRACT | 5 refills | Status: DC

## 2020-02-06 NOTE — Progress Notes (Signed)
[facility] [place], [place] [address]  Internal MEDICINE  Office Visit Note  Patient Name: Candace Clayton  [id number]  [id number]  Date of Service: 02/06/2020  Chief Complaint  Patient presents with   Follow-up    review results, breathing is still not right   Depression   Hypertension   Sleep Apnea   Quality Metric Gaps    HIV screening, TDAP    Patient here for follow up visit. She continues to have shortness of breath and right sided chest pain since falling and fracturing three of the ribs on the right side of her chest. She had echocardiogram since her last visit. There is normal LVEF with trace mitral and tricuspid regurgitation. She was also treated with augmentin in case pneumonia had set in. Unsure if this made much of a difference. Will repeat chest x-ray for further evaluation.  She does have history of asthma/copd. Was prescribed symbicort, two inhalations twice daily. She walso was prescribed rescue inhaler. She states that she never started the symbicort, as the price, after insurance was over $150 for a 30 day prescription.       Current Medication: Outpatient Encounter Medications as of 02/06/2020  Medication Sig   albuterol (VENTOLIN HFA) 108 (90 Base) MCG/ACT inhaler TAKE 2 PUFFS BY MOUTH EVERY 6 HOURS AS NEEDED FOR WHEEZE OR SHORTNESS OF BREATH   amoxicillin-clavulanate (AUGMENTIN) 875-125 MG tablet Take 1 tablet by mouth 2 (two) times daily.   Blood Glucose Monitoring Suppl (CONTOUR NEXT MONITOR) w/Device KIT 1 each by Does not apply route daily. DX-E11.65   busPIRone (BUSPAR) 15 MG tablet Take 1 tablet (15 mg total) by mouth 3 (three) times daily. Take one tab po bid for one week, then increase to tid for anxiety   cetirizine (ZYRTEC) 10 MG tablet TAKE 1 TABLET(S) BY MOUTH DAILY FOR ALLERGIES   desvenlafaxine (PRISTIQ) 100 MG 24 hr tablet Take 1 tablet (100 mg total) by mouth daily.   enalapril (VASOTEC) 20 MG tablet Take  1 tablet (20 mg total) by mouth 2 (two) times daily.   glucose blood (CONTOUR NEXT TEST) test strip 1 each by Other route daily. DX E11.65   hydrochlorothiazide (HYDRODIURIL) 25 MG tablet Take 1 tablet (25 mg total) by mouth daily.   hydrOXYzine (ATARAX/VISTARIL) 10 MG tablet Take 1 tablet (10 mg total) by mouth 3 (three) times daily as needed.   metFORMIN (GLUCOPHAGE) 500 MG tablet Take 1 tablet (500 mg total) by mouth 2 (two) times daily with a meal.   metoprolol tartrate (LOPRESSOR) 25 MG tablet Take 2 tablets (50 mg total) by mouth 2 (two) times daily.   Microlet Lancets MISC 1 each by Does not apply route daily. DX-E11.65   Multiple Vitamins-Calcium (ONE-A-DAY WOMENS PO) Take 1 tablet by mouth daily.   NON FORMULARY cpap device   OVER THE COUNTER MEDICATION 1 tablet daily as needed. Takes generic stool softener for constipation d/t oxycodone.   oxybutynin (DITROPAN-XL) 10 MG 24 hr tablet Take 1 tablet (10 mg total) by mouth at bedtime.   oxyCODONE-acetaminophen (PERCOCET) 5-325 MG tablet Take 1 tablet by mouth every 8 (eight) hours as needed for severe pain. Must last 30 days.   [START ON 02/26/2020] oxyCODONE-acetaminophen (PERCOCET) 5-325 MG tablet Take 1 tablet by mouth every 8 (eight) hours as needed for severe pain. Must last 30 days.   pregabalin (LYRICA) 50 MG capsule 50 mg during day, 100 mg qhs   SYMBICORT 160-4.5 MCG/ACT inhaler TAKE  2 PUFFS BY MOUTH TWICE A DAY   No facility-administered encounter medications on file as of 02/06/2020.    Surgical History: Past Surgical History:  Procedure Laterality Date   CESAREAN SECTION  1992   COLONOSCOPY     polyps removed first procedure. 2nd time all was clear   COLONOSCOPY WITH PROPOFOL N/A 03/14/2019   Procedure: COLONOSCOPY WITH PROPOFOL;  Surgeon: Lin Landsman, MD;  Location: [facility]COPY;  Service: Gastroenterology;  Laterality: N/A;   FINGER SURGERY Left 2011   left finger cut off x 2.(only up to last  digit)   HEMATOMA EVACUATION Left 1999   upper part of foot was injured d/t 500lb weight landing on her foot.    LAPAROSCOPIC SALPINGO OOPHERECTOMY Left 04/02/2018   Procedure: LAPAROSCOPIC SALPINGO OOPHORECTOMY;  Surgeon: Malachy Mood, MD;  Location: [facility] ORS;  Service: Gynecology;  Laterality: Left;    Medical History: Past Medical History:  Diagnosis Date   Anginal pain (HCC)    tightness related to anxiety   Anxiety    Depression    Diabetes mellitus without complication (HCC)    GERD (gastroesophageal reflux disease)    throws up easily but not diagnosed with reflux   History of C-section 1992   Hypertension    Sleep apnea    uses cpap    Family History: Family History  Problem Relation Age of Onset   Breast cancer Mother 55       x 3 times   Colon cancer Mother    Brain cancer Mother    Cancer - Colon Mother    Cancer - Other Mother    Heart Problems Father    Colon cancer Brother    Heart Problems Brother     Social History   Socioeconomic History   Marital status: Divorced    Spouse name: Not on file   Number of children: 1   Years of education: Not on file   Highest education level: Not on file  Occupational History   Occupation: works in Secretary/administrator  Tobacco Use   Smoking status: Never Smoker   Smokeless tobacco: Never Used  Scientific laboratory technician Use: Never used  Substance and Sexual Activity   Alcohol use: No   Drug use: No   Sexual activity: Not Currently    Birth control/protection: Post-menopausal  Other Topics Concern   Not on file  Social History Narrative   Son has schizophrenia and autism but is fully capable of helping mother after surgery   Social Determinants of Health   Financial Resource Strain:    Difficulty of Paying Living Expenses:   Food Insecurity:    Worried About Charity fundraiser in the Last Year:    Arboriculturist in the Last Year:   Transportation Needs:    Lexicographer (Medical):    Lack of Transportation (Non-Medical):   Physical Activity:    Days of Exercise per Week:    Minutes of Exercise per Session:   Stress:    Feeling of Stress :   Social Connections:    Frequency of Communication with Friends and Family:    Frequency of Social Gatherings with Friends and Family:    Attends Religious Services:    Active Member of Clubs or Organizations:    Attends Archivist Meetings:    Marital Status:   Intimate Partner Violence:    Fear of Current or Ex-Partner:    Emotionally Abused:  Physically Abused:    Sexually Abused:       Review of Systems  Constitutional: Positive for activity change and fatigue. Negative for chills and unexpected weight change.  HENT: Negative for congestion, postnasal drip, rhinorrhea, sneezing and sore throat.   Respiratory: Positive for chest tightness and shortness of breath. Negative for cough.        Slightly improved since last visit.   Cardiovascular: Positive for chest pain. Negative for palpitations.       Chest pain musculoskeletal in nature. Blood pressure improved, but still elevated.   Gastrointestinal: Negative for abdominal pain, constipation, diarrhea, nausea and vomiting.  Genitourinary: Positive for frequency and urgency.  Musculoskeletal: Positive for arthralgias, back pain and myalgias. Negative for joint swelling and neck pain.  Skin: Negative for rash.  Neurological: Negative for dizziness, tremors, numbness and headaches.  Hematological: Negative for adenopathy. Does not bruise/bleed easily.  Psychiatric/Behavioral: Negative for behavioral problems (Depression), sleep disturbance and suicidal ideas. The patient is nervous/anxious.     Today's Vitals   02/06/20 0940  BP: (!) 162/81  Pulse: (!) 59  Resp: 16  Temp: (!) 97.4 F (36.3 C)  SpO2: 98%  Weight: (!) 337 lb 6.4 oz (153 kg)  Height: 5' 9" (1.753 m)   Body mass index is 49.83  kg/m.  Physical Exam Vitals and nursing note reviewed.  Constitutional:      General: She is not in acute distress.    Appearance: She is well-developed. She is obese. She is not diaphoretic.  HENT:     Head: Normocephalic and atraumatic.     Mouth/Throat:     Pharynx: No oropharyngeal exudate.  Eyes:     Pupils: Pupils are equal, round, and reactive to light.  Neck:     Thyroid: No thyromegaly.     Vascular: No JVD.     Trachea: No tracheal deviation.  Cardiovascular:     Rate and Rhythm: Normal rate and regular rhythm.     Heart sounds: Normal heart sounds. No murmur heard.  No friction rub. No gallop.   Pulmonary:     Effort: Pulmonary effort is normal. No respiratory distress.     Breath sounds: Normal breath sounds. No wheezing or rales.     Comments: There is considerable tenderness when palpating the right side of the chest, just under the breast.  There is no bruising or swelling noted. No crepitus can be felt at this time.  Chest:     Chest wall: Tenderness present.  Abdominal:     Palpations: Abdomen is soft.  Musculoskeletal:        General: Normal range of motion.     Cervical back: Normal range of motion and neck supple.     Comments: Left rib tenderness with palpation. No bruising or swelling present.   Lymphadenopathy:     Cervical: No cervical adenopathy.  Skin:    General: Skin is warm and dry.  Neurological:     Mental Status: She is alert and oriented to person, place, and time.     Cranial Nerves: No cranial nerve deficit.  Psychiatric:        Attention and Perception: Attention and perception normal.        Mood and Affect: Affect normal. Mood is anxious.        Speech: Speech normal.        Behavior: Behavior normal. Behavior is cooperative.        Thought Content: Thought content normal.  Cognition and Memory: Cognition and memory normal.        Judgment: Judgment normal.   Assessment/Plan: 1. Mild intermittent asthma without  complication New prescription for anoro. Use one inhalation daily. Samples provided with new prescription sent to her pharmcy. Use rescue inhaler as needed and as prescribed  - umeclidinium-vilanterol (ANORO ELLIPTA) 62.5-25 MCG/INH AEPB; Inhale 1 puff into the lungs daily.  Dispense: 1 each; Refill: 5  2. Shortness of breath Reviewed echocardiogram results with the patient. There is normal LVEF with trace mitral and tricuspid regurgitation. Will repeat chest x-ray and x-ray of the right rib cage. Will get pulmonary function test for further evaluation.  - DG Chest 2 View; Future - Pulmonary function test; Future - DG Ribs Unilateral Right; Future  3. Closed fracture of multiple ribs of right side with delayed healing, subsequent encounter Will repeat chest x-ray and x-ray of the right rib cage. Will get pulmonary function test for further evaluation.  - DG Chest 2 View; Future - DG Ribs Unilateral Right; Future  4. Intercostal pain Improving slowly.   General Counseling: Mildreth verbalizes understanding of the findings of todays visit and agrees with plan of treatment. I have discussed any further diagnostic evaluation that may be needed or ordered today. We also reviewed her medications today. she has been encouraged to call the office with any questions or concerns that should arise related to todays visit.   This patient was seen by Leretha Pol FNP Collaboration with Dr Lavera Guise as a part of collaborative care agreement  Total time spent: 30 Minutes   Time spent includes review of chart, medications, test results, and follow up plan with the patient.      Dr Lavera Guise Internal medicine

## 2020-02-10 ENCOUNTER — Telehealth: Payer: Self-pay

## 2020-02-11 ENCOUNTER — Other Ambulatory Visit: Payer: Self-pay

## 2020-02-11 DIAGNOSIS — R0602 Shortness of breath: Secondary | ICD-10-CM | POA: Insufficient documentation

## 2020-02-11 DIAGNOSIS — S2241XA Multiple fractures of ribs, right side, initial encounter for closed fracture: Secondary | ICD-10-CM | POA: Insufficient documentation

## 2020-02-11 DIAGNOSIS — K8000 Calculus of gallbladder with acute cholecystitis without obstruction: Secondary | ICD-10-CM | POA: Insufficient documentation

## 2020-02-11 DIAGNOSIS — K8020 Calculus of gallbladder without cholecystitis without obstruction: Secondary | ICD-10-CM | POA: Insufficient documentation

## 2020-02-11 HISTORY — DX: Calculus of gallbladder with acute cholecystitis without obstruction: K80.00

## 2020-02-11 MED ORDER — CETIRIZINE HCL 10 MG PO TABS: ORAL_TABLET | ORAL | 4 refills | Status: DC

## 2020-02-11 NOTE — Telephone Encounter (Signed)
lmom 

## 2020-02-11 NOTE — Progress Notes (Signed)
Reviewed with patient at visit.

## 2020-02-11 NOTE — Telephone Encounter (Signed)
Chest x-ray is much improved. There are no further opacities present. Lungs appear well inflated. Ribs are  not healed yet, but if she is feeling better, she can start back on Monday. Is there an option for her to start back with light duty?

## 2020-02-12 ENCOUNTER — Telehealth: Payer: Self-pay

## 2020-02-12 NOTE — Telephone Encounter (Signed)
Pt advised by desha 

## 2020-02-12 NOTE — Telephone Encounter (Signed)
Pt asked to cancel request for work note and made appt with heather.  dbs

## 2020-02-13 ENCOUNTER — Encounter: Payer: Self-pay | Admitting: Nurse Practitioner

## 2020-02-13 ENCOUNTER — Ambulatory Visit: Payer: No Typology Code available for payment source | Admitting: Nurse Practitioner

## 2020-02-13 ENCOUNTER — Other Ambulatory Visit: Payer: Self-pay

## 2020-02-13 VITALS — BP 151/83 | HR 60 | Temp 97.4°F | Resp 16 | Ht 69.0 in | Wt 338.2 lb

## 2020-02-13 DIAGNOSIS — I10 Essential (primary) hypertension: Secondary | ICD-10-CM

## 2020-02-13 DIAGNOSIS — E1165 Type 2 diabetes mellitus with hyperglycemia: Secondary | ICD-10-CM

## 2020-02-13 DIAGNOSIS — R0602 Shortness of breath: Secondary | ICD-10-CM

## 2020-02-13 DIAGNOSIS — R079 Chest pain, unspecified: Secondary | ICD-10-CM | POA: Diagnosis not present

## 2020-02-13 DIAGNOSIS — S2241XD Multiple fractures of ribs, right side, subsequent encounter for fracture with routine healing: Secondary | ICD-10-CM

## 2020-02-13 NOTE — Progress Notes (Signed)
[facility] [place], [place] [address]  Internal MEDICINE  Office Visit Note  Patient Name: Candace Clayton  [id number]  [id number]  Date of Service: 02/13/2020  Chief Complaint  Patient presents with  . Follow-up    pt wants note to return to work  . Depression  . Diabetes  . Hypertension  . Sleep Apnea  . Quality Metric Gaps    HIV screening, TDAP    The patient is here for follow up visit. The patient has been out of work since suffering a fall at home, resulting in multiple fractured ribs on the right side. She subsequently developed shortness of breath with mild, bilateral pleural effusions. Her blood pressure became severely elevated and she had been having intermittent chest pain. A recent chest x-ray and x-ray of the right rib cage shows that rib fractures are healing and the pleural effusions and opacities that were previously seen are resolved. The patient states that, though still present, her chest pain and shortness of breath are improving. Her blood pressure is much improved. She states that she will be ready to return to work on 02/23/2020 without restrictions. She is waf=iting one more week as there are no "light duty" options available at her place of employment.       Current Medication: Outpatient Encounter Medications as of 02/13/2020  Medication Sig  . albuterol (VENTOLIN HFA) 108 (90 Base) MCG/ACT inhaler TAKE 2 PUFFS BY MOUTH EVERY 6 HOURS AS NEEDED FOR WHEEZE OR SHORTNESS OF BREATH  . Blood Glucose Monitoring Suppl (CONTOUR NEXT MONITOR) w/Device KIT 1 each by Does not apply route daily. DX-E11.65  . busPIRone (BUSPAR) 15 MG tablet Take 1 tablet (15 mg total) by mouth 3 (three) times daily. Take one tab po bid for one week, then increase to tid for anxiety  . cetirizine (ZYRTEC) 10 MG tablet TAKE 1 TABLET(S) BY MOUTH DAILY FOR ALLERGIES  . desvenlafaxine (PRISTIQ) 100 MG 24 hr tablet Take 1 tablet (100 mg total) by mouth daily.  .  enalapril (VASOTEC) 20 MG tablet Take 1 tablet (20 mg total) by mouth 2 (two) times daily.  Marland Kitchen glucose blood (CONTOUR NEXT TEST) test strip 1 each by Other route daily. DX E11.65  . hydrochlorothiazide (HYDRODIURIL) 25 MG tablet Take 1 tablet (25 mg total) by mouth daily.  . hydrOXYzine (ATARAX/VISTARIL) 10 MG tablet Take 1 tablet (10 mg total) by mouth 3 (three) times daily as needed.  . metFORMIN (GLUCOPHAGE) 500 MG tablet Take 1 tablet (500 mg total) by mouth 2 (two) times daily with a meal.  . metoprolol tartrate (LOPRESSOR) 25 MG tablet Take 2 tablets (50 mg total) by mouth 2 (two) times daily.  . Microlet Lancets MISC 1 each by Does not apply route daily. DX-E11.65  . Multiple Vitamins-Calcium (ONE-A-DAY WOMENS PO) Take 1 tablet by mouth daily.  . NON FORMULARY cpap device  . OVER THE COUNTER MEDICATION 1 tablet daily as needed. Takes generic stool softener for constipation d/t oxycodone.  Marland Kitchen oxybutynin (DITROPAN-XL) 10 MG 24 hr tablet Take 1 tablet (10 mg total) by mouth at bedtime.  Marland Kitchen oxyCODONE-acetaminophen (PERCOCET) 5-325 MG tablet Take 1 tablet by mouth every 8 (eight) hours as needed for severe pain. Must last 30 days.  Derrill Memo ON 02/26/2020] oxyCODONE-acetaminophen (PERCOCET) 5-325 MG tablet Take 1 tablet by mouth every 8 (eight) hours as needed for severe pain. Must last 30 days.  . pregabalin (LYRICA) 50 MG capsule 50 mg during day, 100  mg qhs  . umeclidinium-vilanterol (ANORO ELLIPTA) 62.5-25 MCG/INH AEPB Inhale 1 puff into the lungs daily.  . [DISCONTINUED] amoxicillin-clavulanate (AUGMENTIN) 875-125 MG tablet Take 1 tablet by mouth 2 (two) times daily.   No facility-administered encounter medications on file as of 02/13/2020.    Surgical History: Past Surgical History:  Procedure Laterality Date  . CESAREAN SECTION  1992  . COLONOSCOPY     polyps removed first procedure. 2nd time all was clear  . COLONOSCOPY WITH PROPOFOL N/A 03/14/2019   Procedure: COLONOSCOPY WITH PROPOFOL;   Surgeon: Lin Landsman, MD;  Location: [facility] ENDOSCOPY;  Service: Gastroenterology;  Laterality: N/A;  . FINGER SURGERY Left 2011   left finger cut off x 2.(only up to last digit)  . HEMATOMA EVACUATION Left 1999   upper part of foot was injured d/t 500lb weight landing on her foot.   Marland Kitchen LAPAROSCOPIC SALPINGO OOPHERECTOMY Left 04/02/2018   Procedure: LAPAROSCOPIC SALPINGO OOPHORECTOMY;  Surgeon: Malachy Mood, MD;  Location: [facility] ORS;  Service: Gynecology;  Laterality: Left;    Medical History: Past Medical History:  Diagnosis Date  . Anginal pain (HCC)    tightness related to anxiety  . Anxiety   . Depression   . Diabetes mellitus without complication ([place])   . GERD (gastroesophageal reflux disease)    throws up easily but not diagnosed with reflux  . History of C-section 59  . Hypertension   . Sleep apnea    uses cpap    Family History: Family History  Problem Relation Age of Onset  . Breast cancer Mother 40       x 3 times  . Colon cancer Mother   . Brain cancer Mother   . Cancer - Colon Mother   . Cancer - Other Mother   . Heart Problems Father   . Colon cancer Brother   . Heart Problems Brother     Social History   Socioeconomic History  . Marital status: Divorced    Spouse name: Not on file  . Number of children: 1  . Years of education: Not on file  . Highest education level: Not on file  Occupational History  . Occupation: works in Secretary/administrator  Tobacco Use  . Smoking status: Never Smoker  . Smokeless tobacco: Never Used  Vaping Use  . Vaping Use: Never used  Substance and Sexual Activity  . Alcohol use: No  . Drug use: No  . Sexual activity: Not Currently    Birth control/protection: Post-menopausal  Other Topics Concern  . Not on file  Social History Narrative   Son has schizophrenia and autism but is fully capable of helping mother after surgery   Social Determinants of Health   Financial Resource Strain:   . Difficulty of  Paying Living Expenses: Not on file  Food Insecurity:   . Worried About Charity fundraiser in the Last Year: Not on file  . Ran Out of Food in the Last Year: Not on file  Transportation Needs:   . Lack of Transportation (Medical): Not on file  . Lack of Transportation (Non-Medical): Not on file  Physical Activity:   . Days of Exercise per Week: Not on file  . Minutes of Exercise per Session: Not on file  Stress:   . Feeling of Stress : Not on file  Social Connections:   . Frequency of Communication with Friends and Family: Not on file  . Frequency of Social Gatherings with Friends and Family: Not on file  .  Attends Religious Services: Not on file  . Active Member of Clubs or Organizations: Not on file  . Attends Archivist Meetings: Not on file  . Marital Status: Not on file  Intimate Partner Violence:   . Fear of Current or Ex-Partner: Not on file  . Emotionally Abused: Not on file  . Physically Abused: Not on file  . Sexually Abused: Not on file      Review of Systems  Constitutional: Positive for activity change and fatigue. Negative for chills and unexpected weight change.  HENT: Negative for congestion, postnasal drip, rhinorrhea, sneezing and sore throat.   Eyes: Negative for redness.  Respiratory: Positive for shortness of breath. Negative for cough and chest tightness.        Improved symptoms.  Cardiovascular: Positive for chest pain. Negative for palpitations.       Blood pressure improved. Chest discomfort also improved.   Gastrointestinal: Negative for abdominal pain, constipation, diarrhea, nausea and vomiting.  Genitourinary: Positive for frequency and urgency.  Musculoskeletal: Positive for arthralgias and back pain. Negative for joint swelling and neck pain.  Skin: Negative for rash.  Neurological: Negative for dizziness, tremors, numbness and headaches.  Hematological: Negative for adenopathy. Does not bruise/bleed easily.  Psychiatric/Behavioral:  Negative for behavioral problems (Depression), sleep disturbance and suicidal ideas. The patient is nervous/anxious.     Today's Vitals   02/13/20 0958  BP: (!) 151/83  Pulse: 60  Resp: 16  Temp: (!) 97.4 F (36.3 C)  SpO2: 98%  Weight: (!) 338 lb 3.2 oz (153.4 kg)  Height: _0  (1.753 m)   Body mass index is 49.94 kg/m.  Physical Exam Vitals and nursing note reviewed.  Constitutional:      Appearance: She is well-developed. She is obese. She is not diaphoretic.  HENT:     Head: Normocephalic and atraumatic.     Mouth/Throat:     Pharynx: No oropharyngeal exudate.  Eyes:     Pupils: Pupils are equal, round, and reactive to light.  Neck:     Thyroid: No thyromegaly.     Vascular: No JVD.     Trachea: No tracheal deviation.  Cardiovascular:     Rate and Rhythm: Normal rate and regular rhythm.     Heart sounds: Normal heart sounds. No murmur heard.  No friction rub. No gallop.   Pulmonary:     Effort: Pulmonary effort is normal. No respiratory distress.     Breath sounds: Normal breath sounds. No wheezing or rales.     Comments: Improved right sided chest tenderness. Chest:     Chest wall: Tenderness present.  Abdominal:     Palpations: Abdomen is soft.  Musculoskeletal:        General: Normal range of motion.     Cervical back: Normal range of motion and neck supple.     Comments: Improved tenderness of right rib cage area.   Lymphadenopathy:     Cervical: No cervical adenopathy.  Skin:    General: Skin is warm and dry.  Neurological:     Mental Status: She is alert and oriented to person, place, and time.     Cranial Nerves: No cranial nerve deficit.  Psychiatric:        Attention and Perception: Attention and perception normal.        Mood and Affect: Affect normal. Mood is anxious.        Speech: Speech normal.        Behavior: Behavior normal. Behavior  is cooperative.        Thought Content: Thought content normal.        Cognition and Memory: Cognition  and memory normal.        Judgment: Judgment normal.   Assessment/Plan:  1. Closed fracture of multiple ribs of right side with routine healing, subsequent encounter Reviewed recent x-ray of right rib cage. It shows displaced acute to subacute fractures of the anterolateral right sixth through eighth ribs and possibly anterior right ninth rib. Patient appearing more comfortable. Pain improving. Will continue to monitor. Patient given work note indicating she may return to work without restrictions beginning 02/23/2020.   2. Shortness of breath Improving. Most recent chest x-ray shows resolution of previously noted pneumonia.   3. Chest pain, unspecified type Reviewed echocardiogram with the patient which shows normal LVEF with trace valvular regurgitation. Will monitor.   4. Essential (primary) hypertension Improved. Will monitor.   5. Type 2 diabetes mellitus with hyperglycemia, without long-term current use of insulin ([place]) Continue diabetic medication as prescribed   General Counseling: Mariona verbalizes understanding of the findings of todays visit and agrees with plan of treatment. I have discussed any further diagnostic evaluation that may be needed or ordered today. We also reviewed her medications today. she has been encouraged to call the office with any questions or concerns that should arise related to todays visit.   This patient was seen by Leretha Pol FNP Collaboration with Dr Lavera Guise as a part of collaborative care agreement   Total time spent: 30  Minutes   Time spent includes review of chart, medications, test results, and follow up plan with the patient.      Dr Lavera Guise Internal medicine

## 2020-02-17 ENCOUNTER — Telehealth: Payer: Self-pay

## 2020-02-17 NOTE — Telephone Encounter (Signed)
Spoke with patient multiple times and she has been advised our fax line is not secure and can not accept disability papers through fax and she needs to bring original copies or have them emailed. Candace Clayton

## 2020-02-18 ENCOUNTER — Telehealth: Payer: Self-pay

## 2020-02-18 DIAGNOSIS — J189 Pneumonia, unspecified organism: Secondary | ICD-10-CM | POA: Insufficient documentation

## 2020-02-18 HISTORY — DX: Pneumonia, unspecified organism: J18.9

## 2020-02-18 NOTE — Telephone Encounter (Signed)
Faxed patient return to work duty form, put original up front, pt has been advised that disability papers need to be dropped off or emailed to be completed. Beth

## 2020-02-23 ENCOUNTER — Other Ambulatory Visit: Payer: Self-pay | Admitting: Nurse Practitioner

## 2020-02-23 DIAGNOSIS — E559 Vitamin D deficiency, unspecified: Secondary | ICD-10-CM | POA: Diagnosis not present

## 2020-02-23 DIAGNOSIS — Z0001 Encounter for general adult medical examination with abnormal findings: Secondary | ICD-10-CM | POA: Diagnosis not present

## 2020-02-23 DIAGNOSIS — E1165 Type 2 diabetes mellitus with hyperglycemia: Secondary | ICD-10-CM | POA: Diagnosis not present

## 2020-02-23 DIAGNOSIS — I10 Essential (primary) hypertension: Secondary | ICD-10-CM | POA: Diagnosis not present

## 2020-02-24 LAB — COMPREHENSIVE METABOLIC PANEL
ALT: 24 IU/L (ref 0–32)
AST: 14 IU/L (ref 0–40)
Albumin/Globulin Ratio: 1.6 (ref 1.2–2.2)
Albumin: 4.2 g/dL (ref 3.8–4.9)
Alkaline Phosphatase: 103 IU/L (ref 48–121)
BUN/Creatinine Ratio: 22 (ref 9–23)
BUN: 16 mg/dL (ref 6–24)
Bilirubin Total: 0.3 mg/dL (ref 0.0–1.2)
CO2: 22 mmol/L (ref 20–29)
Calcium: 9.9 mg/dL (ref 8.7–10.2)
Chloride: 103 mmol/L (ref 96–106)
Creatinine, Ser: 0.72 mg/dL (ref 0.57–1.00)
GFR calc Af Amer: 106 mL/min/{1.73_m2} (ref >59)
GFR calc non Af Amer: 92 mL/min/{1.73_m2} (ref >59)
Globulin, Total: 2.6 g/dL (ref 1.5–4.5)
Glucose: 132 mg/dL — ABNORMAL HIGH (ref 65–99)
Potassium: 5 mmol/L (ref 3.5–5.2)
Sodium: 141 mmol/L (ref 134–144)
Total Protein: 6.8 g/dL (ref 6.0–8.5)

## 2020-02-24 LAB — LIPID PANEL WITH LDL/HDL RATIO
Cholesterol, Total: 260 mg/dL — ABNORMAL HIGH (ref 100–199)
HDL: 60 mg/dL (ref >39)
LDL Chol Calc (NIH): 166 mg/dL — ABNORMAL HIGH (ref 0–99)
LDL/HDL Ratio: 2.8 ratio (ref 0.0–3.2)
Triglycerides: 189 mg/dL — ABNORMAL HIGH (ref 0–149)
VLDL Cholesterol Cal: 34 mg/dL (ref 5–40)

## 2020-02-24 LAB — VITAMIN D 25 HYDROXY (VIT D DEFICIENCY, FRACTURES): Vit D, 25-Hydroxy: 12.6 ng/mL — ABNORMAL LOW (ref 30.0–100.0)

## 2020-02-24 LAB — CBC
Hematocrit: 42.6 % (ref 34.0–46.6)
Hemoglobin: 14 g/dL (ref 11.1–15.9)
MCH: 27.6 pg (ref 26.6–33.0)
MCHC: 32.9 g/dL (ref 31.5–35.7)
MCV: 84 fL (ref 79–97)
Platelets: 249 10*3/uL (ref 150–450)
RBC: 5.07 x10E6/uL (ref 3.77–5.28)
RDW: 13.4 % (ref 11.7–15.4)
WBC: 7.4 10*3/uL (ref 3.4–10.8)

## 2020-02-24 LAB — TSH: TSH: 1.51 u[IU]/mL (ref 0.450–4.500)

## 2020-02-24 LAB — HCV AB W REFLEX TO QUANT PCR: HCV Ab: <0.1 s/co ratio (ref 0.0–0.9)

## 2020-02-24 LAB — T4, FREE: Free T4: 0.9 ng/dL (ref 0.82–1.77)

## 2020-02-24 LAB — HCV INTERPRETATION

## 2020-02-26 NOTE — Progress Notes (Signed)
Reviewed. Discuss with patient at next visit 03/19/2020

## 2020-03-12 ENCOUNTER — Other Ambulatory Visit: Payer: Self-pay

## 2020-03-12 DIAGNOSIS — J4520 Mild intermittent asthma, uncomplicated: Secondary | ICD-10-CM

## 2020-03-12 MED ORDER — ALBUTEROL SULFATE HFA 108 (90 BASE) MCG/ACT IN AERS: INHALATION_SPRAY | RESPIRATORY_TRACT | 3 refills | Status: DC

## 2020-03-19 ENCOUNTER — Encounter: Payer: BC Managed Care – PPO | Admitting: Nurse Practitioner

## 2020-03-25 ENCOUNTER — Encounter: Payer: BC Managed Care – PPO | Admitting: Student in an Organized Health Care Education/Training Program

## 2020-04-01 ENCOUNTER — Other Ambulatory Visit: Payer: Self-pay

## 2020-04-01 ENCOUNTER — Encounter: Payer: Self-pay | Admitting: Student in an Organized Health Care Education/Training Program

## 2020-04-01 ENCOUNTER — Ambulatory Visit
Payer: BC Managed Care – PPO | Attending: Student in an Organized Health Care Education/Training Program | Admitting: Student in an Organized Health Care Education/Training Program

## 2020-04-01 VITALS — BP 166/72 | HR 56 | Temp 98.0°F | Resp 18 | Ht 69.0 in | Wt 320.0 lb

## 2020-04-01 DIAGNOSIS — G8929 Other chronic pain: Secondary | ICD-10-CM | POA: Diagnosis not present

## 2020-04-01 DIAGNOSIS — M5136 Other intervertebral disc degeneration, lumbar region: Secondary | ICD-10-CM | POA: Diagnosis not present

## 2020-04-01 DIAGNOSIS — M47816 Spondylosis without myelopathy or radiculopathy, lumbar region: Secondary | ICD-10-CM | POA: Insufficient documentation

## 2020-04-01 DIAGNOSIS — G894 Chronic pain syndrome: Secondary | ICD-10-CM | POA: Insufficient documentation

## 2020-04-01 DIAGNOSIS — M1711 Unilateral primary osteoarthritis, right knee: Secondary | ICD-10-CM | POA: Insufficient documentation

## 2020-04-01 DIAGNOSIS — M25561 Pain in right knee: Secondary | ICD-10-CM

## 2020-04-01 MED ORDER — PREGABALIN 50 MG PO CAPS: ORAL_CAPSULE | ORAL | 2 refills | Status: DC

## 2020-04-01 MED ORDER — OXYCODONE-ACETAMINOPHEN 5-325 MG PO TABS: 1.0000 | ORAL_TABLET | Freq: Three times a day (TID) | ORAL | 0 refills | Status: DC | PRN

## 2020-04-01 NOTE — Progress Notes (Signed)
Nursing Pain Medication Assessment:  Safety precautions to be maintained throughout the outpatient stay will include: orient to surroundings, keep bed in low position, maintain call bell within reach at all times, provide assistance with transfer out of bed and ambulation.  Medication Inspection Compliance: Pill count conducted under aseptic conditions, in front of the patient. Neither the pills nor the bottle was removed from the patient's sight at any time. Once count was completed pills were immediately returned to the patient in their original bottle.  Medication: Oxycodone IR Pill/Patch Count: 7 of 90 pills remain Pill/Patch Appearance: Markings consistent with prescribed medication Bottle Appearance: Standard pharmacy container. Clearly labeled. Filled Date: 09 / 07 / 2021 Last Medication intake:  Day before yesterday

## 2020-04-01 NOTE — Patient Instructions (Signed)
Radiofrequency Lesioning Radiofrequency lesioning is a procedure that is performed to relieve pain. The procedure is often used for back, neck, or arm pain. Radiofrequency lesioning involves the use of a machine that creates radio waves to make heat. During the procedure, the heat is applied to the nerve that carries the pain signal. The heat damages the nerve and interferes with the pain signal. Pain relief usually starts about 2 weeks after the procedure and lasts for 6 months to 1 year. You will be awake during the procedure. You will need to be able to talk with the health care provider during the procedure. Tell a health care provider about:  Any allergies you have.  All medicines you are taking, including vitamins, herbs, eye drops, creams, and over-the-counter medicines.  Any problems you or family members have had with anesthetic medicines.  Any blood disorders you have.  Any surgeries you have had.  Any medical conditions you have or have had.  Whether you are pregnant or may be pregnant. What are the risks? Generally, this is a safe procedure. However, problems may occur, including:  Pain or soreness at the injection site.  Allergic reaction to medicines given during the procedure.  Bleeding.  Infection at the injection site.  Damage to nerves or blood vessels. What happens before the procedure? Staying hydrated Follow instructions from your health care provider about hydration, which may include:  Up to 2 hours before the procedure - you may continue to drink clear liquids, such as water, clear fruit juice, black coffee, and plain tea. Eating and drinking Follow instructions from your health care provider about eating and drinking, which may include:  8 hours before the procedure - stop eating heavy meals or foods, such as meat, fried foods, or fatty foods.  6 hours before the procedure - stop eating light meals or foods, such as toast or cereal.  6 hours before  the procedure - stop drinking milk or drinks that contain milk.  2 hours before the procedure - stop drinking clear liquids. Medicines Ask your health care provider about:  Changing or stopping your regular medicines. This is especially important if you are taking diabetes medicines or blood thinners.  Taking medicines such as aspirin and ibuprofen. These medicines can thin your blood. Do not take these medicines unless your health care provider tells you to take them.  Taking over-the-counter medicines, vitamins, herbs, and supplements. General instructions  Plan to have someone take you home from the hospital or clinic.  If you will be going home right after the procedure, plan to have someone with you for 24 hours.  Ask your health care provider what steps will be taken to help prevent infection. These may include: ? Removing hair at the procedure site. ? Washing skin with a germ-killing soap. ? Taking antibiotic medicine. What happens during the procedure?   An IV will be inserted into one of your veins.  You will be given one or more of the following: ? A medicine to help you relax (sedative). ? A medicine to numb the area (local anesthetic).  Your health care provider will insert a radiofrequency needle into the area to be treated. This is done with the help of a type of X-ray (fluoroscopy).  A wire that carries the radio waves (electrode) will be put through the radiofrequency needle.  An electrical pulse will be sent through the electrode to verify the correct nerve that is causing your pain. You will feel a tingling sensation,   and you may have muscle twitching.  The tissue around the needle tip will be heated by an electric current that comes from the radiofrequency machine. This will numb the nerves.  The needle will be removed.  A bandage (dressing) will be put on the insertion area. The procedure may vary among health care providers and hospitals. What happens  after the procedure?  Your blood pressure, heart rate, breathing rate, and blood oxygen level will be monitored until you leave the hospital or clinic.  Return to your normal activities as told by your health care provider. Ask your health care provider what activities are safe for you.  Do not drive for 24 hours if you were given a sedative during your procedure. Summary  Radiofrequency lesioning is a procedure that is performed to relieve pain. The procedure is often used for back, neck, or arm pain.  Radiofrequency lesioning involves the use of a machine that creates radio waves to make heat.  Plan to have someone take you home from the hospital or clinic. Do not drive for 24 hours if you were given a sedative during your procedure.  Return to your normal activities as told by your health care provider. Ask your health care provider what activities are safe for you. This information is not intended to replace advice given to you by your health care provider. Make sure you discuss any questions you have with your health care provider. Document Revised: 02/28/2018 Document Reviewed: 02/28/2018 Elsevier Patient Education  2020 [employer]  

## 2020-04-01 NOTE — Progress Notes (Signed)
PROVIDER NOTE: Information contained herein reflects review and annotations entered in association with encounter. Interpretation of such information and data should be left to medically-trained personnel. Information provided to patient can be located elsewhere in the medical record under "Patient Instructions". Document created using STT-dictation technology, any transcriptional errors that may result from process are unintentional.    Patient: Candace Clayton  Service Category: E/M  Provider: Gillis Santa, MD  DOB: Feb 09, 1961  DOS: 04/01/2020  Specialty: Interventional Pain Management  MRN: [id number]  Setting: Ambulatory outpatient  PCP: Ronnell Freshwater, NP  Type: Established Patient    Referring Provider: Ronnell Freshwater, NP  Location: Office  Delivery: Face-to-face     HPI  Candace Clayton, a 59 y.o. year old female, is here today because of her Arthritis of right knee [M17.11]. [name] primary complain today is Back Pain (low), Rib Fracture (6,7,8), and Knee Pain (right) Last encounter: My last encounter with her was on 01/27/2020. Pertinent problems: [name] has Major depressive disorder, recurrent episode, moderate ([place]); Morbid obesity ([place]); Low back pain with sciatica; OSA on CPAP; Lumbar degenerative disc disease; Lumbar facet arthropathy (R>L); Lumbar spondylosis; Chronic pain syndrome; and Type 2 diabetes mellitus with hyperglycemia, without long-term current use of insulin (HCC) on their pertinent problem list. Pain Assessment: Severity of   is reported as a 7 /10. Location: Back Lower/ . Onset: More than a month ago. Quality: Burning, Constant (stinging). Timing: Constant. Modifying factor(s): medications, rest, heat, ice. Vitals:  height is 5' 9"  (1.753 m) and weight is 320 lb (145.2 kg) (abnormal). Her oral temperature is 98 F (36.7 C). Her blood pressure is 166/72 (abnormal) and her pulse is 56 (abnormal). Her respiration is 18 and oxygen saturation is 100%.   Reason  for encounter: worsening of previously known (established) problem and medication management.  Patient states that her right-sided rib pain has improved since her fall.  She did sustain rib fractures but her intercostal pain seems to be decreasing. Patient is endorsing severe right knee pain that is worse with weightbearing.  This is related to right knee osteoarthritis.  Recommend right knee intra-articular steroid injection. She is endorsing worsening low back pain that is worse with lumbar extension.  She is status post right L3, L4, L5, S1 RFA on 05/05/2019.  This provided her with significant pain relief for approximately 8 to 9 months with gradual return of pain thereafter.  She is also having pain on her left side.  She has undergone 2 series of diagnostic lumbar facet medial branch nerve blocks.  Recommend repeating right L3, L4, L5 lumbar radiofrequency ablation and also doing the left L3, L4, L5 lumbar radiofrequency ablation as well. We will refill oxycodone as below.  No change in dose.  UDS up-to-date and appropriate.  Pharmacotherapy Assessment   Analgesic: Percocet 5 mg 3 times daily as needed, quantity 90/month; MME equals 22.5    Monitoring: [place] PMP: PDMP reviewed during this encounter.       Pharmacotherapy: No side-effects or adverse reactions reported. Compliance: No problems identified. Effectiveness: Clinically acceptable.  Hart Rochester, RN  04/01/2020  9:30 AM  Sign when Signing Visit Nursing Pain Medication Assessment:  Safety precautions to be maintained throughout the outpatient stay will include: orient to surroundings, keep bed in low position, maintain call bell within reach at all times, provide assistance with transfer out of bed and ambulation.  Medication Inspection Compliance: Pill count conducted under aseptic conditions, in front of the  patient. Neither the pills nor the bottle was removed from the patient's sight at any time. Once count was completed pills  were immediately returned to the patient in their original bottle.  Medication: Oxycodone IR Pill/Patch Count: 7 of 90 pills remain Pill/Patch Appearance: Markings consistent with prescribed medication Bottle Appearance: Standard pharmacy container. Clearly labeled. Filled Date: 09 / 07 / 2021 Last Medication intake:  Day before yesterday    UDS:  Summary  Date Value Ref Range Status  08/26/2019 Note  Final    Comment:    ==================================================================== [facility] (MW) ==================================================================== Test                             Result       Flag       Units Drug Present and Declared for Prescription Verification   Oxycodone                      139          EXPECTED   ng/mg creat   Oxymorphone                    688          EXPECTED   ng/mg creat   Noroxycodone                   473          EXPECTED   ng/mg creat   Noroxymorphone                 163          EXPECTED   ng/mg creat    Sources of oxycodone are scheduled prescription medications.    Oxymorphone, noroxycodone, and noroxymorphone are expected    metabolites of oxycodone. Oxymorphone is also available as a    scheduled prescription medication. ==================================================================== Test                      Result    Flag   Units      Ref Range   Creatinine              64               mg/dL      >=20 ==================================================================== Declared Medications:  The flagging and interpretation on this report are based on the  following declared medications.  Unexpected results may arise from  inaccuracies in the declared medications.  **Note: The testing scope of this panel includes these medications:  Oxycodone  **Note: The testing scope of this panel does not include the  following reported medications:  Acetaminophen  Albuterol  Budesonide (Symbicort)   Buspirone  Cetirizine  Desvenlafaxine (Pristiq)  Docusate (Stool Softener)  Enalapril  Formoterol (Symbicort)  Hydrochlorothiazide  Hydroxyzine  Multivitamin  Pregabalin  Sulfamethoxazole (Bactrim)  Trimethoprim (Bactrim) ==================================================================== For clinical consultation, please call [phone]. ====================================================================      ROS  Constitutional: Denies any fever or chills Gastrointestinal: No reported hemesis, hematochezia, vomiting, or acute GI distress Musculoskeletal: Positive low back pain Neurological: No reported episodes of acute onset apraxia, aphasia, dysarthria, agnosia, amnesia, paralysis, loss of coordination, or loss of consciousness  Medication Review  Contour Next Monitor, Microlet Lancets, Multiple Vitamins-Minerals, NON FORMULARY, OVER THE COUNTER MEDICATION, albuterol, busPIRone, cetirizine, desvenlafaxine, enalapril, glucose blood, hydrOXYzine, hydrochlorothiazide, metFORMIN, metoprolol tartrate, oxyCODONE-acetaminophen, oxybutynin, pregabalin, and umeclidinium-vilanterol  History Review  Allergy: Ms. Faciane is allergic to gabapentin. Drug: Ms. Stockburger  reports no history of drug use. Alcohol:  reports no history of alcohol use. Tobacco:  reports that she has never smoked. She has never used smokeless tobacco. Social: Ms. Adolph  reports that she has never smoked. She has never used smokeless tobacco. She reports that she does not drink alcohol and does not use drugs. Medical:  has a past medical history of Anginal pain ([place]), Anxiety, Depression, Diabetes mellitus without complication ([place]), GERD (gastroesophageal reflux disease), History of C-section (1992), Hypertension, and Sleep apnea. Surgical: Ms. Agro  has a past surgical history that includes Cesarean section (1992); Finger surgery (Left, 2011); Hematoma evacuation (Left, 1999); Colonoscopy; Laparoscopic  salpingo oophorectomy (Left, 04/02/2018); and Colonoscopy with propofol (N/A, 03/14/2019). Family: family history includes Brain cancer in her mother; Breast cancer (age of onset: 11) in her mother; Cancer - Colon in her mother; Cancer - Other in her mother; Colon cancer in her brother and mother; Heart Problems in her brother and father.  Laboratory Chemistry Profile   Renal Lab Results  Component Value Date   BUN 16 02/23/2020   CREATININE 0.72 02/23/2020   BCR 22 02/23/2020   GFRAA 106 02/23/2020   GFRNONAA 92 02/23/2020     Hepatic Lab Results  Component Value Date   AST 14 02/23/2020   ALT 24 02/23/2020   ALBUMIN 4.2 02/23/2020   ALKPHOS 103 02/23/2020     Electrolytes Lab Results  Component Value Date   NA 141 02/23/2020   K 5.0 02/23/2020   CL 103 02/23/2020   CALCIUM 9.9 02/23/2020     Bone Lab Results  Component Value Date   VD25OH 12.6 (L) 02/23/2020     Inflammation (CRP: Acute Phase) (ESR: Chronic Phase) No results found for: CRP, ESRSEDRATE, LATICACIDVEN     Note: Above Lab results reviewed.  Recent Imaging Review  DG Ribs Unilateral Right CLINICAL DATA:  Possible right rib fractures from fall 2-3 weeks ago.  EXAM: RIGHT RIBS - 2 VIEW  COMPARISON:  Chest x-ray 01/16/2020  FINDINGS: There are displaced acute to subacute fractures of the anterolateral right sixth through eighth ribs and possibly involving the anterior aspect of the right ninth rib. Degenerative change of the spine.  IMPRESSION: Displaced acute to subacute fractures of the anterolateral right sixth through eighth ribs and possibly anterior right ninth rib.  Electronically Signed   By: Marin Olp M.D.   On: 02/06/2020 15:59 DG Chest 2 View CLINICAL DATA:  Shortness of breath. Recent history of right rib fractures.  EXAM: CHEST - 2 VIEW  COMPARISON:  01/16/2020  FINDINGS: Lungs are adequately inflated without focal airspace consolidation or effusion. No evidence of  pneumothorax. Cardiomediastinal silhouette is within normal. Mild degenerative changes of the spine.  IMPRESSION: No active cardiopulmonary disease.  Electronically Signed   By: Marin Olp M.D.   On: 02/06/2020 15:54 Note: Reviewed        Physical Exam  General appearance: Well nourished, well developed, and well hydrated. In no apparent acute distress Mental status: Alert, oriented x 3 (person, place, & time)       Respiratory: No evidence of acute respiratory distress Eyes: PERLA Vitals: BP (!) 166/72   Pulse (!) 56   Temp 98 F (36.7 C) (Oral)   Resp 18   Ht 5' 9"  (1.753 m)   Wt (!) 320 lb (145.2 kg)   SpO2 100%   BMI 47.26 kg/m  BMI:  Estimated body mass index is 47.26 kg/m as calculated from the following:   Height as of this encounter: 5' 9"  (1.753 m).   Weight as of this encounter: 320 lb (145.2 kg). Ideal: Ideal body weight: 66.2 kg (145 lb 15.1 oz) Adjusted ideal body weight: 97.8 kg (215 lb 9.1 oz)     Assessment   Status Diagnosis  Having a Flare-up Having a Flare-up Having a Flare-up 1. Arthritis of right knee   2. Chronic pain of right knee   3. Chronic pain syndrome   4. Lumbar facet arthropathy (R>L)   5. Lumbar spondylosis   6. Lumbar degenerative disc disease      Updated Problems: Problem  Arthritis of Right Knee    Plan of Care   Ms. CHARLETTA VOIGHT has a current medication list which includes the following long-term medication(s): albuterol, cetirizine, desvenlafaxine, enalapril, hydrochlorothiazide, metformin, metoprolol tartrate, [START ON 04/06/2020] oxycodone-acetaminophen, [START ON 05/06/2020] oxycodone-acetaminophen, [START ON 06/05/2020] oxycodone-acetaminophen, and pregabalin.   1.  Refill Lyrica and oxycodone as below. 2.  Plan for right knee intra-articular steroid injection for right knee osteoarthritis. 3. lumbar radiofrequency ablation starting with the right side first.  Previously done for RIGHT L3,4,5,S1 October 2020  which provided significant pain relief and improvement in functional status.  Left side to be done 2 weeks after right side.  Pharmacotherapy (Medications Ordered): Meds ordered this encounter  Medications  . oxyCODONE-acetaminophen (PERCOCET/ROXICET) 5-325 MG tablet    Sig: Take 1 tablet by mouth every 8 (eight) hours as needed for severe pain.    Dispense:  90 tablet    Refill:  0    For chronic pain syndrome. To last for 30 days from fill date.  Marland Kitchen oxyCODONE-acetaminophen (PERCOCET/ROXICET) 5-325 MG tablet    Sig: Take 1 tablet by mouth every 8 (eight) hours as needed for severe pain.    Dispense:  90 tablet    Refill:  0    For chronic pain syndrome. To last for 30 days from fill date.  Marland Kitchen oxyCODONE-acetaminophen (PERCOCET/ROXICET) 5-325 MG tablet    Sig: Take 1 tablet by mouth every 8 (eight) hours as needed for severe pain.    Dispense:  90 tablet    Refill:  0    For chronic pain syndrome. To last for 30 days from fill date.  . pregabalin (LYRICA) 50 MG capsule    Sig: 50 mg during day, 100 mg qhs    Dispense:  90 capsule    Refill:  2   Orders:  Orders Placed This Encounter  Procedures  . KNEE INJECTION    Local Anesthetic & Steroid injection.    Standing Status:   Future    Standing Expiration Date:   07/02/2020    Scheduling Instructions:     Side:RIGHT     Sedation: None     Timeframe: As soon as schedule allows    Order Specific Question:   Where will this procedure be performed?    Answer:   [facility]  . Radiofrequency,Lumbar    Standing Status:   Future    Standing Expiration Date:   04/01/2021    Scheduling Instructions:     Side(s): Right     Level: L3-4, L4-5, Facets (L3, L4, L5,  Medial Branch Nerves)     Sedation: with.     Scheduling Timeframe: As soon as pre-approved    Order Specific Question:   Where will this procedure be performed?    Answer:  [facility]  . Radiofrequency,Lumbar    Standing Status:   Future    Standing  Expiration Date:   04/01/2021    Scheduling Instructions:     Side(s): Left-sided     Level: L3-4, L4-5,  Facets ( L3, L4, L5,Medial Branch Nerves)     Sedation: Patient's choice.     Scheduling Timeframe: As soon as pre-approved     2 weeks after right    Order Specific Question:   Where will this procedure be performed?    Answer:   [facility]   Follow-up plan:   Return in about 5 days (around 04/06/2020) for right knee sterod injection (PM 10/12).     Status post diagnostic bilateral L3, L4, L5, S1 facet medial branch nerve blocks, status post right L3, L4, L5, S1 RFA on 05/05/2019; return for the left as needed.  As needed intercostal nerve block for left rib fractures        Recent Visits Date Type Provider Dept  01/27/20 Office Visit Gillis Santa, MD [facility]  Showing recent visits within past 90 days and meeting all other requirements Today's Visits Date Type Provider Dept  04/01/20 Office Visit Gillis Santa, MD [facility]  Showing today's visits and meeting all other requirements Future Appointments Date Type Provider Dept  06/29/20 Appointment Gillis Santa, MD [facility]  Showing future appointments within next 90 days and meeting all other requirements  I discussed the assessment and treatment plan with the patient. The patient was provided an opportunity to ask questions and all were answered. The patient agreed with the plan and demonstrated an understanding of the instructions.  Patient advised to call back or seek an in-person evaluation if the symptoms or condition worsens.  Duration of encounter: 56mnutes.  Note by: BGillis Santa MD Date: 04/01/2020; Time: 1:18 PM

## 2020-04-05 ENCOUNTER — Other Ambulatory Visit: Payer: Self-pay

## 2020-04-05 DIAGNOSIS — I10 Essential (primary) hypertension: Secondary | ICD-10-CM

## 2020-04-05 MED ORDER — METOPROLOL TARTRATE 25 MG PO TABS: 50.0000 mg | ORAL_TABLET | Freq: Two times a day (BID) | ORAL | 1 refills | Status: DC

## 2020-04-05 MED ORDER — CONTOUR NEXT TEST VI STRP: 1.0000 | ORAL_STRIP | Freq: Every day | 1 refills | Status: DC

## 2020-04-06 ENCOUNTER — Encounter: Payer: BC Managed Care – PPO | Admitting: Hospice and Palliative Medicine

## 2020-04-13 ENCOUNTER — Telehealth: Payer: Self-pay

## 2020-04-14 ENCOUNTER — Other Ambulatory Visit: Payer: Self-pay

## 2020-04-14 ENCOUNTER — Encounter: Payer: Self-pay | Admitting: Student in an Organized Health Care Education/Training Program

## 2020-04-14 ENCOUNTER — Ambulatory Visit
Admission: RE | Admit: 2020-04-14 | Discharge: 2020-04-14 | Disposition: A | Discharge status: Home / Self Care | Payer: BC Managed Care – PPO | Source: Ambulatory Visit | Attending: Student in an Organized Health Care Education/Training Program | Admitting: Student in an Organized Health Care Education/Training Program

## 2020-04-14 ENCOUNTER — Ambulatory Visit (HOSPITAL_BASED_OUTPATIENT_CLINIC_OR_DEPARTMENT_OTHER): Payer: BC Managed Care – PPO | Admitting: Student in an Organized Health Care Education/Training Program

## 2020-04-14 VITALS — BP 129/75 | HR 65 | Temp 98.4°F | Resp 18 | Ht 69.0 in | Wt 320.0 lb

## 2020-04-14 DIAGNOSIS — M47816 Spondylosis without myelopathy or radiculopathy, lumbar region: Secondary | ICD-10-CM | POA: Diagnosis not present

## 2020-04-14 DIAGNOSIS — J989 Respiratory disorder, unspecified: Secondary | ICD-10-CM | POA: Insufficient documentation

## 2020-04-14 DIAGNOSIS — N3281 Overactive bladder: Secondary | ICD-10-CM

## 2020-04-14 MED ORDER — IOHEXOL 180 MG/ML  SOLN: 10.0000 mL | Freq: Once | INTRAMUSCULAR | Status: DC

## 2020-04-14 MED ORDER — ROPIVACAINE HCL 2 MG/ML IJ SOLN
9.0000 mL | Freq: Once | INTRAMUSCULAR | Status: AC
  Administered 2020-04-14: 9 mL via PERINEURAL

## 2020-04-14 MED ORDER — LIDOCAINE HCL 2 % IJ SOLN
INTRAMUSCULAR | Status: AC
  Filled 2020-04-14: qty 20

## 2020-04-14 MED ORDER — OXYBUTYNIN CHLORIDE ER 10 MG PO TB24: 10.0000 mg | ORAL_TABLET | Freq: Every day | ORAL | 3 refills | Status: DC

## 2020-04-14 MED ORDER — FENTANYL CITRATE (PF) 100 MCG/2ML IJ SOLN
25.0000 ug | INTRAMUSCULAR | Status: DC | PRN
  Administered 2020-04-14: 50 ug via INTRAVENOUS

## 2020-04-14 MED ORDER — AZITHROMYCIN 250 MG PO TABS: ORAL_TABLET | ORAL | 0 refills | Status: AC

## 2020-04-14 MED ORDER — FENTANYL CITRATE (PF) 100 MCG/2ML IJ SOLN
INTRAMUSCULAR | Status: AC
  Filled 2020-04-14: qty 2

## 2020-04-14 MED ORDER — ROPIVACAINE HCL 2 MG/ML IJ SOLN
INTRAMUSCULAR | Status: AC
  Filled 2020-04-14: qty 10

## 2020-04-14 MED ORDER — LIDOCAINE HCL 2 % IJ SOLN
20.0000 mL | Freq: Once | INTRAMUSCULAR | Status: AC
  Administered 2020-04-14: 400 mg

## 2020-04-14 MED ORDER — DEXAMETHASONE SODIUM PHOSPHATE 10 MG/ML IJ SOLN
10.0000 mg | Freq: Once | INTRAMUSCULAR | Status: AC
  Administered 2020-04-14: 10 mg

## 2020-04-14 MED ORDER — DEXAMETHASONE SODIUM PHOSPHATE 10 MG/ML IJ SOLN
INTRAMUSCULAR | Status: AC
  Filled 2020-04-14: qty 1

## 2020-04-14 NOTE — Progress Notes (Signed)
Patient's Name: Candace Clayton  MRN: [id number]  Referring Provider: Gillis Santa, MD  DOB: 13-Feb-1961  PCP: Ronnell Freshwater, NP  DOS: 04/14/2020  Note by: Gillis Santa, MD  Service setting: Ambulatory outpatient  Specialty: Interventional Pain Management  Patient type: Established  Location: [facility] (AMB) Pain Management Facility  Visit type: Interventional Procedure   Primary Reason for Visit: Interventional Pain Management Treatment. CC: Back Pain (lower)  Procedure:          Anesthesia, Analgesia, Anxiolysis:  Type: Thermal Lumbar Facet, Medial Branch Radiofrequency Ablation/Neurotomy  #2   Primary Purpose: Therapeutic Region: Posterolateral Lumbosacral Spine Level:  L3, L4, L5,  Medial Branch Level(s). These levels will denervate the L3-4, L4-5 lumbar facet joints. Laterality: Right  Type: Moderate (Conscious) Sedation combined with Local Anesthesia Indication(s): Analgesia and Anxiety Route: Intravenous (IV) IV Access: Secured Sedation: Meaningful verbal contact was maintained at all times during the procedure  Local Anesthetic: Lidocaine 1-2%  Position: Prone   Indications: 1. Lumbar facet arthropathy (R>L)   2. Lumbar spondylosis   3. Respiratory illness    Candace Clayton has been dealing with the above chronic pain for longer than three months and has either failed to respond, was unable to tolerate, or simply did not get enough benefit from other more conservative therapies including, but not limited to: 1. Over-the-counter medications 2. Anti-inflammatory medications 3. Muscle relaxants 4. Membrane stabilizers 5. Opioids 6. Physical therapy and/or chiropractic manipulation 7. Modalities (Heat, ice, etc.) 8. Invasive techniques such as nerve blocks. Candace Clayton has attained more than 50% relief of the pain from a series of diagnostic injections conducted in separate occasions.  Pain Score: Pre-procedure: 5 /10 Post-procedure: 0-No pain/10  Pre-op Assessment:  Ms.  Clayton is a 59 y.o. (year old), female patient, seen today for interventional treatment. She  has a past surgical history that includes Cesarean section (1992); Finger surgery (Left, 2011); Hematoma evacuation (Left, 1999); Colonoscopy; Laparoscopic salpingo oophorectomy (Left, 04/02/2018); and Colonoscopy with propofol (N/A, 03/14/2019). Candace Clayton has a current medication list which includes the following prescription(s): albuterol, contour next monitor, buspirone, cetirizine, desvenlafaxine, enalapril, contour next test, hydrochlorothiazide, hydroxyzine, metformin, metoprolol tartrate, microlet lancets, multiple vitamins-minerals, NON FORMULARY, oxycodone-acetaminophen, [START ON 05/06/2020] oxycodone-acetaminophen, [START ON 06/05/2020] oxycodone-acetaminophen, pregabalin, anoro ellipta, azithromycin, OVER THE COUNTER MEDICATION, and oxybutynin, and the following Facility-Administered Medications: fentanyl. Her primarily concern today is the Back Pain (lower)  Initial Vital Signs:  Pulse/HCG Rate: 65ECG Heart Rate: 69 Temp: 98.4 F (36.9 C) Resp: 16 BP: (!) 144/84 SpO2: 100 %  BMI: Estimated body mass index is 47.26 kg/m as calculated from the following:   Height as of this encounter: 5\' 9"  (1.753 m).   Weight as of this encounter: 320 lb (145.2 kg).  Risk Assessment: Allergies: Reviewed. She is allergic to gabapentin.  Allergy Precautions: None required Coagulopathies: Reviewed. None identified.  Blood-thinner therapy: None at this time Active Infection(s): Reviewed. None identified. [name] is afebrile  Site Confirmation: Candace Clayton was asked to confirm the procedure and laterality before marking the site Procedure checklist: Completed Consent: Before the procedure and under the influence of no sedative(s), amnesic(s), or anxiolytics, the patient was informed of the treatment options, risks and possible complications. To fulfill our ethical and legal obligations, as recommended by  the American Medical Association's Code of Ethics, I have informed the patient of my clinical impression; the nature and purpose of the treatment or procedure; the risks, benefits, and possible complications of the intervention; the alternatives, including  doing nothing; the risk(s) and benefit(s) of the alternative treatment(s) or procedure(s); and the risk(s) and benefit(s) of doing nothing. The patient was provided information about the general risks and possible complications associated with the procedure. These may include, but are not limited to: failure to achieve desired goals, infection, bleeding, organ or nerve damage, allergic reactions, paralysis, and death. In addition, the patient was informed of those risks and complications associated to Spine-related procedures, such as failure to decrease pain; infection (i.e.: Meningitis, epidural or intraspinal abscess); bleeding (i.e.: epidural hematoma, subarachnoid hemorrhage, or any other type of intraspinal or peri-dural bleeding); organ or nerve damage (i.e.: Any type of peripheral nerve, nerve root, or spinal cord injury) with subsequent damage to sensory, motor, and/or autonomic systems, resulting in permanent pain, numbness, and/or weakness of one or several areas of the body; allergic reactions; (i.e.: anaphylactic reaction); and/or death. Furthermore, the patient was informed of those risks and complications associated with the medications. These include, but are not limited to: allergic reactions (i.e.: anaphylactic or anaphylactoid reaction(s)); adrenal axis suppression; blood sugar elevation that in diabetics may result in ketoacidosis or comma; water retention that in patients with history of congestive heart failure may result in shortness of breath, pulmonary edema, and decompensation with resultant heart failure; weight gain; swelling or edema; medication-induced neural toxicity; particulate matter embolism and blood vessel occlusion with  resultant organ, and/or nervous system infarction; and/or aseptic necrosis of one or more joints. Finally, the patient was informed that Medicine is not an exact science; therefore, there is also the possibility of unforeseen or unpredictable risks and/or possible complications that may result in a catastrophic outcome. The patient indicated having understood very clearly. We have given the patient no guarantees and we have made no promises. Enough time was given to the patient to ask questions, all of which were answered to the patient's satisfaction. Ms. Brodbeck has indicated that she wanted to continue with the procedure. Attestation: I, the ordering provider, attest that I have discussed with the patient the benefits, risks, side-effects, alternatives, likelihood of achieving goals, and potential problems during recovery for the procedure that I have provided informed consent. Date  Time: 04/14/2020  8:07 AM  Pre-Procedure Preparation:  Monitoring: As per clinic protocol. Respiration, ETCO2, SpO2, BP, heart rate and rhythm monitor placed and checked for adequate function Safety Precautions: Patient was assessed for positional comfort and pressure points before starting the procedure. Time-out: I initiated and conducted the "Time-out" before starting the procedure, as per protocol. The patient was asked to participate by confirming the accuracy of the "Time Out" information. Verification of the correct person, site, and procedure were performed and confirmed by me, the nursing staff, and the patient. "Time-out" conducted as per Joint Commission's Universal Protocol (UP.01.01.01). Time: 0844  Description of Procedure:          Laterality: Right Levels:   L3, L4, L5,Medial Branch Level(s), at the L3-4, L4-5  lumbar facet joints. Area Prepped: Lumbosacral Prepping solution: DuraPrep (Iodine Povacrylex [0.7% available iodine] and Isopropyl Alcohol, 74% w/w) Safety Precautions: Aspiration looking for  blood return was conducted prior to all injections. At no point did we inject any substances, as a needle was being advanced. Before injecting, the patient was told to immediately notify me if she was experiencing any new onset of "ringing in the ears, or metallic taste in the mouth". No attempts were made at seeking any paresthesias. Safe injection practices and needle disposal techniques used. Medications properly checked for expiration dates.  SDV (single dose vial) medications used. After the completion of the procedure, all disposable equipment used was discarded in the proper designated medical waste containers. Local Anesthesia: Protocol guidelines were followed. The patient was positioned over the fluoroscopy table. The area was prepped in the usual manner. The time-out was completed. The target area was identified using fluoroscopy. A 12-in long, straight, sterile hemostat was used with fluoroscopic guidance to locate the targets for each level blocked. Once located, the skin was marked with an approved surgical skin marker. Once all sites were marked, the skin (epidermis, dermis, and hypodermis), as well as deeper tissues (fat, connective tissue and muscle) were infiltrated with a small amount of a short-acting local anesthetic, loaded on a 10cc syringe with a 25G, 1.5-in  Needle. An appropriate amount of time was allowed for local anesthetics to take effect before proceeding to the next step. Local Anesthetic: Lidocaine 2.0% The unused portion of the local anesthetic was discarded in the proper designated containers. Technical explanation of process:  Radiofrequency Ablation (RFA)  L3 Medial Branch Nerve RFA: The target area for the L3 medial branch is at the junction of the postero-lateral aspect of the superior articular process and the superior, posterior, and medial edge of the transverse process of L4. Under fluoroscopic guidance, a Radiofrequency needle was inserted until contact was made with  os over the superior postero-lateral aspect of the pedicular shadow (target area). Sensory and motor testing was conducted to properly adjust the position of the needle. Once satisfactory placement of the needle was achieved, the numbing solution was slowly injected after negative aspiration for blood. 1 mL of the nerve block solution was injected without difficulty or complication. After waiting for at least 3 minutes, the ablation was performed. Once completed, the needle was removed intact. L4 Medial Branch Nerve RFA: The target area for the L4 medial branch is at the junction of the postero-lateral aspect of the superior articular process and the superior, posterior, and medial edge of the transverse process of L5. Under fluoroscopic guidance, a Radiofrequency needle was inserted until contact was made with os over the superior postero-lateral aspect of the pedicular shadow (target area). Sensory and motor testing was conducted to properly adjust the position of the needle. Once satisfactory placement of the needle was achieved, the numbing solution was slowly injected after negative aspiration for blood. 1 mL of the nerve block solution was injected without difficulty or complication. After waiting for at least 3 minutes, the ablation was performed. Once completed, the needle was removed intact. L5 Medial Branch Nerve RFA: The target area for the L5 medial branch is at the junction of the postero-lateral aspect of the superior articular process of S1 and the superior, posterior, and medial edge of the sacral ala. Under fluoroscopic guidance, a Radiofrequency needle was inserted until contact was made with os over the superior postero-lateral aspect of the pedicular shadow (target area). Sensory and motor testing was conducted to properly adjust the position of the needle. Once satisfactory placement of the needle was achieved, the numbing solution was slowly injected after negative aspiration for blood. 74mL  of the nerve block solution was injected without difficulty or complication. After waiting for at least 3 minutes, the ablation was performed. Once completed, the needle was removed intact.  Radiofrequency lesioning (ablation):  Radiofrequency Generator: NeuroTherm [id number] Sensory Stimulation Parameters: 50 Hz was used to locate & identify the nerve, making sure that the needle was positioned such that there was no sensory stimulation below  0.3 V or above 0.7 V. Motor Stimulation Parameters: 2 Hz was used to evaluate the motor component. Care was taken not to lesion any nerves that demonstrated motor stimulation of the lower extremities at an output of less than 2.5 times that of the sensory threshold, or a maximum of 2.0 V. Lesioning Technique Parameters: Standard Radiofrequency settings. (Not bipolar or pulsed.) Temperature Settings: 80 degrees C Lesioning time: 60 seconds Intra-operative Compliance: Compliant Materials & Medications: Needle(s) (Electrode/Cannula) Type: Teflon-coated, curved tip, Radiofrequency needle(s) Gauge: 22G Length: 10cm Numbing solution: 5 cc solution made of 2 cc of 0.2% ropivacaine, 2 cc of Decadron 10 mg/cc.  1 cc injected at each level above on the right after sensorimotor testing prior to lesioning.  The unused portion of the solution was discarded in the proper designated containers.  Once the entire procedure was completed, the treated area was cleaned, making sure to leave some of the prepping solution back to take advantage of its long term bactericidal properties.  Illustration of the posterior view of the lumbar spine and the posterior neural structures. Laminae of L2 through S1 are labeled. DPRL5, dorsal primary ramus of L5; DPRS1, dorsal primary ramus of S1; DPR3, dorsal primary ramus of L3; FJ, facet (zygapophyseal) joint L3-L4; I, inferior articular process of L4; LB1, lateral branch of dorsal primary ramus of L1; IAB, inferior articular branches from L3  medial branch (supplies L4-L5 facet joint); IBP, intermediate branch plexus; MB3, medial branch of dorsal primary ramus of L3; NR3, third lumbar nerve root; S, superior articular process of L5; SAB, superior articular branches from L4 (supplies L4-5 facet joint also); TP3, transverse process of L3.  Vitals:   04/14/20 0900 04/14/20 0910 04/14/20 0920 04/14/20 0931  BP: (!) 150/79 (!) 144/52 (!) 118/59 129/75  Pulse:      Resp: (!) 23 19 18 18   Temp:      TempSrc:      SpO2: 96% 98% 95% 99%  Weight:      Height:        Start Time: 0844 hrs. End Time: 0901 hrs.  Imaging Guidance (Spinal):          Type of Imaging Technique: Fluoroscopy Guidance (Spinal) Indication(s): Assistance in needle guidance and placement for procedures requiring needle placement in or near specific anatomical locations not easily accessible without such assistance. Exposure Time: Please see nurses notes. Contrast: None used. Fluoroscopic Guidance: I was personally present during the use of fluoroscopy. "Tunnel Vision Technique" used to obtain the best possible view of the target area. Parallax error corrected before commencing the procedure. "Direction-depth-direction" technique used to introduce the needle under continuous pulsed fluoroscopy. Once target was reached, antero-posterior, oblique, and lateral fluoroscopic projection used confirm needle placement in all planes. Images permanently stored in EMR. Interpretation: No contrast injected. I personally interpreted the imaging intraoperatively. Adequate needle placement confirmed in multiple planes. Permanent images saved into the patient's record.  Antibiotic Prophylaxis:   Anti-infectives (From admission, onward)   Start     Dose/Rate Route Frequency Ordered Stop   04/14/20 0000  azithromycin (ZITHROMAX Z-PAK) 250 MG tablet           04/14/20 1046 04/19/20 2359     Indication(s): None identified  Post-operative Assessment:  Post-procedure Vital Signs:   Pulse/HCG Rate: 6569 Temp: 98.4 F (36.9 C) Resp: 18 BP: 129/75 SpO2: 99 %  EBL: None  Complications: No immediate post-treatment complications observed by team, or reported by patient.  Note: The patient tolerated the entire  procedure well. A repeat set of vitals were taken after the procedure and the patient was kept under observation following institutional policy, for this type of procedure. Post-procedural neurological assessment was performed, showing return to baseline, prior to discharge. The patient was provided with post-procedure discharge instructions, including a section on how to identify potential problems. Should any problems arise concerning this procedure, the patient was given instructions to immediately contact us, at any time, without hesitation. In any case, we plan to contact the patient by telephone for a follow-up status report regarding this interventional procedure.  Comments:  No additional relevant information.  Plan of Care  Orders:  Orders Placed This Encounter  Procedures  . DG PA[facility] C-ARM 1-60 MIN NO REPORT    Intraoperative interpretation by procedural physician at [place].    Standing Status:   Standing    Number of Occurrences:   1    Order Specific Question:   Reason for exam:    Answer:   Assistance in needle guidance and placement for procedures requiring needle placement in or near specific anatomical locations not easily accessible without such assistance.     Medications ordered for procedure: Meds ordered this encounter  Medications  . DISCONTD: iohexol (OMNIPAQUE) 180 MG/ML injection 10 mL    Must be Myelogram-compatible. If not available, you may substitute with a water-soluble, non-ionic, hypoallergenic, myelogram-compatible radiological contrast medium.  Marland Kitchen lidocaine (XYLOCAINE) 2 % (with pres) injection 400 mg  . fentaNYL (SUBLIMAZE) injection 25-50 mcg    Make sure Narcan is available in the pyxis when using  this medication. In the event of respiratory depression (RR< 8/min): Titrate NARCAN (naloxone) in increments of 0.1 to 0.2 mg IV at 2-3 minute intervals, until desired degree of reversal.  . ropivacaine (PF) 2 mg/mL (0.2%) (NAROPIN) injection 9 mL  . dexamethasone (DECADRON) injection 10 mg  . azithromycin (ZITHROMAX Z-PAK) 250 MG tablet    Sig: Take 2 tablets (500 mg) on  Day 1,  followed by 1 tablet (250 mg) once daily on Days 2 through 5.    Dispense:  6 each    Refill:  0    Patient states that she has a "head cold".  This is been going on for the last 5 days.  Patient has been afebrile but does endorse cough and sputum production.  States that she cannot get in with her primary care provider until Friday and is requesting azithromycin.  Will provide prescription as below, recommend that she follow with her PCP regarding this.  Medications administered: We administered lidocaine, fentaNYL, ropivacaine (PF) 2 mg/mL (0.2%), and dexamethasone.  See the medical record for exact dosing, route, and time of administration.  Follow-up plan:   Return for Keep [facility]. appt, Contra-lateral RFA.      Status post diagnostic bilateral L3, L4, L5, S1 facet medial branch nerve blocks, status post right L3, L4, L5, S1 RFA on 05/05/2019, 04/15/20; return for the left.    Recent Visits Date Type Provider Dept  04/01/20 Office Visit Gillis Santa, MD [facility]  01/27/20 Office Visit Gillis Santa, MD [facility]  Showing recent visits within past 90 days and meeting all other requirements Today's Visits Date Type Provider Dept  04/14/20 Procedure visit Gillis Santa, MD [facility]  Showing today's visits and meeting all other requirements Future Appointments Date Type Provider Dept  05/03/20 Appointment Gillis Santa, MD [facility]  06/29/20 Appointment Gillis Santa, MD [facility]  Showing future appointments within next 90 days and meeting all  other requirements  Disposition: Discharge home  Discharge Date & Time: 04/14/2020; 0930 hrs.   Primary Care Physician: Ronnell Freshwater, NP Location: [facility] Note by: Gillis Santa, MD Date: 04/14/2020; Time: 12:54 PM  Disclaimer:  Medicine is not an exact science. The only guarantee in medicine is that nothing is guaranteed. It is important to note that the decision to proceed with this intervention was based on the information collected from the patient. The Data and conclusions were drawn from the patient's questionnaire, the interview, and the physical examination. Because the information was provided in large part by the patient, it cannot be guaranteed that it has not been purposely or unconsciously manipulated. Every effort has been made to obtain as much relevant data as possible for this evaluation. It is important to note that the conclusions that lead to this procedure are derived in large part from the available data. Always take into account that the treatment will also be dependent on availability of resources and existing treatment guidelines, considered by other Pain Management Practitioners as being common knowledge and practice, at the time of the intervention. For Medico-Legal purposes, it is also important to point out that variation in procedural techniques and pharmacological choices are the acceptable norm. The indications, contraindications, technique, and results of the above procedure should only be interpreted and judged by a Board-Certified Interventional Pain Specialist with extensive familiarity and expertise in the same exact procedure and technique.

## 2020-04-14 NOTE — Patient Instructions (Signed)
Pain Management Discharge Instructions  General Discharge Instructions :  If you need to reach your doctor call: Monday-Friday 8:00 am - 4:00 pm at [phone] or toll free [phone].  After clinic hours [phone] to have operator reach doctor.  Bring all of your medication bottles to all your appointments in the pain clinic.  To cancel or reschedule your appointment with Pain Management please remember to call 24 hours in advance to avoid a fee.  Refer to the educational materials which you have been given on: General Risks, I had my Procedure. Discharge Instructions, Post Sedation.  Post Procedure Instructions:  The drugs you were given will stay in your system until tomorrow, so for the next 24 hours you should not drive, make any legal decisions or drink any alcoholic beverages.  You may eat anything you prefer, but it is better to start with liquids then soups and crackers, and gradually work up to solid foods.  Please notify your doctor immediately if you have any unusual bleeding, trouble breathing or pain that is not related to your normal pain.  Depending on the type of procedure that was done, some parts of your body may feel week and/or numb.  This usually clears up by tonight or the next day.  Walk with the use of an assistive device or accompanied by an adult for the 24 hours.  You may use ice on the affected area for the first 24 hours.  Put ice in a Ziploc bag and cover with a towel and place against area 15 minutes on 15 minutes off.  You may switch to heat after 24 hours.Radiofrequency Lesioning Radiofrequency lesioning is a procedure that is performed to relieve pain. The procedure is often used for back, neck, or arm pain. Radiofrequency lesioning involves the use of a machine that creates radio waves to make heat. During the procedure, the heat is applied to the nerve that carries the pain signal. The heat damages the nerve and interferes with the pain signal.  Pain relief usually starts about 2 weeks after the procedure and lasts for 6 months to 1 year. You will be awake during the procedure. You will need to be able to talk with the health care provider during the procedure. Tell a health care provider about:  Any allergies you have.  All medicines you are taking, including vitamins, herbs, eye drops, creams, and over-the-counter medicines.  Any problems you or family members have had with anesthetic medicines.  Any blood disorders you have.  Any surgeries you have had.  Any medical conditions you have or have had.  Whether you are pregnant or may be pregnant. What are the risks? Generally, this is a safe procedure. However, problems may occur, including:  Pain or soreness at the injection site.  Allergic reaction to medicines given during the procedure.  Bleeding.  Infection at the injection site.  Damage to nerves or blood vessels. What happens before the procedure? Staying hydrated Follow instructions from your health care provider about hydration, which may include:  Up to 2 hours before the procedure - you may continue to drink clear liquids, such as water, clear fruit juice, black coffee, and plain tea. Eating and drinking Follow instructions from your health care provider about eating and drinking, which may include:  8 hours before the procedure - stop eating heavy meals or foods, such as meat, fried foods, or fatty foods.  6 hours before the procedure - stop eating light meals or foods, such as toast  or cereal.  6 hours before the procedure - stop drinking milk or drinks that contain milk.  2 hours before the procedure - stop drinking clear liquids. Medicines Ask your health care provider about:  Changing or stopping your regular medicines. This is especially important if you are taking diabetes medicines or blood thinners.  Taking medicines such as aspirin and ibuprofen. These medicines can thin your blood. Do  not take these medicines unless your health care provider tells you to take them.  Taking over-the-counter medicines, vitamins, herbs, and supplements. General instructions  Plan to have someone take you home from the hospital or clinic.  If you will be going home right after the procedure, plan to have someone with you for 24 hours.  Ask your health care provider what steps will be taken to help prevent infection. These may include: ? Removing hair at the procedure site. ? Washing skin with a germ-killing soap. ? Taking antibiotic medicine. What happens during the procedure?   An IV will be inserted into one of your veins.  You will be given one or more of the following: ? A medicine to help you relax (sedative). ? A medicine to numb the area (local anesthetic).  Your health care provider will insert a radiofrequency needle into the area to be treated. This is done with the help of a type of X-ray (fluoroscopy).  A wire that carries the radio waves (electrode) will be put through the radiofrequency needle.  An electrical pulse will be sent through the electrode to verify the correct nerve that is causing your pain. You will feel a tingling sensation, and you may have muscle twitching.  The tissue around the needle tip will be heated by an electric current that comes from the radiofrequency machine. This will numb the nerves.  The needle will be removed.  A bandage (dressing) will be put on the insertion area. The procedure may vary among health care providers and hospitals. What happens after the procedure?  Your blood pressure, heart rate, breathing rate, and blood oxygen level will be monitored until you leave the hospital or clinic.  Return to your normal activities as told by your health care provider. Ask your health care provider what activities are safe for you.  Do not drive for 24 hours if you were given a sedative during your procedure. Summary  Radiofrequency  lesioning is a procedure that is performed to relieve pain. The procedure is often used for back, neck, or arm pain.  Radiofrequency lesioning involves the use of a machine that creates radio waves to make heat.  Plan to have someone take you home from the hospital or clinic. Do not drive for 24 hours if you were given a sedative during your procedure.  Return to your normal activities as told by your health care provider. Ask your health care provider what activities are safe for you. This information is not intended to replace advice given to you by your health care provider. Make sure you discuss any questions you have with your health care provider. Document Revised: 02/28/2018 Document Reviewed: 02/28/2018 Else[employer] Patient Education  [place].

## 2020-04-14 NOTE — Progress Notes (Signed)
Safety precautions to be maintained throughout the outpatient stay will include: orient to surroundings, keep bed in low position, maintain call bell within reach at all times, provide assistance with transfer out of bed and ambulation.  

## 2020-04-15 ENCOUNTER — Telehealth: Payer: Self-pay

## 2020-04-15 NOTE — Telephone Encounter (Signed)
Post procedure phone call.  LM 

## 2020-04-21 ENCOUNTER — Ambulatory Visit: Payer: BC Managed Care – PPO

## 2020-04-28 ENCOUNTER — Ambulatory Visit: Payer: BC Managed Care – PPO | Admitting: Student in an Organized Health Care Education/Training Program

## 2020-04-28 ENCOUNTER — Ambulatory Visit: Payer: No Typology Code available for payment source

## 2020-04-28 ENCOUNTER — Other Ambulatory Visit: Payer: Self-pay

## 2020-04-28 DIAGNOSIS — G4733 Obstructive sleep apnea (adult) (pediatric): Secondary | ICD-10-CM

## 2020-04-28 NOTE — Progress Notes (Signed)
New Cpap Setup  Ms Hogston was setup on a resmed airsense S-11 at 61 cmH2o with a heated humidifier, tubing, filter and a resmed F-20 full face mask medium. This is her second machine and she has a good understanding of using, cleaning and compliance of cpap.

## 2020-04-28 NOTE — Telephone Encounter (Signed)
Patient was seen and this was addressed.

## 2020-04-29 NOTE — Telephone Encounter (Signed)
Pt seen and this was addressed,

## 2020-05-03 ENCOUNTER — Encounter: Payer: Self-pay | Admitting: Student in an Organized Health Care Education/Training Program

## 2020-05-03 ENCOUNTER — Ambulatory Visit
Admission: RE | Admit: 2020-05-03 | Discharge: 2020-05-03 | Disposition: A | Discharge status: Home / Self Care | Payer: BC Managed Care – PPO | Source: Ambulatory Visit | Attending: Student in an Organized Health Care Education/Training Program | Admitting: Student in an Organized Health Care Education/Training Program

## 2020-05-03 ENCOUNTER — Other Ambulatory Visit: Payer: Self-pay

## 2020-05-03 ENCOUNTER — Ambulatory Visit (HOSPITAL_BASED_OUTPATIENT_CLINIC_OR_DEPARTMENT_OTHER): Payer: BC Managed Care – PPO | Admitting: Student in an Organized Health Care Education/Training Program

## 2020-05-03 DIAGNOSIS — G894 Chronic pain syndrome: Secondary | ICD-10-CM | POA: Insufficient documentation

## 2020-05-03 DIAGNOSIS — M47816 Spondylosis without myelopathy or radiculopathy, lumbar region: Secondary | ICD-10-CM | POA: Diagnosis not present

## 2020-05-03 MED ORDER — METHYLPREDNISOLONE 4 MG PO TBPK: ORAL_TABLET | ORAL | 0 refills | Status: AC

## 2020-05-03 MED ORDER — DEXAMETHASONE SODIUM PHOSPHATE 10 MG/ML IJ SOLN
INTRAMUSCULAR | Status: AC
  Filled 2020-05-03: qty 1

## 2020-05-03 MED ORDER — MIDAZOLAM HCL 5 MG/5ML IJ SOLN
1.0000 mg | INTRAMUSCULAR | Status: DC | PRN
  Administered 2020-05-03: 1 mg via INTRAVENOUS
  Filled 2020-05-03: qty 5

## 2020-05-03 MED ORDER — ROPIVACAINE HCL 2 MG/ML IJ SOLN
INTRAMUSCULAR | Status: AC
  Filled 2020-05-03: qty 10

## 2020-05-03 MED ORDER — FENTANYL CITRATE (PF) 100 MCG/2ML IJ SOLN
25.0000 ug | INTRAMUSCULAR | Status: DC | PRN
  Administered 2020-05-03: 75 ug via INTRAVENOUS

## 2020-05-03 MED ORDER — ROPIVACAINE HCL 2 MG/ML IJ SOLN
9.0000 mL | Freq: Once | INTRAMUSCULAR | Status: AC
  Administered 2020-05-03: 10 mL via PERINEURAL

## 2020-05-03 MED ORDER — LIDOCAINE HCL 2 % IJ SOLN
20.0000 mL | Freq: Once | INTRAMUSCULAR | Status: AC
  Administered 2020-05-03: 400 mg

## 2020-05-03 MED ORDER — LIDOCAINE HCL 2 % IJ SOLN
INTRAMUSCULAR | Status: AC
  Filled 2020-05-03: qty 20

## 2020-05-03 MED ORDER — DEXAMETHASONE SODIUM PHOSPHATE 10 MG/ML IJ SOLN
10.0000 mg | Freq: Once | INTRAMUSCULAR | Status: AC
  Administered 2020-05-03: 10 mg

## 2020-05-03 MED ORDER — FENTANYL CITRATE (PF) 100 MCG/2ML IJ SOLN
INTRAMUSCULAR | Status: AC
  Filled 2020-05-03: qty 2

## 2020-05-03 NOTE — Progress Notes (Signed)
Safety precautions to be maintained throughout the outpatient stay will include: orient to surroundings, keep bed in low position, maintain call bell within reach at all times, provide assistance with transfer out of bed and ambulation.  

## 2020-05-03 NOTE — Progress Notes (Signed)
Patient's Name: Candace Clayton  MRN: [id number]  Referring Provider: Gillis Santa, MD  DOB: Oct 18, 1960  PCP: Ronnell Freshwater, NP  DOS: 05/03/2020  Note by: Gillis Santa, MD  Service setting: Ambulatory outpatient  Specialty: Interventional Pain Management  Patient type: Established  Location: [facility] (AMB) Pain Management Facility  Visit type: Interventional Procedure   Primary Reason for Visit: Interventional Pain Management Treatment. CC: Back Pain (lumbar left )  Procedure:          Anesthesia, Analgesia, Anxiolysis:  Type: Thermal Lumbar Facet, Medial Branch Radiofrequency Ablation/Neurotomy  #1   Primary Purpose: Therapeutic Region: Posterolateral Lumbosacral Spine Level:  L3, L4, L5,  Medial Branch Level(s). These levels will denervate the L3-4, L4-5 lumbar facet joints. Laterality: Left  Type: Moderate (Conscious) Sedation combined with Local Anesthesia Indication(s): Analgesia and Anxiety Route: Intravenous (IV) IV Access: Secured Sedation: Meaningful verbal contact was maintained at all times during the procedure  Local Anesthetic: Lidocaine 1-2%  Position: Prone   Indications: 1. Chronic pain syndrome   2. Lumbar facet arthropathy (R>L)   3. Lumbar spondylosis    Candace Clayton has been dealing with the above chronic pain for longer than three months and has either failed to respond, was unable to tolerate, or simply did not get enough benefit from other more conservative therapies including, but not limited to: 1. Over-the-counter medications 2. Anti-inflammatory medications 3. Muscle relaxants 4. Membrane stabilizers 5. Opioids 6. Physical therapy and/or chiropractic manipulation 7. Modalities (Heat, ice, etc.) 8. Invasive techniques such as nerve blocks. Candace Clayton has attained more than 50% relief of the pain from a series of diagnostic injections conducted in separate occasions.  Pain Score: Pre-procedure: 5 /10 Post-procedure: 0-No pain/10  Pre-op Assessment:    Candace Clayton is a 59 y.o. (year old), female patient, seen today for interventional treatment. She  has a past surgical history that includes Cesarean section (1992); Finger surgery (Left, 2011); Hematoma evacuation (Left, 1999); Colonoscopy; Laparoscopic salpingo oophorectomy (Left, 04/02/2018); and Colonoscopy with propofol (N/A, 03/14/2019). Candace Clayton has a current medication list which includes the following prescription(s): albuterol, contour next monitor, buspirone, cetirizine, desvenlafaxine, enalapril, contour next test, hydrochlorothiazide, hydroxyzine, metformin, metoprolol tartrate, microlet lancets, multiple vitamins-minerals, NON FORMULARY, oxybutynin, oxycodone-acetaminophen, [START ON 05/06/2020] oxycodone-acetaminophen, [START ON 06/05/2020] oxycodone-acetaminophen, pregabalin, anoro ellipta, methylprednisolone, and OVER THE COUNTER MEDICATION, and the following Facility-Administered Medications: fentanyl and midazolam. Her primarily concern today is the Back Pain (lumbar left )  Initial Vital Signs:  Pulse/HCG Rate: (!) 51ECG Heart Rate: 60 Temp: 97.6 F (36.4 C) Resp: 16 BP: (!) 158/76 SpO2: 100 %  BMI: Estimated body mass index is 47.26 kg/m as calculated from the following:   Height as of this encounter: 5\' 9"  (1.753 m).   Weight as of this encounter: 320 lb (145.2 kg).  Risk Assessment: Allergies: Reviewed. She is allergic to gabapentin.  Allergy Precautions: None required Coagulopathies: Reviewed. None identified.  Blood-thinner therapy: None at this time Active Infection(s): Reviewed. None identified. Candace Clayton is afebrile  Site Confirmation: Candace Clayton was asked to confirm the procedure and laterality before marking the site Procedure checklist: Completed Consent: Before the procedure and under the influence of no sedative(s), amnesic(s), or anxiolytics, the patient was informed of the treatment options, risks and possible complications. To fulfill our ethical and  legal obligations, as recommended by the American Medical Association's Code of Ethics, I have informed the patient of my clinical impression; the nature and purpose of the treatment or procedure; the risks, benefits,  and possible complications of the intervention; the alternatives, including doing nothing; the risk(s) and benefit(s) of the alternative treatment(s) or procedure(s); and the risk(s) and benefit(s) of doing nothing. The patient was provided information about the general risks and possible complications associated with the procedure. These may include, but are not limited to: failure to achieve desired goals, infection, bleeding, organ or nerve damage, allergic reactions, paralysis, and death. In addition, the patient was informed of those risks and complications associated to Spine-related procedures, such as failure to decrease pain; infection (i.e.: Meningitis, epidural or intraspinal abscess); bleeding (i.e.: epidural hematoma, subarachnoid hemorrhage, or any other type of intraspinal or peri-dural bleeding); organ or nerve damage (i.e.: Any type of peripheral nerve, nerve root, or spinal cord injury) with subsequent damage to sensory, motor, and/or autonomic systems, resulting in permanent pain, numbness, and/or weakness of one or several areas of the body; allergic reactions; (i.e.: anaphylactic reaction); and/or death. Furthermore, the patient was informed of those risks and complications associated with the medications. These include, but are not limited to: allergic reactions (i.e.: anaphylactic or anaphylactoid reaction(s)); adrenal axis suppression; blood sugar elevation that in diabetics may result in ketoacidosis or comma; water retention that in patients with history of congestive heart failure may result in shortness of breath, pulmonary edema, and decompensation with resultant heart failure; weight gain; swelling or edema; medication-induced neural toxicity; particulate matter  embolism and blood vessel occlusion with resultant organ, and/or nervous system infarction; and/or aseptic necrosis of one or more joints. Finally, the patient was informed that Medicine is not an exact science; therefore, there is also the possibility of unforeseen or unpredictable risks and/or possible complications that may result in a catastrophic outcome. The patient indicated having understood very clearly. We have given the patient no guarantees and we have made no promises. Enough time was given to the patient to ask questions, all of which were answered to the patient's satisfaction. Ms. Broadwell has indicated that she wanted to continue with the procedure. Attestation: I, the ordering provider, attest that I have discussed with the patient the benefits, risks, side-effects, alternatives, likelihood of achieving goals, and potential problems during recovery for the procedure that I have provided informed consent. Date  Time: 05/03/2020  7:58 AM  Pre-Procedure Preparation:  Monitoring: As per clinic protocol. Respiration, ETCO2, SpO2, BP, heart rate and rhythm monitor placed and checked for adequate function Safety Precautions: Patient was assessed for positional comfort and pressure points before starting the procedure. Time-out: I initiated and conducted the "Time-out" before starting the procedure, as per protocol. The patient was asked to participate by confirming the accuracy of the "Time Out" information. Verification of the correct person, site, and procedure were performed and confirmed by me, the nursing staff, and the patient. "Time-out" conducted as per Joint Commission's Universal Protocol (UP.01.01.01). Time: 2202  Description of Procedure:          Laterality: Left Levels:   L3, L4, L5,Medial Branch Level(s), at the L3-4, L4-5  lumbar facet joints. Area Prepped: Lumbosacral Prepping solution: DuraPrep (Iodine Povacrylex [0.7% available iodine] and Isopropyl Alcohol, 74%  w/w) Safety Precautions: Aspiration looking for blood return was conducted prior to all injections. At no point did we inject any substances, as a needle was being advanced. Before injecting, the patient was told to immediately notify me if she was experiencing any new onset of "ringing in the ears, or metallic taste in the mouth". No attempts were made at seeking any paresthesias. Safe injection practices and needle  disposal techniques used. Medications properly checked for expiration dates. SDV (single dose vial) medications used. After the completion of the procedure, all disposable equipment used was discarded in the proper designated medical waste containers. Local Anesthesia: Protocol guidelines were followed. The patient was positioned over the fluoroscopy table. The area was prepped in the usual manner. The time-out was completed. The target area was identified using fluoroscopy. A 12-in long, straight, sterile hemostat was used with fluoroscopic guidance to locate the targets for each level blocked. Once located, the skin was marked with an approved surgical skin marker. Once all sites were marked, the skin (epidermis, dermis, and hypodermis), as well as deeper tissues (fat, connective tissue and muscle) were infiltrated with a small amount of a short-acting local anesthetic, loaded on a 10cc syringe with a 25G, 1.5-in  Needle. An appropriate amount of time was allowed for local anesthetics to take effect before proceeding to the next step. Local Anesthetic: Lidocaine 2.0% The unused portion of the local anesthetic was discarded in the proper designated containers. Technical explanation of process:  Radiofrequency Ablation (RFA)  L3 Medial Branch Nerve RFA: The target area for the L3 medial branch is at the junction of the postero-lateral aspect of the superior articular process and the superior, posterior, and medial edge of the transverse process of L4. Under fluoroscopic guidance, a Radiofrequency  needle was inserted until contact was made with os over the superior postero-lateral aspect of the pedicular shadow (target area). Sensory and motor testing was conducted to properly adjust the position of the needle. Once satisfactory placement of the needle was achieved, the numbing solution was slowly injected after negative aspiration for blood. 1 mL of the nerve block solution was injected without difficulty or complication. After waiting for at least 3 minutes, the ablation was performed. Once completed, the needle was removed intact. L4 Medial Branch Nerve RFA: The target area for the L4 medial branch is at the junction of the postero-lateral aspect of the superior articular process and the superior, posterior, and medial edge of the transverse process of L5. Under fluoroscopic guidance, a Radiofrequency needle was inserted until contact was made with os over the superior postero-lateral aspect of the pedicular shadow (target area). Sensory and motor testing was conducted to properly adjust the position of the needle. Once satisfactory placement of the needle was achieved, the numbing solution was slowly injected after negative aspiration for blood. 1 mL of the nerve block solution was injected without difficulty or complication. After waiting for at least 3 minutes, the ablation was performed. Once completed, the needle was removed intact. L5 Medial Branch Nerve RFA: The target area for the L5 medial branch is at the junction of the postero-lateral aspect of the superior articular process of S1 and the superior, posterior, and medial edge of the sacral ala. Under fluoroscopic guidance, a Radiofrequency needle was inserted until contact was made with os over the superior postero-lateral aspect of the pedicular shadow (target area). Sensory and motor testing was conducted to properly adjust the position of the needle. Once satisfactory placement of the needle was achieved, the numbing solution was slowly  injected after negative aspiration for blood. 78mL of the nerve block solution was injected without difficulty or complication. After waiting for at least 3 minutes, the ablation was performed. Once completed, the needle was removed intact.  Radiofrequency lesioning (ablation):  Radiofrequency Generator: NeuroTherm [id number] Sensory Stimulation Parameters: 50 Hz was used to locate & identify the nerve, making sure that the needle was  positioned such that there was no sensory stimulation below 0.3 V or above 0.7 V. Motor Stimulation Parameters: 2 Hz was used to evaluate the motor component. Care was taken not to lesion any nerves that demonstrated motor stimulation of the lower extremities at an output of less than 2.5 times that of the sensory threshold, or a maximum of 2.0 V. Lesioning Technique Parameters: Standard Radiofrequency settings. (Not bipolar or pulsed.) Temperature Settings: 80 degrees C Lesioning time: 60 seconds Intra-operative Compliance: Compliant Materials & Medications: Needle(s) (Electrode/Cannula) Type: Teflon-coated, curved tip, Radiofrequency needle(s) Gauge: 22G Length: 10cm Numbing solution: 5 cc solution made of 2 cc of 0.2% ropivacaine, 2 cc of Decadron 10 mg/cc.  1 cc injected at each level above on the right after sensorimotor testing prior to lesioning.  The unused portion of the solution was discarded in the proper designated containers.  Once the entire procedure was completed, the treated area was cleaned, making sure to leave some of the prepping solution back to take advantage of its long term bactericidal properties.  Illustration of the posterior view of the lumbar spine and the posterior neural structures. Laminae of L2 through S1 are labeled. DPRL5, dorsal primary ramus of L5; DPRS1, dorsal primary ramus of S1; DPR3, dorsal primary ramus of L3; FJ, facet (zygapophyseal) joint L3-L4; I, inferior articular process of L4; LB1, lateral branch of dorsal primary ramus  of L1; IAB, inferior articular branches from L3 medial branch (supplies L4-L5 facet joint); IBP, intermediate branch plexus; MB3, medial branch of dorsal primary ramus of L3; NR3, third lumbar nerve root; S, superior articular process of L5; SAB, superior articular branches from L4 (supplies L4-5 facet joint also); TP3, transverse process of L3.  Vitals:   05/03/20 0859 05/03/20 0910 05/03/20 0920 05/03/20 0931  BP: (!) 189/97 105/88 131/84 130/84  Pulse:      Resp: 20 16 (!) 22 20  Temp:  (!) 97.4 F (36.3 C)  (!) 97.4 F (36.3 C)  TempSrc:      SpO2: 95% 100% 100% 100%  Weight:      Height:        Start Time: 0833 hrs. End Time: 0859 hrs.  Imaging Guidance (Spinal):          Type of Imaging Technique: Fluoroscopy Guidance (Spinal) Indication(s): Assistance in needle guidance and placement for procedures requiring needle placement in or near specific anatomical locations not easily accessible without such assistance. Exposure Time: Please see nurses notes. Contrast: None used. Fluoroscopic Guidance: I was personally present during the use of fluoroscopy. "Tunnel Vision Technique" used to obtain the best possible view of the target area. Parallax error corrected before commencing the procedure. "Direction-depth-direction" technique used to introduce the needle under continuous pulsed fluoroscopy. Once target was reached, antero-posterior, oblique, and lateral fluoroscopic projection used confirm needle placement in all planes. Images permanently stored in EMR. Interpretation: No contrast injected. I personally interpreted the imaging intraoperatively. Adequate needle placement confirmed in multiple planes. Permanent images saved into the patient's record.  Antibiotic Prophylaxis:   Anti-infectives (From admission, onward)   None     Indication(s): None identified  Post-operative Assessment:  Post-procedure Vital Signs:  Pulse/HCG Rate: (!) 5167 Temp: (!) 97.4 F (36.3 C) Resp:  20 BP: 130/84 SpO2: 100 %  EBL: None  Complications: No immediate post-treatment complications observed by team, or reported by patient.  Note: The patient tolerated the entire procedure well. A repeat set of vitals were taken after the procedure and the patient was kept  under observation following institutional policy, for this type of procedure. Post-procedural neurological assessment was performed, showing return to baseline, prior to discharge. The patient was provided with post-procedure discharge instructions, including a section on how to identify potential problems. Should any problems arise concerning this procedure, the patient was given instructions to immediately contact us, at any time, without hesitation. In any case, we plan to contact the patient by telephone for a follow-up status report regarding this interventional procedure.  Comments:  No additional relevant information.  Plan of Care  Orders:  Orders Placed This Encounter  Procedures  . [facility] C-ARM 1-60 MIN NO REPORT    Intraoperative interpretation by procedural physician at [place].    Standing Status:   Standing    Number of Occurrences:   1    Order Specific Question:   Reason for exam:    Answer:   Assistance in needle guidance and placement for procedures requiring needle placement in or near specific anatomical locations not easily accessible without such assistance.   Patient states that she is having increased pain on her right side of her lumbar spine.  She is status post right L3, L4, L5 radiofrequency ablation approximately 2 weeks ago.  She states that she is having increased pain when she wakes up.  This is unlike her postprocedural experience after her first lumbar radiofrequency ablation on the right side.  She could be experiencing post ablation neuritis.  Recommend Medrol Dosepak as below.  Medications ordered for procedure: Meds ordered this encounter  Medications  .  lidocaine (XYLOCAINE) 2 % (with pres) injection 400 mg  . midazolam (VERSED) 5 MG/5ML injection 1-2 mg    Make sure Flumazenil is available in the pyxis when using this medication. If oversedation occurs, administer 0.2 mg IV over 15 sec. If after 45 sec no response, administer 0.2 mg again over 1 min; may repeat at 1 min intervals; not to exceed 4 doses (1 mg)  . fentaNYL (SUBLIMAZE) injection 25-50 mcg    Make sure Narcan is available in the pyxis when using this medication. In the event of respiratory depression (RR< 8/min): Titrate NARCAN (naloxone) in increments of 0.1 to 0.2 mg IV at 2-3 minute intervals, until desired degree of reversal.  . ropivacaine (PF) 2 mg/mL (0.2%) (NAROPIN) injection 9 mL  . dexamethasone (DECADRON) injection 10 mg  . methylPREDNISolone (MEDROL) 4 MG TBPK tablet    Sig: Follow package instructions.    Dispense:  21 tablet    Refill:  0    Do not add to the "Automatic Refill" notification system.   Medications administered: We administered lidocaine, midazolam, fentaNYL, ropivacaine (PF) 2 mg/mL (0.2%), and dexamethasone.  See the medical record for exact dosing, route, and time of administration.  Follow-up plan:   Return for Keep sch. appt.      Status post diagnostic bilateral L3, L4, L5, S1 facet medial branch nerve blocks, status post right L3, L4, L5, S1 RFA on 05/05/2019, 04/15/20; left L3, L4, L5 RFA 05/03/2020.    Recent Visits Date Type Provider Dept  04/14/20 Procedure visit Gillis Santa, MD [facility]  04/01/20 Office Visit Gillis Santa, MD [facility]  Showing recent visits within past 90 days and meeting all other requirements Today's Visits Date Type Provider Dept  05/03/20 Procedure visit Gillis Santa, MD [facility]  Showing today's visits and meeting all other requirements Future Appointments Date Type Provider Dept  06/29/20 Appointment Gillis Santa, MD  [facility]  Showing future  appointments within next 90 days and meeting all other requirements  Disposition: Discharge home  Discharge Date & Time: 05/03/2020; 0932 hrs.   Primary Care Physician: Ronnell Freshwater, NP Location: [facility] Note by: Gillis Santa, MD Date: 05/03/2020; Time: 9:50 AM  Disclaimer:  Medicine is not an exact science. The only guarantee in medicine is that nothing is guaranteed. It is important to note that the decision to proceed with this intervention was based on the information collected from the patient. The Data and conclusions were drawn from the patient's questionnaire, the interview, and the physical examination. Because the information was provided in large part by the patient, it cannot be guaranteed that it has not been purposely or unconsciously manipulated. Every effort has been made to obtain as much relevant data as possible for this evaluation. It is important to note that the conclusions that lead to this procedure are derived in large part from the available data. Always take into account that the treatment will also be dependent on availability of resources and existing treatment guidelines, considered by other Pain Management Practitioners as being common knowledge and practice, at the time of the intervention. For Medico-Legal purposes, it is also important to point out that variation in procedural techniques and pharmacological choices are the acceptable norm. The indications, contraindications, technique, and results of the above procedure should only be interpreted and judged by a Board-Certified Interventional Pain Specialist with extensive familiarity and expertise in the same exact procedure and technique.

## 2020-05-04 ENCOUNTER — Telehealth: Payer: Self-pay

## 2020-05-04 NOTE — Telephone Encounter (Signed)
No answer, left message to callif needed.

## 2020-05-05 DIAGNOSIS — G4733 Obstructive sleep apnea (adult) (pediatric): Secondary | ICD-10-CM | POA: Diagnosis not present

## 2020-05-15 NOTE — Progress Notes (Signed)
Patient was no-show for appointment.  The office staff will contact the patient for rescheduling follow-up. 

## 2020-05-17 ENCOUNTER — Other Ambulatory Visit: Payer: Self-pay

## 2020-05-17 ENCOUNTER — Ambulatory Visit (INDEPENDENT_AMBULATORY_CARE_PROVIDER_SITE_OTHER): Payer: No Typology Code available for payment source | Admitting: Nurse Practitioner

## 2020-05-17 ENCOUNTER — Encounter: Payer: Self-pay | Admitting: Nurse Practitioner

## 2020-05-17 VITALS — BP 148/82 | HR 99 | Temp 98.0°F | Resp 16 | Ht 69.0 in | Wt 344.0 lb

## 2020-05-17 DIAGNOSIS — E1169 Type 2 diabetes mellitus with other specified complication: Secondary | ICD-10-CM | POA: Insufficient documentation

## 2020-05-17 DIAGNOSIS — J300 Vasomotor rhinitis: Secondary | ICD-10-CM | POA: Diagnosis not present

## 2020-05-17 DIAGNOSIS — E1165 Type 2 diabetes mellitus with hyperglycemia: Secondary | ICD-10-CM

## 2020-05-17 DIAGNOSIS — J0140 Acute pansinusitis, unspecified: Secondary | ICD-10-CM

## 2020-05-17 DIAGNOSIS — R300 Dysuria: Secondary | ICD-10-CM

## 2020-05-17 DIAGNOSIS — N3281 Overactive bladder: Secondary | ICD-10-CM

## 2020-05-17 DIAGNOSIS — I10 Essential (primary) hypertension: Secondary | ICD-10-CM

## 2020-05-17 DIAGNOSIS — Z0001 Encounter for general adult medical examination with abnormal findings: Secondary | ICD-10-CM | POA: Diagnosis not present

## 2020-05-17 DIAGNOSIS — E782 Mixed hyperlipidemia: Secondary | ICD-10-CM

## 2020-05-17 DIAGNOSIS — Z79899 Other long term (current) drug therapy: Secondary | ICD-10-CM | POA: Insufficient documentation

## 2020-05-17 DIAGNOSIS — Z1231 Encounter for screening mammogram for malignant neoplasm of breast: Secondary | ICD-10-CM

## 2020-05-17 DIAGNOSIS — E559 Vitamin D deficiency, unspecified: Secondary | ICD-10-CM

## 2020-05-17 HISTORY — DX: Acute pansinusitis, unspecified: J01.40

## 2020-05-17 LAB — POCT GLYCOSYLATED HEMOGLOBIN (HGB A1C): Hemoglobin A1C: 7.2 % — AB (ref 4.0–5.6)

## 2020-05-17 MED ORDER — FLUTICASONE PROPIONATE 50 MCG/ACT NA SUSP: 2.0000 | Freq: Every day | NASAL | 6 refills | Status: DC

## 2020-05-17 MED ORDER — ROSUVASTATIN CALCIUM 5 MG PO TABS: 5.0000 mg | ORAL_TABLET | Freq: Every day | ORAL | 1 refills | Status: DC

## 2020-05-17 MED ORDER — ERGOCALCIFEROL 1.25 MG (50000 UT) PO CAPS: 50000.0000 [IU] | ORAL_CAPSULE | ORAL | 5 refills | Status: DC

## 2020-05-17 MED ORDER — SULFAMETHOXAZOLE-TRIMETHOPRIM 800-160 MG PO TABS: 1.0000 | ORAL_TABLET | Freq: Two times a day (BID) | ORAL | 0 refills | Status: DC

## 2020-05-17 NOTE — Progress Notes (Signed)
[facility] [place], [place] [address]  Internal MEDICINE  Office Visit Note  Patient Name: Candace Clayton  [id number]  [id number]  Date of Service: 05/17/2020  Chief Complaint  Patient presents with  . Annual Exam  . Quality Metric Gaps    mammogram,flu,eye exam,foot exam  . controlled substance form    reviewed with PT     The patient is here for health maintenance exam. She states that she has had significant sinus congestion for about a month. She states that her ears would hurt some as well. Denies headache or fever. Mucus is green color. Denies nausea, vomiting, or diarrhea. Did take z-pack a few weeks ago. States that this really did not help. Still able to smell. Taste sensation is diminished. Does not believe she has bee around anyone who has had COVID 19.  She continues to have significantly overactive bladder. No evidence of infection. Have tried multiple medications to help and they have not made anything better. Ultrasound and CT studies of abdomen and pelvis have shown normal appearing kidneys and bladder.  Blood sugars are some elevated today. Her HgbA1c is 7.4 today, up from 6.6 at her last check. She has had several procedures on her back which involve injection of corticosteroids. Blood sugar went as high as 345 after the most recent injection she got.  Had routine, fasting labs done prior to this visit. Lipid panel mild/moderatelyelevated. Currently not on statin therapy. Low vitamin d level also present.   Pt is here for routine health maintenance examination  Current Medication: Outpatient Encounter Medications as of 05/17/2020  Medication Sig  . albuterol (VENTOLIN HFA) 108 (90 Base) MCG/ACT inhaler TAKE 2 PUFFS BY MOUTH EVERY 6 HOURS AS NEEDED FOR WHEEZE OR SHORTNESS OF BREATH  . Blood Glucose Monitoring Suppl (CONTOUR NEXT MONITOR) w/Device KIT 1 each by Does not apply route daily. DX-E11.65  . busPIRone (BUSPAR) 15 MG tablet Take 1  tablet (15 mg total) by mouth 3 (three) times daily. Take one tab po bid for one week, then increase to tid for anxiety  . cetirizine (ZYRTEC) 10 MG tablet TAKE 1 TABLET(S) BY MOUTH DAILY FOR ALLERGIES  . desvenlafaxine (PRISTIQ) 100 MG 24 hr tablet Take 1 tablet (100 mg total) by mouth daily.  . enalapril (VASOTEC) 20 MG tablet Take 1 tablet (20 mg total) by mouth 2 (two) times daily.  Marland Kitchen glucose blood (CONTOUR NEXT TEST) test strip 1 each by Other route daily. DX E11.65  . hydrochlorothiazide (HYDRODIURIL) 25 MG tablet Take 1 tablet (25 mg total) by mouth daily.  . hydrOXYzine (ATARAX/VISTARIL) 10 MG tablet Take 1 tablet (10 mg total) by mouth 3 (three) times daily as needed.  . metFORMIN (GLUCOPHAGE) 500 MG tablet Take 1 tablet (500 mg total) by mouth 2 (two) times daily with a meal.  . metoprolol tartrate (LOPRESSOR) 25 MG tablet Take 2 tablets (50 mg total) by mouth 2 (two) times daily.  . Microlet Lancets MISC 1 each by Does not apply route daily. DX-E11.65  . Multiple Vitamins-Calcium (ONE-A-DAY WOMENS PO) Take 1 tablet by mouth daily.  . NON FORMULARY cpap device  . OVER THE COUNTER MEDICATION 1 tablet daily as needed. Takes generic stool softener for constipation d/t oxycodone.   Marland Kitchen oxybutynin (DITROPAN-XL) 10 MG 24 hr tablet Take 1 tablet (10 mg total) by mouth at bedtime.  Marland Kitchen oxyCODONE-acetaminophen (PERCOCET/ROXICET) 5-325 MG tablet Take 1 tablet by mouth every 8 (eight) hours as needed for severe  pain.  . [START ON 06/05/2020] oxyCODONE-acetaminophen (PERCOCET/ROXICET) 5-325 MG tablet Take 1 tablet by mouth every 8 (eight) hours as needed for severe pain.  . pregabalin (LYRICA) 50 MG capsule 50 mg during day, 100 mg qhs  . umeclidinium-vilanterol (ANORO ELLIPTA) 62.5-25 MCG/INH AEPB Inhale 1 puff into the lungs daily.  . fluticasone (FLONASE) 50 MCG/ACT nasal spray Place 2 sprays into both nostrils daily.  Marland Kitchen oxyCODONE-acetaminophen (PERCOCET/ROXICET) 5-325 MG tablet Take 1 tablet by  mouth every 8 (eight) hours as needed for severe pain.  . rosuvastatin (CRESTOR) 5 MG tablet Take 1 tablet (5 mg total) by mouth daily.  Marland Kitchen sulfamethoxazole-trimethoprim (BACTRIM DS) 800-160 MG tablet Take 1 tablet by mouth 2 (two) times daily.   No facility-administered encounter medications on file as of 05/17/2020.    Surgical History: Past Surgical History:  Procedure Laterality Date  . CESAREAN SECTION  1992  . COLONOSCOPY     polyps removed first procedure. 2nd time all was clear  . COLONOSCOPY WITH PROPOFOL N/A 03/14/2019   Procedure: COLONOSCOPY WITH PROPOFOL;  Surgeon: Lin Landsman, MD;  Location: [facility] ENDOSCOPY;  Service: Gastroenterology;  Laterality: N/A;  . FINGER SURGERY Left 2011   left finger cut off x 2.(only up to last digit)  . HEMATOMA EVACUATION Left 1999   upper part of foot was injured d/t 500lb weight landing on her foot.   Marland Kitchen LAPAROSCOPIC SALPINGO OOPHERECTOMY Left 04/02/2018   Procedure: LAPAROSCOPIC SALPINGO OOPHORECTOMY;  Surgeon: Malachy Mood, MD;  Location: [facility] ORS;  Service: Gynecology;  Laterality: Left;    Medical History: Past Medical History:  Diagnosis Date  . Anginal pain (HCC)    tightness related to anxiety  . Anxiety   . Depression   . Diabetes mellitus without complication ([place])   . GERD (gastroesophageal reflux disease)    throws up easily but not diagnosed with reflux  . History of C-section 5  . Hypertension   . Sleep apnea    uses cpap    Family History: Family History  Problem Relation Age of Onset  . Breast cancer Mother 40       x 3 times  . Colon cancer Mother   . Brain cancer Mother   . Cancer - Colon Mother   . Cancer - Other Mother   . Heart Problems Father   . Colon cancer Brother   . Heart Problems Brother       Review of Systems  Constitutional: Positive for activity change and fatigue. Negative for chills and unexpected weight change.  HENT: Positive for congestion, postnasal drip, rhinorrhea  and sinus pressure. Negative for sneezing and sore throat.   Respiratory: Positive for cough and shortness of breath. Negative for chest tightness.   Cardiovascular: Negative for chest pain and palpitations.       Mildly elevated blood pressure today  Gastrointestinal: Negative for abdominal pain, constipation, diarrhea, nausea and vomiting.  Endocrine: Negative for cold intolerance, heat intolerance, polydipsia and polyuria.       Blood sugars have been elevated recently.   Genitourinary: Positive for frequency and urgency. Negative for dysuria.  Musculoskeletal: Positive for arthralgias, back pain and myalgias. Negative for joint swelling and neck pain.       The patient sees pain management provider routinely.   Skin: Negative for rash.  Allergic/Immunologic: Positive for environmental allergies.  Neurological: Positive for dizziness and headaches. Negative for tremors and numbness.  Hematological: Negative for adenopathy. Does not bruise/bleed easily.  Psychiatric/Behavioral: Positive for dysphoric  mood. Negative for behavioral problems (Depression), sleep disturbance and suicidal ideas. The patient is nervous/anxious.        Well managed on current medication.     Today's Vitals   05/17/20 0902  BP: (!) 148/82  Pulse: 99  Resp: 16  Temp: 98 F (36.7 C)  SpO2: 99%  Weight: (!) 344 lb (156 kg)  Height: 5' 9"  (1.753 m)   Body mass index is 50.8 kg/m.  Physical Exam Vitals and nursing note reviewed.  Constitutional:      General: She is not in acute distress.    Appearance: Normal appearance. She is well-developed. She is obese. She is not diaphoretic.  HENT:     Head: Normocephalic and atraumatic.     Right Ear: Tympanic membrane is erythematous and bulging.     Left Ear: Tympanic membrane is erythematous and bulging.     Nose: Congestion present.     Right Sinus: Frontal sinus tenderness present.     Left Sinus: Frontal sinus tenderness present.     Mouth/Throat:      Pharynx: No oropharyngeal exudate.  Eyes:     Pupils: Pupils are equal, round, and reactive to light.  Neck:     Thyroid: No thyromegaly.     Vascular: No carotid bruit or JVD.     Trachea: No tracheal deviation.  Cardiovascular:     Rate and Rhythm: Normal rate and regular rhythm.     Pulses:          Dorsalis pedis pulses are 1+ on the right side and 1+ on the left side.       Posterior tibial pulses are 1+ on the right side and 1+ on the left side.     Heart sounds: Normal heart sounds. No murmur heard.  No friction rub. No gallop.   Pulmonary:     Effort: Pulmonary effort is normal. No respiratory distress.     Breath sounds: Normal breath sounds. No wheezing or rales.  Chest:     Chest wall: No tenderness.  Abdominal:     General: Bowel sounds are normal.     Palpations: Abdomen is soft.     Tenderness: There is no abdominal tenderness.  Musculoskeletal:        General: Normal range of motion.     Cervical back: Normal range of motion and neck supple.     Right foot: Normal range of motion. No deformity or bunion.     Left foot: Normal range of motion. No deformity or bunion.  Feet:     Right foot:     Protective Sensation: 10 sites tested. 10 sites sensed.     Skin integrity: Skin integrity normal.     Toenail Condition: Right toenails are normal.     Left foot:     Protective Sensation: 10 sites tested. 10 sites sensed.     Skin integrity: Skin integrity normal.     Toenail Condition: Left toenails are normal.     Comments: Some numbness across the distal, plantar aspect of the toes of both feet.  Lymphadenopathy:     Cervical: No cervical adenopathy.  Skin:    General: Skin is warm and dry.  Neurological:     General: No focal deficit present.     Mental Status: She is alert and oriented to person, place, and time.     Cranial Nerves: No cranial nerve deficit.  Psychiatric:        Mood and Affect:  Mood normal.        Behavior: Behavior normal.        Thought  Content: Thought content normal.        Judgment: Judgment normal.      LABS: Recent Results (from the past 2160 hour(s))  Comprehensive metabolic panel     Status: Abnormal   Collection Time: 02/23/20  1:12 PM  Result Value Ref Range   Glucose 132 (H) 65 - 99 mg/dL   BUN 16 6 - 24 mg/dL   Creatinine, Ser 0.72 0.57 - 1.00 mg/dL   GFR calc non Af Amer 92 >59 mL/min/1.73   GFR calc Af Amer 106 >59 mL/min/1.73    Comment: **[facility] currently reports eGFR in compliance with the current**   recommendations of the [employer]. [facility] will   update reporting as new guidelines are published from the NKF-ASN   Task force.    BUN/Creatinine Ratio 22 9 - 23   Sodium 141 134 - 144 mmol/L   Potassium 5.0 3.5 - 5.2 mmol/L   Chloride 103 96 - 106 mmol/L   CO2 22 20 - 29 mmol/L   Calcium 9.9 8.7 - 10.2 mg/dL   Total Protein 6.8 6.0 - 8.5 g/dL   Albumin 4.2 3.8 - 4.9 g/dL   Globulin, Total 2.6 1.5 - 4.5 g/dL   Albumin/Globulin Ratio 1.6 1.2 - 2.2   Bilirubin Total 0.3 0.0 - 1.2 mg/dL   Alkaline Phosphatase 103 48 - 121 IU/L   AST 14 0 - 40 IU/L   ALT 24 0 - 32 IU/L  CBC     Status: None   Collection Time: 02/23/20  1:12 PM  Result Value Ref Range   WBC 7.4 3.4 - 10.8 x10E3/uL   RBC 5.07 3.77 - 5.28 x10E6/uL   Hemoglobin 14.0 11.1 - 15.9 g/dL   Hematocrit 42.6 34.0 - 46.6 %   MCV 84 79 - 97 fL   MCH 27.6 26.6 - 33.0 pg   MCHC 32.9 31 - 35 g/dL   RDW 13.4 11.7 - 15.4 %   Platelets 249 150 - 450 x10E3/uL  Lipid Panel With LDL/HDL Ratio     Status: Abnormal   Collection Time: 02/23/20  1:12 PM  Result Value Ref Range   Cholesterol, Total 260 (H) 100 - 199 mg/dL   Triglycerides 189 (H) 0 - 149 mg/dL   HDL 60 >39 mg/dL   VLDL Cholesterol Cal 34 5 - 40 mg/dL   LDL Chol Calc (NIH) 166 (H) 0 - 99 mg/dL   LDL/HDL Ratio 2.8 0.0 - 3.2 ratio    Comment:                                     LDL/HDL Ratio                                             Men  Women                                1/2 Avg.Risk  1.0    1.5  Avg.Risk  3.6    3.2                                2X Avg.Risk  6.2    5.0                                3X Avg.Risk  8.0    6.1   HCV Ab w Reflex to Quant PCR     Status: None   Collection Time: 02/23/20  1:12 PM  Result Value Ref Range   HCV Ab <0.1 0.0 - 0.9 s/co ratio  Interpretation:     Status: None   Collection Time: 02/23/20  1:12 PM  Result Value Ref Range   HCV Interp 1: Comment     Comment: Negative Not infected with HCV, unless recent infection is suspected or other evidence exists to indicate HCV infection.   T4, free     Status: None   Collection Time: 02/23/20  1:12 PM  Result Value Ref Range   Free T4 0.90 0.82 - 1.77 ng/dL  TSH     Status: None   Collection Time: 02/23/20  1:12 PM  Result Value Ref Range   TSH 1.510 0.450 - 4.500 uIU/mL  VITAMIN D 25 Hydroxy (Vit-D Deficiency, Fractures)     Status: Abnormal   Collection Time: 02/23/20  1:12 PM  Result Value Ref Range   Vit D, 25-Hydroxy 12.6 (L) 30.0 - 100.0 ng/mL    Comment: Vitamin D deficiency has been defined by the [place] and an Endocrine Society practice guideline as a level of serum 25-OH vitamin D less than 20 ng/mL (1,2). The Endocrine Society went on to further define vitamin D insufficiency as a level between 21 and 29 ng/mL (2). 1. IOM (Institute of Medicine). 2010. Dietary reference    intakes for calcium and D. [place]: The    [employer]. 2. Holick MF, Binkley Bartlett, Bischoff-Ferrari HA, et al.    Evaluation, treatment, and prevention of vitamin D    deficiency: an Endocrine Society clinical practice    guideline. JCEM. 2011 Jul; 96(7):1911-30.   POCT HgB A1C     Status: Abnormal   Collection Time: 05/17/20  9:23 AM  Result Value Ref Range   Hemoglobin A1C 7.2 (A) 4.0 - 5.6 %   HbA1c POC (<> result, manual entry)     HbA1c, POC (prediabetic range)     HbA1c, POC (controlled diabetic  range)      Assessment/Plan: 1. Encounter for general adult medical examination with abnormal findings Annual health maintenance exam today.   2. Acute non-recurrent pansinusitis Start bactrim DS twice daily for 10 days. Rest and increase fluids. Take OTC medication as needed and as indicated for acute symptoms.  - sulfamethoxazole-trimethoprim (BACTRIM DS) 800-160 MG tablet; Take 1 tablet by mouth 2 (two) times daily.  Dispense: 20 tablet; Refill: 0  3. Vasomotor rhinitis Add flonase nasal spray. Use two sprays in both nostrils daily.  - fluticasone (FLONASE) 50 MCG/ACT nasal spray; Place 2 sprays into both nostrils daily.  Dispense: 16 g; Refill: 6  4. Type 2 diabetes mellitus with hyperglycemia, without long-term current use of insulin (HCC) - POCT HgB A1C 7.4 today, up from 6.6 at her last check. Has had several treatments for her back involving corticosteroids. Reviewed diabetic diet and importance of avoiding  excess carbohydrates and sugar and incorporating exercise into her daily routine to help lower blood sugars. Refer for diabetic eye exam.  - Ambulatory referral to Ophthalmology  5. Essential (primary) hypertension Generally stable. Continue BP medication as prescribed   6. Mixed hyperlipidemia Reviewed labs showing mild/moderate elevation of lipid panel. Start crestor 26m every evening. Recheck lipid panel and CMP in 3 months.  - rosuvastatin (CRESTOR) 5 MG tablet; Take 1 tablet (5 mg total) by mouth daily.  Dispense: 90 tablet; Refill: 1  7. Overactive bladder Multiple medications tried to improve symptoms. imaging studies have been unremarkable. Refer to urology for further evaluation.  - Ambulatory referral to Urology  8. Dysuria - UA/M w/rflx Culture, Routine  9. Encounter for screening mammogram for malignant neoplasm of breast - MM DIGITAL SCREENING BILATERAL; Future  General Counseling: Nyx verbalizes understanding of the findings of todays visit and agrees  with plan of treatment. I have discussed any further diagnostic evaluation that may be needed or ordered today. We also reviewed her medications today. she has been encouraged to call the office with any questions or concerns that should arise related to todays visit.    Counseling:  Diabetes Counseling:  1. Addition of ACE inh/ ARB'S for nephroprotection. Microalbumin is updated  2. Diabetic foot care, prevention of complications. Podiatry consult 3. Exercise and lose weight.  4. Diabetic eye examination, Diabetic eye exam is updated  5. Monitor blood sugar closlely. nutrition counseling.  6. Sign and symptoms of hypoglycemia including shaking sweating,confusion and headaches.   This patient was seen by HLeretha PolFNP Collaboration with Dr FLavera Guiseas a part of collaborative care agreement  Orders Placed This Encounter  Procedures  . MM DIGITAL SCREENING BILATERAL  . UA/M w/rflx Culture, Routine  . Ambulatory referral to Urology  . Ambulatory referral to Ophthalmology  . POCT HgB A1C    Meds ordered this encounter  Medications  . rosuvastatin (CRESTOR) 5 MG tablet    Sig: Take 1 tablet (5 mg total) by mouth daily.    Dispense:  90 tablet    Refill:  1    Order Specific Question:   Supervising Provider    Answer:   KLavera Guise[[[id number]] . sulfamethoxazole-trimethoprim (BACTRIM DS) 800-160 MG tablet    Sig: Take 1 tablet by mouth 2 (two) times daily.    Dispense:  20 tablet    Refill:  0    Order Specific Question:   Supervising Provider    Answer:   KLavera Guise[[[id number]] . fluticasone (FLONASE) 50 MCG/ACT nasal spray    Sig: Place 2 sprays into both nostrils daily.    Dispense:  16 g    Refill:  6    Order Specific Question:   Supervising Provider    Answer:   KLavera Guise[[[id number]]   Total time spent: 424Minutes  Time spent includes review of chart, medications, test results, and follow up plan with the patient.     FLavera Guise MD  Internal  Medicine

## 2020-05-18 ENCOUNTER — Other Ambulatory Visit: Payer: Self-pay | Admitting: Nurse Practitioner

## 2020-05-18 DIAGNOSIS — Z1231 Encounter for screening mammogram for malignant neoplasm of breast: Secondary | ICD-10-CM

## 2020-05-18 LAB — UA/M W/RFLX CULTURE, ROUTINE
Bilirubin, UA: NEGATIVE
Glucose, UA: NEGATIVE
Ketones, UA: NEGATIVE
Leukocytes,UA: NEGATIVE
Nitrite, UA: NEGATIVE
Protein,UA: NEGATIVE
RBC, UA: NEGATIVE
Specific Gravity, UA: 1.025 (ref 1.005–1.030)
Urobilinogen, Ur: 0.2 mg/dL (ref 0.2–1.0)
pH, UA: 5 (ref 5.0–7.5)

## 2020-05-18 LAB — MICROSCOPIC EXAMINATION
Casts: NONE SEEN /lpf
Epithelial Cells (non renal): >10 /hpf — AB (ref 0–10)
RBC, Urine: NONE SEEN /hpf (ref 0–2)
WBC, UA: NONE SEEN /hpf (ref 0–5)

## 2020-05-26 ENCOUNTER — Other Ambulatory Visit: Payer: Self-pay

## 2020-05-26 ENCOUNTER — Ambulatory Visit: Payer: No Typology Code available for payment source

## 2020-05-26 DIAGNOSIS — G4733 Obstructive sleep apnea (adult) (pediatric): Secondary | ICD-10-CM

## 2020-05-26 NOTE — Progress Notes (Signed)
She hasn't used new cpap will start tonight. And will follow up with me in three months

## 2020-05-27 ENCOUNTER — Other Ambulatory Visit: Payer: Self-pay

## 2020-05-27 DIAGNOSIS — I10 Essential (primary) hypertension: Secondary | ICD-10-CM

## 2020-05-27 MED ORDER — CONTOUR NEXT TEST VI STRP
1.0000 | ORAL_STRIP | Freq: Every day | 1 refills | Status: DC
Start: 2020-05-27 — End: 2020-07-26

## 2020-05-27 MED ORDER — METOPROLOL TARTRATE 25 MG PO TABS: 50.0000 mg | ORAL_TABLET | Freq: Two times a day (BID) | ORAL | 1 refills | Status: DC

## 2020-05-31 ENCOUNTER — Other Ambulatory Visit: Payer: Self-pay | Admitting: *Deleted

## 2020-05-31 DIAGNOSIS — N3281 Overactive bladder: Secondary | ICD-10-CM

## 2020-06-01 ENCOUNTER — Other Ambulatory Visit
Admission: RE | Admit: 2020-06-01 | Discharge: 2020-06-01 | Disposition: A | Discharge status: Home / Self Care | Payer: BC Managed Care – PPO | Attending: Urology | Admitting: Urology

## 2020-06-01 ENCOUNTER — Ambulatory Visit (INDEPENDENT_AMBULATORY_CARE_PROVIDER_SITE_OTHER): Payer: BC Managed Care – PPO | Admitting: Urology

## 2020-06-01 ENCOUNTER — Encounter: Payer: Self-pay | Admitting: Urology

## 2020-06-01 ENCOUNTER — Other Ambulatory Visit: Payer: Self-pay

## 2020-06-01 VITALS — BP 143/84 | HR 62 | Ht 69.0 in | Wt 339.0 lb

## 2020-06-01 DIAGNOSIS — N3281 Overactive bladder: Secondary | ICD-10-CM | POA: Insufficient documentation

## 2020-06-01 LAB — URINALYSIS, COMPLETE (UACMP) WITH MICROSCOPIC
Glucose, UA: NEGATIVE mg/dL
Hgb urine dipstick: NEGATIVE
Leukocytes,Ua: NEGATIVE
Nitrite: NEGATIVE
Protein, ur: NEGATIVE mg/dL
RBC / HPF: NONE SEEN RBC/hpf (ref 0–5)
Specific Gravity, Urine: >1.03 — ABNORMAL HIGH (ref 1.005–1.030)
Squamous Epithelial / HPF: >50 (ref 0–5)
pH: 5.5 (ref 5.0–8.0)

## 2020-06-01 LAB — BLADDER SCAN AMB NON-IMAGING

## 2020-06-01 NOTE — Progress Notes (Signed)
06/01/20 10:56 AM   Vickii Chafe Darla Lesches 1960/12/18 [id number]  CC: Overactive bladder  HPI: I saw Ms. Caughlin in urology clinic today for evaluation of overactive bladder. She is a comorbid 59 year old female with past medical history notable for morbid obesity and BMI of 50, anxiety/depression, diabetes, and sleep apnea on CPAP. She reports at least 1 year of severe urinary symptoms of urgency, frequency, and urge incontinence both day and night. She is compliant with CPAP for sleep apnea. Notably, she drinks at least 50 ounces of diet Pepsi during the day. PVR is normal today at 50 mL. She denies any history of gross hematuria, dysuria, or recurrent UTIs. She was previously tried on oxybutynin 10 mg XL in October, and reports no improvement on that medication. She is very frustrated with her urinary symptoms. She also has chronic low back pain on narcotics.  Urinalysis today is benign with negative glucose, few bacteria, 0 RBCs, greater than 50 squamous cells, 0-5 WBCs, no leukocytes, nitrite negative.  PMH: Past Medical History:  Diagnosis Date  . Anginal pain (HCC)    tightness related to anxiety  . Anxiety   . Depression   . Diabetes mellitus without complication ([place])   . GERD (gastroesophageal reflux disease)    throws up easily but not diagnosed with reflux  . History of C-section 58  . Hypertension   . Sleep apnea    uses cpap    Surgical History: Past Surgical History:  Procedure Laterality Date  . CESAREAN SECTION  1992  . COLONOSCOPY     polyps removed first procedure. 2nd time all was clear  . COLONOSCOPY WITH PROPOFOL N/A 03/14/2019   Procedure: COLONOSCOPY WITH PROPOFOL;  Surgeon: Lin Landsman, MD;  Location: [facility] ENDOSCOPY;  Service: Gastroenterology;  Laterality: N/A;  . FINGER SURGERY Left 2011   left finger cut off x 2.(only up to last digit)  . HEMATOMA EVACUATION Left 1999   upper part of foot was injured d/t 500lb weight landing on her foot.   Marland Kitchen  LAPAROSCOPIC SALPINGO OOPHERECTOMY Left 04/02/2018   Procedure: LAPAROSCOPIC SALPINGO OOPHORECTOMY;  Surgeon: Malachy Mood, MD;  Location: [facility] ORS;  Service: Gynecology;  Laterality: Left;    Family History: Family History  Problem Relation Age of Onset  . Breast cancer Mother 40       x 3 times  . Colon cancer Mother   . Brain cancer Mother   . Cancer - Colon Mother   . Cancer - Other Mother   . Heart Problems Father   . Colon cancer Brother   . Heart Problems Brother     Social History:  reports that she has never smoked. She has never used smokeless tobacco. She reports that she does not drink alcohol and does not use drugs.  Physical Exam: BP (!) 143/84   Pulse 62   Ht 5\' 9"  (1.753 m)   Wt (!) 339 lb (153.8 kg)   BMI 50.06 kg/m    Constitutional:  Alert and oriented, No acute distress. Morbidly obese Cardiovascular: No clubbing, cyanosis, or edema. Respiratory: Normal respiratory effort, no increased work of breathing.  Laboratory Data: Reviewed, see HPI  Pertinent Imaging: None to review  Assessment & Plan:   59 year old female with diabetes, chronic pain on narcotics, and morbid obesity and BMI of 50 with OAB.  We discussed that overactive bladder (OAB) is not a disease, but is a symptom complex that is generally not life-threatening.  Symptoms typically include urinary urgency, frequency,  and urge incontinence.  There are numerous treatment options, however there are risks and benefits with both medical and surgical management.  First-line treatment is behavioral therapies including bladder training, pelvic floor muscle training, and fluid management.  Second line treatments include oral antimuscarinics(Ditropan er, Trospium) and beta-3 agonist (Mybetriq). There is typically a period of medication trial (4-8 weeks) to find the optimal therapy and dosing. If symptoms are bothersome despite the above management, third line options include intra-detrusor botox,  peripheral tibial nerve stimulation (PTNS), and interstim (SNS). These are more invasive treatments with higher side effect profile, but may improve quality of life for patients with severe OAB symptoms.   I had a very frank conversation with the patient that her morbid obesity puts her at high risk for OAB and urinary symptoms, as well as her high volume consumption of diet sodas. Behavioral strategies were discussed at length. I also recommended a trial of Myrbetriq 50 mg daily, and samples were given. She may be a candidate for PTNS or even bladder Botox in the future if she has no improvement with the above strategies.  RTC 4 weeks with PA for symptom check  Nickolas Madrid, MD 06/01/2020  [facility] [address], [place], [place] [address] [phone]

## 2020-06-01 NOTE — Patient Instructions (Signed)

## 2020-06-21 ENCOUNTER — Ambulatory Visit: Payer: BC Managed Care – PPO | Admitting: Internal Medicine

## 2020-06-28 ENCOUNTER — Other Ambulatory Visit: Payer: Self-pay

## 2020-06-28 ENCOUNTER — Ambulatory Visit: Payer: BC Managed Care – PPO | Admitting: Urology

## 2020-06-28 MED ORDER — CETIRIZINE HCL 10 MG PO TABS
ORAL_TABLET | ORAL | 4 refills | Status: DC
Start: 2020-06-28 — End: 2021-02-25

## 2020-06-28 NOTE — Progress Notes (Deleted)
06/28/20 6:07 AM   Candace Clayton June 27, 1960 [id number]  CC: Overactive bladder  HPI: I saw Candace Clayton in urology clinic today for evaluation of overactive bladder. She is a comorbid 60 year old female with past medical history notable for morbid obesity and BMI of 50, anxiety/depression, diabetes, and sleep apnea on CPAP. She reports at least 1 year of severe urinary symptoms of urgency, frequency, and urge incontinence both day and night. She is compliant with CPAP for sleep apnea. Notably, she drinks at least 50 ounces of diet Pepsi during the day. PVR is normal today at 50 mL. She denies any history of gross hematuria, dysuria, or recurrent UTIs. She was previously tried on oxybutynin 10 mg XL in October, and reports no improvement on that medication. She is very frustrated with her urinary symptoms. She also has chronic low back pain on narcotics.  Urinalysis today is benign with negative glucose, few bacteria, 0 RBCs, greater than 50 squamous cells, 0-5 WBCs, no leukocytes, nitrite negative.  PMH: Past Medical History:  Diagnosis Date  . Anginal pain (HCC)    tightness related to anxiety  . Anxiety   . Depression   . Diabetes mellitus without complication ([place])   . GERD (gastroesophageal reflux disease)    throws up easily but not diagnosed with reflux  . History of C-section 59  . Hypertension   . Sleep apnea    uses cpap    Surgical History: Past Surgical History:  Procedure Laterality Date  . CESAREAN SECTION  1992  . COLONOSCOPY     polyps removed first procedure. 2nd time all was clear  . COLONOSCOPY WITH PROPOFOL N/A 03/14/2019   Procedure: COLONOSCOPY WITH PROPOFOL;  Surgeon: Lin Landsman, MD;  Location: [facility] ENDOSCOPY;  Service: Gastroenterology;  Laterality: N/A;  . FINGER SURGERY Left 2011   left finger cut off x 2.(only up to last digit)  . HEMATOMA EVACUATION Left 1999   upper part of foot was injured d/t 500lb weight landing on her foot.   Marland Kitchen  LAPAROSCOPIC SALPINGO OOPHERECTOMY Left 04/02/2018   Procedure: LAPAROSCOPIC SALPINGO OOPHORECTOMY;  Surgeon: Malachy Mood, MD;  Location: [facility] ORS;  Service: Gynecology;  Laterality: Left;    Family History: Family History  Problem Relation Age of Onset  . Breast cancer Mother 40       x 3 times  . Colon cancer Mother   . Brain cancer Mother   . Cancer - Colon Mother   . Cancer - Other Mother   . Heart Problems Father   . Colon cancer Brother   . Heart Problems Brother     Social History:  reports that she has never smoked. She has never used smokeless tobacco. She reports that she does not drink alcohol and does not use drugs.  Physical Exam: There were no vitals taken for this visit.   Constitutional:  Alert and oriented, No acute distress. Morbidly obese Cardiovascular: No clubbing, cyanosis, or edema. Respiratory: Normal respiratory effort, no increased work of breathing.  Laboratory Data: Reviewed, see HPI  Pertinent Imaging: None to review  Assessment & Plan:   61 year old female with diabetes, chronic pain on narcotics, and morbid obesity and BMI of 50 with OAB.  We discussed that overactive bladder (OAB) is not a disease, but is a symptom complex that is generally not life-threatening.  Symptoms typically include urinary urgency, frequency, and urge incontinence.  There are numerous treatment options, however there are risks and benefits with both medical and surgical  management.  First-line treatment is behavioral therapies including bladder training, pelvic floor muscle training, and fluid management.  Second line treatments include oral antimuscarinics(Ditropan er, Trospium) and beta-3 agonist (Mybetriq). There is typically a period of medication trial (4-8 weeks) to find the optimal therapy and dosing. If symptoms are bothersome despite the above management, third line options include intra-detrusor botox, peripheral tibial nerve stimulation (PTNS), and interstim  (SNS). These are more invasive treatments with higher side effect profile, but may improve quality of life for patients with severe OAB symptoms.   I had a very frank conversation with the patient that her morbid obesity puts her at high risk for OAB and urinary symptoms, as well as her high volume consumption of diet sodas. Behavioral strategies were discussed at length. I also recommended a trial of Myrbetriq 50 mg daily, and samples were given. She may be a candidate for PTNS or even bladder Botox in the future if she has no improvement with the above strategies.  RTC 4 weeks with PA for symptom check  Legrand Rams, MD 06/28/2020  [facility] [address] [place], [place] [address] [phone]

## 2020-06-29 ENCOUNTER — Encounter: Payer: BC Managed Care – PPO | Admitting: Student in an Organized Health Care Education/Training Program

## 2020-06-30 ENCOUNTER — Other Ambulatory Visit: Payer: Self-pay

## 2020-06-30 DIAGNOSIS — E1165 Type 2 diabetes mellitus with hyperglycemia: Secondary | ICD-10-CM

## 2020-06-30 MED ORDER — METFORMIN HCL 500 MG PO TABS: 500.0000 mg | ORAL_TABLET | Freq: Two times a day (BID) | ORAL | 1 refills | Status: DC

## 2020-07-05 ENCOUNTER — Ambulatory Visit: Payer: BC Managed Care – PPO | Admitting: Urology

## 2020-07-05 ENCOUNTER — Other Ambulatory Visit: Payer: Self-pay

## 2020-07-05 DIAGNOSIS — F411 Generalized anxiety disorder: Secondary | ICD-10-CM

## 2020-07-05 MED ORDER — BUSPIRONE HCL 15 MG PO TABS: 15.0000 mg | ORAL_TABLET | Freq: Three times a day (TID) | ORAL | 3 refills | Status: DC

## 2020-07-06 ENCOUNTER — Ambulatory Visit
Payer: No Typology Code available for payment source | Attending: Student in an Organized Health Care Education/Training Program | Admitting: Student in an Organized Health Care Education/Training Program

## 2020-07-07 ENCOUNTER — Encounter: Payer: Self-pay | Admitting: Student in an Organized Health Care Education/Training Program

## 2020-07-07 ENCOUNTER — Ambulatory Visit
Payer: No Typology Code available for payment source | Attending: Student in an Organized Health Care Education/Training Program | Admitting: Student in an Organized Health Care Education/Training Program

## 2020-07-07 ENCOUNTER — Other Ambulatory Visit: Payer: Self-pay

## 2020-07-07 VITALS — BP 175/81 | HR 62 | Temp 98.1°F | Ht 69.0 in | Wt 330.0 lb

## 2020-07-07 DIAGNOSIS — M47816 Spondylosis without myelopathy or radiculopathy, lumbar region: Secondary | ICD-10-CM | POA: Diagnosis not present

## 2020-07-07 DIAGNOSIS — G894 Chronic pain syndrome: Secondary | ICD-10-CM

## 2020-07-07 DIAGNOSIS — M5136 Other intervertebral disc degeneration, lumbar region: Secondary | ICD-10-CM | POA: Diagnosis not present

## 2020-07-07 DIAGNOSIS — M51369 Other intervertebral disc degeneration, lumbar region without mention of lumbar back pain or lower extremity pain: Secondary | ICD-10-CM

## 2020-07-07 MED ORDER — PREGABALIN 50 MG PO CAPS: 100.0000 mg | ORAL_CAPSULE | Freq: Two times a day (BID) | ORAL | 5 refills | Status: DC

## 2020-07-07 MED ORDER — OXYCODONE-ACETAMINOPHEN 5-325 MG PO TABS: 1.0000 | ORAL_TABLET | Freq: Three times a day (TID) | ORAL | 0 refills | Status: DC | PRN

## 2020-07-07 MED ORDER — OXYCODONE-ACETAMINOPHEN 5-325 MG PO TABS
1.0000 | ORAL_TABLET | Freq: Three times a day (TID) | ORAL | 0 refills | Status: DC | PRN
Start: 2020-08-06 — End: 2020-09-30

## 2020-07-07 NOTE — Progress Notes (Signed)
Nursing Pain Medication Assessment:  Safety precautions to be maintained throughout the outpatient stay will include: orient to surroundings, keep bed in low position, maintain call bell within reach at all times, provide assistance with transfer out of bed and ambulation.  Medication Inspection Compliance: Ms. Kramme did not comply with our request to bring her pills to be counted. She was reminded that bringing the medication bottles, even when empty, is a requirement.  Medication: None brought in. Pill/Patch Count: None available to be counted. Bottle Appearance: No container available. Did not bring bottle(s) to appointment. Filled Date: N/A Last Medication intake:  Ran out of medicine more than 48 hours agoSafety precautions to be maintained throughout the outpatient stay will include: orient to surroundings, keep bed in low position, maintain call bell within reach at all times, provide assistance with transfer out of bed and ambulation.

## 2020-07-07 NOTE — Patient Instructions (Signed)
____________________________________________________________________________________________  Preparing for your procedure (without sedation)  Procedure appointments are limited to planned procedures: . No Prescription Refills. . No disability issues will be discussed. . No medication changes will be discussed.  Instructions: . Oral Intake: Do not eat or drink anything for at least 6 hours prior to your procedure. (Exception: Blood Pressure Medication. See below.) . Transportation: Unless otherwise stated by your physician, you may drive yourself after the procedure. . Blood Pressure Medicine: Do not forget to take your blood pressure medicine with a sip of water the morning of the procedure. If your Diastolic (lower reading)is above 100 mmHg, elective cases will be cancelled/rescheduled. . Blood thinners: These will need to be stopped for procedures. Notify our staff if you are taking any blood thinners. Depending on which one you take, there will be specific instructions on how and when to stop it. . Diabetics on insulin: Notify the staff so that you can be scheduled 1st case in the morning. If your diabetes requires high dose insulin, take only  of your normal insulin dose the morning of the procedure and notify the staff that you have done so. . Preventing infections: Shower with an antibacterial soap the morning of your procedure.  . Build-up your immune system: Take 1000 mg of Vitamin C with every meal (3 times a day) the day prior to your procedure. . Antibiotics: Inform the staff if you have a condition or reason that requires you to take antibiotics before dental procedures. . Pregnancy: If you are pregnant, call and cancel the procedure. . Sickness: If you have a cold, fever, or any active infections, call and cancel the procedure. . Arrival: You must be in the facility at least 30 minutes prior to your scheduled procedure. . Children: Do not bring any children with you. . Dress  appropriately: Bring dark clothing that you would not mind if they get stained. . Valuables: Do not bring any jewelry or valuables.  Reasons to call and reschedule or cancel your procedure: (Following these recommendations will minimize the risk of a serious complication.) . Surgeries: Avoid having procedures within 2 weeks of any surgery. (Avoid for 2 weeks before or after any surgery). . Flu Shots: Avoid having procedures within 2 weeks of a flu shots or . (Avoid for 2 weeks before or after immunizations). . Barium: Avoid having a procedure within 7-10 days after having had a radiological study involving the use of radiological contrast. (Myelograms, Barium swallow or enema study). . Heart attacks: Avoid any elective procedures or surgeries for the initial 6 months after a "Myocardial Infarction" (Heart Attack). . Blood thinners: It is imperative that you stop these medications before procedures. Let us know if you if you take any blood thinner.  . Infection: Avoid procedures during or within two weeks of an infection (including chest colds or gastrointestinal problems). Symptoms associated with infections include: Localized redness, fever, chills, night sweats or profuse sweating, burning sensation when voiding, cough, congestion, stuffiness, runny nose, sore throat, diarrhea, nausea, vomiting, cold or Flu symptoms, recent or current infections. It is specially important if the infection is over the area that we intend to treat. . Heart and lung problems: Symptoms that may suggest an active cardiopulmonary problem include: cough, chest pain, breathing difficulties or shortness of breath, dizziness, ankle swelling, uncontrolled high or unusually low blood pressure, and/or palpitations. If you are experiencing any of these symptoms, cancel your procedure and contact your primary care physician for an evaluation.  Remember:  Regular   Business hours are:  Monday to Thursday 8:00 AM to 4:00  PM  Provider's Schedule: Milinda Pointer, MD:  Procedure days: Tuesday and Thursday 7:30 AM to 4:00 PM  Gillis Santa, MD:  Procedure days: Monday and Wednesday 7:30 AM to 4:00 PM ____________________________________________________________________________________________   Facet Joint Block The facet joints connect the bones of the spine (vertebrae). They make it possible for you to bend, twist, and make other movements with your spine. They also keep you from bending too far, twisting too far, and making other extreme movements. A facet joint block is a procedure in which a numbing medicine (anesthetic) is injected into a facet joint. In many cases, an anti-inflammatory medicine (steroid) is also injected. A facet joint block may be done:  To diagnose neck or back pain. If the pain gets better after a facet joint block, it means the pain is probably coming from the facet joint. If the pain does not get better, it means the pain is probably not coming from the facet joint.  To relieve neck or back pain that is caused by an inflamed facet joint. A facet joint block is only done to relieve pain if the pain does not improve with other methods, such as medicine, exercise programs, and physical therapy. Tell a health care provider about:  Any allergies you have.  All medicines you are taking, including vitamins, herbs, eye drops, creams, and over-the-counter medicines.  Any problems you or family members have had with anesthetic medicines.  Any blood disorders you have.  Any surgeries you have had.  Any medical conditions you have or have had.  Whether you are pregnant or may be pregnant. What are the risks? Generally, this is a safe procedure. However, problems may occur, including:  Bleeding.  Injury to a nerve near the injection site.  Pain at the injection site.  Weakness or numbness in areas controlled by nerves near the injection site.  Infection.  Temporary fluid  retention.  Allergic reactions to medicines or dyes.  Injury to other structures or organs near the injection site. What happens before the procedure? Medicines Ask your health care provider about:  Changing or stopping your regular medicines. This is especially important if you are taking diabetes medicines or blood thinners.  Taking medicines such as aspirin and ibuprofen. These medicines can thin your blood. Do not take these medicines unless your health care provider tells you to take them.  Taking over-the-counter medicines, vitamins, herbs, and supplements. Eating and drinking Follow instructions from your health care provider about eating and drinking, which may include:  8 hours before the procedure - stop eating heavy meals or foods, such as meat, fried foods, or fatty foods.  6 hours before the procedure - stop eating light meals or foods, such as toast or cereal.  6 hours before the procedure - stop drinking milk or drinks that contain milk.  2 hours before the procedure - stop drinking clear liquids. Staying hydrated Follow instructions from your health care provider about hydration, which may include:  Up to 2 hours before the procedure - you may continue to drink clear liquids, such as water, clear fruit juice, black coffee, and plain tea. General instructions  Do not use any products that contain nicotine or tobacco for at least 4-6 weeks before the procedure. These products include cigarettes, e-cigarettes, and chewing tobacco. If you need help quitting, ask your health care provider.  Plan to have someone take you home from the hospital or clinic.  Ask your health care provider: ? How your surgery site will be marked. ? What steps will be taken to help prevent infection. These may include:  Removing hair at the surgery site.  Washing skin with a germ-killing soap.  Receiving antibiotic medicine. What happens during the procedure?  You will put on a  hospital gown.  You will lie on your stomach on an X-ray table. You may be asked to lie in a different position if an injection will be made in your neck.  Machines will be used to monitor your oxygen levels, heart rate, and blood pressure.  Your skin will be cleaned.  If an injection will be made in your neck, an IV will be inserted into one of your veins. Fluids and medicine will flow directly into your body through the IV.  A numbing medicine (local anesthetic) will be applied to your skin. Your skin may sting or burn for a moment.  A video X-ray machine (fluoroscopy) will be used to find the joint. In some cases, a CT scan may be used.  A contrast dye may be injected into the facet joint area to help find the joint.  When the joint is located, an anesthetic will be injected into the joint through the needle.  Your health care provider will ask you whether you feel pain relief. ? If you feel relief, a steroid may be injected to provide pain relief for a longer period of time. ? If you do not feel relief or feel only partial relief, additional injections of an anesthetic may be made in other facet joints.  The needle will be removed.  Your skin will be cleaned.  A bandage (dressing) will be applied over each injection site. The procedure may vary among health care providers and hospitals.   What happens after the procedure?  Your blood pressure, heart rate, breathing rate, and blood oxygen level will be monitored until you leave the hospital or clinic.  You will lie down and rest for a period of time. Summary  A facet joint block is a procedure in which a numbing medicine (anesthetic) is injected into a facet joint. An anti-inflammatory medicine (stereoid) may also be injected.  Follow instructions from your health care provider about medicines and eating and drinking before the procedure.  Do not use any products that contain nicotine or tobacco for at least 4-6 weeks before  the procedure.  You will lie on your stomach for the procedure, but you may be asked to lie in a different position if an injection will be made in your neck.  When the joint is located, an anesthetic will be injected into the joint through the needle. This information is not intended to replace advice given to you by your health care provider. Make sure you discuss any questions you have with your health care provider. Document Revised: 10/03/2018 Document Reviewed: 05/17/2018 Elsevier Patient Education  2021 [employer].

## 2020-07-07 NOTE — Progress Notes (Signed)
PROVIDER NOTE: Information contained herein reflects review and annotations entered in association with encounter. Interpretation of such information and data should be left to medically-trained personnel. Information provided to patient can be located elsewhere in the medical record under "Patient Instructions". Document created using STT-dictation technology, any transcriptional errors that may result from process are unintentional.    Patient: Candace Clayton  Service Category: E/M  Provider: Gillis Santa, MD  DOB: Feb 23, 1961  DOS: 07/07/2020  Specialty: Interventional Pain Management  MRN: [id number]  Setting: Ambulatory outpatient  PCP: Ronnell Freshwater, NP  Type: Established Patient    Referring Provider: Ronnell Freshwater, NP  Location: Office  Delivery: Face-to-face     HPI  [name], a 60 y.o. year old female, is here today because of her Lumbar facet arthropathy [M47.816]. [name] primary complain today is Back Pain Last encounter: My last encounter with her was on 07/06/2020. Pertinent problems: [name] has Major depressive disorder, recurrent episode, moderate ([place]); Morbid obesity ([place]); Low back pain with sciatica; OSA on CPAP; Lumbar degenerative disc disease; Lumbar facet arthropathy (R>L); Lumbar spondylosis; Chronic pain syndrome; and Type 2 diabetes mellitus with hyperglycemia, without long-term current use of insulin (HCC) on their pertinent problem list. Pain Assessment: Severity of Chronic pain is reported as a 8 /10. Location: Back Right,Left,Lower/pain radiaties down right leg. Onset: More than a month ago. Quality: Aching,Burning,Sharp,Throbbing. Timing: Constant. Modifying factor(s): meds, heating pad. Vitals:  height is 5' 9"  (1.753 m) and weight is 330 lb (149.7 kg) (abnormal). Her temperature is 98.1 F (36.7 C). Her blood pressure is 175/81 (abnormal) and her pulse is 62. Her oxygen saturation is 99%.   Reason for encounter: medication management.     Increased low back, requesting repeating lumbar facet medial branch nerve blocks. 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  Pharmacotherapy Assessment   Analgesic: Percocet 5 mg 3 times daily as needed, quantity 90/month; MME equals 22.5    Monitoring: [place] PMP: PDMP reviewed during this encounter.       Pharmacotherapy: No side-effects or adverse reactions reported. Compliance: No problems identified. Effectiveness: Clinically acceptable.  Chauncey Fischer, RN  07/07/2020 12:15 PM  Sign when Signing Visit Nursing Pain Medication Assessment:  Safety precautions to be maintained throughout the outpatient stay will include: orient to surroundings, keep bed in low position, maintain call bell within reach at all times, provide assistance with transfer out of bed and ambulation.  Medication Inspection Compliance: [name] did not comply with our request to bring her pills to be counted. She was reminded that bringing the medication bottles, even when empty, is a requirement.  Medication: None brought in. Pill/Patch Count: None available to be counted. Bottle Appearance: No container available. Did not bring bottle(s) to appointment. Filled Date: N/A Last Medication intake:  Ran out of medicine more than 48 hours agoSafety precautions to be maintained throughout the outpatient stay will include: orient to surroundings, keep bed in low position, maintain call bell within reach at all times, provide assistance with transfer out of bed and ambulation.     UDS:  Summary  Date Value Ref Range Status  08/26/2019 Note  Final    Comment:    ==================================================================== [employer]  (  MW) ==================================================================== Test                             Result       Flag       Units Drug Present and Declared for Prescription Verification   Oxycodone                      139          EXPECTED   ng/mg creat   Oxymorphone                    688          EXPECTED   ng/mg creat   Noroxycodone                   473          EXPECTED   ng/mg creat   Noroxymorphone                 163          EXPECTED   ng/mg creat    Sources of oxycodone are scheduled prescription medications.    Oxymorphone, noroxycodone, and noroxymorphone are expected    metabolites of oxycodone. Oxymorphone is also available as a    scheduled prescription medication. ==================================================================== Test                      Result    Flag   Units      Ref Range   Creatinine              64               mg/dL      >=20 ==================================================================== Declared Medications:  The flagging and interpretation on this report are based on the  following declared medications.  Unexpected results may arise from  inaccuracies in the declared medications.  **Note: The testing scope of this panel includes these medications:  Oxycodone  **Note: The testing scope of this panel does not include the  following reported medications:  Acetaminophen  Albuterol  Budesonide (Symbicort)  Buspirone  Cetirizine  Desvenlafaxine (Pristiq)  Docusate (Stool Softener)  Enalapril  Formoterol (Symbicort)  Hydrochlorothiazide  Hydroxyzine  Multivitamin  Pregabalin  Sulfamethoxazole (Bactrim)  Trimethoprim (Bactrim) ==================================================================== For clinical consultation, please call [phone]. ====================================================================      ROS  Constitutional: Denies any fever or chills Gastrointestinal: No reported hemesis, hematochezia,  vomiting, or acute GI distress Musculoskeletal: +lbp Neurological: No reported episodes of acute onset apraxia, aphasia, dysarthria, agnosia, amnesia, paralysis, loss of coordination, or loss of consciousness  Medication Review  Microlet Lancets, Multiple Vitamins-Minerals, NON FORMULARY, OVER THE COUNTER MEDICATION, albuterol, busPIRone, cetirizine, desvenlafaxine, enalapril, ergocalciferol, fluticasone, glucose blood, hydrOXYzine, hydrochlorothiazide, metFORMIN, metoprolol tartrate, oxyCODONE-acetaminophen, pregabalin, rosuvastatin, sulfamethoxazole-trimethoprim, and umeclidinium-vilanterol  History Review  Allergy: [name] is allergic to gabapentin. Drug: [name]  reports no history of drug use. Alcohol:  reports no history of alcohol use. Tobacco:  reports that she has never smoked. She has never used smokeless tobacco. Social: [name]  reports that she has never smoked. She has never used smokeless tobacco. She reports that she does not drink alcohol and does not use drugs. Medical:  has a past medical history of Anginal pain ([place]), Anxiety, Depression, Diabetes mellitus without complication ([place]), GERD (gastroesophageal reflux disease), History of C-section (1992), Hypertension, and Sleep apnea.  Surgical: [name]  has a past surgical history that includes Cesarean section (1992); Finger surgery (Left, 2011); Hematoma evacuation (Left, 1999); Colonoscopy; Laparoscopic salpingo oophorectomy (Left, 04/02/2018); and Colonoscopy with propofol (N/A, 03/14/2019). Family: family history includes Brain cancer in her mother; Breast cancer (age of onset: 83) in her mother; Cancer - Colon in her mother; Cancer - Other in her mother; Colon cancer in her brother and mother; Heart Problems in her brother and father.  Laboratory Chemistry Profile   Renal Lab Results  Component Value Date   BUN 16 02/23/2020   CREATININE 0.72 02/23/2020   BCR 22 02/23/2020   GFRAA 106 02/23/2020    GFRNONAA 92 02/23/2020     Hepatic Lab Results  Component Value Date   AST 14 02/23/2020   ALT 24 02/23/2020   ALBUMIN 4.2 02/23/2020   ALKPHOS 103 02/23/2020     Electrolytes Lab Results  Component Value Date   NA 141 02/23/2020   K 5.0 02/23/2020   CL 103 02/23/2020   CALCIUM 9.9 02/23/2020     Bone Lab Results  Component Value Date   VD25OH 12.6 (L) 02/23/2020     Inflammation (CRP: Acute Phase) (ESR: Chronic Phase) No results found for: CRP, ESRSEDRATE, LATICACIDVEN     Note: Above Lab results reviewed.   Physical Exam  General appearance: Well nourished, well developed, and well hydrated. In no apparent acute distress Mental status: Alert, oriented x 3 (person, place, & time)       Respiratory: No evidence of acute respiratory distress Eyes: PERLA Vitals: BP (!) 175/81   Pulse 62   Temp 98.1 F (36.7 C)   Ht 5' 9"  (1.753 m)   Wt (!) 330 lb (149.7 kg)   SpO2 99%   BMI 48.73 kg/m  BMI: Estimated body mass index is 48.73 kg/m as calculated from the following:   Height as of this encounter: 5' 9"  (1.753 m).   Weight as of this encounter: 330 lb (149.7 kg). Ideal: Ideal body weight: 66.2 kg (145 lb 15.1 oz) Adjusted ideal body weight: 99.6 kg (219 lb 9.1 oz)   Lumbar Spine Area Exam  Skin & Axial Inspection: No masses, redness, or swelling Alignment: Symmetrical Functional ROM: Restricted ROM       Stability: No instability detected Muscle Tone/Strength: Functionally intact. No obvious neuro-muscular anomalies detected. Sensory (Neurological): Musculoskeletal pain pattern  Provocative Tests: Hyperextension/rotation test: Positive bilaterally for facet joint pain.  Gait & Posture Assessment  Ambulation: Unassisted Gait: Relatively normal for age and body habitus Posture: WNL   Lower Extremity Exam    Side: Right lower extremity  Side: Left lower extremity  Stability: No instability observed          Stability: No instability observed           Skin & Extremity Inspection: Skin color, temperature, and hair growth are WNL. No peripheral edema or cyanosis. No masses, redness, swelling, asymmetry, or associated skin lesions. No contractures.  Skin & Extremity Inspection: Skin color, temperature, and hair growth are WNL. No peripheral edema or cyanosis. No masses, redness, swelling, asymmetry, or associated skin lesions. No contractures.  Functional ROM: Unrestricted ROM                  Functional ROM: Unrestricted ROM                  Muscle Tone/Strength: Functionally intact. No obvious neuro-muscular anomalies detected.  Muscle Tone/Strength: Functionally intact. No obvious neuro-muscular anomalies  detected.  Sensory (Neurological): Unimpaired        Sensory (Neurological): Unimpaired        DTR: Patellar: deferred today Achilles: deferred today Plantar: deferred today  DTR: Patellar: deferred today Achilles: deferred today Plantar: deferred today  Palpation: No palpable anomalies  Palpation: No palpable anomalies     Assessment   Status Diagnosis  Having a Flare-up Having a Flare-up Persistent 1. Lumbar facet arthropathy (R>L)   2. Lumbar spondylosis   3. Lumbar degenerative disc disease   4. Chronic pain syndrome       Plan of Care  Candace Clayton has a current medication list which includes the following long-term medication(s): albuterol, cetirizine, desvenlafaxine, enalapril, fluticasone, hydrochlorothiazide, metformin, metoprolol tartrate, oxycodone-acetaminophen, [START ON 08/06/2020] oxycodone-acetaminophen, [START ON 09/05/2020] oxycodone-acetaminophen, pregabalin, and rosuvastatin.  1.  Refill oxycodone as below.  No change in dose.  UDS up-to-date and appropriate.  PMP checked and reviewed. 2.  Increase Lyrica to 100 mg twice daily 3.  Schedule for bilateral L3, L4, L5 facet medial branch nerve blocks for pain relief.  Can consider repeating lumbar radiofrequency ablation after  April.  Pharmacotherapy (Medications Ordered): Meds ordered this encounter  Medications  . oxyCODONE-acetaminophen (PERCOCET/ROXICET) 5-325 MG tablet    Sig: Take 1 tablet by mouth every 8 (eight) hours as needed for severe pain.    Dispense:  90 tablet    Refill:  0    For chronic pain syndrome. To last for 30 days from fill date.  Marland Kitchen oxyCODONE-acetaminophen (PERCOCET/ROXICET) 5-325 MG tablet    Sig: Take 1 tablet by mouth every 8 (eight) hours as needed for severe pain.    Dispense:  90 tablet    Refill:  0    For chronic pain syndrome. To last for 30 days from fill date.  Marland Kitchen oxyCODONE-acetaminophen (PERCOCET/ROXICET) 5-325 MG tablet    Sig: Take 1 tablet by mouth every 8 (eight) hours as needed for severe pain.    Dispense:  90 tablet    Refill:  0    For chronic pain syndrome. To last for 30 days from fill date.  . pregabalin (LYRICA) 50 MG capsule    Sig: Take 2 capsules (100 mg total) by mouth 2 (two) times daily.    Dispense:  120 capsule    Refill:  5   Orders:  Orders Placed This Encounter  Procedures  . LUMBAR FACET(MEDIAL BRANCH NERVE BLOCK) MBNB    Standing Status:   Future    Standing Expiration Date:   08/07/2020    Scheduling Instructions:     Procedure: Lumbar facet block (AKA.: Lumbosacral medial branch nerve block)     Side: Bilateral     Level: L3-4, L4-5,  Facets ( L3, L4, L5,  Medial Branch Nerves)     Sedation: without.     Timeframe: ASAA    Order Specific Question:   Where will this procedure be performed?    Answer:   [facility]   Follow-up plan:   Return in about 2 weeks (around 07/21/2020) for B/L L3, 4, 5 Fct Block , without sedation.     Status post diagnostic bilateral L3, L4, L5, S1 facet medial branch nerve blocks, status post right L3, L4, L5, S1 RFA on 05/05/2019, 04/15/20; left L3, L4, L5 RFA 05/03/2020.     Recent Visits Date Type Provider Dept  07/06/20 Appointment Gillis Santa, MD [facility]  05/03/20 Procedure visit  Gillis Santa, MD [facility]  04/14/20 Procedure visit Gillis Santa, MD [facility]  Showing recent visits within past 90 days and meeting all other requirements Today's Visits Date Type Provider Dept  07/07/20 Office Visit Gillis Santa, MD [facility]  Showing today's visits and meeting all other requirements Future Appointments Date Type Provider Dept  07/21/20 Appointment Gillis Santa, MD [facility]  Showing future appointments within next 90 days and meeting all other requirements  I discussed the assessment and treatment plan with the patient. The patient was provided an opportunity to ask questions and all were answered. The patient agreed with the plan and demonstrated an understanding of the instructions.  Patient advised to call back or seek an in-person evaluation if the symptoms or condition worsens.  Duration of encounter: 30 minutes.  Note by: Gillis Santa, MD Date: 07/07/2020; Time: 1:26 PM

## 2020-07-20 ENCOUNTER — Ambulatory Visit: Payer: BC Managed Care – PPO | Admitting: Internal Medicine

## 2020-07-21 ENCOUNTER — Ambulatory Visit (HOSPITAL_BASED_OUTPATIENT_CLINIC_OR_DEPARTMENT_OTHER)
Payer: No Typology Code available for payment source | Admitting: Student in an Organized Health Care Education/Training Program

## 2020-07-21 ENCOUNTER — Ambulatory Visit
Admission: RE | Admit: 2020-07-21 | Discharge: 2020-07-21 | Disposition: A | Discharge status: Home / Self Care | Payer: No Typology Code available for payment source | Source: Ambulatory Visit | Attending: Student in an Organized Health Care Education/Training Program | Admitting: Student in an Organized Health Care Education/Training Program

## 2020-07-21 ENCOUNTER — Other Ambulatory Visit: Payer: Self-pay

## 2020-07-21 ENCOUNTER — Encounter: Payer: Self-pay | Admitting: Student in an Organized Health Care Education/Training Program

## 2020-07-21 DIAGNOSIS — M47816 Spondylosis without myelopathy or radiculopathy, lumbar region: Secondary | ICD-10-CM | POA: Insufficient documentation

## 2020-07-21 DIAGNOSIS — G894 Chronic pain syndrome: Secondary | ICD-10-CM | POA: Insufficient documentation

## 2020-07-21 MED ORDER — DEXAMETHASONE SODIUM PHOSPHATE 10 MG/ML IJ SOLN
10.0000 mg | Freq: Once | INTRAMUSCULAR | Status: AC
  Administered 2020-07-21: 10 mg
  Filled 2020-07-21: qty 1

## 2020-07-21 MED ORDER — LIDOCAINE HCL 2 % IJ SOLN
20.0000 mL | Freq: Once | INTRAMUSCULAR | Status: AC
  Administered 2020-07-21: 100 mg
  Filled 2020-07-21: qty 20

## 2020-07-21 MED ORDER — ROPIVACAINE HCL 2 MG/ML IJ SOLN
9.0000 mL | Freq: Once | INTRAMUSCULAR | Status: AC
  Administered 2020-07-21: 9 mL via PERINEURAL
  Filled 2020-07-21: qty 10

## 2020-07-21 NOTE — Patient Instructions (Signed)
Pain Management Discharge Instructions  General Discharge Instructions :  If you need to reach your doctor call: Monday-Friday 8:00 am - 4:00 pm at [phone] or toll free [phone].  After clinic hours [phone] to have operator reach doctor.  Bring all of your medication bottles to all your appointments in the pain clinic.  To cancel or reschedule your appointment with Pain Management please remember to call 24 hours in advance to avoid a fee.  Refer to the educational materials which you have been given on: General Risks, I had my Procedure. Discharge Instructions, Post Sedation.  Post Procedure Instructions:  The drugs you were given will stay in your system until tomorrow, so for the next 24 hours you should not drive, make any legal decisions or drink any alcoholic beverages.  You may eat anything you prefer, but it is better to start with liquids then soups and crackers, and gradually work up to solid foods.  Please notify your doctor immediately if you have any unusual bleeding, trouble breathing or pain that is not related to your normal pain.  Depending on the type of procedure that was done, some parts of your body may feel week and/or numb.  This usually clears up by tonight or the next day.  Walk with the use of an assistive device or accompanied by an adult for the 24 hours.  You may use ice on the affected area for the first 24 hours.  Put ice in a Ziploc bag and cover with a towel and place against area 15 minutes on 15 minutes off.  You may switch to heat after 24 hours.Facet Blocks Patient Information  Description: The facets are joints in the spine between the vertebrae.  Like any joints in the body, facets can become irritated and painful.  Arthritis can also effect the facets.  By injecting steroids and local anesthetic in and around these joints, we can temporarily block the nerve supply to them.  Steroids act directly on irritated nerves and tissues to  reduce selling and inflammation which often leads to decreased pain.  Facet blocks may be done anywhere along the spine from the neck to the low back depending upon the location of your pain.   After numbing the skin with local anesthetic (like Novocaine), a small needle is passed onto the facet joints under x-ray guidance.  You may experience a sensation of pressure while this is being done.  The entire block usually lasts about 15-25 minutes.   Conditions which may be treated by facet blocks:   Low back/buttock pain  Neck/shoulder pain  Certain types of headaches  Preparation for the injection:  1. Do not eat any solid food or dairy products within 8 hours of your appointment. 2. You may drink clear liquid up to 3 hours before appointment.  Clear liquids include water, black coffee, juice or soda.  No milk or cream please. 3. You may take your regular medication, including pain medications, with a sip of water before your appointment.  Diabetics should hold regular insulin (if taken separately) and take 1/2 normal NPH dose the morning of the procedure.  Carry some sugar containing items with you to your appointment. 4. A driver must accompany you and be prepared to drive you home after your procedure. 5. Bring all your current medications with you. 6. An IV may be inserted and sedation may be given at the discretion of the physician. 7. A blood pressure cuff, EKG and other monitors will often be   applied during the procedure.  Some patients may need to have extra oxygen administered for a short period. 8. You will be asked to provide medical information, including your allergies and medications, prior to the procedure.  We must know immediately if you are taking blood thinners (like Coumadin/Warfarin) or if you are allergic to IV iodine contrast (dye).  We must know if you could possible be pregnant.  Possible side-effects:   Bleeding from needle site  Infection (rare, may require  surgery)  Nerve injury (rare)  Numbness & tingling (temporary)  Difficulty urinating (rare, temporary)  Spinal headache (a headache worse with upright posture)  Light-headedness (temporary)  Pain at injection site (serveral days)  Decreased blood pressure (rare, temporary)  Weakness in arm/leg (temporary)  Pressure sensation in back/neck (temporary)   Call if you experience:   Fever/chills associated with headache or increased back/neck pain  Headache worsened by an upright position  New onset, weakness or numbness of an extremity below the injection site  Hives or difficulty breathing (go to the emergency room)  Inflammation or drainage at the injection site(s)  Severe back/neck pain greater than usual  New symptoms which are concerning to you  Please note:  Although the local anesthetic injected can often make your back or neck feel good for several hours after the injection, the pain will likely return. It takes 3-7 days for steroids to work.  You may not notice any pain relief for at least one week.  If effective, we will often do a series of 2-3 injections spaced 3-6 weeks apart to maximally decrease your pain.  After the initial series, you may be a candidate for a more permanent nerve block of the facets.  If you have any questions, please call [phone] [facility] 

## 2020-07-21 NOTE — Progress Notes (Signed)
Safety precautions to be maintained throughout the outpatient stay will include: orient to surroundings, keep bed in low position, maintain call bell within reach at all times, provide assistance with transfer out of bed and ambulation.  

## 2020-07-21 NOTE — Progress Notes (Signed)
Patient's Name: Candace Clayton  MRN: [id number]  Referring Provider: Gillis Santa, MD  DOB: 1960-07-24  PCP: Ronnell Freshwater, NP  DOS: 07/21/2020  Note by: Gillis Santa, MD  Service setting: Ambulatory outpatient  Specialty: Interventional Pain Management  Patient type: Established  Location: [facility] (AMB) Pain Management Facility  Visit type: Interventional Procedure   Primary Reason for Visit: Interventional Pain Management Treatment. CC: Back Pain (Low center)  Procedure:          Anesthesia, Analgesia, Anxiolysis:  Type: Lumbar Facet, Medial Branch Block(s) Primary Purpose: Therapeutic Region: Posterolateral Lumbosacral Spine Level:  L3, L4, L5,  Medial Branch Level(s). Injecting these levels blocks the L3-4, L4-5, and L5-S1 lumbar facet joints. Laterality: Bilateral   (s/p R L3,4,5,S1 06/2018, 05/05/2019 LEFT L3,4,5 RFA done 12/2018, 05/03/20)  Type: Local Anesthesia  Local Anesthetic: Lidocaine 1-2%  Position: Prone   Indications: 1. Lumbar facet arthropathy (R>L)   2. Lumbar spondylosis   3. Chronic pain syndrome    Pain Score: Pre-procedure: 9 /10 Post-procedure: 0-No pain/10  Pre-op Assessment:  Candace Clayton is a 60 y.o. (year old), female patient, seen today for interventional treatment. She  has a past surgical history that includes Cesarean section (1992); Finger surgery (Left, 2011); Hematoma evacuation (Left, 1999); Colonoscopy; Laparoscopic salpingo oophorectomy (Left, 04/02/2018); and Colonoscopy with propofol (N/A, 03/14/2019). Candace Clayton has a current medication list which includes the following prescription(s): albuterol, buspirone, cetirizine, desvenlafaxine, enalapril, ergocalciferol, fluticasone, contour next test, hydrochlorothiazide, hydroxyzine, metformin, metoprolol tartrate, microlet lancets, multiple vitamins-minerals, NON FORMULARY, OVER THE COUNTER MEDICATION, oxycodone-acetaminophen, [START ON 08/06/2020] oxycodone-acetaminophen, [START ON 09/05/2020]  oxycodone-acetaminophen, pregabalin, rosuvastatin, anoro ellipta, and sulfamethoxazole-trimethoprim. Her primarily concern today is the Back Pain (Low center)  Initial Vital Signs:  Pulse/HCG Rate: (!) 59ECG Heart Rate: (!) 53 Temp: (!) 97.1 F (36.2 C) Resp: 18 BP: 137/80 SpO2: 99 %  BMI: Estimated body mass index is 47.26 kg/m as calculated from the following:   Height as of this encounter: 5\' 9"  (1.753 m).   Weight as of this encounter: 320 lb (145.2 kg).  Risk Assessment: Allergies: Reviewed. She is allergic to gabapentin.  Allergy Precautions: None required Coagulopathies: Reviewed. None identified.  Blood-thinner therapy: None at this time Active Infection(s): Reviewed. None identified. Candace Clayton is afebrile  Site Confirmation: Candace Clayton was asked to confirm the procedure and laterality before marking the site Procedure checklist: Completed Consent: Before the procedure and under the influence of no sedative(s), amnesic(s), or anxiolytics, the patient was informed of the treatment options, risks and possible complications. To fulfill our ethical and legal obligations, as recommended by the American Medical Association's Code of Ethics, I have informed the patient of my clinical impression; the nature and purpose of the treatment or procedure; the risks, benefits, and possible complications of the intervention; the alternatives, including doing nothing; the risk(s) and benefit(s) of the alternative treatment(s) or procedure(s); and the risk(s) and benefit(s) of doing nothing. The patient was provided information about the general risks and possible complications associated with the procedure. These may include, but are not limited to: failure to achieve desired goals, infection, bleeding, organ or nerve damage, allergic reactions, paralysis, and death. In addition, the patient was informed of those risks and complications associated to Spine-related procedures, such as failure to  decrease pain; infection (i.e.: Meningitis, epidural or intraspinal abscess); bleeding (i.e.: epidural hematoma, subarachnoid hemorrhage, or any other type of intraspinal or peri-dural bleeding); organ or nerve damage (i.e.: Any type of peripheral nerve, nerve root, or  spinal cord injury) with subsequent damage to sensory, motor, and/or autonomic systems, resulting in permanent pain, numbness, and/or weakness of one or several areas of the body; allergic reactions; (i.e.: anaphylactic reaction); and/or death. Furthermore, the patient was informed of those risks and complications associated with the medications. These include, but are not limited to: allergic reactions (i.e.: anaphylactic or anaphylactoid reaction(s)); adrenal axis suppression; blood sugar elevation that in diabetics may result in ketoacidosis or comma; water retention that in patients with history of congestive heart failure may result in shortness of breath, pulmonary edema, and decompensation with resultant heart failure; weight gain; swelling or edema; medication-induced neural toxicity; particulate matter embolism and blood vessel occlusion with resultant organ, and/or nervous system infarction; and/or aseptic necrosis of one or more joints. Finally, the patient was informed that Medicine is not an exact science; therefore, there is also the possibility of unforeseen or unpredictable risks and/or possible complications that may result in a catastrophic outcome. The patient indicated having understood very clearly. We have given the patient no guarantees and we have made no promises. Enough time was given to the patient to ask questions, all of which were answered to the patient's satisfaction. Candace Clayton has indicated that she wanted to continue with the procedure. Attestation: I, the ordering provider, attest that I have discussed with the patient the benefits, risks, side-effects, alternatives, likelihood of achieving goals, and potential  problems during recovery for the procedure that I have provided informed consent. Date  Time: 07/21/2020  9:28 AM  Pre-Procedure Preparation:  Monitoring: As per clinic protocol. Respiration, ETCO2, SpO2, BP, heart rate and rhythm monitor placed and checked for adequate function Safety Precautions: Patient was assessed for positional comfort and pressure points before starting the procedure. Time-out: I initiated and conducted the "Time-out" before starting the procedure, as per protocol. The patient was asked to participate by confirming the accuracy of the "Time Out" information. Verification of the correct person, site, and procedure were performed and confirmed by me, the nursing staff, and the patient. "Time-out" conducted as per Joint Commission's Universal Protocol (UP.01.01.01). Time: 1033  Description of Procedure:          Laterality: Bilateral. The procedure was performed in identical fashion on both sides. Levels:  L3, L4, L5, & S1 Medial Branch Level(s) Area Prepped: Posterior Lumbosacral Region Prepping solution: ChloraPrep (2% chlorhexidine gluconate and 70% isopropyl alcohol) Safety Precautions: Aspiration looking for blood return was conducted prior to all injections. At no point did we inject any substances, as a needle was being advanced. Before injecting, the patient was told to immediately notify me if she was experiencing any new onset of "ringing in the ears, or metallic taste in the mouth". No attempts were made at seeking any paresthesias. Safe injection practices and needle disposal techniques used. Medications properly checked for expiration dates. SDV (single dose vial) medications used. After the completion of the procedure, all disposable equipment used was discarded in the proper designated medical waste containers. Local Anesthesia: Protocol guidelines were followed. The patient was positioned over the fluoroscopy table. The area was prepped in the usual manner. The  time-out was completed. The target area was identified using fluoroscopy. A 12-in long, straight, sterile hemostat was used with fluoroscopic guidance to locate the targets for each level blocked. Once located, the skin was marked with an approved surgical skin marker. Once all sites were marked, the skin (epidermis, dermis, and hypodermis), as well as deeper tissues (fat, connective tissue and muscle) were infiltrated with  a small amount of a short-acting local anesthetic, loaded on a 10cc syringe with a 25G, 1.5-in  Needle. An appropriate amount of time was allowed for local anesthetics to take effect before proceeding to the next step. Local Anesthetic: Lidocaine 2.0% The unused portion of the local anesthetic was discarded in the proper designated containers. Technical explanation of process:  L3 Medial Branch Nerve Block (MBB): The target area for the L3 medial branch is at the junction of the postero-lateral aspect of the superior articular process and the superior, posterior, and medial edge of the transverse process of L4. Under fluoroscopic guidance, a Quincke needle was inserted until contact was made with os over the superior postero-lateral aspect of the pedicular shadow (target area). After negative aspiration for blood, 1 mL of the nerve block solution was injected without difficulty or complication. The needle was removed intact. L4 Medial Branch Nerve Block (MBB): The target area for the L4 medial branch is at the junction of the postero-lateral aspect of the superior articular process and the superior, posterior, and medial edge of the transverse process of L5. Under fluoroscopic guidance, a Quincke needle was inserted until contact was made with os over the superior postero-lateral aspect of the pedicular shadow (target area). After negative aspiration for blood,1 mL of the nerve block solution was injected without difficulty or complication. The needle was removed intact. L5 Medial Branch  Nerve Block (MBB): The target area for the L5 medial branch is at the junction of the postero-lateral aspect of the superior articular process and the superior, posterior, and medial edge of the sacral ala. Under fluoroscopic guidance, a Quincke needle was inserted until contact was made with os over the superior postero-lateral aspect of the pedicular shadow (target area). After negative aspiration for blood, 51mL of the nerve block solution was injected without difficulty or complication. The needle was removed intact. S1 Medial Branch Nerve Block (MBB): The target area for the S1 medial branch is at the posterior and inferior 6 o'clock position of the L5-S1 facet joint. Under fluoroscopic guidance, the Quincke needle inserted for the L5 MBB was redirected until contact was made with os over the inferior and postero aspect of the sacrum, at the 6 o' clock position under the L5-S1 facet joint (Target area). After negative aspiration for blood, 43mL of the nerve block solution was injected without difficulty or complication. The needle was removed intact. Procedural Needles: 22-gauge, 5-inch, Quincke needles used for all levels. Nerve block solution: 10 cc solution made of 8 cc of 0.2% ropivacaine, 2 cc of Decadron 10 mg/cc. 1-1.5 cc injected at each level above bilaterally. The unused portion of the solution was discarded in the proper designated containers.  Once the entire procedure was completed, the treated area was cleaned, making sure to leave some of the prepping solution back to take advantage of its long term bactericidal properties.   Illustration of the posterior view of the lumbar spine and the posterior neural structures. Laminae of L2 through S1 are labeled. DPRL5, dorsal primary ramus of L5; DPRS1, dorsal primary ramus of S1; DPR3, dorsal primary ramus of L3; FJ, facet (zygapophyseal) joint L3-L4; I, inferior articular process of L4; LB1, lateral branch of dorsal primary ramus of L1; IAB,  inferior articular branches from L3 medial branch (supplies L4-L5 facet joint); IBP, intermediate branch plexus; MB3, medial branch of dorsal primary ramus of L3; NR3, third lumbar nerve root; S, superior articular process of L5; SAB, superior articular branches from L4 (supplies L4-5  facet joint also); TP3, transverse process of L3.  Vitals:   07/21/20 1032 07/21/20 1037 07/21/20 1042 07/21/20 1049  BP: 134/79 135/67 (!) 142/71 (!) 148/77  Pulse:      Resp: 18 16 18 16   Temp:      SpO2: 98% 97% 98% 100%  Weight:      Height:         Start Time: 1033 hrs. End Time: 1049 hrs.  Imaging Guidance (Spinal):          Type of Imaging Technique: Fluoroscopy Guidance (Spinal) Indication(s): Assistance in needle guidance and placement for procedures requiring needle placement in or near specific anatomical locations not easily accessible without such assistance. Exposure Time: Please see nurses notes. Contrast: None used. Fluoroscopic Guidance: I was personally present during the use of fluoroscopy. "Tunnel Vision Technique" used to obtain the best possible view of the target area. Parallax error corrected before commencing the procedure. "Direction-depth-direction" technique used to introduce the needle under continuous pulsed fluoroscopy. Once target was reached, antero-posterior, oblique, and lateral fluoroscopic projection used confirm needle placement in all planes. Images permanently stored in EMR. Interpretation: No contrast injected. I personally interpreted the imaging intraoperatively. Adequate needle placement confirmed in multiple planes. Permanent images saved into the patient's record.  Antibiotic Prophylaxis:   Anti-infectives (From admission, onward)   None     Indication(s): None identified  Post-operative Assessment:  Post-procedure Vital Signs:  Pulse/HCG Rate: (!) 5960 Temp: (!) 97.1 F (36.2 C) Resp: 16 BP: (!) 148/77 SpO2: 100 %  EBL: None  Complications: No  immediate post-treatment complications observed by team, or reported by patient.  Note: The patient tolerated the entire procedure well. A repeat set of vitals were taken after the procedure and the patient was kept under observation following institutional policy, for this type of procedure. Post-procedural neurological assessment was performed, showing return to baseline, prior to discharge. The patient was provided with post-procedure discharge instructions, including a section on how to identify potential problems. Should any problems arise concerning this procedure, the patient was given instructions to immediately contact us, at any time, without hesitation. In any case, we plan to contact the patient by telephone for a follow-up status report regarding this interventional procedure.  Comments:  No additional relevant information.  Plan of Care    Imaging Orders     DG PAIN CLINIC C-ARM 1-60 MIN NO REPORT Procedure Orders    No procedure(s) ordered today     Requested Prescriptions    No prescriptions requested or ordered in this encounter     Medications ordered for procedure: Meds ordered this encounter  Medications  . lidocaine (XYLOCAINE) 2 % (with pres) injection 400 mg  . ropivacaine (PF) 2 mg/mL (0.2%) (NAROPIN) injection 9 mL  . ropivacaine (PF) 2 mg/mL (0.2%) (NAROPIN) injection 9 mL  . dexamethasone (DECADRON) injection 10 mg  . dexamethasone (DECADRON) injection 10 mg   Medications administered: We administered lidocaine, ropivacaine (PF) 2 mg/mL (0.2%), ropivacaine (PF) 2 mg/mL (0.2%), dexamethasone, and dexamethasone.  See the medical record for exact dosing, route, and time of administration.  Disposition: Discharge home  Discharge Date & Time: 07/21/2020;   hrs.   Physician-requested Follow-up: Return in about 4 weeks (around 08/18/2020) for Post Procedure Evaluation, virtual.  Future Appointments  Date Time Provider [place]  08/05/2020 10:15 AM  Allyne Gee, MD [facility] None  08/13/2020  2:20 PM [facility]  08/16/2020  3:20 PM Milinda Pointer, MD [facility] None  08/24/2020 10:15 AM Luiz Ochoa, NP [facility] None  09/01/2020 12:00 PM [facility] None  10/19/2020  9:40 AM Gillis Santa, MD [facility] None  05/17/2021  9:00 AM Ronnell Freshwater, NP [place] None   Primary Care Physician: Ronnell Freshwater, NP Location: [facility] Note by: Gillis Santa, MD Date: 07/21/2020; Time: 12:58 PM  Disclaimer:  Medicine is not an exact science. The only guarantee in medicine is that nothing is guaranteed. It is important to note that the decision to proceed with this intervention was based on the information collected from the patient. The Data and conclusions were drawn from the patient's questionnaire, the interview, and the physical examination. Because the information was provided in large part by the patient, it cannot be guaranteed that it has not been purposely or unconsciously manipulated. Every effort has been made to obtain as much relevant data as possible for this evaluation. It is important to note that the conclusions that lead to this procedure are derived in large part from the available data. Always take into account that the treatment will also be dependent on availability of resources and existing treatment guidelines, considered by other Pain Management Practitioners as being common knowledge and practice, at the time of the intervention. For Medico-Legal purposes, it is also important to point out that variation in procedural techniques and pharmacological choices are the acceptable norm. The indications, contraindications, technique, and results of the above procedure should only be interpreted and judged by a Board-Certified Interventional Pain Specialist with extensive familiarity and expertise in the same exact procedure and technique.

## 2020-07-22 ENCOUNTER — Telehealth: Payer: Self-pay

## 2020-07-22 ENCOUNTER — Encounter
Payer: No Typology Code available for payment source | Admitting: Student in an Organized Health Care Education/Training Program

## 2020-07-22 NOTE — Telephone Encounter (Signed)
Post procedure follow up.  LM 

## 2020-07-26 ENCOUNTER — Other Ambulatory Visit: Payer: Self-pay

## 2020-07-26 MED ORDER — CONTOUR NEXT TEST VI STRP: 1.0000 | ORAL_STRIP | Freq: Every day | 1 refills | Status: AC

## 2020-08-05 ENCOUNTER — Encounter: Payer: Self-pay | Admitting: Internal Medicine

## 2020-08-05 ENCOUNTER — Other Ambulatory Visit: Payer: Self-pay

## 2020-08-05 ENCOUNTER — Ambulatory Visit: Payer: No Typology Code available for payment source | Admitting: Internal Medicine

## 2020-08-05 VITALS — BP 130/78 | HR 59 | Temp 97.8°F | Resp 16 | Ht 69.0 in | Wt 348.0 lb

## 2020-08-05 DIAGNOSIS — G4733 Obstructive sleep apnea (adult) (pediatric): Secondary | ICD-10-CM | POA: Diagnosis not present

## 2020-08-05 DIAGNOSIS — Z7189 Other specified counseling: Secondary | ICD-10-CM

## 2020-08-05 DIAGNOSIS — R0602 Shortness of breath: Secondary | ICD-10-CM

## 2020-08-05 NOTE — Patient Instructions (Signed)

## 2020-08-05 NOTE — Progress Notes (Signed)
[facility] [place], [place] [address]  Pulmonary Sleep Medicine   Office Visit Note  Patient Name: Candace Clayton DOB: Oct 09, 1960 MRN [id number]  Date of Service: 08/05/2020  Complaints/HPI: OSA. New machine. She has a broken mask. She has not seen her DME provider yet to get a replacement.  Apparently when the machine was working the mask was doing well.  She now needs to get a new device mask set up for her.  ROS  General: (-) fever, (-) chills, (-) night sweats, (-) weakness Skin: (-) rashes, (-) itching,. Eyes: (-) visual changes, (-) redness, (-) itching. Nose and Sinuses: (-) nasal stuffiness or itchiness, (-) postnasal drip, (-) nosebleeds, (-) sinus trouble. Mouth and Throat: (-) sore throat, (-) hoarseness. Neck: (-) swollen glands, (-) enlarged thyroid, (-) neck pain. Respiratory: - cough, (-) bloody sputum, - shortness of breath, - wheezing. Cardiovascular: - ankle swelling, (-) chest pain. Lymphatic: (-) lymph node enlargement. Neurologic: (-) numbness, (-) tingling. Psychiatric: (-) anxiety, (-) depression   Current Medication: Outpatient Encounter Medications as of 08/05/2020  Medication Sig  . albuterol (VENTOLIN HFA) 108 (90 Base) MCG/ACT inhaler TAKE 2 PUFFS BY MOUTH EVERY 6 HOURS AS NEEDED FOR WHEEZE OR SHORTNESS OF BREATH  . busPIRone (BUSPAR) 15 MG tablet Take 1 tablet (15 mg total) by mouth 3 (three) times daily. Take one tab po bid for one week, then increase to tid for anxiety  . cetirizine (ZYRTEC) 10 MG tablet TAKE 1 TABLET(S) BY MOUTH DAILY FOR ALLERGIES  . desvenlafaxine (PRISTIQ) 100 MG 24 hr tablet Take 1 tablet (100 mg total) by mouth daily.  . enalapril (VASOTEC) 20 MG tablet Take 1 tablet (20 mg total) by mouth 2 (two) times daily.  . ergocalciferol (DRISDOL) 1.25 MG (50000 UT) capsule Take 1 capsule (50,000 Units total) by mouth once a week.  . fluticasone (FLONASE) 50 MCG/ACT nasal spray Place 2 sprays into both  nostrils daily.  Marland Kitchen glucose blood (CONTOUR NEXT TEST) test strip 1 each by Other route daily. DX E11.65  . hydrochlorothiazide (HYDRODIURIL) 25 MG tablet Take 1 tablet (25 mg total) by mouth daily.  . hydrOXYzine (ATARAX/VISTARIL) 10 MG tablet Take 1 tablet (10 mg total) by mouth 3 (three) times daily as needed.  . metFORMIN (GLUCOPHAGE) 500 MG tablet Take 1 tablet (500 mg total) by mouth 2 (two) times daily with a meal.  . metoprolol tartrate (LOPRESSOR) 25 MG tablet Take 2 tablets (50 mg total) by mouth 2 (two) times daily.  . Microlet Lancets MISC 1 each by Does not apply route daily. [id number]  . Multiple Vitamins-Calcium (ONE-A-DAY WOMENS PO) Take 1 tablet by mouth daily.  . NON FORMULARY cpap device  . OVER THE COUNTER MEDICATION 1 tablet daily as needed. Takes generic stool softener for constipation d/t oxycodone.   Marland Kitchen oxyCODONE-acetaminophen (PERCOCET/ROXICET) 5-325 MG tablet Take 1 tablet by mouth every 8 (eight) hours as needed for severe pain.  Derrill Memo ON 08/06/2020] oxyCODONE-acetaminophen (PERCOCET/ROXICET) 5-325 MG tablet Take 1 tablet by mouth every 8 (eight) hours as needed for severe pain.  Derrill Memo ON 09/05/2020] oxyCODONE-acetaminophen (PERCOCET/ROXICET) 5-325 MG tablet Take 1 tablet by mouth every 8 (eight) hours as needed for severe pain.  . pregabalin (LYRICA) 50 MG capsule Take 2 capsules (100 mg total) by mouth 2 (two) times daily.  . rosuvastatin (CRESTOR) 5 MG tablet Take 1 tablet (5 mg total) by mouth daily.  Marland Kitchen umeclidinium-vilanterol (ANORO ELLIPTA) 62.5-25 MCG/INH AEPB Inhale 1 puff  into the lungs daily.  . [DISCONTINUED] sulfamethoxazole-trimethoprim (BACTRIM DS) 800-160 MG tablet Take 1 tablet by mouth 2 (two) times daily.   No facility-administered encounter medications on file as of 08/05/2020.    Surgical History: Past Surgical History:  Procedure Laterality Date  . CESAREAN SECTION  1992  . COLONOSCOPY     polyps removed first procedure. 2nd time all was clear   . COLONOSCOPY WITH PROPOFOL N/A 03/14/2019   Procedure: COLONOSCOPY WITH PROPOFOL;  Surgeon: Lin Landsman, MD;  Location: [facility] ENDOSCOPY;  Service: Gastroenterology;  Laterality: N/A;  . FINGER SURGERY Left 2011   left finger cut off x 2.(only up to last digit)  . HEMATOMA EVACUATION Left 1999   upper part of foot was injured d/t 500lb weight landing on her foot.   Marland Kitchen LAPAROSCOPIC SALPINGO OOPHERECTOMY Left 04/02/2018   Procedure: LAPAROSCOPIC SALPINGO OOPHORECTOMY;  Surgeon: Malachy Mood, MD;  Location: [facility] ORS;  Service: Gynecology;  Laterality: Left;    Medical History: Past Medical History:  Diagnosis Date  . Anginal pain (HCC)    tightness related to anxiety  . Anxiety   . Depression   . Diabetes mellitus without complication ([place])   . GERD (gastroesophageal reflux disease)    throws up easily but not diagnosed with reflux  . History of C-section 12  . Hypertension   . Sleep apnea    uses cpap    Family History: Family History  Problem Relation Age of Onset  . Breast cancer Mother 40       x 3 times  . Colon cancer Mother   . Brain cancer Mother   . Cancer - Colon Mother   . Cancer - Other Mother   . Heart Problems Father   . Colon cancer Brother   . Heart Problems Brother     Social History: Social History   Socioeconomic History  . Marital status: Divorced    Spouse name: Not on file  . Number of children: 1  . Years of education: Not on file  . Highest education level: Not on file  Occupational History  . Occupation: works in Secretary/administrator  Tobacco Use  . Smoking status: Never Smoker  . Smokeless tobacco: Never Used  Vaping Use  . Vaping Use: Never used  Substance and Sexual Activity  . Alcohol use: No  . Drug use: No  . Sexual activity: Not Currently    Birth control/protection: Post-menopausal  Other Topics Concern  . Not on file  Social History Narrative   Son has schizophrenia and autism but is fully capable of helping  mother after surgery   Social Determinants of Health   Financial Resource Strain: Not on file  Food Insecurity: Not on file  Transportation Needs: Not on file  Physical Activity: Not on file  Stress: Not on file  Social Connections: Not on file  Intimate Partner Violence: Not on file    Vital Signs: Blood pressure 130/78, pulse (!) 59, temperature 97.8 F (36.6 C), resp. rate 16, height 5\' 9"  (1.753 m), weight (!) 348 lb (157.9 kg), SpO2 97 %.  Examination: General Appearance: The patient is well-developed, well-nourished, and in no distress. Skin: Gross inspection of skin unremarkable. Head: normocephalic, no gross deformities. Eyes: no gross deformities noted. ENT: ears appear grossly normal no exudates. Neck: Supple. No thyromegaly. No LAD. Respiratory: no rhonchi noted. Cardiovascular: Normal S1 and S2 without murmur or rub. Extremities: No cyanosis. pulses are equal. Neurologic: Alert and oriented. No involuntary movements.  LABS: Recent Results (from the past 2160 hour(s))  POCT HgB A1C     Status: Abnormal   Collection Time: 05/17/20  9:23 AM  Result Value Ref Range   Hemoglobin A1C 7.2 (A) 4.0 - 5.6 %   HbA1c POC (<> result, manual entry)     HbA1c, POC (prediabetic range)     HbA1c, POC (controlled diabetic range)    UA/M w/rflx Culture, Routine     Status: Abnormal   Collection Time: 05/17/20 10:26 AM   Specimen: Urine   Urine  Result Value Ref Range   Specific Gravity, UA 1.025 1.005 - 1.030   pH, UA 5.0 5.0 - 7.5   Color, UA Yellow Yellow   Appearance Ur Turbid (A) Clear   Leukocytes,UA Negative Negative   Protein,UA Negative Negative/Trace   Glucose, UA Negative Negative   Ketones, UA Negative Negative   RBC, UA Negative Negative   Bilirubin, UA Negative Negative   Urobilinogen, Ur 0.2 0.2 - 1.0 mg/dL   Nitrite, UA Negative Negative   Microscopic Examination Comment     Comment: Microscopic follows if indicated.   Microscopic Examination See  below:     Comment: Microscopic was indicated and was performed.   Urinalysis Reflex Comment     Comment: This specimen will not reflex to a Urine Culture.  Microscopic Examination     Status: Abnormal   Collection Time: 05/17/20 10:26 AM   Urine  Result Value Ref Range   WBC, UA None seen 0 - 5 /hpf   RBC None seen 0 - 2 /hpf   Epithelial Cells (non renal) >10 (A) 0 - 10 /hpf   Casts None seen None seen /lpf   Bacteria, UA Few None seen/Few  Urinalysis, Complete w Microscopic     Status: Abnormal   Collection Time: 06/01/20 10:42 AM  Result Value Ref Range   Color, Urine YELLOW YELLOW   APPearance HAZY (A) CLEAR   Specific Gravity, Urine >1.030 (H) 1.005 - 1.030   pH 5.5 5.0 - 8.0   Glucose, UA NEGATIVE NEGATIVE mg/dL   Hgb urine dipstick NEGATIVE NEGATIVE   Bilirubin Urine SMALL (A) NEGATIVE   Ketones, ur TRACE (A) NEGATIVE mg/dL   Protein, ur NEGATIVE NEGATIVE mg/dL   Nitrite NEGATIVE NEGATIVE   Leukocytes,Ua NEGATIVE NEGATIVE   Squamous Epithelial / LPF >50 0 - 5   WBC, UA 0-5 0 - 5 WBC/hpf   RBC / HPF NONE SEEN 0 - 5 RBC/hpf   Bacteria, UA FEW (A) NONE SEEN    Comment: Performed at [facility], [address] [place], [place] [address]  BLADDER SCAN AMB NON-IMAGING     Status: None   Collection Time: 06/01/20 11:04 AM  Result Value Ref Range   Scan Result 75ml     Radiology: DG PAIN CLINIC C-ARM 1-60 MIN NO REPORT  Result Date: 07/21/2020 Fluoro was used, but no Radiologist interpretation will be provided. Please refer to "NOTES" tab for provider progress note.   No results found.  DG PAIN CLINIC C-ARM 1-60 MIN NO REPORT  Result Date: 07/21/2020 Fluoro was used, but no Radiologist interpretation will be provided. Please refer to "NOTES" tab for provider progress note.     Assessment and Plan: Patient Active Problem List   Diagnosis Date Noted  . Encounter for general adult medical examination with abnormal findings 05/17/2020  . Acute  non-recurrent pansinusitis 05/17/2020  . Vasomotor rhinitis 05/17/2020  . Mixed hyperlipidemia 05/17/2020  . Encounter  for screening mammogram for malignant neoplasm of breast 05/17/2020  . Arthritis of right knee 04/01/2020  . Pneumonia of both lower lobes due to infectious organism 02/18/2020  . Chest pain 02/13/2020  . Shortness of breath 02/11/2020  . Multiple closed fractures of ribs of right side 02/11/2020  . Calculus of gallbladder without cholecystitis without obstruction 02/11/2020  . 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 Multiple closed fractures of ribs of left side 01/27/2020  . Overactive bladder 12/28/2019  . Dysuria 10/05/2019  . Type 2 diabetes mellitus with hyperglycemia, without long-term current use of insulin ([place]) 10/05/2019  . Abnormal fasting glucose 10/05/2019  . Bladder spasm 06/02/2019  . Family history of colon cancer in mother   . Right lower quadrant pain 03/12/2019  . History of ovarian cyst 03/12/2019  . Chronic pain syndrome 03/05/2019  . Seasonal allergic rhinitis due to pollen 08/26/2018  . Lumbar facet arthropathy (R>L) 08/22/2018  . Lumbar spondylosis 08/22/2018  . Left ovarian cyst 03/03/2018  . Lumbar degenerative disc disease 12/23/2017  . Encounter for screening colonoscopy 10/24/2017  . Low back pain with sciatica 07/03/2017  . Allergic rhinitis, unspecified 07/03/2017  . Pain in unspecified knee 07/03/2017  . OSA on CPAP 07/03/2017  . Sedative, hypnotic, or anxiolytic use, unspecified, uncomplicated 38/25/0539  . Hypersomnia 06/27/2017  . Major depressive disorder, recurrent episode, moderate ([place]) 06/27/2017  . Essential (primary) hypertension 06/27/2017  . Morbid obesity ([place]) 06/27/2017  . GAD (generalized anxiety disorder) 06/27/2017  . Urinary tract infection 06/27/2017     1. Shortness of breath Multiple factors including her obesity also with 2 pulmonary function assessment to make certain that she  does not have any obstructive lung disease. - Spirometry with Graph - Pulmonary function test; Future  2. OSA (obstructive sleep apnea) She supposed to be on CPAP but not using it right now because of broken mask.  Spoke with her at length about importance of compliance she is going to see her DME provider and get new mask set up for herself  3. Obesity, morbid ([place]) Obesity Counseling: Had a lengthy discussion regarding patients BMI and weight issues. Patient was instructed on portion control as well as increased activity. Also discussed caloric restrictions with trying to maintain intake less than 2000 Kcal. Discussions were made in accordance with the 5As of weight management. Simple actions such as not eating late and if able to, taking a walk is suggested.  4. CPAP use counseling CPAP Counseling: had a lengthy discussion with the patient regarding the importance of PAP therapy in management of the sleep apnea. Patient appears to understand the risk factor reduction and also understands the risks associated with untreated sleep apnea. Patient will try to make a good faith effort to remain compliant with therapy. Also instructed the patient on proper cleaning of the device including the water must be changed daily if possible and use of distilled water is preferred. Patient understands that the machine should be regularly cleaned with appropriate recommended cleaning solutions that do not damage the PAP machine for example given white vinegar and water rinses. Other methods such as ozone treatment may not be as good as these simple methods to achieve cleaning.   General Counseling: I have discussed the findings of the evaluation and examination with Vedha.  I have also discussed any further diagnostic evaluation thatmay be needed or ordered today. Gazella verbalizes understanding of the findings of todays visit. We also reviewed her medications today and discussed  drug interactions and side effects  including but not limited excessive drowsiness and altered mental states. We also discussed that there is always a risk not just to her but also people around her. she has been encouraged to call the office with any questions or concerns that should arise related to todays visit.  Orders Placed This Encounter  Procedures  . Spirometry with Graph    Order Specific Question:   Where should this test be performed?    Answer:   Other    Order Specific Question:   Spirometry pre & post bronchodilator    Answer:   Yes     Time spent: 35 minutes  I have personally obtained a history, examined the patient, evaluated laboratory and imaging results, formulated the assessment and plan and placed orders.    Allyne Gee, MD [facility] Pulmonary and Critical Care Sleep medicine

## 2020-08-08 NOTE — Progress Notes (Signed)
08/09/2020 1:44 PM   Candace Clayton 08-14-1960 [id number]  Referring provider: Ronnell Freshwater, NP [place],  [name] [address]  Chief Complaint  Patient presents with  . Follow-up    86mth follow-up   Urological history: 1. OAB - failed oxybutynin - managed with Myrbetriq 50 mg daily  HPI: Candace Clayton is a 60 y.o. female who presents today for follow up after a trial of Myrbetriq 50 mg daily.  She noted no difference with the Myrbetriq 50 mg with her urinary symptoms.  She continues to drink copious amount of fluids due to dry mouth.   The patient is  experiencing urgency x 8 or more, frequency x 8 or more, is restricting fluids to avoid visits to the restroom, is engaging in toilet mapping, incontinence x 8 or more and nocturia x 4-7.   Her BP is 125/78.   Her PVR is 0 mL      Patient denies any modifying or aggravating factors.  Patient denies any gross hematuria, dysuria or suprapubic/flank pain.  Patient denies any fevers, chills, nausea or vomiting.   She states her friend got her bladder tacked up and she is having similar symptoms.    PMH: Past Medical History:  Diagnosis Date  . Anginal pain (HCC)    tightness related to anxiety  . Anxiety   . Depression   . Diabetes mellitus without complication ([place])   . GERD (gastroesophageal reflux disease)    throws up easily but not diagnosed with reflux  . History of C-section 65  . Hypertension   . Sleep apnea    uses cpap    Surgical History: Past Surgical History:  Procedure Laterality Date  . CESAREAN SECTION  1992  . COLONOSCOPY     polyps removed first procedure. 2nd time all was clear  . COLONOSCOPY WITH PROPOFOL N/A 03/14/2019   Procedure: COLONOSCOPY WITH PROPOFOL;  Surgeon: Lin Landsman, MD;  Location: [facility] ENDOSCOPY;  Service: Gastroenterology;  Laterality: N/A;  . FINGER SURGERY Left 2011   left finger cut off x 2.(only up to last digit)  . HEMATOMA EVACUATION  Left 1999   upper part of foot was injured d/t 500lb weight landing on her foot.   Marland Kitchen LAPAROSCOPIC SALPINGO OOPHERECTOMY Left 04/02/2018   Procedure: LAPAROSCOPIC SALPINGO OOPHORECTOMY;  Surgeon: Malachy Mood, MD;  Location: [facility] ORS;  Service: Gynecology;  Laterality: Left;    Home Medications:  Allergies as of 08/09/2020      Reactions   Gabapentin Other (See Comments)   Unknown      Medication List       Accurate as of August 09, 2020  1:44 PM. If you have any questions, ask your nurse or doctor.        albuterol 108 (90 Base) MCG/ACT inhaler Commonly known as: VENTOLIN HFA TAKE 2 PUFFS BY MOUTH EVERY 6 HOURS AS NEEDED FOR WHEEZE OR SHORTNESS OF BREATH   Anoro Ellipta 62.5-25 MCG/INH Aepb Generic drug: umeclidinium-vilanterol Inhale 1 puff into the lungs daily.   busPIRone 15 MG tablet Commonly known as: BUSPAR Take 1 tablet (15 mg total) by mouth 3 (three) times daily. Take one tab po bid for one week, then increase to tid for anxiety   cetirizine 10 MG tablet Commonly known as: ZYRTEC TAKE 1 TABLET(S) BY MOUTH DAILY FOR ALLERGIES   Contour Next Test test strip Generic drug: glucose blood 1 each by Other route daily. DX E11.65  desvenlafaxine 100 MG 24 hr tablet Commonly known as: PRISTIQ Take 1 tablet (100 mg total) by mouth daily.   enalapril 20 MG tablet Commonly known as: VASOTEC Take 1 tablet (20 mg total) by mouth 2 (two) times daily.   ergocalciferol 1.25 MG (50000 UT) capsule Commonly known as: Drisdol Take 1 capsule (50,000 Units total) by mouth once a week.   fluticasone 50 MCG/ACT nasal spray Commonly known as: FLONASE Place 2 sprays into both nostrils daily.   hydrochlorothiazide 25 MG tablet Commonly known as: HYDRODIURIL Take 1 tablet (25 mg total) by mouth daily.   hydrOXYzine 10 MG tablet Commonly known as: ATARAX/VISTARIL Take 1 tablet (10 mg total) by mouth 3 (three) times daily as needed.   metFORMIN 500 MG tablet Commonly  known as: GLUCOPHAGE Take 1 tablet (500 mg total) by mouth 2 (two) times daily with a meal.   metoprolol tartrate 25 MG tablet Commonly known as: LOPRESSOR Take 2 tablets (50 mg total) by mouth 2 (two) times daily.   Microlet Lancets Misc 1 each by Does not apply route daily. DX-E11.65   NON FORMULARY cpap device   ONE-A-DAY WOMENS PO Take 1 tablet by mouth daily.   OVER THE COUNTER MEDICATION 1 tablet daily as needed. Takes generic stool softener for constipation d/t oxycodone.   oxyCODONE-acetaminophen 5-325 MG tablet Commonly known as: PERCOCET/ROXICET Take 1 tablet by mouth every 8 (eight) hours as needed for severe pain. What changed: Another medication with the same name was removed. Continue taking this medication, and follow the directions you see here. Changed by: Zara Council, PA-C   oxyCODONE-acetaminophen 5-325 MG tablet Commonly known as: PERCOCET/ROXICET Take 1 tablet by mouth every 8 (eight) hours as needed for severe pain. Start taking on: September 05, 2020 What changed: Another medication with the same name was removed. Continue taking this medication, and follow the directions you see here. Changed by: Zara Council, PA-C   pregabalin 50 MG capsule Commonly known as: Lyrica Take 2 capsules (100 mg total) by mouth 2 (two) times daily.   rosuvastatin 5 MG tablet Commonly known as: Crestor Take 1 tablet (5 mg total) by mouth daily.       Allergies:  Allergies  Allergen Reactions  . Gabapentin Other (See Comments)    Unknown    Family History: Family History  Problem Relation Age of Onset  . Breast cancer Mother 40       x 3 times  . Colon cancer Mother   . Brain cancer Mother   . Cancer - Colon Mother   . Cancer - Other Mother   . Heart Problems Father   . Colon cancer Brother   . Heart Problems Brother     Social History:  reports that she has never smoked. She has never used smokeless tobacco. She reports that she does not drink  alcohol and does not use drugs.  ROS: Pertinent ROS in HPI  Physical Exam: BP 125/78   Pulse 68   Ht 5\' 9"  (1.753 m)   Wt (!) 344 lb (156 kg)   BMI 50.80 kg/m   Constitutional:  Well nourished. Alert and oriented, No acute distress. HEENT: [place] AT, mask in place.  Trachea midline Cardiovascular: No clubbing, cyanosis, or edema. Respiratory: Normal respiratory effort, no increased work of breathing. GU: No CVA tenderness.  No bladder fullness or masses.  Atrophic external genitalia, normal pubic hair distribution, no lesions.  Normal urethral meatus, no lesions, no prolapse, no discharge.  No urethral masses, tenderness and/or tenderness. No bladder fullness, tenderness or masses. Pale vagina mucosa, fair estrogen effect, no discharge, no lesions, fair pelvic support, grade I cystocele and grade I rectocele noted.   Anus and perineum are without rashes or lesions.    Neurologic: Grossly intact, no focal deficits, moving all 4 extremities. Psychiatric: Normal mood and affect.   Laboratory Data: Lab Results  Component Value Date   WBC 7.4 02/23/2020   HGB 14.0 02/23/2020   HCT 42.6 02/23/2020   MCV 84 02/23/2020   PLT 249 02/23/2020    Lab Results  Component Value Date   CREATININE 0.72 02/23/2020    Lab Results  Component Value Date   HGBA1C 7.2 (A) 05/17/2020    Lab Results  Component Value Date   TSH 1.510 02/23/2020       Component Value Date/Time   CHOL 260 (H) 02/23/2020 1312   HDL 60 02/23/2020 1312   CHOLHDL 4.8 05/22/2017 1537   VLDL 35 05/22/2017 1537   LDLCALC 166 (H) 02/23/2020 1312    Lab Results  Component Value Date   AST 14 02/23/2020   Lab Results  Component Value Date   ALT 24 02/23/2020    Urinalysis    Component Value Date/Time   COLORURINE YELLOW 06/01/2020 1042   APPEARANCEUR HAZY (A) 06/01/2020 1042   APPEARANCEUR Turbid (A) 05/17/2020 1026   LABSPEC >1.030 (H) 06/01/2020 1042   PHURINE 5.5 06/01/2020 1042   GLUCOSEU NEGATIVE  06/01/2020 1042   HGBUR NEGATIVE 06/01/2020 1042   BILIRUBINUR SMALL (A) 06/01/2020 1042   BILIRUBINUR Negative 05/17/2020 1026   KETONESUR TRACE (A) 06/01/2020 1042   PROTEINUR NEGATIVE 06/01/2020 1042   UROBILINOGEN 0.2 09/22/2019 1040   NITRITE NEGATIVE 06/01/2020 1042   LEUKOCYTESUR NEGATIVE 06/01/2020 1042  I have reviewed the labs.   Pertinent Imaging: Results for CHIANNA, SPIRITO (MRN [id number]) as of 08/09/2020 13:46  Ref. Range 08/09/2020 13:14  Scan Result Unknown 0 ml    Assessment & Plan:    1. OAB - failed oxybutynin  - failed Myrbetriq -In regards to her copious amount of fluid intake, I suggested that she suck on sugar-free candy and effort to try to reduce the amount of fluid and may be discussed with her PCP changing some of the medications -In regards to a bladder tacking.  I expressed my concerns regarding her type of incontinence whether it is stress or urge.  I explained how a bladder tacking can make her urge incontinence worse, so she would have to undergo urodynamics for further evaluation of her incontinence.  I also expressed my concern that with her copious amount of fluid intake due to her dry mouth, her medication, her lower back pain issues, her diabetes and unhealthy weight that a bladder tacking would not be the best choice for her.  I stated I would be happy to refer her to Dr. Matilde Sprang for further consideration, but she deferred at this time. -We then discussed PTNS therapy and Botox therapy -explained the PTNS provides treatment by indirectly providing electrical stimulation to the nerves responsible for bladder and pelvic floor function - a needle electrode generates an adjustable electrical pulse that travels to the sacral plexus via the tibial nerve which is located in the ankle, among other functions, the sacral nerve plexus regulates bladder and pelvic floor function - treatment protocol requires once-a-week treatments for 12 weeks, 30 minutes per  session and many patients begin to see improvements by the 6th treatment.  Patients who respond to treatment may require occasional treatments (~ once every 3 weeks) to sustain improvements. PTNS is a low-risk procedure. The most common side-effects with PTNS treatment are temporary and minor, resulting from the placement of the needle electrode. They include minor bleeding, mild pain and skin inflammation and patients have seen up to an 80% success rate with this form of treatment - RTC for PTNS   Return for Janett Billow will call to schedule PTNS .  These notes generated with voice recognition software. I apologize for typographical errors.  Zara Council, PA-C  [facility] [address]  [place], [place] [address] [phone]  I spent 30 minutes on the day of the encounter to include pre-visit record review, face-to-face time with the patient, and post-visit ordering of tests.

## 2020-08-09 ENCOUNTER — Ambulatory Visit (INDEPENDENT_AMBULATORY_CARE_PROVIDER_SITE_OTHER): Payer: Self-pay | Admitting: Urology

## 2020-08-09 ENCOUNTER — Telehealth: Payer: Self-pay | Admitting: Urology

## 2020-08-09 ENCOUNTER — Other Ambulatory Visit: Payer: Self-pay

## 2020-08-09 ENCOUNTER — Encounter: Payer: Self-pay | Admitting: Urology

## 2020-08-09 VITALS — BP 125/78 | HR 68 | Ht 69.0 in | Wt 344.0 lb

## 2020-08-09 DIAGNOSIS — N3281 Overactive bladder: Secondary | ICD-10-CM

## 2020-08-09 LAB — BLADDER SCAN AMB NON-IMAGING: Scan Result: 0

## 2020-08-09 NOTE — Telephone Encounter (Signed)
Would you check with her insurance and see if it covers PTNS?

## 2020-08-11 ENCOUNTER — Other Ambulatory Visit: Payer: Self-pay

## 2020-08-13 ENCOUNTER — Other Ambulatory Visit: Payer: Self-pay

## 2020-08-13 ENCOUNTER — Ambulatory Visit
Admission: RE | Admit: 2020-08-13 | Discharge: 2020-08-13 | Disposition: A | Discharge status: Home / Self Care | Payer: No Typology Code available for payment source | Source: Ambulatory Visit | Attending: Nurse Practitioner | Admitting: Nurse Practitioner

## 2020-08-13 DIAGNOSIS — Z1231 Encounter for screening mammogram for malignant neoplasm of breast: Secondary | ICD-10-CM | POA: Diagnosis not present

## 2020-08-16 ENCOUNTER — Ambulatory Visit: Payer: BC Managed Care – PPO | Admitting: Hospice and Palliative Medicine

## 2020-08-16 ENCOUNTER — Telehealth: Payer: No Typology Code available for payment source | Admitting: Pain Medicine

## 2020-08-17 ENCOUNTER — Telehealth: Payer: Self-pay

## 2020-08-17 ENCOUNTER — Encounter: Payer: Self-pay | Admitting: Pain Medicine

## 2020-08-17 NOTE — Telephone Encounter (Signed)
LM for patient to call the office to go over pre virtual appointment questions.

## 2020-08-17 NOTE — Progress Notes (Deleted)
Wrong physician schedule.

## 2020-08-18 ENCOUNTER — Ambulatory Visit
Payer: No Typology Code available for payment source | Attending: Pain Medicine | Admitting: Student in an Organized Health Care Education/Training Program

## 2020-08-18 ENCOUNTER — Encounter: Payer: No Typology Code available for payment source | Admitting: Pain Medicine

## 2020-08-18 ENCOUNTER — Other Ambulatory Visit: Payer: Self-pay

## 2020-08-18 ENCOUNTER — Encounter: Payer: Self-pay | Admitting: Student in an Organized Health Care Education/Training Program

## 2020-08-18 DIAGNOSIS — M5136 Other intervertebral disc degeneration, lumbar region: Secondary | ICD-10-CM | POA: Diagnosis not present

## 2020-08-18 DIAGNOSIS — M47816 Spondylosis without myelopathy or radiculopathy, lumbar region: Secondary | ICD-10-CM | POA: Diagnosis not present

## 2020-08-18 DIAGNOSIS — M51369 Other intervertebral disc degeneration, lumbar region without mention of lumbar back pain or lower extremity pain: Secondary | ICD-10-CM

## 2020-08-18 DIAGNOSIS — G894 Chronic pain syndrome: Secondary | ICD-10-CM | POA: Diagnosis not present

## 2020-08-18 MED ORDER — PREDNISONE 20 MG PO TABS: ORAL_TABLET | ORAL | 0 refills | Status: AC

## 2020-08-18 MED ORDER — PREGABALIN 100 MG PO CAPS: ORAL_CAPSULE | ORAL | 0 refills | Status: DC

## 2020-08-18 NOTE — Progress Notes (Signed)
Patient: Candace Clayton  Service Category: E/Candace  Provider: Gillis Santa, MD  DOB: [birth year]  DOS: 08/18/2020  Location: Office  MRN: [id number]  Setting: Ambulatory outpatient  Referring Provider: Ronnell Freshwater, NP  Type: Established Patient  Specialty: Interventional Pain Management  PCP: Lavera Guise, MD  Location: Home  Delivery: TeleHealth     Virtual Encounter - Pain Management PROVIDER NOTE: Information contained herein reflects review and annotations entered in association with encounter. Interpretation of such information and data should be left to medically-trained personnel. Information provided to patient can be located elsewhere in the medical record under "Patient Instructions". Document created using STT-dictation technology, any transcriptional errors that may result from process are unintentional.    Contact & Pharmacy Preferred: [phone] Home: [phone] (home) Mobile: [phone] (mobile) E-mail: No e-mail address on record  [id number] [id number]: [phone]: [phone] [place] [place] [address] [address]Phone: [phone]: [phone]  Pre-screening  Candace Clayton offered "in-person" vs "virtual" encounter. She indicated preferring virtual for this encounter.   Reason COVID-19*  Social distancing based on CDC and AMA recommendations.   I contacted PVenancio Poissonon 08/18/2020 via video conference.      I clearly identified myself as BGillis Santa MD. I verified that I was speaking with the correct person using two identifiers (Name: Candace Clayton and date of birth: 2May 03, 1962.  Consent I sought verbal advanced consent from PVenancio Poissonfor virtual visit interactions. I informed Candace Clayton of possible security and privacy concerns, risks, and limitations associated  with providing "not-in-person" medical evaluation and management services. I also informed Candace Clayton of the availability of "in-person" appointments. Finally, I informed her that there would be a charge for the virtual visit and that she could be  personally, fully or partially, financially responsible for it. Candace Clayton understanding and agreed to proceed.   Historic Elements   Ms. PSHANDIE BERTZis a 60y.o. year old, female patient evaluated today after our last contact on 07/21/2020. [name] has a past medical history of Anginal pain ([place], Anxiety, Depression, Diabetes mellitus without complication ([place], GERD (gastroesophageal reflux disease), History of C-section (1992), Hypertension, and Sleep apnea. She also  has a past surgical history that includes Cesarean section (1992); Finger surgery (Left, 2011); Hematoma evacuation (Left, 1999); Colonoscopy; Laparoscopic salpingo oophorectomy (Left, 04/02/2018); and Colonoscopy with propofol (N/A, 03/14/2019). Ms. WEastlickhas a current medication list which includes the following prescription(s): albuterol, buspirone, cetirizine, desvenlafaxine, enalapril, ergocalciferol, fluticasone, contour next test, hydrochlorothiazide, hydroxyzine, metformin, metoprolol tartrate, microlet lancets, multiple vitamins-minerals, NON FORMULARY, OVER THE COUNTER MEDICATION, oxycodone-acetaminophen, [START ON 09/05/2020] oxycodone-acetaminophen, prednisone, pregabalin, rosuvastatin, and anoro ellipta. She  reports that she has never smoked. She has never used smokeless tobacco. She reports that she does not drink alcohol and does not use drugs. Ms. WFleekis allergic to gabapentin.   HPI  Today, she is being contacted for a post-procedure assessment.    Post-Procedure Evaluation  Procedure (07/21/2020):  Type: Lumbar Facet, Medial Branch Block(s) Primary Purpose: Therapeutic Region: Posterolateral Lumbosacral Spine Level:  L3, L4, L5,  Medial Branch  Level(s). Injecting these levels blocks the L3-4, L4-5, and L5-S1 lumbar facet joints. Laterality: Bilateral    (s/p R L3,4,5,S1 06/2018, 05/05/2019 LEFT L3,4,5 RFA done 12/2018, 05/03/20)  Sedation:  Please see nurses note.  Effectiveness during initial hour after procedure(Ultra-Short Term Relief): 100 %   Local anesthetic used: Long-acting (4-6 hours) Effectiveness: Defined as any analgesic benefit obtained secondary to the administration of local anesthetics. This carries significant diagnostic value as to the etiological location, or anatomical origin, of the pain. Duration of benefit is expected to coincide with the duration of the local anesthetic used.  Effectiveness during initial 4-6 hours after procedure(Short-Term Relief): 100 %   Long-term benefit: Defined as any relief past the pharmacologic duration of the local anesthetics.  Effectiveness past the initial 6 hours after procedure(Long-Term Relief): 100 % (pain relief lasted for approx 2 weeks and then gradually returned to the same severity.)   Current benefits: Defined as benefit that persist at this time.   Analgesia:  <50% better Function: Somewhat improved ROM: Back to baseline   Pharmacotherapy Assessment  Analgesic: Percocet 5 mg 3 times daily as needed, quantity 90/month; MME equals 22.5    Monitoring: [place] PMP: PDMP reviewed during this encounter.       Pharmacotherapy: No side-effects or adverse reactions reported. Compliance: No problems identified. Effectiveness: Clinically acceptable. Plan: Refer to "POC".  UDS:  Summary  Date Value Ref Range Status  08/26/2019 Note  Final    Comment:    ==================================================================== Tox[employer] Select 13 (MW) ==================================================================== Test                             Result       Flag       Units Drug Present and Declared for Prescription Verification   Oxycodone                      139           EXPECTED   ng/mg creat   Oxymorphone                    688          EXPECTED   ng/mg creat   Noroxycodone                   473          EXPECTED   ng/mg creat   Noroxymorphone                 163          EXPECTED   ng/mg creat    Sources of oxycodone are scheduled prescription medications.    Oxymorphone, noroxycodone, and noroxymorphone are expected    metabolites of oxycodone. Oxymorphone is also available as a    scheduled prescription medication. ==================================================================== Test                      Result    Flag   Units      Ref Range   Creatinine              64               mg/dL      >=20 ==================================================================== Declared Medications:  The flagging and interpretation on this report are based on the  following declared medications.  Unexpected results may arise from  inaccuracies in the declared medications.  **Note: The testing scope of this panel includes these medications:  Oxycodone  **Note: The testing scope of this panel does not include the  following reported medications:  Acetaminophen  Albuterol  Budesonide (Symbicort)  Buspirone  Cetirizine  Desvenlafaxine (Pristiq)  Docusate (Stool Softener)  Enalapril  Formoterol (Symbicort)  Hydrochlorothiazide  Hydroxyzine  Multivitamin  Pregabalin  Sulfamethoxazole (Bactrim)  Trimethoprim (Bactrim) ==================================================================== For clinical consultation, please call [phone]. ====================================================================     Laboratory Chemistry Profile   Renal Lab Results  Component Value Date   BUN 16 02/23/2020   CREATININE 0.72 02/23/2020   BCR 22 02/23/2020   GFRAA 106 02/23/2020   GFRNONAA 92 02/23/2020     Hepatic Lab Results  Component Value Date   AST 14 02/23/2020   ALT 24 02/23/2020   ALBUMIN 4.2 02/23/2020   ALKPHOS 103 02/23/2020      Electrolytes Lab Results  Component Value Date   NA 141 02/23/2020   K 5.0 02/23/2020   CL 103 02/23/2020   CALCIUM 9.9 02/23/2020     Bone Lab Results  Component Value Date   VD25OH 12.6 (L) 02/23/2020     Inflammation (CRP: Acute Phase) (ESR: Chronic Phase) No results found for: CRP, ESRSEDRATE, LATICACIDVEN     Note: Above Lab results reviewed.   Assessment  The primary encounter diagnosis was Lumbar facet arthropathy (R>L). Diagnoses of Lumbar spondylosis, Lumbar degenerative disc disease, and Chronic pain syndrome were also pertinent to this visit.  Plan of Care   [name] has a current medication list which includes the following long-term medication(s): albuterol, cetirizine, desvenlafaxine, enalapril, fluticasone, hydrochlorothiazide, metformin, metoprolol tartrate, oxycodone-acetaminophen, [START ON 09/05/2020] oxycodone-acetaminophen, pregabalin, and rosuvastatin.  Increased low back pain related to lumbar facet arthropathy.  Bilateral L3, L4, L5 lumbar facet medial branch nerve blocks performed on 07/21/2020 provided approximately 2-2-weeks of significant pain relief and now patient states her pain is back to levels prior to her injection.  She is having difficulty working.  I recommend she increase her Lyrica as below 200 mg in the morning and to 200 mg at night.  Also recommend steroid taper as below.  Pharmacotherapy (Medications Ordered): Meds ordered this encounter  Medications  . pregabalin (LYRICA) 100 MG capsule    Sig: 100 mg qAM, 100-200 mg qhs    Dispense:  90 capsule    Refill:  0    Fill one day early if pharmacy is closed on scheduled refill date. May substitute for generic if available.  . predniSONE (DELTASONE) 20 MG tablet    Sig: Take 3 tablets (60 mg total) by mouth daily with breakfast for 3 days, THEN 2 tablets (40 mg total) daily with breakfast for 3 days, THEN 1 tablet (20 mg total) daily with breakfast for 3 days.    Dispense:   18 tablet    Refill:  0   Follow-up plan:   Return for Keep [facility]. appt.     Status post diagnostic bilateral L3, L4, L5, S1 facet medial branch nerve blocks, status post right L3, L4, L5, S1 RFA on 05/05/2019, 04/15/20; left L3, L4, L5 RFA 05/03/2020.      Recent Visits Date Type Provider Dept  07/21/20 Procedure visit Gillis Santa, MD [facility]  07/07/20 Office Visit Gillis Santa, MD [facility]  Showing recent visits within past 90 days and meeting all other requirements Today's Visits Date Type Provider Dept  08/18/20 Telemedicine Gillis Santa, MD [facility]  Showing today's visits and meeting all other requirements Future Appointments Date Type Provider Dept  10/19/20 Appointment Gillis Santa, MD [facility]  Showing future appointments within next 90 days  and meeting all other requirements  I discussed the assessment and treatment plan with the patient. The patient was provided an opportunity to ask questions and all were answered. The patient agreed with the plan and demonstrated an understanding of the instructions.  Patient advised to call back or seek an in-person evaluation if the symptoms or condition worsens.  Duration of encounter: 38mnutes.  Note by: BGillis Santa MD Date: 08/18/2020; Time: 2:08 PM

## 2020-08-19 NOTE — Progress Notes (Signed)
Negative mammogram

## 2020-08-24 ENCOUNTER — Other Ambulatory Visit: Payer: Self-pay

## 2020-08-24 ENCOUNTER — Telehealth: Payer: Self-pay

## 2020-08-24 ENCOUNTER — Ambulatory Visit: Payer: No Typology Code available for payment source | Admitting: Hospice and Palliative Medicine

## 2020-08-24 ENCOUNTER — Encounter: Payer: Self-pay | Admitting: Hospice and Palliative Medicine

## 2020-08-24 VITALS — BP 142/84 | HR 68 | Temp 97.6°F | Resp 16 | Ht 69.0 in | Wt 345.0 lb

## 2020-08-24 DIAGNOSIS — I10 Essential (primary) hypertension: Secondary | ICD-10-CM

## 2020-08-24 DIAGNOSIS — Z9989 Dependence on other enabling machines and devices: Secondary | ICD-10-CM

## 2020-08-24 DIAGNOSIS — E1165 Type 2 diabetes mellitus with hyperglycemia: Secondary | ICD-10-CM | POA: Diagnosis not present

## 2020-08-24 DIAGNOSIS — Z79899 Other long term (current) drug therapy: Secondary | ICD-10-CM | POA: Diagnosis not present

## 2020-08-24 DIAGNOSIS — F331 Major depressive disorder, recurrent, moderate: Secondary | ICD-10-CM | POA: Diagnosis not present

## 2020-08-24 DIAGNOSIS — F411 Generalized anxiety disorder: Secondary | ICD-10-CM

## 2020-08-24 DIAGNOSIS — E782 Mixed hyperlipidemia: Secondary | ICD-10-CM

## 2020-08-24 DIAGNOSIS — G4733 Obstructive sleep apnea (adult) (pediatric): Secondary | ICD-10-CM

## 2020-08-24 LAB — POCT URINE DRUG SCREEN
POC Amphetamine UR: NOT DETECTED
POC BENZODIAZEPINES UR: NOT DETECTED
POC Barbiturate UR: NOT DETECTED
POC Cocaine UR: NOT DETECTED
POC Ecstasy UR: NOT DETECTED
POC Marijuana UR: NOT DETECTED
POC Methadone UR: NOT DETECTED
POC Methamphetamine UR: NOT DETECTED
POC Opiate Ur: NOT DETECTED
POC Oxycodone UR: POSITIVE — AB
POC PHENCYCLIDINE UR: NOT DETECTED
POC TRICYCLICS UR: NOT DETECTED

## 2020-08-24 LAB — POCT GLYCOSYLATED HEMOGLOBIN (HGB A1C): Hemoglobin A1C: 7.9 % — AB (ref 4.0–5.6)

## 2020-08-24 MED ORDER — ENALAPRIL MALEATE 20 MG PO TABS: 20.0000 mg | ORAL_TABLET | Freq: Two times a day (BID) | ORAL | 3 refills | Status: DC

## 2020-08-24 MED ORDER — METFORMIN HCL 500 MG PO TABS
500.0000 mg | ORAL_TABLET | Freq: Two times a day (BID) | ORAL | 1 refills | Status: DC
Start: 2020-08-24 — End: 2021-01-05

## 2020-08-24 MED ORDER — DESVENLAFAXINE SUCCINATE ER 100 MG PO TB24: 100.0000 mg | ORAL_TABLET | Freq: Every day | ORAL | 1 refills | Status: DC

## 2020-08-24 MED ORDER — BUSPIRONE HCL 15 MG PO TABS: 15.0000 mg | ORAL_TABLET | Freq: Three times a day (TID) | ORAL | 3 refills | Status: DC

## 2020-08-24 MED ORDER — HYDROCHLOROTHIAZIDE 25 MG PO TABS: 25.0000 mg | ORAL_TABLET | Freq: Every day | ORAL | 3 refills | Status: DC

## 2020-08-24 MED ORDER — ROSUVASTATIN CALCIUM 5 MG PO TABS: 5.0000 mg | ORAL_TABLET | Freq: Every day | ORAL | 1 refills | Status: DC

## 2020-08-24 MED ORDER — SITAGLIPTIN PHOSPHATE 25 MG PO TABS: 25.0000 mg | ORAL_TABLET | Freq: Every day | ORAL | 1 refills | Status: DC

## 2020-08-24 MED ORDER — METOPROLOL TARTRATE 25 MG PO TABS
50.0000 mg | ORAL_TABLET | Freq: Two times a day (BID) | ORAL | 1 refills | Status: DC
Start: 2020-08-24 — End: 2022-04-18

## 2020-08-24 NOTE — Telephone Encounter (Signed)
Patient advised to locate or call and get another copy of [place] insurance card mailed and bring to next appointment. beth

## 2020-08-24 NOTE — Progress Notes (Signed)
[facility] [place], [place] [address]  Internal MEDICINE  Office Visit Note  Patient Name: Candace Clayton  [id number]  [id number]  Date of Service: 08/25/2020  Chief Complaint  Patient presents with  . Depression  . Diabetes  . Gastroesophageal Reflux  . Hypertension  . Quality Metric Gaps    Flu, covid  . Back Pain    HPI Patient is here for routine follow-up Continues to have ongoing chronic back pain, worsening pain, followed by pain management, has had recent spinal injections but has not provided her much relief, on chronic narcotic pain medication Was also recently given extended prednisone taper by pain management Unfortunately her glucose levels have been uncontrolled since being on prednisone, averaging 250's Followed by urology for overactive bladder and incontinence, has failed multiple medical therapies, considering implantable stimulator for bladder control Followed by pulmonary for OSA on CPAP--in need of a new mask as her current mask she is wearing is cracked, she has put tape on mask to hold in place but has noticed increased air leak  Recently had mammogram in February--normal findings, recommend repeating in 1 year  BP elevated today--occasionally monitors at home, readings have been stable, feels her BP is elevated today as her back pain is out of control today and ambulating triggers further pain response  Current Medication: Outpatient Encounter Medications as of 08/24/2020  Medication Sig  . albuterol (VENTOLIN HFA) 108 (90 Base) MCG/ACT inhaler TAKE 2 PUFFS BY MOUTH EVERY 6 HOURS AS NEEDED FOR WHEEZE OR SHORTNESS OF BREATH  . cetirizine (ZYRTEC) 10 MG tablet TAKE 1 TABLET(S) BY MOUTH DAILY FOR ALLERGIES  . ergocalciferol (DRISDOL) 1.25 MG (50000 UT) capsule Take 1 capsule (50,000 Units total) by mouth once a week.  . fluticasone (FLONASE) 50 MCG/ACT nasal spray Place 2 sprays into both nostrils daily.  Marland Kitchen glucose blood (CONTOUR  NEXT TEST) test strip 1 each by Other route daily. DX E11.65  . hydrOXYzine (ATARAX/VISTARIL) 10 MG tablet Take 1 tablet (10 mg total) by mouth 3 (three) times daily as needed.  . Microlet Lancets MISC 1 each by Does not apply route daily. DX-E11.65  . Multiple Vitamins-Calcium (ONE-A-DAY WOMENS PO) Take 1 tablet by mouth daily.  . NON FORMULARY cpap device  . OVER THE COUNTER MEDICATION 1 tablet daily as needed. Takes generic stool softener for constipation d/t oxycodone.   Marland Kitchen oxyCODONE-acetaminophen (PERCOCET/ROXICET) 5-325 MG tablet Take 1 tablet by mouth every 8 (eight) hours as needed for severe pain.  Derrill Memo ON 09/05/2020] oxyCODONE-acetaminophen (PERCOCET/ROXICET) 5-325 MG tablet Take 1 tablet by mouth every 8 (eight) hours as needed for severe pain.  . predniSONE (DELTASONE) 20 MG tablet Take 3 tablets (60 mg total) by mouth daily with breakfast for 3 days, THEN 2 tablets (40 mg total) daily with breakfast for 3 days, THEN 1 tablet (20 mg total) daily with breakfast for 3 days.  . pregabalin (LYRICA) 100 MG capsule 100 mg qAM, 100-200 mg qhs  . sitaGLIPtin (JANUVIA) 25 MG tablet Take 1 tablet (25 mg total) by mouth daily.  . [DISCONTINUED] busPIRone (BUSPAR) 15 MG tablet Take 1 tablet (15 mg total) by mouth 3 (three) times daily. Take one tab po bid for one week, then increase to tid for anxiety  . [DISCONTINUED] desvenlafaxine (PRISTIQ) 100 MG 24 hr tablet Take 1 tablet (100 mg total) by mouth daily.  . [DISCONTINUED] enalapril (VASOTEC) 20 MG tablet Take 1 tablet (20 mg total) by mouth 2 (two) times  daily.  . [DISCONTINUED] hydrochlorothiazide (HYDRODIURIL) 25 MG tablet Take 1 tablet (25 mg total) by mouth daily.  . [DISCONTINUED] metFORMIN (GLUCOPHAGE) 500 MG tablet Take 1 tablet (500 mg total) by mouth 2 (two) times daily with a meal.  . [DISCONTINUED] metoprolol tartrate (LOPRESSOR) 25 MG tablet Take 2 tablets (50 mg total) by mouth 2 (two) times daily.  . [DISCONTINUED] rosuvastatin  (CRESTOR) 5 MG tablet Take 1 tablet (5 mg total) by mouth daily.  . [DISCONTINUED] umeclidinium-vilanterol (ANORO ELLIPTA) 62.5-25 MCG/INH AEPB Inhale 1 puff into the lungs daily.  . busPIRone (BUSPAR) 15 MG tablet Take 1 tablet (15 mg total) by mouth 3 (three) times daily. Take on tablet TID for anxiety.  Marland Kitchen desvenlafaxine (PRISTIQ) 100 MG 24 hr tablet Take 1 tablet (100 mg total) by mouth daily.  . enalapril (VASOTEC) 20 MG tablet Take 1 tablet (20 mg total) by mouth 2 (two) times daily.  . hydrochlorothiazide (HYDRODIURIL) 25 MG tablet Take 1 tablet (25 mg total) by mouth daily.  . metFORMIN (GLUCOPHAGE) 500 MG tablet Take 1 tablet (500 mg total) by mouth 2 (two) times daily with a meal.  . metoprolol tartrate (LOPRESSOR) 25 MG tablet Take 2 tablets (50 mg total) by mouth 2 (two) times daily.  . rosuvastatin (CRESTOR) 5 MG tablet Take 1 tablet (5 mg total) by mouth daily.  . [DISCONTINUED] glucose blood (CONTOUR NEXT TEST) test strip 1 each by Other route daily. DX E11.65  . [DISCONTINUED] oxyCODONE-acetaminophen (PERCOCET/ROXICET) 5-325 MG tablet Take 1 tablet by mouth every 8 (eight) hours as needed for severe pain.  . [DISCONTINUED] pregabalin (LYRICA) 50 MG capsule Take 2 capsules (100 mg total) by mouth 2 (two) times daily.  . [DISCONTINUED] sulfamethoxazole-trimethoprim (BACTRIM DS) 800-160 MG tablet Take 1 tablet by mouth 2 (two) times daily.   No facility-administered encounter medications on file as of 08/24/2020.    Surgical History: Past Surgical History:  Procedure Laterality Date  . CESAREAN SECTION  1992  . COLONOSCOPY     polyps removed first procedure. 2nd time all was clear  . COLONOSCOPY WITH PROPOFOL N/A 03/14/2019   Procedure: COLONOSCOPY WITH PROPOFOL;  Surgeon: Lin Landsman, MD;  Location: [facility] ENDOSCOPY;  Service: Gastroenterology;  Laterality: N/A;  . FINGER SURGERY Left 2011   left finger cut off x 2.(only up to last digit)  . HEMATOMA EVACUATION Left 1999    upper part of foot was injured d/t 500lb weight landing on her foot.   Marland Kitchen LAPAROSCOPIC SALPINGO OOPHERECTOMY Left 04/02/2018   Procedure: LAPAROSCOPIC SALPINGO OOPHORECTOMY;  Surgeon: Malachy Mood, MD;  Location: [facility] ORS;  Service: Gynecology;  Laterality: Left;    Medical History: Past Medical History:  Diagnosis Date  . Anginal pain (HCC)    tightness related to anxiety  . Anxiety   . Depression   . Diabetes mellitus without complication ([place])   . GERD (gastroesophageal reflux disease)    throws up easily but not diagnosed with reflux  . History of C-section 24  . Hypertension   . Sleep apnea    uses cpap    Family History: Family History  Problem Relation Age of Onset  . Breast cancer Mother 40       x 3 times  . Colon cancer Mother   . Brain cancer Mother   . Cancer - Colon Mother   . Cancer - Other Mother   . Heart Problems Father   . Colon cancer Brother   . Heart Problems Brother  Social History   Socioeconomic History  . Marital status: Divorced    Spouse name: Not on file  . Number of children: 1  . Years of education: Not on file  . Highest education level: Not on file  Occupational History  . Occupation: works in Secretary/administrator  Tobacco Use  . Smoking status: Never Smoker  . Smokeless tobacco: Never Used  Vaping Use  . Vaping Use: Never used  Substance and Sexual Activity  . Alcohol use: No  . Drug use: No  . Sexual activity: Not Currently    Birth control/protection: Post-menopausal  Other Topics Concern  . Not on file  Social History Narrative   Son has schizophrenia and autism but is fully capable of helping mother after surgery   Social Determinants of Health   Financial Resource Strain: Not on file  Food Insecurity: Not on file  Transportation Needs: Not on file  Physical Activity: Not on file  Stress: Not on file  Social Connections: Not on file  Intimate Partner Violence: Not on file    Review of Systems   Constitutional: Negative for chills, diaphoresis and fatigue.  HENT: Negative for ear pain, postnasal drip and sinus pressure.   Eyes: Negative for photophobia, discharge, redness, itching and visual disturbance.  Respiratory: Negative for cough, shortness of breath and wheezing.   Cardiovascular: Negative for chest pain, palpitations and leg swelling.  Gastrointestinal: Negative for abdominal pain, constipation, diarrhea, nausea and vomiting.  Genitourinary: Negative for dysuria and flank pain.  Musculoskeletal: Positive for arthralgias, back pain and gait problem. Negative for neck pain.  Skin: Negative for color change.  Allergic/Immunologic: Negative for environmental allergies and food allergies.  Neurological: Negative for dizziness and headaches.  Hematological: Does not bruise/bleed easily.  Psychiatric/Behavioral: Negative for agitation, behavioral problems (depression) and hallucinations.    Vital Signs: BP (!) 142/84   Pulse 68   Temp 97.6 F (36.4 C)   Resp 16   Ht 5\' 9"  (1.753 m)   Wt (!) 345 lb (156.5 kg)   SpO2 98%   BMI 50.95 kg/m    Physical Exam Vitals reviewed.  Constitutional:      Appearance: Normal appearance. She is obese.  Cardiovascular:     Rate and Rhythm: Normal rate and regular rhythm.     Pulses: Normal pulses.     Heart sounds: Normal heart sounds.  Pulmonary:     Effort: Pulmonary effort is normal.     Breath sounds: Normal breath sounds.  Abdominal:     General: Abdomen is flat.     Palpations: Abdomen is soft.  Musculoskeletal:        General: Normal range of motion.     Cervical back: Normal range of motion.  Skin:    General: Skin is warm.  Neurological:     General: No focal deficit present.     Mental Status: She is alert and oriented to person, place, and time. Mental status is at baseline.  Psychiatric:        Mood and Affect: Mood normal.        Behavior: Behavior normal.        Thought Content: Thought content normal.         Judgment: Judgment normal.    Assessment/Plan: 1. Type 2 diabetes mellitus with hyperglycemia, without long-term current use of insulin (HCC) A1C 7.9--uncontrolled Add Januvia and continue with Metformin Declines injection therapy Likely acutely elevated due to prednisone use--encouraged to avoid steroid use - POCT  HgB A1C - sitaGLIPtin (JANUVIA) 25 MG tablet; Take 1 tablet (25 mg total) by mouth daily.  Dispense: 90 tablet; Refill: 1 - metFORMIN (GLUCOPHAGE) 500 MG tablet; Take 1 tablet (500 mg total) by mouth 2 (two) times daily with a meal.  Dispense: 180 tablet; Refill: 1  2. GAD (generalized anxiety disorder) Symptoms well controlled, requesting refills - busPIRone (BUSPAR) 15 MG tablet; Take 1 tablet (15 mg total) by mouth 3 (three) times daily. Take on tablet TID for anxiety.  Dispense: 90 tablet; Refill: 3  3. Major depressive disorder, recurrent episode, moderate (HCC) Symptoms well controlled, requesting refills - desvenlafaxine (PRISTIQ) 100 MG 24 hr tablet; Take 1 tablet (100 mg total) by mouth daily.  Dispense: 90 tablet; Refill: 1  4. Essential hypertension BP slightly elevated today--in obvious discomfort from chronic back pain At previous visits BP has been well controlled Monitor closely and will adjust therapy with further elevated readings - enalapril (VASOTEC) 20 MG tablet; Take 1 tablet (20 mg total) by mouth 2 (two) times daily.  Dispense: 180 tablet; Refill: 3 - hydrochlorothiazide (HYDRODIURIL) 25 MG tablet; Take 1 tablet (25 mg total) by mouth daily.  Dispense: 90 tablet; Refill: 3 - metoprolol tartrate (LOPRESSOR) 25 MG tablet; Take 2 tablets (50 mg total) by mouth 2 (two) times daily.  Dispense: 120 tablet; Refill: 1  5. Mixed hyperlipidemia Continue with statin Will need further vascular studies - rosuvastatin (CRESTOR) 5 MG tablet; Take 1 tablet (5 mg total) by mouth daily.  Dispense: 90 tablet; Refill: 1  6. Obesity, morbid ([place]) BMI 50 Obesity  Counseling: Risk Assessment: An assessment of behavioral risk factors was made today and includes lack of exercise sedentary lifestyle, lack of portion control and poor dietary habits.  Risk Modification Advice: She was counseled on portion control guidelines. Restricting daily caloric intake to 1800. The detrimental long term effects of obesity on her health and ongoing poor compliance was also discussed with the patient.  7. OSA on CPAP New mask ordered--current mask has a crack and increased leak - For home use only DME continuous positive airway pressure (CPAP)  8. Encounter for long-term (current) use of high-risk medication - POCT Urine Drug Screen  General Counseling: Kelyse verbalizes understanding of the findings of todays visit and agrees with plan of treatment. I have discussed any further diagnostic evaluation that may be needed or ordered today. We also reviewed her medications today. she has been encouraged to call the office with any questions or concerns that should arise related to todays visit.    Orders Placed This Encounter  Procedures  . For home use only DME continuous positive airway pressure (CPAP)  . POCT HgB A1C  . POCT Urine Drug Screen    Meds ordered this encounter  Medications  . sitaGLIPtin (JANUVIA) 25 MG tablet    Sig: Take 1 tablet (25 mg total) by mouth daily.    Dispense:  90 tablet    Refill:  1  . busPIRone (BUSPAR) 15 MG tablet    Sig: Take 1 tablet (15 mg total) by mouth 3 (three) times daily. Take on tablet TID for anxiety.    Dispense:  90 tablet    Refill:  3  . desvenlafaxine (PRISTIQ) 100 MG 24 hr tablet    Sig: Take 1 tablet (100 mg total) by mouth daily.    Dispense:  90 tablet    Refill:  1  . enalapril (VASOTEC) 20 MG tablet    Sig: Take 1 tablet (  20 mg total) by mouth 2 (two) times daily.    Dispense:  180 tablet    Refill:  3    Please note, patient is taking twice daily. I have updated the number of tablets to reflect this  change. Thanks.  . hydrochlorothiazide (HYDRODIURIL) 25 MG tablet    Sig: Take 1 tablet (25 mg total) by mouth daily.    Dispense:  90 tablet    Refill:  3  . metFORMIN (GLUCOPHAGE) 500 MG tablet    Sig: Take 1 tablet (500 mg total) by mouth 2 (two) times daily with a meal.    Dispense:  180 tablet    Refill:  1  . metoprolol tartrate (LOPRESSOR) 25 MG tablet    Sig: Take 2 tablets (50 mg total) by mouth 2 (two) times daily.    Dispense:  120 tablet    Refill:  1    Please note increased dose. Patient will use what she has before needing new prescription.  . rosuvastatin (CRESTOR) 5 MG tablet    Sig: Take 1 tablet (5 mg total) by mouth daily.    Dispense:  90 tablet    Refill:  1    Time spent: 30 Minutes Time spent includes review of chart, medications, test results and follow-up plan with the patient.  This patient was seen by Theodoro Grist AGNP-C in Collaboration with Dr Lavera Guise as a part of collaborative care agreement     Tanna Furry. Taye Cato AGNP-C Internal medicine

## 2020-08-25 ENCOUNTER — Encounter: Payer: Self-pay | Admitting: Hospice and Palliative Medicine

## 2020-09-01 ENCOUNTER — Ambulatory Visit: Payer: No Typology Code available for payment source

## 2020-09-01 ENCOUNTER — Telehealth: Payer: Self-pay | Admitting: *Deleted

## 2020-09-01 DIAGNOSIS — G4733 Obstructive sleep apnea (adult) (pediatric): Secondary | ICD-10-CM

## 2020-09-01 NOTE — Telephone Encounter (Signed)
Called patient about bcbs. Was on hold with them for 2 hours.  Patient does need prior auth

## 2020-09-01 NOTE — Progress Notes (Signed)
She needed new mask it is n old one but I have some at office will send to her, gave her 2 filters. She will follow up in 6 months

## 2020-09-15 ENCOUNTER — Telehealth: Payer: Self-pay | Admitting: Urology

## 2020-09-15 DIAGNOSIS — F331 Major depressive disorder, recurrent, moderate: Secondary | ICD-10-CM

## 2020-09-15 NOTE — Telephone Encounter (Signed)
It looks like her PTNS was approved by her insurance, but I do not see where she has been scheduled for the treatments.  Would you call her and get her scheduled for her 12 weekly PTNS treatments?

## 2020-09-16 NOTE — Telephone Encounter (Signed)
Candace Clayton can you get her scheduled?

## 2020-09-16 NOTE — Telephone Encounter (Signed)
Attempted to contact patient, VM is full.

## 2020-09-29 ENCOUNTER — Ambulatory Visit: Payer: No Typology Code available for payment source | Admitting: Internal Medicine

## 2020-09-29 ENCOUNTER — Other Ambulatory Visit: Payer: Self-pay

## 2020-09-29 DIAGNOSIS — R0602 Shortness of breath: Secondary | ICD-10-CM | POA: Diagnosis not present

## 2020-09-29 LAB — PULMONARY FUNCTION TEST

## 2020-09-29 NOTE — Progress Notes (Signed)
Appointment canceled.

## 2020-09-30 ENCOUNTER — Ambulatory Visit
Payer: No Typology Code available for payment source | Attending: Student in an Organized Health Care Education/Training Program | Admitting: Student in an Organized Health Care Education/Training Program

## 2020-09-30 ENCOUNTER — Encounter: Payer: Self-pay | Admitting: Student in an Organized Health Care Education/Training Program

## 2020-09-30 VITALS — BP 127/60 | HR 62 | Temp 97.2°F | Resp 16 | Ht 69.0 in | Wt 320.0 lb

## 2020-09-30 DIAGNOSIS — G894 Chronic pain syndrome: Secondary | ICD-10-CM

## 2020-09-30 DIAGNOSIS — M47816 Spondylosis without myelopathy or radiculopathy, lumbar region: Secondary | ICD-10-CM | POA: Diagnosis not present

## 2020-09-30 DIAGNOSIS — M5136 Other intervertebral disc degeneration, lumbar region: Secondary | ICD-10-CM | POA: Insufficient documentation

## 2020-09-30 MED ORDER — OXYCODONE-ACETAMINOPHEN 5-325 MG PO TABS
1.0000 | ORAL_TABLET | Freq: Three times a day (TID) | ORAL | 0 refills | Status: DC | PRN
Start: 2020-12-04 — End: 2020-10-06

## 2020-09-30 MED ORDER — OXYCODONE-ACETAMINOPHEN 5-325 MG PO TABS: 1.0000 | ORAL_TABLET | Freq: Three times a day (TID) | ORAL | 0 refills | Status: DC | PRN

## 2020-09-30 MED ORDER — PREGABALIN 100 MG PO CAPS: ORAL_CAPSULE | ORAL | 5 refills | Status: DC

## 2020-09-30 NOTE — Progress Notes (Signed)
Nursing Pain Medication Assessment:  Safety precautions to be maintained throughout the outpatient stay will include: orient to surroundings, keep bed in low position, maintain call bell within reach at all times, provide assistance with transfer out of bed and ambulation.  Medication Inspection Compliance: Ms. Resler did not comply with our request to bring her pills to be counted. She was reminded that bringing the medication bottles, even when empty, is a requirement.  Medication: None brought in. Pill/Patch Count: None available to be counted. Bottle Appearance: No container available. Did not bring bottle(s) to appointment. Filled Date: N/A Last Medication intake:  Day before yesterday

## 2020-09-30 NOTE — Patient Instructions (Signed)
Pain Management Discharge Instructions  General Discharge Instructions :  If you need to reach your doctor call: Monday-Friday 8:00 am - 4:00 pm at [phone] or toll free [phone].  After clinic hours [phone] to have operator reach doctor.  Bring all of your medication bottles to all your appointments in the pain clinic.  To cancel or reschedule your appointment with Pain Management please remember to call 24 hours in advance to avoid a fee.  Refer to the educational materials which you have been given on: General Risks, I had my Procedure. Discharge Instructions, Post Sedation.  Post Procedure Instructions:  The drugs you were given will stay in your system until tomorrow, so for the next 24 hours you should not drive, make any legal decisions or drink any alcoholic beverages.  You may eat anything you prefer, but it is better to start with liquids then soups and crackers, and gradually work up to solid foods.  Please notify your doctor immediately if you have any unusual bleeding, trouble breathing or pain that is not related to your normal pain.  Depending on the type of procedure that was done, some parts of your body may feel week and/or numb.  This usually clears up by tonight or the next day.  Walk with the use of an assistive device or accompanied by an adult for the 24 hours.  You may use ice on the affected area for the first 24 hours.  Put ice in a Ziploc bag and cover with a towel and place against area 15 minutes on 15 minutes off.  You may switch to heat after 24 hours.Radiofrequency Lesioning Radiofrequency lesioning is a procedure that is performed to relieve pain. The procedure is often used for back, neck, or arm pain. Radiofrequency lesioning involves the use of a machine that creates radio waves to make heat. During the procedure, the heat is applied to the nerve that carries the pain signal. The heat damages the nerve and interferes with the pain signal.  Pain relief usually starts about 2 weeks after the procedure and lasts for 6 months to 1 year. You will be awake during the procedure. You will need to be able to talk with the health care provider during the procedure. Tell a health care provider about:  Any allergies you have.  All medicines you are taking, including vitamins, herbs, eye drops, creams, and over-the-counter medicines.  Any problems you or family members have had with anesthetic medicines.  Any blood disorders you have.  Any surgeries you have had.  Any medical conditions you have or have had.  Whether you are pregnant or may be pregnant. What are the risks? Generally, this is a safe procedure. However, problems may occur, including:  Pain or soreness at the injection site.  Allergic reaction to medicines given during the procedure.  Bleeding.  Infection at the injection site.  Damage to nerves or blood vessels. What happens before the procedure? Staying hydrated Follow instructions from your health care provider about hydration, which may include:  Up to 2 hours before the procedure - you may continue to drink clear liquids, such as water, clear fruit juice, black coffee, and plain tea. Eating and drinking Follow instructions from your health care provider about eating and drinking, which may include:  8 hours before the procedure - stop eating heavy meals or foods, such as meat, fried foods, or fatty foods.  6 hours before the procedure - stop eating light meals or foods, such as toast  or cereal.  6 hours before the procedure - stop drinking milk or drinks that contain milk.  2 hours before the procedure - stop drinking clear liquids. Medicines Ask your health care provider about:  Changing or stopping your regular medicines. This is especially important if you are taking diabetes medicines or blood thinners.  Taking medicines such as aspirin and ibuprofen. These medicines can thin your blood. Do  not take these medicines unless your health care provider tells you to take them.  Taking over-the-counter medicines, vitamins, herbs, and supplements. General instructions  Plan to have someone take you home from the hospital or clinic.  If you will be going home right after the procedure, plan to have someone with you for 24 hours.  Ask your health care provider what steps will be taken to help prevent infection. These may include: ? Removing hair at the procedure site. ? Washing skin with a germ-killing soap. ? Taking antibiotic medicine. What happens during the procedure?  An IV will be inserted into one of your veins.  You will be given one or more of the following: ? A medicine to help you relax (sedative). ? A medicine to numb the area (local anesthetic).  Your health care provider will insert a radiofrequency needle into the area to be treated. This is done with the help of a type of X-ray (fluoroscopy).  A wire that carries the radio waves (electrode) will be put through the radiofrequency needle.  An electrical pulse will be sent through the electrode to verify the correct nerve that is causing your pain. You will feel a tingling sensation, and you may have muscle twitching.  The tissue around the needle tip will be heated by an electric current that comes from the radiofrequency machine. This will numb the nerves.  The needle will be removed.  A bandage (dressing) will be put on the insertion area. The procedure may vary among health care providers and hospitals.   What happens after the procedure?  Your blood pressure, heart rate, breathing rate, and blood oxygen level will be monitored until you leave the hospital or clinic.  Return to your normal activities as told by your health care provider. Ask your health care provider what activities are safe for you.  Do not drive for 24 hours if you were given a sedative during your procedure. Summary  Radiofrequency  lesioning is a procedure that is performed to relieve pain. The procedure is often used for back, neck, or arm pain.  Radiofrequency lesioning involves the use of a machine that creates radio waves to make heat.  Plan to have someone take you home from the hospital or clinic. Do not drive for 24 hours if you were given a sedative during your procedure.  Return to your normal activities as told by your health care provider. Ask your health care provider what activities are safe for you. This information is not intended to replace advice given to you by your health care provider. Make sure you discuss any questions you have with your health care provider. Document Revised: 02/28/2018 Document Reviewed: 02/28/2018 Elsevier Patient Education  [place].

## 2020-09-30 NOTE — Progress Notes (Signed)
PROVIDER NOTE: Information contained herein reflects review and annotations entered in association with encounter. Interpretation of such information and data should be left to medically-trained personnel. Information provided to patient can be located elsewhere in the medical record under "Patient Instructions". Document created using STT-dictation technology, any transcriptional errors that may result from process are unintentional.    Patient: Candace Clayton  Service Category: E/M  Provider: Gillis Santa, MD  DOB: [birth year]  DOS: 09/30/2020  Specialty: Interventional Pain Management  MRN: [id number]  Setting: Ambulatory outpatient  PCP: Lavera Guise, MD  Type: Established Patient    Referring Provider: Ronnell Freshwater, NP  Location: Office  Delivery: Face-to-face     HPI  [name], a 60 y.o. year old female, is here today because of her Lumbar facet arthropathy [M47.816]. [name] primary complain today is Back Pain Last encounter: My last encounter with her was on 07/21/2020. Pertinent problems: [name] has Major depressive disorder, recurrent episode, moderate ([place]); Morbid obesity ([place]); Low back pain with sciatica; OSA on CPAP; Lumbar degenerative disc disease; Lumbar facet arthropathy (R>L); Lumbar spondylosis; Chronic pain syndrome; and Type 2 diabetes mellitus with hyperglycemia, without long-term current use of insulin (HCC) on their pertinent problem list. Pain Assessment: Severity of Chronic pain is reported as a 4 /10. Location: Back Lower/sometimes shoots down right leg to knee. Onset:  . Quality: Constant. Timing:  . Modifying factor(s): meds. Vitals:  height is 5' 9"  (1.753 m) and weight is 320 lb (145.2 kg) (abnormal). Her temporal temperature is 97.2 F (36.2 C) (abnormal). Her blood pressure is 127/60 and her pulse is 62. Her respiration is 16 and oxygen saturation is 100%.   Reason for encounter: medication management.  And discuss worsening right low back pain  related to lumbar facet arthropathy.  Patient presents today for medication management.  She is also complaining of increased lower back pain, right greater than left.  This is related to lumbar facet arthropathy, lumbar spondylosis.  She is status post right L3, L4, L5 RFA on 04/22/2020 and status post left L3, L4, L5 RFA on 05/03/2020.  Lumbar radiofrequency ablation provided her with 90% pain relief for approximately 3 months.  Now the patient is having return of her low back pain that is making it more difficult for her to walk.  She also utilize less as needed pain medication during the time that she was receiving benefit from her lumbar radiofrequency ablation.  Given prior therapeutic benefit and functional benefit with lumbar RFA, we discussed repeating.  Risks and benefits reviewed and patient would like to proceed.  Of note, patient has completed physical therapy in the past with limited response.  She tries to do home stretching exercises including lumbar flexion extension whenever she can.  Pharmacotherapy Assessment   Analgesic: Percocet 5 mg 3 times daily as needed, quantity 90/month; MME equals 22.5    Monitoring: [place] PMP: PDMP reviewed during this encounter.       Pharmacotherapy: No side-effects or adverse reactions reported. Compliance: No problems identified. Effectiveness: Clinically acceptable.  Rise Patience, RN  09/30/2020  1:15 PM  Sign when Signing Visit Nursing Pain Medication Assessment:  Safety precautions to be maintained throughout the outpatient stay will include: orient to surroundings, keep bed in low position, maintain call bell within reach at all times, provide assistance with transfer out of bed and ambulation.  Medication Inspection Compliance: Candace Clayton did not comply with our request to bring her pills to be counted.  She was reminded that bringing the medication bottles, even when empty, is a requirement.  Medication: None brought in. Pill/Patch Count: None  available to be counted. Bottle Appearance: No container available. Did not bring bottle(s) to appointment. Filled Date: N/A Last Medication intake:  Day before yesterday    UDS:  Summary  Date Value Ref Range Status  08/26/2019 Note  Final    Comment:    ==================================================================== [employer] Select 13 (MW) ==================================================================== Test                             Result       Flag       Units Drug Present and Declared for Prescription Verification   Oxycodone                      139          EXPECTED   ng/mg creat   Oxymorphone                    688          EXPECTED   ng/mg creat   Noroxycodone                   473          EXPECTED   ng/mg creat   Noroxymorphone                 163          EXPECTED   ng/mg creat    Sources of oxycodone are scheduled prescription medications.    Oxymorphone, noroxycodone, and noroxymorphone are expected    metabolites of oxycodone. Oxymorphone is also available as a    scheduled prescription medication. ==================================================================== Test                      Result    Flag   Units      Ref Range   Creatinine              64               mg/dL      >=20 ==================================================================== Declared Medications:  The flagging and interpretation on this report are based on the  following declared medications.  Unexpected results may arise from  inaccuracies in the declared medications.  **Note: The testing scope of this panel includes these medications:  Oxycodone  **Note: The testing scope of this panel does not include the  following reported medications:  Acetaminophen  Albuterol  Budesonide (Symbicort)  Buspirone  Cetirizine  Desvenlafaxine (Pristiq)  Docusate (Stool Softener)  Enalapril  Formoterol (Symbicort)  Hydrochlorothiazide  Hydroxyzine  Multivitamin  Pregabalin   Sulfamethoxazole (Bactrim)  Trimethoprim (Bactrim) ==================================================================== For clinical consultation, please call [phone]. ====================================================================      ROS  Constitutional: Denies any fever or chills Gastrointestinal: No reported hemesis, hematochezia, vomiting, or acute GI distress Musculoskeletal: Low back pain, right greater than left Neurological: No reported episodes of acute onset apraxia, aphasia, dysarthria, agnosia, amnesia, paralysis, loss of coordination, or loss of consciousness  Medication Review  Microlet Lancets, Multiple Vitamins-Minerals, NON FORMULARY, OVER THE COUNTER MEDICATION, albuterol, busPIRone, cetirizine, desvenlafaxine, enalapril, ergocalciferol, fluticasone, glucose blood, hydrOXYzine, hydrochlorothiazide, metFORMIN, metoprolol tartrate, oxyCODONE-acetaminophen, pregabalin, rosuvastatin, and sitaGLIPtin  History Review  Allergy: Candace Clayton is allergic to gabapentin. Drug: Candace Clayton  reports no history of drug use.  Alcohol:  reports no history of alcohol use. Tobacco:  reports that she has never smoked. She has never used smokeless tobacco. Social: Candace Clayton  reports that she has never smoked. She has never used smokeless tobacco. She reports that she does not drink alcohol and does not use drugs. Medical:  has a past medical history of Anginal pain ([place]), Anxiety, Depression, Diabetes mellitus without complication ([place]), GERD (gastroesophageal reflux disease), History of C-section (1992), Hypertension, and Sleep apnea. Surgical: Candace Clayton  has a past surgical history that includes Cesarean section (1992); Finger surgery (Left, 2011); Hematoma evacuation (Left, 1999); Colonoscopy; Laparoscopic salpingo oophorectomy (Left, 04/02/2018); and Colonoscopy with propofol (N/A, 03/14/2019). Family: family history includes Brain cancer in her mother; Breast cancer (age of  onset: 79) in her mother; Cancer - Colon in her mother; Cancer - Other in her mother; Colon cancer in her brother and mother; Heart Problems in her brother and father.  Laboratory Chemistry Profile   Renal Lab Results  Component Value Date   BUN 16 02/23/2020   CREATININE 0.72 02/23/2020   BCR 22 02/23/2020   GFRAA 106 02/23/2020   GFRNONAA 92 02/23/2020     Hepatic Lab Results  Component Value Date   AST 14 02/23/2020   ALT 24 02/23/2020   ALBUMIN 4.2 02/23/2020   ALKPHOS 103 02/23/2020     Electrolytes Lab Results  Component Value Date   NA 141 02/23/2020   K 5.0 02/23/2020   CL 103 02/23/2020   CALCIUM 9.9 02/23/2020     Bone Lab Results  Component Value Date   VD25OH 12.6 (L) 02/23/2020     Inflammation (CRP: Acute Phase) (ESR: Chronic Phase) No results found for: CRP, ESRSEDRATE, LATICACIDVEN     Note: Above Lab results reviewed.  Recent Imaging Review  MM 3D SCREEN BREAST BILATERAL CLINICAL DATA:  Screening.  EXAM: DIGITAL SCREENING BILATERAL MAMMOGRAM WITH TOMOSYNTHESIS AND CAD  TECHNIQUE: Bilateral screening digital craniocaudal and mediolateral oblique mammograms were obtained. Bilateral screening digital breast tomosynthesis was performed. The images were evaluated with computer-aided detection.  COMPARISON:  Previous exam(s).  ACR Breast Density Category b: There are scattered areas of fibroglandular density.  FINDINGS: There are no findings suspicious for malignancy.  IMPRESSION: No mammographic evidence of malignancy. A result letter of this screening mammogram will be mailed directly to the patient.  RECOMMENDATION: Screening mammogram in one year. (Code:SM-B-01Y)  BI-RADS CATEGORY  1: Negative.  Electronically Signed   By: Audie Pinto M.D.   On: 08/18/2020 15:10 Note: Reviewed        Physical Exam  General appearance: Well nourished, well developed, and well hydrated. In no apparent acute distress Mental status: Alert,  oriented x 3 (person, place, & time)       Respiratory: No evidence of acute respiratory distress Eyes: PERLA Vitals: BP 127/60   Pulse 62   Temp (!) 97.2 F (36.2 C) (Temporal)   Resp 16   Ht 5' 9"  (1.753 m)   Wt (!) 320 lb (145.2 kg)   SpO2 100%   BMI 47.26 kg/m  BMI: Estimated body mass index is 47.26 kg/m as calculated from the following:   Height as of this encounter: 5' 9"  (1.753 m).   Weight as of this encounter: 320 lb (145.2 kg). Ideal: Ideal body weight: 66.2 kg (145 lb 15.1 oz) Adjusted ideal body weight: 97.8 kg (215 lb 9.1 oz)  Lumbar Spine Area Exam  Skin & Axial Inspection: No masses, redness, or swelling Alignment: Symmetrical  Functional ROM: Limited  Stability: No instability detected Muscle Tone/Strength: Functionally intact. No obvious neuro-muscular anomalies detected. Sensory (Neurological): Improved Palpation: No palpable anomalies       Provocative Tests: Hyperextension/rotation test: (+) bilaterally for facet joint pain.  R>L Lumbar quadrant test (Kemp's test): deferred today       Lateral bending test: (+) due to pain.,    Gait & Posture Assessment  Ambulation: Unassisted Gait: Relatively normal for age and body habitus Posture: WNL   Lower Extremity Exam    Side: Right lower extremity  Side: Left lower extremity  Stability: No instability observed          Stability: No instability observed          Skin & Extremity Inspection: Skin color, temperature, and hair growth are WNL. No peripheral edema or cyanosis. No masses, redness, swelling, asymmetry, or associated skin lesions. No contractures.  Skin & Extremity Inspection: Skin color, temperature, and hair growth are WNL. No peripheral edema or cyanosis. No masses, redness, swelling, asymmetry, or associated skin lesions. No contractures.  Functional ROM: Unrestricted ROM                  Functional ROM: Unrestricted ROM                  Muscle Tone/Strength: Functionally intact. No  obvious neuro-muscular anomalies detected.  Muscle Tone/Strength: Functionally intact. No obvious neuro-muscular anomalies detected.  Sensory (Neurological): Unimpaired        Sensory (Neurological): Unimpaired        DTR: Patellar: deferred today Achilles: deferred today Plantar: deferred today  DTR: Patellar: deferred today Achilles: deferred today Plantar: deferred today  Palpation: No palpable anomalies  Palpation: No palpable anomalies   Assessment   Status Diagnosis  Worsening Worsening Worsening 1. Lumbar facet arthropathy (R>L)   2. Lumbar spondylosis   3. Lumbar degenerative disc disease   4. Chronic pain syndrome       Plan of Care  Candace Clayton has a current medication list which includes the following long-term medication(s): albuterol, cetirizine, desvenlafaxine, enalapril, fluticasone, hydrochlorothiazide, metformin, metoprolol tartrate, rosuvastatin, sitagliptin, [START ON 10/05/2020] oxycodone-acetaminophen, [START ON 11/04/2020] oxycodone-acetaminophen, [START ON 12/04/2020] oxycodone-acetaminophen, and pregabalin.  Pharmacotherapy (Medications Ordered): Meds ordered this encounter  Medications  . oxyCODONE-acetaminophen (PERCOCET/ROXICET) 5-325 MG tablet    Sig: Take 1 tablet by mouth every 8 (eight) hours as needed for severe pain.    Dispense:  90 tablet    Refill:  0    For chronic pain syndrome. To last for 30 days from fill date.  Marland Kitchen oxyCODONE-acetaminophen (PERCOCET/ROXICET) 5-325 MG tablet    Sig: Take 1 tablet by mouth every 8 (eight) hours as needed for severe pain.    Dispense:  90 tablet    Refill:  0    For chronic pain syndrome. To last for 30 days from fill date.  Marland Kitchen oxyCODONE-acetaminophen (PERCOCET/ROXICET) 5-325 MG tablet    Sig: Take 1 tablet by mouth every 8 (eight) hours as needed for severe pain.    Dispense:  90 tablet    Refill:  0    For chronic pain syndrome. To last for 30 days from fill date.  . pregabalin (LYRICA) 100 MG  capsule    Sig: 100 mg qAM, 100-200 mg qhs    Dispense:  90 capsule    Refill:  5    Fill one day early if pharmacy is closed on scheduled refill date. May substitute for  generic if available.   Orders:  Orders Placed This Encounter  Procedures  . Radiofrequency,Lumbar    Standing Status:   Future    Standing Expiration Date:   09/30/2021    Scheduling Instructions:     Side(s): RIGHT     Level: L3-4, L4-5,  Facets (L3, L4, L5,  Medial Branch Nerves)     Sedation: Patient's choice.     Scheduling Timeframe: As soon as pre-approved    Order Specific Question:   Where will this procedure be performed?    Answer:   [facility]  . [employer] Select 13 (MW), Urine    Volume: 30 ml(s). Minimum 3 ml of urine is needed. Document temperature of fresh sample. Indications: Long term (current) use of opiate analgesic (P89.842)    Order Specific Question:   Release to patient    Answer:   Immediate   Follow-up plan:   Return in about 3 weeks (around 10/21/2020) for Right L3,4,5 RFA with sedation.     Status post diagnostic bilateral L3, L4, L5, S1 facet medial branch nerve blocks, status post right L3, L4, L5, S1 RFA on 05/05/2019, 04/15/20; left L3, L4, L5 RFA 05/03/2020.       Recent Visits Date Type Provider Dept  08/18/20 Telemedicine Gillis Santa, MD [facility]  07/21/20 Procedure visit Gillis Santa, MD [facility]  07/07/20 Office Visit Gillis Santa, MD [facility]  Showing recent visits within past 90 days and meeting all other requirements Today's Visits Date Type Provider Dept  09/30/20 Office Visit Gillis Santa, MD [facility]  Showing today's visits and meeting all other requirements Future Appointments Date Type Provider Dept  12/28/20 Appointment Gillis Santa, MD [facility]  Showing future appointments within next 90 days and meeting all other requirements  I discussed the assessment and treatment plan with the  patient. The patient was provided an opportunity to ask questions and all were answered. The patient agreed with the plan and demonstrated an understanding of the instructions.  Patient advised to call back or seek an in-person evaluation if the symptoms or condition worsens.  Duration of encounter: 30 minutes.  Note by: Gillis Santa, MD Date: 09/30/2020; Time: 2:19 PM

## 2020-10-04 ENCOUNTER — Telehealth: Payer: Self-pay

## 2020-10-04 NOTE — Telephone Encounter (Signed)
The pharmacy is saying she doesn't have a prescription in their system and she is completely out of medicine.

## 2020-10-04 NOTE — Telephone Encounter (Signed)
Patient notified that she had a prescription that could be filled tomorrow.

## 2020-10-06 ENCOUNTER — Telehealth: Payer: Self-pay | Admitting: Student in an Organized Health Care Education/Training Program

## 2020-10-06 ENCOUNTER — Other Ambulatory Visit: Payer: Self-pay | Admitting: Student in an Organized Health Care Education/Training Program

## 2020-10-06 ENCOUNTER — Telehealth: Payer: Self-pay | Admitting: Pain Medicine

## 2020-10-06 ENCOUNTER — Telehealth: Payer: Self-pay | Admitting: *Deleted

## 2020-10-06 DIAGNOSIS — G894 Chronic pain syndrome: Secondary | ICD-10-CM

## 2020-10-06 MED ORDER — OXYCODONE-ACETAMINOPHEN 5-325 MG PO TABS: 1.0000 | ORAL_TABLET | Freq: Three times a day (TID) | ORAL | 0 refills | Status: DC | PRN

## 2020-10-06 NOTE — Addendum Note (Signed)
Addended by: Gillis Santa on: 10/06/2020 12:25 PM   Modules accepted: Orders

## 2020-10-06 NOTE — Progress Notes (Signed)
Prescriptions that were previously sent to [place] cancelled.

## 2020-10-06 NOTE — Telephone Encounter (Signed)
Patient states the pharmacy says they do not have any scripts for her to fill. Please call [facility] and then let patient know status.

## 2020-10-06 NOTE — Telephone Encounter (Signed)
Voicemail left with patient inquiring what medication and what [facility] was telling her.  Does she need PA or do they not have the Rx.  Asked to please return the call.

## 2020-10-06 NOTE — Telephone Encounter (Signed)
Dr Holley Raring, I spoke with [facility] and they state they did not receive any prescriptions for the Percocet.  You sent 3 and it says that they were confirmed by the pharmacy but yet the pharmacist states they do not have any of them.

## 2020-10-13 NOTE — Procedures (Signed)
[facility] [place], [id number]    Complete Pulmonary Function Testing Interpretation:  FINDINGS:  The forced vital capacity is mildly decreased the FEV1 is 2.24 L which is 77% of predicted and is mildly decreased.  F1 FVC ratio was normal.  Total lung capacity is moderately decreased residual volume is decreased residual internal capacity ratio is decreased FRC was also decreased.  Postbronchodilator there was no significant change in the FEV1 however clinical improvement may occur in the absence of spirometric improvement  IMPRESSION:  This pulmonary function study is consistent with mild to moderate restrictive lung disease clinical correlation strongly recommended.  DLCO was within normal limits  Allyne Gee, MD [facility] Sleep Medicine

## 2020-10-19 ENCOUNTER — Encounter
Payer: No Typology Code available for payment source | Admitting: Student in an Organized Health Care Education/Training Program

## 2020-10-26 HISTORY — PX: CHOLECYSTECTOMY: SHX55

## 2020-10-27 ENCOUNTER — Ambulatory Visit
Payer: No Typology Code available for payment source | Admitting: Student in an Organized Health Care Education/Training Program

## 2020-10-27 DIAGNOSIS — K851 Biliary acute pancreatitis without necrosis or infection: Secondary | ICD-10-CM

## 2020-10-27 DIAGNOSIS — N179 Acute kidney failure, unspecified: Secondary | ICD-10-CM | POA: Insufficient documentation

## 2020-10-27 HISTORY — DX: Biliary acute pancreatitis without necrosis or infection: K85.10

## 2020-10-29 ENCOUNTER — Other Ambulatory Visit: Payer: Self-pay | Admitting: Nurse Practitioner

## 2020-10-29 DIAGNOSIS — E559 Vitamin D deficiency, unspecified: Secondary | ICD-10-CM

## 2020-11-01 ENCOUNTER — Other Ambulatory Visit: Payer: Self-pay

## 2020-11-01 ENCOUNTER — Encounter: Payer: Self-pay | Admitting: Nurse Practitioner

## 2020-11-01 ENCOUNTER — Ambulatory Visit: Payer: No Typology Code available for payment source | Admitting: Nurse Practitioner

## 2020-11-01 DIAGNOSIS — J441 Chronic obstructive pulmonary disease with (acute) exacerbation: Secondary | ICD-10-CM | POA: Diagnosis not present

## 2020-11-01 DIAGNOSIS — R0602 Shortness of breath: Secondary | ICD-10-CM | POA: Diagnosis not present

## 2020-11-01 DIAGNOSIS — Z9049 Acquired absence of other specified parts of digestive tract: Secondary | ICD-10-CM | POA: Diagnosis not present

## 2020-11-01 DIAGNOSIS — I10 Essential (primary) hypertension: Secondary | ICD-10-CM

## 2020-11-01 DIAGNOSIS — R160 Hepatomegaly, not elsewhere classified: Secondary | ICD-10-CM

## 2020-11-01 MED ORDER — METOPROLOL SUCCINATE ER 100 MG PO TB24: 100.0000 mg | ORAL_TABLET | Freq: Every day | ORAL | 1 refills | Status: DC

## 2020-11-01 MED ORDER — IPRATROPIUM-ALBUTEROL 0.5-2.5 (3) MG/3ML IN SOLN
3.0000 mL | Freq: Once | RESPIRATORY_TRACT | Status: AC
  Administered 2020-11-01: 3 mL via RESPIRATORY_TRACT

## 2020-11-01 MED ORDER — AMLODIPINE BESYLATE 5 MG PO TABS: 5.0000 mg | ORAL_TABLET | Freq: Every day | ORAL | 1 refills | Status: DC

## 2020-11-01 MED ORDER — FLUTICASONE-SALMETEROL 100-50 MCG/ACT IN AEPB: 1.0000 | INHALATION_SPRAY | Freq: Two times a day (BID) | RESPIRATORY_TRACT | 3 refills | Status: DC

## 2020-11-01 NOTE — Progress Notes (Signed)
[facility] [place], [place] [address]  Internal MEDICINE  Office Visit Note  Patient Name: Candace Clayton  [id number]  [id number]  Date of Service: 11/02/2020     Chief Complaint  Patient presents with  . Hospitalization Follow-up    Note for work, discuss short term disability   . Diabetes  . Hypertension  . Anxiety  . Quality Metric Gaps    Eye exam     HPI Pt is here for recent hospital follow up.Acute Gallstone Pancreatitis and Cholecystitis Pt was admitted with RUQ abdominal pain, nausea, and subjective fevers (symptoms started the evening of 5/2). Labs significant for leukocytosis and elevated bilirubin to 6.9 with elevated liver enzymes. US abdomen significant for hydropic gallbladder with cholelithiasis, trace pericholecystic fluid, and wall thickening, consistent with acute cholecystitis with proximal bile duct dilation concerning for acute biliary obstruction. Biliary team was consulted and patient underwent ERCP on 5/4 with removal of sludge from the biliary tree. Hospitalized for cholecystectomy,  Coughing up brown sputum, was intubated for procedures twice while hospitalized.  Reports hoarseness, coughing, history of developing pneumonia postop, shortness of breath, dyspnea, having to "work to pull air in".  Needs a note for work, still recovering from surgery  Incidental finding: liver mass US abdomen on 5/3 showed a 1.8 x 1.8 x 1.9 cm hypoechoic lesion in the right hepatic lobe not seen on CT abdomen.  - Recommend outpatient MRI abdomen w/ and w/o contrast to further evaluate   Current Medication: Outpatient Encounter Medications as of 11/01/2020  Medication Sig  . albuterol (VENTOLIN HFA) 108 (90 Base) MCG/ACT inhaler TAKE 2 PUFFS BY MOUTH EVERY 6 HOURS AS NEEDED FOR WHEEZE OR SHORTNESS OF BREATH  . amLODipine (NORVASC) 5 MG tablet Take 1 tablet (5 mg total) by mouth daily.  . busPIRone (BUSPAR) 15 MG tablet Take 1 tablet (15 mg  total) by mouth 3 (three) times daily. Take on tablet TID for anxiety.  . cetirizine (ZYRTEC) 10 MG tablet TAKE 1 TABLET(S) BY MOUTH DAILY FOR ALLERGIES  . desvenlafaxine (PRISTIQ) 100 MG 24 hr tablet Take 1 tablet (100 mg total) by mouth daily.  . enalapril (VASOTEC) 20 MG tablet Take 1 tablet (20 mg total) by mouth 2 (two) times daily.  . ergocalciferol (DRISDOL) 1.25 MG (50000 UT) capsule Take 1 capsule (50,000 Units total) by mouth once a week.  . fluticasone (FLONASE) 50 MCG/ACT nasal spray Place 2 sprays into both nostrils daily.  . fluticasone-salmeterol (ADVAIR) 100-50 MCG/ACT AEPB Inhale 1 puff into the lungs 2 (two) times daily. Rinse mouth after each use  . glucose blood (CONTOUR NEXT TEST) test strip 1 each by Other route daily. DX E11.65  . hydrochlorothiazide (HYDRODIURIL) 25 MG tablet Take 1 tablet (25 mg total) by mouth daily.  . hydrOXYzine (ATARAX/VISTARIL) 10 MG tablet Take 1 tablet (10 mg total) by mouth 3 (three) times daily as needed.  . metFORMIN (GLUCOPHAGE) 500 MG tablet Take 1 tablet (500 mg total) by mouth 2 (two) times daily with a meal.  . metoprolol succinate (TOPROL-XL) 100 MG 24 hr tablet Take 1 tablet (100 mg total) by mouth daily. Take with or immediately following a meal.  . Microlet Lancets MISC 1 each by Does not apply route daily. DX-E11.65  . Multiple Vitamins-Calcium (ONE-A-DAY WOMENS PO) Take 1 tablet by mouth daily.  . NON FORMULARY cpap device  . OVER THE COUNTER MEDICATION 1 tablet daily as needed. Takes generic stool softener for constipation d/t oxycodone.   Marland Kitchen  oxyCODONE-acetaminophen (PERCOCET/ROXICET) 5-325 MG tablet Take 1 tablet by mouth every 8 (eight) hours as needed for severe pain.  Derrill Memo ON 12/04/2020] oxyCODONE-acetaminophen (PERCOCET/ROXICET) 5-325 MG tablet Take 1 tablet by mouth every 8 (eight) hours as needed for severe pain.  Derrill Memo ON 11/04/2020] oxyCODONE-acetaminophen (PERCOCET/ROXICET) 5-325 MG tablet Take 1 tablet by mouth every 8  (eight) hours as needed for severe pain.  . pregabalin (LYRICA) 100 MG capsule 100 mg qAM, 100-200 mg qhs  . rosuvastatin (CRESTOR) 5 MG tablet Take 1 tablet (5 mg total) by mouth daily.  . sitaGLIPtin (JANUVIA) 25 MG tablet Take 1 tablet (25 mg total) by mouth daily.  . [DISCONTINUED] metoprolol tartrate (LOPRESSOR) 25 MG tablet Take 2 tablets (50 mg total) by mouth 2 (two) times daily.  . [EXPIRED] ipratropium-albuterol (DUONEB) 0.5-2.5 (3) MG/3ML nebulizer solution 3 mL    No facility-administered encounter medications on file as of 11/01/2020.    Surgical History: Past Surgical History:  Procedure Laterality Date  . CESAREAN SECTION  1992  . CHOLECYSTECTOMY    . COLONOSCOPY     polyps removed first procedure. 2nd time all was clear  . COLONOSCOPY WITH PROPOFOL N/A 03/14/2019   Procedure: COLONOSCOPY WITH PROPOFOL;  Surgeon: Lin Landsman, MD;  Location: [facility] ENDOSCOPY;  Service: Gastroenterology;  Laterality: N/A;  . FINGER SURGERY Left 2011   left finger cut off x 2.(only up to last digit)  . HEMATOMA EVACUATION Left 1999   upper part of foot was injured d/t 500lb weight landing on her foot.   Marland Kitchen LAPAROSCOPIC SALPINGO OOPHERECTOMY Left 04/02/2018   Procedure: LAPAROSCOPIC SALPINGO OOPHORECTOMY;  Surgeon: Malachy Mood, MD;  Location: [facility] ORS;  Service: Gynecology;  Laterality: Left;    Medical History: Past Medical History:  Diagnosis Date  . Anginal pain (HCC)    tightness related to anxiety  . Anxiety   . Cholecystolithiasis   . Depression   . Diabetes mellitus without complication ([place])   . GERD (gastroesophageal reflux disease)    throws up easily but not diagnosed with reflux  . History of C-section 15  . Hypertension   . Sleep apnea    uses cpap    Family History: Family History  Problem Relation Age of Onset  . Breast cancer Mother 40       x 3 times  . Colon cancer Mother   . Brain cancer Mother   . Cancer - Colon Mother   . Cancer - Other  Mother   . Heart Problems Father   . Colon cancer Brother   . Heart Problems Brother     Social History   Socioeconomic History  . Marital status: Divorced    Spouse name: Not on file  . Number of children: 1  . Years of education: Not on file  . Highest education level: Not on file  Occupational History  . Occupation: works in Secretary/administrator  Tobacco Use  . Smoking status: Never Smoker  . Smokeless tobacco: Never Used  Vaping Use  . Vaping Use: Never used  Substance and Sexual Activity  . Alcohol use: No  . Drug use: No  . Sexual activity: Not Currently    Birth control/protection: Post-menopausal  Other Topics Concern  . Not on file  Social History Narrative   Son has schizophrenia and autism but is fully capable of helping mother after surgery   Social Determinants of Health   Financial Resource Strain: Not on file  Food Insecurity: Not on file  Transportation Needs: Not on file  Physical Activity: Not on file  Stress: Not on file  Social Connections: Not on file  Intimate Partner Violence: Not on file      Review of Systems  Constitutional: Positive for fatigue.  HENT: Positive for sore throat and voice change.   Respiratory: Positive for cough, chest tightness and shortness of breath.   Cardiovascular: Negative.   Gastrointestinal: Negative.   Genitourinary: Negative.   Musculoskeletal: Negative.   Neurological: Negative.     Vital Signs: BP (!) 170/80   Pulse 64   Temp (!) 97.3 F (36.3 C)   Resp 16   Ht 5\' 9"  (1.753 m)   Wt (!) 344 lb (156 kg)   SpO2 97%   BMI 50.80 kg/m    Physical Exam Constitutional:      General: She is in acute distress.     Appearance: She is obese. She is ill-appearing.  HENT:     Head: Normocephalic and atraumatic.  Pulmonary:     Effort: Respiratory distress present.     Comments: Breath sounds are diminished in lower lobes bilaterally, clear in upper lobes bilaterally. Increased work of breathing noted,  use of accessory muscles Abdominal:     General: Bowel sounds are normal.     Tenderness: There is abdominal tenderness.     Comments: 5 small 1-inch abdominal incisions, approximated with topical adhesive. No redness, swelling or purulent drainage noted.   Musculoskeletal:     Cervical back: Normal range of motion and neck supple.  Neurological:     Mental Status: She is alert and oriented to person, place, and time.       Assessment/Plan: 1. Acute exacerbation of chronic obstructive pulmonary disease (COPD) ([place]) PFT results from 09/29/20: The forced vital capacity is mildly decreased the FEV1 is 2.24 L which is 77% of predicted and is mildly decreased.  F1 FVC ratio was normal.  Total lung capacity is moderately decreased residual volume is decreased residual internal capacity ratio is decreased FRC was also decreased.  Postbronchodilator there was no significant change in the FEV1 however clinical improvement may occur in the absence of spirometric improvement  -patient is exhibiting increased WOB, use of accessory muscles, diminished breath sounds in lower lobes bilaterally and feels like she has to work hard to pull air in.  duoneb nebulizer treatment administered in the clinic. Pt reports not knowing if it helped or not but her work of breathing has improved and she is no longer using accessory muscles. Pt started on advair inhaler for COPD management. Will return to clinic in 2 weeks to assess respiratory status.  - ipratropium-albuterol (DUONEB) 0.5-2.5 (3) MG/3ML nebulizer solution 3 mL - fluticasone-salmeterol (ADVAIR) 100-50 MCG/ACT AEPB; Inhale 1 puff into the lungs 2 (two) times daily. Rinse mouth after each use  Dispense: 1 each; Refill: 3  2. Shortness of breath Pt experiencing shortness of breath related to the acute exacerbation of COPD as discuss above in problem #1. Shortness of breath improved with breathing treatment in office. F/U in 2 weeks to determine effectiveness of  advair inhaler. Will review CT chest results with Jalyiah when availavble. - CT Chest Wo Contrast; Future  3. Essential hypertension, benign bp elevated at this visit. Metoprolol tartrate discontinued, metoprolol succinate started to decrease the number of pills and dose frequency. Amlodipine 5 mg added to her medication regimen. F/U in 2 weeks, recheck BP.  - metoprolol succinate (TOPROL-XL) 100 MG 24 hr tablet; Take 1 tablet (  100 mg total) by mouth daily. Take with or immediately following a meal.  Dispense: 30 tablet; Refill: 1 - amLODipine (NORVASC) 5 MG tablet; Take 1 tablet (5 mg total) by mouth daily.  Dispense: 30 tablet; Refill: 1  4. S/P cholecystectomy Pt is slowly recovering from her surgery, will monitor hr recovery   5. Liver mass Incidental liver mass, will need further follow up once feels better   .General Counseling: Mamie verbalizes understanding of the findings of todays visit and agrees with plan of treatment. I have discussed any further diagnostic evaluation that may be needed or ordered today. We also reviewed her medications today. she has been encouraged to call the office with any questions or concerns that should arise related to todays visit.  Counseling:  [place] Controlled Substance Database was reviewed by me.  Orders Placed This Encounter  Procedures  . CT Chest Wo Contrast      I have reviewed all medical records from hospital follow up including radiology reports and consults from other physicians. Appropriate follow up diagnostics will be scheduled as needed. Patient/ Family understands the plan of treatment. Time spent 35 minutes.   Dr Lavera Guise, MD Internal Medicine

## 2020-11-02 ENCOUNTER — Telehealth: Payer: Self-pay

## 2020-11-02 NOTE — Telephone Encounter (Signed)
Lmom advising patient of CT scheduled at the med center in [place] on 11-11-20. Loma Sousa

## 2020-11-11 ENCOUNTER — Other Ambulatory Visit: Payer: Self-pay

## 2020-11-11 ENCOUNTER — Ambulatory Visit
Admission: RE | Admit: 2020-11-11 | Discharge: 2020-11-11 | Disposition: A | Discharge status: Home / Self Care | Payer: No Typology Code available for payment source | Source: Ambulatory Visit | Attending: Internal Medicine | Admitting: Internal Medicine

## 2020-11-11 DIAGNOSIS — R0602 Shortness of breath: Secondary | ICD-10-CM | POA: Diagnosis not present

## 2020-11-12 ENCOUNTER — Other Ambulatory Visit: Payer: Self-pay | Admitting: Student in an Organized Health Care Education/Training Program

## 2020-11-12 ENCOUNTER — Other Ambulatory Visit: Payer: Self-pay | Admitting: Nurse Practitioner

## 2020-11-12 DIAGNOSIS — E559 Vitamin D deficiency, unspecified: Secondary | ICD-10-CM

## 2020-11-15 ENCOUNTER — Ambulatory Visit (HOSPITAL_BASED_OUTPATIENT_CLINIC_OR_DEPARTMENT_OTHER)
Payer: No Typology Code available for payment source | Admitting: Student in an Organized Health Care Education/Training Program

## 2020-11-15 ENCOUNTER — Other Ambulatory Visit: Payer: Self-pay

## 2020-11-15 ENCOUNTER — Ambulatory Visit: Payer: No Typology Code available for payment source | Admitting: Nurse Practitioner

## 2020-11-15 ENCOUNTER — Encounter: Payer: Self-pay | Admitting: Nurse Practitioner

## 2020-11-15 ENCOUNTER — Ambulatory Visit
Admission: RE | Admit: 2020-11-15 | Discharge: 2020-11-15 | Disposition: A | Discharge status: Home / Self Care | Payer: No Typology Code available for payment source | Source: Ambulatory Visit | Attending: Student in an Organized Health Care Education/Training Program | Admitting: Student in an Organized Health Care Education/Training Program

## 2020-11-15 ENCOUNTER — Encounter: Payer: Self-pay | Admitting: Student in an Organized Health Care Education/Training Program

## 2020-11-15 DIAGNOSIS — I2721 Secondary pulmonary arterial hypertension: Secondary | ICD-10-CM

## 2020-11-15 DIAGNOSIS — J449 Chronic obstructive pulmonary disease, unspecified: Secondary | ICD-10-CM | POA: Diagnosis not present

## 2020-11-15 DIAGNOSIS — M47816 Spondylosis without myelopathy or radiculopathy, lumbar region: Secondary | ICD-10-CM | POA: Diagnosis not present

## 2020-11-15 DIAGNOSIS — R160 Hepatomegaly, not elsewhere classified: Secondary | ICD-10-CM

## 2020-11-15 DIAGNOSIS — I10 Essential (primary) hypertension: Secondary | ICD-10-CM

## 2020-11-15 DIAGNOSIS — G894 Chronic pain syndrome: Secondary | ICD-10-CM | POA: Insufficient documentation

## 2020-11-15 DIAGNOSIS — E559 Vitamin D deficiency, unspecified: Secondary | ICD-10-CM

## 2020-11-15 DIAGNOSIS — G4733 Obstructive sleep apnea (adult) (pediatric): Secondary | ICD-10-CM

## 2020-11-15 DIAGNOSIS — F331 Major depressive disorder, recurrent, moderate: Secondary | ICD-10-CM

## 2020-11-15 MED ORDER — LIDOCAINE HCL 2 % IJ SOLN
INTRAMUSCULAR | Status: AC
  Filled 2020-11-15: qty 20

## 2020-11-15 MED ORDER — DEXAMETHASONE SODIUM PHOSPHATE 10 MG/ML IJ SOLN
10.0000 mg | Freq: Once | INTRAMUSCULAR | Status: AC
  Administered 2020-11-15: 10 mg

## 2020-11-15 MED ORDER — TRELEGY ELLIPTA 100-62.5-25 MCG/INH IN AEPB: 1.0000 | INHALATION_SPRAY | Freq: Every day | RESPIRATORY_TRACT | 11 refills | Status: DC

## 2020-11-15 MED ORDER — LIDOCAINE HCL 2 % IJ SOLN
20.0000 mL | Freq: Once | INTRAMUSCULAR | Status: AC
  Administered 2020-11-15: 400 mg

## 2020-11-15 MED ORDER — ERGOCALCIFEROL 1.25 MG (50000 UT) PO CAPS: 50000.0000 [IU] | ORAL_CAPSULE | ORAL | 5 refills | Status: DC

## 2020-11-15 MED ORDER — ROPIVACAINE HCL 2 MG/ML IJ SOLN
9.0000 mL | Freq: Once | INTRAMUSCULAR | Status: AC
  Administered 2020-11-15: 9 mL via PERINEURAL

## 2020-11-15 MED ORDER — DEXAMETHASONE SODIUM PHOSPHATE 10 MG/ML IJ SOLN
INTRAMUSCULAR | Status: AC
  Filled 2020-11-15: qty 1

## 2020-11-15 MED ORDER — DESVENLAFAXINE SUCCINATE ER 100 MG PO TB24: 100.0000 mg | ORAL_TABLET | Freq: Every day | ORAL | 1 refills | Status: DC

## 2020-11-15 MED ORDER — ROPIVACAINE HCL 2 MG/ML IJ SOLN
INTRAMUSCULAR | Status: AC
  Filled 2020-11-15: qty 10

## 2020-11-15 NOTE — Progress Notes (Signed)
Patient's Name: Candace Clayton  MRN: [id number]  Referring Provider: Gillis Santa, MD  DOB: [birth year]  PCP: Lavera Guise, MD  DOS: 11/15/2020  Note by: Gillis Santa, MD  Service setting: Ambulatory outpatient  Specialty: Interventional Pain Management  Patient type: Established  Location: [facility] (AMB) Pain Management Facility  Visit type: Interventional Procedure   Primary Reason for Visit: Interventional Pain Management Treatment. CC: Back Pain  Procedure:          Anesthesia, Analgesia, Anxiolysis:  Type: Thermal Lumbar Facet, Medial Branch Radiofrequency Ablation/Neurotomy  #3   Primary Purpose: Therapeutic Region: Posterolateral Lumbosacral Spine Level:  L3, L4, L5,  Medial Branch Level(s). These levels will denervate the L3-4, L4-5 lumbar facet joints. Laterality: Right  Type: Moderate (Conscious) Sedation combined with Local Anesthesia Indication(s): Analgesia and Anxiety Route: Intravenous (IV) IV Access: Secured Sedation: Meaningful verbal contact was maintained at all times during the procedure  Local Anesthetic: Lidocaine 1-2%  Position: Prone   Indications: 1. Lumbar facet arthropathy (R>L)   2. Lumbar spondylosis   3. Chronic pain syndrome    Candace Clayton has been dealing with the above chronic pain for longer than three months and has either failed to respond, was unable to tolerate, or simply did not get enough benefit from other more conservative therapies including, but not limited to: 1. Over-the-counter medications 2. Anti-inflammatory medications 3. Muscle relaxants 4. Membrane stabilizers 5. Opioids 6. Physical therapy and/or chiropractic manipulation 7. Modalities (Heat, ice, etc.) 8. Invasive techniques such as nerve blocks. Candace Clayton has attained more than 50% relief of the pain from a series of diagnostic injections conducted in separate occasions.  Pain Score: Pre-procedure: 6 /10 Post-procedure: 0-No pain (walking and moving)/10  Pre-op Assessment:   Candace Clayton is a 60 y.o. (year old), female patient, seen today for interventional treatment. She  has a past surgical history that includes Cesarean section (1992); Finger surgery (Left, 2011); Hematoma evacuation (Left, 1999); Colonoscopy; Laparoscopic salpingo oophorectomy (Left, 04/02/2018); Colonoscopy with propofol (N/A, 03/14/2019); and Cholecystectomy (10/26/2020). Candace Clayton has a current medication list which includes the following prescription(s): albuterol, amlodipine, buspirone, cetirizine, desvenlafaxine, enalapril, ergocalciferol, fluticasone, fluticasone-salmeterol, contour next test, hydrochlorothiazide, hydroxyzine, melatonin, metformin, metoprolol succinate, microlet lancets, multiple vitamins-minerals, NON FORMULARY, ondansetron, OVER THE COUNTER MEDICATION, oxycodone, [START ON 12/04/2020] oxycodone-acetaminophen, oxycodone-acetaminophen, pregabalin, rosuvastatin, sitagliptin, oxycodone-acetaminophen, and [DISCONTINUED] metoprolol tartrate. Her primarily concern today is the Back Pain  Initial Vital Signs:  Pulse/HCG Rate: (!) 59  Temp: 97.8 F (36.6 C) Resp: 16 BP: (!) 153/87 SpO2: 99 %  BMI: Estimated body mass index is 47.26 kg/m as calculated from the following:   Height as of this encounter: 5\' 9"  (1.753 m).   Weight as of this encounter: 320 lb (145.2 kg).  Risk Assessment: Allergies: Reviewed. She is allergic to gabapentin.  Allergy Precautions: None required Coagulopathies: Reviewed. None identified.  Blood-thinner therapy: None at this time Active Infection(s): Reviewed. None identified. Candace Clayton is afebrile  Site Confirmation: Candace Clayton was asked to confirm the procedure and laterality before marking the site Procedure checklist: Completed Consent: Before the procedure and under the influence of no sedative(s), amnesic(s), or anxiolytics, the patient was informed of the treatment options, risks and possible complications. To fulfill our ethical and legal  obligations, as recommended by the American Medical Association's Code of Ethics, I have informed the patient of my clinical impression; the nature and purpose of the treatment or procedure; the risks, benefits, and possible complications of the intervention; the alternatives, including doing  nothing; the risk(s) and benefit(s) of the alternative treatment(s) or procedure(s); and the risk(s) and benefit(s) of doing nothing. The patient was provided information about the general risks and possible complications associated with the procedure. These may include, but are not limited to: failure to achieve desired goals, infection, bleeding, organ or nerve damage, allergic reactions, paralysis, and death. In addition, the patient was informed of those risks and complications associated to Spine-related procedures, such as failure to decrease pain; infection (i.e.: Meningitis, epidural or intraspinal abscess); bleeding (i.e.: epidural hematoma, subarachnoid hemorrhage, or any other type of intraspinal or peri-dural bleeding); organ or nerve damage (i.e.: Any type of peripheral nerve, nerve root, or spinal cord injury) with subsequent damage to sensory, motor, and/or autonomic systems, resulting in permanent pain, numbness, and/or weakness of one or several areas of the body; allergic reactions; (i.e.: anaphylactic reaction); and/or death. Furthermore, the patient was informed of those risks and complications associated with the medications. These include, but are not limited to: allergic reactions (i.e.: anaphylactic or anaphylactoid reaction(s)); adrenal axis suppression; blood sugar elevation that in diabetics may result in ketoacidosis or comma; water retention that in patients with history of congestive heart failure may result in shortness of breath, pulmonary edema, and decompensation with resultant heart failure; weight gain; swelling or edema; medication-induced neural toxicity; particulate matter embolism and  blood vessel occlusion with resultant organ, and/or nervous system infarction; and/or aseptic necrosis of one or more joints. Finally, the patient was informed that Medicine is not an exact science; therefore, there is also the possibility of unforeseen or unpredictable risks and/or possible complications that may result in a catastrophic outcome. The patient indicated having understood very clearly. We have given the patient no guarantees and we have made no promises. Enough time was given to the patient to ask questions, all of which were answered to the patient's satisfaction. Ms. Tatem has indicated that she wanted to continue with the procedure. Attestation: I, the ordering provider, attest that I have discussed with the patient the benefits, risks, side-effects, alternatives, likelihood of achieving goals, and potential problems during recovery for the procedure that I have provided informed consent. Date  Time: 11/15/2020  9:43 AM  Pre-Procedure Preparation:  Monitoring: As per clinic protocol. Respiration, ETCO2, SpO2, BP, heart rate and rhythm monitor placed and checked for adequate function Safety Precautions: Patient was assessed for positional comfort and pressure points before starting the procedure. Time-out: I initiated and conducted the "Time-out" before starting the procedure, as per protocol. The patient was asked to participate by confirming the accuracy of the "Time Out" information. Verification of the correct person, site, and procedure were performed and confirmed by me, the nursing staff, and the patient. "Time-out" conducted as per Joint Commission's Universal Protocol (UP.01.01.01). Time: 1045  Description of Procedure:          Laterality: Right Levels:   L3, L4, L5,Medial Branch Level(s), at the L3-4, L4-5  lumbar facet joints. Area Prepped: Lumbosacral Prepping solution: DuraPrep (Iodine Povacrylex [0.7% available iodine] and Isopropyl Alcohol, 74% w/w) Safety  Precautions: Aspiration looking for blood return was conducted prior to all injections. At no point did we inject any substances, as a needle was being advanced. Before injecting, the patient was told to immediately notify me if she was experiencing any new onset of "ringing in the ears, or metallic taste in the mouth". No attempts were made at seeking any paresthesias. Safe injection practices and needle disposal techniques used. Medications properly checked for expiration dates. SDV (  single dose vial) medications used. After the completion of the procedure, all disposable equipment used was discarded in the proper designated medical waste containers. Local Anesthesia: Protocol guidelines were followed. The patient was positioned over the fluoroscopy table. The area was prepped in the usual manner. The time-out was completed. The target area was identified using fluoroscopy. A 12-in long, straight, sterile hemostat was used with fluoroscopic guidance to locate the targets for each level blocked. Once located, the skin was marked with an approved surgical skin marker. Once all sites were marked, the skin (epidermis, dermis, and hypodermis), as well as deeper tissues (fat, connective tissue and muscle) were infiltrated with a small amount of a short-acting local anesthetic, loaded on a 10cc syringe with a 25G, 1.5-in  Needle. An appropriate amount of time was allowed for local anesthetics to take effect before proceeding to the next step. Local Anesthetic: Lidocaine 2.0% The unused portion of the local anesthetic was discarded in the proper designated containers. Technical explanation of process:  Radiofrequency Ablation (RFA)  L3 Medial Branch Nerve RFA: The target area for the L3 medial branch is at the junction of the postero-lateral aspect of the superior articular process and the superior, posterior, and medial edge of the transverse process of L4. Under fluoroscopic guidance, a Radiofrequency needle was  inserted until contact was made with os over the superior postero-lateral aspect of the pedicular shadow (target area). Sensory and motor testing was conducted to properly adjust the position of the needle. Once satisfactory placement of the needle was achieved, the numbing solution was slowly injected after negative aspiration for blood. 1 mL of the nerve block solution was injected without difficulty or complication. After waiting for at least 3 minutes, the ablation was performed. Once completed, the needle was removed intact. L4 Medial Branch Nerve RFA: The target area for the L4 medial branch is at the junction of the postero-lateral aspect of the superior articular process and the superior, posterior, and medial edge of the transverse process of L5. Under fluoroscopic guidance, a Radiofrequency needle was inserted until contact was made with os over the superior postero-lateral aspect of the pedicular shadow (target area). Sensory and motor testing was conducted to properly adjust the position of the needle. Once satisfactory placement of the needle was achieved, the numbing solution was slowly injected after negative aspiration for blood. 1 mL of the nerve block solution was injected without difficulty or complication. After waiting for at least 3 minutes, the ablation was performed. Once completed, the needle was removed intact. L5 Medial Branch Nerve RFA: The target area for the L5 medial branch is at the junction of the postero-lateral aspect of the superior articular process of S1 and the superior, posterior, and medial edge of the sacral ala. Under fluoroscopic guidance, a Radiofrequency needle was inserted until contact was made with os over the superior postero-lateral aspect of the pedicular shadow (target area). Sensory and motor testing was conducted to properly adjust the position of the needle. Once satisfactory placement of the needle was achieved, the numbing solution was slowly injected after  negative aspiration for blood. 37mL of the nerve block solution was injected without difficulty or complication. After waiting for at least 3 minutes, the ablation was performed. Once completed, the needle was removed intact.  Radiofrequency lesioning (ablation):  Radiofrequency Generator: NeuroTherm [id number] Sensory Stimulation Parameters: 50 Hz was used to locate & identify the nerve, making sure that the needle was positioned such that there was no sensory stimulation below 0.3  V or above 0.7 V. Motor Stimulation Parameters: 2 Hz was used to evaluate the motor component. Care was taken not to lesion any nerves that demonstrated motor stimulation of the lower extremities at an output of less than 2.5 times that of the sensory threshold, or a maximum of 2.0 V. Lesioning Technique Parameters: Standard Radiofrequency settings. (Not bipolar or pulsed.) Temperature Settings: 80 degrees C Lesioning time: 60 seconds Intra-operative Compliance: Compliant Materials & Medications: Needle(s) (Electrode/Cannula) Type: Teflon-coated, curved tip, Radiofrequency needle(s) Gauge: 22G Length: 10cm Numbing solution: 5 cc solution made of 2 cc of 0.2% ropivacaine, 2 cc of Decadron 10 mg/cc.  1 cc injected at each level above on the right after sensorimotor testing prior to lesioning.  The unused portion of the solution was discarded in the proper designated containers.  Once the entire procedure was completed, the treated area was cleaned, making sure to leave some of the prepping solution back to take advantage of its long term bactericidal properties.  Illustration of the posterior view of the lumbar spine and the posterior neural structures. Laminae of L2 through S1 are labeled. DPRL5, dorsal primary ramus of L5; DPRS1, dorsal primary ramus of S1; DPR3, dorsal primary ramus of L3; FJ, facet (zygapophyseal) joint L3-L4; I, inferior articular process of L4; LB1, lateral branch of dorsal primary ramus of L1; IAB,  inferior articular branches from L3 medial branch (supplies L4-L5 facet joint); IBP, intermediate branch plexus; MB3, medial branch of dorsal primary ramus of L3; NR3, third lumbar nerve root; S, superior articular process of L5; SAB, superior articular branches from L4 (supplies L4-5 facet joint also); TP3, transverse process of L3.  Vitals:   11/15/20 1035 11/15/20 1045 11/15/20 1055 11/15/20 1100  BP: (!) 145/105 (!) 160/107 (!) 159/101 (!) 160/100  Pulse: (!) 59 (!) 55 (!) 58 61  Resp: 20 20 20 18   Temp:      TempSrc:      SpO2: 97% 96% 96% 97%  Weight:      Height:        Start Time: 1045 hrs. End Time:   hrs.  Imaging Guidance (Spinal):          Type of Imaging Technique: Fluoroscopy Guidance (Spinal) Indication(s): Assistance in needle guidance and placement for procedures requiring needle placement in or near specific anatomical locations not easily accessible without such assistance. Exposure Time: Please see nurses notes. Contrast: None used. Fluoroscopic Guidance: I was personally present during the use of fluoroscopy. "Tunnel Vision Technique" used to obtain the best possible view of the target area. Parallax error corrected before commencing the procedure. "Direction-depth-direction" technique used to introduce the needle under continuous pulsed fluoroscopy. Once target was reached, antero-posterior, oblique, and lateral fluoroscopic projection used confirm needle placement in all planes. Images permanently stored in EMR. Interpretation: No contrast injected. I personally interpreted the imaging intraoperatively. Adequate needle placement confirmed in multiple planes. Permanent images saved into the patient's record.   Post-operative Assessment:  Post-procedure Vital Signs:  Pulse/HCG Rate: 61 (sb)  Temp: 97.8 F (36.6 C) Resp: 18 BP: (!) 160/100 SpO2: 97 %  EBL: None  Complications: No immediate post-treatment complications observed by team, or reported by  patient.  Note: The patient tolerated the entire procedure well. A repeat set of vitals were taken after the procedure and the patient was kept under observation following institutional policy, for this type of procedure. Post-procedural neurological assessment was performed, showing return to baseline, prior to discharge. The patient was provided with post-procedure discharge  instructions, including a section on how to identify potential problems. Should any problems arise concerning this procedure, the patient was given instructions to immediately contact us, at any time, without hesitation. In any case, we plan to contact the patient by telephone for a follow-up status report regarding this interventional procedure.  Comments:  No additional relevant information.  Plan of Care  Orders:  Orders Placed This Encounter  Procedures  . Radiofrequency,Lumbar    Standing Status:   Future    Standing Expiration Date:   11/15/2021    Scheduling Instructions:     Side(s): Left-sided     Level: L3-4, L4-5, acets (L3, L4, L5,  Medial Branch Nerves)     Sedation: Patient's choice.     Scheduling Timeframe: As soon as pre-approved    Order Specific Question:   Where will this procedure be performed?    Answer:   [facility]  . [facility] C-ARM 1-60 MIN NO REPORT    Intraoperative interpretation by procedural physician at [facility].    Standing Status:   Standing    Number of Occurrences:   1    Order Specific Question:   Reason for exam:    Answer:   Assistance in needle guidance and placement for procedures requiring needle placement in or near specific anatomical locations not easily accessible without such assistance.     Medications ordered for procedure: Meds ordered this encounter  Medications  . lidocaine (XYLOCAINE) 2 % (with pres) injection 400 mg  . ropivacaine (PF) 2 mg/mL (0.2%) (NAROPIN) injection 9 mL  . dexamethasone (DECADRON) injection 10 mg     Medications administered: We administered lidocaine, ropivacaine (PF) 2 mg/mL (0.2%), and dexamethasone.  See the medical record for exact dosing, route, and time of administration.  Follow-up plan:   Return in about 3 weeks (around 12/06/2020) for LEFT L3,4,5 RFA .    Recent Visits Date Type Provider Dept  09/30/20 Office Visit Edward Jolly, MD [facility]  08/18/20 Telemedicine Edward Jolly, MD [facility]  Showing recent visits within past 90 days and meeting all other requirements Today's Visits Date Type Provider Dept  11/15/20 Procedure visit Edward Jolly, MD [facility]  Showing today's visits and meeting all other requirements Future Appointments Date Type Provider Dept  12/28/20 Appointment Edward Jolly, MD [facility]  Showing future appointments within next 90 days and meeting all other requirements  Disposition: Discharge home  Discharge Date & Time: 11/15/2020;   hrs.   Primary Care Physician: Lyndon Code, MD Location: [facility] Note by: Edward Jolly, MD Date: 11/15/2020; Time: 12:27 PM  Disclaimer:  Medicine is not an exact science. The only guarantee in medicine is that nothing is guaranteed. It is important to note that the decision to proceed with this intervention was based on the information collected from the patient. The Data and conclusions were drawn from the patient's questionnaire, the interview, and the physical examination. Because the information was provided in large part by the patient, it cannot be guaranteed that it has not been purposely or unconsciously manipulated. Every effort has been made to obtain as much relevant data as possible for this evaluation. It is important to note that the conclusions that lead to this procedure are derived in large part from the available data. Always take into account that the treatment will also be dependent on availability of resources and  existing treatment guidelines, considered by other Pain  Management Practitioners as being common knowledge and practice, at the time of the intervention. For Medico-Legal purposes, it is also important to point out that variation in procedural techniques and pharmacological choices are the acceptable norm. The indications, contraindications, technique, and results of the above procedure should only be interpreted and judged by a Board-Certified Interventional Pain Specialist with extensive familiarity and expertise in the same exact procedure and technique.

## 2020-11-15 NOTE — Patient Instructions (Signed)

## 2020-11-15 NOTE — Progress Notes (Signed)
[facility] [place], [place] [address]  Internal MEDICINE  Office Visit Note  Patient Name: Candace Clayton  [id number]  [id number]  Date of Service: 11/16/2020  Chief Complaint  Patient presents with  . Follow-up    Dyspnea and ct results med refills  . Quality Metric Gaps    Eye exam    HPI  Candace Clayton was seen approx. 2 weeks ago for a hospital follow up s/p ERCP and cholescystectomy. She represents for a follow up visit today after adding an Advair and adjusting her metoprolol at her previous visit.  She has also had a CT chest w/o contrast done.  - blood pressure is improved today, 142/72. Her blood pressure 2 weeks ago was 170/80. Her metoprolol was changed from tartrate to succinate, dose over 24 hours remains the same. The extended release of metoprolol succinate seems to work better for her.   - Candace Clayton is still reporting cough, hoarseness, shortness of breath, dyspnea and feeling like she is unable to take a full breath, like she is working to pull air in. Coughing up brown sputum, was intubated for procedures twice while hospitalized. She states that the advair inhaler has not been helping very much. She typically takes her inhaler in the evening so she has not taken her inhaler today yet.   -CT Chest w/o contrast results: 1. Scattered small pulmonary nodules, many of which are subpleural, scattered throughout both lungs. Largest nodule measures 5 mm in the left lower lobe. No follow-up needed if patient is low-risk (and has no known or suspected primary neoplasm). Non-contrast chest CT can be considered in 12 months if patient is high-risk. This recommendation follows the consensus statement: Guidelines for Management of Incidental Pulmonary Nodules Detected on CT Images: From the Fleischner Society 2017; Radiology 2017; 284:228-243. 2. Dilated main pulmonary artery, suggesting pulmonary arterial hypertension. 3. Small area of subpleural consolidation  in the medial right lower lobe, typical of compressive atelectasis related to adjacent thoracic osteophytes, but nonspecific. Possibility of infection is not entirely excluded. 4. Hepatomegaly. Small amount of air and fluid in the gallbladder fossa is likely related to recent cholecystectomy, only partially included in the field of view. 5. Aortic atherosclerosis. Minimal coronary artery calcifications. Mitral annulus calcifications. -She has a positive family history of cancer, especially lung cancer. Her maternal aunt died of lung cancer and was not a smoker. Her maternal uncle was a smoker and diet of lung cancer as well. Her mother died of brain cancer but had also had colon cancer x1 and breast cancer x3. Her brother had colon cancer but is now in remission. Her father did not have cancer but died of heart disease.   -hepatomegaly, with the addition of a mass found in the right love of the liver incidentally on an ultrasound of the abdomen, an MRI w/ and w/o contrast is recommended.  -Discussed the possible pulmonary arterial hypertension, aortic atherosclerosis and the thoracic bone spurs. Patient states she would like everything checked or looked into that needs further evaluation.     Current Medication: Outpatient Encounter Medications as of 11/15/2020  Medication Sig  . albuterol (VENTOLIN HFA) 108 (90 Base) MCG/ACT inhaler TAKE 2 PUFFS BY MOUTH EVERY 6 HOURS AS NEEDED FOR WHEEZE OR SHORTNESS OF BREATH  . amLODipine (NORVASC) 5 MG tablet Take 1 tablet (5 mg total) by mouth daily.  . busPIRone (BUSPAR) 15 MG tablet Take 1 tablet (15 mg total) by mouth 3 (three) times daily. Take on  tablet TID for anxiety.  . cetirizine (ZYRTEC) 10 MG tablet TAKE 1 TABLET(S) BY MOUTH DAILY FOR ALLERGIES  . enalapril (VASOTEC) 20 MG tablet Take 1 tablet (20 mg total) by mouth 2 (two) times daily.  . fluticasone (FLONASE) 50 MCG/ACT nasal spray Place 2 sprays into both nostrils daily.  .  Fluticasone-Umeclidin-Vilant (TRELEGY ELLIPTA) 100-62.5-25 MCG/INH AEPB Inhale 1 puff into the lungs daily.  Marland Kitchen glucose blood (CONTOUR NEXT TEST) test strip 1 each by Other route daily. DX E11.65  . hydrochlorothiazide (HYDRODIURIL) 25 MG tablet Take 1 tablet (25 mg total) by mouth daily.  . hydrOXYzine (ATARAX/VISTARIL) 10 MG tablet Take 1 tablet (10 mg total) by mouth 3 (three) times daily as needed.  . melatonin 3 MG TABS tablet Take by mouth.  . metFORMIN (GLUCOPHAGE) 500 MG tablet Take 1 tablet (500 mg total) by mouth 2 (two) times daily with a meal.  . metoprolol succinate (TOPROL-XL) 100 MG 24 hr tablet Take 1 tablet (100 mg total) by mouth daily. Take with or immediately following a meal.  . Microlet Lancets MISC 1 each by Does not apply route daily. [id number]  . Multiple Vitamins-Calcium (ONE-A-DAY WOMENS PO) Take 1 tablet by mouth daily.  . NON FORMULARY cpap device  . ondansetron (ZOFRAN-ODT) 4 MG disintegrating tablet Take by mouth.  Marland Kitchen OVER THE COUNTER MEDICATION 1 tablet daily as needed. Takes generic stool softener for constipation d/t oxycodone.   Marland Kitchen oxyCODONE (OXY IR/ROXICODONE) 5 MG immediate release tablet Take by mouth.  Derrill Memo ON 12/04/2020] oxyCODONE-acetaminophen (PERCOCET/ROXICET) 5-325 MG tablet Take 1 tablet by mouth every 8 (eight) hours as needed for severe pain.  Marland Kitchen oxyCODONE-acetaminophen (PERCOCET/ROXICET) 5-325 MG tablet Take 1 tablet by mouth every 8 (eight) hours as needed for severe pain.  . pregabalin (LYRICA) 100 MG capsule 100 mg qAM, 100-200 mg qhs  . rosuvastatin (CRESTOR) 5 MG tablet Take 1 tablet (5 mg total) by mouth daily.  . sitaGLIPtin (JANUVIA) 25 MG tablet Take 1 tablet (25 mg total) by mouth daily.  . [DISCONTINUED] desvenlafaxine (PRISTIQ) 100 MG 24 hr tablet Take 1 tablet (100 mg total) by mouth daily.  . [DISCONTINUED] ergocalciferol (DRISDOL) 1.25 MG (50000 UT) capsule Take 1 capsule (50,000 Units total) by mouth once a week.  . [DISCONTINUED]  fluticasone-salmeterol (ADVAIR) 100-50 MCG/ACT AEPB Inhale 1 puff into the lungs 2 (two) times daily. Rinse mouth after each use  . desvenlafaxine (PRISTIQ) 100 MG 24 hr tablet Take 1 tablet (100 mg total) by mouth daily.  . ergocalciferol (DRISDOL) 1.25 MG (50000 UT) capsule Take 1 capsule (50,000 Units total) by mouth once a week.  Marland Kitchen oxyCODONE-acetaminophen (PERCOCET/ROXICET) 5-325 MG tablet Take 1 tablet by mouth every 8 (eight) hours as needed for severe pain.  . [DISCONTINUED] metoprolol tartrate (LOPRESSOR) 25 MG tablet Take 2 tablets (50 mg total) by mouth 2 (two) times daily.   No facility-administered encounter medications on file as of 11/15/2020.    Surgical History: Past Surgical History:  Procedure Laterality Date  . CESAREAN SECTION  1992  . CHOLECYSTECTOMY  10/26/2020  . COLONOSCOPY     polyps removed first procedure. 2nd time all was clear  . COLONOSCOPY WITH PROPOFOL N/A 03/14/2019   Procedure: COLONOSCOPY WITH PROPOFOL;  Surgeon: Lin Landsman, MD;  Location[facility] ENDOSCOPY;  Service: Gastroenterology;  Laterality: N/A;  . FINGER SURGERY Left 2011   left finger cut off x 2.(only up to last digit)  . HEMATOMA EVACUATION Left 1999   upper part of  foot was injured d/t 500lb weight landing on her foot.   Marland Kitchen LAPAROSCOPIC SALPINGO OOPHERECTOMY Left 04/02/2018   Procedure: LAPAROSCOPIC SALPINGO OOPHORECTOMY;  Surgeon: Malachy Mood, MD;  Location: [facility] ORS;  Service: Gynecology;  Laterality: Left;    Medical History: Past Medical History:  Diagnosis Date  . Anginal pain (HCC)    tightness related to anxiety  . Anxiety   . Cholecystolithiasis   . Depression   . Diabetes mellitus without complication ([place])   . GERD (gastroesophageal reflux disease)    throws up easily but not diagnosed with reflux  . History of C-section 39  . Hypertension   . Sleep apnea    uses cpap    Family History: Family History  Problem Relation Age of Onset  . Breast cancer  Mother 40       x 3 times  . Colon cancer Mother   . Brain cancer Mother   . Cancer - Colon Mother   . Cancer - Other Mother   . Heart Problems Father   . Colon cancer Brother   . Heart Problems Brother     Social History   Socioeconomic History  . Marital status: Divorced    Spouse name: Not on file  . Number of children: 1  . Years of education: Not on file  . Highest education level: Not on file  Occupational History  . Occupation: works in Secretary/administrator  Tobacco Use  . Smoking status: Never Smoker  . Smokeless tobacco: Never Used  Vaping Use  . Vaping Use: Never used  Substance and Sexual Activity  . Alcohol use: No  . Drug use: No  . Sexual activity: Not Currently    Birth control/protection: Post-menopausal  Other Topics Concern  . Not on file  Social History Narrative   Son has schizophrenia and autism but is fully capable of helping mother after surgery   Social Determinants of Health   Financial Resource Strain: Not on file  Food Insecurity: Not on file  Transportation Needs: Not on file  Physical Activity: Not on file  Stress: Not on file  Social Connections: Not on file  Intimate Partner Violence: Not on file      Review of Systems  Constitutional: Positive for fatigue. Negative for chills, fever and unexpected weight change.  HENT: Negative.   Respiratory: Positive for cough, chest tightness, shortness of breath and wheezing.   Cardiovascular: Negative for chest pain and palpitations.  Gastrointestinal: Negative for abdominal pain, blood in stool, constipation, diarrhea, nausea and vomiting.  Genitourinary: Negative.  Negative for dysuria.  Musculoskeletal: Negative.   Allergic/Immunologic: Negative.   Neurological: Negative.  Negative for headaches.  Psychiatric/Behavioral: Positive for behavioral problems. The patient is nervous/anxious.     Vital Signs: BP (!) 142/72   Pulse 88   Temp 98.7 F (37.1 C)   Resp 16   Ht 5\' 9"  (1.753 m)    Wt (!) 342 lb 3.2 oz (155.2 kg)   SpO2 94%   BMI 50.53 kg/m    Physical Exam Vitals reviewed.  Constitutional:      General: She is not in acute distress.    Appearance: She is well-developed. She is morbidly obese. She is not ill-appearing.     Comments: BMI 50.53  Cardiovascular:     Rate and Rhythm: Normal rate and regular rhythm.     Pulses: Normal pulses.     Heart sounds: Normal heart sounds.  Pulmonary:     Effort: Prolonged expiration  present. No tachypnea or respiratory distress.     Breath sounds: Normal air entry. Examination of the right-lower field reveals decreased breath sounds. Examination of the left-lower field reveals decreased breath sounds. Decreased breath sounds present. No wheezing, rhonchi or rales.     Comments: Observed having shortness of breath while talking intermittently.  Neurological:     Mental Status: She is alert.  Psychiatric:        Attention and Perception: Attention and perception normal.        Mood and Affect: Mood is depressed. Affect is tearful.        Speech: Speech normal.        Behavior: Behavior normal. Behavior is cooperative.        Thought Content: Thought content normal.        Cognition and Memory: Cognition and memory normal.        Judgment: Judgment normal.    Assessment/Plan:  1. COPD without exacerbation ([place]) Advair has not improved Candace Clayton's breathing. Two week sample of Trelegy provided to the patient with the co-pay assistance information. Order sent to pharmacy. Provided instruction to patient during visit and she took her first dose during the visit.  - Fluticasone-Umeclidin-Vilant (TRELEGY ELLIPTA) 100-62.5-25 MCG/INH AEPB; Inhale 1 puff into the lungs daily.  Dispense: 1 each; Refill: 11  2. Pulmonary arterial hypertension (HCC) CT of the chest showed possible pulmonary arterial hypertension.  Recommended work-up includes brain natruretic peptide which can be elevated in the presence of pulmonary arterial  hypertension.  An echocardiogram is also recommended.  The echo and the BNP level were both ordered. - ECHOCARDIOGRAM COMPLETE; Future - Brain natriuretic peptide; Future  3. Essential hypertension, benign The patient's blood pressure is much improved today when compared to her previous visit.  Her blood pressure at her previous visit was 170/80.  Today's blood pressure was 142/72.  Her metoprolol was adjusted, switched from metoprolol tartrate to metoprolol succinate and instead of having the dose divided into a morning and evening dose, she is taking the same amount (100mg ) but in the form of a 24 hr extended release medication.  4. Obstructive sleep apnea Candace Clayton has obstructive sleep apnea and uses CPAP at night.  She reports that this greatly helps her sleep and breathing.  She wakes up feeling well rested compared to before she started using CPAP.  5. Mass of right lobe of liver A mass in the right lobe of the liver was incidentally found on an abdominal ultrasound and MRI of the abdomen with and without contrast was recommended to evaluate. - MR Abdomen W Wo Contrast; Future  6. Hepatomegaly Per the CT scan, Candace Clayton's liver is enlarged.  A hepatic function panel is ordered to check her liver enzymes.  An MRI of the abdomen with and without contrast was ordered due to the mass found in the right lobe of the liver which may or may not be related to the hepatomegaly. - Hepatic function panel  7. Major depressive disorder, recurrent episode, moderate ([place]) Candace Clayton has a history of depression. she takes desvenlafaxine, she reports that this medication works for her and asked for refills to be ordered.  Although she does seem to have a depressed mood and tearful affect, this may be due to the fact that she is having difficulty with her breathing and was feeling very worried about discussing her CT scan today. - desvenlafaxine (PRISTIQ) 100 MG 24 hr tablet; Take 1 tablet (100 mg total) by mouth daily.  Dispense: 90 tablet; Refill: 1  8. Vitamin D deficiency Candace Clayton has a history of vitamin D deficiency. She takes the 50,000 unit capsule once weekly vitamin D. She reports that she lost her prescription bottle of her vitamin D and asked if another could be reordered.  Refills ordered. - ergocalciferol (DRISDOL) 1.25 MG (50000 UT) capsule; Take 1 capsule (50,000 Units total) by mouth once a week.  Dispense: 4 capsule; Refill: 5   General Counseling: Candace Clayton verbalizes understanding of the findings of todays visit and agrees with plan of treatment. I have discussed any further diagnostic evaluation that may be needed or ordered today. We also reviewed her medications today. she has been encouraged to call the office with any questions or concerns that should arise related to todays visit.    Orders Placed This Encounter  Procedures  . MR Abdomen W Wo Contrast  . Brain natriuretic peptide  . Hepatic function panel  . ECHOCARDIOGRAM COMPLETE    Meds ordered this encounter  Medications  . Fluticasone-Umeclidin-Vilant (TRELEGY ELLIPTA) 100-62.5-25 MCG/INH AEPB    Sig: Inhale 1 puff into the lungs daily.    Dispense:  1 each    Refill:  11  . ergocalciferol (DRISDOL) 1.25 MG (50000 UT) capsule    Sig: Take 1 capsule (50,000 Units total) by mouth once a week.    Dispense:  4 capsule    Refill:  5  . desvenlafaxine (PRISTIQ) 100 MG 24 hr tablet    Sig: Take 1 tablet (100 mg total) by mouth daily.    Dispense:  90 tablet    Refill:  1   Return in about 2 weeks (around 11/29/2020) for F/U, Echo @ [place], [place], [place] PCP, Need MR.  Total time spent: 30 Minutes Time spent includes review of chart, medications, test results, and follow up plan with the patient.   [place] Controlled Substance Database was reviewed by me.  This patient was seen by Jonetta Osgood, FNP-C in collaboration with Dr. Clayborn Bigness as a part of collaborative care agreement.   Dr Lavera Guise Internal medicine

## 2020-11-15 NOTE — Progress Notes (Signed)
Safety precautions to be maintained throughout the outpatient stay will include: orient to surroundings, keep bed in low position, maintain call bell within reach at all times, provide assistance with transfer out of bed and ambulation.  

## 2020-11-16 ENCOUNTER — Telehealth: Payer: Self-pay | Admitting: *Deleted

## 2020-11-16 NOTE — Telephone Encounter (Signed)
Attempted to call for post procedure follow-up. Message left. 

## 2020-11-17 ENCOUNTER — Ambulatory Visit
Payer: No Typology Code available for payment source | Admitting: Student in an Organized Health Care Education/Training Program

## 2020-11-19 LAB — HEPATIC FUNCTION PANEL
ALT: 24 IU/L (ref 0–32)
AST: 18 IU/L (ref 0–40)
Albumin: 4.1 g/dL (ref 3.8–4.9)
Alkaline Phosphatase: 120 IU/L (ref 44–121)
Bilirubin Total: 0.4 mg/dL (ref 0.0–1.2)
Bilirubin, Direct: 0.2 mg/dL (ref 0.00–0.40)
Total Protein: 6.4 g/dL (ref 6.0–8.5)

## 2020-11-20 ENCOUNTER — Other Ambulatory Visit: Payer: Self-pay | Admitting: Nurse Practitioner

## 2020-11-24 ENCOUNTER — Encounter: Payer: Self-pay | Admitting: Nurse Practitioner

## 2020-11-24 ENCOUNTER — Ambulatory Visit (INDEPENDENT_AMBULATORY_CARE_PROVIDER_SITE_OTHER): Payer: No Typology Code available for payment source | Admitting: Nurse Practitioner

## 2020-11-24 ENCOUNTER — Other Ambulatory Visit: Payer: Self-pay

## 2020-11-24 DIAGNOSIS — R160 Hepatomegaly, not elsewhere classified: Secondary | ICD-10-CM | POA: Diagnosis not present

## 2020-11-24 DIAGNOSIS — I2721 Secondary pulmonary arterial hypertension: Secondary | ICD-10-CM

## 2020-11-24 DIAGNOSIS — Z0001 Encounter for general adult medical examination with abnormal findings: Secondary | ICD-10-CM | POA: Diagnosis not present

## 2020-11-24 DIAGNOSIS — E1165 Type 2 diabetes mellitus with hyperglycemia: Secondary | ICD-10-CM | POA: Diagnosis not present

## 2020-11-24 DIAGNOSIS — R300 Dysuria: Secondary | ICD-10-CM

## 2020-11-24 DIAGNOSIS — J449 Chronic obstructive pulmonary disease, unspecified: Secondary | ICD-10-CM | POA: Diagnosis not present

## 2020-11-24 LAB — POCT GLYCOSYLATED HEMOGLOBIN (HGB A1C): Hemoglobin A1C: 8.5 % — AB (ref 4.0–5.6)

## 2020-11-24 NOTE — Progress Notes (Signed)
[facility] [place], [place] [address]  Internal MEDICINE  Office Visit Note  Patient Name: Candace Clayton  [id number]  [id number]  Date of Service: 11/28/2020  Chief Complaint  Patient presents with   Annual Exam   Diabetes    A1c    Shortness of Breath    med review, patient states one of her new meds are making her sob    HPI Pt is here for annual CPE. Candace Clayton was seen approx. 1.5 weeks ago and was switched to [place] for her COPD. She represents for a follow up visit today after to evaluate effectiveness of medication changes and she is due to have her A1C checked.  - blood pressure remains stable since her last medication adjustment 1 month ago, 142/78 today. She continues to take the metoprolol succinate, amlodipine, and hydrochlorothiazide.  - Candace Clayton is still reporting cough, hoarseness, shortness of breath, dyspnea and feeling like she s unable to take a full breath, like she is working to pull air in. She states that she can feel the Trelegy reach deeper but she is still short of breath.  -Echocardiogram is scheduled for 12/15/20 to further evaluate pulmonary arterial hypertension identified on her CT scan.  -Hepatic function panel was wnl, despite hepatomegaly and cyst found in the right lobe of the liver. MRI of abdomen previously ordered but has not been scheduled yet.  -A1C is 8.5 today (previously 7.9). currently taking januvia and metformin.      Current Medication: Outpatient Encounter Medications as of 11/24/2020  Medication Sig   albuterol (VENTOLIN HFA) 108 (90 Base) MCG/ACT inhaler TAKE 2 PUFFS BY MOUTH EVERY 6 HOURS AS NEEDED FOR WHEEZE OR SHORTNESS OF BREATH   amLODipine (NORVASC) 5 MG tablet Take 1 tablet (5 mg total) by mouth daily.   Budeson-Glycopyrrol-Formoterol (BREZTRI AEROSPHERE) 160-9-4.8 MCG/ACT AERO Inhale 2 puffs into the lungs 2 (two) times daily.   busPIRone (BUSPAR) 15 MG tablet Take 1 tablet (15 mg total) by mouth 3  (three) times daily. Take on tablet TID for anxiety.   cetirizine (ZYRTEC) 10 MG tablet TAKE 1 TABLET(S) BY MOUTH DAILY FOR ALLERGIES   desvenlafaxine (PRISTIQ) 100 MG 24 hr tablet Take 1 tablet (100 mg total) by mouth daily.   Dulaglutide 0.75 MG/0.5ML SOPN Inject 0.75 mg into the skin once a week.   enalapril (VASOTEC) 20 MG tablet Take 1 tablet (20 mg total) by mouth 2 (two) times daily.   ergocalciferol (DRISDOL) 1.25 MG (50000 UT) capsule Take 1 capsule (50,000 Units total) by mouth once a week.   fluticasone (FLONASE) 50 MCG/ACT nasal spray Place 2 sprays into both nostrils daily.   glucose blood (CONTOUR NEXT TEST) test strip 1 each by Other route daily. DX E11.65   hydrochlorothiazide (HYDRODIURIL) 25 MG tablet Take 1 tablet (25 mg total) by mouth daily.   hydrOXYzine (ATARAX/VISTARIL) 10 MG tablet Take 1 tablet (10 mg total) by mouth 3 (three) times daily as needed.   melatonin 3 MG TABS tablet Take by mouth.   metFORMIN (GLUCOPHAGE) 500 MG tablet Take 1 tablet (500 mg total) by mouth 2 (two) times daily with a meal.   metoprolol succinate (TOPROL-XL) 100 MG 24 hr tablet Take 1 tablet (100 mg total) by mouth daily. Take with or immediately following a meal.   Microlet Lancets MISC 1 each by Does not apply route daily. DX-E11.65   Multiple Vitamins-Calcium (ONE-A-DAY WOMENS PO) Take 1 tablet by mouth daily.   NON  FORMULARY cpap device   ondansetron (ZOFRAN-ODT) 4 MG disintegrating tablet Take by mouth.   OVER THE COUNTER MEDICATION 1 tablet daily as needed. Takes generic stool softener for constipation d/t oxycodone.    oxyCODONE (OXY IR/ROXICODONE) 5 MG immediate release tablet Take by mouth.   [START ON 12/04/2020] oxyCODONE-acetaminophen (PERCOCET/ROXICET) 5-325 MG tablet Take 1 tablet by mouth every 8 (eight) hours as needed for severe pain.   oxyCODONE-acetaminophen (PERCOCET/ROXICET) 5-325 MG tablet Take 1 tablet by mouth every 8 (eight) hours as needed for severe pain.   pregabalin  (LYRICA) 100 MG capsule 100 mg qAM, 100-200 mg qhs   rosuvastatin (CRESTOR) 5 MG tablet Take 1 tablet (5 mg total) by mouth daily.   [DISCONTINUED] Fluticasone-Umeclidin-Vilant (TRELEGY ELLIPTA) 100-62.5-25 MCG/INH AEPB Inhale 1 puff into the lungs daily.   [DISCONTINUED] sitaGLIPtin (JANUVIA) 25 MG tablet Take 1 tablet (25 mg total) by mouth daily.   oxyCODONE-acetaminophen (PERCOCET/ROXICET) 5-325 MG tablet Take 1 tablet by mouth every 8 (eight) hours as needed for severe pain.   [DISCONTINUED] metoprolol tartrate (LOPRESSOR) 25 MG tablet Take 2 tablets (50 mg total) by mouth 2 (two) times daily.   No facility-administered encounter medications on file as of 11/24/2020.    Surgical History: Past Surgical History:  Procedure Laterality Date   CESAREAN SECTION  1992   CHOLECYSTECTOMY  10/26/2020   COLONOSCOPY     polyps removed first procedure. 2nd time all was clear   COLONOSCOPY WITH PROPOFOL N/A 03/14/2019   Procedure: COLONOSCOPY WITH PROPOFOL;  Surgeon: Lin Landsman, MD;  Location[facility] ENDOSCOPY;  Service: Gastroenterology;  Laterality: N/A;   FINGER SURGERY Left 2011   left finger cut off x 2.(only up to last digit)   HEMATOMA EVACUATION Left 1999   upper part of foot was injured d/t 500lb weight landing on her foot.    LAPAROSCOPIC SALPINGO OOPHERECTOMY Left 04/02/2018   Procedure: LAPAROSCOPIC SALPINGO OOPHORECTOMY;  Surgeon: Malachy Mood, MD;  Location: [facility] ORS;  Service: Gynecology;  Laterality: Left;    Medical History: Past Medical History:  Diagnosis Date   Anginal pain (HCC)    tightness related to anxiety   Anxiety    Cholecystolithiasis    Depression    Diabetes mellitus without complication (HCC)    GERD (gastroesophageal reflux disease)    throws up easily but not diagnosed with reflux   History of C-section 1992   Hypertension    Sleep apnea    uses cpap    Family History: Family History  Problem Relation Age of Onset   Breast cancer Mother  65       x 3 times   Colon cancer Mother    Brain cancer Mother    Cancer - Colon Mother    Cancer - Other Mother    Heart Problems Father    Colon cancer Brother    Heart Problems Brother     Social History   Socioeconomic History   Marital status: Divorced    Spouse name: Not on file   Number of children: 1   Years of education: Not on file   Highest education level: Not on file  Occupational History   Occupation: works in Secretary/administrator  Tobacco Use   Smoking status: Never Smoker   Smokeless tobacco: Never Used  Scientific laboratory technician Use: Never used  Substance and Sexual Activity   Alcohol use: No   Drug use: No   Sexual activity: Not Currently    Birth control/protection:  Post-menopausal  Other Topics Concern   Not on file  Social History Narrative   Son has schizophrenia and autism but is fully capable of helping mother after surgery   Social Determinants of Health   Financial Resource Strain: Not on file  Food Insecurity: Not on file  Transportation Needs: Not on file  Physical Activity: Not on file  Stress: Not on file  Social Connections: Not on file  Intimate Partner Violence: Not on file      Review of Systems  Constitutional: Negative for chills, fatigue and unexpected weight change.  HENT: Positive for congestion and postnasal drip. Negative for rhinorrhea, sneezing and sore throat.   Eyes: Negative for redness.  Respiratory: Positive for cough, shortness of breath and wheezing. Negative for chest tightness.   Cardiovascular: Negative.  Negative for chest pain and palpitations.  Gastrointestinal: Negative.  Negative for abdominal pain, constipation, diarrhea, nausea and vomiting.  Genitourinary: Negative for dysuria and frequency.  Musculoskeletal: Negative for arthralgias, back pain, joint swelling and neck pain.  Skin: Negative for rash.  Neurological: Negative.  Negative for tremors and numbness.  Hematological: Negative for adenopathy. Does  not bruise/bleed easily.  Psychiatric/Behavioral: Negative for behavioral problems (Depression), sleep disturbance and suicidal ideas. The patient is not nervous/anxious.     Vital Signs: BP (!) 142/78   Pulse 60   Temp 98.6 F (37 C)   Resp 16   Ht 5\' 9"  (1.753 m)   Wt (!) 339 lb 9.6 oz (154 kg)   SpO2 93%   BMI 50.15 kg/m    Physical Exam Vitals reviewed.  Constitutional:      General: She is not in acute distress.    Appearance: She is well-developed and well-groomed. She is morbidly obese. She is not ill-appearing.  HENT:     Head: Normocephalic and atraumatic.  Cardiovascular:     Rate and Rhythm: Normal rate and regular rhythm.     Pulses: Normal pulses.     Heart sounds: Normal heart sounds.  Pulmonary:     Effort: Pulmonary effort is normal.     Breath sounds: Examination of the right-upper field reveals wheezing. Examination of the left-upper field reveals wheezing. Examination of the right-lower field reveals decreased breath sounds. Examination of the left-lower field reveals decreased breath sounds. Decreased breath sounds and wheezing present.  Abdominal:     General: Bowel sounds are normal.     Palpations: Abdomen is soft. There is hepatomegaly. There is no splenomegaly or mass.     Tenderness: There is no abdominal tenderness. There is no guarding.  Skin:    Capillary Refill: Capillary refill takes less than 2 seconds.  Neurological:     Mental Status: She is alert and oriented to person, place, and time.  Psychiatric:        Mood and Affect: Mood normal.        Behavior: Behavior normal. Behavior is cooperative.    Assessment/Plan: 1. Encounter for general adult medical examination with abnormal findings Age appropriate PHM is updated   2. COPD without exacerbation ([place]) Trelegy was not helping enough, discontinued medication. Start Breztri, follow up in 4 weeks to evaluate effectiveness of new medication.  - Budeson-Glycopyrrol-Formoterol (BREZTRI  AEROSPHERE) 160-9-4.8 MCG/ACT AERO; Inhale 2 puffs into the lungs 2 (two) times daily.  Dispense: 10.7 g; Refill: 11  3. Type 2 diabetes mellitus with hyperglycemia, without long-term current use of insulin (HCC) A1C has increased to 8.5, patient started on trulicity. She administered the first  under provider supervision while in the office. She demonstrated and verbalized understanding, samples sent home with patient and prescription sent to pharmacy.  - POCT HgB A1C - Dulaglutide 0.75 MG/0.5ML SOPN; Inject 0.75 mg into the skin once a week.  Dispense: 4 mL; Refill: 1  4. Pulmonary arterial hypertension (HCC) Echocardiogram is scheduled to be done on 12/15/20.  5. Mass of right lobe of liver Waiting to schedule MRI of abdomen.   6. Dysuria Routine urinalysis done.  - UA/M w/rflx Culture, Routine - Microscopic Examination   General Counseling: Lynnzie verbalizes understanding of the findings of todays visit and agrees with plan of treatment. I have discussed any further diagnostic evaluation that may be needed or ordered today. We also reviewed her medications today. she has been encouraged to call the office with any questions or concerns that should arise related to todays visit.    Orders Placed This Encounter  Procedures   Microscopic Examination   UA/M w/rflx Culture, Routine   POCT HgB A1C    Meds ordered this encounter  Medications   Budeson-Glycopyrrol-Formoterol (BREZTRI AEROSPHERE) 160-9-4.8 MCG/ACT AERO    Sig: Inhale 2 puffs into the lungs 2 (two) times daily.    Dispense:  10.7 g    Refill:  11   Dulaglutide 0.75 MG/0.5ML SOPN    Sig: Inject 0.75 mg into the skin once a week.    Dispense:  4 mL    Refill:  1    Order Specific Question:   Supervising Provider    Answer:   Lavera Guise [[id number]]    Return in about 4 weeks (around 12/22/2020) for F/U diabetes and copd , Lugene Hitt PCP.   Total time spent:30 Minutes Time spent includes review of chart, medications, test  results, and follow up plan with the patient.   [place] Controlled Substance Database was reviewed by me.  This patient was seen by Jonetta Osgood, FNP-C in collaboration with Dr. Clayborn Bigness as a part of collaborative care agreement.  Ilene Witcher R. Valetta Fuller, MSN, FNP-C Internal medicine

## 2020-11-25 LAB — MICROSCOPIC EXAMINATION
Bacteria, UA: NONE SEEN
Casts: NONE SEEN /lpf
WBC, UA: NONE SEEN /hpf (ref 0–5)

## 2020-11-25 LAB — UA/M W/RFLX CULTURE, ROUTINE
Bilirubin, UA: NEGATIVE
Glucose, UA: NEGATIVE
Ketones, UA: NEGATIVE
Leukocytes,UA: NEGATIVE
Nitrite, UA: NEGATIVE
Protein,UA: NEGATIVE
RBC, UA: NEGATIVE
Specific Gravity, UA: 1.021 (ref 1.005–1.030)
Urobilinogen, Ur: 0.2 mg/dL (ref 0.2–1.0)
pH, UA: 5.5 (ref 5.0–7.5)

## 2020-11-28 MED ORDER — DULAGLUTIDE 0.75 MG/0.5ML ~~LOC~~ SOAJ: 0.7500 mg | SUBCUTANEOUS | 1 refills | Status: DC

## 2020-11-28 MED ORDER — BREZTRI AEROSPHERE 160-9-4.8 MCG/ACT IN AERO: 2.0000 | INHALATION_SPRAY | Freq: Two times a day (BID) | RESPIRATORY_TRACT | 11 refills | Status: DC

## 2020-12-01 ENCOUNTER — Telehealth: Payer: Self-pay

## 2020-12-01 NOTE — Telephone Encounter (Signed)
Patient dropped of return to work paperwork, placed in [employer]. Candace Clayton

## 2020-12-02 ENCOUNTER — Ambulatory Visit
Admission: RE | Admit: 2020-12-02 | Discharge: 2020-12-02 | Disposition: A | Discharge status: Home / Self Care | Payer: No Typology Code available for payment source | Source: Ambulatory Visit | Attending: Nurse Practitioner | Admitting: Nurse Practitioner

## 2020-12-02 ENCOUNTER — Other Ambulatory Visit: Payer: Self-pay

## 2020-12-02 DIAGNOSIS — R160 Hepatomegaly, not elsewhere classified: Secondary | ICD-10-CM | POA: Insufficient documentation

## 2020-12-02 MED ORDER — GADOBUTROL 1 MMOL/ML IV SOLN
10.0000 mL | Freq: Once | INTRAVENOUS | Status: AC | PRN
  Administered 2020-12-02: 10 mL via INTRAVENOUS

## 2020-12-06 ENCOUNTER — Ambulatory Visit
Payer: No Typology Code available for payment source | Admitting: Student in an Organized Health Care Education/Training Program

## 2020-12-09 ENCOUNTER — Telehealth: Payer: Self-pay

## 2020-12-09 NOTE — Telephone Encounter (Signed)
Verbal order for medical home equipment signed by provider and placed in [name] folder. Candace Clayton

## 2020-12-15 ENCOUNTER — Other Ambulatory Visit: Payer: No Typology Code available for payment source

## 2020-12-21 ENCOUNTER — Other Ambulatory Visit: Payer: Self-pay | Admitting: Nurse Practitioner

## 2020-12-21 ENCOUNTER — Telehealth: Payer: Self-pay

## 2020-12-21 ENCOUNTER — Other Ambulatory Visit: Payer: Self-pay | Admitting: Internal Medicine

## 2020-12-21 DIAGNOSIS — N3281 Overactive bladder: Secondary | ICD-10-CM

## 2020-12-21 DIAGNOSIS — I10 Essential (primary) hypertension: Secondary | ICD-10-CM

## 2020-12-21 NOTE — Telephone Encounter (Signed)
Left vm to screen for 12/22/20 appointment-Toni

## 2020-12-22 ENCOUNTER — Other Ambulatory Visit: Payer: Self-pay

## 2020-12-22 ENCOUNTER — Ambulatory Visit: Payer: No Typology Code available for payment source | Admitting: Nurse Practitioner

## 2020-12-22 ENCOUNTER — Ambulatory Visit: Payer: No Typology Code available for payment source

## 2020-12-22 DIAGNOSIS — I2721 Secondary pulmonary arterial hypertension: Secondary | ICD-10-CM

## 2020-12-24 ENCOUNTER — Other Ambulatory Visit: Payer: Self-pay

## 2020-12-24 DIAGNOSIS — I10 Essential (primary) hypertension: Secondary | ICD-10-CM

## 2020-12-28 ENCOUNTER — Encounter: Payer: Self-pay | Admitting: Student in an Organized Health Care Education/Training Program

## 2020-12-28 ENCOUNTER — Other Ambulatory Visit: Payer: Self-pay | Admitting: Internal Medicine

## 2020-12-28 ENCOUNTER — Other Ambulatory Visit: Payer: Self-pay | Admitting: Nurse Practitioner

## 2020-12-28 ENCOUNTER — Other Ambulatory Visit: Payer: Self-pay

## 2020-12-28 ENCOUNTER — Ambulatory Visit
Payer: No Typology Code available for payment source | Attending: Student in an Organized Health Care Education/Training Program | Admitting: Student in an Organized Health Care Education/Training Program

## 2020-12-28 VITALS — BP 163/70 | HR 88 | Temp 97.9°F | Resp 14 | Ht 69.0 in | Wt 330.0 lb

## 2020-12-28 DIAGNOSIS — M5136 Other intervertebral disc degeneration, lumbar region: Secondary | ICD-10-CM | POA: Diagnosis not present

## 2020-12-28 DIAGNOSIS — G894 Chronic pain syndrome: Secondary | ICD-10-CM | POA: Insufficient documentation

## 2020-12-28 DIAGNOSIS — I10 Essential (primary) hypertension: Secondary | ICD-10-CM

## 2020-12-28 DIAGNOSIS — M47816 Spondylosis without myelopathy or radiculopathy, lumbar region: Secondary | ICD-10-CM | POA: Insufficient documentation

## 2020-12-28 MED ORDER — PREGABALIN 100 MG PO CAPS: 100.0000 mg | ORAL_CAPSULE | Freq: Two times a day (BID) | ORAL | 5 refills | Status: DC

## 2020-12-28 MED ORDER — OXYCODONE-ACETAMINOPHEN 5-325 MG PO TABS: 1.0000 | ORAL_TABLET | Freq: Three times a day (TID) | ORAL | 0 refills | Status: DC | PRN

## 2020-12-28 NOTE — Progress Notes (Signed)
PROVIDER NOTE: Information contained herein reflects review and annotations entered in association with encounter. Interpretation of such information and data should be left to medically-trained personnel. Information provided to patient can be located elsewhere in the medical record under "Patient Instructions". Document created using STT-dictation technology, any transcriptional errors that may result from process are unintentional.    Patient: Candace Clayton  Service Category: E/M  Provider: Gillis Santa, MD  DOB: [birth year]  DOS: 12/28/2020  Specialty: Interventional Pain Management  MRN: [id number]  Setting: Ambulatory outpatient  PCP: Lavera Guise, MD  Type: Established Patient    Referring Provider: Lavera Guise, MD  Location: Office  Delivery: Face-to-face     HPI  Candace Clayton, a 60 y.o. year old female, is here today because of her Lumbar facet arthropathy [M47.816]. [name] primary complain today is Back Pain (Lower, left) Last encounter: My last encounter with her was on 11/15/2020. Pertinent problems: [name] has Major depressive disorder, recurrent episode, moderate ([place]); Morbid obesity ([place]); Low back pain with sciatica; OSA on CPAP; Lumbar degenerative disc disease; Lumbar facet arthropathy (R>L); Lumbar spondylosis; Chronic pain syndrome; and Type 2 diabetes mellitus with hyperglycemia, without long-term current use of insulin (HCC) on their pertinent problem list. Pain Assessment: Severity of Chronic pain is reported as a 5 /10. Location: Back Left, Lower/left leg to the knee. Onset: More than a month ago. Quality: Sharp. Timing: Constant. Modifying factor(s): rest. Vitals:  height is 5' 9"  (1.753 m) and weight is 330 lb (149.7 kg) (abnormal). Her temporal temperature is 97.9 F (36.6 C). Her blood pressure is 163/70 (abnormal) and her pulse is 88. Her respiration is 14 and oxygen saturation is 99%.   Reason for encounter: both, medication management and  post-procedure assessment.      Post-Procedure Evaluation  Procedure (11/15/2020): Right L3, L4, L5 lumbar radiofrequency ablation  Anxiolysis: Please see nurses note.  Effectiveness during initial hour after procedure (Ultra-Short Term Relief): 100 %   Local anesthetic used: Long-acting (4-6 hours) Effectiveness: Defined as any analgesic benefit obtained secondary to the administration of local anesthetics. This carries significant diagnostic value as to the etiological location, or anatomical origin, of the pain. Duration of benefit is expected to coincide with the duration of the local anesthetic used.  Effectiveness during initial 4-6 hours after procedure (Short-Term Relief): 100 %   Long-term benefit: Defined as any relief past the pharmacologic duration of the local anesthetics.  Effectiveness past the initial 6 hours after procedure (Long-Term Relief): 60 %   Benefits, current: Defined as benefit present at the time of this evaluation.   Analgesia:  >50% relief Function: Somewhat improved ROM: Somewhat improved  Now having increased left lower back pain related to lumbar facet arthropathy.  Requesting left-sided lumbar radiofrequency ablation.  Pharmacotherapy Assessment  Analgesic: Percocet 5 mg 3 times daily as needed, quantity 90/month; MME equals 22.5    Monitoring: [place] PMP: PDMP reviewed during this encounter.       Pharmacotherapy: No side-effects or adverse reactions reported. Compliance: No problems identified. Effectiveness: Clinically acceptable.  Landis Martins, RN  12/28/2020 11:18 AM  Sign when Signing Visit Nursing Pain Medication Assessment:  Safety precautions to be maintained throughout the outpatient stay will include: orient to surroundings, keep bed in low position, maintain call bell within reach at all times, provide assistance with transfer out of bed and ambulation.  Medication Inspection Compliance: Ms. Wawrzyniak did not comply with our request to bring  her pills  to be counted. She was reminded that bringing the medication bottles, even when empty, is a requirement.  Medication: None brought in. Pill/Patch Count: None available to be counted. Bottle Appearance: No container available. Did not bring bottle(s) to appointment. Filled Date: N/A Last Medication intake:  Today      UDS:  Summary  Date Value Ref Range Status  08/26/2019 Note  Final    Comment:    ==================================================================== [employer]ox[employer] Select 13 (MW) ==================================================================== Test                             Result       Flag       Units Drug Present and Declared for Prescription Verification   Oxycodone                      139          EXPECTED   ng/mg creat   Oxymorphone                    688          EXPECTED   ng/mg creat   Noroxycodone                   473          EXPECTED   ng/mg creat   Noroxymorphone                 163          EXPECTED   ng/mg creat    Sources of oxycodone are scheduled prescription medications.    Oxymorphone, noroxycodone, and noroxymorphone are expected    metabolites of oxycodone. Oxymorphone is also available as a    scheduled prescription medication. ==================================================================== Test                      Result    Flag   Units      Ref Range   Creatinine              64               mg/dL      >=20 ==================================================================== Declared Medications:  The flagging and interpretation on this report are based on the  following declared medications.  Unexpected results may arise from  inaccuracies in the declared medications.  **Note: The testing scope of this panel includes these medications:  Oxycodone  **Note: The testing scope of this panel does not include the  following reported medications:  Acetaminophen  Albuterol  Budesonide (Symbicort)  Buspirone  Cetirizine   Desvenlafaxine (Pristiq)  Docusate (Stool Softener)  Enalapril  Formoterol (Symbicort)  Hydrochlorothiazide  Hydroxyzine  Multivitamin  Pregabalin  Sulfamethoxazole (Bactrim)  Trimethoprim (Bactrim) ==================================================================== For clinical consultation, please call [phone]. ====================================================================      ROS  Constitutional: Denies any fever or chills Gastrointestinal: No reported hemesis, hematochezia, vomiting, or acute GI distress Musculoskeletal:  Left low back pain, worse with lumbar extension Neurological: No reported episodes of acute onset apraxia, aphasia, dysarthria, agnosia, amnesia, paralysis, loss of coordination, or loss of consciousness  Medication Review  Budeson-Glycopyrrol-Formoterol, Dulaglutide, Microlet Lancets, Multiple Vitamins-Minerals, NON FORMULARY, OVER THE COUNTER MEDICATION, albuterol, amLODipine, busPIRone, cetirizine, desvenlafaxine, enalapril, ergocalciferol, fluticasone, glucose blood, hydrOXYzine, hydrochlorothiazide, melatonin, metFORMIN, metoprolol succinate, metoprolol tartrate, ondansetron, oxyCODONE, oxyCODONE-acetaminophen, oxybutynin, pregabalin, and rosuvastatin  History Review  Allergy: Ms. Doepke has no  active allergies. Drug: Ms. Hanna  reports no history of drug use. Alcohol:  reports no history of alcohol use. Tobacco:  reports that she has never smoked. She has never used smokeless tobacco. Social: Ms. Brecheisen  reports that she has never smoked. She has never used smokeless tobacco. She reports that she does not drink alcohol and does not use drugs. Medical:  has a past medical history of Anginal pain ([place]), Anxiety, Cholecystolithiasis, Depression, Diabetes mellitus without complication ([place]), GERD (gastroesophageal reflux disease), History of C-section (1992), Hypertension, and Sleep apnea. Surgical: Ms. Castleman  has a past surgical history  that includes Cesarean section (1992); Finger surgery (Left, 2011); Hematoma evacuation (Left, 1999); Colonoscopy; Laparoscopic salpingo oophorectomy (Left, 04/02/2018); Colonoscopy with propofol (N/A, 03/14/2019); and Cholecystectomy (10/26/2020). Family: family history includes Brain cancer in her mother; Breast cancer (age of onset: 31) in her mother; Cancer - Colon in her mother; Cancer - Other in her mother; Colon cancer in her brother and mother; Heart Problems in her brother and father.  Laboratory Chemistry Profile   Renal Lab Results  Component Value Date   BUN 16 02/23/2020   CREATININE 0.72 02/23/2020   BCR 22 02/23/2020   GFRAA 106 02/23/2020   GFRNONAA 92 02/23/2020    Hepatic Lab Results  Component Value Date   AST 18 11/18/2020   ALT 24 11/18/2020   ALBUMIN 4.1 11/18/2020   ALKPHOS 120 11/18/2020    Electrolytes Lab Results  Component Value Date   NA 141 02/23/2020   K 5.0 02/23/2020   CL 103 02/23/2020   CALCIUM 9.9 02/23/2020    Bone Lab Results  Component Value Date   VD25OH 12.6 (L) 02/23/2020    Inflammation (CRP: Acute Phase) (ESR: Chronic Phase) No results found for: CRP, ESRSEDRATE, LATICACIDVEN        Note: Above Lab results reviewed.  Recent Imaging Review  MR Abdomen W Wo Contrast CLINICAL DATA:  Evaluate liver cyst post gallbladder removal. Reported 1.8 x 1.8 x 1.9 cm hypoechoic lesion in the right hepatic lobe on ultrasound done on 05/03.  EXAM: MRI ABDOMEN WITHOUT AND WITH CONTRAST  TECHNIQUE: Multiplanar multisequence MR imaging of the abdomen was performed both before and after the administration of intravenous contrast.  CONTRAST:  5m GADAVIST GADOBUTROL 1 MMOL/ML IV SOLN  COMPARISON:  Chest CT 11/11/2020. Abdominal ultrasound 06/12/2018 and abdominal CT 04/03/2018. The referenced recent ultrasound is not available.  FINDINGS: Lower chest:  The visualized lower chest appears unremarkable.  Hepatobiliary: The liver is enlarged  with heterogeneous steatosis. There is heterogeneous loss of signal on the gradient echo opposed phase images. There are no morphologic changes of cirrhosis. Inferiorly in the right hepatic lobe, there is an approximately 2.4 cm nodular area of relatively increased signal on the gradient echo opposed phase images (image 26/9). This area demonstrates no abnormal arterial phase enhancement, although is mildly hyperintense on the delayed post-contrast images, similar to blood pool. In review of the 2019 CT, there is a similar appearing area of relative hyperdensity in this area (image 36/2), favoring focal sparing or an incidental vascular lesion. No suspicious liver lesions are seen. Small gallstones. No gallbladder wall thickening or biliary dilatation.  Pancreas: Unremarkable. No pancreatic ductal dilatation or surrounding inflammatory changes.  Spleen: Normal in size without focal abnormality.  Adrenals/Urinary Tract: Both adrenal glands appear normal. No evidence of renal mass or hydronephrosis.  Stomach/Bowel: The stomach appears unremarkable for its degree of distension. No evidence of bowel wall thickening, distention or surrounding  inflammatory change.  Vascular/Lymphatic: There are no enlarged abdominal lymph nodes. No significant vascular findings.  Other: No ascites.  Musculoskeletal: No acute or significant osseous findings.  IMPRESSION: 1. The liver is enlarged with heterogeneous hepatic steatosis. A nodular area of relatively increased signal on out of phase gradient echo imaging and delayed contrast enhancement appears similar to previous CT from 2019 and is favored to reflect focal sparing of steatosis or an incidental vascular lesion. This may correspond with the reported ultrasound finding. No other focal hepatic lesions are identified, and there are no morphologic changes of cirrhosis. 2. Cholelithiasis without evidence of cholecystitis or  biliary dilatation. 3. No acute abdominal findings.  Electronically Signed   By: Richardean Sale M.D.   On: 12/03/2020 15:12 Note: Reviewed        Physical Exam  General appearance: Well nourished, well developed, and well hydrated. In no apparent acute distress Mental status: Alert, oriented x 3 (person, place, & time)       Respiratory: No evidence of acute respiratory distress Eyes: PERLA Vitals: BP (!) 163/70   Pulse 88   Temp 97.9 F (36.6 C) (Temporal)   Resp 14   Ht 5' 9"  (1.753 m)   Wt (!) 330 lb (149.7 kg)   SpO2 99%   BMI 48.73 kg/m  BMI: Estimated body mass index is 48.73 kg/m as calculated from the following:   Height as of this encounter: 5' 9"  (1.753 m).   Weight as of this encounter: 330 lb (149.7 kg). Ideal: Ideal body weight: 66.2 kg (145 lb 15.1 oz) Adjusted ideal body weight: 99.6 kg (219 lb 9.1 oz)  Lumbar Spine Area Exam  Skin & Axial Inspection: No masses, redness, or swelling Alignment: Symmetrical Functional ROM: Limited  Stability: No instability detected Muscle Tone/Strength: Functionally intact. No obvious neuro-muscular anomalies detected. Sensory (Neurological): facetogenic Palpation: No palpable anomalies       Provocative Tests: Hyperextension/rotation test: (+) bilaterally for facet joint pain. L>R, right side doing better after RFA Lumbar quadrant test (Kemp's test): deferred today       Lateral bending test: (+) due to pain.,     Gait & Posture Assessment  Ambulation: Unassisted Gait: Relatively normal for age and body habitus Posture: WNL    Lower Extremity Exam      Side: Right lower extremity   Side: Left lower extremity  Stability: No instability observed           Stability: No instability observed          Skin & Extremity Inspection: Skin color, temperature, and hair growth are WNL. No peripheral edema or cyanosis. No masses, redness, swelling, asymmetry, or associated skin lesions. No contractures.   Skin & Extremity  Inspection: Skin color, temperature, and hair growth are WNL. No peripheral edema or cyanosis. No masses, redness, swelling, asymmetry, or associated skin lesions. No contractures.  Functional ROM: Unrestricted ROM                   Functional ROM: Unrestricted ROM                  Muscle Tone/Strength: Functionally intact. No obvious neuro-muscular anomalies detected.   Muscle Tone/Strength: Functionally intact. No obvious neuro-muscular anomalies detected.  Sensory (Neurological): Unimpaired         Sensory (Neurological): Unimpaired        DTR: Patellar: deferred today Achilles: deferred today Plantar: deferred today   DTR: Patellar: deferred today Achilles: deferred today Plantar:  deferred today  Palpation: No palpable anomalies   Palpation: No palpable anomalies    Assessment   Status Diagnosis  Responding Responding Controlled 1. Lumbar facet arthropathy    2. Lumbar spondylosis   3. Lumbar degenerative disc disease   4. Chronic pain syndrome      Updated Problems: No problems updated.  Plan of Care   Ms. SAMAI COREA has a current medication list which includes the following long-term medication(s): albuterol, amlodipine, cetirizine, desvenlafaxine, enalapril, fluticasone, hydrochlorothiazide, metformin, metoprolol succinate, rosuvastatin, [START ON 01/04/2021] oxycodone-acetaminophen, [START ON 02/03/2021] oxycodone-acetaminophen, [START ON 03/05/2021] oxycodone-acetaminophen, pregabalin, and [DISCONTINUED] metoprolol tartrate.  Pharmacotherapy (Medications Ordered): Meds ordered this encounter  Medications   oxyCODONE-acetaminophen (PERCOCET/ROXICET) 5-325 MG tablet    Sig: Take 1 tablet by mouth every 8 (eight) hours as needed for severe pain.    Dispense:  90 tablet    Refill:  0    For chronic pain syndrome. To last for 30 days from fill date.   oxyCODONE-acetaminophen (PERCOCET/ROXICET) 5-325 MG tablet    Sig: Take 1 tablet by mouth every 8 (eight) hours as  needed for severe pain.    Dispense:  90 tablet    Refill:  0    For chronic pain syndrome. To last for 30 days from fill date.   oxyCODONE-acetaminophen (PERCOCET/ROXICET) 5-325 MG tablet    Sig: Take 1 tablet by mouth every 8 (eight) hours as needed for severe pain.    Dispense:  90 tablet    Refill:  0    For chronic pain syndrome. To last for 30 days from fill date.   pregabalin (LYRICA) 100 MG capsule    Sig: Take 1 capsule (100 mg total) by mouth 2 (two) times daily. 100 mg qAM, 100-200 mg qhs    Dispense:  60 capsule    Refill:  5    Fill one day early if pharmacy is closed on scheduled refill date. May substitute for generic if available.   Orders:  Orders Placed This Encounter  Procedures   Tox[employer]SU[employer] Select 13 (MW), Urine    Volume: 30 ml(s). Minimum 3 ml of urine is needed. Document temperature of fresh sample. Indications: Long term (current) use of opiate analgesic (E45.409)    Order Specific Question:   Release to patient    Answer:   Immediate    Follow-up for left L3, L4, L5 lumbar radiofrequency ablation.  Previously done 05/03/2020 which provided 75% pain relief for almost 6 months.  Order is already in place needs to be released by front staff.  Follow-up plan:   Return in about 2 weeks (around 01/11/2021) for Left L3,4,5 RFA with sedation (order in place it ,needs to be released).     Status post diagnostic bilateral L3, L4, L5, S1 facet medial branch nerve blocks, status post right L3, L4, L5, S1 RFA on 05/05/2019, 04/15/20; left L3, L4, L5 RFA 05/03/2020.        Recent Visits Date Type Provider Dept  11/15/20 Procedure visit Gillis Santa, MD [facility]  09/30/20 Office Visit Gillis Santa, MD [facility]  Showing recent visits within past 90 days and meeting all other requirements Today's Visits Date Type Provider Dept  12/28/20 Office Visit Gillis Santa, MD [facility]  Showing today's visits and meeting all other  requirements Future Appointments No visits were found meeting these conditions. Showing future appointments within next 90 days and meeting all other requirements I discussed the assessment and treatment plan  with the patient. The patient was provided an opportunity to ask questions and all were answered. The patient agreed with the plan and demonstrated an understanding of the instructions.  Patient advised to call back or seek an in-person evaluation if the symptoms or condition worsens.  Duration of encounter: 30 minutes.  Note by: Gillis Santa, MD Date: 12/28/2020; Time: 11:55 AM

## 2020-12-28 NOTE — Progress Notes (Signed)
Nursing Pain Medication Assessment:  Safety precautions to be maintained throughout the outpatient stay will include: orient to surroundings, keep bed in low position, maintain call bell within reach at all times, provide assistance with transfer out of bed and ambulation.  Medication Inspection Compliance: Candace Clayton did not comply with our request to bring her pills to be counted. She was reminded that bringing the medication bottles, even when empty, is a requirement.  Medication: None brought in. Pill/Patch Count: None available to be counted. Bottle Appearance: No container available. Did not bring bottle(s) to appointment. Filled Date: N/A Last Medication intake:  Today

## 2021-01-01 LAB — TOXASSURE SELECT 13 (MW), URINE

## 2021-01-04 ENCOUNTER — Telehealth: Payer: Self-pay

## 2021-01-04 NOTE — Telephone Encounter (Signed)
Left vm to screen for 01/05/21 appointment-Toni 

## 2021-01-05 ENCOUNTER — Encounter: Payer: Self-pay | Admitting: Nurse Practitioner

## 2021-01-05 ENCOUNTER — Other Ambulatory Visit: Payer: Self-pay

## 2021-01-05 ENCOUNTER — Ambulatory Visit: Payer: No Typology Code available for payment source | Admitting: Nurse Practitioner

## 2021-01-05 VITALS — BP 138/82 | HR 72 | Temp 98.3°F | Resp 16 | Ht 69.0 in | Wt 338.4 lb

## 2021-01-05 DIAGNOSIS — R160 Hepatomegaly, not elsewhere classified: Secondary | ICD-10-CM

## 2021-01-05 DIAGNOSIS — G4733 Obstructive sleep apnea (adult) (pediatric): Secondary | ICD-10-CM | POA: Diagnosis not present

## 2021-01-05 DIAGNOSIS — E782 Mixed hyperlipidemia: Secondary | ICD-10-CM

## 2021-01-05 DIAGNOSIS — E1165 Type 2 diabetes mellitus with hyperglycemia: Secondary | ICD-10-CM | POA: Diagnosis not present

## 2021-01-05 DIAGNOSIS — I10 Essential (primary) hypertension: Secondary | ICD-10-CM | POA: Diagnosis not present

## 2021-01-05 DIAGNOSIS — F411 Generalized anxiety disorder: Secondary | ICD-10-CM

## 2021-01-05 DIAGNOSIS — J449 Chronic obstructive pulmonary disease, unspecified: Secondary | ICD-10-CM

## 2021-01-05 DIAGNOSIS — Z9989 Dependence on other enabling machines and devices: Secondary | ICD-10-CM

## 2021-01-05 MED ORDER — AMLODIPINE BESYLATE 5 MG PO TABS: 5.0000 mg | ORAL_TABLET | Freq: Every day | ORAL | 1 refills | Status: DC

## 2021-01-05 MED ORDER — BUSPIRONE HCL 15 MG PO TABS: 15.0000 mg | ORAL_TABLET | Freq: Three times a day (TID) | ORAL | 3 refills | Status: DC

## 2021-01-05 MED ORDER — ROSUVASTATIN CALCIUM 5 MG PO TABS: 5.0000 mg | ORAL_TABLET | Freq: Every day | ORAL | 1 refills | Status: DC

## 2021-01-05 MED ORDER — METOPROLOL SUCCINATE ER 100 MG PO TB24: 100.0000 mg | ORAL_TABLET | Freq: Every day | ORAL | 1 refills | Status: DC

## 2021-01-05 MED ORDER — HYDROCHLOROTHIAZIDE 25 MG PO TABS: 25.0000 mg | ORAL_TABLET | Freq: Every day | ORAL | 3 refills | Status: DC

## 2021-01-05 NOTE — Progress Notes (Signed)
[facility] [place],  [address]  Internal MEDICINE  Office Visit Note  Patient Name: Candace Clayton  [id number]  [id number]  Date of Service: 01/05/2021  Chief Complaint  Patient presents with  . Follow-up    Med follow up  . Diabetes  . COPD    HPI Candace Clayton presents for follow up visit to discuss test results and medication review. She has diabetes and COPD, anxiety, hypertension, depression, GERD and sleep apnea. The MRI of her abdomen only showed hepatic steatosis and cholelithiasis without obstruction. This was discussed with Candace Clayton during her visit today.   Echocardiogram was done recently on 12/23/20 and the results showed impaired relaxation during diastolic filling, mild left atrial dilation, moderate aortic valve sclerosis, and LVEF of 76%. Repeat in 3 years or as needed.  -reports that her breathing has not changed much with the breztri inhaler. She states that it is not worse but it is not noticeably improved.  -Candace Clayton was having difficulty knowing which blood pressure medications to take. She was taking hydochlorothiazide and enalapril. At her most recent office visit, she was started on amlodipine and her metoprolol was switched to extended release.     Current Medication: Outpatient Encounter Medications as of 01/05/2021  Medication Sig  . Budeson-Glycopyrrol-Formoterol (BREZTRI AEROSPHERE) 160-9-4.8 MCG/ACT AERO Inhale 2 puffs into the lungs 2 (two) times daily.  . cetirizine (ZYRTEC) 10 MG tablet TAKE 1 TABLET(S) BY MOUTH DAILY FOR ALLERGIES  . desvenlafaxine (PRISTIQ) 100 MG 24 hr tablet Take 1 tablet (100 mg total) by mouth daily.  . Dulaglutide 0.75 MG/0.5ML SOPN Inject 0.75 mg into the skin once a week.  . ergocalciferol (DRISDOL) 1.25 MG (50000 UT) capsule Take 1 capsule (50,000 Units total) by mouth once a week.  . fluticasone (FLONASE) 50 MCG/ACT nasal spray Place 2 sprays into both nostrils daily.  Marland Kitchen glucose blood (CONTOUR NEXT TEST)  test strip 1 each by Other route daily. DX E11.65  . hydrOXYzine (ATARAX/VISTARIL) 10 MG tablet Take 1 tablet (10 mg total) by mouth 3 (three) times daily as needed.  . melatonin 3 MG TABS tablet Take by mouth.  . Microlet Lancets MISC 1 each by Does not apply route daily. DX-E11.65  . Multiple Vitamins-Calcium (ONE-A-DAY WOMENS PO) Take 1 tablet by mouth daily.  . NON FORMULARY cpap device  . ondansetron (ZOFRAN-ODT) 4 MG disintegrating tablet Take by mouth.  Marland Kitchen OVER THE COUNTER MEDICATION 1 tablet daily as needed. Takes generic stool softener for constipation d/t oxycodone.   Marland Kitchen oxybutynin (DITROPAN-XL) 10 MG 24 hr tablet TAKE 1 TABLET BY MOUTH EVERYDAY AT BEDTIME  . oxyCODONE (OXY IR/ROXICODONE) 5 MG immediate release tablet Take by mouth.  Derrill Memo ON 03/05/2021] oxyCODONE-acetaminophen (PERCOCET/ROXICET) 5-325 MG tablet Take 1 tablet by mouth every 8 (eight) hours as needed for severe pain.  . pregabalin (LYRICA) 100 MG capsule Take 1 capsule (100 mg total) by mouth 2 (two) times daily. 100 mg qAM, 100-200 mg qhs  . [DISCONTINUED] busPIRone (BUSPAR) 15 MG tablet Take 1 tablet (15 mg total) by mouth 3 (three) times daily. Take on tablet TID for anxiety.  . [DISCONTINUED] enalapril (VASOTEC) 20 MG tablet Take 1 tablet (20 mg total) by mouth 2 (two) times daily.  . [DISCONTINUED] hydrochlorothiazide (HYDRODIURIL) 25 MG tablet Take 1 tablet (25 mg total) by mouth daily.  . [DISCONTINUED] rosuvastatin (CRESTOR) 5 MG tablet Take 1 tablet (5 mg total) by mouth daily.  Marland Kitchen albuterol (VENTOLIN HFA) 108 (90 Base)  MCG/ACT inhaler TAKE 2 PUFFS BY MOUTH EVERY 6 HOURS AS NEEDED FOR WHEEZE OR SHORTNESS OF BREATH (Patient not taking: Reported on 01/05/2021)  . amLODipine (NORVASC) 5 MG tablet Take 1 tablet (5 mg total) by mouth daily.  . busPIRone (BUSPAR) 15 MG tablet Take 1 tablet (15 mg total) by mouth 3 (three) times daily. Take on tablet TID for anxiety.  . hydrochlorothiazide (HYDRODIURIL) 25 MG tablet Take  1 tablet (25 mg total) by mouth daily.  . metoprolol succinate (TOPROL-XL) 100 MG 24 hr tablet Take 1 tablet (100 mg total) by mouth daily. TAKE WITH OR IMMEDIATELY FOLLOWING A MEAL.  . rosuvastatin (CRESTOR) 5 MG tablet Take 1 tablet (5 mg total) by mouth daily.  . [DISCONTINUED] amLODipine (NORVASC) 5 MG tablet TAKE 1 TABLET (5 MG TOTAL) BY MOUTH DAILY. (Patient not taking: Reported on 01/05/2021)  . [DISCONTINUED] metFORMIN (GLUCOPHAGE) 500 MG tablet Take 1 tablet (500 mg total) by mouth 2 (two) times daily with a meal. (Patient not taking: Reported on 01/05/2021)  . [DISCONTINUED] metoprolol succinate (TOPROL-XL) 100 MG 24 hr tablet TAKE 1 TABLET BY MOUTH DAILY. TAKE WITH OR IMMEDIATELY FOLLOWING A MEAL. (Patient not taking: Reported on 01/05/2021)  . [DISCONTINUED] metoprolol tartrate (LOPRESSOR) 25 MG tablet Take 2 tablets (50 mg total) by mouth 2 (two) times daily.  . [DISCONTINUED] oxyCODONE-acetaminophen (PERCOCET/ROXICET) 5-325 MG tablet Take 1 tablet by mouth every 8 (eight) hours as needed for severe pain. (Patient not taking: Reported on 01/05/2021)  . [DISCONTINUED] oxyCODONE-acetaminophen (PERCOCET/ROXICET) 5-325 MG tablet Take 1 tablet by mouth every 8 (eight) hours as needed for severe pain. (Patient not taking: Reported on 01/05/2021)   No facility-administered encounter medications on file as of 01/05/2021.    Surgical History: Past Surgical History:  Procedure Laterality Date  . CESAREAN SECTION  1992  . CHOLECYSTECTOMY  10/26/2020  . COLONOSCOPY     polyps removed first procedure. 2nd time all was clear  . COLONOSCOPY WITH PROPOFOL N/A 03/14/2019   Procedure: COLONOSCOPY WITH PROPOFOL;  Surgeon: Lin Landsman, MD;  Location: [facility] ENDOSCOPY;  Service: Gastroenterology;  Laterality: N/A;  . FINGER SURGERY Left 2011   left finger cut off x 2.(only up to last digit)  . HEMATOMA EVACUATION Left 1999   upper part of foot was injured d/t 500lb weight landing on her foot.   Marland Kitchen  LAPAROSCOPIC SALPINGO OOPHERECTOMY Left 04/02/2018   Procedure: LAPAROSCOPIC SALPINGO OOPHORECTOMY;  Surgeon: Malachy Mood, MD;  Location: [facility] ORS;  Service: Gynecology;  Laterality: Left;    Medical History: Past Medical History:  Diagnosis Date  . Anginal pain (HCC)    tightness related to anxiety  . Anxiety   . Cholecystolithiasis   . Depression   . Diabetes mellitus without complication ([place])   . GERD (gastroesophageal reflux disease)    throws up easily but not diagnosed with reflux  . History of C-section 29  . Hypertension   . Sleep apnea    uses cpap    Family History: Family History  Problem Relation Age of Onset  . Breast cancer Mother 40       x 3 times  . Colon cancer Mother   . Brain cancer Mother   . Cancer - Colon Mother   . Cancer - Other Mother   . Heart Problems Father   . Colon cancer Brother   . Heart Problems Brother     Social History   Socioeconomic History  . Marital status: Divorced    Spouse  name: Not on file  . Number of children: 1  . Years of education: Not on file  . Highest education level: Not on file  Occupational History  . Occupation: works in Secretary/administrator  Tobacco Use  . Smoking status: Never  . Smokeless tobacco: Never  Vaping Use  . Vaping Use: Never used  Substance and Sexual Activity  . Alcohol use: No  . Drug use: No  . Sexual activity: Not Currently    Birth control/protection: Post-menopausal  Other Topics Concern  . Not on file  Social History Narrative   Son has schizophrenia and autism but is fully capable of helping mother after surgery   Social Determinants of Health   Financial Resource Strain: Not on file  Food Insecurity: Not on file  Transportation Needs: Not on file  Physical Activity: Not on file  Stress: Not on file  Social Connections: Not on file  Intimate Partner Violence: Not on file      Review of Systems  Constitutional:  Negative for chills, fatigue and unexpected weight  change.  HENT:  Negative for congestion, rhinorrhea, sneezing and sore throat.   Eyes:  Negative for redness.  Respiratory:  Positive for cough, shortness of breath and wheezing. Negative for chest tightness.   Cardiovascular: Negative.  Negative for chest pain and palpitations.  Gastrointestinal:  Negative for abdominal pain, constipation, diarrhea, nausea and vomiting.  Genitourinary:  Negative for dysuria and frequency.  Musculoskeletal:  Negative for arthralgias, back pain, joint swelling and neck pain.  Skin:  Negative for rash.  Neurological: Negative.  Negative for tremors and numbness.  Hematological:  Negative for adenopathy. Does not bruise/bleed easily.  Psychiatric/Behavioral:  Negative for behavioral problems (Depression), sleep disturbance and suicidal ideas. The patient is not nervous/anxious.    Vital Signs: BP 138/82   Pulse 72   Temp 98.3 F (36.8 C)   Resp 16   Ht 5\' 9"  (1.753 m)   Wt (!) 338 lb 6.4 oz (153.5 kg)   SpO2 96%   BMI 49.97 kg/m    Physical Exam Vitals reviewed.  Constitutional:      General: She is not in acute distress.    Appearance: Normal appearance. She is well-developed. She is obese. She is not ill-appearing or diaphoretic.  HENT:     Head: Normocephalic and atraumatic.  Neck:     Thyroid: No thyromegaly.     Vascular: No JVD.     Trachea: No tracheal deviation.  Cardiovascular:     Rate and Rhythm: Normal rate and regular rhythm.     Pulses: Normal pulses.     Heart sounds: Normal heart sounds. No murmur heard.   No friction rub. No gallop.  Pulmonary:     Effort: Pulmonary effort is normal. No respiratory distress.     Breath sounds: Normal breath sounds. No wheezing or rales.  Chest:     Chest wall: No tenderness.  Skin:    General: Skin is warm and dry.     Capillary Refill: Capillary refill takes less than 2 seconds.  Neurological:     Mental Status: She is alert and oriented to person, place, and time.  Psychiatric:         Mood and Affect: Mood normal.        Behavior: Behavior normal.     Assessment/Plan: 1. Essential hypertension Reviewed blood pressure medications with Candace Clayton. She is to take metoprolol succinate, hydrochlorothiazide and amlodipine. Enalapril discontinued. Will follow up in 4 weeks  to evaluate effectiveness of medications. Will add enalapril back if needed.  - metoprolol succinate (TOPROL-XL) 100 MG 24 hr tablet; Take 1 tablet (100 mg total) by mouth daily. TAKE WITH OR IMMEDIATELY FOLLOWING A MEAL.  Dispense: 30 tablet; Refill: 1 - hydrochlorothiazide (HYDRODIURIL) 25 MG tablet; Take 1 tablet (25 mg total) by mouth daily.  Dispense: 90 tablet; Refill: 3 - amLODipine (NORVASC) 5 MG tablet; Take 1 tablet (5 mg total) by mouth daily.  Dispense: 90 tablet; Refill: 1  2. COPD without exacerbation ([place]) Stable per patient, not worsening, using Breztri maintenance inhaler.   3. Type 2 diabetes mellitus with hyperglycemia, without long-term current use of insulin ([place]) Using trulicity. She has stopped taking metformin due to the GI side effects. Kidney function is normal. Last A1Cwas 8.5 in June 2022. Will consider starting patient on farxiga or jardiance as adjunct therapy, will recheck A1C in September.   4. OSA on CPAP Using CPAP at night which has been helping her sleep apnea per patient report.   5. GAD (generalized anxiety disorder) Anxiety managed with buspirone and desvenlafaxine, buspirone refill ordered.  - busPIRone (BUSPAR) 15 MG tablet; Take 1 tablet (15 mg total) by mouth 3 (three) times daily. Take on tablet TID for anxiety.  Dispense: 90 tablet; Refill: 3  6. Hepatomegaly CT scan, ultrasound and MRI of abdomen have been done. Will continue to monitor periodically   7. Mass of right lobe of liver Asymptomatic, and unchanged according to imaging, will continue to monitor periodically   8. Mixed hyperlipidemia Elevated lipid panel from august 2021. Taking rosuvastatin 5 mg  daily. Need to recheck fasting lipid panel.  - rosuvastatin (CRESTOR) 5 MG tablet; Take 1 tablet (5 mg total) by mouth daily.  Dispense: 90 tablet; Refill: 1 -Lipid profile  General Counseling: Candace Clayton verbalizes understanding of the findings of todays visit and agrees with plan of treatment. I have discussed any further diagnostic evaluation that may be needed or ordered today. We also reviewed her medications today. she has been encouraged to call the office with any questions or concerns that should arise related to todays visit.    Orders Placed This Encounter  Procedures  . Lipid Profile     Meds ordered this encounter  Medications  . busPIRone (BUSPAR) 15 MG tablet    Sig: Take 1 tablet (15 mg total) by mouth 3 (three) times daily. Take on tablet TID for anxiety.    Dispense:  90 tablet    Refill:  3  . rosuvastatin (CRESTOR) 5 MG tablet    Sig: Take 1 tablet (5 mg total) by mouth daily.    Dispense:  90 tablet    Refill:  1  . metoprolol succinate (TOPROL-XL) 100 MG 24 hr tablet    Sig: Take 1 tablet (100 mg total) by mouth daily. TAKE WITH OR IMMEDIATELY FOLLOWING A MEAL.    Dispense:  30 tablet    Refill:  1  . hydrochlorothiazide (HYDRODIURIL) 25 MG tablet    Sig: Take 1 tablet (25 mg total) by mouth daily.    Dispense:  90 tablet    Refill:  3  . amLODipine (NORVASC) 5 MG tablet    Sig: Take 1 tablet (5 mg total) by mouth daily.    Dispense:  90 tablet    Refill:  1    Return in about 1 month (around 02/05/2021) for F/U, BP check, Bring all medications, Candace Clayton PCP.   Total time spent:30 Minutes Time spent includes  review of chart, medications, test results, and follow up plan with the patient.   [place] Controlled Substance Database was reviewed by me.  This patient was seen by Jonetta Osgood, FNP-C in collaboration with Dr. Clayborn Bigness as a part of collaborative care agreement.   Vivan Vanderveer R. Valetta Fuller, MSN, FNP-C Internal medicine

## 2021-01-10 ENCOUNTER — Telehealth: Payer: Self-pay

## 2021-01-10 NOTE — Telephone Encounter (Signed)
Unable to complete PA at this time, internet down.

## 2021-01-10 NOTE — Telephone Encounter (Signed)
Was this able to be completed??

## 2021-01-10 NOTE — Telephone Encounter (Signed)
Attempted to complete PA on CMM.  No PA needed.

## 2021-01-10 NOTE — Telephone Encounter (Signed)
Pt needs prior auth pregabalin 100mg 

## 2021-01-26 ENCOUNTER — Telehealth: Payer: Self-pay

## 2021-01-26 NOTE — Telephone Encounter (Signed)
Called patient to inform her that she needs to have a fasting lipid panel drawn prior to her next appointment on 02/03/21. Labs already ordered the lab test and sent it to [place].

## 2021-02-01 ENCOUNTER — Telehealth: Payer: Self-pay

## 2021-02-01 NOTE — Telephone Encounter (Signed)
Patient called wanting to check on appt time Thursday but stated she had covid. I advised we would have to schedule a virtual appt for her due to her positive covid test taken on Sunday 01/30/2021. She stated that she will be going to the ER instead as she is in really bad pain.

## 2021-02-03 ENCOUNTER — Ambulatory Visit: Payer: No Typology Code available for payment source | Admitting: Nurse Practitioner

## 2021-02-03 ENCOUNTER — Other Ambulatory Visit: Payer: Self-pay

## 2021-02-03 ENCOUNTER — Ambulatory Visit
Admission: EM | Admit: 2021-02-03 | Discharge: 2021-02-03 | Disposition: A | Discharge status: Home / Self Care | Payer: No Typology Code available for payment source | Attending: Medical Oncology | Admitting: Medical Oncology

## 2021-02-03 ENCOUNTER — Encounter: Payer: Self-pay | Admitting: Emergency Medicine

## 2021-02-03 DIAGNOSIS — H60501 Unspecified acute noninfective otitis externa, right ear: Secondary | ICD-10-CM

## 2021-02-03 DIAGNOSIS — R591 Generalized enlarged lymph nodes: Secondary | ICD-10-CM | POA: Diagnosis not present

## 2021-02-03 MED ORDER — NEOMYCIN-POLYMYXIN-HC 3.5-10000-1 OT SUSP: 4.0000 [drp] | Freq: Three times a day (TID) | OTIC | 0 refills | Status: DC

## 2021-02-03 NOTE — ED Provider Notes (Signed)
[facility]    CSN: [id number] Arrival date & time: 02/03/21  1226      History   Chief Complaint Chief Complaint  Patient presents with   Otalgia    right   Covid Positive    01/28/21    HPI CONESHA ANTONIOU is a 60 y.o. female.   HPI  Otalgia: Patient reports that for about a week she has had right ear pain.  The ear pain hurts most when she tries to rest on the ear or feels behind her ear.  Rated about a 3 out of 10 in nature.  She has used Aleve for symptoms with improvement.  She denies any recent fevers, ear discharge, ear trauma or loss of hearing.  Of note she was recently diagnosed with COVID-19.  She reports no shortness of breath or chest pain.  She asked for a work note for the next 3 days as she is due to return to work for 12-hour shifts does not feel that she is quite ready to return yet.  Past Medical History:  Diagnosis Date   Anginal pain ([place])    tightness related to anxiety   Anxiety    Cholecystolithiasis    Depression    Diabetes mellitus without complication (HCC)    GERD (gastroesophageal reflux disease)    throws up easily but not diagnosed with reflux   History of C-section 1992   Hypertension    Sleep apnea    uses cpap    Patient Active Problem List   Diagnosis Date Noted   Acute gallstone pancreatitis 10/27/2020   Acute kidney injury ([place]) 10/27/2020   Encounter for general adult medical examination with abnormal findings 05/17/2020   Acute non-recurrent pansinusitis 05/17/2020   Vasomotor rhinitis 05/17/2020   Mixed hyperlipidemia 05/17/2020   Encounter for screening mammogram for malignant neoplasm of breast 05/17/2020   Arthritis of right knee 04/01/2020   Pneumonia of both lower lobes due to infectious organism 02/18/2020   Chest pain 02/13/2020   Shortness of breath 02/11/2020   Multiple closed fractures of ribs of right side 02/11/2020   Acute calculous cholecystitis 02/11/2020   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  01/27/2020   Multiple closed fractures of ribs of left side 01/27/2020   Overactive bladder 12/28/2019   Dysuria 10/05/2019   Type 2 diabetes mellitus with hyperglycemia, without long-term current use of insulin ([place]) 10/05/2019   Abnormal fasting glucose 10/05/2019   Bladder spasm 06/02/2019   Family history of colon cancer in mother    Right lower quadrant pain 03/12/2019   History of ovarian cyst 03/12/2019   Chronic pain syndrome 03/05/2019   Seasonal allergic rhinitis due to pollen 08/26/2018   Lumbar facet arthropathy (R>L) 08/22/2018   Lumbar spondylosis 08/22/2018   Left ovarian cyst 03/03/2018   Lumbar degenerative disc disease 12/23/2017   Encounter for screening colonoscopy 10/24/2017   Low back pain with sciatica 07/03/2017   Allergic rhinitis, unspecified 07/03/2017   Pain in unspecified knee 07/03/2017   OSA on CPAP 07/03/2017   Sedative, hypnotic, or anxiolytic use, unspecified, uncomplicated [id number]   Hypersomnia 06/27/2017   Major depressive disorder, recurrent episode, moderate ([place]) 06/27/2017   Essential hypertension 06/27/2017   Morbid obesity ([place]) 06/27/2017   GAD (generalized anxiety disorder) 06/27/2017   Urinary tract infection 06/27/2017    Past Surgical History:  Procedure Laterality Date   CESAREAN SECTION  1992   CHOLECYSTECTOMY  10/26/2020   COLONOSCOPY  polyps removed first procedure. 2nd time all was clear   COLONOSCOPY WITH PROPOFOL N/A 03/14/2019   Procedure: COLONOSCOPY WITH PROPOFOL;  Surgeon: Lin Landsman, MD;  Location[facility];  Service: Gastroenterology;  Laterality: N/A;   FINGER SURGERY Left 2011   left finger cut off x 2.(only up to last digit)   HEMATOMA EVACUATION Left 1999   upper part of foot was injured d/t 500lb weight landing on her foot.    LAPAROSCOPIC SALPINGO OOPHERECTOMY Left 04/02/2018   Procedure: LAPAROSCOPIC SALPINGO OOPHORECTOMY;  Surgeon: Malachy Mood, MD;  Location: [facility] ORS;   Service: Gynecology;  Laterality: Left;    OB History     Gravida  2   Para  2   Term  2   Preterm      AB      Living         SAB      IAB      Ectopic      Multiple      Live Births  2            Home Medications    Prior to Admission medications   Medication Sig Start Date End Date Taking? Authorizing Provider  albuterol (VENTOLIN HFA) 108 (90 Base) MCG/ACT inhaler TAKE 2 PUFFS BY MOUTH EVERY 6 HOURS AS NEEDED FOR WHEEZE OR SHORTNESS OF BREATH 03/12/20  Yes Boscia, Heather E, NP  amLODipine (NORVASC) 5 MG tablet Take 1 tablet (5 mg total) by mouth daily. 01/05/21  Yes Abernathy, Yetta Flock, NP  Budeson-Glycopyrrol-Formoterol (BREZTRI AEROSPHERE) 160-9-4.8 MCG/ACT AERO Inhale 2 puffs into the lungs 2 (two) times daily. 11/28/20  Yes Abernathy, Alyssa, NP  busPIRone (BUSPAR) 15 MG tablet Take 1 tablet (15 mg total) by mouth 3 (three) times daily. Take on tablet TID for anxiety. 01/05/21  Yes Abernathy, Yetta Flock, NP  cetirizine (ZYRTEC) 10 MG tablet TAKE 1 TABLET(S) BY MOUTH DAILY FOR ALLERGIES 06/28/20  Yes Boscia, Heather E, NP  desvenlafaxine (PRISTIQ) 100 MG 24 hr tablet Take 1 tablet (100 mg total) by mouth daily. 11/15/20  Yes Abernathy, Yetta Flock, NP  Dulaglutide 0.75 MG/0.5ML SOPN Inject 0.75 mg into the skin once a week. 11/28/20  Yes Abernathy, Yetta Flock, NP  ergocalciferol (DRISDOL) 1.25 MG (50000 UT) capsule Take 1 capsule (50,000 Units total) by mouth once a week. 11/15/20  Yes Abernathy, Yetta Flock, NP  fluticasone (FLONASE) 50 MCG/ACT nasal spray Place 2 sprays into both nostrils daily. 05/17/20  Yes Boscia, Heather E, NP  glucose blood (CONTOUR NEXT TEST) test strip 1 each by Other route daily. DX E11.65 07/26/20  Yes Luiz Ochoa, NP  hydrochlorothiazide (HYDRODIURIL) 25 MG tablet Take 1 tablet (25 mg total) by mouth daily. 01/05/21  Yes Abernathy, Yetta Flock, NP  hydrOXYzine (ATARAX/VISTARIL) 10 MG tablet Take 1 tablet (10 mg total) by mouth 3 (three) times daily as needed.  07/15/18  Yes Allyne Gee, MD  melatonin 3 MG TABS tablet Take by mouth. 10/29/20  Yes [provider]  metoprolol succinate (TOPROL-XL) 100 MG 24 hr tablet Take 1 tablet (100 mg total) by mouth daily. TAKE WITH OR IMMEDIATELY FOLLOWING A MEAL. 01/05/21  Yes Abernathy, Yetta Flock, NP  Microlet Lancets MISC 1 each by Does not apply route daily. DX-E11.65 09/22/19  Yes Ronnell Freshwater, NP  Multiple Vitamins-Calcium (ONE-A-DAY WOMENS PO) Take 1 tablet by mouth daily.   Yes [provider]  NON FORMULARY cpap device   Yes [provider]  ondansetron (ZOFRAN-ODT) 4 MG disintegrating  tablet Take by mouth. 10/29/20  Yes [provider]  OVER THE COUNTER MEDICATION 1 tablet daily as needed. Takes generic stool softener for constipation d/t oxycodone.    Yes [provider]  oxybutynin (DITROPAN-XL) 10 MG 24 hr tablet TAKE 1 TABLET BY MOUTH EVERYDAY AT BEDTIME 07/15/20  Yes [provider]  oxyCODONE (OXY IR/ROXICODONE) 5 MG immediate release tablet Take by mouth. 10/29/20  Yes [provider]  oxyCODONE-acetaminophen (PERCOCET/ROXICET) 5-325 MG tablet Take 1 tablet by mouth every 8 (eight) hours as needed for severe pain. 03/05/21 04/04/21 Yes Gillis Santa, MD  pregabalin (LYRICA) 100 MG capsule Take 1 capsule (100 mg total) by mouth 2 (two) times daily. 100 mg qAM, 100-200 mg qhs 12/28/20  Yes Lateef, Bilal, MD  rosuvastatin (CRESTOR) 5 MG tablet Take 1 tablet (5 mg total) by mouth daily. 01/05/21  Yes Abernathy, Yetta Flock, NP  metoprolol tartrate (LOPRESSOR) 25 MG tablet Take 2 tablets (50 mg total) by mouth 2 (two) times daily. 08/24/20 11/01/20  Luiz Ochoa, NP    Family History Family History  Problem Relation Age of Onset   Breast cancer Mother 44       x 3 times   Colon cancer Mother    Brain cancer Mother    Cancer - Colon Mother    Cancer - Other Mother    Heart Problems Father    Colon cancer Brother    Heart Problems Brother      Social History Social History   Tobacco Use   Smoking status: Never   Smokeless tobacco: Never  Vaping Use   Vaping Use: Never used  Substance Use Topics   Alcohol use: No   Drug use: No     Allergies   Patient has no known allergies.   Review of Systems Review of Systems  As stated above in HPI Physical Exam Triage Vital Signs ED Triage Vitals  Enc Vitals Group     BP 02/03/21 1240 (!) 167/63     Pulse Rate 02/03/21 1240 (!) 56     Resp 02/03/21 1240 20     Temp 02/03/21 1240 98.1 F (36.7 C)     Temp Source 02/03/21 1240 Oral     SpO2 02/03/21 1240 100 %     Weight 02/03/21 1236 (!) 338 lb 6.5 oz (153.5 kg)     Height 02/03/21 1236 '5\' 9"'$  (1.753 m)     Head Circumference --      Peak Flow --      Pain Score 02/03/21 1236 6     Pain Loc --      Pain Edu? --      Excl. in [place]? --    No data found.  Updated Vital Signs BP (!) 167/63 (BP Location: Right Arm)   Pulse (!) 56   Temp 98.1 F (36.7 C) (Oral)   Resp 20   Ht '5\' 9"'$  (1.753 m)   Wt (!) 338 lb 6.5 oz (153.5 kg)   SpO2 100%   BMI 49.97 kg/m   *Pulse rate improved to 67 on auscultation on exam Physical Exam Vitals and nursing note reviewed.  Constitutional:      General: She is not in acute distress.    Appearance: Normal appearance. She is not ill-appearing, toxic-appearing or diaphoretic.  HENT:     Head: Normocephalic and atraumatic.     Right Ear: Tympanic membrane normal.     Left Ear: Tympanic membrane and ear canal normal.  Ears:     Comments: Mild edema of the right external canal with mild tenderness to movement of the auricle and pinna    Nose: Nose normal.     Mouth/Throat:     Mouth: Mucous membranes are moist.     Pharynx: No oropharyngeal exudate or posterior oropharyngeal erythema.  Eyes:     Extraocular Movements: Extraocular movements intact.     Pupils: Pupils are equal, round, and reactive to light.  Cardiovascular:     Rate and Rhythm: Normal rate and regular  rhythm.     Heart sounds: Normal heart sounds.  Pulmonary:     Effort: Pulmonary effort is normal.     Breath sounds: Normal breath sounds.  Abdominal:     Palpations: Abdomen is soft.  Musculoskeletal:     Cervical back: Normal range of motion and neck supple.  Lymphadenopathy:     Head:     Right side of head: Preauricular, posterior auricular and occipital adenopathy present.  Skin:    General: Skin is warm.  Neurological:     Mental Status: She is alert and oriented to person, place, and time.     UC Treatments / Results  Labs (all labs ordered are listed, but only abnormal results are displayed) Labs Reviewed - No data to display  EKG   Radiology No results found.  Procedures Procedures (including critical care time)  Medications Ordered in UC Medications - No data to display  Initial Impression / Assessment and Plan / UC Course  I have reviewed the triage vital signs and the nursing notes.  Pertinent labs & imaging results that were available during my care of the patient were reviewed by me and considered in my medical decision making (see chart for details).     New.  Her pain likely appears to be from the lymphadenopathy likely secondary to her COVID-19 infection.  I recommended that she use a heating pad and she can switch to Tylenol as this is less likely to cause slight elevations in blood pressure.  This should resolve on its own within the next week.  If not she needs to follow-up with her PCP.  I am also sending in Cortisporin drops to help with her beginning or resolving otitis externa.  Discussed red flag signs and symptoms. Final Clinical Impressions(s) / UC Diagnoses   Final diagnoses:  None   Discharge Instructions   None    ED Prescriptions   None    PDMP not reviewed this encounter.   Hughie Closs, [place] 02/03/21 1259

## 2021-02-03 NOTE — ED Triage Notes (Addendum)
Pt c/o right ear pain. Started about a week ago. She states her neck below her ear is painful also. Pt covid positive on 01/28/21

## 2021-02-08 IMAGING — CR DG CHEST 2V
2 series · 2 of 2 positions shown · non-contrast
Comparison: 01/16/2020

CLINICAL DATA: Shortness of breath. Recent history of right rib
fractures.

EXAM:
CHEST - 2 VIEW

[chest pa]
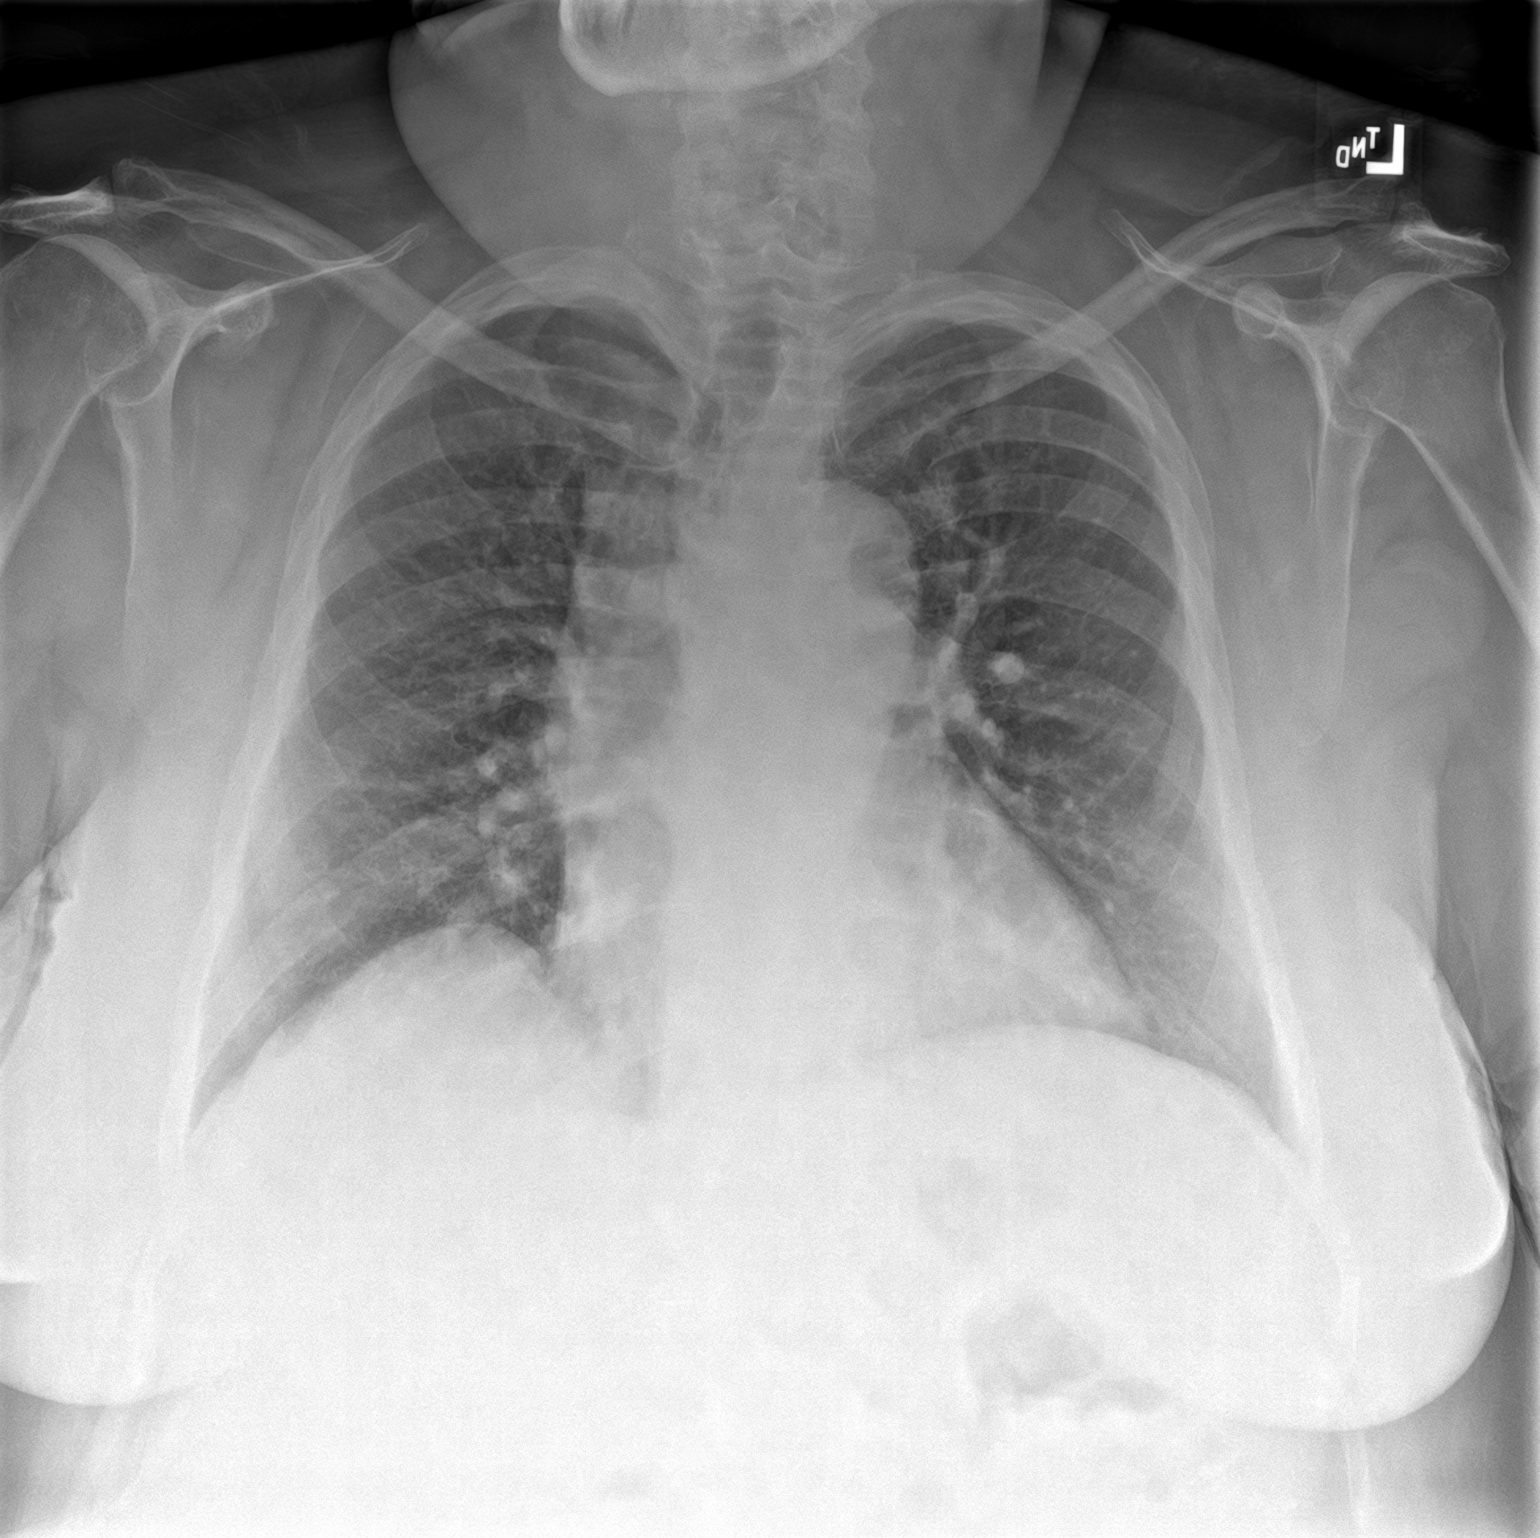

[chest lat]
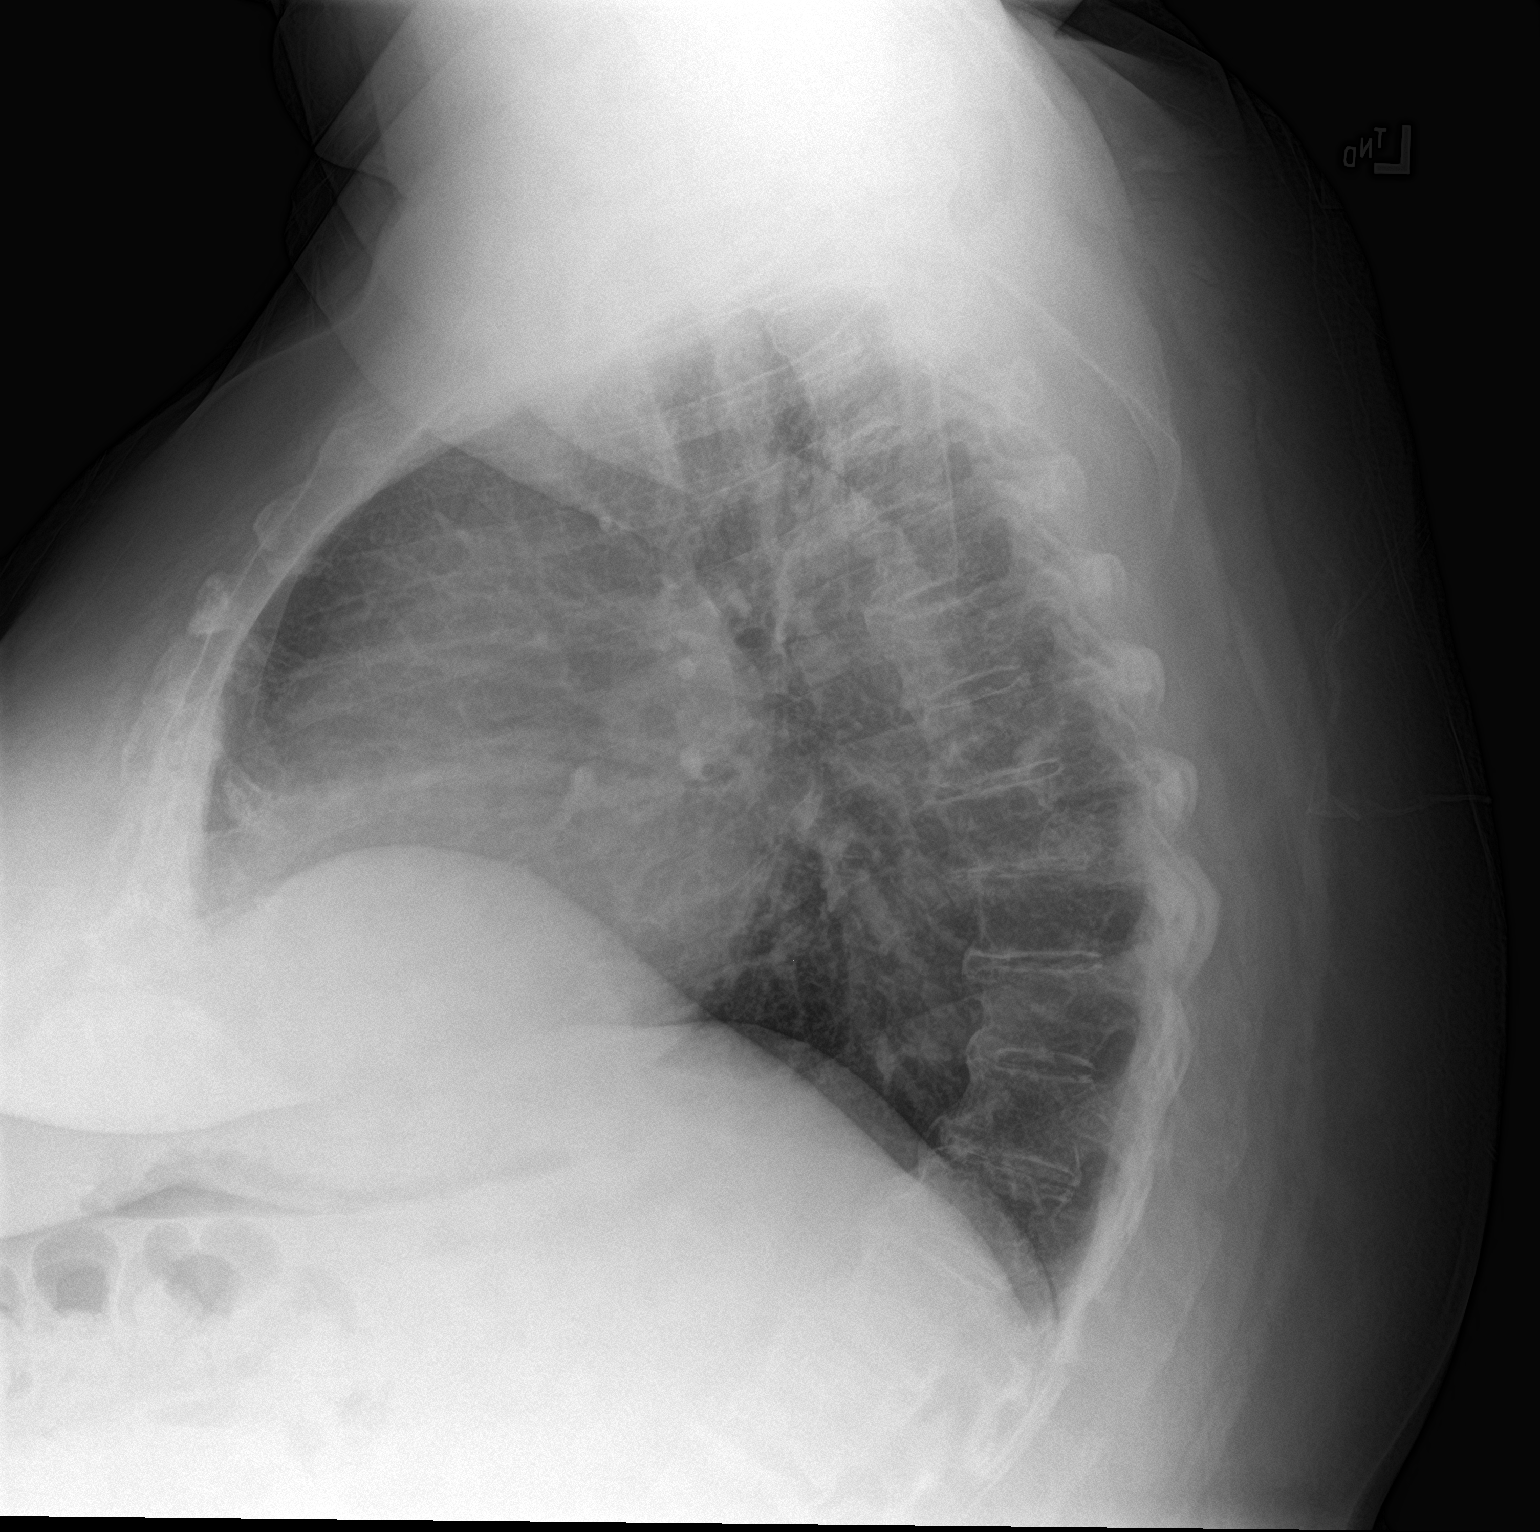

[2 of 2 positions shown; findings below may reference images not displayed]

FINDINGS: Lungs are adequately inflated without focal airspace consolidation
or effusion. No evidence of pneumothorax. Cardiomediastinal
silhouette is within normal. Mild degenerative changes of the spine.
IMPRESSION: No active cardiopulmonary disease.

## 2021-02-14 ENCOUNTER — Ambulatory Visit: Payer: No Typology Code available for payment source | Admitting: Internal Medicine

## 2021-02-23 ENCOUNTER — Ambulatory Visit: Payer: No Typology Code available for payment source | Admitting: Nurse Practitioner

## 2021-02-25 ENCOUNTER — Other Ambulatory Visit: Payer: Self-pay

## 2021-02-25 ENCOUNTER — Ambulatory Visit: Payer: No Typology Code available for payment source | Admitting: Nurse Practitioner

## 2021-02-25 ENCOUNTER — Encounter: Payer: Self-pay | Admitting: Nurse Practitioner

## 2021-02-25 VITALS — BP 170/90 | HR 65 | Temp 98.3°F | Resp 16 | Ht 69.0 in | Wt 343.2 lb

## 2021-02-25 DIAGNOSIS — I10 Essential (primary) hypertension: Secondary | ICD-10-CM | POA: Diagnosis not present

## 2021-02-25 DIAGNOSIS — L659 Nonscarring hair loss, unspecified: Secondary | ICD-10-CM

## 2021-02-25 DIAGNOSIS — J4530 Mild persistent asthma, uncomplicated: Secondary | ICD-10-CM | POA: Diagnosis not present

## 2021-02-25 DIAGNOSIS — E782 Mixed hyperlipidemia: Secondary | ICD-10-CM

## 2021-02-25 DIAGNOSIS — J300 Vasomotor rhinitis: Secondary | ICD-10-CM

## 2021-02-25 DIAGNOSIS — E1165 Type 2 diabetes mellitus with hyperglycemia: Secondary | ICD-10-CM

## 2021-02-25 LAB — POCT GLYCOSYLATED HEMOGLOBIN (HGB A1C): Hemoglobin A1C: 6.4 % — AB (ref 4.0–5.6)

## 2021-02-25 LAB — POCT UA - MICROALBUMIN
Albumin/Creatinine Ratio, Urine, POC: <30
Creatinine, POC: 10 mg/dL
Microalbumin Ur, POC: 10 mg/L

## 2021-02-25 MED ORDER — ROSUVASTATIN CALCIUM 5 MG PO TABS: 5.0000 mg | ORAL_TABLET | Freq: Every day | ORAL | 1 refills | Status: DC

## 2021-02-25 MED ORDER — ALBUTEROL SULFATE HFA 108 (90 BASE) MCG/ACT IN AERS: INHALATION_SPRAY | RESPIRATORY_TRACT | 3 refills | Status: DC

## 2021-02-25 MED ORDER — DULAGLUTIDE 1.5 MG/0.5ML ~~LOC~~ SOAJ: 1.5000 mg | SUBCUTANEOUS | 0 refills | Status: DC

## 2021-02-25 MED ORDER — AMLODIPINE BESYLATE 5 MG PO TABS: 5.0000 mg | ORAL_TABLET | Freq: Every day | ORAL | 1 refills | Status: DC

## 2021-02-25 MED ORDER — FLUTICASONE PROPIONATE 50 MCG/ACT NA SUSP: 2.0000 | Freq: Every day | NASAL | 6 refills | Status: DC

## 2021-02-25 MED ORDER — METOPROLOL SUCCINATE ER 100 MG PO TB24: 100.0000 mg | ORAL_TABLET | Freq: Every day | ORAL | 1 refills | Status: DC

## 2021-02-25 NOTE — Progress Notes (Signed)
[facility] [place], [place] [address]  Internal MEDICINE  Office Visit Note  Patient Name: Candace Clayton  [id number]  IC:[id number]  Date of Service: 02/25/2021  Chief Complaint  Patient presents with   Follow-up    Breathing difficult SOB, hair loss, review meds   Hypertension   Depression   Diabetes   Gastroesophageal Reflux   Sleep Apnea   Anxiety    HPI Vallon presents for a follow up visit for diabetes, medication review, hair loss, and SOB. She reports feeling tired a lot and losing her hair. She gets short of breath easily while walking around at work. She is using her Breztri inhaler as prescribed. She does not have a rescue inhaler to use when she is having SOB or wheezing.  --Her blood pressure is significantly elevated. On 11/01/20, the metoprolol tartrate was discontinued and she was started on metoprolol succinate and amlodipine. Her blood pressure improved significantly. At her previous visit in July, Ashani was not sure which blood pressure medications she was supposed to take. This was clarified on 01/05/21---she was instructed to take hydrochlorothiazide, amlodipine and metoprolol succinate. She was instructed to bring her medications to her next office visit. She brought her medications today and has been taking hydrochlorothiazide and amlodipine but has not been taking metoprolol succinate because the pharmacy has not been giving it to her.  ----Her A1C was checked today, it was 6.4 which is a significant improvement from 8.5 in June 2022. She is currently on trulicity [id number] mg weekly.    Current Medication: Outpatient Encounter Medications as of 02/25/2021  Medication Sig   Budeson-Glycopyrrol-Formoterol (BREZTRI AEROSPHERE) 160-9-4.8 MCG/ACT AERO Inhale 2 puffs into the lungs 2 (two) times daily.   busPIRone (BUSPAR) 15 MG tablet Take 1 tablet (15 mg total) by mouth 3 (three) times daily. Take on tablet TID for anxiety.   desvenlafaxine (PRISTIQ)  100 MG 24 hr tablet Take 1 tablet (100 mg total) by mouth daily.   Dulaglutide 1.5 MG/0.5ML SOPN Inject 1.5 mg into the skin once a week.   ergocalciferol (DRISDOL) 1.25 MG (50000 UT) capsule Take 1 capsule (50,000 Units total) by mouth once a week.   glucose blood (CONTOUR NEXT TEST) test strip 1 each by Other route daily. DX E11.65   hydrochlorothiazide (HYDRODIURIL) 25 MG tablet Take 1 tablet (25 mg total) by mouth daily.   Microlet Lancets MISC 1 each by Does not apply route daily. DX-E11.65   Multiple Vitamins-Calcium (ONE-A-DAY WOMENS PO) Take 1 tablet by mouth daily.   NON FORMULARY cpap device   OVER THE COUNTER MEDICATION 1 tablet daily as needed. Takes generic stool softener for constipation d/t oxycodone.    oxyCODONE (OXY IR/ROXICODONE) 5 MG immediate release tablet Take by mouth.   [START ON 03/05/2021] oxyCODONE-acetaminophen (PERCOCET/ROXICET) 5-325 MG tablet Take 1 tablet by mouth every 8 (eight) hours as needed for severe pain.   pregabalin (LYRICA) 100 MG capsule Take 1 capsule (100 mg total) by mouth 2 (two) times daily. 100 mg qAM, 100-200 mg qhs   [DISCONTINUED] albuterol (VENTOLIN HFA) 108 (90 Base) MCG/ACT inhaler TAKE 2 PUFFS BY MOUTH EVERY 6 HOURS AS NEEDED FOR WHEEZE OR SHORTNESS OF BREATH   [DISCONTINUED] amLODipine (NORVASC) 5 MG tablet Take 1 tablet (5 mg total) by mouth daily.   [DISCONTINUED] cetirizine (ZYRTEC) 10 MG tablet TAKE 1 TABLET(S) BY MOUTH DAILY FOR ALLERGIES   [DISCONTINUED] Dulaglutide 0.75 MG/0.5ML SOPN Inject 0.75 mg into the skin once a week.   [  DISCONTINUED] fluticasone (FLONASE) 50 MCG/ACT nasal spray Place 2 sprays into both nostrils daily.   [DISCONTINUED] hydrOXYzine (ATARAX/VISTARIL) 10 MG tablet Take 1 tablet (10 mg total) by mouth 3 (three) times daily as needed.   [DISCONTINUED] melatonin 3 MG TABS tablet Take by mouth.   [DISCONTINUED] metoprolol succinate (TOPROL-XL) 100 MG 24 hr tablet Take 1 tablet (100 mg total) by mouth daily. TAKE WITH  OR IMMEDIATELY FOLLOWING A MEAL.   [DISCONTINUED] neomycin-polymyxin-hydrocortisone (CORTISPORIN) 3.5-10000-1 OTIC suspension Place 4 drops into the right ear 3 (three) times daily.   [DISCONTINUED] ondansetron (ZOFRAN-ODT) 4 MG disintegrating tablet Take by mouth.   [DISCONTINUED] oxybutynin (DITROPAN-XL) 10 MG 24 hr tablet TAKE 1 TABLET BY MOUTH EVERYDAY AT BEDTIME   [DISCONTINUED] rosuvastatin (CRESTOR) 5 MG tablet Take 1 tablet (5 mg total) by mouth daily.   albuterol (VENTOLIN HFA) 108 (90 Base) MCG/ACT inhaler TAKE 2 PUFFS BY MOUTH EVERY 6 HOURS AS NEEDED FOR WHEEZE OR SHORTNESS OF BREATH   amLODipine (NORVASC) 5 MG tablet Take 1 tablet (5 mg total) by mouth daily.   fluticasone (FLONASE) 50 MCG/ACT nasal spray Place 2 sprays into both nostrils daily.   metoprolol succinate (TOPROL-XL) 100 MG 24 hr tablet Take 1 tablet (100 mg total) by mouth daily. TAKE WITH OR IMMEDIATELY FOLLOWING A MEAL.   rosuvastatin (CRESTOR) 5 MG tablet Take 1 tablet (5 mg total) by mouth daily.   [DISCONTINUED] metoprolol tartrate (LOPRESSOR) 25 MG tablet Take 2 tablets (50 mg total) by mouth 2 (two) times daily.   No facility-administered encounter medications on file as of 02/25/2021.    Surgical History: Past Surgical History:  Procedure Laterality Date   CESAREAN SECTION  1992   CHOLECYSTECTOMY  10/26/2020   COLONOSCOPY     polyps removed first procedure. 2nd time all was clear   COLONOSCOPY WITH PROPOFOL N/A 03/14/2019   Procedure: COLONOSCOPY WITH PROPOFOL;  Surgeon: Lin Landsman, MD;  Location: [facility] ENDOSCOPY;  Service: Gastroenterology;  Laterality: N/A;   FINGER SURGERY Left 2011   left finger cut off x 2.(only up to last digit)   HEMATOMA EVACUATION Left 1999   upper part of foot was injured d/t 500lb weight landing on her foot.    LAPAROSCOPIC SALPINGO OOPHERECTOMY Left 04/02/2018   Procedure: LAPAROSCOPIC SALPINGO OOPHORECTOMY;  Surgeon: Malachy Mood, MD;  Location: [facility] ORS;  Service:  Gynecology;  Laterality: Left;    Medical History: Past Medical History:  Diagnosis Date   Anginal pain (HCC)    tightness related to anxiety   Anxiety    Cholecystolithiasis    Depression    Diabetes mellitus without complication (HCC)    GERD (gastroesophageal reflux disease)    throws up easily but not diagnosed with reflux   History of C-section 1992   Hypertension    Sleep apnea    uses cpap    Family History: Family History  Problem Relation Age of Onset   Breast cancer Mother 44       x 3 times   Colon cancer Mother    Brain cancer Mother    Cancer - Colon Mother    Cancer - Other Mother    Heart Problems Father    Colon cancer Brother    Heart Problems Brother     Social History   Socioeconomic History   Marital status: Divorced    Spouse name: Not on file   Number of children: 1   Years of education: Not on file   Highest education  level: Not on file  Occupational History   Occupation: works in Secretary/administrator  Tobacco Use   Smoking status: Never   Smokeless tobacco: Never  Vaping Use   Vaping Use: Never used  Substance and Sexual Activity   Alcohol use: No   Drug use: No   Sexual activity: Not Currently    Birth control/protection: Post-menopausal  Other Topics Concern   Not on file  Social History Narrative   Son has schizophrenia and autism but is fully capable of helping mother after surgery   Social Determinants of Health   Financial Resource Strain: Not on file  Food Insecurity: Not on file  Transportation Needs: Not on file  Physical Activity: Not on file  Stress: Not on file  Social Connections: Not on file  Intimate Partner Violence: Not on file      Review of Systems  Constitutional:  Negative for chills, fatigue and unexpected weight change.  HENT:  Negative for congestion, rhinorrhea, sneezing and sore throat.   Eyes:  Negative for redness.  Respiratory:  Negative for cough, chest tightness and shortness of breath.    Cardiovascular:  Negative for chest pain and palpitations.  Gastrointestinal:  Negative for abdominal pain, constipation, diarrhea, nausea and vomiting.  Genitourinary:  Negative for dysuria and frequency.  Musculoskeletal:  Negative for arthralgias, back pain, joint swelling and neck pain.  Skin:  Negative for rash.  Neurological: Negative.  Negative for tremors and numbness.  Hematological:  Negative for adenopathy. Does not bruise/bleed easily.  Psychiatric/Behavioral:  Negative for behavioral problems (Depression), sleep disturbance and suicidal ideas. The patient is not nervous/anxious.    Vital Signs: BP (!) 170/90 Comment: 160/94  Pulse 65   Temp 98.3 F (36.8 C)   Resp 16   Ht '5\' 9"'$  (1.753 m)   Wt (!) 343 lb 3.2 oz (155.7 kg)   SpO2 99%   BMI 50.68 kg/m    Physical Exam Vitals reviewed.  Constitutional:      General: She is not in acute distress.    Appearance: Normal appearance. She is obese. She is not ill-appearing.  HENT:     Head: Normocephalic and atraumatic.  Eyes:     Extraocular Movements: Extraocular movements intact.     Pupils: Pupils are equal, round, and reactive to light.  Cardiovascular:     Rate and Rhythm: Normal rate and regular rhythm.  Pulmonary:     Effort: Pulmonary effort is normal. No respiratory distress.  Neurological:     Mental Status: She is alert and oriented to person, place, and time.  Psychiatric:        Mood and Affect: Mood normal.        Behavior: Behavior normal.     Assessment/Plan: 1. Essential hypertension Blood pressure significantly elevated, see vitals; she has not been taking her metoprolol succinate, instructed patient to ask the pharmacy directly to fill this prescription - metoprolol succinate (TOPROL-XL) 100 MG 24 hr tablet; Take 1 tablet (100 mg total) by mouth daily. TAKE WITH OR IMMEDIATELY FOLLOWING A MEAL.  Dispense: 90 tablet; Refill: 1 - amLODipine (NORVASC) 5 MG tablet; Take 1 tablet (5 mg total) by  mouth daily.  Dispense: 90 tablet; Refill: 1  2. Type 2 diabetes mellitus with hyperglycemia, without long-term current use of insulin (HCC) Trulicity dose increased to 1.5 mg weekly. Patient wants to lose weight and is only on 1 medication for diabetes.  - POCT UA - Microalbumin - POCT glycosylated hemoglobin (Hb A1C) -  Dulaglutide 1.5 MG/0.5ML SOPN; Inject 1.5 mg into the skin once a week.  Dispense: 6 mL; Refill: 0  3. Mild persistent asthma without complication Continue breztri as prescribed, discussed use of albuterol rescue inhaler in detail during office visit, order resent to pharmacy, patient instructed to ask the pharmacy to fill her rescue inhaler.  - albuterol (VENTOLIN HFA) 108 (90 Base) MCG/ACT inhaler; TAKE 2 PUFFS BY MOUTH EVERY 6 HOURS AS NEEDED FOR WHEEZE OR SHORTNESS OF BREATH  Dispense: 18 g; Refill: 3  4. Mixed hyperlipidemia Ran out, needs refill, order sent to pharmacy - rosuvastatin (CRESTOR) 5 MG tablet; Take 1 tablet (5 mg total) by mouth daily.  Dispense: 90 tablet; Refill: 1  5. Hair loss Labs ordered, discuss at follow up visit.  - TSH + free T4  6. Vasomotor rhinitis - fluticasone (FLONASE) 50 MCG/ACT nasal spray; Place 2 sprays into both nostrils daily.  Dispense: 16 g; Refill: 6   General Counseling: Elide verbalizes understanding of the findings of todays visit and agrees with plan of treatment. I have discussed any further diagnostic evaluation that may be needed or ordered today. We also reviewed her medications today. she has been encouraged to call the office with any questions or concerns that should arise related to todays visit.    Orders Placed This Encounter  Procedures   TSH + free T4   POCT UA - Microalbumin   POCT glycosylated hemoglobin (Hb A1C)    Meds ordered this encounter  Medications   Dulaglutide 1.5 MG/0.5ML SOPN    Sig: Inject 1.5 mg into the skin once a week.    Dispense:  6 mL    Refill:  0   rosuvastatin (CRESTOR) 5 MG  tablet    Sig: Take 1 tablet (5 mg total) by mouth daily.    Dispense:  90 tablet    Refill:  1   metoprolol succinate (TOPROL-XL) 100 MG 24 hr tablet    Sig: Take 1 tablet (100 mg total) by mouth daily. TAKE WITH OR IMMEDIATELY FOLLOWING A MEAL.    Dispense:  90 tablet    Refill:  1   albuterol (VENTOLIN HFA) 108 (90 Base) MCG/ACT inhaler    Sig: TAKE 2 PUFFS BY MOUTH EVERY 6 HOURS AS NEEDED FOR WHEEZE OR SHORTNESS OF BREATH    Dispense:  18 g    Refill:  3   amLODipine (NORVASC) 5 MG tablet    Sig: Take 1 tablet (5 mg total) by mouth daily.    Dispense:  90 tablet    Refill:  1   fluticasone (FLONASE) 50 MCG/ACT nasal spray    Sig: Place 2 sprays into both nostrils daily.    Dispense:  16 g    Refill:  6    Return in about 4 weeks (around 03/25/2021) for F/U, BP check, Saphia Vanderford PCP.   Total time spent:30 Minutes Time spent includes review of chart, medications, test results, and follow up plan with the patient.   [place] Controlled Substance Database was reviewed by me.  This patient was seen by Jonetta Osgood, FNP-C in collaboration with Dr. Clayborn Bigness as a part of collaborative care agreement.   Makeila Yamaguchi R. Valetta Fuller, MSN, FNP-C Internal medicine

## 2021-02-26 LAB — TSH+FREE T4
Free T4: 0.95 ng/dL (ref 0.82–1.77)
TSH: 1.08 u[IU]/mL (ref 0.450–4.500)

## 2021-02-26 LAB — LIPID PANEL
Chol/HDL Ratio: 2.5 ratio (ref 0.0–4.4)
Cholesterol, Total: 164 mg/dL (ref 100–199)
HDL: 66 mg/dL (ref >39)
LDL Chol Calc (NIH): 66 mg/dL (ref 0–99)
Triglycerides: 196 mg/dL — ABNORMAL HIGH (ref 0–149)
VLDL Cholesterol Cal: 32 mg/dL (ref 5–40)

## 2021-03-02 ENCOUNTER — Telehealth: Payer: Self-pay

## 2021-03-02 NOTE — Telephone Encounter (Signed)
Pt called requesting refills for rosuvastatin, metoprolol, and amlodipine, I informed pt that new rx's were sent to her pharmacy on 02/25/21 I told pt to call her pharmacy and check to make sure they received the rx's.  Pt called back and advised that the pharmacy said it was to early to pick up and pt states she is out of medication.  I called and spoke to pharmacy and they informed that pt should still have medication.  Last refill on rosuvastatin 01/18/21 for 90 days, Amlodipine 12/28/20 for 90 days, Metoprolol 02/17/21 for 30 days.  I called pt back and informed her of the last refills and advised that she should look in the house to see if she missed placed the recent refills.

## 2021-03-09 ENCOUNTER — Ambulatory Visit: Payer: No Typology Code available for payment source

## 2021-03-17 ENCOUNTER — Encounter: Payer: Self-pay | Admitting: Student in an Organized Health Care Education/Training Program

## 2021-03-17 ENCOUNTER — Other Ambulatory Visit: Payer: Self-pay

## 2021-03-17 ENCOUNTER — Ambulatory Visit
Payer: No Typology Code available for payment source | Attending: Student in an Organized Health Care Education/Training Program | Admitting: Student in an Organized Health Care Education/Training Program

## 2021-03-17 VITALS — BP 160/72 | HR 64 | Temp 97.2°F | Resp 18 | Ht 69.0 in | Wt 330.0 lb

## 2021-03-17 DIAGNOSIS — G894 Chronic pain syndrome: Secondary | ICD-10-CM | POA: Diagnosis not present

## 2021-03-17 DIAGNOSIS — M5136 Other intervertebral disc degeneration, lumbar region: Secondary | ICD-10-CM

## 2021-03-17 DIAGNOSIS — M47816 Spondylosis without myelopathy or radiculopathy, lumbar region: Secondary | ICD-10-CM

## 2021-03-17 DIAGNOSIS — J989 Respiratory disorder, unspecified: Secondary | ICD-10-CM

## 2021-03-17 MED ORDER — OXYCODONE-ACETAMINOPHEN 5-325 MG PO TABS: 1.0000 | ORAL_TABLET | Freq: Three times a day (TID) | ORAL | 0 refills | Status: DC | PRN

## 2021-03-17 MED ORDER — PREGABALIN 100 MG PO CAPS: 100.0000 mg | ORAL_CAPSULE | Freq: Two times a day (BID) | ORAL | 5 refills | Status: DC

## 2021-03-17 NOTE — Patient Instructions (Signed)
______________________________________________________________________  Preparing for Procedure with Sedation  NOTICE: Due to recent regulatory changes, starting on January 24, 2021, procedures requiring intravenous (IV) sedation will no longer be performed at the [facility].  These types of procedures are required to be performed at [facility] ambulatory surgery facility.  We are very sorry for the inconvenience.  Procedure appointments are limited to planned procedures: No Prescription Refills. No disability issues will be discussed. No medication changes will be discussed.  Instructions: Oral Intake: Do not eat or drink anything for at least 8 hours prior to your procedure. (Exception: Blood Pressure Medication. See below.) Transportation: A driver is required. You may not drive yourself after the procedure. Blood Pressure Medicine: Do not forget to take your blood pressure medicine with a sip of water the morning of the procedure. If your Diastolic (lower reading) is above 100 mmHg, elective cases will be cancelled/rescheduled. Blood thinners: These will need to be stopped for procedures. Notify our staff if you are taking any blood thinners. Depending on which one you take, there will be specific instructions on how and when to stop it. Diabetics on insulin: Notify the staff so that you can be scheduled 1st case in the morning. If your diabetes requires high dose insulin, take only  of your normal insulin dose the morning of the procedure and notify the staff that you have done so. Preventing infections: Shower with an antibacterial soap the morning of your procedure. Build-up your immune system: Take 1000 mg of Vitamin C with every meal (3 times a day) the day prior to your procedure. Antibiotics: Inform the staff if you have a condition or reason that requires you to take antibiotics before dental procedures. Pregnancy: If you are pregnant, call and cancel the procedure. Sickness: If  you have a cold, fever, or any active infections, call and cancel the procedure. Arrival: You must be in the facility at least 30 minutes prior to your scheduled procedure. Children: Do not bring children with you. Dress appropriately: Bring dark clothing that you would not mind if they get stained. Valuables: Do not bring any jewelry or valuables.  Reasons to call and reschedule or cancel your procedure: (Following these recommendations will minimize the risk of a serious complication.) Surgeries: Avoid having procedures within 2 weeks of any surgery. (Avoid for 2 weeks before or after any surgery). Flu Shots: Avoid having procedures within 2 weeks of a flu shots. (Avoid for 2 weeks before or after immunizations). Barium: Avoid having a procedure within 7-10 days after having had a radiological study involving the use of radiological contrast. (Myelograms, Barium swallow or enema study). Heart attacks: Avoid any elective procedures or surgeries for the initial 6 months after a "Myocardial Infarction" (Heart Attack). Blood thinners: It is imperative that you stop these medications before procedures. Let us know if you if you take any blood thinner.  Infection: Avoid procedures during or within two weeks of an infection (including chest colds or gastrointestinal problems). Symptoms associated with infections include: Localized redness, fever, chills, night sweats or profuse sweating, burning sensation when voiding, cough, congestion, stuffiness, runny nose, sore throat, diarrhea, nausea, vomiting, cold or Flu symptoms, recent or current infections. It is specially important if the infection is over the area that we intend to treat. Heart and lung problems: Symptoms that may suggest an active cardiopulmonary problem include: cough, chest pain, breathing difficulties or shortness of breath, dizziness, ankle swelling, uncontrolled high or unusually low blood pressure, and/or palpitations. If you are    experiencing any of these symptoms, cancel your procedure and contact your primary care physician for an evaluation.  Remember:  Regular Business hours are:  Monday to Thursday 8:00 AM to 4:00 PM  Provider's Schedule: Francisco Naveira, MD:  Procedure days: Tuesday and Thursday 7:30 AM to 4:00 PM  Bilal Lateef, MD:  Procedure days: Monday and Wednesday 7:30 AM to 4:00 PM ______________________________________________________________________   

## 2021-03-17 NOTE — Progress Notes (Signed)
SNursing Pain Medication Assessment:  Safety precautions to be maintained throughout the outpatient stay will include: orient to surroundings, keep bed in low position, maintain call bell within reach at all times, provide assistance with transfer out of bed and ambulation.  Medication Inspection Compliance: Pill count conducted under aseptic conditions, in front of the patient. Neither the pills nor the bottle was removed from the patient's sight at any time. Once count was completed pills were immediately returned to the patient in their original bottle.  Medication: Oxycodone/APAP Pill/Patch Count:  53 of 90 pills remain Pill/Patch Appearance: Markings consistent with prescribed medication Bottle Appearance: Standard pharmacy container. Clearly labeled. Filled Date: 09 / 01 / 2022 Last Medication intake:  Today

## 2021-03-17 NOTE — Progress Notes (Signed)
PROVIDER NOTE: Information contained herein reflects review and annotations entered in association with encounter. Interpretation of such information and data should be left to medically-trained personnel. Information provided to patient can be located elsewhere in the medical record under "Patient Instructions". Document created using STT-dictation technology, any transcriptional errors that may result from process are unintentional.    Patient: Candace Clayton  Service Category: E/M  Provider: Gillis Santa, MD  DOB: December 09, 1960  DOS: 03/17/2021  Specialty: Interventional Pain Management  MRN: [id number]  Setting: Ambulatory outpatient  PCP: Lavera Guise, MD  Type: Established Patient    Referring Provider: Lavera Guise, MD  Location: Office  Delivery: Face-to-face     HPI  [name], a 60 y.o. year old female, is here today because of her Lumbar facet arthropathy [M47.816]. [name] primary complain today is Back Pain (lower) Last encounter: My last encounter with her was on 12/28/2020. Pertinent problems: [name] has Major depressive disorder, recurrent episode, moderate ([place]); Morbid obesity ([place]); Low back pain with sciatica; OSA on CPAP; Lumbar degenerative disc disease; Lumbar facet arthropathy (R>L); Lumbar spondylosis; Chronic pain syndrome; and Type 2 diabetes mellitus with hyperglycemia, without long-term current use of insulin (HCC) on their pertinent problem list. Pain Assessment: Severity of Chronic pain is reported as a 5 /10. Location: Back  /right upper leg. Onset: More than a month ago. Quality: Shooting. Timing: Intermittent. Modifying factor(s): rest, medications. Vitals:  height is 5' 9"  (1.753 m) and weight is 330 lb (149.7 kg) (abnormal). Her temporal temperature is 97.2 F (36.2 C) (abnormal). Her blood pressure is 160/72 (abnormal) and her pulse is 64. Her respiration is 18 and oxygen saturation is 99%.   Reason for encounter: medication management.    Candace Clayton  presents today for medication management.  She did have COVID since her last clinic visit with me.  She continues to work 12-hour shifts.  She is helping to continue working for the next 2 years so she can get retirement.  Of note patient is status post right L3, L4, L5 RFA on 11/15/2020 that is continue to provide significant pain relief for her right axial low back pain.  She was supposed to follow-up for her left side but unfortunately contracted COVID and did not follow-up for her procedure.  She states that she will now proceed with left L3, L4, 5 RFA.  Orders in place which staff can release.  Otherwise I will refill her Percocet and Lyrica as below.  Patient is requesting an increase in her Percocet and I recommend that we proceed with the ablation and then evaluate how she is doing.  Pharmacotherapy Assessment  Analgesic: Percocet 5 mg 3 times daily as needed, quantity 90/month; MME equals 22.5    Monitoring: [place] PMP: PDMP reviewed during this encounter.       Pharmacotherapy: No side-effects or adverse reactions reported. Compliance: No problems identified. Effectiveness: Clinically acceptable.  Landis Martins, RN  03/17/2021  8:09 AM  Sign when Signing Visit SNursing Pain Medication Assessment:  Safety precautions to be maintained throughout the outpatient stay will include: orient to surroundings, keep bed in low position, maintain call bell within reach at all times, provide assistance with transfer out of bed and ambulation.  Medication Inspection Compliance: Pill count conducted under aseptic conditions, in front of the patient. Neither the pills nor the bottle was removed from the patient's sight at any time. Once count was completed pills were immediately returned to the patient in  their original bottle.  Medication: Oxycodone/APAP Pill/Patch Count:  53 of 90 pills remain Pill/Patch Appearance: Markings consistent with prescribed medication Bottle Appearance: Standard pharmacy  container. Clearly labeled. Filled Date: 09 / 01 / 2022 Last Medication intake:  Today   UDS:  Summary  Date Value Ref Range Status  12/28/2020 Note  Final    Comment:    ==================================================================== [employer] (MW) ==================================================================== Test                             Result       Flag       Units  Drug Present and Declared for Prescription Verification   Oxycodone                      1252         EXPECTED   ng/mg creat   Oxymorphone                    748          EXPECTED   ng/mg creat   Noroxycodone                   1206         EXPECTED   ng/mg creat   Noroxymorphone                 232          EXPECTED   ng/mg creat    Sources of oxycodone are scheduled prescription medications.    Oxymorphone, noroxycodone, and noroxymorphone are expected    metabolites of oxycodone. Oxymorphone is also available as a    scheduled prescription medication.  ==================================================================== Test                      Result    Flag   Units      Ref Range   Creatinine              126              mg/dL      >=20 ==================================================================== Declared Medications:  The flagging and interpretation on this report are based on the  following declared medications.  Unexpected results may arise from  inaccuracies in the declared medications.   **Note: The testing scope of this panel includes these medications:   Oxycodone (Roxicodone)  Oxycodone (Percocet)   **Note: The testing scope of this panel does not include the  following reported medications:   Acetaminophen (Percocet)  Albuterol (Ventolin HFA)  Amlodipine (Norvasc)  Budesonide (Breztri Aerosphere)  Buspirone (Buspar)  Cetirizine (Zyrtec)  Desvenlafaxine (Pristiq)  Docusate (Stool Softener)  Dulaglutide  Enalapril (Vasotec)  Fluticasone (Flonase)   Formoterol (Breztri Aerosphere)  Glycopyrrolate (Breztri Aerosphere)  Hydrochlorothiazide (Hydrodiuril)  Hydroxyzine (Atarax)  Melatonin  Metformin (Glucophage)  Metoprolol (Toprol)  Multivitamin  Ondansetron (Zofran)  Oxybutynin (Ditropan)  Pregabalin (Lyrica)  Rosuvastatin (Crestor)  Vitamin D2 ==================================================================== For clinical consultation, please call [phone]. ====================================================================      ROS  Constitutional: Denies any fever or chills Gastrointestinal: No reported hemesis, hematochezia, vomiting, or acute GI distress Musculoskeletal:  Left low back pain, worse with lumbar extension Neurological: No reported episodes of acute onset apraxia, aphasia, dysarthria, agnosia, amnesia, paralysis, loss of coordination, or loss of consciousness  Medication Review  Budeson-Glycopyrrol-Formoterol, Dulaglutide, Microlet Lancets, Multiple Vitamins-Minerals, NON FORMULARY, OVER THE COUNTER MEDICATION,  albuterol, amLODipine, busPIRone, desvenlafaxine, ergocalciferol, fluticasone, glucose blood, hydrochlorothiazide, metoprolol succinate, metoprolol tartrate, oxyCODONE, oxyCODONE-acetaminophen, pregabalin, and rosuvastatin  History Review  Allergy: [name] has No Known Allergies. Drug: [name]  reports no history of drug use. Alcohol:  reports no history of alcohol use. Tobacco:  reports that she has never smoked. She has never used smokeless tobacco. Social: [name]  reports that she has never smoked. She has never used smokeless tobacco. She reports that she does not drink alcohol and does not use drugs. Medical:  has a past medical history of Anginal pain ([place]), Anxiety, Cholecystolithiasis, Depression, Diabetes mellitus without complication ([place]), GERD (gastroesophageal reflux disease), History of C-section (1992), Hypertension, and Sleep apnea. Surgical: [name]  has a past  surgical history that includes Cesarean section (1992); Finger surgery (Left, 2011); Hematoma evacuation (Left, 1999); Colonoscopy; Laparoscopic salpingo oophorectomy (Left, 04/02/2018); Colonoscopy with propofol (N/A, 03/14/2019); and Cholecystectomy (10/26/2020). Family: family history includes Brain cancer in her mother; Breast cancer (age of onset: 56) in her mother; Cancer - Colon in her mother; Cancer - Other in her mother; Colon cancer in her brother and mother; Heart Problems in her brother and father.  Laboratory Chemistry Profile   Renal Lab Results  Component Value Date   BUN 16 02/23/2020   CREATININE 0.72 02/23/2020   BCR 22 02/23/2020   GFRAA 106 02/23/2020   GFRNONAA 92 02/23/2020    Hepatic Lab Results  Component Value Date   AST 18 11/18/2020   ALT 24 11/18/2020   ALBUMIN 4.1 11/18/2020   ALKPHOS 120 11/18/2020    Electrolytes Lab Results  Component Value Date   NA 141 02/23/2020   K 5.0 02/23/2020   CL 103 02/23/2020   CALCIUM 9.9 02/23/2020    Bone Lab Results  Component Value Date   VD25OH 12.6 (L) 02/23/2020    Inflammation (CRP: Acute Phase) (ESR: Chronic Phase) No results found for: CRP, ESRSEDRATE, LATICACIDVEN        Note: Above Lab results reviewed.  Recent Imaging Review  MR Abdomen W Wo Contrast CLINICAL DATA:  Evaluate liver cyst post gallbladder removal. Reported 1.8 x 1.8 x 1.9 cm hypoechoic lesion in the right hepatic lobe on ultrasound done on 05/03.  EXAM: MRI ABDOMEN WITHOUT AND WITH CONTRAST  TECHNIQUE: Multiplanar multisequence MR imaging of the abdomen was performed both before and after the administration of intravenous contrast.  CONTRAST:  105m GADAVIST GADOBUTROL 1 MMOL/ML IV SOLN  COMPARISON:  Chest CT 11/11/2020. Abdominal ultrasound 06/12/2018 and abdominal CT 04/03/2018. The referenced recent ultrasound is not available.  FINDINGS: Lower chest:  The visualized lower chest appears unremarkable.  Hepatobiliary: The  liver is enlarged with heterogeneous steatosis. There is heterogeneous loss of signal on the gradient echo opposed phase images. There are no morphologic changes of cirrhosis. Inferiorly in the right hepatic lobe, there is an approximately 2.4 cm nodular area of relatively increased signal on the gradient echo opposed phase images (image 26/9). This area demonstrates no abnormal arterial phase enhancement, although is mildly hyperintense on the delayed post-contrast images, similar to blood pool. In review of the 2019 CT, there is a similar appearing area of relative hyperdensity in this area (image 36/2), favoring focal sparing or an incidental vascular lesion. No suspicious liver lesions are seen. Small gallstones. No gallbladder wall thickening or biliary dilatation.  Pancreas: Unremarkable. No pancreatic ductal dilatation or surrounding inflammatory changes.  Spleen: Normal in size without focal abnormality.  Adrenals/Urinary Tract: Both adrenal glands appear normal.  No evidence of renal mass or hydronephrosis.  Stomach/Bowel: The stomach appears unremarkable for its degree of distension. No evidence of bowel wall thickening, distention or surrounding inflammatory change.  Vascular/Lymphatic: There are no enlarged abdominal lymph nodes. No significant vascular findings.  Other: No ascites.  Musculoskeletal: No acute or significant osseous findings.  IMPRESSION: 1. The liver is enlarged with heterogeneous hepatic steatosis. A nodular area of relatively increased signal on out of phase gradient echo imaging and delayed contrast enhancement appears similar to previous CT from 2019 and is favored to reflect focal sparing of steatosis or an incidental vascular lesion. This may correspond with the reported ultrasound finding. No other focal hepatic lesions are identified, and there are no morphologic changes of cirrhosis. 2. Cholelithiasis without evidence of cholecystitis or  biliary dilatation. 3. No acute abdominal findings.  Electronically Signed   By: Richardean Sale M.D.   On: 12/03/2020 15:12 Note: Reviewed        Physical Exam  General appearance: Well nourished, well developed, and well hydrated. In no apparent acute distress Mental status: Alert, oriented x 3 (person, place, & time)       Respiratory: No evidence of acute respiratory distress Eyes: PERLA Vitals: BP (!) 160/72   Pulse 64   Temp (!) 97.2 F (36.2 C) (Temporal)   Resp 18   Ht 5' 9"  (1.753 m)   Wt (!) 330 lb (149.7 kg)   SpO2 99%   BMI 48.73 kg/m  BMI: Estimated body mass index is 48.73 kg/m as calculated from the following:   Height as of this encounter: 5' 9"  (1.753 m).   Weight as of this encounter: 330 lb (149.7 kg). Ideal: Ideal body weight: 66.2 kg (145 lb 15.1 oz) Adjusted ideal body weight: 99.6 kg (219 lb 9.1 oz)  Lumbar Spine Area Exam  Skin & Axial Inspection: No masses, redness, or swelling Alignment: Symmetrical Functional ROM: Limited  Stability: No instability detected Muscle Tone/Strength: Functionally intact. No obvious neuro-muscular anomalies detected. Sensory (Neurological): facetogenic Palpation: No palpable anomalies       Provocative Tests: Hyperextension/rotation test: (+) bilaterally for facet joint pain. L>R, right side doing better after RFA Lumbar quadrant test (Kemp's test): deferred today       Lateral bending test: (+) due to pain.,     Gait & Posture Assessment  Ambulation: Unassisted Gait: Relatively normal for age and body habitus Posture: WNL    Lower Extremity Exam      Side: Right lower extremity   Side: Left lower extremity  Stability: No instability observed           Stability: No instability observed          Skin & Extremity Inspection: Skin color, temperature, and hair growth are WNL. No peripheral edema or cyanosis. No masses, redness, swelling, asymmetry, or associated skin lesions. No contractures.   Skin & Extremity  Inspection: Skin color, temperature, and hair growth are WNL. No peripheral edema or cyanosis. No masses, redness, swelling, asymmetry, or associated skin lesions. No contractures.  Functional ROM: Unrestricted ROM                   Functional ROM: Unrestricted ROM                  Muscle Tone/Strength: Functionally intact. No obvious neuro-muscular anomalies detected.   Muscle Tone/Strength: Functionally intact. No obvious neuro-muscular anomalies detected.  Sensory (Neurological): Unimpaired         Sensory (Neurological):  Unimpaired        DTR: Patellar: deferred today Achilles: deferred today Plantar: deferred today   DTR: Patellar: deferred today Achilles: deferred today Plantar: deferred today  Palpation: No palpable anomalies   Palpation: No palpable anomalies    Assessment   Status Diagnosis  Responding Responding Controlled 1. Lumbar facet arthropathy    2. Lumbar spondylosis   3. Lumbar degenerative disc disease   4. Respiratory illness   5. Chronic pain syndrome      Updated Problems: No problems updated.  Plan of Care   Candace Clayton has a current medication list which includes the following long-term medication(s): albuterol, amlodipine, desvenlafaxine, fluticasone, hydrochlorothiazide, metoprolol succinate, rosuvastatin, [START ON 04/05/2021] oxycodone-acetaminophen, [START ON 05/05/2021] oxycodone-acetaminophen, [START ON 06/04/2021] oxycodone-acetaminophen, pregabalin, and [DISCONTINUED] metoprolol tartrate.  Pharmacotherapy (Medications Ordered): Meds ordered this encounter  Medications   oxyCODONE-acetaminophen (PERCOCET/ROXICET) 5-325 MG tablet    Sig: Take 1 tablet by mouth every 8 (eight) hours as needed for severe pain.    Dispense:  90 tablet    Refill:  0    For chronic pain syndrome. To last for 30 days from fill date.   oxyCODONE-acetaminophen (PERCOCET/ROXICET) 5-325 MG tablet    Sig: Take 1 tablet by mouth every 8 (eight) hours as needed  for severe pain.    Dispense:  90 tablet    Refill:  0    For chronic pain syndrome. To last for 30 days from fill date.   oxyCODONE-acetaminophen (PERCOCET/ROXICET) 5-325 MG tablet    Sig: Take 1 tablet by mouth every 8 (eight) hours as needed for severe pain.    Dispense:  90 tablet    Refill:  0    For chronic pain syndrome. To last for 30 days from fill date.   pregabalin (LYRICA) 100 MG capsule    Sig: Take 1 capsule (100 mg total) by mouth 2 (two) times daily. 100 mg qAM, 100-200 mg qhs    Dispense:  60 capsule    Refill:  5    Fill one day early if pharmacy is closed on scheduled refill date. May substitute for generic if available.   Orders:  No orders of the defined types were placed in this encounter.   Follow-up for left L3, L4, L5 lumbar radiofrequency ablation.  Previously done 05/03/2020 which provided 75% pain relief for almost 6 months.  Order is already in place needs to be released by front staff.  Follow-up plan:   Return in about 2 weeks (around 03/31/2021) for Left L3, 4, 5 RFA , moderate sedation (ECT suite).     Status post diagnostic bilateral L3, L4, L5, S1 facet medial branch nerve blocks, status post right L3, L4, L5, S1 RFA on 05/05/2019, 04/15/20; left L3, L4, L5 RFA 05/03/2020.        Recent Visits Date Type Provider Dept  12/28/20 Office Visit Gillis Santa, MD [facility]  Showing recent visits within past 90 days and meeting all other requirements Today's Visits Date Type Provider Dept  03/17/21 Office Visit Gillis Santa, MD [facility]  Showing today's visits and meeting all other requirements Future Appointments Date Type Provider Dept  03/28/21 Appointment Gillis Santa, MD [facility]  Showing future appointments within next 90 days and meeting all other requirements I discussed the assessment and treatment plan with the patient. The patient was provided an opportunity to ask questions and all were answered. The  patient agreed with the plan and demonstrated  an understanding of the instructions.  Patient advised to call back or seek an in-person evaluation if the symptoms or condition worsens.  Duration of encounter: 30 minutes.  Note by: Gillis Santa, MD Date: 03/17/2021; Time: 8:32 AM

## 2021-03-25 ENCOUNTER — Ambulatory Visit: Payer: No Typology Code available for payment source | Admitting: Nurse Practitioner

## 2021-03-25 ENCOUNTER — Telehealth: Payer: Self-pay

## 2021-03-25 ENCOUNTER — Encounter: Payer: Self-pay | Admitting: Nurse Practitioner

## 2021-03-25 ENCOUNTER — Other Ambulatory Visit: Payer: Self-pay

## 2021-03-25 VITALS — BP 164/71 | HR 62 | Temp 97.6°F | Resp 16 | Ht 69.0 in | Wt 345.2 lb

## 2021-03-25 DIAGNOSIS — J441 Chronic obstructive pulmonary disease with (acute) exacerbation: Secondary | ICD-10-CM

## 2021-03-25 DIAGNOSIS — I10 Essential (primary) hypertension: Secondary | ICD-10-CM

## 2021-03-25 DIAGNOSIS — L659 Nonscarring hair loss, unspecified: Secondary | ICD-10-CM

## 2021-03-25 DIAGNOSIS — E1165 Type 2 diabetes mellitus with hyperglycemia: Secondary | ICD-10-CM

## 2021-03-25 DIAGNOSIS — J45901 Unspecified asthma with (acute) exacerbation: Secondary | ICD-10-CM

## 2021-03-25 DIAGNOSIS — E782 Mixed hyperlipidemia: Secondary | ICD-10-CM

## 2021-03-25 MED ORDER — AMLODIPINE BESYLATE 10 MG PO TABS: 10.0000 mg | ORAL_TABLET | Freq: Every day | ORAL | 1 refills | Status: DC

## 2021-03-25 MED ORDER — IPRATROPIUM-ALBUTEROL 0.5-2.5 (3) MG/3ML IN SOLN: 3.0000 mL | RESPIRATORY_TRACT | 3 refills | Status: DC | PRN

## 2021-03-25 MED ORDER — MINOXIDIL 10 MG PO TABS
10.0000 mg | ORAL_TABLET | Freq: Every day | ORAL | 2 refills | Status: DC
Start: 2021-03-25 — End: 2021-11-28

## 2021-03-25 MED ORDER — TRULICITY 3 MG/0.5ML ~~LOC~~ SOAJ: 3.0000 mg | SUBCUTANEOUS | 2 refills | Status: DC

## 2021-03-25 NOTE — Progress Notes (Signed)
[facility] [place], [place] [address]  Internal MEDICINE  Office Visit Note  Patient Name: Candace Clayton  [id number]  [id number]  Date of Service: 03/25/2021  Chief Complaint  Patient presents with  . Follow-up    Hair loss, BP, review lab work, discuss breathing  . Anxiety  . Depression  . Gastroesophageal Reflux  . Diabetes  . Sleep Apnea  . Hypertension    HPI Anysia presents for a follow up visit for hypertension, SOB, diabetes, and hyperlipidemia. He has had her labs drawn. She continues to use her trelegy inhaler but is still having SOB often. She does not have a nebulizer but in office nebulizer treatments have helped in the past.  Her A1C on 02/25/21 was 6.4 which was significantly improved from June at 8.5. Her weight has increased by 15 lbs since her previous office visit.  Her blood pressure is still not optimally controlled.     Current Medication: Outpatient Encounter Medications as of 03/25/2021  Medication Sig  . albuterol (VENTOLIN HFA) 108 (90 Base) MCG/ACT inhaler TAKE 2 PUFFS BY MOUTH EVERY 6 HOURS AS NEEDED FOR WHEEZE OR SHORTNESS OF BREATH  . amLODipine (NORVASC) 10 MG tablet Take 1 tablet (10 mg total) by mouth daily.  . Budeson-Glycopyrrol-Formoterol (BREZTRI AEROSPHERE) 160-9-4.8 MCG/ACT AERO Inhale 2 puffs into the lungs 2 (two) times daily.  . busPIRone (BUSPAR) 15 MG tablet Take 1 tablet (15 mg total) by mouth 3 (three) times daily. Take on tablet TID for anxiety.  Marland Kitchen desvenlafaxine (PRISTIQ) 100 MG 24 hr tablet Take 1 tablet (100 mg total) by mouth daily.  . Dulaglutide (TRULICITY) 3 XT/0.6YI SOPN Inject 3 mg as directed once a week.  . ergocalciferol (DRISDOL) 1.25 MG (50000 UT) capsule Take 1 capsule (50,000 Units total) by mouth once a week.  . fluticasone (FLONASE) 50 MCG/ACT nasal spray Place 2 sprays into both nostrils daily.  Marland Kitchen glucose blood (CONTOUR NEXT TEST) test strip 1 each by Other route daily. DX E11.65  .  hydrochlorothiazide (HYDRODIURIL) 25 MG tablet Take 1 tablet (25 mg total) by mouth daily.  Marland Kitchen ipratropium-albuterol (DUONEB) 0.5-2.5 (3) MG/3ML SOLN Take 3 mLs by nebulization every 4 (four) hours as needed (shortness of breath or wheezing).  . metoprolol succinate (TOPROL-XL) 100 MG 24 hr tablet Take 1 tablet (100 mg total) by mouth daily. TAKE WITH OR IMMEDIATELY FOLLOWING A MEAL.  . Microlet Lancets MISC 1 each by Does not apply route daily. DX-E11.65  . minoxidil (LONITEN) 10 MG tablet Take 1 tablet (10 mg total) by mouth daily.  . Multiple Vitamins-Calcium (ONE-A-DAY WOMENS PO) Take 1 tablet by mouth daily.  . NON FORMULARY cpap device  . OVER THE COUNTER MEDICATION 1 tablet daily as needed. Takes generic stool softener for constipation d/t oxycodone.   Marland Kitchen oxyCODONE (OXY IR/ROXICODONE) 5 MG immediate release tablet Take by mouth.  . oxyCODONE-acetaminophen (PERCOCET/ROXICET) 5-325 MG tablet Take 1 tablet by mouth every 8 (eight) hours as needed for severe pain.  Derrill Memo ON 05/05/2021] oxyCODONE-acetaminophen (PERCOCET/ROXICET) 5-325 MG tablet Take 1 tablet by mouth every 8 (eight) hours as needed for severe pain.  Derrill Memo ON 06/04/2021] oxyCODONE-acetaminophen (PERCOCET/ROXICET) 5-325 MG tablet Take 1 tablet by mouth every 8 (eight) hours as needed for severe pain.  . pregabalin (LYRICA) 100 MG capsule Take 1 capsule (100 mg total) by mouth 2 (two) times daily. 100 mg qAM, 100-200 mg qhs  . rosuvastatin (CRESTOR) 5 MG tablet Take 1 tablet (5  mg total) by mouth daily.  . [DISCONTINUED] amLODipine (NORVASC) 5 MG tablet Take 1 tablet (5 mg total) by mouth daily.  . [DISCONTINUED] Dulaglutide 1.5 MG/0.5ML SOPN Inject 1.5 mg into the skin once a week.  . [DISCONTINUED] metoprolol tartrate (LOPRESSOR) 25 MG tablet Take 2 tablets (50 mg total) by mouth 2 (two) times daily.   No facility-administered encounter medications on file as of 03/25/2021.    Surgical History: Past Surgical History:   Procedure Laterality Date  . CESAREAN SECTION  1992  . CHOLECYSTECTOMY  10/26/2020  . COLONOSCOPY     polyps removed first procedure. 2nd time all was clear  . COLONOSCOPY WITH PROPOFOL N/A 03/14/2019   Procedure: COLONOSCOPY WITH PROPOFOL;  Surgeon: Lin Landsman, MD;  Location: [facility] ENDOSCOPY;  Service: Gastroenterology;  Laterality: N/A;  . FINGER SURGERY Left 2011   left finger cut off x 2.(only up to last digit)  . HEMATOMA EVACUATION Left 1999   upper part of foot was injured d/t 500lb weight landing on her foot.   Marland Kitchen LAPAROSCOPIC SALPINGO OOPHERECTOMY Left 04/02/2018   Procedure: LAPAROSCOPIC SALPINGO OOPHORECTOMY;  Surgeon: Malachy Mood, MD;  Location: [facility] ORS;  Service: Gynecology;  Laterality: Left;    Medical History: Past Medical History:  Diagnosis Date  . Anginal pain (HCC)    tightness related to anxiety  . Anxiety   . Cholecystolithiasis   . Depression   . Diabetes mellitus without complication ([place])   . GERD (gastroesophageal reflux disease)    throws up easily but not diagnosed with reflux  . History of C-section 58  . Hypertension   . Sleep apnea    uses cpap    Family History: Family History  Problem Relation Age of Onset  . Breast cancer Mother 40       x 3 times  . Colon cancer Mother   . Brain cancer Mother   . Cancer - Colon Mother   . Cancer - Other Mother   . Heart Problems Father   . Colon cancer Brother   . Heart Problems Brother     Social History   Socioeconomic History  . Marital status: Divorced    Spouse name: Not on file  . Number of children: 1  . Years of education: Not on file  . Highest education level: Not on file  Occupational History  . Occupation: works in Secretary/administrator  Tobacco Use  . Smoking status: Never  . Smokeless tobacco: Never  Vaping Use  . Vaping Use: Never used  Substance and Sexual Activity  . Alcohol use: No  . Drug use: No  . Sexual activity: Not Currently    Birth  control/protection: Post-menopausal  Other Topics Concern  . Not on file  Social History Narrative   Son has schizophrenia and autism but is fully capable of helping mother after surgery   Social Determinants of Health   Financial Resource Strain: Not on file  Food Insecurity: Not on file  Transportation Needs: Not on file  Physical Activity: Not on file  Stress: Not on file  Social Connections: Not on file  Intimate Partner Violence: Not on file      Review of Systems  Constitutional:  Positive for fatigue. Negative for chills and unexpected weight change.  HENT:  Negative for congestion, rhinorrhea, sneezing and sore throat.   Eyes:  Negative for redness.  Respiratory:  Positive for cough, shortness of breath and wheezing. Negative for chest tightness.   Cardiovascular: Negative.  Negative for chest pain and palpitations.  Gastrointestinal:  Negative for abdominal pain, constipation, diarrhea, nausea and vomiting.  Genitourinary:  Negative for dysuria and frequency.  Musculoskeletal:  Negative for arthralgias, back pain, joint swelling and neck pain.  Skin:  Negative for rash.  Neurological: Negative.  Negative for tremors and numbness.  Hematological:  Negative for adenopathy. Does not bruise/bleed easily.  Psychiatric/Behavioral:  Negative for behavioral problems (Depression), sleep disturbance and suicidal ideas. The patient is not nervous/anxious.    Vital Signs: BP (!) 164/71   Pulse 62   Temp 97.6 F (36.4 C)   Resp 16   Ht 5\' 9"  (1.753 m)   Wt (!) 345 lb 3.2 oz (156.6 kg)   SpO2 98%   BMI 50.98 kg/m    Physical Exam Vitals reviewed.  Constitutional:      General: She is not in acute distress.    Appearance: Normal appearance. She is obese. She is not ill-appearing.  HENT:     Head: Normocephalic and atraumatic.  Eyes:     Extraocular Movements: Extraocular movements intact.     Pupils: Pupils are equal, round, and reactive to light.  Cardiovascular:      Rate and Rhythm: Normal rate and regular rhythm.  Pulmonary:     Effort: Pulmonary effort is normal. No respiratory distress.     Breath sounds: Wheezing present.  Neurological:     Mental Status: She is alert and oriented to person, place, and time.     Cranial Nerves: No cranial nerve deficit.     Coordination: Coordination normal.     Gait: Gait normal.  Psychiatric:        Mood and Affect: Mood normal.        Behavior: Behavior normal.       Assessment/Plan: 1. Type 2 diabetes mellitus with hyperglycemia, without long-term current use of insulin (HCC) Stable but patient had significant weight gain, trulicity dose increased.  - Dulaglutide (TRULICITY) 3 QQ/5.9DG SOPN; Inject 3 mg as directed once a week.  Dispense: 6 mL; Refill: 2  2. Essential hypertension Amlodipine dose increased , follow up in 4 weeks.  - amLODipine (NORVASC) 10 MG tablet; Take 1 tablet (10 mg total) by mouth daily.  Dispense: 90 tablet; Refill: 1  3. Chronic obstructive asthma with exacerbation ([employer]) Nebulizer ordered for home use and duoneb treatments as needed.  - For home use only DME Nebulizer machine - ipratropium-albuterol (DUONEB) 0.5-2.5 (3) MG/3ML SOLN; Take 3 mLs by nebulization every 4 (four) hours as needed (shortness of breath or wheezing).  Dispense: 360 mL; Refill: 3  4. Hair loss disorder Oral minoxidil prescribed.  - minoxidil (LONITEN) 10 MG tablet; Take 1 tablet (10 mg total) by mouth daily.  Dispense: 90 tablet; Refill: 2  5. Mixed hyperlipidemia Taking rosuvastatin. Recent lipid panel showed significant improvement.    General Counseling: San verbalizes understanding of the findings of todays visit and agrees with plan of treatment. I have discussed any further diagnostic evaluation that may be needed or ordered today. We also reviewed her medications today. she has been encouraged to call the office with any questions or concerns that should arise related to todays  visit.    Orders Placed This Encounter  Procedures  . For home use only DME Nebulizer machine    Meds ordered this encounter  Medications  . amLODipine (NORVASC) 10 MG tablet    Sig: Take 1 tablet (10 mg total) by mouth daily.    Dispense:  90 tablet    Refill:  1  . Dulaglutide (TRULICITY) 3 JS/4.1HR SOPN    Sig: Inject 3 mg as directed once a week.    Dispense:  6 mL    Refill:  2  . minoxidil (LONITEN) 10 MG tablet    Sig: Take 1 tablet (10 mg total) by mouth daily.    Dispense:  90 tablet    Refill:  2  . ipratropium-albuterol (DUONEB) 0.5-2.5 (3) MG/3ML SOLN    Sig: Take 3 mLs by nebulization every 4 (four) hours as needed (shortness of breath or wheezing).    Dispense:  360 mL    Refill:  3    Return in about 4 weeks (around 04/22/2021) for F/U, BP check, eval new med, Renesmay Nesbitt PCP.   Total time spent:30 Minutes Time spent includes review of chart, medications, test results, and follow up plan with the patient.   [place] Controlled Substance Database was reviewed by me.  This patient was seen by Jonetta Osgood, FNP-C in collaboration with Dr. Clayborn Bigness as a part of collaborative care agreement.   Auther Lyerly R. Valetta Fuller, MSN, FNP-C Internal medicine

## 2021-03-25 NOTE — Telephone Encounter (Signed)
Gave pt nebulizer at visit and informed [facility] pt that we gave pt nebulizer at visit and also order is in the epic

## 2021-03-28 ENCOUNTER — Ambulatory Visit
Payer: No Typology Code available for payment source | Admitting: Student in an Organized Health Care Education/Training Program

## 2021-03-29 ENCOUNTER — Encounter
Payer: No Typology Code available for payment source | Admitting: Student in an Organized Health Care Education/Training Program

## 2021-03-30 ENCOUNTER — Ambulatory Visit (HOSPITAL_BASED_OUTPATIENT_CLINIC_OR_DEPARTMENT_OTHER)
Payer: No Typology Code available for payment source | Admitting: Student in an Organized Health Care Education/Training Program

## 2021-03-30 ENCOUNTER — Ambulatory Visit
Admission: RE | Admit: 2021-03-30 | Discharge: 2021-03-30 | Disposition: A | Discharge status: Home / Self Care | Payer: No Typology Code available for payment source | Source: Ambulatory Visit | Attending: Student in an Organized Health Care Education/Training Program | Admitting: Student in an Organized Health Care Education/Training Program

## 2021-03-30 ENCOUNTER — Encounter: Payer: Self-pay | Admitting: Student in an Organized Health Care Education/Training Program

## 2021-03-30 ENCOUNTER — Other Ambulatory Visit: Payer: Self-pay

## 2021-03-30 VITALS — BP 152/80 | HR 66 | Temp 97.8°F | Resp 20 | Ht 69.0 in | Wt 325.0 lb

## 2021-03-30 DIAGNOSIS — G894 Chronic pain syndrome: Secondary | ICD-10-CM | POA: Diagnosis not present

## 2021-03-30 DIAGNOSIS — M47816 Spondylosis without myelopathy or radiculopathy, lumbar region: Secondary | ICD-10-CM | POA: Diagnosis not present

## 2021-03-30 MED ORDER — LIDOCAINE HCL 2 % IJ SOLN
INTRAMUSCULAR | Status: AC
  Filled 2021-03-30: qty 20

## 2021-03-30 MED ORDER — DIAZEPAM 5 MG PO TABS
10.0000 mg | ORAL_TABLET | Freq: Once | ORAL | Status: AC
  Administered 2021-03-30: 10 mg via ORAL

## 2021-03-30 MED ORDER — ROPIVACAINE HCL 2 MG/ML IJ SOLN
INTRAMUSCULAR | Status: AC
  Filled 2021-03-30: qty 20

## 2021-03-30 MED ORDER — ROPIVACAINE HCL 2 MG/ML IJ SOLN
9.0000 mL | Freq: Once | INTRAMUSCULAR | Status: AC
  Administered 2021-03-30: 20 mL via PERINEURAL

## 2021-03-30 MED ORDER — DEXAMETHASONE SODIUM PHOSPHATE 10 MG/ML IJ SOLN
10.0000 mg | Freq: Once | INTRAMUSCULAR | Status: AC
  Administered 2021-03-30: 10 mg

## 2021-03-30 MED ORDER — LIDOCAINE HCL 2 % IJ SOLN
20.0000 mL | Freq: Once | INTRAMUSCULAR | Status: AC
  Administered 2021-03-30: 400 mg

## 2021-03-30 MED ORDER — DEXAMETHASONE SODIUM PHOSPHATE 10 MG/ML IJ SOLN
INTRAMUSCULAR | Status: AC
  Filled 2021-03-30: qty 1

## 2021-03-30 MED ORDER — DIAZEPAM 5 MG PO TABS
ORAL_TABLET | ORAL | Status: AC
  Filled 2021-03-30: qty 2

## 2021-03-30 NOTE — Progress Notes (Signed)
PROVIDER NOTE: Information contained herein reflects review and annotations entered in association with encounter. Interpretation of such information and data should be left to medically-trained personnel. Information provided to patient can be located elsewhere in the medical record under "Patient Instructions". Document created using STT-dictation technology, any transcriptional errors that may result from process are unintentional.    Patient: Candace Clayton  Service Category: Procedure  Provider: Gillis Santa, MD  DOB: 09/27/1960  DOS: 03/30/2021  Location: [facility]  MRN: [id number]  Setting: Ambulatory - outpatient  Referring Provider: Lavera Guise, MD  Type: Established Patient  Specialty: Interventional Pain Management  PCP: Lavera Guise, MD   Primary Reason for Visit: Interventional Pain Management Treatment. CC: Back Pain (lower)    Procedure:          Anesthesia, Analgesia, Anxiolysis:  Type: Thermal Lumbar Facet, Medial Branch Radiofrequency Ablation/Neurotomy           Primary Purpose: Therapeutic Region: Posterolateral Lumbosacral Spine Level: L3, L4, L5, Medial Branch Level(s). These levels will denervate the L3-4, L4-5, lumbar facet joints. Laterality: LEFT  Type: Local Anesthesia with 10 mg PO Valium Local Anesthetic: Lidocaine 1-2% Sedation: Minimal Anxiolysis  Indication(s): Anxiety & Analgesia    Position: Prone   Indications: 1. Lumbar facet arthropathy    2. Lumbar spondylosis   3. Chronic pain syndrome    Candace Clayton has been dealing with the above chronic pain for longer than three months and has either failed to respond, was unable to tolerate, or simply did not get enough benefit from other more conservative therapies including, but not limited to: 1. Over-the-counter medications 2. Anti-inflammatory medications 3. Muscle relaxants 4. Membrane stabilizers 5. Opioids 6. Physical therapy and/or chiropractic manipulation 7. Modalities  (Heat, ice, etc.) 8. Invasive techniques such as nerve blocks. Candace Clayton has attained more than 50% relief of the pain from a series of diagnostic injections conducted in separate occasions.  Pain Score: Pre-procedure: 10-Worst pain ever/10 Post-procedure: 0-No pain/10    Pre-op H&P Assessment:  Candace Clayton is a 60 y.o. (year old), female patient, seen today for interventional treatment. She  has a past surgical history that includes Cesarean section (1992); Finger surgery (Left, 2011); Hematoma evacuation (Left, 1999); Colonoscopy; Laparoscopic salpingo oophorectomy (Left, 04/02/2018); Colonoscopy with propofol (N/A, 03/14/2019); and Cholecystectomy (10/26/2020). Candace Clayton has a current medication list which includes the following prescription(s): albuterol, amlodipine, breztri aerosphere, buspirone, desvenlafaxine, trulicity, ergocalciferol, fluticasone, contour next test, hydrochlorothiazide, ipratropium-albuterol, metoprolol succinate, microlet lancets, minoxidil, multiple vitamins-minerals, NON FORMULARY, OVER THE COUNTER MEDICATION, oxycodone, [START ON 04/05/2021] oxycodone-acetaminophen, [START ON 05/05/2021] oxycodone-acetaminophen, [START ON 06/04/2021] oxycodone-acetaminophen, pregabalin, rosuvastatin, and [DISCONTINUED] metoprolol tartrate. Her primarily concern today is the Back Pain (lower)  Initial Vital Signs:  Pulse/HCG Rate: 66ECG Heart Rate: 65 Temp: 97.8 F (36.6 C) Resp: 16 BP: 136/68 SpO2: 100 %  BMI: Estimated body mass index is 47.99 kg/m as calculated from the following:   Height as of this encounter: 5\' 9"  (1.753 m).   Weight as of this encounter: 325 lb (147.4 kg).  Risk Assessment: Allergies: Reviewed. She has No Known Allergies.  Allergy Precautions: None required Coagulopathies: Reviewed. None identified.  Blood-thinner therapy: None at this time Active Infection(s): Reviewed. None identified. Candace Clayton is afebrile  Site Confirmation: Candace Clayton was  asked to confirm the procedure and laterality before marking the site Procedure checklist: Completed Consent: Before the procedure and under the influence of no sedative(s), amnesic(s), or anxiolytics, the patient was informed of  the treatment options, risks and possible complications. To fulfill our ethical and legal obligations, as recommended by the American Medical Association's Code of Ethics, I have informed the patient of my clinical impression; the nature and purpose of the treatment or procedure; the risks, benefits, and possible complications of the intervention; the alternatives, including doing nothing; the risk(s) and benefit(s) of the alternative treatment(s) or procedure(s); and the risk(s) and benefit(s) of doing nothing. The patient was provided information about the general risks and possible complications associated with the procedure. These may include, but are not limited to: failure to achieve desired goals, infection, bleeding, organ or nerve damage, allergic reactions, paralysis, and death. In addition, the patient was informed of those risks and complications associated to Spine-related procedures, such as failure to decrease pain; infection (i.e.: Meningitis, epidural or intraspinal abscess); bleeding (i.e.: epidural hematoma, subarachnoid hemorrhage, or any other type of intraspinal or peri-dural bleeding); organ or nerve damage (i.e.: Any type of peripheral nerve, nerve root, or spinal cord injury) with subsequent damage to sensory, motor, and/or autonomic systems, resulting in permanent pain, numbness, and/or weakness of one or several areas of the body; allergic reactions; (i.e.: anaphylactic reaction); and/or death. Furthermore, the patient was informed of those risks and complications associated with the medications. These include, but are not limited to: allergic reactions (i.e.: anaphylactic or anaphylactoid reaction(s)); adrenal axis suppression; blood sugar elevation that in  diabetics may result in ketoacidosis or comma; water retention that in patients with history of congestive heart failure may result in shortness of breath, pulmonary edema, and decompensation with resultant heart failure; weight gain; swelling or edema; medication-induced neural toxicity; particulate matter embolism and blood vessel occlusion with resultant organ, and/or nervous system infarction; and/or aseptic necrosis of one or more joints. Finally, the patient was informed that Medicine is not an exact science; therefore, there is also the possibility of unforeseen or unpredictable risks and/or possible complications that may result in a catastrophic outcome. The patient indicated having understood very clearly. We have given the patient no guarantees and we have made no promises. Enough time was given to the patient to ask questions, all of which were answered to the patient's satisfaction. Candace Clayton has indicated that she wanted to continue with the procedure. Attestation: I, the ordering provider, attest that I have discussed with the patient the benefits, risks, side-effects, alternatives, likelihood of achieving goals, and potential problems during recovery for the procedure that I have provided informed consent. Date  Time: 03/30/2021  9:32 AM  Pre-Procedure Preparation:  Monitoring: As per clinic protocol. Respiration, ETCO2, SpO2, BP, heart rate and rhythm monitor placed and checked for adequate function Safety Precautions: Patient was assessed for positional comfort and pressure points before starting the procedure. Time-out: I initiated and conducted the "Time-out" before starting the procedure, as per protocol. The patient was asked to participate by confirming the accuracy of the "Time Out" information. Verification of the correct person, site, and procedure were performed and confirmed by me, the nursing staff, and the patient. "Time-out" conducted as per Joint Commission's Universal  Protocol (UP.01.01.01). Time: 1008  Description of Procedure:          Laterality: Left Levels: L3, L4, L5, Medial Branch Level(s), at the L3-4, L4-5 umbar facet joints. Area Prepped: Lumbosacral DuraPrep (Iodine Povacrylex [0.7% available iodine] and Isopropyl Alcohol, 74% w/w) Safety Precautions: Aspiration looking for blood return was conducted prior to all injections. At no point did we inject any substances, as a needle was being advanced.  Before injecting, the patient was told to immediately notify me if she was experiencing any new onset of "ringing in the ears, or metallic taste in the mouth". No attempts were made at seeking any paresthesias. Safe injection practices and needle disposal techniques used. Medications properly checked for expiration dates. SDV (single dose vial) medications used. After the completion of the procedure, all disposable equipment used was discarded in the proper designated medical waste containers. Local Anesthesia: Protocol guidelines were followed. The patient was positioned over the fluoroscopy table. The area was prepped in the usual manner. The time-out was completed. The target area was identified using fluoroscopy. A 12-in long, straight, sterile hemostat was used with fluoroscopic guidance to locate the targets for each level blocked. Once located, the skin was marked with an approved surgical skin marker. Once all sites were marked, the skin (epidermis, dermis, and hypodermis), as well as deeper tissues (fat, connective tissue and muscle) were infiltrated with a small amount of a short-acting local anesthetic, loaded on a 10cc syringe with a 25G, 1.5-in  Needle. An appropriate amount of time was allowed for local anesthetics to take effect before proceeding to the next step. Local Anesthetic: Lidocaine 2.0% The unused portion of the local anesthetic was discarded in the proper designated containers. Technical explanation of process:  Radiofrequency Ablation  (RFA) L3 Medial Branch Nerve RFA: The target area for the L3 medial branch is at the junction of the postero-lateral aspect of the superior articular process and the superior, posterior, and medial edge of the transverse process of L4. Under fluoroscopic guidance, a Radiofrequency needle was inserted until contact was made with os over the superior postero-lateral aspect of the pedicular shadow (target area). Sensory and motor testing was conducted to properly adjust the position of the needle. Once satisfactory placement of the needle was achieved, the numbing solution was slowly injected after negative aspiration for blood. 2.0 mL of the nerve block solution was injected without difficulty or complication. After waiting for at least 3 minutes, the ablation was performed. Once completed, the needle was removed intact. L4 Medial Branch Nerve RFA: The target area for the L4 medial branch is at the junction of the postero-lateral aspect of the superior articular process and the superior, posterior, and medial edge of the transverse process of L5. Under fluoroscopic guidance, a Radiofrequency needle was inserted until contact was made with os over the superior postero-lateral aspect of the pedicular shadow (target area). Sensory and motor testing was conducted to properly adjust the position of the needle. Once satisfactory placement of the needle was achieved, the numbing solution was slowly injected after negative aspiration for blood. 2.0 mL of the nerve block solution was injected without difficulty or complication. After waiting for at least 3 minutes, the ablation was performed. Once completed, the needle was removed intact. L5 Medial Branch Nerve RFA: The target area for the L5 medial branch is at the junction of the postero-lateral aspect of the superior articular process of S1 and the superior, posterior, and medial edge of the sacral ala. Under fluoroscopic guidance, a Radiofrequency needle was inserted  until contact was made with os over the superior postero-lateral aspect of the pedicular shadow (target area). Sensory and motor testing was conducted to properly adjust the position of the needle. Once satisfactory placement of the needle was achieved, the numbing solution was slowly injected after negative aspiration for blood. 2.0 mL of the nerve block solution was injected without difficulty or complication. After waiting for at least  3 minutes, the ablation was performed. Once completed, the needle was removed intact.  Radiofrequency lesioning (ablation):  Radiofrequency Generator: NeuroTherm [id number] Sensory Stimulation Parameters: 50 Hz was used to locate & identify the nerve, making sure that the needle was positioned such that there was no sensory stimulation below 0.3 V or above 0.7 V. Motor Stimulation Parameters: 2 Hz was used to evaluate the motor component. Care was taken not to lesion any nerves that demonstrated motor stimulation of the lower extremities at an output of less than 2.5 times that of the sensory threshold, or a maximum of 2.0 V. Lesioning Technique Parameters: Standard Radiofrequency settings. (Not bipolar or pulsed.) Temperature Settings: 80 degrees C Lesioning time: 60 seconds Intra-operative Compliance: Compliant Materials & Medications: Needle(s) (Electrode/Cannula) Type: Teflon-coated, curved tip, Radiofrequency needle(s) Gauge: 22G Length: 10cm Numbing solution: 6 cc solution made of 5 cc of 0.2% ropivacaine, 1 cc of Decadron 10 mg/cc. 2cc injected at each level above on left.   Once the entire procedure was completed, the treated area was cleaned, making sure to leave some of the prepping solution back to take advantage of its long term bactericidal properties.    Illustration of the posterior view of the lumbar spine and the posterior neural structures. Laminae of L2 through S1 are labeled. DPRL5, dorsal primary ramus of L5; DPRS1, dorsal primary ramus of S1;  DPR3, dorsal primary ramus of L3; FJ, facet (zygapophyseal) joint L3-L4; I, inferior articular process of L4; LB1, lateral branch of dorsal primary ramus of L1; IAB, inferior articular branches from L3 medial branch (supplies L4-L5 facet joint); IBP, intermediate branch plexus; MB3, medial branch of dorsal primary ramus of L3; NR3, third lumbar nerve root; S, superior articular process of L5; SAB, superior articular branches from L4 (supplies L4-5 facet joint also); TP3, transverse process of L3.  Vitals:   03/30/21 1010 03/30/21 1015 03/30/21 1020 03/30/21 1025  BP: (!) 143/80 (!) 142/77 (!) 152/76 (!) 152/80  Pulse:      Resp: 18 17 19 20   Temp:      TempSrc:      SpO2: 98% 97% 98% 97%  Weight:      Height:       Start Time: 1008 hrs. End Time: 1027 hrs.  Imaging Guidance (Spinal):          Type of Imaging Technique: Fluoroscopy Guidance (Spinal) Indication(s): Assistance in needle guidance and placement for procedures requiring needle placement in or near specific anatomical locations not easily accessible without such assistance. Exposure Time: Please see nurses notes. Contrast: None used. Fluoroscopic Guidance: I was personally present during the use of fluoroscopy. "Tunnel Vision Technique" used to obtain the best possible view of the target area. Parallax error corrected before commencing the procedure. "Direction-depth-direction" technique used to introduce the needle under continuous pulsed fluoroscopy. Once target was reached, antero-posterior, oblique, and lateral fluoroscopic projection used confirm needle placement in all planes. Images permanently stored in EMR. Interpretation: No contrast injected. I personally interpreted the imaging intraoperatively. Adequate needle placement confirmed in multiple planes. Permanent images saved into the patient's record.   Post-operative Assessment:  Post-procedure Vital Signs:  Pulse/HCG Rate: 6667 Temp: 97.8 F (36.6 C) Resp: 20 BP:  (!) 152/80 SpO2: 97 %  EBL: None  Complications: No immediate post-treatment complications observed by team, or reported by patient.  Note: The patient tolerated the entire procedure well. A repeat set of vitals were taken after the procedure and the patient was kept under observation following institutional policy,  for this type of procedure. Post-procedural neurological assessment was performed, showing return to baseline, prior to discharge. The patient was provided with post-procedure discharge instructions, including a section on how to identify potential problems. Should any problems arise concerning this procedure, the patient was given instructions to immediately contact us, at any time, without hesitation. In any case, we plan to contact the patient by telephone for a follow-up status report regarding this interventional procedure.  Comments:  No additional relevant information.  Plan of Care  Orders:  Orders Placed This Encounter  Procedures   Radiofrequency,Lumbar    Standing Status:   Future    Standing Expiration Date:   09/28/2021    Scheduling Instructions:     Side(s): RIGHT     Level(s): L3, L4, L5, Medial Branch Nerve(s)     Sedation: With PO Valium     Scheduling Timeframe: As soon as pre-approved    Order Specific Question:   Where will this procedure be performed?    Answer:   [facility]   DG [facility] C-ARM 1-60 MIN NO REPORT    Intraoperative interpretation by procedural physician at [place].    Standing Status:   Standing    Number of Occurrences:   1    Order Specific Question:   Reason for exam:    Answer:   Assistance in needle guidance and placement for procedures requiring needle placement in or near specific anatomical locations not easily accessible without such assistance.   Chronic Opioid Analgesic:  Percocet 5 mg 3 times daily as needed, quantity 90/month; MME equals 22.5    Medications ordered for procedure: Meds ordered  this encounter  Medications   lidocaine (XYLOCAINE) 2 % (with pres) injection 400 mg   dexamethasone (DECADRON) injection 10 mg   ropivacaine (PF) 2 mg/mL (0.2%) (NAROPIN) injection 9 mL   diazepam (VALIUM) tablet 10 mg    Make sure Flumazenil is available in the pyxis when using this medication. If oversedation occurs, administer 0.2 mg IV over 15 sec. If after 45 sec no response, administer 0.2 mg again over 1 min; may repeat at 1 min intervals; not to exceed 4 doses (1 mg)   Medications administered: We administered lidocaine, dexamethasone, ropivacaine (PF) 2 mg/mL (0.2%), and diazepam.  See the medical record for exact dosing, route, and time of administration.  Follow-up plan:   Return in about 2 weeks (around 04/13/2021) for R L3, 4, 5, RFA, minimal sedation (PO Valium).       Status post diagnostic bilateral L3, L4, L5, S1 facet medial branch nerve blocks, status post right L3, L4, L5, S1 RFA on 05/05/2019, 04/15/20; left L3, L4, L5 RFA 05/03/2020.  Left L3,4,5 RFA 03/30/21       Recent Visits Date Type Provider Dept  03/17/21 Office Visit Gillis Santa, MD [facility]  Showing recent visits within past 90 days and meeting all other requirements Today's Visits Date Type Provider Dept  03/30/21 Procedure visit Gillis Santa, MD [facility]  Showing today's visits and meeting all other requirements Future Appointments Date Type Provider Dept  04/18/21 Appointment Gillis Santa, MD [facility]  Showing future appointments within next 90 days and meeting all other requirements Disposition: Discharge home  Discharge (Date  Time): 03/30/2021; 1045 hrs.   Primary Care Physician: Lavera Guise, MD Location: [facility] Note by: Gillis Santa, MD Date: 03/30/2021; Time: 10:42 AM  Disclaimer:  Medicine is not an exact  science. The only guarantee in medicine is that nothing is guaranteed. It is important to note that the decision  to proceed with this intervention was based on the information collected from the patient. The Data and conclusions were drawn from the patient's questionnaire, the interview, and the physical examination. Because the information was provided in large part by the patient, it cannot be guaranteed that it has not been purposely or unconsciously manipulated. Every effort has been made to obtain as much relevant data as possible for this evaluation. It is important to note that the conclusions that lead to this procedure are derived in large part from the available data. Always take into account that the treatment will also be dependent on availability of resources and existing treatment guidelines, considered by other Pain Management Practitioners as being common knowledge and practice, at the time of the intervention. For Medico-Legal purposes, it is also important to point out that variation in procedural techniques and pharmacological choices are the acceptable norm. The indications, contraindications, technique, and results of the above procedure should only be interpreted and judged by a Board-Certified Interventional Pain Specialist with extensive familiarity and expertise in the same exact procedure and technique.

## 2021-03-30 NOTE — Patient Instructions (Addendum)
____________________________________________________________________________________________  Post-procedure Information What to expect: Most procedures involve the use of a local anesthetic (numbing medicine), and a steroid (anti-inflammatory medicine).  The local anesthetics may cause temporary numbness and weakness of the legs or arms, depending on the location of the block. This numbness/weakness may last 4-6 hours, depending on the local anesthetic used. In rare instances, it can last up to 24 hours. While numb, you must be very careful not to injure the extremity.  After any procedure, you could expect the pain to get better within 15-20 minutes. This relief is temporary and may last 4-6 hours. Once the local anesthetics wears off, you could experience discomfort, possibly more than usual, for up to 10 (ten) days. In the case of radiofrequencies, it may last up to 6 weeks. Surgeries may take up to 8 weeks for the healing process. The discomfort is due to the irritation caused by needles going through skin and muscle. To minimize the discomfort, we recommend using ice the first day, and heat from then on. The ice should be applied for 15 minutes on, and 15 minutes off. Keep repeating this cycle until bedtime. Avoid applying the ice directly to the skin, to prevent frostbite. Heat should be used daily, until the pain improves (4-10 days). Be careful not to burn yourself.  Occasionally you may experience muscle spasms or cramps. These occur as a consequence of the irritation caused by the needle sticks to the muscle and the blood that will inevitably be lost into the surrounding muscle tissue. Blood tends to be very irritating to tissues, which tend to react by going into spasm. These spasms may start the same day of your procedure, but they may also take days to develop. This late onset type of spasm or cramp is usually caused by electrolyte imbalances triggered by the steroids, at the level of the  kidney. Cramps and spasms tend to respond well to muscle relaxants, multivitamins (some are triggered by the procedure, but may have their origins in vitamin deficiencies), and "Gatorade", or any sports drinks that can replenish any electrolyte imbalances. (If you are a diabetic, ask your pharmacist to get you a sugar-free brand.) Warm showers or baths may also be helpful. Stretching exercises are highly recommended.  General Instructions:  Be alert for signs of possible infection: redness, swelling, heat, red streaks, elevated temperature, and/or fever. These typically appear 4 to 6 days after the procedure. Immediately notify your doctor if you experience unusual bleeding, difficulty breathing, or loss of bowel or bladder control. If you experience increased pain, do not increase your pain medicine intake, unless instructed by your pain physician.  Post-Procedure Care:  Be careful in moving about. Muscle spasms in the area of the injection may occur. Applying ice or heat to the area is often helpful. The incidence of spinal headaches after epidural injections ranges between 1.4% and 6%. If you develop a headache that does not seem to respond to conservative therapy, please let your physician know. This can be treated with an epidural blood patch.   Post-procedure numbness or redness is to be expected, however it should average 4 to 6 hours. If numbness and weakness of your extremities begins to develop 4 to 6 hours after your procedure, and is felt to be progressing and worsening, immediately contact your physician.  Diet:  If you experience nausea, do not eat until this sensation goes away. If you had a "Stellate Ganglion Block" for upper extremity "Reflex Sympathetic Dystrophy", do not eat or   drink until your hoarseness goes away. In any case, always start with liquids first and if you tolerate them well, then slowly progress to more solid foods.  Activity:  For the first 4 to 6 hours after the  procedure, use caution in moving about as you may experience numbness and/or weakness. Use caution in cooking, using household electrical appliances, and climbing steps. If you need to reach your Doctor call our office: [phone] (During business hours) or [phone] (After business hours).  Business Hours: Monday-Thursday 8:00 am - 4:00 PM    Fridays: Closed     In case of an emergency: In case of emergency, call 911 or go to the nearest emergency room and have the physician there call us.  Interpretation of Procedure Every nerve block has two components: a diagnostic component, and a treatment component. Unrealistic expectations are the most common causes of "perceived failure".  In a perfect world, a single nerve block should be able to completely and permanently eliminate the pain. Sadly, the world is not perfect.  Most pain management nerve blocks are performed using local anesthetics and steroids. Steroids are responsible for any long-term benefit that you may experience. Their purpose is to decrease any chronic swelling that may exist in the area. Steroids begin to work immediately after being injected. However, most patients will not experience any benefits until 5 to 10 days after the injection, when the swelling has come down to the point where they can tell a difference. Steroids will only help if there is swelling to be treated. As such, they can assist with the diagnosis. If effective, they suggest an inflammatory component to the pain, and if ineffective, they rule out inflammation as the main cause or component of the problem. If the problem is one of mechanical compression, you will get no benefit from those steroids.   In the case of local anesthetics, they have a crucial role in the diagnosis of your condition. Most will begin to work within15 to 20 minutes after injection. The duration will depend on the type used (short- vs. Long-acting). It is of outmost importance that  patients keep tract of their pain, after the procedure. To assist with this matter, a "Post-procedure Pain Diary" is provided. Make sure to complete it and to bring it back to your follow-up appointment.  As long as the patient keeps accurate, detailed records of their symptoms after every procedure, and returns to have those interpreted, every procedure will provide us with invaluable information. Even a block that does not provide the patient with any relief, will always provide us with information about the mechanism and the origin of the pain. The only time a nerve block can be considered a waste of time is when patients do not keep track of the results, or do not keep their post-procedure appointment.  Reporting the results back to your physician The Pain Score  Pain is a subjective complaint. It cannot be seen, touched, or measured. We depend entirely on the patient's report of the pain in order to assess your condition and treatment. To evaluate the pain, we use a pain scale, where "0" means "No Pain", and a "10" is "the worst possible pain that you can even imagine" (i.e. something like been eaten alive by a shark or being torn apart by a lion).   Use the Pain Scale provided. You will frequently be asked to rate your pain. Please be accurate, remember that medical decisions will be based on your   responses. Please do not rate your pain above a 10. Doing so is actually interpreted as "symptom magnification" (exaggeration). To put this into perspective, when you tell us that your pain is at a 10 (ten), what you are saying is that there is nothing we can do to make this pain any worse. (Carefully think about that.) ____________________________________________________________________________________________  ______________________________________________________________________  Preparing for your procedure (without sedation)  Procedure appointments are limited to planned procedures: No  Prescription Refills. No disability issues will be discussed. No medication changes will be discussed.  Instructions: Oral Intake: Do not eat or drink anything for at least 6 hours prior to your procedure. (Exception: Blood Pressure Medication. See below.) Transportation: Unless otherwise stated by your physician, you may drive yourself after the procedure. Blood Pressure Medicine: Do not forget to take your blood pressure medicine with a sip of water the morning of the procedure. If your Diastolic (lower reading)is above 100 mmHg, elective cases will be cancelled/rescheduled. Blood thinners: These will need to be stopped for procedures. Notify our staff if you are taking any blood thinners. Depending on which one you take, there will be specific instructions on how and when to stop it. Diabetics on insulin: Notify the staff so that you can be scheduled 1st case in the morning. If your diabetes requires high dose insulin, take only  of your normal insulin dose the morning of the procedure and notify the staff that you have done so. Preventing infections: Shower with an antibacterial soap the morning of your procedure.  Build-up your immune system: Take 1000 mg of Vitamin C with every meal (3 times a day) the day prior to your procedure. Antibiotics: Inform the staff if you have a condition or reason that requires you to take antibiotics before dental procedures. Pregnancy: If you are pregnant, call and cancel the procedure. Sickness: If you have a cold, fever, or any active infections, call and cancel the procedure. Arrival: You must be in the facility at least 30 minutes prior to your scheduled procedure. Children: Do not bring any children with you. Dress appropriately: Bring dark clothing that you would not mind if they get stained. Valuables: Do not bring any jewelry or valuables.  Reasons to call and reschedule or cancel your procedure: (Following these recommendations will minimize the  risk of a serious complication.) Surgeries: Avoid having procedures within 2 weeks of any surgery. (Avoid for 2 weeks before or after any surgery). Flu Shots: Avoid having procedures within 2 weeks of a flu shots or . (Avoid for 2 weeks before or after immunizations). Barium: Avoid having a procedure within 7-10 days after having had a radiological study involving the use of radiological contrast. (Myelograms, Barium swallow or enema study). Heart attacks: Avoid any elective procedures or surgeries for the initial 6 months after a "Myocardial Infarction" (Heart Attack). Blood thinners: It is imperative that you stop these medications before procedures. Let us know if you if you take any blood thinner.  Infection: Avoid procedures during or within two weeks of an infection (including chest colds or gastrointestinal problems). Symptoms associated with infections include: Localized redness, fever, chills, night sweats or profuse sweating, burning sensation when voiding, cough, congestion, stuffiness, runny nose, sore throat, diarrhea, nausea, vomiting, cold or Flu symptoms, recent or current infections. It is specially important if the infection is over the area that we intend to treat. Heart and lung problems: Symptoms that may suggest an active cardiopulmonary problem include: cough, chest pain, breathing difficulties or shortness of  breath, dizziness, ankle swelling, uncontrolled high or unusually low blood pressure, and/or palpitations. If you are experiencing any of these symptoms, cancel your procedure and contact your primary care physician for an evaluation.  Remember:  Regular Business hours are:  Monday to Thursday 8:00 AM to 4:00 PM  Provider's Schedule: Milinda Pointer, MD:  Procedure days: Tuesday and Thursday 7:30 AM to 4:00 PM  Gillis Santa, MD:  Procedure days: Monday and Wednesday 7:30 AM to 4:00 PM ______________________________________________________________________

## 2021-03-30 NOTE — Progress Notes (Signed)
Safety precautions to be maintained throughout the outpatient stay will include: orient to surroundings, keep bed in low position, maintain call bell within reach at all times, provide assistance with transfer out of bed and ambulation.  

## 2021-03-31 ENCOUNTER — Telehealth: Payer: Self-pay | Admitting: *Deleted

## 2021-03-31 NOTE — Telephone Encounter (Signed)
Called for post procedure check. No answer. LVM. 

## 2021-04-18 ENCOUNTER — Ambulatory Visit
Admission: RE | Admit: 2021-04-18 | Discharge: 2021-04-18 | Disposition: A | Discharge status: Home / Self Care | Payer: No Typology Code available for payment source | Source: Ambulatory Visit | Attending: Student in an Organized Health Care Education/Training Program | Admitting: Student in an Organized Health Care Education/Training Program

## 2021-04-18 ENCOUNTER — Other Ambulatory Visit: Payer: Self-pay

## 2021-04-18 ENCOUNTER — Ambulatory Visit (HOSPITAL_BASED_OUTPATIENT_CLINIC_OR_DEPARTMENT_OTHER)
Payer: No Typology Code available for payment source | Admitting: Student in an Organized Health Care Education/Training Program

## 2021-04-18 ENCOUNTER — Encounter: Payer: Self-pay | Admitting: Student in an Organized Health Care Education/Training Program

## 2021-04-18 VITALS — BP 155/91 | HR 75 | Temp 97.2°F | Resp 20 | Ht 69.0 in | Wt 325.0 lb

## 2021-04-18 DIAGNOSIS — M47816 Spondylosis without myelopathy or radiculopathy, lumbar region: Secondary | ICD-10-CM | POA: Diagnosis present

## 2021-04-18 DIAGNOSIS — G894 Chronic pain syndrome: Secondary | ICD-10-CM

## 2021-04-18 MED ORDER — LIDOCAINE HCL 2 % IJ SOLN
20.0000 mL | Freq: Once | INTRAMUSCULAR | Status: AC
  Administered 2021-04-18: 200 mg

## 2021-04-18 MED ORDER — DIAZEPAM 5 MG PO TABS
10.0000 mg | ORAL_TABLET | ORAL | Status: AC
  Administered 2021-04-18: 10 mg via ORAL

## 2021-04-18 MED ORDER — DEXAMETHASONE SODIUM PHOSPHATE 10 MG/ML IJ SOLN
10.0000 mg | Freq: Once | INTRAMUSCULAR | Status: AC
  Administered 2021-04-18: 10 mg

## 2021-04-18 MED ORDER — ROPIVACAINE HCL 2 MG/ML IJ SOLN
9.0000 mL | Freq: Once | INTRAMUSCULAR | Status: AC
  Administered 2021-04-18: 20 mL via PERINEURAL

## 2021-04-18 MED ORDER — DIAZEPAM 5 MG PO TABS
ORAL_TABLET | ORAL | Status: AC
  Filled 2021-04-18: qty 2

## 2021-04-18 MED ORDER — DEXAMETHASONE SODIUM PHOSPHATE 10 MG/ML IJ SOLN
INTRAMUSCULAR | Status: AC
  Filled 2021-04-18: qty 1

## 2021-04-18 NOTE — Patient Instructions (Signed)

## 2021-04-18 NOTE — Progress Notes (Signed)
Safety precautions to be maintained throughout the outpatient stay will include: orient to surroundings, keep bed in low position, maintain call bell within reach at all times, provide assistance with transfer out of bed and ambulation.  

## 2021-04-18 NOTE — Progress Notes (Signed)
PROVIDER NOTE: Information contained herein reflects review and annotations entered in association with encounter. Interpretation of such information and data should be left to medically-trained personnel. Information provided to patient can be located elsewhere in the medical record under "Patient Instructions". Document created using STT-dictation technology, any transcriptional errors that may result from process are unintentional.    Patient: Candace Clayton  Service Category: Procedure  Provider: Gillis Santa, MD  DOB: 1960/07/14  DOS: 04/18/2021  Location: [facility]  MRN: [id number]  Setting: Ambulatory - outpatient  Referring Provider: Lavera Guise, MD  Type: Established Patient  Specialty: Interventional Pain Management  PCP: Lavera Guise, MD   Primary Reason for Visit: Interventional Pain Management Treatment. CC: Back Pain (Lumbar right side)    Procedure:          Anesthesia, Analgesia, Anxiolysis:  Type: Thermal Lumbar Facet, Medial Branch Radiofrequency Ablation/Neurotomy           Primary Purpose: Therapeutic Region: Posterolateral Lumbosacral Spine Level: L3, L4, L5, Medial Branch Level(s). These levels will denervate the L3-4, L4-5, lumbar facet joints. Laterality: RIGHT  Type: Local Anesthesia with 10 mg PO Valium Local Anesthetic: Lidocaine 1-2% Sedation: Minimal Anxiolysis  Indication(s): Anxiety & Analgesia    Position: Prone   Indications: 1. Lumbar facet arthropathy    2. Lumbar spondylosis   3. Chronic pain syndrome    Candace Clayton has been dealing with the above chronic pain for longer than three months and has either failed to respond, was unable to tolerate, or simply did not get enough benefit from other more conservative therapies including, but not limited to: 1. Over-the-counter medications 2. Anti-inflammatory medications 3. Muscle relaxants 4. Membrane stabilizers 5. Opioids 6. Physical therapy and/or chiropractic manipulation 7.  Modalities (Heat, ice, etc.) 8. Invasive techniques such as nerve blocks. Candace Clayton has attained more than 50% relief of the pain from a series of diagnostic injections conducted in separate occasions.  Pain Score: Pre-procedure: 2 /10 Post-procedure: 0-No pain/10    Pre-op H&P Assessment:  [name] is a 60 y.o. (year old), female patient, seen today for interventional treatment. She  has a past surgical history that includes Cesarean section (1992); Finger surgery (Left, 2011); Hematoma evacuation (Left, 1999); Colonoscopy; Laparoscopic salpingo oophorectomy (Left, 04/02/2018); Colonoscopy with propofol (N/A, 03/14/2019); and Cholecystectomy (10/26/2020). Candace Clayton has a current medication list which includes the following prescription(s): albuterol, amlodipine, breztri aerosphere, buspirone, desvenlafaxine, trulicity, ergocalciferol, fluticasone, contour next test, hydrochlorothiazide, ipratropium-albuterol, metoprolol succinate, microlet lancets, minoxidil, multiple vitamins-minerals, NON FORMULARY, OVER THE COUNTER MEDICATION, oxycodone, oxycodone-acetaminophen, [START ON 05/05/2021] oxycodone-acetaminophen, [START ON 06/04/2021] oxycodone-acetaminophen, pregabalin, rosuvastatin, and [DISCONTINUED] metoprolol tartrate. Her primarily concern today is the Back Pain (Lumbar right side)  Initial Vital Signs:  Pulse/HCG Rate: 75ECG Heart Rate: 73 Temp: (!) 97.2 F (36.2 C) Resp: 16 BP: (!) 148/72 SpO2: 99 %  BMI: Estimated body mass index is 47.99 kg/m as calculated from the following:   Height as of this encounter: 5\' 9"  (1.753 m).   Weight as of this encounter: 325 lb (147.4 kg).  Risk Assessment: Allergies: Reviewed. She has No Known Allergies.  Allergy Precautions: None required Coagulopathies: Reviewed. None identified.  Blood-thinner therapy: None at this time Active Infection(s): Reviewed. None identified. Candace Clayton is afebrile  Site Confirmation: Candace Clayton was asked to  confirm the procedure and laterality before marking the site Procedure checklist: Completed Consent: Before the procedure and under the influence of no sedative(s), amnesic(s), or anxiolytics, the patient was  informed of the treatment options, risks and possible complications. To fulfill our ethical and legal obligations, as recommended by the American Medical Association's Code of Ethics, I have informed the patient of my clinical impression; the nature and purpose of the treatment or procedure; the risks, benefits, and possible complications of the intervention; the alternatives, including doing nothing; the risk(s) and benefit(s) of the alternative treatment(s) or procedure(s); and the risk(s) and benefit(s) of doing nothing. The patient was provided information about the general risks and possible complications associated with the procedure. These may include, but are not limited to: failure to achieve desired goals, infection, bleeding, organ or nerve damage, allergic reactions, paralysis, and death. In addition, the patient was informed of those risks and complications associated to Spine-related procedures, such as failure to decrease pain; infection (i.e.: Meningitis, epidural or intraspinal abscess); bleeding (i.e.: epidural hematoma, subarachnoid hemorrhage, or any other type of intraspinal or peri-dural bleeding); organ or nerve damage (i.e.: Any type of peripheral nerve, nerve root, or spinal cord injury) with subsequent damage to sensory, motor, and/or autonomic systems, resulting in permanent pain, numbness, and/or weakness of one or several areas of the body; allergic reactions; (i.e.: anaphylactic reaction); and/or death. Furthermore, the patient was informed of those risks and complications associated with the medications. These include, but are not limited to: allergic reactions (i.e.: anaphylactic or anaphylactoid reaction(s)); adrenal axis suppression; blood sugar elevation that in diabetics  may result in ketoacidosis or comma; water retention that in patients with history of congestive heart failure may result in shortness of breath, pulmonary edema, and decompensation with resultant heart failure; weight gain; swelling or edema; medication-induced neural toxicity; particulate matter embolism and blood vessel occlusion with resultant organ, and/or nervous system infarction; and/or aseptic necrosis of one or more joints. Finally, the patient was informed that Medicine is not an exact science; therefore, there is also the possibility of unforeseen or unpredictable risks and/or possible complications that may result in a catastrophic outcome. The patient indicated having understood very clearly. We have given the patient no guarantees and we have made no promises. Enough time was given to the patient to ask questions, all of which were answered to the patient's satisfaction. Ms. Mastrangelo has indicated that she wanted to continue with the procedure. Attestation: I, the ordering provider, attest that I have discussed with the patient the benefits, risks, side-effects, alternatives, likelihood of achieving goals, and potential problems during recovery for the procedure that I have provided informed consent. Date  Time: 04/18/2021  9:22 AM  Pre-Procedure Preparation:  Monitoring: As per clinic protocol. Respiration, ETCO2, SpO2, BP, heart rate and rhythm monitor placed and checked for adequate function Safety Precautions: Patient was assessed for positional comfort and pressure points before starting the procedure. Time-out: I initiated and conducted the "Time-out" before starting the procedure, as per protocol. The patient was asked to participate by confirming the accuracy of the "Time Out" information. Verification of the correct person, site, and procedure were performed and confirmed by me, the nursing staff, and the patient. "Time-out" conducted as per Joint Commission's Universal Protocol  (UP.01.01.01). Time: 0949  Description of Procedure:          Laterality: Right Levels: L3, L4, L5, Medial Branch Level(s), at the L3-4, L4-5 umbar facet joints. Area Prepped: Lumbosacral DuraPrep (Iodine Povacrylex [0.7% available iodine] and Isopropyl Alcohol, 74% w/w) Safety Precautions: Aspiration looking for blood return was conducted prior to all injections. At no point did we inject any substances, as a needle was  being advanced. Before injecting, the patient was told to immediately notify me if she was experiencing any new onset of "ringing in the ears, or metallic taste in the mouth". No attempts were made at seeking any paresthesias. Safe injection practices and needle disposal techniques used. Medications properly checked for expiration dates. SDV (single dose vial) medications used. After the completion of the procedure, all disposable equipment used was discarded in the proper designated medical waste containers. Local Anesthesia: Protocol guidelines were followed. The patient was positioned over the fluoroscopy table. The area was prepped in the usual manner. The time-out was completed. The target area was identified using fluoroscopy. A 12-in long, straight, sterile hemostat was used with fluoroscopic guidance to locate the targets for each level blocked. Once located, the skin was marked with an approved surgical skin marker. Once all sites were marked, the skin (epidermis, dermis, and hypodermis), as well as deeper tissues (fat, connective tissue and muscle) were infiltrated with a small amount of a short-acting local anesthetic, loaded on a 10cc syringe with a 25G, 1.5-in  Needle. An appropriate amount of time was allowed for local anesthetics to take effect before proceeding to the next step. Local Anesthetic: Lidocaine 2.0% The unused portion of the local anesthetic was discarded in the proper designated containers. Technical explanation of process:  Radiofrequency Ablation (RFA) L3  Medial Branch Nerve RFA: The target area for the L3 medial branch is at the junction of the postero-lateral aspect of the superior articular process and the superior, posterior, and medial edge of the transverse process of L4. Under fluoroscopic guidance, a Radiofrequency needle was inserted until contact was made with os over the superior postero-lateral aspect of the pedicular shadow (target area). Sensory and motor testing was conducted to properly adjust the position of the needle. Once satisfactory placement of the needle was achieved, the numbing solution was slowly injected after negative aspiration for blood. 2.0 mL of the nerve block solution was injected without difficulty or complication. After waiting for at least 3 minutes, the ablation was performed. Once completed, the needle was removed intact. L4 Medial Branch Nerve RFA: The target area for the L4 medial branch is at the junction of the postero-lateral aspect of the superior articular process and the superior, posterior, and medial edge of the transverse process of L5. Under fluoroscopic guidance, a Radiofrequency needle was inserted until contact was made with os over the superior postero-lateral aspect of the pedicular shadow (target area). Sensory and motor testing was conducted to properly adjust the position of the needle. Once satisfactory placement of the needle was achieved, the numbing solution was slowly injected after negative aspiration for blood. 2.0 mL of the nerve block solution was injected without difficulty or complication. After waiting for at least 3 minutes, the ablation was performed. Once completed, the needle was removed intact. L5 Medial Branch Nerve RFA: The target area for the L5 medial branch is at the junction of the postero-lateral aspect of the superior articular process of S1 and the superior, posterior, and medial edge of the sacral ala. Under fluoroscopic guidance, a Radiofrequency needle was inserted until  contact was made with os over the superior postero-lateral aspect of the pedicular shadow (target area). Sensory and motor testing was conducted to properly adjust the position of the needle. Once satisfactory placement of the needle was achieved, the numbing solution was slowly injected after negative aspiration for blood. 2.0 mL of the nerve block solution was injected without difficulty or complication. After waiting for  at least 3 minutes, the ablation was performed. Once completed, the needle was removed intact.  Radiofrequency lesioning (ablation):  Radiofrequency Generator: NeuroTherm [id number] Sensory Stimulation Parameters: 50 Hz was used to locate & identify the nerve, making sure that the needle was positioned such that there was no sensory stimulation below 0.3 V or above 0.7 V. Motor Stimulation Parameters: 2 Hz was used to evaluate the motor component. Care was taken not to lesion any nerves that demonstrated motor stimulation of the lower extremities at an output of less than 2.5 times that of the sensory threshold, or a maximum of 2.0 V. Lesioning Technique Parameters: Standard Radiofrequency settings. (Not bipolar or pulsed.) Temperature Settings: 80 degrees C Lesioning time: 60 seconds Intra-operative Compliance: Compliant Materials & Medications: Needle(s) (Electrode/Cannula) Type: Teflon-coated, curved tip, Radiofrequency needle(s) Gauge: 22G Length: 10cm Numbing solution: 6 cc solution made of 5 cc of 0.2% ropivacaine, 1 cc of Decadron 10 mg/cc. 2cc injected at each level above on RIGHT.    Once the entire procedure was completed, the treated area was cleaned, making sure to leave some of the prepping solution back to take advantage of its long term bactericidal properties.    Illustration of the posterior view of the lumbar spine and the posterior neural structures. Laminae of L2 through S1 are labeled. DPRL5, dorsal primary ramus of L5; DPRS1, dorsal primary ramus of S1; DPR3,  dorsal primary ramus of L3; FJ, facet (zygapophyseal) joint L3-L4; I, inferior articular process of L4; LB1, lateral branch of dorsal primary ramus of L1; IAB, inferior articular branches from L3 medial branch (supplies L4-L5 facet joint); IBP, intermediate branch plexus; MB3, medial branch of dorsal primary ramus of L3; NR3, third lumbar nerve root; S, superior articular process of L5; SAB, superior articular branches from L4 (supplies L4-5 facet joint also); TP3, transverse process of L3.  Vitals:   04/18/21 0950 04/18/21 0955 04/18/21 1000 04/18/21 1005  BP: (!) 153/87 (!) 158/95 (!) 151/91 (!) 155/91  Pulse:      Resp: 17 20 20 20   Temp:      TempSrc:      SpO2: 99% 97% 96% 96%  Weight:      Height:       Start Time: 0950 hrs. End Time: 1006 hrs.  Imaging Guidance (Spinal):          Type of Imaging Technique: Fluoroscopy Guidance (Spinal) Indication(s): Assistance in needle guidance and placement for procedures requiring needle placement in or near specific anatomical locations not easily accessible without such assistance. Exposure Time: Please see nurses notes. Contrast: None used. Fluoroscopic Guidance: I was personally present during the use of fluoroscopy. "Tunnel Vision Technique" used to obtain the best possible view of the target area. Parallax error corrected before commencing the procedure. "Direction-depth-direction" technique used to introduce the needle under continuous pulsed fluoroscopy. Once target was reached, antero-posterior, oblique, and lateral fluoroscopic projection used confirm needle placement in all planes. Images permanently stored in EMR. Interpretation: No contrast injected. I personally interpreted the imaging intraoperatively. Adequate needle placement confirmed in multiple planes. Permanent images saved into the patient's record.   Post-operative Assessment:  Post-procedure Vital Signs:  Pulse/HCG Rate: 7573 Temp: (!) 97.2 F (36.2 C) Resp: 20 BP:  (!) 155/91 SpO2: 96 %  EBL: None  Complications: No immediate post-treatment complications observed by team, or reported by patient.  Note: The patient tolerated the entire procedure well. A repeat set of vitals were taken after the procedure and the patient was kept under  observation following institutional policy, for this type of procedure. Post-procedural neurological assessment was performed, showing return to baseline, prior to discharge. The patient was provided with post-procedure discharge instructions, including a section on how to identify potential problems. Should any problems arise concerning this procedure, the patient was given instructions to immediately contact us, at any time, without hesitation. In any case, we plan to contact the patient by telephone for a follow-up status report regarding this interventional procedure.  Comments:  No additional relevant information.  Plan of Care  Orders:  Orders Placed This Encounter  Procedures   DG PAIN [facility] C-ARM 1-60 MIN NO REPORT    Intraoperative interpretation by procedural physician at [place].    Standing Status:   Standing    Number of Occurrences:   1    Order Specific Question:   Reason for exam:    Answer:   Assistance in needle guidance and placement for procedures requiring needle placement in or near specific anatomical locations not easily accessible without such assistance.   Chronic Opioid Analgesic:  Percocet 5 mg 3 times daily as needed, quantity 90/month; MME equals 22.5    Medications ordered for procedure: Meds ordered this encounter  Medications   lidocaine (XYLOCAINE) 2 % (with pres) injection 400 mg   diazepam (VALIUM) tablet 10 mg    Make sure Flumazenil is available in the pyxis when using this medication. If oversedation occurs, administer 0.2 mg IV over 15 sec. If after 45 sec no response, administer 0.2 mg again over 1 min; may repeat at 1 min intervals; not to exceed 4 doses (1 mg)    dexamethasone (DECADRON) injection 10 mg   ropivacaine (PF) 2 mg/mL (0.2%) (NAROPIN) injection 9 mL   Medications administered: We administered lidocaine, diazepam, dexamethasone, and ropivacaine (PF) 2 mg/mL (0.2%).  See the medical record for exact dosing, route, and time of administration.  Follow-up plan:   Return in about 6 weeks (around 05/30/2021) for Post Procedure Evaluation, virtual.       Status post diagnostic bilateral L3, L4, L5, S1 facet medial branch nerve blocks, status post right L3, L4, L5, S1 RFA on 05/05/2019, 04/15/20; left L3, L4, L5 RFA 05/03/2020.  Left L3,4,5 RFA 03/30/21 , Right 04/18/21      Recent Visits Date Type Provider Dept  03/30/21 Procedure visit Gillis Santa, MD [facility]  03/17/21 Office Visit Gillis Santa, MD [facility]  Showing recent visits within past 90 days and meeting all other requirements Today's Visits Date Type Provider Dept  04/18/21 Procedure visit Gillis Santa, MD [facility]  Showing today's visits and meeting all other requirements Future Appointments Date Type Provider Dept  05/30/21 Appointment Gillis Santa, MD [facility]  06/30/21 Appointment Gillis Santa, MD [facility]  Showing future appointments within next 90 days and meeting all other requirements Disposition: Discharge home  Discharge (Date  Time): 04/18/2021; 1015 hrs.   Primary Care Physician: Lavera Guise, MD Location: [facility] Note by: Gillis Santa, MD Date: 04/18/2021; Time: 10:17 AM  Disclaimer:  Medicine is not an exact science. The only guarantee in medicine is that nothing is guaranteed. It is important to note that the decision to proceed with this intervention was based on the information collected from the patient. The Data and conclusions were drawn from the patient's questionnaire, the interview, and the physical examination. Because the information was provided in  large part by the patient, it cannot  be guaranteed that it has not been purposely or unconsciously manipulated. Every effort has been made to obtain as much relevant data as possible for this evaluation. It is important to note that the conclusions that lead to this procedure are derived in large part from the available data. Always take into account that the treatment will also be dependent on availability of resources and existing treatment guidelines, considered by other Pain Management Practitioners as being common knowledge and practice, at the time of the intervention. For Medico-Legal purposes, it is also important to point out that variation in procedural techniques and pharmacological choices are the acceptable norm. The indications, contraindications, technique, and results of the above procedure should only be interpreted and judged by a Board-Certified Interventional Pain Specialist with extensive familiarity and expertise in the same exact procedure and technique.

## 2021-04-19 ENCOUNTER — Telehealth: Payer: Self-pay

## 2021-04-19 ENCOUNTER — Telehealth: Payer: Self-pay | Admitting: *Deleted

## 2021-04-19 NOTE — Telephone Encounter (Signed)
Attempted to call for post procedure follow-up. Message left. 

## 2021-04-19 NOTE — Telephone Encounter (Signed)
Post procedure phone call.  LM 

## 2021-04-22 ENCOUNTER — Encounter: Payer: Self-pay | Admitting: Nurse Practitioner

## 2021-04-22 ENCOUNTER — Other Ambulatory Visit: Payer: Self-pay

## 2021-04-22 ENCOUNTER — Ambulatory Visit: Payer: No Typology Code available for payment source | Admitting: Nurse Practitioner

## 2021-04-22 VITALS — BP 140/71 | HR 82 | Temp 98.4°F | Resp 16 | Ht 69.0 in | Wt 351.2 lb

## 2021-04-22 DIAGNOSIS — R600 Localized edema: Secondary | ICD-10-CM

## 2021-04-22 DIAGNOSIS — E1165 Type 2 diabetes mellitus with hyperglycemia: Secondary | ICD-10-CM | POA: Diagnosis not present

## 2021-04-22 DIAGNOSIS — Z23 Encounter for immunization: Secondary | ICD-10-CM

## 2021-04-22 DIAGNOSIS — J441 Chronic obstructive pulmonary disease with (acute) exacerbation: Secondary | ICD-10-CM | POA: Diagnosis not present

## 2021-04-22 DIAGNOSIS — F331 Major depressive disorder, recurrent, moderate: Secondary | ICD-10-CM

## 2021-04-22 DIAGNOSIS — J45901 Unspecified asthma with (acute) exacerbation: Secondary | ICD-10-CM

## 2021-04-22 DIAGNOSIS — I10 Essential (primary) hypertension: Secondary | ICD-10-CM

## 2021-04-22 DIAGNOSIS — E782 Mixed hyperlipidemia: Secondary | ICD-10-CM

## 2021-04-22 DIAGNOSIS — L659 Nonscarring hair loss, unspecified: Secondary | ICD-10-CM

## 2021-04-22 MED ORDER — DESVENLAFAXINE SUCCINATE ER 100 MG PO TB24: 100.0000 mg | ORAL_TABLET | Freq: Every day | ORAL | 1 refills | Status: DC

## 2021-04-22 NOTE — Progress Notes (Signed)
[facility] [place], [place] [address]  Internal MEDICINE  Office Visit Note  Patient Name: Candace Clayton  [id number]  [id number]  Date of Service: 04/22/2021  Chief Complaint  Patient presents with   Follow-up    Refills, discuss meds, feet swelling    HPI Candace Clayton presents for a follow up visit for medication refills, and feet are swelling. Her trulicity dose was increased to 3 mg weekly at her previous office visit. She is tolerating this well and has not gained any more weight. She Is tolerating current maintenance inhaler and reports that her breathing is good. She wants to get the flu shot. Her blood pressure is improved and stable.  She needs a refill of desvenlafaxine.  She is having problems with her feet swelling. She is on a diuretic , HCTZ 25 mg daily. She is on 2 medications that can potentially cause lower extremity edema or fluid retention, amlodipine and minoxidil.     Current Medication: Outpatient Encounter Medications as of 04/22/2021  Medication Sig   albuterol (VENTOLIN HFA) 108 (90 Base) MCG/ACT inhaler TAKE 2 PUFFS BY MOUTH EVERY 6 HOURS AS NEEDED FOR WHEEZE OR SHORTNESS OF BREATH   amLODipine (NORVASC) 10 MG tablet Take 1 tablet (10 mg total) by mouth daily.   Budeson-Glycopyrrol-Formoterol (BREZTRI AEROSPHERE) 160-9-4.8 MCG/ACT AERO Inhale 2 puffs into the lungs 2 (two) times daily.   busPIRone (BUSPAR) 15 MG tablet Take 1 tablet (15 mg total) by mouth 3 (three) times daily. Take on tablet TID for anxiety.   Dulaglutide (TRULICITY) 3 PN/3.6RW SOPN Inject 3 mg as directed once a week.   fluticasone (FLONASE) 50 MCG/ACT nasal spray Place 2 sprays into both nostrils daily.   glucose blood (CONTOUR NEXT TEST) test strip 1 each by Other route daily. DX E11.65   hydrochlorothiazide (HYDRODIURIL) 25 MG tablet Take 1 tablet (25 mg total) by mouth daily.   ipratropium-albuterol (DUONEB) 0.5-2.5 (3) MG/3ML SOLN Take 3 mLs by nebulization every  4 (four) hours as needed (shortness of breath or wheezing).   metoprolol succinate (TOPROL-XL) 100 MG 24 hr tablet Take 1 tablet (100 mg total) by mouth daily. TAKE WITH OR IMMEDIATELY FOLLOWING A MEAL.   Microlet Lancets MISC 1 each by Does not apply route daily. DX-E11.65   minoxidil (LONITEN) 10 MG tablet Take 1 tablet (10 mg total) by mouth daily.   Multiple Vitamins-Calcium (ONE-A-DAY WOMENS PO) Take 1 tablet by mouth daily.   NON FORMULARY cpap device   OVER THE COUNTER MEDICATION 1 tablet daily as needed. Takes generic stool softener for constipation d/t oxycodone.    oxyCODONE (OXY IR/ROXICODONE) 5 MG immediate release tablet Take by mouth.   oxyCODONE-acetaminophen (PERCOCET/ROXICET) 5-325 MG tablet Take 1 tablet by mouth every 8 (eight) hours as needed for severe pain.   oxyCODONE-acetaminophen (PERCOCET/ROXICET) 5-325 MG tablet Take 1 tablet by mouth every 8 (eight) hours as needed for severe pain.   [START ON 06/04/2021] oxyCODONE-acetaminophen (PERCOCET/ROXICET) 5-325 MG tablet Take 1 tablet by mouth every 8 (eight) hours as needed for severe pain.   pregabalin (LYRICA) 100 MG capsule Take 1 capsule (100 mg total) by mouth 2 (two) times daily. 100 mg qAM, 100-200 mg qhs   rosuvastatin (CRESTOR) 5 MG tablet Take 1 tablet (5 mg total) by mouth daily.   [DISCONTINUED] desvenlafaxine (PRISTIQ) 100 MG 24 hr tablet Take 1 tablet (100 mg total) by mouth daily.   [DISCONTINUED] ergocalciferol (DRISDOL) 1.25 MG (50000 UT) capsule Take 1 capsule (  50,000 Units total) by mouth once a week.   desvenlafaxine (PRISTIQ) 100 MG 24 hr tablet Take 1 tablet (100 mg total) by mouth daily.   [DISCONTINUED] metoprolol tartrate (LOPRESSOR) 25 MG tablet Take 2 tablets (50 mg total) by mouth 2 (two) times daily.   No facility-administered encounter medications on file as of 04/22/2021.    Surgical History: Past Surgical History:  Procedure Laterality Date   CESAREAN SECTION  1992   CHOLECYSTECTOMY   10/26/2020   COLONOSCOPY     polyps removed first procedure. 2nd time all was clear   COLONOSCOPY WITH PROPOFOL N/A 03/14/2019   Procedure: COLONOSCOPY WITH PROPOFOL;  Surgeon: Lin Landsman, MD;  Location: [facility] ENDOSCOPY;  Service: Gastroenterology;  Laterality: N/A;   FINGER SURGERY Left 2011   left finger cut off x 2.(only up to last digit)   HEMATOMA EVACUATION Left 1999   upper part of foot was injured d/t 500lb weight landing on her foot.    LAPAROSCOPIC SALPINGO OOPHERECTOMY Left 04/02/2018   Procedure: LAPAROSCOPIC SALPINGO OOPHORECTOMY;  Surgeon: Malachy Mood, MD;  Location: [facility] ORS;  Service: Gynecology;  Laterality: Left;    Medical History: Past Medical History:  Diagnosis Date   Anginal pain (HCC)    tightness related to anxiety   Anxiety    Cholecystolithiasis    Depression    Diabetes mellitus without complication (HCC)    GERD (gastroesophageal reflux disease)    throws up easily but not diagnosed with reflux   History of C-section 1992   Hypertension    Sleep apnea    uses cpap    Family History: Family History  Problem Relation Age of Onset   Breast cancer Mother 33       x 3 times   Colon cancer Mother    Brain cancer Mother    Cancer - Colon Mother    Cancer - Other Mother    Heart Problems Father    Colon cancer Brother    Heart Problems Brother     Social History   Socioeconomic History   Marital status: Divorced    Spouse name: Not on file   Number of children: 1   Years of education: Not on file   Highest education level: Not on file  Occupational History   Occupation: works in Secretary/administrator  Tobacco Use   Smoking status: Never   Smokeless tobacco: Never  Vaping Use   Vaping Use: Never used  Substance and Sexual Activity   Alcohol use: No   Drug use: No   Sexual activity: Not Currently    Birth control/protection: Post-menopausal  Other Topics Concern   Not on file  Social History Narrative   Son has schizophrenia  and autism but is fully capable of helping mother after surgery   Social Determinants of Health   Financial Resource Strain: Not on file  Food Insecurity: Not on file  Transportation Needs: Not on file  Physical Activity: Not on file  Stress: Not on file  Social Connections: Not on file  Intimate Partner Violence: Not on file      Review of Systems  Constitutional:  Negative for chills, fatigue and unexpected weight change.  HENT:  Negative for congestion, rhinorrhea, sneezing and sore throat.   Eyes:  Negative for redness.  Respiratory:  Negative for cough, chest tightness and shortness of breath.   Cardiovascular:  Negative for chest pain and palpitations.  Gastrointestinal:  Negative for abdominal pain, constipation, diarrhea, nausea and vomiting.  Genitourinary:  Negative for dysuria and frequency.  Musculoskeletal:  Negative for arthralgias, back pain, joint swelling and neck pain.       Lower extremity edema  Skin:  Negative for rash.  Neurological: Negative.  Negative for tremors and numbness.  Hematological:  Negative for adenopathy. Does not bruise/bleed easily.  Psychiatric/Behavioral:  Negative for behavioral problems (Depression), sleep disturbance and suicidal ideas. The patient is not nervous/anxious.    Vital Signs: BP 140/71   Pulse 82   Temp 98.4 F (36.9 C)   Resp 16   Ht 5\' 9"  (1.753 m)   Wt (!) 351 lb 3.2 oz (159.3 kg)   SpO2 98%   BMI 51.86 kg/m    Physical Exam Vitals reviewed.  Constitutional:      General: She is not in acute distress.    Appearance: Normal appearance. She is obese. She is not ill-appearing.  HENT:     Head: Normocephalic and atraumatic.  Eyes:     Pupils: Pupils are equal, round, and reactive to light.  Cardiovascular:     Rate and Rhythm: Normal rate and regular rhythm.  Pulmonary:     Effort: Pulmonary effort is normal. No respiratory distress.  Musculoskeletal:     Right lower leg: 1+ Edema (nonpitting) present.      Left lower leg: 1+ Edema (nonpitting) present.  Neurological:     Mental Status: She is alert and oriented to person, place, and time.     Cranial Nerves: No cranial nerve deficit.     Coordination: Coordination normal.     Gait: Gait normal.  Psychiatric:        Mood and Affect: Mood normal.        Behavior: Behavior normal.       Assessment/Plan: 1. Essential hypertension Stable with current medications. Continue as prescribed.   2. Bilateral lower extremity edema Encouraged patient to elevate feet, review what she is eating each week and ensure there is no additional or excessive salt intake. Patient confirmed that she is taking the HCTZ as prescribed. Will continue to watch and will consider adding a second diuretic if the swelling worsens. Patient encouraged to call clinic for follow up appt if the edema worsens. She is on 2 BP medications that may contribute to lower extremity edema, will consider adjusting or replacing if necessary.   3. Chronic obstructive asthma with exacerbation (HCC) Stable, doing well with breztri, no refills needed at this time.   4. Type 2 diabetes mellitus with hyperglycemia, without long-term current use of insulin ([place]) Tolerating increased trulicity dose. Next A1c to be checked no earlier than 05/27/21  5. Major depressive disorder, recurrent episode, moderate (HCC) Stable, refills ordered - desvenlafaxine (PRISTIQ) 100 MG 24 hr tablet; Take 1 tablet (100 mg total) by mouth daily.  Dispense: 90 tablet; Refill: 1  6. Needs flu shot Administered in office today - Flu Vaccine MDCK QUAD PF   General Counseling: Tiffny verbalizes understanding of the findings of todays visit and agrees with plan of treatment. I have discussed any further diagnostic evaluation that may be needed or ordered today. We also reviewed her medications today. she has been encouraged to call the office with any questions or concerns that should arise related to todays  visit.    Orders Placed This Encounter  Procedures   Flu Vaccine MDCK QUAD PF    Meds ordered this encounter  Medications   desvenlafaxine (PRISTIQ) 100 MG 24 hr tablet    Sig: Take  1 tablet (100 mg total) by mouth daily.    Dispense:  90 tablet    Refill:  1    Return in about 3 months (around 07/23/2021) for F/U, Recheck A1C, Alden Feagan PCP.   Total time spent:30 Minutes Time spent includes review of chart, medications, test results, and follow up plan with the patient.   [place] Controlled Substance Database was reviewed by me.  This patient was seen by Jonetta Osgood, FNP-C in collaboration with Dr. Clayborn Bigness as a part of collaborative care agreement.   Zebulen Simonis R. Valetta Fuller, MSN, FNP-C Internal medicine

## 2021-04-24 ENCOUNTER — Other Ambulatory Visit: Payer: Self-pay | Admitting: Nurse Practitioner

## 2021-04-24 DIAGNOSIS — E559 Vitamin D deficiency, unspecified: Secondary | ICD-10-CM

## 2021-05-17 ENCOUNTER — Encounter: Payer: BC Managed Care – PPO | Admitting: Nurse Practitioner

## 2021-05-18 ENCOUNTER — Encounter: Payer: Self-pay | Admitting: Nurse Practitioner

## 2021-05-23 ENCOUNTER — Ambulatory Visit: Payer: No Typology Code available for payment source | Admitting: Nurse Practitioner

## 2021-05-23 ENCOUNTER — Other Ambulatory Visit: Payer: Self-pay

## 2021-05-23 ENCOUNTER — Encounter: Payer: Self-pay | Admitting: Nurse Practitioner

## 2021-05-23 VITALS — BP 147/75 | HR 80 | Temp 98.7°F | Resp 16 | Ht 69.0 in | Wt 348.0 lb

## 2021-05-23 DIAGNOSIS — M1711 Unilateral primary osteoarthritis, right knee: Secondary | ICD-10-CM | POA: Diagnosis not present

## 2021-05-23 DIAGNOSIS — M542 Cervicalgia: Secondary | ICD-10-CM

## 2021-05-23 DIAGNOSIS — R600 Localized edema: Secondary | ICD-10-CM

## 2021-05-23 DIAGNOSIS — R252 Cramp and spasm: Secondary | ICD-10-CM | POA: Diagnosis not present

## 2021-05-23 MED ORDER — FUROSEMIDE 20 MG PO TABS: 20.0000 mg | ORAL_TABLET | ORAL | 0 refills | Status: DC

## 2021-05-23 NOTE — Progress Notes (Signed)
[facility] [place], [place] [address]  Internal MEDICINE  Office Visit Note  Patient Name: Candace Clayton  [id number]  [id number]  Date of Service: 05/23/2021  Chief Complaint  Patient presents with   Follow-up    Right knee pain up to right arm and now neck pain, feet, legs and thighs swelling, difficult to lift right arm   Depression   Diabetes   Gastroesophageal Reflux   Hypertension   Anxiety    HPI Gizelle presents for a follow-up visit for ED visit for right knee pain, right arm pain, and neck pain.  She feels like her feet legs and thighs are swelling and is having difficulty lifting her right arm.  She has lost 3 pounds since her last office visit.  Patient sees Dr. Holley Raring for pain management.  Patient has significant risk factors for heart disease including hypertension and diabetes.     Current Medication: Outpatient Encounter Medications as of 05/23/2021  Medication Sig   albuterol (VENTOLIN HFA) 108 (90 Base) MCG/ACT inhaler TAKE 2 PUFFS BY MOUTH EVERY 6 HOURS AS NEEDED FOR WHEEZE OR SHORTNESS OF BREATH   amLODipine (NORVASC) 10 MG tablet Take 1 tablet (10 mg total) by mouth daily.   Budeson-Glycopyrrol-Formoterol (BREZTRI AEROSPHERE) 160-9-4.8 MCG/ACT AERO Inhale 2 puffs into the lungs 2 (two) times daily.   busPIRone (BUSPAR) 15 MG tablet Take 1 tablet (15 mg total) by mouth 3 (three) times daily. Take on tablet TID for anxiety.   desvenlafaxine (PRISTIQ) 100 MG 24 hr tablet Take 1 tablet (100 mg total) by mouth daily.   Dulaglutide (TRULICITY) 3 TD/9.7CB SOPN Inject 3 mg as directed once a week.   fluticasone (FLONASE) 50 MCG/ACT nasal spray Place 2 sprays into both nostrils daily.   glucose blood (CONTOUR NEXT TEST) test strip 1 each by Other route daily. DX E11.65   hydrochlorothiazide (HYDRODIURIL) 25 MG tablet Take 1 tablet (25 mg total) by mouth daily.   ipratropium-albuterol (DUONEB) 0.5-2.5 (3) MG/3ML SOLN Take 3 mLs by nebulization  every 4 (four) hours as needed (shortness of breath or wheezing).   metoprolol succinate (TOPROL-XL) 100 MG 24 hr tablet Take 1 tablet (100 mg total) by mouth daily. TAKE WITH OR IMMEDIATELY FOLLOWING A MEAL.   Microlet Lancets MISC 1 each by Does not apply route daily. DX-E11.65   minoxidil (LONITEN) 10 MG tablet Take 1 tablet (10 mg total) by mouth daily.   Multiple Vitamins-Calcium (ONE-A-DAY WOMENS PO) Take 1 tablet by mouth daily.   NON FORMULARY cpap device   OVER THE COUNTER MEDICATION 1 tablet daily as needed. Takes generic stool softener for constipation d/t oxycodone.    oxyCODONE (OXY IR/ROXICODONE) 5 MG immediate release tablet Take by mouth.   pregabalin (LYRICA) 100 MG capsule Take 1 capsule (100 mg total) by mouth 2 (two) times daily. 100 mg qAM, 100-200 mg qhs   rosuvastatin (CRESTOR) 5 MG tablet Take 1 tablet (5 mg total) by mouth daily.   Vitamin D, Ergocalciferol, (DRISDOL) 1.25 MG (50000 UNIT) CAPS capsule TAKE 1 CAPSULE BY MOUTH ONE TIME PER WEEK   [DISCONTINUED] furosemide (LASIX) 20 MG tablet Take 1 tablet (20 mg total) by mouth every other day for 14 days. And then as needed for edema.   [DISCONTINUED] oxyCODONE-acetaminophen (PERCOCET/ROXICET) 5-325 MG tablet Take 1 tablet by mouth every 8 (eight) hours as needed for severe pain.   [DISCONTINUED] oxyCODONE-acetaminophen (PERCOCET/ROXICET) 5-325 MG tablet Take 1 tablet by mouth every 8 (eight) hours as  needed for severe pain.   [DISCONTINUED] metoprolol tartrate (LOPRESSOR) 25 MG tablet Take 2 tablets (50 mg total) by mouth 2 (two) times daily.   [DISCONTINUED] oxyCODONE-acetaminophen (PERCOCET/ROXICET) 5-325 MG tablet Take 1 tablet by mouth every 8 (eight) hours as needed for severe pain.   No facility-administered encounter medications on file as of 05/23/2021.    Surgical History: Past Surgical History:  Procedure Laterality Date   CESAREAN SECTION  1992   CHOLECYSTECTOMY  10/26/2020   COLONOSCOPY     polyps  removed first procedure. 2nd time all was clear   COLONOSCOPY WITH PROPOFOL N/A 03/14/2019   Procedure: COLONOSCOPY WITH PROPOFOL;  Surgeon: Lin Landsman, MD;  Location: [facility] ENDOSCOPY;  Service: Gastroenterology;  Laterality: N/A;   FINGER SURGERY Left 2011   left finger cut off x 2.(only up to last digit)   HEMATOMA EVACUATION Left 1999   upper part of foot was injured d/t 500lb weight landing on her foot.    LAPAROSCOPIC SALPINGO OOPHERECTOMY Left 04/02/2018   Procedure: LAPAROSCOPIC SALPINGO OOPHORECTOMY;  Surgeon: Malachy Mood, MD;  Location: [facility] ORS;  Service: Gynecology;  Laterality: Left;    Medical History: Past Medical History:  Diagnosis Date   Anginal pain (HCC)    tightness related to anxiety   Anxiety    Cholecystolithiasis    Depression    Diabetes mellitus without complication (HCC)    GERD (gastroesophageal reflux disease)    throws up easily but not diagnosed with reflux   History of C-section 1992   Hypertension    Sleep apnea    uses cpap    Family History: Family History  Problem Relation Age of Onset   Breast cancer Mother 21       x 3 times   Colon cancer Mother    Brain cancer Mother    Cancer - Colon Mother    Cancer - Other Mother    Heart Problems Father    Colon cancer Brother    Heart Problems Brother     Social History   Socioeconomic History   Marital status: Divorced    Spouse name: Not on file   Number of children: 1   Years of education: Not on file   Highest education level: Not on file  Occupational History   Occupation: works in Secretary/administrator  Tobacco Use   Smoking status: Never   Smokeless tobacco: Never  Vaping Use   Vaping Use: Never used  Substance and Sexual Activity   Alcohol use: No   Drug use: No   Sexual activity: Not Currently    Birth control/protection: Post-menopausal  Other Topics Concern   Not on file  Social History Narrative   Son has schizophrenia and autism but is fully capable of  helping mother after surgery   Social Determinants of Health   Financial Resource Strain: Not on file  Food Insecurity: Not on file  Transportation Needs: Not on file  Physical Activity: Not on file  Stress: Not on file  Social Connections: Not on file  Intimate Partner Violence: Not on file      Review of Systems  Constitutional:  Negative for chills, fatigue and unexpected weight change.  HENT:  Negative for congestion, rhinorrhea, sneezing and sore throat.   Eyes:  Negative for redness.  Respiratory:  Positive for shortness of breath. Negative for cough, chest tightness and wheezing.   Cardiovascular:  Positive for leg swelling. Negative for chest pain and palpitations.  Gastrointestinal:  Negative for  abdominal pain, constipation, diarrhea, nausea and vomiting.  Genitourinary:  Negative for dysuria and frequency.  Musculoskeletal:  Positive for arthralgias, back pain, joint swelling and neck pain.  Skin:  Negative for rash.  Neurological: Negative.  Negative for tremors and numbness.  Hematological:  Negative for adenopathy. Does not bruise/bleed easily.  Psychiatric/Behavioral:  Negative for behavioral problems (Depression), sleep disturbance and suicidal ideas. The patient is not nervous/anxious.    Vital Signs: BP (!) 147/75   Pulse 80   Temp 98.7 F (37.1 C)   Resp 16   Ht 5' 9"  (1.753 m)   Wt (!) 348 lb (157.9 kg)   SpO2 98%   BMI 51.39 kg/m    Physical Exam Vitals reviewed.  Constitutional:      General: She is not in acute distress.    Appearance: Normal appearance. She is obese. She is not ill-appearing.  HENT:     Head: Normocephalic and atraumatic.  Eyes:     Pupils: Pupils are equal, round, and reactive to light.  Neck:     Vascular: No carotid bruit.  Cardiovascular:     Rate and Rhythm: Normal rate and regular rhythm.     Heart sounds: Normal heart sounds. No murmur heard. Pulmonary:     Effort: Pulmonary effort is normal. No respiratory  distress.     Breath sounds: Normal breath sounds. No wheezing.  Musculoskeletal:        General: Swelling present.     Right lower leg: Edema present.     Left lower leg: Edema present.  Lymphadenopathy:     Cervical: No cervical adenopathy.  Neurological:     Mental Status: She is alert and oriented to person, place, and time.     Cranial Nerves: No cranial nerve deficit.     Coordination: Coordination normal.     Gait: Gait normal.  Psychiatric:        Mood and Affect: Mood normal.        Behavior: Behavior normal.       Assessment/Plan: 1. Bilateral lower extremity edema Lasix prescribed to help decrease edema.  2. Muscle cramps Labs ordered to rule out autoimmune problem vs. Electrolyte imbalance.  - Basic Metabolic Panel (BMET) - Rheumatoid Factor - ANA Direct w/Reflex if Positive - Sed Rate (ESR) - C-reactive protein  3. Arthritis of right knee See problem#2, patient also instructed to call Dr. Holley Raring.  - Rheumatoid Factor - ANA Direct w/Reflex if Positive - Sed Rate (ESR) - C-reactive protein  4. Acute neck pain See problem #2 - Rheumatoid Factor - ANA Direct w/Reflex if Positive - Sed Rate (ESR) - C-reactive protein   General Counseling: Ruther verbalizes understanding of the findings of todays visit and agrees with plan of treatment. I have discussed any further diagnostic evaluation that may be needed or ordered today. We also reviewed her medications today. she has been encouraged to call the office with any questions or concerns that should arise related to todays visit.    Orders Placed This Encounter  Procedures   Basic Metabolic Panel (BMET)   Rheumatoid Factor   ANA Direct w/Reflex if Positive   Sed Rate (ESR)   C-reactive protein    Meds ordered this encounter  Medications   DISCONTD: furosemide (LASIX) 20 MG tablet    Sig: Take 1 tablet (20 mg total) by mouth every other day for 14 days. And then as needed for edema.    Dispense:  20  tablet  Refill:  0    Return in about 2 weeks (around 06/06/2021) for F/U, Review labs/test, eval new med, [place] PCP.   Total time spent:30 Minutes Time spent includes review of chart, medications, test results, and follow up plan with the patient.   [place] Controlled Substance Database was reviewed by me.  This patient was seen by Jonetta Osgood, FNP-C in collaboration with Dr. Clayborn Bigness as a part of collaborative care agreement.   Marlin Brys R. Valetta Fuller, MSN, FNP-C Internal medicine

## 2021-05-24 ENCOUNTER — Telehealth: Payer: Self-pay | Admitting: Student in an Organized Health Care Education/Training Program

## 2021-05-24 NOTE — Telephone Encounter (Signed)
Already has appt on 05-30-21. She may discuss at that time.

## 2021-05-24 NOTE — Telephone Encounter (Signed)
Patient stated she cannot wait until Monday. She has already been to the ED. She wants to talk to a nurse

## 2021-05-24 NOTE — Telephone Encounter (Signed)
Patient has been having swelling in feet and knee with severe pain, now has moved up to her shoulder and now her neck. She wants to come in and see Dr. Holley Raring. Says she has spoken to her pcp and is being treated for the swelling in her feet. Has to go have blood work done but was afraid she would miss our call. Please call patient

## 2021-05-24 NOTE — Telephone Encounter (Signed)
Called and talked with patients son. He states that she had gone to get blood work . Instructed him for her to call back tomorrow and she if Dr Holley Raring has an earlier appt.

## 2021-05-25 ENCOUNTER — Ambulatory Visit
Payer: No Typology Code available for payment source | Attending: Student in an Organized Health Care Education/Training Program | Admitting: Student in an Organized Health Care Education/Training Program

## 2021-05-25 ENCOUNTER — Encounter: Payer: Self-pay | Admitting: Student in an Organized Health Care Education/Training Program

## 2021-05-25 ENCOUNTER — Other Ambulatory Visit: Payer: Self-pay

## 2021-05-25 VITALS — BP 145/76 | HR 94 | Temp 97.0°F | Resp 18 | Ht 69.0 in | Wt 330.0 lb

## 2021-05-25 DIAGNOSIS — M47816 Spondylosis without myelopathy or radiculopathy, lumbar region: Secondary | ICD-10-CM | POA: Diagnosis present

## 2021-05-25 DIAGNOSIS — G894 Chronic pain syndrome: Secondary | ICD-10-CM | POA: Insufficient documentation

## 2021-05-25 LAB — C-REACTIVE PROTEIN: CRP: 2 mg/L (ref 0–10)

## 2021-05-25 LAB — BASIC METABOLIC PANEL
BUN/Creatinine Ratio: 13 (ref 12–28)
BUN: 11 mg/dL (ref 8–27)
CO2: 25 mmol/L (ref 20–29)
Calcium: 9.5 mg/dL (ref 8.7–10.3)
Chloride: 100 mmol/L (ref 96–106)
Creatinine, Ser: 0.82 mg/dL (ref 0.57–1.00)
Glucose: 138 mg/dL — ABNORMAL HIGH (ref 70–99)
Potassium: 4.2 mmol/L (ref 3.5–5.2)
Sodium: 140 mmol/L (ref 134–144)
eGFR: 82 mL/min/{1.73_m2} (ref >59)

## 2021-05-25 LAB — RHEUMATOID FACTOR: Rheumatoid fact SerPl-aCnc: <10 IU/mL (ref <14.0)

## 2021-05-25 LAB — SEDIMENTATION RATE: Sed Rate: 12 mm/hr (ref 0–40)

## 2021-05-25 LAB — ANA W/REFLEX IF POSITIVE: Anti Nuclear Antibody (ANA): NEGATIVE

## 2021-05-25 MED ORDER — OXYCODONE-ACETAMINOPHEN 5-325 MG PO TABS: 1.0000 | ORAL_TABLET | Freq: Three times a day (TID) | ORAL | 0 refills | Status: DC | PRN

## 2021-05-25 MED ORDER — KETOROLAC TROMETHAMINE 30 MG/ML IJ SOLN
30.0000 mg | Freq: Once | INTRAMUSCULAR | Status: AC
  Administered 2021-05-25: 30 mg via INTRAMUSCULAR
  Filled 2021-05-25: qty 1

## 2021-05-25 MED ORDER — ORPHENADRINE CITRATE 30 MG/ML IJ SOLN
30.0000 mg | Freq: Once | INTRAMUSCULAR | Status: AC
  Administered 2021-05-25: 60 mg via INTRAMUSCULAR
  Filled 2021-05-25: qty 2

## 2021-05-25 NOTE — Progress Notes (Signed)
Nursing Pain Medication Assessment:  Safety precautions to be maintained throughout the outpatient stay will include: orient to surroundings, keep bed in low position, maintain call bell within reach at all times, provide assistance with transfer out of bed and ambulation.  Medication Inspection Compliance: Pill count conducted under aseptic conditions, in front of the patient. Neither the pills nor the bottle was removed from the patient's sight at any time. Once count was completed pills were immediately returned to the patient in their original bottle.  Medication: Oxycodone/APAP Pill/Patch Count:  02 of 90 pills remain Pill/Patch Appearance: Markings consistent with prescribed medication Bottle Appearance: Standard pharmacy container. Clearly labeled. Filled Date: 23 / 11 / 2022 Last Medication intake:  Today

## 2021-05-25 NOTE — Telephone Encounter (Signed)
Spoke with Dr. Holley Raring he wants nurse to call and triage patient this am asap, and get with him for consideration of norflex/toradol.

## 2021-05-25 NOTE — Telephone Encounter (Signed)
Spoke  with patient   she will come today for Toradol Norflex injection for knee pain and shoulder pain.

## 2021-05-26 ENCOUNTER — Telehealth: Payer: Self-pay | Admitting: Student in an Organized Health Care Education/Training Program

## 2021-05-26 ENCOUNTER — Telehealth: Payer: Self-pay

## 2021-05-26 NOTE — Progress Notes (Signed)
PROVIDER NOTE: Information contained herein reflects review and annotations entered in association with encounter. Interpretation of such information and data should be left to medically-trained personnel. Information provided to patient can be located elsewhere in the medical record under "Patient Instructions". Document created using STT-dictation technology, any transcriptional errors that may result from process are unintentional.    Patient: Candace Clayton  Service Category: E/M  Provider: Gillis Santa, MD  DOB: 25-Sep-1960  DOS: 05/25/2021  Specialty: Interventional Pain Management  MRN: [id number]  Setting: Ambulatory outpatient  PCP: Lavera Guise, MD  Type: Established Patient    Referring Provider: Lavera Guise, MD  Location: Office  Delivery: Face-to-face     HPI  [name], a 60 y.o. year old female, is here today because of her Lumbar facet arthropathy [M47.816]. [name] primary complain today is Shoulder Pain (right), Knee Pain (right), and Back Pain (mid) Last encounter: My last encounter with her was on 04/18/21 Pertinent problems: [name] has Major depressive disorder, recurrent episode, moderate ([place]); Morbid obesity ([place]); Low back pain with sciatica; OSA on CPAP; Lumbar degenerative disc disease; Lumbar facet arthropathy (R>L); Lumbar spondylosis; Chronic pain syndrome; and Type 2 diabetes mellitus with hyperglycemia, without long-term current use of insulin (HCC) on their pertinent problem list. Pain Assessment: Severity of Chronic pain is reported as a 10-Worst pain ever/10. Location: Shoulder (and right knee and mid back)  /shoots down right arm. Onset: More than a month ago. Quality: Constant (stinging pain, cannot bear for clothes to touch skin). Timing: Constant. Modifying factor(s): pain meds. Vitals:  height is 5' 9"  (1.753 m) and weight is 330 lb (149.7 kg) (abnormal). Her temporal temperature is 97 F (36.1 C) (abnormal). Her blood pressure is 145/76  (abnormal) and her pulse is 94. Her respiration is 18 and oxygen saturation is 98%.   Reason for encounter: medication management.    Yamina presents today for an acute back pain flare and right knee pain.  Of note she is status post lumbar radiofrequency ablation of her medial branch nerves on 03/30/2021 for the left side and on 04/18/2021 for the right side.  She was doing well after that until she had an acute pain in her right knee that she went to the emergency department for.  She states that she was told that the emergency department to take more of the oxycodone that she was prescribed by myself.  Upon review of the note, instructions were to take oxycodone as prescribed and follow-up with the pain clinic.  I informed Candace Clayton that overtaking her medications without contacting us is a violation of her pain contract.  This is happened in the past as well.  I am aware that she was having increased pain of her right knee and was trying to help her overall pain but I instructed her that she should have followed up with [place] after her visit to the ED.  This is her last warning and she is aware of proper intake of her medications.    Pharmacotherapy Assessment  Analgesic: Percocet 5 mg 3 times daily as needed, quantity 90/month; MME equals 22.5    Monitoring:  PMP: PDMP not reviewed this encounter.       Pharmacotherapy: No side-effects or adverse reactions reported. Compliance: No problems identified. Effectiveness: Clinically acceptable.  Candace Patience, RN  05/25/2021  1:30 PM  Sign when Signing Visit Nursing Pain Medication Assessment:  Safety precautions to be maintained throughout the outpatient stay will include: orient  to surroundings, keep bed in low position, maintain call bell within reach at all times, provide assistance with transfer out of bed and ambulation.  Medication Inspection Compliance: Pill count conducted under aseptic conditions, in front of the patient. Neither the pills nor  the bottle was removed from the patient's sight at any time. Once count was completed pills were immediately returned to the patient in their original bottle.  Medication: Oxycodone/APAP Pill/Patch Count:  02 of 90 pills remain Pill/Patch Appearance: Markings consistent with prescribed medication Bottle Appearance: Standard pharmacy container. Clearly labeled. Filled Date: 38 / 11 / 2022 Last Medication intake:  Today    UDS:  Summary  Date Value Ref Range Status  12/28/2020 Note  Final    Comment:    ==================================================================== [employer] [facility] (MW) ==================================================================== Test                             Result       Flag       Units  Drug Present and Declared for Prescription Verification   Oxycodone                      1252         EXPECTED   ng/mg creat   Oxymorphone                    748          EXPECTED   ng/mg creat   Noroxycodone                   1206         EXPECTED   ng/mg creat   Noroxymorphone                 232          EXPECTED   ng/mg creat    Sources of oxycodone are scheduled prescription medications.    Oxymorphone, noroxycodone, and noroxymorphone are expected    metabolites of oxycodone. Oxymorphone is also available as a    scheduled prescription medication.  ==================================================================== Test                      Result    Flag   Units      Ref Range   Creatinine              126              mg/dL      >=20 ==================================================================== Declared Medications:  The flagging and interpretation on this report are based on the  following declared medications.  Unexpected results may arise from  inaccuracies in the declared medications.   **Note: The testing scope of this panel includes these medications:   Oxycodone (Roxicodone)  Oxycodone (Percocet)   **Note: The testing scope of this  panel does not include the  following reported medications:   Acetaminophen (Percocet)  Albuterol (Ventolin HFA)  Amlodipine (Norvasc)  Budesonide (Breztri Aerosphere)  Buspirone (Buspar)  Cetirizine (Zyrtec)  Desvenlafaxine (Pristiq)  Docusate (Stool Softener)  Dulaglutide  Enalapril (Vasotec)  Fluticasone (Flonase)  Formoterol (Breztri Aerosphere)  Glycopyrrolate (Breztri Aerosphere)  Hydrochlorothiazide (Hydrodiuril)  Hydroxyzine (Atarax)  Melatonin  Metformin (Glucophage)  Metoprolol (Toprol)  Multivitamin  Ondansetron (Zofran)  Oxybutynin (Ditropan)  Pregabalin (Lyrica)  Rosuvastatin (Crestor)  Vitamin D2 ==================================================================== For clinical consultation, please call [phone]. ====================================================================  ROS  Constitutional: Denies any fever or chills Gastrointestinal: No reported hemesis, hematochezia, vomiting, or acute GI distress Musculoskeletal:  Left low back pain, right knee pain  Neurological: No reported episodes of acute onset apraxia, aphasia, dysarthria, agnosia, amnesia, paralysis, loss of coordination, or loss of consciousness  Medication Review  Budeson-Glycopyrrol-Formoterol, Dulaglutide, Microlet Lancets, Multiple Vitamins-Minerals, NON FORMULARY, OVER THE COUNTER MEDICATION, Vitamin D (Ergocalciferol), albuterol, amLODipine, busPIRone, desvenlafaxine, fluticasone, furosemide, glucose blood, hydrochlorothiazide, ipratropium-albuterol, metoprolol succinate, metoprolol tartrate, minoxidil, oxyCODONE, oxyCODONE-acetaminophen, pregabalin, and rosuvastatin  History Review  Allergy: [name] has No Known Allergies. Drug: [name]  reports no history of drug use. Alcohol:  reports no history of alcohol use. Tobacco:  reports that she has never smoked. She has never used smokeless tobacco. Social: [name]  reports that she has never smoked. She has  never used smokeless tobacco. She reports that she does not drink alcohol and does not use drugs. Medical:  has a past medical history of Anginal pain ([place]), Anxiety, Cholecystolithiasis, Depression, Diabetes mellitus without complication ([place]), GERD (gastroesophageal reflux disease), History of C-section (1992), Hypertension, and Sleep apnea. Surgical: [name]  has a past surgical history that includes Cesarean section (1992); Finger surgery (Left, 2011); Hematoma evacuation (Left, 1999); Colonoscopy; Laparoscopic salpingo oophorectomy (Left, 04/02/2018); Colonoscopy with propofol (N/A, 03/14/2019); and Cholecystectomy (10/26/2020). Family: family history includes Brain cancer in her mother; Breast cancer (age of onset: 70) in her mother; Cancer - Colon in her mother; Cancer - Other in her mother; Colon cancer in her brother and mother; Heart Problems in her brother and father.  Laboratory Chemistry Profile   Renal Lab Results  Component Value Date   BUN 11 05/24/2021   CREATININE 0.82 05/24/2021   BCR 13 05/24/2021   GFRAA 106 02/23/2020   GFRNONAA 92 02/23/2020    Hepatic Lab Results  Component Value Date   AST 18 11/18/2020   ALT 24 11/18/2020   ALBUMIN 4.1 11/18/2020   ALKPHOS 120 11/18/2020    Electrolytes Lab Results  Component Value Date   NA 140 05/24/2021   K 4.2 05/24/2021   CL 100 05/24/2021   CALCIUM 9.5 05/24/2021    Bone Lab Results  Component Value Date   VD25OH 12.6 (L) 02/23/2020    Inflammation (CRP: Acute Phase) (ESR: Chronic Phase) Lab Results  Component Value Date   CRP 2 05/24/2021   ESRSEDRATE 12 05/24/2021          Note: Above Lab results reviewed.  Physical Exam  General appearance: Well nourished, well developed, and well hydrated. In no apparent acute distress Mental status: Alert, oriented x 3 (person, place, & time)       Respiratory: No evidence of acute respiratory distress Eyes: PERLA Vitals: BP (!) 145/76   Pulse 94   Temp (!) 97  F (36.1 C) (Temporal)   Resp 18   Ht 5' 9"  (1.753 m)   Wt (!) 330 lb (149.7 kg)   SpO2 98%   BMI 48.73 kg/m  BMI: Estimated body mass index is 48.73 kg/m as calculated from the following:   Height as of this encounter: 5' 9"  (1.753 m).   Weight as of this encounter: 330 lb (149.7 kg). Ideal: Ideal body weight: 66.2 kg (145 lb 15.1 oz) Adjusted ideal body weight: 99.6 kg (219 lb 9.1 oz)  Lumbar Spine Area Exam  Skin & Axial Inspection: No masses, redness, or swelling Alignment: Symmetrical Functional ROM: Limited  Stability: No instability detected Muscle Tone/Strength: Functionally intact. No  obvious neuro-muscular anomalies detected. Sensory (Neurological): facetogenic    Gait & Posture Assessment  Ambulation: Unassisted Gait: Relatively normal for age and body habitus Posture: WNL    Lower Extremity Exam      Side: Right lower extremity   Side: Left lower extremity  Stability: No instability observed           Stability: No instability observed          Skin & Extremity Inspection: Skin color, temperature, and hair growth are WNL. No peripheral edema or cyanosis. No masses, redness, swelling, asymmetry, or associated skin lesions. No contractures.   Skin & Extremity Inspection: Skin color, temperature, and hair growth are WNL. No peripheral edema or cyanosis. No masses, redness, swelling, asymmetry, or associated skin lesions. No contractures.  Functional ROM: Pain restricted range of motion                Functional ROM: Unrestricted ROM                  Muscle Tone/Strength: Functionally intact. No obvious neuro-muscular anomalies detected.   Muscle Tone/Strength: Functionally intact. No obvious neuro-muscular anomalies detected.  Sensory (Neurological): Arthropathic        Sensory (Neurological): Unimpaired        DTR: Patellar: deferred today Achilles: deferred today Plantar: deferred today   DTR: Patellar: deferred today Achilles: deferred today Plantar: deferred  today  Palpation: No palpable anomalies   Palpation: No palpable anomalies    Assessment   Status Diagnosis  Responding Responding Controlled 1. Lumbar facet arthropathy    2. Lumbar spondylosis   3. Chronic pain syndrome       Plan of Care   [name] has a current medication list which includes the following long-term medication(s): albuterol, amlodipine, desvenlafaxine, fluticasone, furosemide, hydrochlorothiazide, ipratropium-albuterol, metoprolol succinate, minoxidil, pregabalin, rosuvastatin, oxycodone-acetaminophen, [START ON 06/24/2021] oxycodone-acetaminophen, and [DISCONTINUED] metoprolol tartrate.  Pharmacotherapy (Medications Ordered): Meds ordered this encounter  Medications   orphenadrine (NORFLEX) injection 30 mg   ketorolac (TORADOL) 30 MG/ML injection 30 mg   oxyCODONE-acetaminophen (PERCOCET/ROXICET) 5-325 MG tablet    Sig: Take 1 tablet by mouth every 8 (eight) hours as needed for severe pain.    Dispense:  90 tablet    Refill:  0    For chronic pain syndrome. To last for 30 days from fill date.   oxyCODONE-acetaminophen (PERCOCET/ROXICET) 5-325 MG tablet    Sig: Take 1 tablet by mouth every 8 (eight) hours as needed for severe pain.    Dispense:  90 tablet    Refill:  0    For chronic pain syndrome. To last for 30 days from fill date.   Orders:  No orders of the defined types were placed in this encounter.   Follow-up plan:   Return in about 8 weeks (around 07/21/2021) for Medication Management, in person.     Status post diagnostic bilateral L3, L4, L5, S1 facet medial branch nerve blocks, status post right L3, L4, L5, S1 RFA on 05/05/2019, 04/15/20; left L3, L4, L5 RFA 05/03/2020.        Recent Visits Date Type Provider Dept  05/25/21 Office Visit Gillis Santa, MD [facility]  04/18/21 Procedure visit Gillis Santa, MD [facility]  03/30/21 Procedure visit Gillis Santa, MD [facility]  03/17/21 Office Visit  Gillis Santa, MD [facility]  Showing recent visits within past 90 days and meeting all other requirements Future Appointments Date Type Provider Dept  07/07/21 Appointment Gillis Santa, MD [facility]  Showing future appointments within next 90 days and meeting all other requirements I discussed the assessment and treatment plan with the patient. The patient was provided an opportunity to ask questions and all were answered. The patient agreed with the plan and demonstrated an understanding of the instructions.  Patient advised to call back or seek an in-person evaluation if the symptoms or condition worsens.  Duration of encounter: 30 minutes.  Note by: Gillis Santa, MD Date: 05/25/2021; Time: 9:28 AM

## 2021-05-26 NOTE — Telephone Encounter (Signed)
Pt is unable to pick up Rx saying the the order was not changed. She said that Dr.L changed it at her last appt

## 2021-05-26 NOTE — Telephone Encounter (Signed)
I have spoken with pharmacy and patient and she can fill her Rx on 05/30/21.  She is aware of this and she is aware that pills must last 30 days from fill date.

## 2021-05-26 NOTE — Telephone Encounter (Signed)
Called [facility] and gave them a verbal per BL that they can go ahead and fill the Rx that was written to fill on yesterday.  Explained that patient had overtaken her medicine d/t increased pain.  The pharmacy is trying to see if they can override the 30 days but are not able to fill until 05/30/21.    Called patient to let her know that she can fill on 05/30/21 and she will need to call pharmacy to have them dispense.  Very clear that this medication must last 30 days and patient verbalized u/o information.

## 2021-05-26 NOTE — Telephone Encounter (Signed)
Patient called stating pharmacy will not fill her script. Looks like Dr. Holley Raring sent in one to fill 05-25-21. Please call pharmacy and then let patient know status. She says if any questions ask dr Holley Raring

## 2021-05-30 ENCOUNTER — Telehealth: Payer: Self-pay | Admitting: Student in an Organized Health Care Education/Training Program

## 2021-05-30 ENCOUNTER — Telehealth
Payer: No Typology Code available for payment source | Admitting: Student in an Organized Health Care Education/Training Program

## 2021-05-30 DIAGNOSIS — G894 Chronic pain syndrome: Secondary | ICD-10-CM

## 2021-05-30 DIAGNOSIS — M47816 Spondylosis without myelopathy or radiculopathy, lumbar region: Secondary | ICD-10-CM

## 2021-05-30 NOTE — Telephone Encounter (Signed)
Can you put in an order for Bilateral Lumbar facets for Candace Clayton so that [employer] can get it approved? Thanks

## 2021-05-30 NOTE — Telephone Encounter (Signed)
Im going to need an order to get prior auth

## 2021-05-30 NOTE — Telephone Encounter (Signed)
Called patient . She needs to be scheduled for lumbar facets, bilateral

## 2021-05-30 NOTE — Telephone Encounter (Signed)
Called and talk with patient she states she is having so much pain in her back (Chronic ), neck and shoulder pain acute.  Patient unable to fill medication until 12/9.  Talked with Dr Holley Raring and he states that she could come in and have bilateral lumbar facet.

## 2021-05-30 NOTE — Telephone Encounter (Signed)
Called patient, No answer. Left message to call back if needed.

## 2021-06-01 ENCOUNTER — Other Ambulatory Visit: Payer: Self-pay

## 2021-06-01 ENCOUNTER — Ambulatory Visit: Payer: No Typology Code available for payment source

## 2021-06-01 DIAGNOSIS — G4733 Obstructive sleep apnea (adult) (pediatric): Secondary | ICD-10-CM | POA: Diagnosis not present

## 2021-06-01 NOTE — Progress Notes (Signed)
She was fit to a new resmed F-20 lg  Received new cpap supplies   Pt was seen by Claiborne Billings  RRT/RCP  from [facility]

## 2021-06-06 ENCOUNTER — Ambulatory Visit: Payer: No Typology Code available for payment source | Admitting: Nurse Practitioner

## 2021-06-06 ENCOUNTER — Other Ambulatory Visit: Payer: Self-pay

## 2021-06-06 ENCOUNTER — Ambulatory Visit
Admission: RE | Admit: 2021-06-06 | Discharge: 2021-06-06 | Disposition: A | Discharge status: Home / Self Care | Payer: No Typology Code available for payment source | Source: Ambulatory Visit | Attending: Nurse Practitioner | Admitting: Nurse Practitioner

## 2021-06-06 ENCOUNTER — Encounter: Payer: Self-pay | Admitting: Nurse Practitioner

## 2021-06-06 ENCOUNTER — Ambulatory Visit
Admission: RE | Admit: 2021-06-06 | Discharge: 2021-06-06 | Disposition: A | Discharge status: Home / Self Care | Payer: No Typology Code available for payment source | Attending: Nurse Practitioner | Admitting: Nurse Practitioner

## 2021-06-06 VITALS — BP 140/76 | HR 89 | Temp 98.7°F | Resp 16 | Ht 69.0 in | Wt 347.8 lb

## 2021-06-06 DIAGNOSIS — M542 Cervicalgia: Secondary | ICD-10-CM | POA: Diagnosis present

## 2021-06-06 DIAGNOSIS — R0789 Other chest pain: Secondary | ICD-10-CM | POA: Insufficient documentation

## 2021-06-06 DIAGNOSIS — E1165 Type 2 diabetes mellitus with hyperglycemia: Secondary | ICD-10-CM | POA: Diagnosis not present

## 2021-06-06 DIAGNOSIS — R600 Localized edema: Secondary | ICD-10-CM

## 2021-06-06 LAB — POCT GLYCOSYLATED HEMOGLOBIN (HGB A1C): Hemoglobin A1C: 6.9 % — AB (ref 4.0–5.6)

## 2021-06-06 MED ORDER — METFORMIN HCL 500 MG PO TABS: 500.0000 mg | ORAL_TABLET | Freq: Two times a day (BID) | ORAL | 1 refills | Status: DC

## 2021-06-06 MED ORDER — CHLORTHALIDONE 25 MG PO TABS: 25.0000 mg | ORAL_TABLET | ORAL | 1 refills | Status: DC

## 2021-06-06 NOTE — Progress Notes (Signed)
[facility] [place], [place] [address]  Internal MEDICINE  Office Visit Note  Patient Name: Candace Clayton  [id number]  [id number]  Date of Service: 06/06/2021  Chief Complaint  Patient presents with   Follow-up    Discuss meds, neck pain, swelling in feet, right shoulder pain into right breast area and arm    Anxiety   Depression   Diabetes   Gastroesophageal Reflux   Hypertension   Results    HPI Savina presents for a follow-up visit for acute neck pain, arthritis of the right knee and muscle cramps.  Labs were done to rule out an autoimmune problem and her sed rate and CRP were normal, ANA was negative and rheumatoid factor was normal as well.   She is reporting continued neck pain, persistent swelling in both feet despite taking Lasix every other day.  She also reports having right shoulder pain that radiates into the right breast and right axillary area. Her A1c is 6.9 today which is elevated from 6.4 in September.    Current Medication: Outpatient Encounter Medications as of 06/06/2021  Medication Sig   albuterol (VENTOLIN HFA) 108 (90 Base) MCG/ACT inhaler TAKE 2 PUFFS BY MOUTH EVERY 6 HOURS AS NEEDED FOR WHEEZE OR SHORTNESS OF BREATH   amLODipine (NORVASC) 10 MG tablet Take 1 tablet (10 mg total) by mouth daily.   Budeson-Glycopyrrol-Formoterol (BREZTRI AEROSPHERE) 160-9-4.8 MCG/ACT AERO Inhale 2 puffs into the lungs 2 (two) times daily.   busPIRone (BUSPAR) 15 MG tablet Take 1 tablet (15 mg total) by mouth 3 (three) times daily. Take on tablet TID for anxiety.   desvenlafaxine (PRISTIQ) 100 MG 24 hr tablet Take 1 tablet (100 mg total) by mouth daily.   Dulaglutide (TRULICITY) 3 XT/0.2IO SOPN Inject 3 mg as directed once a week.   fluticasone (FLONASE) 50 MCG/ACT nasal spray Place 2 sprays into both nostrils daily.   glucose blood (CONTOUR NEXT TEST) test strip 1 each by Other route daily. DX E11.65   hydrochlorothiazide (HYDRODIURIL) 25 MG  tablet Take 1 tablet (25 mg total) by mouth daily.   ipratropium-albuterol (DUONEB) 0.5-2.5 (3) MG/3ML SOLN Take 3 mLs by nebulization every 4 (four) hours as needed (shortness of breath or wheezing).   metFORMIN (GLUCOPHAGE) 500 MG tablet Take 1 tablet (500 mg total) by mouth 2 (two) times daily with a meal.   metoprolol succinate (TOPROL-XL) 100 MG 24 hr tablet Take 1 tablet (100 mg total) by mouth daily. TAKE WITH OR IMMEDIATELY FOLLOWING A MEAL.   Microlet Lancets MISC 1 each by Does not apply route daily. DX-E11.65   minoxidil (LONITEN) 10 MG tablet Take 1 tablet (10 mg total) by mouth daily.   Multiple Vitamins-Calcium (ONE-A-DAY WOMENS PO) Take 1 tablet by mouth daily.   NON FORMULARY cpap device   OVER THE COUNTER MEDICATION 1 tablet daily as needed. Takes generic stool softener for constipation d/t oxycodone.    oxyCODONE (OXY IR/ROXICODONE) 5 MG immediate release tablet Take by mouth.   oxyCODONE-acetaminophen (PERCOCET/ROXICET) 5-325 MG tablet Take 1 tablet by mouth every 8 (eight) hours as needed for severe pain.   oxyCODONE-acetaminophen (PERCOCET/ROXICET) 5-325 MG tablet Take 1 tablet by mouth every 8 (eight) hours as needed for severe pain.   pregabalin (LYRICA) 100 MG capsule Take 1 capsule (100 mg total) by mouth 2 (two) times daily. 100 mg qAM, 100-200 mg qhs   rosuvastatin (CRESTOR) 5 MG tablet Take 1 tablet (5 mg total) by mouth daily.  Vitamin D, Ergocalciferol, (DRISDOL) 1.25 MG (50000 UNIT) CAPS capsule TAKE 1 CAPSULE BY MOUTH ONE TIME PER WEEK   [DISCONTINUED] chlorthalidone (HYGROTON) 25 MG tablet Take 1 tablet (25 mg total) by mouth every other day.   [DISCONTINUED] furosemide (LASIX) 20 MG tablet Take 1 tablet (20 mg total) by mouth every other day for 14 days. And then as needed for edema.   [DISCONTINUED] metoprolol tartrate (LOPRESSOR) 25 MG tablet Take 2 tablets (50 mg total) by mouth 2 (two) times daily.   No facility-administered encounter medications on file as  of 06/06/2021.    Surgical History: Past Surgical History:  Procedure Laterality Date   CESAREAN SECTION  1992   CHOLECYSTECTOMY  10/26/2020   COLONOSCOPY     polyps removed first procedure. 2nd time all was clear   COLONOSCOPY WITH PROPOFOL N/A 03/14/2019   Procedure: COLONOSCOPY WITH PROPOFOL;  Surgeon: Lin Landsman, MD;  Location: [facility] ENDOSCOPY;  Service: Gastroenterology;  Laterality: N/A;   FINGER SURGERY Left 2011   left finger cut off x 2.(only up to last digit)   HEMATOMA EVACUATION Left 1999   upper part of foot was injured d/t 500lb weight landing on her foot.    LAPAROSCOPIC SALPINGO OOPHERECTOMY Left 04/02/2018   Procedure: LAPAROSCOPIC SALPINGO OOPHORECTOMY;  Surgeon: Malachy Mood, MD;  Location: [facility] ORS;  Service: Gynecology;  Laterality: Left;    Medical History: Past Medical History:  Diagnosis Date   Anginal pain (HCC)    tightness related to anxiety   Anxiety    Cholecystolithiasis    Depression    Diabetes mellitus without complication (HCC)    GERD (gastroesophageal reflux disease)    throws up easily but not diagnosed with reflux   History of C-section 1992   Hypertension    Sleep apnea    uses cpap    Family History: Family History  Problem Relation Age of Onset   Breast cancer Mother 72       x 3 times   Colon cancer Mother    Brain cancer Mother    Cancer - Colon Mother    Cancer - Other Mother    Heart Problems Father    Colon cancer Brother    Heart Problems Brother     Social History   Socioeconomic History   Marital status: Divorced    Spouse name: Not on file   Number of children: 1   Years of education: Not on file   Highest education level: Not on file  Occupational History   Occupation: works in Secretary/administrator  Tobacco Use   Smoking status: Never   Smokeless tobacco: Never  Vaping Use   Vaping Use: Never used  Substance and Sexual Activity   Alcohol use: No   Drug use: No   Sexual activity: Not  Currently    Birth control/protection: Post-menopausal  Other Topics Concern   Not on file  Social History Narrative   Son has schizophrenia and autism but is fully capable of helping mother after surgery   Social Determinants of Health   Financial Resource Strain: Not on file  Food Insecurity: Not on file  Transportation Needs: Not on file  Physical Activity: Not on file  Stress: Not on file  Social Connections: Not on file  Intimate Partner Violence: Not on file      Review of Systems  Constitutional:  Negative for chills, fatigue and unexpected weight change.  HENT:  Negative for congestion, rhinorrhea, sneezing and sore throat.  Eyes:  Negative for redness.  Respiratory:  Positive for shortness of breath. Negative for cough, chest tightness and wheezing.   Cardiovascular:  Positive for leg swelling. Negative for chest pain and palpitations.  Gastrointestinal:  Negative for abdominal pain, constipation, diarrhea, nausea and vomiting.  Genitourinary:  Negative for dysuria and frequency.  Musculoskeletal:  Positive for arthralgias, back pain, joint swelling and neck pain.  Skin:  Negative for rash.  Neurological: Negative.  Negative for tremors and numbness.  Hematological:  Negative for adenopathy. Does not bruise/bleed easily.  Psychiatric/Behavioral:  Negative for behavioral problems (Depression), sleep disturbance and suicidal ideas. The patient is not nervous/anxious.    Vital Signs: BP 140/76   Pulse 89   Temp 98.7 F (37.1 C)   Resp 16   Ht 5\' 9"  (1.753 m)   Wt (!) 347 lb 12.8 oz (157.8 kg)   SpO2 97%   BMI 51.36 kg/m    Physical Exam Vitals reviewed.  Constitutional:      General: She is not in acute distress.    Appearance: Normal appearance. She is obese. She is not ill-appearing.  HENT:     Head: Normocephalic and atraumatic.  Eyes:     Pupils: Pupils are equal, round, and reactive to light.  Neck:     Vascular: No carotid bruit.  Cardiovascular:      Rate and Rhythm: Normal rate and regular rhythm.     Heart sounds: Normal heart sounds. No murmur heard. Pulmonary:     Effort: Pulmonary effort is normal. No respiratory distress.     Breath sounds: Normal breath sounds. No wheezing.  Musculoskeletal:        General: Swelling present.     Right lower leg: Edema present.     Left lower leg: Edema present.  Lymphadenopathy:     Cervical: No cervical adenopathy.  Neurological:     Mental Status: She is alert and oriented to person, place, and time.     Cranial Nerves: No cranial nerve deficit.     Coordination: Coordination normal.     Gait: Gait normal.  Psychiatric:        Mood and Affect: Mood normal.        Behavior: Behavior normal.       Assessment/Plan: 1. Acute neck pain Xray of cervical spine ordered to rule out fracture or acute abnormalities - DG Cervical Spine Complete; Future  2. Right-sided chest wall pain Chest xray to rule out skeletal abnormalities.  - DG Chest 2 View; Future  3. Type 2 diabetes mellitus with hyperglycemia, without long-term current use of insulin (HCC) A1C is stable continue metformin.  - POCT HgB A1C - metFORMIN (GLUCOPHAGE) 500 MG tablet; Take 1 tablet (500 mg total) by mouth 2 (two) times daily with a meal.  Dispense: 180 tablet; Refill: 1  4. Bilateral lower extremity edema Chlorthalidone prescribed, discontinue lasix.    General Counseling: Hallee verbalizes understanding of the findings of todays visit and agrees with plan of treatment. I have discussed any further diagnostic evaluation that may be needed or ordered today. We also reviewed her medications today. she has been encouraged to call the office with any questions or concerns that should arise related to todays visit.    Orders Placed This Encounter  Procedures   DG Cervical Spine Complete   DG Chest 2 View   POCT HgB A1C    Meds ordered this encounter  Medications   DISCONTD: chlorthalidone (HYGROTON) 25 MG  tablet  Sig: Take 1 tablet (25 mg total) by mouth every other day.    Dispense:  30 tablet    Refill:  1    Discontinue lasix   metFORMIN (GLUCOPHAGE) 500 MG tablet    Sig: Take 1 tablet (500 mg total) by mouth 2 (two) times daily with a meal.    Dispense:  180 tablet    Refill:  1    Return in about 2 weeks (around 06/20/2021) for F/U discuss imaging , Jeffie Widdowson PCP, also need follow up in 3 months for A1C.   Total time spent:30 Minutes Time spent includes review of chart, medications, test results, and follow up plan with the patient.   [place] Controlled Substance Database was reviewed by me.  This patient was seen by Jonetta Osgood, FNP-C in collaboration with Dr. Clayborn Bigness as a part of collaborative care agreement.   Keeshawn Fakhouri R. Valetta Fuller, MSN, FNP-C Internal medicine

## 2021-06-08 ENCOUNTER — Telehealth: Payer: Self-pay

## 2021-06-08 NOTE — Telephone Encounter (Signed)
Called to schedule the procedure for Monday the 19th and the patient asked if I would send a note to her doctor letting him know she has a lot of pain in her right arm and cannot use it.

## 2021-06-08 NOTE — Telephone Encounter (Signed)
Patient wants to discuss shoulder pain. She has an appointment on 12/19 and she can discuss then.

## 2021-06-09 ENCOUNTER — Telehealth: Payer: Self-pay

## 2021-06-09 NOTE — Telephone Encounter (Signed)
Disability paperwork received and given to Alyssa.

## 2021-06-13 ENCOUNTER — Ambulatory Visit
Admission: RE | Admit: 2021-06-13 | Discharge: 2021-06-13 | Disposition: A | Discharge status: Home / Self Care | Payer: No Typology Code available for payment source | Source: Ambulatory Visit | Attending: Student in an Organized Health Care Education/Training Program | Admitting: Student in an Organized Health Care Education/Training Program

## 2021-06-13 ENCOUNTER — Ambulatory Visit (HOSPITAL_BASED_OUTPATIENT_CLINIC_OR_DEPARTMENT_OTHER)
Payer: No Typology Code available for payment source | Admitting: Student in an Organized Health Care Education/Training Program

## 2021-06-13 ENCOUNTER — Encounter: Payer: Self-pay | Admitting: Student in an Organized Health Care Education/Training Program

## 2021-06-13 ENCOUNTER — Other Ambulatory Visit: Payer: Self-pay

## 2021-06-13 DIAGNOSIS — M47816 Spondylosis without myelopathy or radiculopathy, lumbar region: Secondary | ICD-10-CM | POA: Diagnosis not present

## 2021-06-13 DIAGNOSIS — G894 Chronic pain syndrome: Secondary | ICD-10-CM | POA: Insufficient documentation

## 2021-06-13 MED ORDER — ROPIVACAINE HCL 2 MG/ML IJ SOLN
INTRAMUSCULAR | Status: AC
  Filled 2021-06-13: qty 20

## 2021-06-13 MED ORDER — ROPIVACAINE HCL 2 MG/ML IJ SOLN
9.0000 mL | Freq: Once | INTRAMUSCULAR | Status: AC
  Administered 2021-06-13: 11:00:00 9 mL via PERINEURAL

## 2021-06-13 MED ORDER — DEXAMETHASONE SODIUM PHOSPHATE 10 MG/ML IJ SOLN
10.0000 mg | Freq: Once | INTRAMUSCULAR | Status: AC
  Administered 2021-06-13: 11:00:00 10 mg

## 2021-06-13 MED ORDER — KETOROLAC TROMETHAMINE 30 MG/ML IJ SOLN
30.0000 mg | Freq: Once | INTRAMUSCULAR | Status: AC
  Administered 2021-06-13: 12:00:00 30 mg via INTRAMUSCULAR

## 2021-06-13 MED ORDER — KETOROLAC TROMETHAMINE 30 MG/ML IJ SOLN
INTRAMUSCULAR | Status: AC
  Filled 2021-06-13: qty 1

## 2021-06-13 MED ORDER — DEXAMETHASONE SODIUM PHOSPHATE 10 MG/ML IJ SOLN
INTRAMUSCULAR | Status: AC
  Filled 2021-06-13: qty 2

## 2021-06-13 MED ORDER — DIAZEPAM 5 MG PO TABS
ORAL_TABLET | ORAL | Status: AC
  Filled 2021-06-13: qty 2

## 2021-06-13 MED ORDER — LIDOCAINE HCL 2 % IJ SOLN
20.0000 mL | Freq: Once | INTRAMUSCULAR | Status: AC
  Administered 2021-06-13: 11:00:00 400 mg

## 2021-06-13 MED ORDER — ORPHENADRINE CITRATE 30 MG/ML IJ SOLN
INTRAMUSCULAR | Status: AC
  Filled 2021-06-13: qty 2

## 2021-06-13 MED ORDER — DIAZEPAM 5 MG PO TABS
10.0000 mg | ORAL_TABLET | ORAL | Status: AC
  Administered 2021-06-13: 11:00:00 10 mg via ORAL

## 2021-06-13 MED ORDER — ORPHENADRINE CITRATE 30 MG/ML IJ SOLN
30.0000 mg | Freq: Once | INTRAMUSCULAR | Status: AC
  Administered 2021-06-13: 12:00:00 30 mg via INTRAMUSCULAR

## 2021-06-13 MED ORDER — LIDOCAINE HCL 2 % IJ SOLN
INTRAMUSCULAR | Status: AC
  Filled 2021-06-13: qty 20

## 2021-06-13 NOTE — Progress Notes (Signed)
Safety precautions to be maintained throughout the outpatient stay will include: orient to surroundings, keep bed in low position, maintain call bell within reach at all times, provide assistance with transfer out of bed and ambulation.  

## 2021-06-13 NOTE — Progress Notes (Signed)
Patient's Name: Candace Clayton  MRN: [id number]  Referring Provider: Gillis Santa, MD  DOB: 08-23-1960  PCP: Lavera Guise, MD  DOS: 06/13/2021  Note by: Gillis Santa, MD  Service setting: Ambulatory outpatient  Specialty: Interventional Pain Management  Patient type: Established  Location: [facility] (AMB) Pain Management Facility  Visit type: Interventional Procedure   Primary Reason for Visit: Interventional Pain Management Treatment. CC: Back Pain (Low center) and Shoulder Pain (right)  Procedure:          Anesthesia, Analgesia, Anxiolysis:  Type: Lumbar Facet, Medial Branch Block(s) Primary Purpose: Palliative (until next RFA) Region: Posterolateral Lumbosacral Spine Level:  L3, L4, L5,  Medial Branch Level(s). Injecting these levels blocks the L3-4, L4-5, and L5-S1 lumbar facet joints. Laterality: Bilateral   (s/p R L3,4,5,S1 06/2018, 05/05/2019, R L3,4,5 RFA 04/18/21 LEFT L3,4,5 RFA done 12/2018, 05/03/20, 03/30/21)  Type: Local Anesthesia with 10 MG PO Valium (anxiolysis)  Local Anesthetic: Lidocaine 1-2%  Position: Prone   Indications: 1. Lumbar facet arthropathy    2. Lumbar spondylosis   3. Chronic pain syndrome    Pain Score: Pre-procedure: 5 /10 Post-procedure: 0-No pain/10  Pre-op Assessment:  Candace Clayton is a 60 y.o. (year old), female patient, seen today for interventional treatment. She  has a past surgical history that includes Cesarean section (1992); Finger surgery (Left, 2011); Hematoma evacuation (Left, 1999); Colonoscopy; Laparoscopic salpingo oophorectomy (Left, 04/02/2018); Colonoscopy with propofol (N/A, 03/14/2019); and Cholecystectomy (10/26/2020). Candace Clayton has a current medication list which includes the following prescription(s): albuterol, amlodipine, breztri aerosphere, buspirone, chlorthalidone, desvenlafaxine, trulicity, fluticasone, contour next test, hydrochlorothiazide, ipratropium-albuterol, metformin, metoprolol succinate, microlet lancets, minoxidil,  multiple vitamins-minerals, NON FORMULARY, OVER THE COUNTER MEDICATION, oxycodone, oxycodone-acetaminophen, [START ON 06/24/2021] oxycodone-acetaminophen, pregabalin, rosuvastatin, vitamin d (ergocalciferol), and [DISCONTINUED] metoprolol tartrate. Her primarily concern today is the Back Pain (Low center) and Shoulder Pain (right)  Initial Vital Signs:  Pulse/HCG Rate: 85  Temp: 98.6 F (37 C) Resp: 18 BP: (!) 164/85 SpO2: 100 %  BMI: Estimated body mass index is 48.73 kg/m as calculated from the following:   Height as of this encounter: 5\' 9"  (1.753 m).   Weight as of this encounter: 330 lb (149.7 kg).  Risk Assessment: Allergies: Reviewed. She has No Known Allergies.  Allergy Precautions: None required Coagulopathies: Reviewed. None identified.  Blood-thinner therapy: None at this time Active Infection(s): Reviewed. None identified. Candace Clayton is afebrile  Site Confirmation: Candace Clayton was asked to confirm the procedure and laterality before marking the site Procedure checklist: Completed Consent: Before the procedure and under the influence of no sedative(s), amnesic(s), or anxiolytics, the patient was informed of the treatment options, risks and possible complications. To fulfill our ethical and legal obligations, as recommended by the American Medical Association's Code of Ethics, I have informed the patient of my clinical impression; the nature and purpose of the treatment or procedure; the risks, benefits, and possible complications of the intervention; the alternatives, including doing nothing; the risk(s) and benefit(s) of the alternative treatment(s) or procedure(s); and the risk(s) and benefit(s) of doing nothing. The patient was provided information about the general risks and possible complications associated with the procedure. These may include, but are not limited to: failure to achieve desired goals, infection, bleeding, organ or nerve damage, allergic reactions,  paralysis, and death. In addition, the patient was informed of those risks and complications associated to Spine-related procedures, such as failure to decrease pain; infection (i.e.: Meningitis, epidural or intraspinal abscess); bleeding (i.e.: epidural hematoma, subarachnoid  hemorrhage, or any other type of intraspinal or peri-dural bleeding); organ or nerve damage (i.e.: Any type of peripheral nerve, nerve root, or spinal cord injury) with subsequent damage to sensory, motor, and/or autonomic systems, resulting in permanent pain, numbness, and/or weakness of one or several areas of the body; allergic reactions; (i.e.: anaphylactic reaction); and/or death. Furthermore, the patient was informed of those risks and complications associated with the medications. These include, but are not limited to: allergic reactions (i.e.: anaphylactic or anaphylactoid reaction(s)); adrenal axis suppression; blood sugar elevation that in diabetics may result in ketoacidosis or comma; water retention that in patients with history of congestive heart failure may result in shortness of breath, pulmonary edema, and decompensation with resultant heart failure; weight gain; swelling or edema; medication-induced neural toxicity; particulate matter embolism and blood vessel occlusion with resultant organ, and/or nervous system infarction; and/or aseptic necrosis of one or more joints. Finally, the patient was informed that Medicine is not an exact science; therefore, there is also the possibility of unforeseen or unpredictable risks and/or possible complications that may result in a catastrophic outcome. The patient indicated having understood very clearly. We have given the patient no guarantees and we have made no promises. Enough time was given to the patient to ask questions, all of which were answered to the patient's satisfaction. Candace Clayton has indicated that she wanted to continue with the procedure. Attestation: I, the  ordering provider, attest that I have discussed with the patient the benefits, risks, side-effects, alternatives, likelihood of achieving goals, and potential problems during recovery for the procedure that I have provided informed consent. Date   Time: 06/13/2021 10:47 AM  Pre-Procedure Preparation:  Monitoring: As per clinic protocol. Respiration, ETCO2, SpO2, BP, heart rate and rhythm monitor placed and checked for adequate function Safety Precautions: Patient was assessed for positional comfort and pressure points before starting the procedure. Time-out: I initiated and conducted the "Time-out" before starting the procedure, as per protocol. The patient was asked to participate by confirming the accuracy of the "Time Out" information. Verification of the correct person, site, and procedure were performed and confirmed by me, the nursing staff, and the patient. "Time-out" conducted as per Joint Commission's Universal Protocol (UP.01.01.01). Time: 1124  Description of Procedure:          Laterality: Bilateral. The procedure was performed in identical fashion on both sides. Levels:  L3, L4, L5,Medial Branch Level(s) Area Prepped: Posterior Lumbosacral Region Prepping solution: ChloraPrep (2% chlorhexidine gluconate and 70% isopropyl alcohol) Safety Precautions: Aspiration looking for blood return was conducted prior to all injections. At no point did we inject any substances, as a needle was being advanced. Before injecting, the patient was told to immediately notify me if she was experiencing any new onset of "ringing in the ears, or metallic taste in the mouth". No attempts were made at seeking any paresthesias. Safe injection practices and needle disposal techniques used. Medications properly checked for expiration dates. SDV (single dose vial) medications used. After the completion of the procedure, all disposable equipment used was discarded in the proper designated medical waste  containers. Local Anesthesia: Protocol guidelines were followed. The patient was positioned over the fluoroscopy table. The area was prepped in the usual manner. The time-out was completed. The target area was identified using fluoroscopy. A 12-in long, straight, sterile hemostat was used with fluoroscopic guidance to locate the targets for each level blocked. Once located, the skin was marked with an approved surgical skin marker. Once all sites were  marked, the skin (epidermis, dermis, and hypodermis), as well as deeper tissues (fat, connective tissue and muscle) were infiltrated with a small amount of a short-acting local anesthetic, loaded on a 10cc syringe with a 25G, 1.5-in  Needle. An appropriate amount of time was allowed for local anesthetics to take effect before proceeding to the next step. Local Anesthetic: Lidocaine 2.0% The unused portion of the local anesthetic was discarded in the proper designated containers. Technical explanation of process:  L3 Medial Branch Nerve Block (MBB): The target area for the L3 medial branch is at the junction of the postero-lateral aspect of the superior articular process and the superior, posterior, and medial edge of the transverse process of L4. Under fluoroscopic guidance, a Quincke needle was inserted until contact was made with os over the superior postero-lateral aspect of the pedicular shadow (target area). After negative aspiration for blood, 1 mL of the nerve block solution was injected without difficulty or complication. The needle was removed intact. L4 Medial Branch Nerve Block (MBB): The target area for the L4 medial branch is at the junction of the postero-lateral aspect of the superior articular process and the superior, posterior, and medial edge of the transverse process of L5. Under fluoroscopic guidance, a Quincke needle was inserted until contact was made with os over the superior postero-lateral aspect of the pedicular shadow (target area).  After negative aspiration for blood,1 mL of the nerve block solution was injected without difficulty or complication. The needle was removed intact. L5 Medial Branch Nerve Block (MBB): The target area for the L5 medial branch is at the junction of the postero-lateral aspect of the superior articular process and the superior, posterior, and medial edge of the sacral ala. Under fluoroscopic guidance, a Quincke needle was inserted until contact was made with os over the superior postero-lateral aspect of the pedicular shadow (target area). After negative aspiration for blood, 15mL of the nerve block solution was injected without difficulty or complication. The needle was removed intact. Procedural Needles: 22-gauge, 5-inch, Quincke needles used for all levels. Nerve block solution: 10 cc solution made of 8 cc of 0.2% ropivacaine, 2 cc of Decadron 10 mg/cc. 1-1.5 cc injected at each level above bilaterally. The unused portion of the solution was discarded in the proper designated containers.  Once the entire procedure was completed, the treated area was cleaned, making sure to leave some of the prepping solution back to take advantage of its long term bactericidal properties.   Illustration of the posterior view of the lumbar spine and the posterior neural structures. Laminae of L2 through S1 are labeled. DPRL5, dorsal primary ramus of L5; DPRS1, dorsal primary ramus of S1; DPR3, dorsal primary ramus of L3; FJ, facet (zygapophyseal) joint L3-L4; I, inferior articular process of L4; LB1, lateral branch of dorsal primary ramus of L1; IAB, inferior articular branches from L3 medial branch (supplies L4-L5 facet joint); IBP, intermediate branch plexus; MB3, medial branch of dorsal primary ramus of L3; NR3, third lumbar nerve root; S, superior articular process of L5; SAB, superior articular branches from L4 (supplies L4-5 facet joint also); TP3, transverse process of L3.  Vitals:   06/13/21 1123 06/13/21 1128  06/13/21 1133 06/13/21 1138  BP: 139/89 139/77 (!) 141/76 140/75  Pulse: 86 84 81 80  Resp: (!) 25 (!) 26 (!) 23 (!) 21  Temp:      TempSrc:      SpO2: 95% 96% 95% 96%  Weight:      Height:  Start Time: 1124 hrs. End Time: 1136 hrs.  Imaging Guidance (Spinal):          Type of Imaging Technique: Fluoroscopy Guidance (Spinal) Indication(s): Assistance in needle guidance and placement for procedures requiring needle placement in or near specific anatomical locations not easily accessible without such assistance. Exposure Time: Please see nurses notes. Contrast: None used. Fluoroscopic Guidance: I was personally present during the use of fluoroscopy. "Tunnel Vision Technique" used to obtain the best possible view of the target area. Parallax error corrected before commencing the procedure. "Direction-depth-direction" technique used to introduce the needle under continuous pulsed fluoroscopy. Once target was reached, antero-posterior, oblique, and lateral fluoroscopic projection used confirm needle placement in all planes. Images permanently stored in EMR. Interpretation: No contrast injected. I personally interpreted the imaging intraoperatively. Adequate needle placement confirmed in multiple planes. Permanent images saved into the patient's record.   Post-operative Assessment:  Post-procedure Vital Signs:  Pulse/HCG Rate: 80 (NSR)  Temp: 98.6 F (37 C) Resp: (!) 21 BP: 140/75 SpO2: 96 %  EBL: None  Complications: No immediate post-treatment complications observed by team, or reported by patient.  Note: The patient tolerated the entire procedure well. A repeat set of vitals were taken after the procedure and the patient was kept under observation following institutional policy, for this type of procedure. Post-procedural neurological assessment was performed, showing return to baseline, prior to discharge. The patient was provided with post-procedure discharge instructions,  including a section on how to identify potential problems. Should any problems arise concerning this procedure, the patient was given instructions to immediately contact us, at any time, without hesitation. In any case, we plan to contact the patient by telephone for a follow-up status report regarding this interventional procedure.  Comments:  No additional relevant information.  Plan of Care   Imaging Orders         DG PAIN [facility] C-ARM 1-60 MIN NO REPORT      Requested Prescriptions    No prescriptions requested or ordered in this encounter     Medications ordered for procedure: Meds ordered this encounter  Medications   lidocaine (XYLOCAINE) 2 % (with pres) injection 400 mg   diazepam (VALIUM) tablet 10 mg    Make sure Flumazenil is available in the pyxis when using this medication. If oversedation occurs, administer 0.2 mg IV over 15 sec. If after 45 sec no response, administer 0.2 mg again over 1 min; may repeat at 1 min intervals; not to exceed 4 doses (1 mg)   dexamethasone (DECADRON) injection 10 mg   dexamethasone (DECADRON) injection 10 mg   ropivacaine (PF) 2 mg/mL (0.2%) (NAROPIN) injection 9 mL   ropivacaine (PF) 2 mg/mL (0.2%) (NAROPIN) injection 9 mL   orphenadrine (NORFLEX) injection 30 mg   ketorolac (TORADOL) 30 MG/ML injection 30 mg   Instructed to avoid NSAIDs for the next 5 days.  Medications administered: We administered lidocaine, diazepam, dexamethasone, dexamethasone, ropivacaine (PF) 2 mg/mL (0.2%), ropivacaine (PF) 2 mg/mL (0.2%), orphenadrine, and ketorolac.  See the medical record for exact dosing, route, and time of administration.  Disposition: Discharge home  Discharge Date & Time: 06/13/2021; 1144 hrs.   Physician-requested Follow-up: Return for Keep sch. appt.  Future Appointments  Date Time Provider [place]  06/21/2021  4:00 PM Jonetta Osgood, NP [facility] None  07/07/2021 11:40 AM Gillis Santa, MD [facility] None  07/20/2021   2:40 PM Jonetta Osgood, NP [facility] None  09/05/2021  4:00 PM Jonetta Osgood, NP [facility] None  11/28/2021  10:00 AM Jonetta Osgood, NP [place] None   Primary Care Physician: Lavera Guise, MD Location: [facility] Note by: Gillis Santa, MD Date: 06/13/2021; Time: 11:54 AM  Disclaimer:  Medicine is not an exact science. The only guarantee in medicine is that nothing is guaranteed. It is important to note that the decision to proceed with this intervention was based on the information collected from the patient. The Data and conclusions were drawn from the patient's questionnaire, the interview, and the physical examination. Because the information was provided in large part by the patient, it cannot be guaranteed that it has not been purposely or unconsciously manipulated. Every effort has been made to obtain as much relevant data as possible for this evaluation. It is important to note that the conclusions that lead to this procedure are derived in large part from the available data. Always take into account that the treatment will also be dependent on availability of resources and existing treatment guidelines, considered by other Pain Management Practitioners as being common knowledge and practice, at the time of the intervention. For Medico-Legal purposes, it is also important to point out that variation in procedural techniques and pharmacological choices are the acceptable norm. The indications, contraindications, technique, and results of the above procedure should only be interpreted and judged by a Board-Certified Interventional Pain Specialist with extensive familiarity and expertise in the same exact procedure and technique.

## 2021-06-14 ENCOUNTER — Telehealth: Payer: Self-pay

## 2021-06-14 NOTE — Telephone Encounter (Signed)
Post procedure phone call.  LM 

## 2021-06-20 ENCOUNTER — Other Ambulatory Visit: Payer: Self-pay | Admitting: Nurse Practitioner

## 2021-06-21 ENCOUNTER — Other Ambulatory Visit: Payer: Self-pay

## 2021-06-21 ENCOUNTER — Ambulatory Visit: Payer: No Typology Code available for payment source | Admitting: Nurse Practitioner

## 2021-06-21 ENCOUNTER — Encounter: Payer: Self-pay | Admitting: Nurse Practitioner

## 2021-06-21 VITALS — BP 140/72 | HR 93 | Temp 98.3°F | Resp 16 | Ht 69.0 in | Wt 354.2 lb

## 2021-06-21 DIAGNOSIS — I10 Essential (primary) hypertension: Secondary | ICD-10-CM

## 2021-06-21 DIAGNOSIS — R600 Localized edema: Secondary | ICD-10-CM | POA: Diagnosis not present

## 2021-06-21 DIAGNOSIS — M5412 Radiculopathy, cervical region: Secondary | ICD-10-CM

## 2021-06-21 MED ORDER — CHLORTHALIDONE 25 MG PO TABS: 25.0000 mg | ORAL_TABLET | Freq: Every day | ORAL | 2 refills | Status: DC

## 2021-06-21 NOTE — Progress Notes (Signed)
[facility] [place], [place] [address]  Internal MEDICINE  Office Visit Note  Patient Name: Candace Clayton  [id number]  [id number]  Date of Service: 06/21/2021  Chief Complaint  Patient presents with   Follow-up   Hypertension   Depression   Diabetes   Gastroesophageal Reflux    HPI Voncille presents for a follow-up visit for severe neck pain and to review imaging results.  She had an x-ray of her cervical spine which showed severe degenerative changes.  Her chest x-ray was normal.  She also continues to have significant lower extremity edema bilaterally and Lasix every other day did not help.  We tried chlorthalidone every other day which did not show significant improvement.  Patient restarted her metformin as of her previous visit.      Current Medication: Outpatient Encounter Medications as of 06/21/2021  Medication Sig   albuterol (VENTOLIN HFA) 108 (90 Base) MCG/ACT inhaler TAKE 2 PUFFS BY MOUTH EVERY 6 HOURS AS NEEDED FOR WHEEZE OR SHORTNESS OF BREATH   amLODipine (NORVASC) 10 MG tablet Take 1 tablet (10 mg total) by mouth daily.   Budeson-Glycopyrrol-Formoterol (BREZTRI AEROSPHERE) 160-9-4.8 MCG/ACT AERO Inhale 2 puffs into the lungs 2 (two) times daily.   busPIRone (BUSPAR) 15 MG tablet Take 1 tablet (15 mg total) by mouth 3 (three) times daily. Take on tablet TID for anxiety.   desvenlafaxine (PRISTIQ) 100 MG 24 hr tablet Take 1 tablet (100 mg total) by mouth daily.   Dulaglutide (TRULICITY) 3 VX/7.9TJ SOPN Inject 3 mg as directed once a week.   fluticasone (FLONASE) 50 MCG/ACT nasal spray Place 2 sprays into both nostrils daily.   glucose blood (CONTOUR NEXT TEST) test strip 1 each by Other route daily. DX E11.65   hydrochlorothiazide (HYDRODIURIL) 25 MG tablet Take 1 tablet (25 mg total) by mouth daily.   ipratropium-albuterol (DUONEB) 0.5-2.5 (3) MG/3ML SOLN Take 3 mLs by nebulization every 4 (four) hours as needed (shortness of breath or  wheezing).   metFORMIN (GLUCOPHAGE) 500 MG tablet Take 1 tablet (500 mg total) by mouth 2 (two) times daily with a meal.   metoprolol succinate (TOPROL-XL) 100 MG 24 hr tablet Take 1 tablet (100 mg total) by mouth daily. TAKE WITH OR IMMEDIATELY FOLLOWING A MEAL.   Microlet Lancets MISC 1 each by Does not apply route daily. DX-E11.65   minoxidil (LONITEN) 10 MG tablet Take 1 tablet (10 mg total) by mouth daily.   Multiple Vitamins-Calcium (ONE-A-DAY WOMENS PO) Take 1 tablet by mouth daily.   NON FORMULARY cpap device   OVER THE COUNTER MEDICATION 1 tablet daily as needed. Takes generic stool softener for constipation d/t oxycodone.    oxyCODONE (OXY IR/ROXICODONE) 5 MG immediate release tablet Take by mouth.   oxyCODONE-acetaminophen (PERCOCET/ROXICET) 5-325 MG tablet Take 1 tablet by mouth every 8 (eight) hours as needed for severe pain.   [START ON 06/24/2021] oxyCODONE-acetaminophen (PERCOCET/ROXICET) 5-325 MG tablet Take 1 tablet by mouth every 8 (eight) hours as needed for severe pain.   pregabalin (LYRICA) 100 MG capsule Take 1 capsule (100 mg total) by mouth 2 (two) times daily. 100 mg qAM, 100-200 mg qhs   rosuvastatin (CRESTOR) 5 MG tablet Take 1 tablet (5 mg total) by mouth daily.   Vitamin D, Ergocalciferol, (DRISDOL) 1.25 MG (50000 UNIT) CAPS capsule TAKE 1 CAPSULE BY MOUTH ONE TIME PER WEEK   [DISCONTINUED] chlorthalidone (HYGROTON) 25 MG tablet Take 1 tablet (25 mg total) by mouth every other day.  chlorthalidone (HYGROTON) 25 MG tablet Take 1 tablet (25 mg total) by mouth daily.   [DISCONTINUED] metoprolol tartrate (LOPRESSOR) 25 MG tablet Take 2 tablets (50 mg total) by mouth 2 (two) times daily.   No facility-administered encounter medications on file as of 06/21/2021.    Surgical History: Past Surgical History:  Procedure Laterality Date   CESAREAN SECTION  1992   CHOLECYSTECTOMY  10/26/2020   COLONOSCOPY     polyps removed first procedure. 2nd time all was clear    COLONOSCOPY WITH PROPOFOL N/A 03/14/2019   Procedure: COLONOSCOPY WITH PROPOFOL;  Surgeon: Lin Landsman, MD;  Location: [facility] ENDOSCOPY;  Service: Gastroenterology;  Laterality: N/A;   FINGER SURGERY Left 2011   left finger cut off x 2.(only up to last digit)   HEMATOMA EVACUATION Left 1999   upper part of foot was injured d/t 500lb weight landing on her foot.    LAPAROSCOPIC SALPINGO OOPHERECTOMY Left 04/02/2018   Procedure: LAPAROSCOPIC SALPINGO OOPHORECTOMY;  Surgeon: Malachy Mood, MD;  Location: [facility] ORS;  Service: Gynecology;  Laterality: Left;    Medical History: Past Medical History:  Diagnosis Date   Anginal pain (HCC)    tightness related to anxiety   Anxiety    Cholecystolithiasis    Depression    Diabetes mellitus without complication (HCC)    GERD (gastroesophageal reflux disease)    throws up easily but not diagnosed with reflux   History of C-section 1992   Hypertension    Sleep apnea    uses cpap    Family History: Family History  Problem Relation Age of Onset   Breast cancer Mother 25       x 3 times   Colon cancer Mother    Brain cancer Mother    Cancer - Colon Mother    Cancer - Other Mother    Heart Problems Father    Colon cancer Brother    Heart Problems Brother     Social History   Socioeconomic History   Marital status: Divorced    Spouse name: Not on file   Number of children: 1   Years of education: Not on file   Highest education level: Not on file  Occupational History   Occupation: works in Secretary/administrator  Tobacco Use   Smoking status: Never   Smokeless tobacco: Never  Vaping Use   Vaping Use: Never used  Substance and Sexual Activity   Alcohol use: No   Drug use: No   Sexual activity: Not Currently    Birth control/protection: Post-menopausal  Other Topics Concern   Not on file  Social History Narrative   Son has schizophrenia and autism but is fully capable of helping mother after surgery   Social Determinants  of Health   Financial Resource Strain: Not on file  Food Insecurity: Not on file  Transportation Needs: Not on file  Physical Activity: Not on file  Stress: Not on file  Social Connections: Not on file  Intimate Partner Violence: Not on file      Review of Systems  Constitutional:  Negative for chills, fatigue and unexpected weight change.  HENT:  Negative for congestion, rhinorrhea, sneezing and sore throat.   Eyes:  Negative for redness.  Respiratory:  Negative for cough, chest tightness and shortness of breath.   Cardiovascular:  Negative for chest pain and palpitations.  Gastrointestinal:  Negative for abdominal pain, constipation, diarrhea, nausea and vomiting.  Genitourinary:  Negative for dysuria and frequency.  Musculoskeletal:  Negative  for arthralgias, back pain, joint swelling and neck pain.  Skin:  Negative for rash.  Neurological: Negative.  Negative for tremors and numbness.  Hematological:  Negative for adenopathy. Does not bruise/bleed easily.  Psychiatric/Behavioral:  Negative for behavioral problems (Depression), sleep disturbance and suicidal ideas. The patient is not nervous/anxious.    Vital Signs: BP 140/72    Pulse 93    Temp 98.3 F (36.8 C)    Resp 16    Ht 5\' 9"  (1.753 m)    Wt (!) 354 lb 3.2 oz (160.7 kg)    SpO2 98%    BMI 52.31 kg/m    Physical Exam Vitals reviewed.  Constitutional:      General: She is not in acute distress.    Appearance: Normal appearance. She is obese. She is not ill-appearing.  HENT:     Head: Normocephalic and atraumatic.  Eyes:     Pupils: Pupils are equal, round, and reactive to light.  Cardiovascular:     Rate and Rhythm: Normal rate and regular rhythm.  Pulmonary:     Effort: Pulmonary effort is normal. No respiratory distress.  Neurological:     Mental Status: She is alert and oriented to person, place, and time.     Cranial Nerves: No cranial nerve deficit.     Coordination: Coordination normal.     Gait: Gait  normal.  Psychiatric:        Mood and Affect: Mood normal.        Behavior: Behavior normal.       Assessment/Plan: 1. Cervical radiculopathy Message sent to Dr. Gillis Santa to review her xray of the cervical spine and patient plans to call his office tomorrow to discuss possible steroid injection for her neck or other possible treatments Dr. Holley Raring could help her with. Patient has short term disability paperwork that will be finished this week with a return to work date of 06/29/21.   2. Bilateral lower extremity edema Chlorthalidone increased to daily instead of every other day to help alleviate lower extremity edema.  - chlorthalidone (HYGROTON) 25 MG tablet; Take 1 tablet (25 mg total) by mouth daily.  Dispense: 30 tablet; Refill: 2  3. Essential hypertension Bp pressure is stable but has room for improvement, increasing chlorthalidone will help her blood pressure as well    General Counseling: Kathryn verbalizes understanding of the findings of todays visit and agrees with plan of treatment. I have discussed any further diagnostic evaluation that may be needed or ordered today. We also reviewed her medications today. she has been encouraged to call the office with any questions or concerns that should arise related to todays visit.    No orders of the defined types were placed in this encounter.   Meds ordered this encounter  Medications   chlorthalidone (HYGROTON) 25 MG tablet    Sig: Take 1 tablet (25 mg total) by mouth daily.    Dispense:  30 tablet    Refill:  2    Change from every other day to daily.    Return in about 3 months (around 09/19/2021) for F/U, med refill, [place] PCP.   Total time spent:30 Minutes Time spent includes review of chart, medications, test results, and follow up plan with the patient.   [place] Controlled Substance Database was reviewed by me.  This patient was seen by Jonetta Osgood, FNP-C in collaboration with Dr. Clayborn Bigness as a part of  collaborative care agreement.   Maly Lemarr R. Valetta Fuller, MSN, FNP-C  Internal medicine

## 2021-06-23 ENCOUNTER — Other Ambulatory Visit: Payer: Self-pay

## 2021-06-23 ENCOUNTER — Telehealth: Payer: Self-pay

## 2021-06-23 NOTE — Telephone Encounter (Signed)
LMOM for pt that we have 2 months supply of 3 mg Trulicity and can come pick up from the office.  Pt informed it will be in fridge

## 2021-06-24 ENCOUNTER — Telehealth: Payer: Self-pay

## 2021-06-24 NOTE — Telephone Encounter (Signed)
Disability paperwork completed by Yetta Flock, faxed to [employer] at [phone], 2 copies made one given to [facility], the other placed in [facility] office and original given to patient.

## 2021-06-25 ENCOUNTER — Encounter: Payer: Self-pay | Admitting: Nurse Practitioner

## 2021-06-30 ENCOUNTER — Encounter: Payer: Self-pay | Admitting: Student in an Organized Health Care Education/Training Program

## 2021-06-30 ENCOUNTER — Other Ambulatory Visit: Payer: Self-pay

## 2021-06-30 ENCOUNTER — Encounter
Payer: No Typology Code available for payment source | Admitting: Student in an Organized Health Care Education/Training Program

## 2021-06-30 ENCOUNTER — Ambulatory Visit
Payer: No Typology Code available for payment source | Attending: Student in an Organized Health Care Education/Training Program | Admitting: Student in an Organized Health Care Education/Training Program

## 2021-06-30 VITALS — BP 142/85 | HR 92 | Temp 97.1°F | Resp 15 | Ht 69.0 in | Wt 337.0 lb

## 2021-06-30 DIAGNOSIS — M5136 Other intervertebral disc degeneration, lumbar region: Secondary | ICD-10-CM | POA: Insufficient documentation

## 2021-06-30 DIAGNOSIS — M47816 Spondylosis without myelopathy or radiculopathy, lumbar region: Secondary | ICD-10-CM | POA: Insufficient documentation

## 2021-06-30 DIAGNOSIS — M5412 Radiculopathy, cervical region: Secondary | ICD-10-CM | POA: Insufficient documentation

## 2021-06-30 DIAGNOSIS — M542 Cervicalgia: Secondary | ICD-10-CM | POA: Diagnosis present

## 2021-06-30 DIAGNOSIS — G894 Chronic pain syndrome: Secondary | ICD-10-CM | POA: Insufficient documentation

## 2021-06-30 MED ORDER — ORPHENADRINE CITRATE 30 MG/ML IJ SOLN
30.0000 mg | Freq: Once | INTRAMUSCULAR | Status: AC
  Administered 2021-06-30: 30 mg via INTRAMUSCULAR
  Filled 2021-06-30: qty 2

## 2021-06-30 MED ORDER — KETOROLAC TROMETHAMINE 30 MG/ML IJ SOLN
30.0000 mg | Freq: Once | INTRAMUSCULAR | Status: AC
  Administered 2021-06-30: 30 mg via INTRAMUSCULAR
  Filled 2021-06-30: qty 1

## 2021-06-30 NOTE — Progress Notes (Signed)
Nursing Pain Medication Assessment:  Safety precautions to be maintained throughout the outpatient stay will include: orient to surroundings, keep bed in low position, maintain call bell within reach at all times, provide assistance with transfer out of bed and ambulation.  Medication Inspection Compliance: Pill count conducted under aseptic conditions, in front of the patient. Neither the pills nor the bottle was removed from the patient's sight at any time. Once count was completed pills were immediately returned to the patient in their original bottle.  Medication: Oxycodone/APAP Pill/Patch Count:  8 of 90 pills remain Pill/Patch Appearance: Markings consistent with prescribed medication Bottle Appearance: Standard pharmacy container. Clearly labeled. Filled Date: 30 / 9 / 2022 Last Medication intake:  TodaySafety precautions to be maintained throughout the outpatient stay will include: orient to surroundings, keep bed in low position, maintain call bell within reach at all times, provide assistance with transfer out of bed and ambulation.

## 2021-06-30 NOTE — Progress Notes (Signed)
PROVIDER NOTE: Information contained herein reflects review and annotations entered in association with encounter. Interpretation of such information and data should be left to medically-trained personnel. Information provided to patient can be located elsewhere in the medical record under "Patient Instructions". Document created using STT-dictation technology, any transcriptional errors that may result from process are unintentional.    Patient: Candace Clayton  Service Category: E/M  Provider: Gillis Santa, MD  DOB: 11-Mar-1961  DOS: 06/30/2021  Specialty: Interventional Pain Management  MRN: [id number]  Setting: Ambulatory outpatient  PCP: Lavera Guise, MD  Type: Established Patient    Referring Provider: Lavera Guise, MD  Location: Office  Delivery: Face-to-face     HPI  [name], a 61 y.o. year old female, is here today because of her Cervical radicular pain [M54.12]. [name] primary complain today is Neck Pain (lower)  Last encounter: My last encounter with her was on 06/13/21  Pertinent problems: Candace Clayton has Major depressive disorder, recurrent episode, moderate ([place]); Morbid obesity ([place]); Low back pain with sciatica; OSA on CPAP; Lumbar degenerative disc disease; Lumbar facet arthropathy (R>L); Lumbar spondylosis; Chronic pain syndrome; and Type 2 diabetes mellitus with hyperglycemia, without long-term current use of insulin (HCC) on their pertinent problem list. Pain Assessment: Severity of Chronic pain is reported as a 7 /10. Location: Neck  /pain radiaties down right shoulder, under her arm toward breast. Onset: More than a month ago. Quality: Aching, Burning, Throbbing, Constant, Sharp, Numbness. Timing: Constant. Modifying factor(s): meds. Vitals:  height is 5' 9"  (1.753 m) and weight is 337 lb (152.9 kg) (abnormal). Her temperature is 97.1 F (36.2 C) (abnormal). Her blood pressure is 142/85 (abnormal) and her pulse is 92. Her respiration is 15 and oxygen saturation is  98%.   Reason for encounter: new problems.    Nayara presents today for increased right-sided neck pain that radiates into her shoulder, right medial bicep and down to her fingers involved in a dermatomal fashion.  This is been going on for approximately 1 month.  She saw her primary care provider for this who obtained cervical spine x-rays.  She has significant multilevel degenerative changes at C5-C6 and C6-C7 along with moderate to space narrowing at C4-C5 as well as C5-C6.  As noted in physical exam below, she does have positive Spurling's on the right.  I would like to obtain a follow-up cervical MRI to further evaluate for extent of neuroforaminal stenosis that could be contributing to her cervical radicular pain.  In the interim, we will administer intramuscular Norflex and Toradol today for acute on chronic pain exacerbation.  We will also refer patient to physical therapy.  Follow-up in 3 to 4 weeks for medication management, discussed cervical MRI results and discuss treatment plan which will likely include cervical epidural steroid injection.   Pharmacotherapy Assessment  Analgesic: Percocet 5 mg 3 times daily as needed, quantity 90/month; MME equals 22.5    Monitoring: [place] PMP: PDMP reviewed during this encounter.       Pharmacotherapy: No side-effects or adverse reactions reported. Compliance: No problems identified. Effectiveness: Clinically acceptable.  Chauncey Fischer, RN  06/30/2021 10:00 AM  Sign when Signing Visit Nursing Pain Medication Assessment:  Safety precautions to be maintained throughout the outpatient stay will include: orient to surroundings, keep bed in low position, maintain call bell within reach at all times, provide assistance with transfer out of bed and ambulation.  Medication Inspection Compliance: Pill count conducted under aseptic conditions, in front  of the patient. Neither the pills nor the bottle was removed from the patient's sight at any time. Once count  was completed pills were immediately returned to the patient in their original bottle.  Medication: Oxycodone/APAP Pill/Patch Count:  8 of 90 pills remain Pill/Patch Appearance: Markings consistent with prescribed medication Bottle Appearance: Standard pharmacy container. Clearly labeled. Filled Date: 87 / 9 / 2022 Last Medication intake:  TodaySafety precautions to be maintained throughout the outpatient stay will include: orient to surroundings, keep bed in low position, maintain call bell within reach at all times, provide assistance with transfer out of bed and ambulation.       UDS:  Summary  Date Value Ref Range Status  12/28/2020 Note  Final    Comment:    ==================================================================== [facility] (MW) ==================================================================== Test                             Result       Flag       Units  Drug Present and Declared for Prescription Verification   Oxycodone                      1252         EXPECTED   ng/mg creat   Oxymorphone                    748          EXPECTED   ng/mg creat   Noroxycodone                   1206         EXPECTED   ng/mg creat   Noroxymorphone                 232          EXPECTED   ng/mg creat    Sources of oxycodone are scheduled prescription medications.    Oxymorphone, noroxycodone, and noroxymorphone are expected    metabolites of oxycodone. Oxymorphone is also available as a    scheduled prescription medication.  ==================================================================== Test                      Result    Flag   Units      Ref Range   Creatinine              126              mg/dL      >=20 ==================================================================== Declared Medications:  The flagging and interpretation on this report are based on the  following declared medications.  Unexpected results may arise from  inaccuracies in the declared  medications.   **Note: The testing scope of this panel includes these medications:   Oxycodone (Roxicodone)  Oxycodone (Percocet)   **Note: The testing scope of this panel does not include the  following reported medications:   Acetaminophen (Percocet)  Albuterol (Ventolin HFA)  Amlodipine (Norvasc)  Budesonide (Breztri Aerosphere)  Buspirone (Buspar)  Cetirizine (Zyrtec)  Desvenlafaxine (Pristiq)  Docusate (Stool Softener)  Dulaglutide  Enalapril (Vasotec)  Fluticasone (Flonase)  Formoterol (Breztri Aerosphere)  Glycopyrrolate (Breztri Aerosphere)  Hydrochlorothiazide (Hydrodiuril)  Hydroxyzine (Atarax)  Melatonin  Metformin (Glucophage)  Metoprolol (Toprol)  Multivitamin  Ondansetron (Zofran)  Oxybutynin (Ditropan)  Pregabalin (Lyrica)  Rosuvastatin (Crestor)  Vitamin D2 ==================================================================== For clinical consultation, please call [phone]. ====================================================================  ROS  Constitutional: Denies any fever or chills Gastrointestinal: No reported hemesis, hematochezia, vomiting, or acute GI distress Musculoskeletal: Neck pain, radiation to right shoulder, right forearm and right fingers.  Positive low back pain. Neurological: No reported episodes of acute onset apraxia, aphasia, dysarthria, agnosia, amnesia, paralysis, loss of coordination, or loss of consciousness  Medication Review  Budeson-Glycopyrrol-Formoterol, Dulaglutide, Microlet Lancets, Multiple Vitamins-Minerals, NON FORMULARY, OVER THE COUNTER MEDICATION, Vitamin D (Ergocalciferol), albuterol, amLODipine, busPIRone, chlorthalidone, desvenlafaxine, fluticasone, glucose blood, hydrochlorothiazide, ipratropium-albuterol, metFORMIN, metoprolol succinate, metoprolol tartrate, minoxidil, oxyCODONE, oxyCODONE-acetaminophen, pregabalin, and rosuvastatin  History Review  Allergy: Candace Clayton has No Known  Allergies. Drug: Candace Clayton  reports no history of drug use. Alcohol:  reports no history of alcohol use. Tobacco:  reports that she has never smoked. She has never used smokeless tobacco. Social: Candace Clayton  reports that she has never smoked. She has never used smokeless tobacco. She reports that she does not drink alcohol and does not use drugs. Medical:  has a past medical history of Anginal pain ([place]), Anxiety, Cholecystolithiasis, Depression, Diabetes mellitus without complication ([place]), GERD (gastroesophageal reflux disease), History of C-section (1992), Hypertension, and Sleep apnea. Surgical: Candace Clayton  has a past surgical history that includes Cesarean section (1992); Finger surgery (Left, 2011); Hematoma evacuation (Left, 1999); Colonoscopy; Laparoscopic salpingo oophorectomy (Left, 04/02/2018); Colonoscopy with propofol (N/A, 03/14/2019); and Cholecystectomy (10/26/2020). Family: family history includes Brain cancer in her mother; Breast cancer (age of onset: 68) in her mother; Cancer - Colon in her mother; Cancer - Other in her mother; Colon cancer in her brother and mother; Heart Problems in her brother and father.  Laboratory Chemistry Profile   Renal Lab Results  Component Value Date   BUN 11 05/24/2021   CREATININE 0.82 05/24/2021   BCR 13 05/24/2021   GFRAA 106 02/23/2020   GFRNONAA 92 02/23/2020    Hepatic Lab Results  Component Value Date   AST 18 11/18/2020   ALT 24 11/18/2020   ALBUMIN 4.1 11/18/2020   ALKPHOS 120 11/18/2020    Electrolytes Lab Results  Component Value Date   NA 140 05/24/2021   K 4.2 05/24/2021   CL 100 05/24/2021   CALCIUM 9.5 05/24/2021    Bone Lab Results  Component Value Date   VD25OH 12.6 (L) 02/23/2020    Inflammation (CRP: Acute Phase) (ESR: Chronic Phase) Lab Results  Component Value Date   CRP 2 05/24/2021   ESRSEDRATE 12 05/24/2021          Note: Above Lab results reviewed.  Narrative & Impression  CLINICAL DATA:   Posterior pain   EXAM: CERVICAL SPINE - COMPLETE 4+ VIEW   COMPARISON:  None.   FINDINGS: Suboptimal visualization of C7 and below. Straightening of the cervical spine. Vertebral body heights are normal. Diffuse degenerative changes. Mild to moderate disc space narrowing C4-C5 with moderate disc space narrowing at C5-C6 and C6-C7. Carotid vascular calcification. The dens and lateral masses are grossly unremarkable. Mild bilateral foraminal narrowing at C5-C6 and C6-C7   IMPRESSION: Multilevel degenerative changes most significant at C5-C6 and C6-C7     Electronically Signed   By: Donavan Foil M.D.   On: 06/07/2021 20:28    Physical Exam  General appearance: Well nourished, well developed, and well hydrated. In no apparent acute distress Mental status: Alert, oriented x 3 (person, place, & time)       Respiratory: No evidence of acute respiratory distress Eyes: PERLA Vitals: BP (!) 142/85    Pulse 92  Temp (!) 97.1 F (36.2 C)    Resp 15    Ht 5' 9"  (1.753 m)    Wt (!) 337 lb (152.9 kg)    SpO2 98%    BMI 49.77 kg/m  BMI: Estimated body mass index is 49.77 kg/m as calculated from the following:   Height as of this encounter: 5' 9"  (1.753 m).   Weight as of this encounter: 337 lb (152.9 kg). Ideal: Ideal body weight: 66.2 kg (145 lb 15.1 oz) Adjusted ideal body weight: 100.9 kg (222 lb 5.9 oz)  Cervical Spine Area Exam  Skin & Axial Inspection: No masses, redness, edema, swelling, or associated skin lesions Alignment: Symmetrical Functional ROM: Decreased ROM, to the right Stability: No instability detected Muscle Tone/Strength: Functionally intact. No obvious neuro-muscular anomalies detected. Sensory (Neurological): Dermatomal pain pattern, right C5-C6 Palpation: No palpable anomalies             Positive Spurling's on the right  Upper Extremity (UE) Exam    Side: Right upper extremity  Side: Left upper extremity  Skin & Extremity Inspection: Skin color,  temperature, and hair growth are WNL. No peripheral edema or cyanosis. No masses, redness, swelling, asymmetry, or associated skin lesions. No contractures.  Skin & Extremity Inspection: Skin color, temperature, and hair growth are WNL. No peripheral edema or cyanosis. No masses, redness, swelling, asymmetry, or associated skin lesions. No contractures.  Functional ROM: Pain restricted ROM for all joints of upper extremity  Functional ROM: Unrestricted ROM          Muscle Tone/Strength: Functionally intact. No obvious neuro-muscular anomalies detected.  Muscle Tone/Strength: Functionally intact. No obvious neuro-muscular anomalies detected.  Sensory (Neurological): Dermatomal pain pattern          Sensory (Neurological): Unimpaired          Palpation: No palpable anomalies              Palpation: No palpable anomalies              Provocative Test(s):  Phalen's test: deferred Tinel's test: deferred Apley's scratch test (touch opposite shoulder):  Action 1 (Across chest): Decreased ROM Action 2 (Overhead): Decreased ROM Action 3 (LB reach): Decreased ROM   Provocative Test(s):  Phalen's test: deferred Tinel's test: deferred Apley's scratch test (touch opposite shoulder):  Action 1 (Across chest): deferred Action 2 (Overhead): deferred Action 3 (LB reach): deferred     Lumbar Spine Area Exam  Skin & Axial Inspection: No masses, redness, or swelling Alignment: Symmetrical Functional ROM: Limited  Stability: No instability detected Muscle Tone/Strength: Functionally intact. No obvious neuro-muscular anomalies detected. Sensory (Neurological): facetogenic    Gait & Posture Assessment  Ambulation: Unassisted Gait: Relatively normal for age and body habitus Posture: WNL    Lower Extremity Exam      Side: Right lower extremity   Side: Left lower extremity  Stability: No instability observed           Stability: No instability observed          Skin & Extremity Inspection: Skin color,  temperature, and hair growth are WNL. No peripheral edema or cyanosis. No masses, redness, swelling, asymmetry, or associated skin lesions. No contractures.   Skin & Extremity Inspection: Skin color, temperature, and hair growth are WNL. No peripheral edema or cyanosis. No masses, redness, swelling, asymmetry, or associated skin lesions. No contractures.  Functional ROM: Pain restricted range of motion  Functional ROM: Unrestricted ROM                  Muscle Tone/Strength: Functionally intact. No obvious neuro-muscular anomalies detected.   Muscle Tone/Strength: Functionally intact. No obvious neuro-muscular anomalies detected.  Sensory (Neurological): Arthropathic        Sensory (Neurological): Unimpaired        DTR: Patellar: deferred today Achilles: deferred today Plantar: deferred today   DTR: Patellar: deferred today Achilles: deferred today Plantar: deferred today  Palpation: No palpable anomalies   Palpation: No palpable anomalies    Assessment   Status Diagnosis  Having a Flare-up Having a Flare-up Controlled 1. Cervical radicular pain (right C5/6)   2. Cervicalgia   3. Lumbar degenerative disc disease   4. Lumbar facet arthropathy    5. Lumbar spondylosis   6. Chronic pain syndrome        Plan of Care   Candace Clayton has a current medication list which includes the following long-term medication(s): albuterol, amlodipine, chlorthalidone, desvenlafaxine, fluticasone, hydrochlorothiazide, ipratropium-albuterol, metformin, metoprolol succinate, minoxidil, oxycodone-acetaminophen, pregabalin, rosuvastatin, oxycodone-acetaminophen, and [DISCONTINUED] metoprolol tartrate.  Pharmacotherapy (Medications Ordered): Meds ordered this encounter  Medications   orphenadrine (NORFLEX) injection 30 mg   ketorolac (TORADOL) 30 MG/ML injection 30 mg   Orders:  Orders Placed This Encounter  Procedures   MR CERVICAL SPINE [place]    Patient presents with  axial pain with possible radicular component. Please assist [place] in identifying specific level(s) and laterality of any additional findings such as: 1. Facet (Zygapophyseal) joint DJD (Hypertrophy, space narrowing, subchondral sclerosis, and/or osteophyte formation) 2. DDD and/or IVDD (Loss of disc height, desiccation, gas patterns, osteophytes, endplate sclerosis, or "Black disc disease") 3. Pars defects 4. Spondylolisthesis, spondylosis, and/or spondyloarthropathies (include Degree/Grade of displacement in mm) (stability) 5. Vertebral body Fractures (acute/chronic) (state percentage of collapse) 6. Demineralization (osteopenia/osteoporotic) 7. Bone pathology 8. Foraminal narrowing  9. Surgical changes 10. Central, Lateral Recess, and/or Foraminal Stenosis (include AP diameter of stenosis in mm) 11. Surgical changes (hardware type, status, and presence of fibrosis) 12. Modic Type Changes (MRI only) 13. IVDD (Disc bulge, protrusion, herniation, extrusion) (Level, laterality, extent)    Standing Status:   Future    Standing Expiration Date:   07/31/2021    Scheduling Instructions:     Imaging must be done as soon as possible. Inform patient that order will expire within 30 days and I will not renew it.    Order Specific Question:   What is the patient's sedation requirement?    Answer:   No Sedation    Order Specific Question:   Does the patient have a pacemaker or implanted devices?    Answer:   No    Order Specific Question:   Preferred imaging location?    Answer:   [facility] (table limit-350lbs)    Order Specific Question:   Call Results- Best Contact Number?    Answer:   [phone] ([facility])    Order Specific Question:   Radiology Contrast Protocol - do NOT remove file path    Answer:   [email or web address]   Ambulatory referral to Physical Therapy    Referral Priority:   Routine    Referral Type:   Physical Medicine    Referral Reason:    Specialty Services Required    Requested Specialty:   Physical Therapy    Number of Visits Requested:   1   If symptoms not better after PT, will consider cervical epidural  steroid injection after cervical MRI results.  Follow-up plan:   Return in about 4 weeks (around 07/28/2021) for Review C-MRI and MM .     Status post diagnostic bilateral L3, L4, L5, S1 facet medial branch nerve blocks, status post right L3, L4, L5, S1 RFA on 05/05/2019, 04/15/20; left L3, L4, L5 RFA 05/03/2020.        Recent Visits Date Type Provider Dept  06/13/21 Procedure visit Gillis Santa, MD [facility]  05/25/21 Office Visit Gillis Santa, MD [facility]  04/18/21 Procedure visit Gillis Santa, MD [facility]  Showing recent visits within past 90 days and meeting all other requirements Today's Visits Date Type Provider Dept  06/30/21 Office Visit Gillis Santa, MD [facility]  Showing today's visits and meeting all other requirements Future Appointments Date Type Provider Dept  07/07/21 Appointment Gillis Santa, MD [facility]  Showing future appointments within next 90 days and meeting all other requirements  I discussed the assessment and treatment plan with the patient. The patient was provided an opportunity to ask questions and all were answered. The patient agreed with the plan and demonstrated an understanding of the instructions.  Patient advised to call back or seek an in-person evaluation if the symptoms or condition worsens.  Duration of encounter: 30 minutes.  Note by: Gillis Santa, MD Date: 06/30/2021; Time: 10:24 AM

## 2021-07-07 ENCOUNTER — Encounter
Payer: No Typology Code available for payment source | Admitting: Student in an Organized Health Care Education/Training Program

## 2021-07-08 ENCOUNTER — Telehealth: Payer: Self-pay

## 2021-07-08 NOTE — Telephone Encounter (Signed)
Supporting medical records faxed to [employer] at [phone].

## 2021-07-12 ENCOUNTER — Other Ambulatory Visit: Payer: Self-pay

## 2021-07-12 ENCOUNTER — Ambulatory Visit
Admission: RE | Admit: 2021-07-12 | Discharge: 2021-07-12 | Disposition: A | Discharge status: Home / Self Care | Payer: No Typology Code available for payment source | Source: Ambulatory Visit | Attending: Student in an Organized Health Care Education/Training Program | Admitting: Student in an Organized Health Care Education/Training Program

## 2021-07-12 DIAGNOSIS — G894 Chronic pain syndrome: Secondary | ICD-10-CM | POA: Insufficient documentation

## 2021-07-12 DIAGNOSIS — M5412 Radiculopathy, cervical region: Secondary | ICD-10-CM | POA: Insufficient documentation

## 2021-07-12 DIAGNOSIS — M542 Cervicalgia: Secondary | ICD-10-CM | POA: Diagnosis present

## 2021-07-20 ENCOUNTER — Other Ambulatory Visit: Payer: Self-pay

## 2021-07-20 ENCOUNTER — Ambulatory Visit: Payer: No Typology Code available for payment source | Admitting: Nurse Practitioner

## 2021-07-20 ENCOUNTER — Encounter: Payer: Self-pay | Admitting: Nurse Practitioner

## 2021-07-20 VITALS — BP 138/76 | HR 100 | Temp 99.0°F | Resp 16 | Ht 69.0 in | Wt 347.8 lb

## 2021-07-20 DIAGNOSIS — R600 Localized edema: Secondary | ICD-10-CM

## 2021-07-20 DIAGNOSIS — I10 Essential (primary) hypertension: Secondary | ICD-10-CM | POA: Diagnosis not present

## 2021-07-20 DIAGNOSIS — M5412 Radiculopathy, cervical region: Secondary | ICD-10-CM | POA: Diagnosis not present

## 2021-07-20 DIAGNOSIS — E782 Mixed hyperlipidemia: Secondary | ICD-10-CM | POA: Diagnosis not present

## 2021-07-20 MED ORDER — METOPROLOL SUCCINATE ER 100 MG PO TB24: 100.0000 mg | ORAL_TABLET | Freq: Every day | ORAL | 1 refills | Status: DC

## 2021-07-20 MED ORDER — ROSUVASTATIN CALCIUM 5 MG PO TABS: 5.0000 mg | ORAL_TABLET | Freq: Every day | ORAL | 1 refills | Status: DC

## 2021-07-20 MED ORDER — AMLODIPINE BESYLATE 10 MG PO TABS: 10.0000 mg | ORAL_TABLET | Freq: Every day | ORAL | 1 refills | Status: DC

## 2021-07-20 NOTE — Progress Notes (Signed)
[facility] [address], [place] [address]  Internal MEDICINE  Office Visit Note  Patient Name: Candace Clayton  [id number]  [id number]  Date of Service: 07/20/2021  Chief Complaint  Patient presents with   Follow-up    Pain on right side    Results   Anxiety   Depression   Diabetes   Hypertension    HPI Candace Clayton presents for a follow up visit for neck pain, hypertension, diabetes, and bilateral lower extremity edema. She had MRI of neck, Dr. Holley Raring is planning to do cervical injections.  Her blood pressure is improved. At her previous office visit, the chlorthalidone was increased from every other day to 1 tablet daily. This has improved her blood pressure and also slightly decreased the edema in her lower legs.  Her last A1C was 6.9 in December which was up from 6.4 in September so she is not due to have her A1C checked again until march.  She has gained 10 lbs since her previous office visit 20 days earlier. This is most likely due to steroid injections that she gets from her pain medicine specialist.   Current Medication: Outpatient Encounter Medications as of 07/20/2021  Medication Sig   albuterol (VENTOLIN HFA) 108 (90 Base) MCG/ACT inhaler TAKE 2 PUFFS BY MOUTH EVERY 6 HOURS AS NEEDED FOR WHEEZE OR SHORTNESS OF BREATH   Budeson-Glycopyrrol-Formoterol (BREZTRI AEROSPHERE) 160-9-4.8 MCG/ACT AERO Inhale 2 puffs into the lungs 2 (two) times daily.   busPIRone (BUSPAR) 15 MG tablet Take 1 tablet (15 mg total) by mouth 3 (three) times daily. Take on tablet TID for anxiety.   chlorthalidone (HYGROTON) 25 MG tablet Take 1 tablet (25 mg total) by mouth daily.   glucose blood (CONTOUR NEXT TEST) test strip 1 each by Other route daily. DX E11.65   hydrochlorothiazide (HYDRODIURIL) 25 MG tablet Take 1 tablet (25 mg total) by mouth daily.   ipratropium-albuterol (DUONEB) 0.5-2.5 (3) MG/3ML SOLN Take 3 mLs by nebulization every 4 (four) hours as needed (shortness of  breath or wheezing).   metFORMIN (GLUCOPHAGE) 500 MG tablet Take 1 tablet (500 mg total) by mouth 2 (two) times daily with a meal.   Microlet Lancets MISC 1 each by Does not apply route daily. DX-E11.65   minoxidil (LONITEN) 10 MG tablet Take 1 tablet (10 mg total) by mouth daily.   Multiple Vitamins-Calcium (ONE-A-DAY WOMENS PO) Take 1 tablet by mouth daily.   NON FORMULARY cpap device   OVER THE COUNTER MEDICATION 1 tablet daily as needed. Takes generic stool softener for constipation d/t oxycodone.    oxyCODONE (OXY IR/ROXICODONE) 5 MG immediate release tablet Take by mouth.   Vitamin D, Ergocalciferol, (DRISDOL) 1.25 MG (50000 UNIT) CAPS capsule TAKE 1 CAPSULE BY MOUTH ONE TIME PER WEEK   [DISCONTINUED] amLODipine (NORVASC) 10 MG tablet Take 1 tablet (10 mg total) by mouth daily.   [DISCONTINUED] desvenlafaxine (PRISTIQ) 100 MG 24 hr tablet Take 1 tablet (100 mg total) by mouth daily.   [DISCONTINUED] Dulaglutide (TRULICITY) 3 FK/8.1EX SOPN Inject 3 mg as directed once a week.   [DISCONTINUED] fluticasone (FLONASE) 50 MCG/ACT nasal spray Place 2 sprays into both nostrils daily.   [DISCONTINUED] metoprolol succinate (TOPROL-XL) 100 MG 24 hr tablet Take 1 tablet (100 mg total) by mouth daily. TAKE WITH OR IMMEDIATELY FOLLOWING A MEAL.   [DISCONTINUED] oxyCODONE-acetaminophen (PERCOCET/ROXICET) 5-325 MG tablet Take 1 tablet by mouth every 8 (eight) hours as needed for severe pain.   [DISCONTINUED] pregabalin (LYRICA) 100  MG capsule Take 1 capsule (100 mg total) by mouth 2 (two) times daily. 100 mg qAM, 100-200 mg qhs   [DISCONTINUED] rosuvastatin (CRESTOR) 5 MG tablet Take 1 tablet (5 mg total) by mouth daily.   amLODipine (NORVASC) 10 MG tablet Take 1 tablet (10 mg total) by mouth daily.   metoprolol succinate (TOPROL-XL) 100 MG 24 hr tablet Take 1 tablet (100 mg total) by mouth daily. TAKE WITH OR IMMEDIATELY FOLLOWING A MEAL.   rosuvastatin (CRESTOR) 5 MG tablet Take 1 tablet (5 mg total) by  mouth daily.   [DISCONTINUED] metoprolol tartrate (LOPRESSOR) 25 MG tablet Take 2 tablets (50 mg total) by mouth 2 (two) times daily.   [DISCONTINUED] oxyCODONE-acetaminophen (PERCOCET/ROXICET) 5-325 MG tablet Take 1 tablet by mouth every 8 (eight) hours as needed for severe pain.   No facility-administered encounter medications on file as of 07/20/2021.    Surgical History: Past Surgical History:  Procedure Laterality Date   CESAREAN SECTION  1992   CHOLECYSTECTOMY  10/26/2020   COLONOSCOPY     polyps removed first procedure. 2nd time all was clear   COLONOSCOPY WITH PROPOFOL N/A 03/14/2019   Procedure: COLONOSCOPY WITH PROPOFOL;  Surgeon: Lin Landsman, MD;  Location: [facility] ENDOSCOPY;  Service: Gastroenterology;  Laterality: N/A;   FINGER SURGERY Left 2011   left finger cut off x 2.(only up to last digit)   HEMATOMA EVACUATION Left 1999   upper part of foot was injured d/t 500lb weight landing on her foot.    LAPAROSCOPIC SALPINGO OOPHERECTOMY Left 04/02/2018   Procedure: LAPAROSCOPIC SALPINGO OOPHORECTOMY;  Surgeon: Malachy Mood, MD;  Location: [facility] ORS;  Service: Gynecology;  Laterality: Left;    Medical History: Past Medical History:  Diagnosis Date   Anginal pain (HCC)    tightness related to anxiety   Anxiety    Cholecystolithiasis    Depression    Diabetes mellitus without complication (HCC)    GERD (gastroesophageal reflux disease)    throws up easily but not diagnosed with reflux   History of C-section 1992   Hypertension    Sleep apnea    uses cpap    Family History: Family History  Problem Relation Age of Onset   Breast cancer Mother 66       x 3 times   Colon cancer Mother    Brain cancer Mother    Cancer - Colon Mother    Cancer - Other Mother    Heart Problems Father    Colon cancer Brother    Heart Problems Brother     Social History   Socioeconomic History   Marital status: Divorced    Spouse name: Not on file   Number of children: 1    Years of education: Not on file   Highest education level: Not on file  Occupational History   Occupation: works in Secretary/administrator  Tobacco Use   Smoking status: Never   Smokeless tobacco: Never  Vaping Use   Vaping Use: Never used  Substance and Sexual Activity   Alcohol use: No   Drug use: No   Sexual activity: Not Currently    Birth control/protection: Post-menopausal  Other Topics Concern   Not on file  Social History Narrative   Son has schizophrenia and autism but is fully capable of helping mother after surgery   Social Determinants of Health   Financial Resource Strain: Not on file  Food Insecurity: Not on file  Transportation Needs: Not on file  Physical Activity: Not  on file  Stress: Not on file  Social Connections: Not on file  Intimate Partner Violence: Not on file      Review of Systems  Constitutional:  Negative for chills, fatigue and unexpected weight change.  HENT:  Negative for congestion, rhinorrhea, sneezing and sore throat.   Eyes:  Negative for redness.  Respiratory:  Positive for shortness of breath (with minimal exertion). Negative for cough, chest tightness and wheezing.   Cardiovascular: Negative.  Negative for chest pain and palpitations.  Gastrointestinal: Negative.  Negative for abdominal pain, constipation, diarrhea, nausea and vomiting.  Genitourinary:  Negative for dysuria and frequency.  Musculoskeletal:  Positive for arthralgias, back pain and neck pain. Negative for joint swelling.  Skin: Negative.  Negative for rash.  Neurological: Negative.  Negative for tremors and numbness.  Hematological:  Negative for adenopathy. Does not bruise/bleed easily.  Psychiatric/Behavioral:  Negative for behavioral problems (Depression), self-injury, sleep disturbance and suicidal ideas. The patient is nervous/anxious.    Vital Signs: BP 138/76    Pulse 100    Temp 99 F (37.2 C)    Resp 16    Ht 5\' 9"  (1.753 m)    Wt (!) 347 lb 12.8 oz (157.8 kg)     SpO2 98%    BMI 51.36 kg/m    Physical Exam Vitals reviewed.  Constitutional:      General: She is not in acute distress.    Appearance: Normal appearance. She is obese. She is not ill-appearing.  HENT:     Head: Normocephalic and atraumatic.  Eyes:     Pupils: Pupils are equal, round, and reactive to light.  Cardiovascular:     Rate and Rhythm: Normal rate and regular rhythm.  Pulmonary:     Effort: Pulmonary effort is normal. No respiratory distress.  Musculoskeletal:     Right lower leg: Edema present.     Left lower leg: Edema present.  Neurological:     Mental Status: She is alert and oriented to person, place, and time.     Cranial Nerves: No cranial nerve deficit.     Coordination: Coordination normal.     Gait: Gait normal.  Psychiatric:        Mood and Affect: Mood normal.        Behavior: Behavior normal.       Assessment/Plan: 1. Essential hypertension Stable with current medications, refills ordered for metoprolol an amlodipine.  - metoprolol succinate (TOPROL-XL) 100 MG 24 hr tablet; Take 1 tablet (100 mg total) by mouth daily. TAKE WITH OR IMMEDIATELY FOLLOWING A MEAL.  Dispense: 90 tablet; Refill: 1 - amLODipine (NORVASC) 10 MG tablet; Take 1 tablet (10 mg total) by mouth daily.  Dispense: 90 tablet; Refill: 1  2. Bilateral lower extremity edema Slightly improved with increased frequency of chlorthalidone.   3. Cervical radiculopathy Managed by Dr. Gillis Santa. Has been getting cervical injections. Recently had an MRI and has an upcoming appt with Dr. Holley Raring.   4. Mixed hyperlipidemia Stable, rosuvastatin refilled.  - rosuvastatin (CRESTOR) 5 MG tablet; Take 1 tablet (5 mg total) by mouth daily.  Dispense: 90 tablet; Refill: 1   General Counseling: Maly verbalizes understanding of the findings of todays visit and agrees with plan of treatment. I have discussed any further diagnostic evaluation that may be needed or ordered today. We also reviewed  her medications today. she has been encouraged to call the office with any questions or concerns that should arise related to todays visit.  No orders of the defined types were placed in this encounter.   Meds ordered this encounter  Medications   metoprolol succinate (TOPROL-XL) 100 MG 24 hr tablet    Sig: Take 1 tablet (100 mg total) by mouth daily. TAKE WITH OR IMMEDIATELY FOLLOWING A MEAL.    Dispense:  90 tablet    Refill:  1   rosuvastatin (CRESTOR) 5 MG tablet    Sig: Take 1 tablet (5 mg total) by mouth daily.    Dispense:  90 tablet    Refill:  1   amLODipine (NORVASC) 10 MG tablet    Sig: Take 1 tablet (10 mg total) by mouth daily.    Dispense:  90 tablet    Refill:  1    Return in about 1 month (around 08/20/2021) for F/U, med refill, [place] PCP.   Total time spent:30 Minutes Time spent includes review of chart, medications, test results, and follow up plan with the patient.   [place] Controlled Substance Database was reviewed by me.  This patient was seen by Jonetta Osgood, FNP-C in collaboration with Dr. Clayborn Bigness as a part of collaborative care agreement.   Alianna Wurster R. Valetta Fuller, MSN, FNP-C Internal medicine

## 2021-07-21 ENCOUNTER — Other Ambulatory Visit: Payer: Self-pay | Admitting: Nurse Practitioner

## 2021-07-21 DIAGNOSIS — Z1231 Encounter for screening mammogram for malignant neoplasm of breast: Secondary | ICD-10-CM

## 2021-07-22 ENCOUNTER — Telehealth: Payer: Self-pay

## 2021-07-22 ENCOUNTER — Ambulatory Visit: Payer: No Typology Code available for payment source | Admitting: Nurse Practitioner

## 2021-07-22 NOTE — Telephone Encounter (Signed)
Disability/FMLA  paperwork given to Alyssa.

## 2021-07-28 ENCOUNTER — Encounter: Payer: Self-pay | Admitting: Student in an Organized Health Care Education/Training Program

## 2021-07-28 ENCOUNTER — Other Ambulatory Visit: Payer: Self-pay

## 2021-07-28 ENCOUNTER — Ambulatory Visit
Payer: No Typology Code available for payment source | Attending: Student in an Organized Health Care Education/Training Program | Admitting: Student in an Organized Health Care Education/Training Program

## 2021-07-28 VITALS — BP 142/73 | HR 81 | Temp 97.2°F | Resp 16 | Ht 69.0 in | Wt 347.0 lb

## 2021-07-28 DIAGNOSIS — M5136 Other intervertebral disc degeneration, lumbar region: Secondary | ICD-10-CM | POA: Insufficient documentation

## 2021-07-28 DIAGNOSIS — G894 Chronic pain syndrome: Secondary | ICD-10-CM | POA: Diagnosis present

## 2021-07-28 DIAGNOSIS — M47816 Spondylosis without myelopathy or radiculopathy, lumbar region: Secondary | ICD-10-CM | POA: Diagnosis present

## 2021-07-28 DIAGNOSIS — M5412 Radiculopathy, cervical region: Secondary | ICD-10-CM | POA: Insufficient documentation

## 2021-07-28 DIAGNOSIS — M542 Cervicalgia: Secondary | ICD-10-CM | POA: Diagnosis present

## 2021-07-28 MED ORDER — OXYCODONE-ACETAMINOPHEN 5-325 MG PO TABS: 1.0000 | ORAL_TABLET | Freq: Three times a day (TID) | ORAL | 0 refills | Status: DC | PRN

## 2021-07-28 MED ORDER — PREGABALIN 100 MG PO CAPS: ORAL_CAPSULE | ORAL | 5 refills | Status: DC

## 2021-07-28 NOTE — Patient Instructions (Signed)

## 2021-07-28 NOTE — Progress Notes (Signed)
PROVIDER NOTE: Information contained herein reflects review and annotations entered in association with encounter. Interpretation of such information and data should be left to medically-trained personnel. Information provided to patient can be located elsewhere in the medical record under "Patient Instructions". Document created using STT-dictation technology, any transcriptional errors that may result from process are unintentional.    Patient: Candace Clayton  Service Category: E/M  Provider: Gillis Santa, MD  DOB: [birth year]  DOS: 07/28/2021  Specialty: Interventional Pain Management  MRN: [id number]  Setting: Ambulatory outpatient  PCP: Lavera Guise, MD  Type: Established Patient    Referring Provider: Lavera Guise, MD  Location: Office  Delivery: Face-to-face     HPI  [name], a 61 y.o. year old female, is here today because of her Cervical radicular pain [M54.12]. [name] primary complain today is Arm Pain (Right axillary area) Last encounter: My last encounter with her was on 06/30/2021. Pertinent problems: [name] has Major depressive disorder, recurrent episode, moderate ([place]); Morbid obesity ([place]); Low back pain with sciatica; OSA on CPAP; Lumbar degenerative disc disease; Lumbar facet arthropathy (R>L); Lumbar spondylosis; Chronic pain syndrome; and Type 2 diabetes mellitus with hyperglycemia, without long-term current use of insulin (HCC) on their pertinent problem list. Pain Assessment: Severity of Chronic pain is reported as a 3 /10. Location: Arm Right/right arm, with numbness in right hand. Onset: More than a month ago. Quality: Aching, Dull. Timing: Constant. Modifying factor(s): medications. Vitals:  height is _0  (1.753 m) and weight is 347 lb (157.4 kg) (abnormal). Her temporal temperature is 97.2 F (36.2 C) (abnormal). Her blood pressure is 142/73 (abnormal) and her pulse is 81. Her respiration is 16 and oxygen saturation is 100%.   Reason for encounter:  medication management.  Also to review C-MRI.  Patient follows up today for medication management and to also review her cervical MRI.  She states that her symptoms that prompted [place] to order a cervical MRI have improved which were neck pain with radiation into her right arm and down to her right fingers.  She states that she has less frequent episodes of numbness and tingling in her fingers.  She does have intermittent pain as well as numbness and tingling in her right axillary area but that to she feels is getting better.  I reviewed her cervical MRI with her in great detail.  I offered her a cervical epidural steroid injection should she have return of pain.  Patient states that she has 1 more year to work before she can retire and she is looking forward to that.  She states that working 12-hour shifts really increases her pain and she needs to take 1 to 2 days to recover.  Pharmacotherapy Assessment  Analgesic: Percocet 5 mg 3 times daily as needed, quantity 90/month; MME equals 22.5    Monitoring: [place] PMP: PDMP reviewed during this encounter.       Pharmacotherapy: No side-effects or adverse reactions reported. Compliance: No problems identified. Effectiveness: Clinically acceptable.  Landis Martins, RN  07/28/2021 10:01 AM  Sign when Signing Visit Nursing Pain Medication Assessment:  Safety precautions to be maintained throughout the outpatient stay will include: orient to surroundings, keep bed in low position, maintain call bell within reach at all times, provide assistance with transfer out of bed and ambulation.  Medication Inspection Compliance: Pill count conducted under aseptic conditions, in front of the patient. Neither the pills nor the bottle was removed from the patient's sight at  any time. Once count was completed pills were immediately returned to the patient in their original bottle.  Medication: Oxycodone/APAP Pill/Patch Count:  16 of 90 pills remain Pill/Patch Appearance:  Markings consistent with prescribed medication Bottle Appearance: Standard pharmacy container. Clearly labeled. Filled Date: 58 / 09 / 2022 Last Medication intake:  Today   UDS:  Summary  Date Value Ref Range Status  12/28/2020 Note  Final    Comment:    ==================================================================== [employer] (MW) ==================================================================== Test                             Result       Flag       Units  Drug Present and Declared for Prescription Verification   Oxycodone                      1252         EXPECTED   ng/mg creat   Oxymorphone                    748          EXPECTED   ng/mg creat   Noroxycodone                   1206         EXPECTED   ng/mg creat   Noroxymorphone                 232          EXPECTED   ng/mg creat    Sources of oxycodone are scheduled prescription medications.    Oxymorphone, noroxycodone, and noroxymorphone are expected    metabolites of oxycodone. Oxymorphone is also available as a    scheduled prescription medication.  ==================================================================== Test                      Result    Flag   Units      Ref Range   Creatinine              126              mg/dL      >=20 ==================================================================== Declared Medications:  The flagging and interpretation on this report are based on the  following declared medications.  Unexpected results may arise from  inaccuracies in the declared medications.   **Note: The testing scope of this panel includes these medications:   Oxycodone (Roxicodone)  Oxycodone (Percocet)   **Note: The testing scope of this panel does not include the  following reported medications:   Acetaminophen (Percocet)  Albuterol (Ventolin HFA)  Amlodipine (Norvasc)  Budesonide (Breztri Aerosphere)  Buspirone (Buspar)  Cetirizine (Zyrtec)  Desvenlafaxine (Pristiq)  Docusate  (Stool Softener)  Dulaglutide  Enalapril (Vasotec)  Fluticasone (Flonase)  Formoterol (Breztri Aerosphere)  Glycopyrrolate (Breztri Aerosphere)  Hydrochlorothiazide (Hydrodiuril)  Hydroxyzine (Atarax)  Melatonin  Metformin (Glucophage)  Metoprolol (Toprol)  Multivitamin  Ondansetron (Zofran)  Oxybutynin (Ditropan)  Pregabalin (Lyrica)  Rosuvastatin (Crestor)  Vitamin D2 ==================================================================== For clinical consultation, please call [phone]. ====================================================================      ROS  Constitutional: Denies any fever or chills Gastrointestinal: No reported hemesis, hematochezia, vomiting, or acute GI distress Musculoskeletal:  Neck pain with radiation into right shoulder Neurological: No reported episodes of acute onset apraxia, aphasia, dysarthria, agnosia, amnesia, paralysis, loss of coordination, or loss of consciousness  Medication Review  Budeson-Glycopyrrol-Formoterol, Dulaglutide, Microlet Lancets, Multiple Vitamins-Minerals, NON FORMULARY, OVER THE COUNTER MEDICATION, Vitamin D (Ergocalciferol), albuterol, amLODipine, busPIRone, chlorthalidone, desvenlafaxine, fluticasone, glucose blood, hydrochlorothiazide, ipratropium-albuterol, metFORMIN, metoprolol succinate, metoprolol tartrate, minoxidil, oxyCODONE, oxyCODONE-acetaminophen, pregabalin, and rosuvastatin  History Review  Allergy: [name] has No Known Allergies. Drug: [name]  reports no history of drug use. Alcohol:  reports no history of alcohol use. Tobacco:  reports that she has never smoked. She has never used smokeless tobacco. Social: [name]  reports that she has never smoked. She has never used smokeless tobacco. She reports that she does not drink alcohol and does not use drugs. Medical:  has a past medical history of Anginal pain ([place]), Anxiety, Cholecystolithiasis, Depression, Diabetes mellitus without  complication ([place]), GERD (gastroesophageal reflux disease), History of C-section (1992), Hypertension, and Sleep apnea. Surgical: Candace Clayton  has a past surgical history that includes Cesarean section (1992); Finger surgery (Left, 2011); Hematoma evacuation (Left, 1999); Colonoscopy; Laparoscopic salpingo oophorectomy (Left, 04/02/2018); Colonoscopy with propofol (N/A, 03/14/2019); and Cholecystectomy (10/26/2020). Family: family history includes Brain cancer in her mother; Breast cancer (age of onset: 51) in her mother; Cancer - Colon in her mother; Cancer - Other in her mother; Colon cancer in her brother and mother; Heart Problems in her brother and father.  Laboratory Chemistry Profile   Renal Lab Results  Component Value Date   BUN 11 05/24/2021   CREATININE 0.82 05/24/2021   BCR 13 05/24/2021   GFRAA 106 02/23/2020   GFRNONAA 92 02/23/2020    Hepatic Lab Results  Component Value Date   AST 18 11/18/2020   ALT 24 11/18/2020   ALBUMIN 4.1 11/18/2020   ALKPHOS 120 11/18/2020    Electrolytes Lab Results  Component Value Date   NA 140 05/24/2021   K 4.2 05/24/2021   CL 100 05/24/2021   CALCIUM 9.5 05/24/2021    Bone Lab Results  Component Value Date   VD25OH 12.6 (L) 02/23/2020    Inflammation (CRP: Acute Phase) (ESR: Chronic Phase) Lab Results  Component Value Date   CRP 2 05/24/2021   ESRSEDRATE 12 05/24/2021         Note: Above Lab results reviewed.  Recent Imaging Review  MR CERVICAL SPINE WO CONTRAST CLINICAL DATA:  Cervical radicular pain M54.12 (ICD-10-CM); Neck pain, chronic, degenerative changes on xray; Cervical radiculopathy, no red flags; Cervicalgia M54.2 (ICD-10-CM); chronic pain syndrome G89.4 (ICD-10-CM).  EXAM: MRI CERVICAL SPINE WITHOUT CONTRAST  TECHNIQUE: Multiplanar, multisequence MR imaging of the cervical spine was performed. No intravenous contrast was administered.  COMPARISON:  None.  FINDINGS: Alignment:  Physiologic.  Vertebrae: No fracture, evidence of discitis, or bone lesion. Congenitally small spinal canal  Cord: Normal signal and morphology.  Posterior Fossa, vertebral arteries, paraspinal tissues: Negative.  Disc levels:  C2-3: No spinal canal or neural foraminal stenosis.  C3-4: No spinal canal or neural foraminal stenosis.  C4-5: No spinal canal or neural foraminal stenosis.  C5-6: Loss of disc height, posterior disc osteophyte complex resulting in mild spinal canal stenosis. Uncovertebral and facet degenerative changes resulting in moderate left neural foraminal narrowing.  C6-7: Posterior disc osteophyte complex resulting in mild spinal canal stenosis. Uncovertebral and facet degenerative changes without significant neural foraminal narrowing.  C7-T1: No spinal canal or neural foraminal stenosis.  IMPRESSION: 1. Mild degenerative disc disease superimposed on a congenitally small spinal canal resulting in mild spinal canal stenosis at C5-6 and C6-7. 2. Moderate left neural foraminal narrowing at C5-6.  Electronically Signed   By: Erven Colla  de Sindy Messing M.D.   On: 07/12/2021 15:23 Note: Reviewed        Physical Exam  General appearance: Well nourished, well developed, and well hydrated. In no apparent acute distress Mental status: Alert, oriented x 3 (person, place, & time)       Respiratory: No evidence of acute respiratory distress Eyes: PERLA Vitals: BP (!) 142/73    Pulse 81    Temp (!) 97.2 F (36.2 C) (Temporal)    Resp 16    Ht _0  (1.753 m)    Wt (!) 347 lb (157.4 kg)    SpO2 100%    BMI 51.24 kg/m  BMI: Estimated body mass index is 51.24 kg/m as calculated from the following:   Height as of this encounter: _1  (1.753 m).   Weight as of this encounter: 347 lb (157.4 kg). Ideal: Ideal body weight: 66.2 kg (145 lb 15.1 oz) Adjusted ideal body weight: 102.7 kg (226 lb 5.9 oz)  Cervical Spine Area Exam  Skin & Axial Inspection: No masses,  redness, edema, swelling, or associated skin lesions Alignment: Symmetrical Functional ROM: Decreased ROM, to the right Stability: No instability detected Muscle Tone/Strength: Functionally intact. No obvious neuro-muscular anomalies detected. Sensory (Neurological): Dermatomal pain pattern, right C5-C6, improved from before Palpation: No palpable anomalies             Positive Spurling's on the right  Assessment   Status Diagnosis  Controlled Controlled Controlled 1. Cervical radicular pain (right C5/6)   2. Cervicalgia   3. Lumbar degenerative disc disease   4. Lumbar facet arthropathy    5. Lumbar spondylosis   6. Chronic pain syndrome       Plan of Care  Problem-specific:  No problem-specific Assessment & Plan notes found for this encounter.  Candace Clayton has a current medication list which includes the following long-term medication(s): albuterol, amlodipine, chlorthalidone, desvenlafaxine, fluticasone, hydrochlorothiazide, ipratropium-albuterol, metformin, metoprolol succinate, minoxidil, rosuvastatin, [START ON 08/03/2021] oxycodone-acetaminophen, [START ON 09/02/2021] oxycodone-acetaminophen, [START ON 10/02/2021] oxycodone-acetaminophen, pregabalin, and [DISCONTINUED] metoprolol tartrate.  Pharmacotherapy (Medications Ordered): Meds ordered this encounter  Medications   oxyCODONE-acetaminophen (PERCOCET/ROXICET) 5-325 MG tablet    Sig: Take 1 tablet by mouth every 8 (eight) hours as needed for severe pain.    Dispense:  90 tablet    Refill:  0    For chronic pain syndrome. To last for 30 days from fill date.   oxyCODONE-acetaminophen (PERCOCET/ROXICET) 5-325 MG tablet    Sig: Take 1 tablet by mouth every 8 (eight) hours as needed for severe pain.    Dispense:  90 tablet    Refill:  0    For chronic pain syndrome. To last for 30 days from fill date.   oxyCODONE-acetaminophen (PERCOCET/ROXICET) 5-325 MG tablet    Sig: Take 1 tablet by mouth every 8 (eight) hours as  needed for severe pain.    Dispense:  90 tablet    Refill:  0    For chronic pain syndrome. To last for 30 days from fill date.   pregabalin (LYRICA) 100 MG capsule    Sig: 100 mg qAM, 200 mg qhs    Dispense:  90 capsule    Refill:  5    Fill one day early if pharmacy is closed on scheduled refill date. May substitute for generic if available.   Orders:  Orders Placed This Encounter  Procedures   Cervical Epidural Injection    Sedation: Patient's choice. Purpose: Diagnostic/Therapeutic Indication(s): Radiculitis and  cervicalgia associater with cervical degenerative disc disease.    Standing Status:   Standing    Number of Occurrences:   2    Standing Expiration Date:   01/25/2022    Scheduling Instructions:     Procedure: Cervical Epidural Steroid Injection/Block     Level(s): C7-T1     Laterality: TBD     Timeframe: As soon as schedule allows    Order Specific Question:   Where will this procedure be performed?    Answer:   [facility]    Comments:   Nereida Schepp   Follow-up plan:   Return in about 3 months (around 10/25/2021) for Medication Management, in person.     Status post diagnostic bilateral L3, L4, L5, S1 facet medial branch nerve blocks, status post right L3, L4, L5, S1 RFA on 05/05/2019, 04/15/20; left L3, L4, L5 RFA 05/03/2020.         Recent Visits Date Type Provider Dept  06/30/21 Office Visit Gillis Santa, MD [facility]  06/13/21 Procedure visit Gillis Santa, MD [facility]  05/25/21 Office Visit Gillis Santa, MD [facility]  Showing recent visits within past 90 days and meeting all other requirements Today's Visits Date Type Provider Dept  07/28/21 Office Visit Gillis Santa, MD [facility]  Showing today's visits and meeting all other requirements Future Appointments Date Type Provider Dept  10/20/21 Appointment Gillis Santa, MD [facility]  Showing future appointments within next 90 days and meeting  all other requirements  I discussed the assessment and treatment plan with the patient. The patient was provided an opportunity to ask questions and all were answered. The patient agreed with the plan and demonstrated an understanding of the instructions.  Patient advised to call back or seek an in-person evaluation if the symptoms or condition worsens.  Duration of encounter: 85mnutes.  Note by: BGillis Santa MD Date: 07/28/2021; Time: 10:58 AM

## 2021-07-28 NOTE — Progress Notes (Signed)
Nursing Pain Medication Assessment:  Safety precautions to be maintained throughout the outpatient stay will include: orient to surroundings, keep bed in low position, maintain call bell within reach at all times, provide assistance with transfer out of bed and ambulation.  Medication Inspection Compliance: Pill count conducted under aseptic conditions, in front of the patient. Neither the pills nor the bottle was removed from the patient's sight at any time. Once count was completed pills were immediately returned to the patient in their original bottle.  Medication: Oxycodone/APAP Pill/Patch Count:  16 of 90 pills remain Pill/Patch Appearance: Markings consistent with prescribed medication Bottle Appearance: Standard pharmacy container. Clearly labeled. Filled Date: 70 / 09 / 2022 Last Medication intake:  Today

## 2021-08-10 ENCOUNTER — Telehealth: Payer: Self-pay

## 2021-08-10 NOTE — Telephone Encounter (Signed)
Verbal order for osa supplies signed by provider and placed in [facility] folder.

## 2021-08-12 ENCOUNTER — Ambulatory Visit: Payer: No Typology Code available for payment source | Admitting: Nurse Practitioner

## 2021-08-12 ENCOUNTER — Encounter: Payer: Self-pay | Admitting: Nurse Practitioner

## 2021-08-12 ENCOUNTER — Other Ambulatory Visit: Payer: Self-pay

## 2021-08-12 VITALS — BP 134/67 | HR 78 | Temp 98.6°F | Resp 16 | Ht 69.0 in | Wt 345.0 lb

## 2021-08-12 DIAGNOSIS — F331 Major depressive disorder, recurrent, moderate: Secondary | ICD-10-CM

## 2021-08-12 DIAGNOSIS — R1114 Bilious vomiting: Secondary | ICD-10-CM

## 2021-08-12 DIAGNOSIS — M5412 Radiculopathy, cervical region: Secondary | ICD-10-CM | POA: Diagnosis not present

## 2021-08-12 DIAGNOSIS — E1165 Type 2 diabetes mellitus with hyperglycemia: Secondary | ICD-10-CM

## 2021-08-12 DIAGNOSIS — I10 Essential (primary) hypertension: Secondary | ICD-10-CM

## 2021-08-12 DIAGNOSIS — J300 Vasomotor rhinitis: Secondary | ICD-10-CM

## 2021-08-12 MED ORDER — TRULICITY 3 MG/0.5ML ~~LOC~~ SOAJ: 3.0000 mg | SUBCUTANEOUS | 2 refills | Status: DC

## 2021-08-12 MED ORDER — DESVENLAFAXINE SUCCINATE ER 100 MG PO TB24: 100.0000 mg | ORAL_TABLET | Freq: Every day | ORAL | 1 refills | Status: DC

## 2021-08-12 MED ORDER — FLUTICASONE PROPIONATE 50 MCG/ACT NA SUSP: 2.0000 | Freq: Every day | NASAL | 6 refills | Status: DC

## 2021-08-12 NOTE — Progress Notes (Signed)
[facility] [place], [place] [address]  Internal MEDICINE  Office Visit Note  Patient Name: Candace Clayton  [id number]  [id number]  Date of Service: 08/12/2021  Chief Complaint  Patient presents with   Follow-up   Depression   Diabetes   Gastroesophageal Reflux   Hypertension   Anxiety   Medication Refill   Vomiting    everyday   Nausea    HPI [name] presents for a follow up visit for back and neck pain. She also reports she went back to work about 2 weeks ago and has been vomiting every day. She has been working 2 days on, 2 days off. She works a 12 hour work day. She is prescribed percocet 5-325 mg by Dr. Gillis Santa and she reports that she has been taking 1 percocet tablet every 2 hours during her 12 hour workday. The neck and back pain continues to get worse and she has even had to take a percocet when she wakes up in the morning because the pain is so bad. Dr. Holley Raring had an MRI of her cervical spine done and recommended a cervical epidural procedure to help alleviate the pain but she declined.  Discussed the cervical epidural procedure at length with the patient and how this could be beneficial to her.  Explained to patient that taking the percocet too often and contradictory of how Dr. Holley Raring prescribed it puts her at risk of overdose and increased tolerance and dependence on the medication. She will also run out of the medication faster and then have the potential to go through opiate withdrawal.  Her appetite is decreased and is has lost 2 lbs since her previous office visit. Her blood pressure is stable with her current medications.   Current Medication: Outpatient Encounter Medications as of 08/12/2021  Medication Sig   albuterol (VENTOLIN HFA) 108 (90 Base) MCG/ACT inhaler TAKE 2 PUFFS BY MOUTH EVERY 6 HOURS AS NEEDED FOR WHEEZE OR SHORTNESS OF BREATH   amLODipine (NORVASC) 10 MG tablet Take 1 tablet (10 mg total) by mouth daily.    Budeson-Glycopyrrol-Formoterol (BREZTRI AEROSPHERE) 160-9-4.8 MCG/ACT AERO Inhale 2 puffs into the lungs 2 (two) times daily.   busPIRone (BUSPAR) 15 MG tablet Take 1 tablet (15 mg total) by mouth 3 (three) times daily. Take on tablet TID for anxiety.   chlorthalidone (HYGROTON) 25 MG tablet Take 1 tablet (25 mg total) by mouth daily.   glucose blood (CONTOUR NEXT TEST) test strip 1 each by Other route daily. DX E11.65   hydrochlorothiazide (HYDRODIURIL) 25 MG tablet Take 1 tablet (25 mg total) by mouth daily.   ipratropium-albuterol (DUONEB) 0.5-2.5 (3) MG/3ML SOLN Take 3 mLs by nebulization every 4 (four) hours as needed (shortness of breath or wheezing).   metFORMIN (GLUCOPHAGE) 500 MG tablet Take 1 tablet (500 mg total) by mouth 2 (two) times daily with a meal.   metoprolol succinate (TOPROL-XL) 100 MG 24 hr tablet Take 1 tablet (100 mg total) by mouth daily. TAKE WITH OR IMMEDIATELY FOLLOWING A MEAL.   Microlet Lancets MISC 1 each by Does not apply route daily. DX-E11.65   minoxidil (LONITEN) 10 MG tablet Take 1 tablet (10 mg total) by mouth daily.   Multiple Vitamins-Calcium (ONE-A-DAY WOMENS PO) Take 1 tablet by mouth daily.   NON FORMULARY cpap device   OVER THE COUNTER MEDICATION 1 tablet daily as needed. Takes generic stool softener for constipation d/t oxycodone.    oxyCODONE (OXY IR/ROXICODONE) 5 MG immediate release  tablet Take by mouth.   oxyCODONE-acetaminophen (PERCOCET/ROXICET) 5-325 MG tablet Take 1 tablet by mouth every 8 (eight) hours as needed for severe pain.   [START ON 09/02/2021] oxyCODONE-acetaminophen (PERCOCET/ROXICET) 5-325 MG tablet Take 1 tablet by mouth every 8 (eight) hours as needed for severe pain.   [START ON 10/02/2021] oxyCODONE-acetaminophen (PERCOCET/ROXICET) 5-325 MG tablet Take 1 tablet by mouth every 8 (eight) hours as needed for severe pain.   pregabalin (LYRICA) 100 MG capsule 100 mg qAM, 200 mg qhs   rosuvastatin (CRESTOR) 5 MG tablet Take 1 tablet (5 mg  total) by mouth daily.   Vitamin D, Ergocalciferol, (DRISDOL) 1.25 MG (50000 UNIT) CAPS capsule TAKE 1 CAPSULE BY MOUTH ONE TIME PER WEEK   [DISCONTINUED] desvenlafaxine (PRISTIQ) 100 MG 24 hr tablet Take 1 tablet (100 mg total) by mouth daily.   [DISCONTINUED] Dulaglutide (TRULICITY) 3 HB/7.1IR SOPN Inject 3 mg as directed once a week.   [DISCONTINUED] fluticasone (FLONASE) 50 MCG/ACT nasal spray Place 2 sprays into both nostrils daily.   desvenlafaxine (PRISTIQ) 100 MG 24 hr tablet Take 1 tablet (100 mg total) by mouth daily.   Dulaglutide (TRULICITY) 3 CV/8.9FY SOPN Inject 3 mg as directed once a week.   fluticasone (FLONASE) 50 MCG/ACT nasal spray Place 2 sprays into both nostrils daily.   [DISCONTINUED] metoprolol tartrate (LOPRESSOR) 25 MG tablet Take 2 tablets (50 mg total) by mouth 2 (two) times daily.   No facility-administered encounter medications on file as of 08/12/2021.    Surgical History: Past Surgical History:  Procedure Laterality Date   CESAREAN SECTION  1992   CHOLECYSTECTOMY  10/26/2020   COLONOSCOPY     polyps removed first procedure. 2nd time all was clear   COLONOSCOPY WITH PROPOFOL N/A 03/14/2019   Procedure: COLONOSCOPY WITH PROPOFOL;  Surgeon: Lin Landsman, MD;  Location: [facility] ENDOSCOPY;  Service: Gastroenterology;  Laterality: N/A;   FINGER SURGERY Left 2011   left finger cut off x 2.(only up to last digit)   HEMATOMA EVACUATION Left 1999   upper part of foot was injured d/t 500lb weight landing on her foot.    LAPAROSCOPIC SALPINGO OOPHERECTOMY Left 04/02/2018   Procedure: LAPAROSCOPIC SALPINGO OOPHORECTOMY;  Surgeon: Malachy Mood, MD;  Location: [facility] ORS;  Service: Gynecology;  Laterality: Left;    Medical History: Past Medical History:  Diagnosis Date   Anginal pain (HCC)    tightness related to anxiety   Anxiety    Cholecystolithiasis    Depression    Diabetes mellitus without complication (HCC)    GERD (gastroesophageal reflux disease)     throws up easily but not diagnosed with reflux   History of C-section 1992   Hypertension    Sleep apnea    uses cpap    Family History: Family History  Problem Relation Age of Onset   Breast cancer Mother 56       x 3 times   Colon cancer Mother    Brain cancer Mother    Cancer - Colon Mother    Cancer - Other Mother    Heart Problems Father    Colon cancer Brother    Heart Problems Brother     Social History   Socioeconomic History   Marital status: Divorced    Spouse name: Not on file   Number of children: 1   Years of education: Not on file   Highest education level: Not on file  Occupational History   Occupation: works in [place] Use  Smoking status: Never   Smokeless tobacco: Never  Vaping Use   Vaping Use: Never used  Substance and Sexual Activity   Alcohol use: No   Drug use: No   Sexual activity: Not Currently    Birth control/protection: Post-menopausal  Other Topics Concern   Not on file  Social History Narrative   Son has schizophrenia and autism but is fully capable of helping mother after surgery   Social Determinants of Health   Financial Resource Strain: Not on file  Food Insecurity: Not on file  Transportation Needs: Not on file  Physical Activity: Not on file  Stress: Not on file  Social Connections: Not on file  Intimate Partner Violence: Not on file      Review of Systems  Constitutional:  Positive for fatigue. Negative for chills and unexpected weight change.  HENT:  Negative for congestion, rhinorrhea, sneezing and sore throat.   Eyes:  Negative for redness.  Respiratory: Negative.  Negative for cough, chest tightness, shortness of breath and wheezing.   Cardiovascular: Negative.  Negative for chest pain and palpitations.  Gastrointestinal:  Positive for nausea and vomiting. Negative for abdominal pain, constipation and diarrhea.  Genitourinary:  Negative for dysuria and frequency.  Musculoskeletal:   Positive for arthralgias and back pain. Negative for joint swelling and neck pain.  Skin: Negative.  Negative for rash.  Neurological: Negative.  Negative for tremors and numbness.  Hematological:  Negative for adenopathy. Does not bruise/bleed easily.  Psychiatric/Behavioral:  Negative for behavioral problems (Depression), self-injury, sleep disturbance and suicidal ideas. The patient is not nervous/anxious.    Vital Signs: BP 134/67    Pulse 78    Temp 98.6 F (37 C)    Resp 16    Ht 5' 9"  (1.753 m)    Wt (!) 345 lb (156.5 kg)    SpO2 97%    BMI 50.95 kg/m    Physical Exam Vitals reviewed.  Constitutional:      General: She is not in acute distress.    Appearance: Normal appearance. She is obese. She is not ill-appearing.  HENT:     Head: Normocephalic and atraumatic.  Eyes:     Pupils: Pupils are equal, round, and reactive to light.  Cardiovascular:     Rate and Rhythm: Normal rate and regular rhythm.  Pulmonary:     Effort: Pulmonary effort is normal. No respiratory distress.  Neurological:     Mental Status: She is alert and oriented to person, place, and time.     Cranial Nerves: No cranial nerve deficit.     Coordination: Coordination normal.     Gait: Gait normal.  Psychiatric:        Mood and Affect: Mood normal.        Behavior: Behavior normal.       Assessment/Plan: 1. Bilious vomiting with nausea Possible early sign of acetaminophen toxicity. Patient was instructed to take her percocet 1 tablet every 8 hours as prescribed by Dr. Holley Raring. Counseled patient on overuse of percocet and possible complications related to overdose, withdrawal, tolerance and dependence. Stat labs ordered and sent to [facility]. Patient is aware.  - Acetaminophen level - QMG50+IBBC - INR/PT - Salicylate level  2. Cervical radiculopathy Discussed the cervical epidural procedure as recommended by Dr. Holley Raring and the possible benefits the procedure would have for the patient. Her neck and  back pain is severe and is impacting her daily life and ability to work. This pain is also the reason the  patient has been misusing her percocet prescription. Patient is agreeable to getting cervical epidural procedure from Dr. Holley Raring. She will call to schedule the procedure.  3. Type 2 diabetes mellitus with hyperglycemia, without long-term current use of insulin (HCC) Last A1C is 6.9, con - Dulaglutide (TRULICITY) 3 ID/0.3UD SOPN; Inject 3 mg as directed once a week.  Dispense: 6 mL; Refill: 2  4. Essential hypertension Blood pressure is well controlled with current medications.  5. Vasomotor rhinitis Stable, continue as prescribed.  - fluticasone (FLONASE) 50 MCG/ACT nasal spray; Place 2 sprays into both nostrils daily.  Dispense: 16 g; Refill: 6  6. Major depressive disorder, recurrent episode, moderate (HCC) Stable, continue as prescribed.  - desvenlafaxine (PRISTIQ) 100 MG 24 hr tablet; Take 1 tablet (100 mg total) by mouth daily.  Dispense: 90 tablet; Refill: 1   General Counseling: Lakya verbalizes understanding of the findings of todays visit and agrees with plan of treatment. I have discussed any further diagnostic evaluation that may be needed or ordered today. We also reviewed her medications today. she has been encouraged to call the office with any questions or concerns that should arise related to todays visit.    Orders Placed This Encounter  Procedures   Acetaminophen level   THY38+OILN   INR/PT   Salicylate level    Meds ordered this encounter  Medications   Dulaglutide (TRULICITY) 3 ZV/7.2QA SOPN    Sig: Inject 3 mg as directed once a week.    Dispense:  6 mL    Refill:  2   fluticasone (FLONASE) 50 MCG/ACT nasal spray    Sig: Place 2 sprays into both nostrils daily.    Dispense:  16 g    Refill:  6   desvenlafaxine (PRISTIQ) 100 MG 24 hr tablet    Sig: Take 1 tablet (100 mg total) by mouth daily.    Dispense:  90 tablet    Refill:  1    Return in 24  days (on 09/05/2021) for previously scheduled, F/U, Recheck A1C, Jasani Lengel PCP.   Total time spent:30 Minutes Time spent includes review of chart, medications, test results, and follow up plan with the patient.   [place] Controlled Substance Database was reviewed by me.  This patient was seen by Jonetta Osgood, FNP-C in collaboration with Dr. Clayborn Bigness as a part of collaborative care agreement.   Markesha Hannig R. Valetta Fuller, MSN, FNP-C Internal medicine

## 2021-08-14 ENCOUNTER — Encounter: Payer: Self-pay | Admitting: Nurse Practitioner

## 2021-08-16 ENCOUNTER — Ambulatory Visit: Payer: No Typology Code available for payment source | Admitting: Nurse Practitioner

## 2021-08-18 LAB — CMP14+EGFR
ALT: 23 IU/L (ref 0–32)
AST: 29 IU/L (ref 0–40)
Albumin/Globulin Ratio: 2 (ref 1.2–2.2)
Albumin: 4.7 g/dL (ref 3.8–4.8)
Alkaline Phosphatase: 101 IU/L (ref 44–121)
BUN/Creatinine Ratio: 12 (ref 12–28)
BUN: 11 mg/dL (ref 8–27)
Bilirubin Total: 0.4 mg/dL (ref 0.0–1.2)
CO2: 24 mmol/L (ref 20–29)
Calcium: 9.9 mg/dL (ref 8.7–10.3)
Chloride: 102 mmol/L (ref 96–106)
Creatinine, Ser: 0.9 mg/dL (ref 0.57–1.00)
Globulin, Total: 2.4 g/dL (ref 1.5–4.5)
Glucose: 156 mg/dL — ABNORMAL HIGH (ref 70–99)
Potassium: 4.4 mmol/L (ref 3.5–5.2)
Sodium: 143 mmol/L (ref 134–144)
Total Protein: 7.1 g/dL (ref 6.0–8.5)
eGFR: 73 mL/min/{1.73_m2} (ref >59)

## 2021-08-18 LAB — PROTIME-INR
INR: 1 (ref 0.9–1.2)
Prothrombin Time: 10.1 s (ref 9.1–12.0)

## 2021-08-18 LAB — SALICYLATE LEVEL: Salicylate, Serum: NOT DETECTED ug/mL (ref 30–250)

## 2021-08-18 LAB — ACETAMINOPHEN LEVEL: Acetaminophen (Tylenol), S: NOT DETECTED ug/mL (ref 10–30)

## 2021-08-19 ENCOUNTER — Telehealth: Payer: Self-pay

## 2021-08-19 NOTE — Telephone Encounter (Signed)
-----   Message from Jonetta Osgood, NP sent at 08/19/2021  1:08 PM EST ----- Please call patient and let her know that her labs were normal and there was no elevated acetaminophen and or salicylate in her system.  Please emphasize the fact that taking more medication than what is prescribed by your provider, whether it is your PCP or a specialist, is dangerous. Taking more medicine than what is prescribed when it comes to medications like Percocet is even more dangerous because of the risk of having too much acetaminophen in your system which could hurt your liver or having too much oxycodone which can cause a lot of other problems especially trouble breathing. Also asked patient how her neck pain is and if she has called Dr. Elwyn Lade office and scheduled her cervical epidural procedure

## 2021-08-19 NOTE — Telephone Encounter (Signed)
Spoke to pt, provided results, informed her of the dangers of not taking meds correctly, pt has appt on Monday with Dr. Elwyn Lade office, and neck pain is the same.

## 2021-08-19 NOTE — Progress Notes (Signed)
Please call patient and let her know that her labs were normal and there was no elevated acetaminophen and or salicylate in her system.  Please emphasize the fact that taking more medication than what is prescribed by your provider, whether it is your PCP or a specialist, is dangerous. Taking more medicine than what is prescribed when it comes to medications like Percocet is even more dangerous because of the risk of having too much acetaminophen in your system which could hurt your liver or having too much oxycodone which can cause a lot of other problems especially trouble breathing. Also asked patient how her neck pain is and if she has called Dr. Elwyn Lade office and scheduled her cervical epidural procedure

## 2021-08-22 ENCOUNTER — Other Ambulatory Visit: Payer: Self-pay

## 2021-08-22 ENCOUNTER — Ambulatory Visit (HOSPITAL_BASED_OUTPATIENT_CLINIC_OR_DEPARTMENT_OTHER)
Payer: No Typology Code available for payment source | Admitting: Student in an Organized Health Care Education/Training Program

## 2021-08-22 ENCOUNTER — Encounter: Payer: Self-pay | Admitting: Student in an Organized Health Care Education/Training Program

## 2021-08-22 ENCOUNTER — Ambulatory Visit
Admission: RE | Admit: 2021-08-22 | Discharge: 2021-08-22 | Disposition: A | Discharge status: Home / Self Care | Payer: No Typology Code available for payment source | Source: Ambulatory Visit | Attending: Student in an Organized Health Care Education/Training Program | Admitting: Student in an Organized Health Care Education/Training Program

## 2021-08-22 VITALS — BP 144/84 | HR 86 | Temp 97.2°F | Resp 20 | Ht 69.0 in | Wt 340.0 lb

## 2021-08-22 DIAGNOSIS — M5412 Radiculopathy, cervical region: Secondary | ICD-10-CM | POA: Insufficient documentation

## 2021-08-22 DIAGNOSIS — G894 Chronic pain syndrome: Secondary | ICD-10-CM | POA: Diagnosis not present

## 2021-08-22 MED ORDER — SODIUM CHLORIDE (PF) 0.9 % IJ SOLN
INTRAMUSCULAR | Status: AC
  Filled 2021-08-22: qty 10

## 2021-08-22 MED ORDER — ROPIVACAINE HCL 2 MG/ML IJ SOLN
INTRAMUSCULAR | Status: AC
  Filled 2021-08-22: qty 20

## 2021-08-22 MED ORDER — LIDOCAINE HCL 2 % IJ SOLN
20.0000 mL | Freq: Once | INTRAMUSCULAR | Status: AC
  Administered 2021-08-22: 400 mg

## 2021-08-22 MED ORDER — IOHEXOL 180 MG/ML  SOLN
10.0000 mL | Freq: Once | INTRAMUSCULAR | Status: AC
  Administered 2021-08-22: 10 mL via EPIDURAL

## 2021-08-22 MED ORDER — SODIUM CHLORIDE 0.9% FLUSH
1.0000 mL | Freq: Once | INTRAVENOUS | Status: AC
  Administered 2021-08-22: 10 mL

## 2021-08-22 MED ORDER — DEXAMETHASONE SODIUM PHOSPHATE 10 MG/ML IJ SOLN
INTRAMUSCULAR | Status: AC
  Filled 2021-08-22: qty 1

## 2021-08-22 MED ORDER — LIDOCAINE HCL 2 % IJ SOLN
INTRAMUSCULAR | Status: AC
  Filled 2021-08-22: qty 20

## 2021-08-22 MED ORDER — IOHEXOL 180 MG/ML  SOLN
INTRAMUSCULAR | Status: AC
  Filled 2021-08-22: qty 20

## 2021-08-22 MED ORDER — DEXAMETHASONE SODIUM PHOSPHATE 10 MG/ML IJ SOLN
10.0000 mg | Freq: Once | INTRAMUSCULAR | Status: AC
  Administered 2021-08-22: 10 mg

## 2021-08-22 MED ORDER — ROPIVACAINE HCL 2 MG/ML IJ SOLN
1.0000 mL | Freq: Once | INTRAMUSCULAR | Status: AC
  Administered 2021-08-22: 20 mL via EPIDURAL

## 2021-08-22 NOTE — Patient Instructions (Signed)

## 2021-08-22 NOTE — Progress Notes (Signed)
PROVIDER NOTE: Interpretation of information contained herein should be left to medically-trained personnel. Specific patient instructions are provided elsewhere under "Patient Instructions" section of medical record. This document was created in part using STT-dictation technology, any transcriptional errors that may result from this process are unintentional.  Patient: Candace Clayton Type: Established DOB: 1960/07/17 MRN: [id number] PCP: Lavera Guise, MD  Service: Procedure DOS: 08/22/2021 Setting: Ambulatory Location: Ambulatory outpatient facility Delivery: Face-to-face Provider: Gillis Santa, MD Specialty: Interventional Pain Management Specialty designation: 09 Location: Outpatient facility Ref. Prov.: Gillis Santa, MD   Procedure [facility] Interventional Pain Management )   Type: Cervical Epidural Steroid injection (ESI) (Interlaminar) #1  Laterality: Left  Level: T1-2 Imaging: Fluoroscopy-assisted DOS: 08/22/2021  Performed by: Gillis Santa, MD Anesthesia: Local anesthesia (1-2% Lidocaine) Anxiolysis: None                 Sedation: None.   Purpose: Diagnostic/Therapeutic Indications: Cervicalgia, cervical radicular pain, degenerative disc disease, severe enough to impact quality of life or function. 1. Chronic pain syndrome   2. Cervical radicular pain (right C5/6)    NAS-11 score:   Pre-procedure: 4 /10   Post-procedure: 0-No pain/10     Pre-Procedure Preparation  Monitoring: As per clinic protocol. Respiration, ETCO2, SpO2, BP, heart rate and rhythm monitor placed and checked for adequate function  Risk Assessment: Vitals:  [id number] body mass index is 50.21 kg/m as calculated from the following:   Height as of this encounter: 5\' 9"  (1.753 m).   Weight as of this encounter: 340 lb (154.2 kg)., Rate:86ECG Heart Rate: 84, BP:(!) 143/73, Resp:18, Temp:(!) 97.2 F (36.2 C), SpO2:98 %  Allergies: She has No Known Allergies.  Precautions: None required   Blood-thinner(s): None at this time  Coagulopathies: Reviewed. None identified.   Active Infection(s): Reviewed. None identified. Candace Clayton is afebrile   Location setting: Procedure suite Position: Prone, on modified reverse trendelenburg to facilitate breathing, with head in head-cradle. Pillows positioned under chest (below chin-level) with cervical spine flexed. Safety Precautions: Patient was assessed for positional comfort and pressure points before starting the procedure. Prepping solution: DuraPrep (Iodine Povacrylex [0.7% available iodine] and Isopropyl Alcohol, 74% w/w) Prep Area: Entire  cervicothoracic region Approach: percutaneous, paramedial Intended target: Posterior cervical epidural space Materials: Tray: Epidural Needle(s): Epidural (Tuohy) Qty: 1 Length: (8mm) 3.5-inch Gauge: 22G   Meds ordered this encounter  Medications   iohexol (OMNIPAQUE) 180 MG/ML injection 10 mL    Must be Myelogram-compatible. If not available, you may substitute with a water-soluble, non-ionic, hypoallergenic, myelogram-compatible radiological contrast medium.   lidocaine (XYLOCAINE) 2 % (with pres) injection 400 mg   ropivacaine (PF) 2 mg/mL (0.2%) (NAROPIN) injection 1 mL   sodium chloride flush (NS) 0.9 % injection 1 mL   dexamethasone (DECADRON) injection 10 mg    Orders Placed This Encounter  Procedures   DG PAIN CLINIC C-ARM 1-60 MIN NO REPORT    Intraoperative interpretation by procedural physician at [place].    Standing Status:   Standing    Number of Occurrences:   1    Order Specific Question:   Reason for exam:    Answer:   Assistance in needle guidance and placement for procedures requiring needle placement in or near specific anatomical locations not easily accessible without such assistance.     Time-out: 1005 I initiated and conducted the "Time-out" before starting the procedure, as per protocol. The patient was asked to participate by confirming the  accuracy of the "Time Out"  information. Verification of the correct person, site, and procedure were performed and confirmed by me, the nursing staff, and the patient. "Time-out" conducted as per Joint Commission's Universal Protocol (UP.01.01.01). Procedure checklist: Completed   H&P (Pre-op  Assessment)  Candace Clayton is a 61 y.o. (year old), female patient, seen today for interventional treatment. She  has a past surgical history that includes Cesarean section (1992); Finger surgery (Left, 2011); Hematoma evacuation (Left, 1999); Colonoscopy; Laparoscopic salpingo oophorectomy (Left, 04/02/2018); Colonoscopy with propofol (N/A, 03/14/2019); and Cholecystectomy (10/26/2020). Candace Clayton has a current medication list which includes the following prescription(s): albuterol, amlodipine, breztri aerosphere, buspirone, chlorthalidone, desvenlafaxine, trulicity, fluticasone, contour next test, hydrochlorothiazide, ipratropium-albuterol, metformin, metoprolol succinate, microlet lancets, minoxidil, multiple vitamins-minerals, NON FORMULARY, OVER THE COUNTER MEDICATION, oxycodone-acetaminophen, [START ON 09/02/2021] oxycodone-acetaminophen, [START ON 10/02/2021] oxycodone-acetaminophen, pregabalin, rosuvastatin, vitamin d (ergocalciferol), oxycodone, and [DISCONTINUED] metoprolol tartrate. Her primarily concern today is the Neck Pain  She has No Known Allergies.   Last encounter: My last encounter with her was on 07/28/2021. Pertinent problems: Candace Clayton has Major depressive disorder, recurrent episode, moderate ([place]); Morbid obesity ([place]); Low back pain with sciatica; OSA on CPAP; Lumbar degenerative disc disease; Lumbar facet arthropathy (R>L); Lumbar spondylosis; Chronic pain syndrome; and Type 2 diabetes mellitus with hyperglycemia, without long-term current use of insulin (HCC) on their pertinent problem list. Pain Assessment: Severity of Chronic pain is reported as a 4 /10. Location: Neck Right/right arm and right  breast. Onset: More than a month ago. Quality: Sharp. Timing: Constant. Modifying factor(s): lying down, rest. Vitals:  height is 5\' 9"  (1.753 m) and weight is 340 lb (154.2 kg) (abnormal). Her temporal temperature is 97.2 F (36.2 C) (abnormal). Her blood pressure is 144/84 (abnormal) and her pulse is 86. Her respiration is 20 and oxygen saturation is 96%.   Reason for encounter: Interventional pain management therapy due pain of at least four (4) weeks in duration, with to failure to respond to and/or inability to tolerate more conservative care.   Site Confirmation: Candace Clayton was asked to confirm the procedure and laterality before marking the site.  Consent: Before the procedure and under the influence of no sedative(s), amnesic(s), or anxiolytics, the patient was informed of the treatment options, risks and possible complications. To fulfill our ethical and legal obligations, as recommended by the American Medical Association's Code of Ethics, I have informed the patient of my clinical impression; the nature and purpose of the treatment or procedure; the risks, benefits, and possible complications of the intervention; the alternatives, including doing nothing; the risk(s) and benefit(s) of the alternative treatment(s) or procedure(s); and the risk(s) and benefit(s) of doing nothing. The patient was provided information about the general risks and possible complications associated with the procedure. These may include, but are not limited to: failure to achieve desired goals, infection, bleeding, organ or nerve damage, allergic reactions, paralysis, and death. In addition, the patient was informed of those risks and complications associated to Spine-related procedures, such as failure to decrease pain; infection (i.e.: Meningitis, epidural or intraspinal abscess); bleeding (i.e.: epidural hematoma, subarachnoid hemorrhage, or any other type of intraspinal or peri-dural bleeding); organ or nerve  damage (i.e.: Any type of peripheral nerve, nerve root, or spinal cord injury) with subsequent damage to sensory, motor, and/or autonomic systems, resulting in permanent pain, numbness, and/or weakness of one or several areas of the body; allergic reactions; (i.e.: anaphylactic reaction); and/or death. Furthermore, the patient was informed of those risks and complications associated with the medications. These include,  but are not limited to: allergic reactions (i.e.: anaphylactic or anaphylactoid reaction(s)); adrenal axis suppression; blood sugar elevation that in diabetics may result in ketoacidosis or comma; water retention that in patients with history of congestive heart failure may result in shortness of breath, pulmonary edema, and decompensation with resultant heart failure; weight gain; swelling or edema; medication-induced neural toxicity; particulate matter embolism and blood vessel occlusion with resultant organ, and/or nervous system infarction; and/or aseptic necrosis of one or more joints. Finally, the patient was informed that Medicine is not an exact science; therefore, there is also the possibility of unforeseen or unpredictable risks and/or possible complications that may result in a catastrophic outcome. The patient indicated having understood very clearly. We have given the patient no guarantees and we have made no promises. Enough time was given to the patient to ask questions, all of which were answered to the patient's satisfaction. Candace Clayton has indicated that she wanted to continue with the procedure. Attestation: I, the ordering provider, attest that I have discussed with the patient the benefits, risks, side-effects, alternatives, likelihood of achieving goals, and potential problems during recovery for the procedure that I have provided informed consent.  Date   Time: 08/22/2021  9:34 AM    Description of procedure   Start Time: 1005 hrs  Local Anesthesia: Once the patient  was positioned, prepped, and time-out was completed. The target area was identified located. The skin was marked with an approved surgical skin marker. Once marked, the skin (epidermis, dermis, and hypodermis), and deeper tissues (fat, connective tissue and muscle) were infiltrated with a small amount of a short-acting local anesthetic, loaded on a 10cc syringe with a 25G, 1.5-in  Needle. An appropriate amount of time was allowed for local anesthetics to take effect before proceeding to the next step. Local Anesthetic: Lidocaine 1-2% The unused portion of the local anesthetic was discarded in the proper designated containers. Safety Precautions: Aspiration looking for blood return was conducted prior to all injections. At no point did I inject any substances, as a needle was being advanced. Before injecting, the patient was told to immediately notify me if she was experiencing any new onset of "ringing in the ears, or metallic taste in the mouth". No attempts were made at seeking any paresthesias. Safe injection practices and needle disposal techniques used. Medications properly checked for expiration dates. SDV (single dose vial) medications used. After the completion of the procedure, all disposable equipment used was discarded in the proper designated medical waste containers.  Technical description: Protocol guidelines were followed. Using fluoroscopic guidance, the epidural needle was introduced through the skin, ipsilateral to the reported pain, and advanced to the target area. Posterior laminar os was contacted and the needle walked caudad, until the lamina was cleared. The ligamentum flavum was engaged and the epidural space identified using loss-of-resistance technique with 2-3 ml of PF-NaCl (0.9% NSS), in a 5cc dedicated LOR syringe. See "Imaging guidance" below for use of contrast details.  Injection: Once satisfactory needle placement was confirmed, I proceeded to inject the desired solution in  slow, incremental fashion, intermittently assessing for discomfort or any signs of abnormal or undesired spread of substance. Once completed, the needle was removed and disposed of, as per hospital protocols.   Vitals:   08/22/21 0940 08/22/21 1006 08/22/21 1010 08/22/21 1015  BP: (!) 143/73 (!) 143/83 (!) 147/87 (!) 144/84  Pulse: 86     Resp: 18 20 20 20   Temp: (!) 97.2 F (36.2 C)  TempSrc: Temporal     SpO2: 98% 96% 97% 96%  Weight: (!) 340 lb (154.2 kg)     Height: 5\' 9"  (1.753 m)       End Time: 1014 hrs  Once the entire procedure was completed, the treated area was cleaned, making sure to leave some of the prepping solution back to take advantage of its long term bactericidal properties.   Imaging guidance  Type of Imaging Technique: Fluoroscopy Guidance (Spinal) Indication(s): Assistance in needle guidance and placement for procedures requiring needle placement in or near specific anatomical locations not easily accessible without such assistance. Exposure Time: Please see nurses notes for exact fluoroscopy time. Contrast: Before injecting any contrast, we confirmed that the patient did not have an allergy to iodine, shellfish, or radiological contrast. Once satisfactory needle placement was completed, radiological contrast was injected under continuous fluoroscopic guidance. Injection of contrast accomplished without complications. See chart for type and volume of contrast used. Fluoroscopic Guidance: I was personally present in the fluoroscopy suite, where the patient was placed in position for the procedure, over the fluoroscopy-compatible table. Fluoroscopy was manipulated, using "Tunnel Vision Technique", to obtain the best possible view of the target area, on the affected side. Parallax error was corrected before commencing the procedure. A "direction-depth-direction" technique was used to introduce the needle under continuous pulsed fluoroscopic guidance. Once the target was  reached, antero-posterior, oblique, and lateral fluoroscopic projection views were taken to confirm needle placement in all planes. Electronic images uploaded into EMR.  Interpretation: Successful epidural injection. Intraoperative imaging interpretation by performing Physician.    Post-op assessment  Post-procedure Vital Signs:  Pulse/HCG Rate: 8683 Temp: (!) 97.2 F (36.2 C) Resp: 20 BP: (!) 144/84 SpO2: 96 %  EBL: None  Complications: No immediate post-treatment complications observed by team, or reported by patient.  Note: The patient tolerated the entire procedure well. A repeat set of vitals were taken after the procedure and the patient was kept under observation following institutional policy, for this type of procedure. Post-procedural neurological assessment was performed, showing return to baseline, prior to discharge. The patient was provided with post-procedure discharge instructions, including a section on how to identify potential problems. Should any problems arise concerning this procedure, the patient was given instructions to immediately contact us, at any time, without hesitation. In any case, we plan to contact the patient by telephone for a follow-up status report regarding this interventional procedure.  Comments:  No additional relevant information.   Plan of care  Chronic Opioid Analgesic:  Percocet 5 mg 3 times daily as needed, quantity 90/month; MME equals 22.5    Medications administered: We administered iohexol, lidocaine, ropivacaine (PF) 2 mg/mL (0.2%), sodium chloride flush, and dexamethasone.  Follow-up plan:   Return in about 2 weeks (around 09/05/2021) for Post Procedure Evaluation.      Status post diagnostic bilateral L3, L4, L5, S1 facet medial branch nerve blocks, status post right L3, L4, L5, S1 RFA on 05/05/2019, 04/15/20; left L3, L4, L5 RFA 05/03/2020.          Recent Visits Date Type Provider Dept  07/28/21 Office Visit Gillis Santa, MD  [facility]  06/30/21 Office Visit Gillis Santa, MD [facility]  06/13/21 Procedure visit Gillis Santa, MD [facility]  05/25/21 Office Visit Gillis Santa, MD [facility]  Showing recent visits within past 90 days and meeting all other requirements Today's Visits Date Type Provider Dept  08/22/21 Procedure visit Gillis Santa, MD [facility]  Showing today's visits and meeting all other requirements Future Appointments Date Type Provider Dept  09/07/21 Appointment Gillis Santa, MD [facility]  10/18/21 Appointment Gillis Santa, MD [facility]  Showing future appointments within next 90 days and meeting all other requirements   Disposition: Discharge home  Discharge (Date   Time): 08/22/2021; 1020 hrs.   Primary Care Physician: Lavera Guise, MD Location: [facility] Note by: Gillis Santa, MD Date: 08/22/2021; Time: 11:56 AM  DISCLAIMER: Medicine is not an exact science. It has no guarantees or warranties. The decision to proceed with this intervention was based on the information collected from the patient. Conclusions were drawn from the patient's questionnaire, interview, and examination. Because information was provided in large part by the patient, it cannot be guaranteed that it has not been purposely or unconsciously manipulated or altered. Every effort has been made to obtain as much accurate, relevant, available data as possible. Always take into account that the treatment will also be dependent on availability of resources and existing treatment guidelines, considered by other Pain Management Specialists as being common knowledge and practice, at the time of the intervention. It is also important to point out that variation in procedural techniques and pharmacological choices are the acceptable norm. For Medico-Legal review purposes, the indications, contraindications, technique, and  results of the these procedures should only be evaluated, judged and interpreted by a Board-Certified Interventional Pain Specialist with extensive familiarity and expertise in the same exact procedure and technique.

## 2021-08-22 NOTE — Progress Notes (Signed)
Safety precautions to be maintained throughout the outpatient stay will include: orient to surroundings, keep bed in low position, maintain call bell within reach at all times, provide assistance with transfer out of bed and ambulation.  

## 2021-08-23 ENCOUNTER — Telehealth: Payer: Self-pay | Admitting: *Deleted

## 2021-08-23 NOTE — Telephone Encounter (Signed)
No problems post procedure. 

## 2021-08-26 ENCOUNTER — Other Ambulatory Visit: Payer: Self-pay

## 2021-08-26 ENCOUNTER — Ambulatory Visit
Admission: RE | Admit: 2021-08-26 | Discharge: 2021-08-26 | Disposition: A | Discharge status: Home / Self Care | Payer: No Typology Code available for payment source | Source: Ambulatory Visit | Attending: Nurse Practitioner | Admitting: Nurse Practitioner

## 2021-08-26 DIAGNOSIS — Z1231 Encounter for screening mammogram for malignant neoplasm of breast: Secondary | ICD-10-CM

## 2021-09-05 ENCOUNTER — Ambulatory Visit (INDEPENDENT_AMBULATORY_CARE_PROVIDER_SITE_OTHER): Payer: No Typology Code available for payment source | Admitting: Nurse Practitioner

## 2021-09-05 ENCOUNTER — Other Ambulatory Visit: Payer: Self-pay

## 2021-09-05 ENCOUNTER — Encounter: Payer: Self-pay | Admitting: Nurse Practitioner

## 2021-09-05 VITALS — BP 140/70 | HR 90 | Temp 98.8°F | Resp 16 | Ht 69.0 in | Wt 349.2 lb

## 2021-09-05 DIAGNOSIS — I10 Essential (primary) hypertension: Secondary | ICD-10-CM | POA: Diagnosis not present

## 2021-09-05 DIAGNOSIS — E1165 Type 2 diabetes mellitus with hyperglycemia: Secondary | ICD-10-CM | POA: Diagnosis not present

## 2021-09-05 DIAGNOSIS — M5412 Radiculopathy, cervical region: Secondary | ICD-10-CM

## 2021-09-05 DIAGNOSIS — R1114 Bilious vomiting: Secondary | ICD-10-CM

## 2021-09-05 LAB — POCT GLYCOSYLATED HEMOGLOBIN (HGB A1C): Hemoglobin A1C: 7.7 % — AB (ref 4.0–5.6)

## 2021-09-05 NOTE — Progress Notes (Unsigned)
[facility] [place], [place] [address]  Internal MEDICINE  Office Visit Note  Patient Name: Candace Clayton  [id number]  [id number]  Date of Service: 09/05/2021  Chief Complaint  Patient presents with   Follow-up    Paperwork needs to be done today or pt will be terminated from her job   Depression   Diabetes   Gastroesophageal Reflux   Anxiety   Hypertension    HPI [name] presents for a follow-up visit for  Steroid epidural    Current Medication: Outpatient Encounter Medications as of 09/05/2021  Medication Sig   albuterol (VENTOLIN HFA) 108 (90 Base) MCG/ACT inhaler TAKE 2 PUFFS BY MOUTH EVERY 6 HOURS AS NEEDED FOR WHEEZE OR SHORTNESS OF BREATH   amLODipine (NORVASC) 10 MG tablet Take 1 tablet (10 mg total) by mouth daily.   Budeson-Glycopyrrol-Formoterol (BREZTRI AEROSPHERE) 160-9-4.8 MCG/ACT AERO Inhale 2 puffs into the lungs 2 (two) times daily.   busPIRone (BUSPAR) 15 MG tablet Take 1 tablet (15 mg total) by mouth 3 (three) times daily. Take on tablet TID for anxiety.   chlorthalidone (HYGROTON) 25 MG tablet Take 1 tablet (25 mg total) by mouth daily.   desvenlafaxine (PRISTIQ) 100 MG 24 hr tablet Take 1 tablet (100 mg total) by mouth daily.   Dulaglutide (TRULICITY) 3 KC/0.0LK SOPN Inject 3 mg as directed once a week.   fluticasone (FLONASE) 50 MCG/ACT nasal spray Place 2 sprays into both nostrils daily.   glucose blood (CONTOUR NEXT TEST) test strip 1 each by Other route daily. DX E11.65   hydrochlorothiazide (HYDRODIURIL) 25 MG tablet Take 1 tablet (25 mg total) by mouth daily.   ipratropium-albuterol (DUONEB) 0.5-2.5 (3) MG/3ML SOLN Take 3 mLs by nebulization every 4 (four) hours as needed (shortness of breath or wheezing).   metFORMIN (GLUCOPHAGE) 500 MG tablet Take 1 tablet (500 mg total) by mouth 2 (two) times daily with a meal.   metoprolol succinate (TOPROL-XL) 100 MG 24 hr tablet Take 1 tablet (100 mg total) by mouth daily. TAKE WITH  OR IMMEDIATELY FOLLOWING A MEAL.   Microlet Lancets MISC 1 each by Does not apply route daily. DX-E11.65   minoxidil (LONITEN) 10 MG tablet Take 1 tablet (10 mg total) by mouth daily.   Multiple Vitamins-Calcium (ONE-A-DAY WOMENS PO) Take 1 tablet by mouth daily.   NON FORMULARY cpap device   OVER THE COUNTER MEDICATION 1 tablet daily as needed. Takes generic stool softener for constipation d/t oxycodone.    oxyCODONE (OXY IR/ROXICODONE) 5 MG immediate release tablet Take by mouth.   oxyCODONE-acetaminophen (PERCOCET/ROXICET) 5-325 MG tablet Take 1 tablet by mouth every 8 (eight) hours as needed for severe pain.   [START ON 10/02/2021] oxyCODONE-acetaminophen (PERCOCET/ROXICET) 5-325 MG tablet Take 1 tablet by mouth every 8 (eight) hours as needed for severe pain.   pregabalin (LYRICA) 100 MG capsule 100 mg qAM, 200 mg qhs   rosuvastatin (CRESTOR) 5 MG tablet Take 1 tablet (5 mg total) by mouth daily.   Vitamin D, Ergocalciferol, (DRISDOL) 1.25 MG (50000 UNIT) CAPS capsule TAKE 1 CAPSULE BY MOUTH ONE TIME PER WEEK   oxyCODONE-acetaminophen (PERCOCET/ROXICET) 5-325 MG tablet Take 1 tablet by mouth every 8 (eight) hours as needed for severe pain.   [DISCONTINUED] metoprolol tartrate (LOPRESSOR) 25 MG tablet Take 2 tablets (50 mg total) by mouth 2 (two) times daily.   No facility-administered encounter medications on file as of 09/05/2021.    Surgical History: Past Surgical History:  Procedure Laterality Date  CESAREAN SECTION  1992   CHOLECYSTECTOMY  10/26/2020   COLONOSCOPY     polyps removed first procedure. 2nd time all was clear   COLONOSCOPY WITH PROPOFOL N/A 03/14/2019   Procedure: COLONOSCOPY WITH PROPOFOL;  Surgeon: Lin Landsman, MD;  Location: [facility] ENDOSCOPY;  Service: Gastroenterology;  Laterality: N/A;   FINGER SURGERY Left 2011   left finger cut off x 2.(only up to last digit)   HEMATOMA EVACUATION Left 1999   upper part of foot was injured d/t 500lb weight landing on her  foot.    LAPAROSCOPIC SALPINGO OOPHERECTOMY Left 04/02/2018   Procedure: LAPAROSCOPIC SALPINGO OOPHORECTOMY;  Surgeon: Malachy Mood, MD;  Location: [facility] ORS;  Service: Gynecology;  Laterality: Left;    Medical History: Past Medical History:  Diagnosis Date   Anginal pain (HCC)    tightness related to anxiety   Anxiety    Cholecystolithiasis    Depression    Diabetes mellitus without complication (HCC)    GERD (gastroesophageal reflux disease)    throws up easily but not diagnosed with reflux   History of C-section 1992   Hypertension    Sleep apnea    uses cpap    Family History: Family History  Problem Relation Age of Onset   Breast cancer Mother 28       x 3 times   Colon cancer Mother    Brain cancer Mother    Cancer - Colon Mother    Cancer - Other Mother    Heart Problems Father    Colon cancer Brother    Heart Problems Brother     Social History   Socioeconomic History   Marital status: Divorced    Spouse name: Not on file   Number of children: 1   Years of education: Not on file   Highest education level: Not on file  Occupational History   Occupation: works in Secretary/administrator  Tobacco Use   Smoking status: Never   Smokeless tobacco: Never  Vaping Use   Vaping Use: Never used  Substance and Sexual Activity   Alcohol use: No   Drug use: No   Sexual activity: Not Currently    Birth control/protection: Post-menopausal  Other Topics Concern   Not on file  Social History Narrative   Son has schizophrenia and autism but is fully capable of helping mother after surgery   Social Determinants of Health   Financial Resource Strain: Not on file  Food Insecurity: Not on file  Transportation Needs: Not on file  Physical Activity: Not on file  Stress: Not on file  Social Connections: Not on file  Intimate Partner Violence: Not on file      Review of Systems  Vital Signs: BP (!) 146/70    Pulse 90    Temp 98.8 F (37.1 C)    Resp 16    Ht 5'  9" (1.753 m)    Wt (!) 349 lb 3.2 oz (158.4 kg)    SpO2 98%    BMI 51.57 kg/m    Physical Exam     Assessment/Plan:   General Counseling: Jaycelyn verbalizes understanding of the findings of todays visit and agrees with plan of treatment. I have discussed any further diagnostic evaluation that may be needed or ordered today. We also reviewed her medications today. she has been encouraged to call the office with any questions or concerns that should arise related to todays visit.    Orders Placed This Encounter  Procedures   POCT  HgB A1C    No orders of the defined types were placed in this encounter.   No follow-ups on file.   Total time spent:*** Minutes Time spent includes review of chart, medications, test results, and follow up plan with the patient.   [place] Controlled Substance Database was reviewed by me.  This patient was seen by Jonetta Osgood, FNP-C in collaboration with Dr. Clayborn Bigness as a part of collaborative care agreement.   [place] R. Valetta Fuller, MSN, FNP-C Internal medicine

## 2021-09-06 ENCOUNTER — Encounter: Payer: Self-pay | Admitting: Nurse Practitioner

## 2021-09-07 ENCOUNTER — Other Ambulatory Visit: Payer: Self-pay

## 2021-09-07 ENCOUNTER — Ambulatory Visit
Payer: No Typology Code available for payment source | Attending: Student in an Organized Health Care Education/Training Program | Admitting: Student in an Organized Health Care Education/Training Program

## 2021-09-07 ENCOUNTER — Encounter: Payer: Self-pay | Admitting: Student in an Organized Health Care Education/Training Program

## 2021-09-07 VITALS — BP 156/80 | HR 99 | Temp 96.9°F | Resp 18 | Ht 69.0 in | Wt 349.0 lb

## 2021-09-07 DIAGNOSIS — M542 Cervicalgia: Secondary | ICD-10-CM

## 2021-09-07 DIAGNOSIS — G894 Chronic pain syndrome: Secondary | ICD-10-CM | POA: Diagnosis not present

## 2021-09-07 DIAGNOSIS — M5412 Radiculopathy, cervical region: Secondary | ICD-10-CM | POA: Diagnosis not present

## 2021-09-07 NOTE — Progress Notes (Signed)
PROVIDER NOTE: Information contained herein reflects review and annotations entered in association with encounter. Interpretation of such information and data should be left to medically-trained personnel. Information provided to patient can be located elsewhere in the medical record under "Patient Instructions". Document created using STT-dictation technology, any transcriptional errors that may result from process are unintentional.  ?  ?Patient: Candace Clayton  Service Category: E/M  Provider: Gillis Santa, MD  ?DOB: 05/24/1961  DOS: 09/07/2021  Specialty: Interventional Pain Management  ?MRN: [id number]  Setting: Ambulatory outpatient  PCP: Jonetta Osgood, NP  ?Type: Established Patient    Referring Provider: Lavera Guise, MD  ?Location: Office  Delivery: Face-to-face    ? ?HPI  ?[name], a 61 y.o. year old female, is here today because of her Cervical radicular pain [M54.12]. [name] primary complain today is Knee Pain (right) and Back Pain (lower) ?Last encounter: My last encounter with her was on 08/22/2021. ?Pertinent problems: [name] has Major depressive disorder, recurrent episode, moderate ([place]); Morbid obesity ([place]); Low back pain with sciatica; OSA on CPAP; Lumbar degenerative disc disease; Lumbar facet arthropathy (R>L); Lumbar spondylosis; Chronic pain syndrome; and Type 2 diabetes mellitus with hyperglycemia, without long-term current use of insulin ([place]) on their pertinent problem list. ?Pain Assessment: Severity of Chronic pain is reported as a 6 /10. Location: Knee Right/ . Onset: More than a month ago. Quality: Aching, Dull. Timing: Constant. Modifying factor(s): rest. ?Vitals:  height is _0  (1.753 m) and weight is 349 lb (158.3 kg) (abnormal). Her temperature is 96.9 ?F (36.1 ?C) (abnormal). Her blood pressure is 156/80 (abnormal) and her pulse is 99. Her respiration is 18 and oxygen saturation is 99%.  ? ?Reason for encounter: post-procedure evaluation and assessment.    ? ? ?Post-procedure evaluation  ?  Type: Cervical Epidural Steroid injection (ESI) (Interlaminar) #1  ?Laterality: Left  ?Level: T1-2 ?Imaging: Fluoroscopy-assisted ?DOS: 08/22/2021  ?Performed by: Gillis Santa, MD ?Anesthesia: Local anesthesia (1-2% Lidocaine) ?Anxiolysis: None                 ?Sedation: None.  ? ?Purpose: Diagnostic/Therapeutic ?Indications: Cervicalgia, cervical radicular pain, degenerative disc disease, severe enough to impact quality of life or function. ?1. Chronic pain syndrome   ?2. Cervical radicular pain (right C5/6)   ? ?NAS-11 score:  ? Pre-procedure: 4 /10  ? Post-procedure: 0-No pain/10  ?   ?Effectiveness:  ?Initial hour after procedure: 100 %  ?Subsequent 4-6 hours post-procedure: 100 %  ?Analgesia past initial 6 hours: 80 % (ongoing)  ?Ongoing improvement:  ?Analgesic:  80-90% ?Function: [name] reports improvement in function ?ROM: [name] reports improvement in ROM ? ? ? ?ROS  ?Constitutional: Denies any fever or chills ?Gastrointestinal: No reported hemesis, hematochezia, vomiting, or acute GI distress ?Musculoskeletal:  Improvement in neck pain with radiation into right arm.  Now having right knee pain with edema. ?Neurological: No reported episodes of acute onset apraxia, aphasia, dysarthria, agnosia, amnesia, paralysis, loss of coordination, or loss of consciousness ? ?Medication Review  ?Budeson-Glycopyrrol-Formoterol, Dulaglutide, Microlet Lancets, Multiple Vitamins-Minerals, NON FORMULARY, OVER THE COUNTER MEDICATION, Vitamin D (Ergocalciferol), albuterol, amLODipine, busPIRone, chlorthalidone, desvenlafaxine, fluticasone, glucose blood, hydrochlorothiazide, ipratropium-albuterol, metFORMIN, metoprolol succinate, metoprolol tartrate, minoxidil, oxyCODONE, oxyCODONE-acetaminophen, pregabalin, and rosuvastatin ? ?History Review  ?Allergy: [name] has No Known Allergies. ?Drug: [name]  reports no history of drug use. ?Alcohol:  reports no history of alcohol  use. ?Tobacco:  reports that she has never smoked. She has never used smokeless  tobacco. ?Social: [name]  reports that she has never smoked. She has never used smokeless tobacco. She reports that she does not drink alcohol and does not use drugs. ?Medical:  has a past medical history of Anginal pain ([place]), Anxiety, Cholecystolithiasis, Depression, Diabetes mellitus without complication ([place]), GERD (gastroesophageal reflux disease), History of C-section (1992), Hypertension, and Sleep apnea. ?Surgical: [name]  has a past surgical history that includes Cesarean section (1992); Finger surgery (Left, 2011); Hematoma evacuation (Left, 1999); Colonoscopy; Laparoscopic salpingo oophorectomy (Left, 04/02/2018); Colonoscopy with propofol (N/A, 03/14/2019); and Cholecystectomy (10/26/2020). ?Family: family history includes Brain cancer in her mother; Breast cancer (age of onset: 55) in her mother; Cancer - Colon in her mother; Cancer - Other in her mother; Colon cancer in her brother and mother; Heart Problems in her brother and father. ? ?Laboratory Chemistry Profile  ? ?Renal ?Lab Results  ?Component Value Date  ? BUN 11 08/16/2021  ? CREATININE 0.90 08/16/2021  ? BCR 12 08/16/2021  ? GFRAA 106 02/23/2020  ? GFRNONAA 92 02/23/2020  ?  Hepatic ?Lab Results  ?Component Value Date  ? AST 29 08/16/2021  ? ALT 23 08/16/2021  ? ALBUMIN 4.7 08/16/2021  ? ALKPHOS 101 08/16/2021  ?  ?Electrolytes ?Lab Results  ?Component Value Date  ? NA 143 08/16/2021  ? K 4.4 08/16/2021  ? CL 102 08/16/2021  ? CALCIUM 9.9 08/16/2021  ?  Bone ?Lab Results  ?Component Value Date  ? VD25OH 12.6 (L) 02/23/2020  ?  ?Inflammation (CRP: Acute Phase) (ESR: Chronic Phase) ?Lab Results  ?Component Value Date  ? CRP 2 05/24/2021  ? ESRSEDRATE 12 05/24/2021  ?    ?  ? ?Note: Above Lab results reviewed. ? ?Recent Imaging Review  ?MM 3D SCREEN BREAST BILATERAL ?CLINICAL DATA:  Screening. ? ?EXAM: ?DIGITAL SCREENING BILATERAL MAMMOGRAM WITH TOMOSYNTHESIS  AND CAD ? ?TECHNIQUE: ?Bilateral screening digital craniocaudal and mediolateral oblique ?mammograms were obtained. Bilateral screening digital breast ?tomosynthesis was performed. The images were evaluated with ?computer-aided detection. ? ?COMPARISON:  Previous exam(s). ? ?ACR Breast Density Category b: There are scattered areas of ?fibroglandular density. ? ?FINDINGS: ?There are no findings suspicious for malignancy. ? ?IMPRESSION: ?No mammographic evidence of malignancy. A result letter of this ?screening mammogram will be mailed directly to the patient. ? ?RECOMMENDATION: ?Screening mammogram in one year. (Code:SM-B-01Y) ? ?BI-RADS CATEGORY  1: Negative. ? ?Electronically Signed ?  By: Valentino Saxon M.D. ?  On: 08/26/2021 14:53 ?Note: Reviewed       ? ?Physical Exam  ?General appearance: Well nourished, well developed, and well hydrated. In no apparent acute distress ?Mental status: Alert, oriented x 3 (person, place, & time)       ?Respiratory: No evidence of acute respiratory distress ?Eyes: PERLA ?Vitals: BP (!) 156/80   Pulse 99   Temp (!) 96.9 ?F (36.1 ?C)   Resp 18   Ht _0  (1.753 m)   Wt (!) 349 lb (158.3 kg)   SpO2 99%   BMI 51.54 kg/m?  ?BMI: Estimated body mass index is 51.54 kg/m? as calculated from the following: ?  Height as of this encounter: _1  (1.753 m). ?  Weight as of this encounter: 349 lb (158.3 kg). ?Ideal: Ideal body weight: 66.2 kg (145 lb 15.1 oz) ?Adjusted ideal body weight: 103 kg (227 lb 2.7 oz) ? ?Cervical Spine Area Exam  ?Skin & Axial Inspection: No masses, redness, edema, swelling, or associated skin lesions ?Alignment: Symmetrical ?Functional ROM: Improved after treatment      ?  Stability: No instability detected ?Muscle Tone/Strength: Functionally intact. No obvious neuro-muscular anomalies detected. ?Sensory (Neurological): Improved ?Palpation: No palpable anomalies             ?Upper Extremity (UE) Exam    ?Side: Right upper extremity  Side: Left upper  extremity  ?Skin & Extremity Inspection: Skin color, temperature, and hair growth are WNL. No peripheral edema or cyanosis. No masses, redness, swelling, asymmetry, or associated skin lesions. No contractures.  Ski

## 2021-09-07 NOTE — Progress Notes (Signed)
Nursing Pain Medication Assessment:  ?Safety precautions to be maintained throughout the outpatient stay will include: orient to surroundings, keep bed in low position, maintain call bell within reach at all times, provide assistance with transfer out of bed and ambulation.  ?Medication Inspection Compliance: Pill count conducted under aseptic conditions, in front of the patient. Neither the pills nor the bottle was removed from the patient's sight at any time. Once count was completed pills were immediately returned to the patient in their original bottle. ? ?Medication: Oxycodone/APAP ?Pill/Patch Count:  75 of 90 pills remain ?Pill/Patch Appearance: Markings consistent with prescribed medication ?Bottle Appearance: Standard pharmacy container. Clearly labeled. ?Filled Date: 02 / 08 / 2023 ?Last Medication intake:   3 days ago ?

## 2021-10-05 ENCOUNTER — Other Ambulatory Visit: Payer: Self-pay | Admitting: Nurse Practitioner

## 2021-10-05 DIAGNOSIS — E559 Vitamin D deficiency, unspecified: Secondary | ICD-10-CM

## 2021-10-06 NOTE — Telephone Encounter (Signed)
Med sent to pharmacy.

## 2021-10-08 ENCOUNTER — Other Ambulatory Visit: Payer: Self-pay | Admitting: Nurse Practitioner

## 2021-10-08 DIAGNOSIS — R600 Localized edema: Secondary | ICD-10-CM

## 2021-10-18 ENCOUNTER — Ambulatory Visit
Payer: No Typology Code available for payment source | Attending: Student in an Organized Health Care Education/Training Program | Admitting: Student in an Organized Health Care Education/Training Program

## 2021-10-18 ENCOUNTER — Encounter: Payer: Self-pay | Admitting: Student in an Organized Health Care Education/Training Program

## 2021-10-18 VITALS — BP 151/69 | HR 70 | Temp 98.8°F | Ht 69.0 in | Wt 349.0 lb

## 2021-10-18 DIAGNOSIS — M5412 Radiculopathy, cervical region: Secondary | ICD-10-CM

## 2021-10-18 DIAGNOSIS — G894 Chronic pain syndrome: Secondary | ICD-10-CM

## 2021-10-18 DIAGNOSIS — J989 Respiratory disorder, unspecified: Secondary | ICD-10-CM

## 2021-10-18 DIAGNOSIS — M542 Cervicalgia: Secondary | ICD-10-CM | POA: Diagnosis present

## 2021-10-18 DIAGNOSIS — M1711 Unilateral primary osteoarthritis, right knee: Secondary | ICD-10-CM

## 2021-10-18 DIAGNOSIS — M5136 Other intervertebral disc degeneration, lumbar region: Secondary | ICD-10-CM | POA: Diagnosis present

## 2021-10-18 DIAGNOSIS — M47816 Spondylosis without myelopathy or radiculopathy, lumbar region: Secondary | ICD-10-CM

## 2021-10-18 MED ORDER — OXYCODONE-ACETAMINOPHEN 5-325 MG PO TABS: 1.0000 | ORAL_TABLET | Freq: Three times a day (TID) | ORAL | 0 refills | Status: DC | PRN

## 2021-10-18 NOTE — Progress Notes (Addendum)
PROVIDER NOTE: Information contained herein reflects review and annotations entered in association with encounter. Interpretation of such information and data should be left to medically-trained personnel. Information provided to patient can be located elsewhere in the medical record under "Patient Instructions". Document created using STT-dictation technology, any transcriptional errors that may result from process are unintentional.  ?  ?Patient: Candace Clayton  Service Category: E/M  Provider: Gillis Santa, MD  ?DOB: [birth year]  DOS: 10/18/2021  Specialty: Interventional Pain Management  ?MRN: [id number]  Setting: Ambulatory outpatient  PCP: Jonetta Osgood, NP  ?Type: Established Patient    Referring Provider: Lavera Guise, MD  ?Location: Office  Delivery: Face-to-face    ? ?HPI  ?Candace Clayton, a 61 y.o. year old female, is here today because of her Cervical radicular pain [M54.12]. Candace Clayton primary complain today is Back Pain ? ?Last encounter: My last encounter with her was on 09/07/21 ?Pertinent problems: Candace Clayton has Major depressive disorder, recurrent episode, moderate ([place]); Morbid obesity ([place]); Low back pain with sciatica; OSA on CPAP; Lumbar degenerative disc disease; Lumbar facet arthropathy (R>L); Lumbar spondylosis; Chronic pain syndrome; and Type 2 diabetes mellitus with hyperglycemia, without long-term current use of insulin ([place]) on their pertinent problem list. ?Pain Assessment: Severity of Chronic pain is reported as a 7 /10. Location: Back Right/pain radiaties down her right side. Onset: More than a month ago. Quality: Aching, Dull. Timing: Constant. Modifying factor(s): rest and meds. ?Vitals:  height is '5\' 9"'$  (1.753 m) and weight is 349 lb (158.3 kg) (abnormal). Her temperature is 98.8 ?F (37.1 ?C). Her blood pressure is 151/69 (abnormal) and her pulse is 70. Her oxygen saturation is 100%.  ? ?Reason for encounter: medication management.   ? ?No change in medical history since  last visit.  Patient's pain is at baseline.  Patient continues multimodal pain regimen as prescribed.  States that it provides pain relief and improvement in functional status. ?Patient states that she is less than 12 months before she can take retirement and she is very excited about this. ? ? ? ?Pharmacotherapy Assessment  ?Analgesic: Percocet 5 mg 3 times daily as needed, quantity 90/month; MME equals 22.5   ? ?Monitoring: ?[place] PMP: PDMP reviewed during this encounter.       ?Pharmacotherapy: No side-effects or adverse reactions reported. ?Compliance: No problems identified. ?Effectiveness: Clinically acceptable. ? ?Chauncey Fischer, RN  10/18/2021  1:01 PM  Sign when Signing Visit ?Nursing Pain Medication Assessment:  ?Safety precautions to be maintained throughout the outpatient stay will include: orient to surroundings, keep bed in low position, maintain call bell within reach at all times, provide assistance with transfer out of bed and ambulation.  ?Medication Inspection Compliance: Pill count conducted under aseptic conditions, in front of the patient. Neither the pills nor the bottle was removed from the patient's sight at any time. Once count was completed pills were immediately returned to the patient in their original bottle. ? ?Medication: Oxycodone/APAP ?Pill/Patch Count:  66 of 90 pills remain ?Pill/Patch Appearance: Markings consistent with prescribed medication ?Bottle Appearance: Standard pharmacy container. Clearly labeled. ?Filled Date: 2 / 8 / 2023 ?Last Medication intake:  TodaySafety precautions to be maintained throughout the outpatient stay will include: orient to surroundings, keep bed in low position, maintain call bell within reach at all times, provide assistance with transfer out of bed and ambulation.  ?  ? ?  UDS:  ?Summary  ?Date Value Ref Range Status  ?12/28/2020 Note  Final  ?  Comment:  ?  ==================================================================== ?[employer] Select 13  (MW) ?==================================================================== ?Test                             Result       Flag       Units ? ?Drug Present and Declared for Prescription Verification ?  Oxycodone                      1252         EXPECTED   ng/mg creat ?  Oxymorphone                    748          EXPECTED   ng/mg creat ?  Noroxycodone                   1206         EXPECTED   ng/mg creat ?  Noroxymorphone                 232          EXPECTED   ng/mg creat ?   Sources of oxycodone are scheduled prescription medications. ?   Oxymorphone, noroxycodone, and noroxymorphone are expected ?   metabolites of oxycodone. Oxymorphone is also available as a ?   scheduled prescription medication. ? ?==================================================================== ?Test                      Result    Flag   Units      Ref Range ?  Creatinine              126              mg/dL      >=20 ?==================================================================== ?Declared Medications: ? The flagging and interpretation on this report are based on the ? following declared medications.  Unexpected results may arise from ? inaccuracies in the declared medications. ? ? **Note: The testing scope of this panel includes these medications: ? ? Oxycodone (Roxicodone) ? Oxycodone (Percocet) ? ? **Note: The testing scope of this panel does not include the ? following reported medications: ? ? Acetaminophen (Percocet) ? Albuterol (Ventolin HFA) ? Amlodipine (Norvasc) ? Budesonide (Breztri Aerosphere) ? Buspirone (Buspar) ? Cetirizine (Zyrtec) ? Desvenlafaxine (Pristiq) ? Docusate (Stool Softener) ? Dulaglutide ? Enalapril (Vasotec) ? Fluticasone (Flonase) ? Formoterol (Breztri Aerosphere) ? Glycopyrrolate (Breztri Aerosphere) ? Hydrochlorothiazide (Hydrodiuril) ? Hydroxyzine (Atarax) ? Melatonin ? Metformin (Glucophage) ? Metoprolol (Toprol) ? Multivitamin ? Ondansetron (Zofran) ? Oxybutynin (Ditropan) ? Pregabalin (Lyrica) ?  Rosuvastatin (Crestor) ? Vitamin D2 ?==================================================================== ?For clinical consultation, please call [phone]. ?==================================================================== ?  ?  ? ?ROS  ?Constitutional: Denies any fever or chills ?Gastrointestinal: No reported hemesis, hematochezia, vomiting, or acute GI distress ?Musculoskeletal:  Left low back pain, right and left knee pain  ?Neurological: No reported episodes of acute onset apraxia, aphasia, dysarthria, agnosia, amnesia, paralysis, loss of coordination, or loss of consciousness ? ?Medication Review  ?Budeson-Glycopyrrol-Formoterol, Dulaglutide, Microlet Lancets, Multiple Vitamins-Minerals, NON FORMULARY, OVER THE COUNTER MEDICATION, Vitamin D (Ergocalciferol), albuterol, amLODipine, busPIRone, chlorthalidone, desvenlafaxine, fluticasone, glucose blood, hydrochlorothiazide, ipratropium-albuterol, metFORMIN, metoprolol succinate, metoprolol tartrate, minoxidil, oxyCODONE, oxyCODONE-acetaminophen, pregabalin, and rosuvastatin ? ?History Review  ?Allergy: Ms. Sweeten has No Known Allergies. ?Drug: Ms. Tenenbaum  reports no history of drug use. ?Alcohol:  reports no history of alcohol use. ?Tobacco:  reports that  she has never smoked. She has never used smokeless tobacco. ?Social: Ms. Starry  reports that she has never smoked. She has never used smokeless tobacco. She reports that she does not drink alcohol and does not use drugs. ?Medical:  has a past medical history of Anginal pain ([place]), Anxiety, Cholecystolithiasis, Depression, Diabetes mellitus without complication ([place]), GERD (gastroesophageal reflux disease), History of C-section (1992), Hypertension, and Sleep apnea. ?Surgical: Ms. Tellez  has a past surgical history that includes Cesarean section (1992); Finger surgery (Left, 2011); Hematoma evacuation (Left, 1999); Colonoscopy; Laparoscopic salpingo oophorectomy (Left, 04/02/2018); Colonoscopy with  propofol (N/A, 03/14/2019); and Cholecystectomy (10/26/2020). ?Family: family history includes Brain cancer in her mother; Breast cancer (age of onset: 67) in her mother; Cancer - Colon in her mother; Cancer

## 2021-10-18 NOTE — Progress Notes (Signed)
Nursing Pain Medication Assessment:  ?Safety precautions to be maintained throughout the outpatient stay will include: orient to surroundings, keep bed in low position, maintain call bell within reach at all times, provide assistance with transfer out of bed and ambulation.  ?Medication Inspection Compliance: Pill count conducted under aseptic conditions, in front of the patient. Neither the pills nor the bottle was removed from the patient's sight at any time. Once count was completed pills were immediately returned to the patient in their original bottle. ? ?Medication: Oxycodone/APAP ?Pill/Patch Count:  66 of 90 pills remain ?Pill/Patch Appearance: Markings consistent with prescribed medication ?Bottle Appearance: Standard pharmacy container. Clearly labeled. ?Filled Date: 2 / 8 / 2023 ?Last Medication intake:  TodaySafety precautions to be maintained throughout the outpatient stay will include: orient to surroundings, keep bed in low position, maintain call bell within reach at all times, provide assistance with transfer out of bed and ambulation.  ?

## 2021-10-20 ENCOUNTER — Encounter
Payer: No Typology Code available for payment source | Admitting: Student in an Organized Health Care Education/Training Program

## 2021-10-21 ENCOUNTER — Other Ambulatory Visit: Payer: Self-pay | Admitting: Nurse Practitioner

## 2021-10-21 DIAGNOSIS — F411 Generalized anxiety disorder: Secondary | ICD-10-CM

## 2021-11-22 ENCOUNTER — Telehealth: Payer: Self-pay

## 2021-11-22 NOTE — Telephone Encounter (Signed)
Left vm to confirm 11/28/21 appointment-Toni

## 2021-11-27 ENCOUNTER — Other Ambulatory Visit: Payer: Self-pay | Admitting: Nurse Practitioner

## 2021-11-27 DIAGNOSIS — E1165 Type 2 diabetes mellitus with hyperglycemia: Secondary | ICD-10-CM

## 2021-11-28 ENCOUNTER — Ambulatory Visit (INDEPENDENT_AMBULATORY_CARE_PROVIDER_SITE_OTHER): Payer: No Typology Code available for payment source | Admitting: Nurse Practitioner

## 2021-11-28 ENCOUNTER — Encounter: Payer: Self-pay | Admitting: Nurse Practitioner

## 2021-11-28 VITALS — BP 143/70 | HR 78 | Temp 98.7°F | Resp 16 | Ht 69.0 in | Wt 338.0 lb

## 2021-11-28 DIAGNOSIS — R600 Localized edema: Secondary | ICD-10-CM | POA: Diagnosis not present

## 2021-11-28 DIAGNOSIS — Z0001 Encounter for general adult medical examination with abnormal findings: Secondary | ICD-10-CM

## 2021-11-28 DIAGNOSIS — J449 Chronic obstructive pulmonary disease, unspecified: Secondary | ICD-10-CM

## 2021-11-28 DIAGNOSIS — R918 Other nonspecific abnormal finding of lung field: Secondary | ICD-10-CM

## 2021-11-28 DIAGNOSIS — J4530 Mild persistent asthma, uncomplicated: Secondary | ICD-10-CM | POA: Diagnosis not present

## 2021-11-28 DIAGNOSIS — M5412 Radiculopathy, cervical region: Secondary | ICD-10-CM

## 2021-11-28 DIAGNOSIS — E1165 Type 2 diabetes mellitus with hyperglycemia: Secondary | ICD-10-CM

## 2021-11-28 DIAGNOSIS — R300 Dysuria: Secondary | ICD-10-CM

## 2021-11-28 DIAGNOSIS — E538 Deficiency of other specified B group vitamins: Secondary | ICD-10-CM

## 2021-11-28 DIAGNOSIS — M7541 Impingement syndrome of right shoulder: Secondary | ICD-10-CM

## 2021-11-28 DIAGNOSIS — E559 Vitamin D deficiency, unspecified: Secondary | ICD-10-CM

## 2021-11-28 DIAGNOSIS — I10 Essential (primary) hypertension: Secondary | ICD-10-CM | POA: Diagnosis not present

## 2021-11-28 DIAGNOSIS — E782 Mixed hyperlipidemia: Secondary | ICD-10-CM

## 2021-11-28 DIAGNOSIS — L659 Nonscarring hair loss, unspecified: Secondary | ICD-10-CM

## 2021-11-28 DIAGNOSIS — J300 Vasomotor rhinitis: Secondary | ICD-10-CM

## 2021-11-28 DIAGNOSIS — F331 Major depressive disorder, recurrent, moderate: Secondary | ICD-10-CM

## 2021-11-28 LAB — POCT GLYCOSYLATED HEMOGLOBIN (HGB A1C): Hemoglobin A1C: 7.4 % — AB (ref 4.0–5.6)

## 2021-11-28 MED ORDER — ALBUTEROL SULFATE HFA 108 (90 BASE) MCG/ACT IN AERS: INHALATION_SPRAY | RESPIRATORY_TRACT | 3 refills | Status: DC

## 2021-11-28 MED ORDER — MINOXIDIL 10 MG PO TABS: 10.0000 mg | ORAL_TABLET | Freq: Every day | ORAL | 2 refills | Status: DC

## 2021-11-28 MED ORDER — DESVENLAFAXINE SUCCINATE ER 100 MG PO TB24: 100.0000 mg | ORAL_TABLET | Freq: Every day | ORAL | 1 refills | Status: DC

## 2021-11-28 MED ORDER — HYDROCHLOROTHIAZIDE 25 MG PO TABS: 25.0000 mg | ORAL_TABLET | Freq: Every day | ORAL | 3 refills | Status: DC

## 2021-11-28 MED ORDER — METOPROLOL SUCCINATE ER 100 MG PO TB24: 100.0000 mg | ORAL_TABLET | Freq: Every day | ORAL | 1 refills | Status: DC

## 2021-11-28 MED ORDER — CHLORTHALIDONE 25 MG PO TABS: 25.0000 mg | ORAL_TABLET | Freq: Every day | ORAL | 2 refills | Status: DC

## 2021-11-28 MED ORDER — TRULICITY 3 MG/0.5ML ~~LOC~~ SOAJ: 3.0000 mg | SUBCUTANEOUS | 2 refills | Status: DC

## 2021-11-28 MED ORDER — AMLODIPINE BESYLATE 10 MG PO TABS: 10.0000 mg | ORAL_TABLET | Freq: Every day | ORAL | 1 refills | Status: DC

## 2021-11-28 MED ORDER — ROSUVASTATIN CALCIUM 5 MG PO TABS: 5.0000 mg | ORAL_TABLET | Freq: Every day | ORAL | 1 refills | Status: DC

## 2021-11-28 MED ORDER — BREZTRI AEROSPHERE 160-9-4.8 MCG/ACT IN AERO: 2.0000 | INHALATION_SPRAY | Freq: Two times a day (BID) | RESPIRATORY_TRACT | 11 refills | Status: DC

## 2021-11-28 MED ORDER — FLUTICASONE PROPIONATE 50 MCG/ACT NA SUSP: 2.0000 | Freq: Every day | NASAL | 6 refills | Status: DC

## 2021-11-28 NOTE — Progress Notes (Signed)
[facility] [place], [place] [address]  Internal MEDICINE  Office Visit Note  Patient Name: Candace Clayton  [id number]  [id number]  Date of Service: 11/28/2021  Chief Complaint  Patient presents with   Annual Exam   Depression   Anxiety   Diabetes   Gastroesophageal Reflux   Hypertension    HPI Candace Clayton presents for an annual well visit and physical exam.  She is a well-appearing 61 year old female with hypertension, obstructive sleep apnea, type 2 diabetes, low back pain with sciatica, cervical radiculopathy and lumbar spondylosis.  She sees Dr. Holley Raring for pain management and has most recently had a cervical epidural treatment. She reports that she is feeling much better since the epidural treatment.  Her A1C is 7.4 today which is improved from 7.7 in January this year.  She has lost 11 lbs since April.  She also had an injury to her right shoulder at work but is doing much better now.  She is due for a pap smear in 2024. Hey eye exam is due in July this year, and routine colonoscopy is due in 2025. She had a routine screening mammogram in march this year.    Current Medication: Outpatient Encounter Medications as of 11/28/2021  Medication Sig   busPIRone (BUSPAR) 15 MG tablet TAKE 1 TABLET (15 MG TOTAL) BY MOUTH 3 (THREE) TIMES DAILY FOR ANXIETY.   glucose blood (CONTOUR NEXT TEST) test strip 1 each by Other route daily. DX E11.65   ipratropium-albuterol (DUONEB) 0.5-2.5 (3) MG/3ML SOLN Take 3 mLs by nebulization every 4 (four) hours as needed (shortness of breath or wheezing).   metFORMIN (GLUCOPHAGE) 500 MG tablet TAKE 1 TABLET BY MOUTH 2 TIMES DAILY WITH A MEAL.   Microlet Lancets MISC 1 each by Does not apply route daily. DX-E11.65   Multiple Vitamins-Calcium (ONE-A-DAY WOMENS PO) Take 1 tablet by mouth daily.   NON FORMULARY cpap device   OVER THE COUNTER MEDICATION 1 tablet daily as needed. Takes generic stool softener for constipation d/t oxycodone.     oxyCODONE (OXY IR/ROXICODONE) 5 MG immediate release tablet Take by mouth.   oxyCODONE-acetaminophen (PERCOCET/ROXICET) 5-325 MG tablet Take 1 tablet by mouth every 8 (eight) hours as needed for severe pain.   [START ON 12/01/2021] oxyCODONE-acetaminophen (PERCOCET/ROXICET) 5-325 MG tablet Take 1 tablet by mouth every 8 (eight) hours as needed for severe pain.   [START ON 12/31/2021] oxyCODONE-acetaminophen (PERCOCET/ROXICET) 5-325 MG tablet Take 1 tablet by mouth every 8 (eight) hours as needed for severe pain.   pregabalin (LYRICA) 100 MG capsule 100 mg qAM, 200 mg qhs   Vitamin D, Ergocalciferol, (DRISDOL) 1.25 MG (50000 UNIT) CAPS capsule TAKE 1 CAPSULE BY MOUTH ONE TIME PER WEEK   [DISCONTINUED] albuterol (VENTOLIN HFA) 108 (90 Base) MCG/ACT inhaler TAKE 2 PUFFS BY MOUTH EVERY 6 HOURS AS NEEDED FOR WHEEZE OR SHORTNESS OF BREATH   [DISCONTINUED] amLODipine (NORVASC) 10 MG tablet Take 1 tablet (10 mg total) by mouth daily.   [DISCONTINUED] Budeson-Glycopyrrol-Formoterol (BREZTRI AEROSPHERE) 160-9-4.8 MCG/ACT AERO Inhale 2 puffs into the lungs 2 (two) times daily.   [DISCONTINUED] chlorthalidone (HYGROTON) 25 MG tablet TAKE 1 TABLET (25 MG TOTAL) BY MOUTH DAILY.   [DISCONTINUED] desvenlafaxine (PRISTIQ) 100 MG 24 hr tablet Take 1 tablet (100 mg total) by mouth daily.   [DISCONTINUED] Dulaglutide (TRULICITY) 3 SW/5.4OE SOPN Inject 3 mg as directed once a week.   [DISCONTINUED] fluticasone (FLONASE) 50 MCG/ACT nasal spray Place 2 sprays into both nostrils daily.   [  DISCONTINUED] hydrochlorothiazide (HYDRODIURIL) 25 MG tablet Take 1 tablet (25 mg total) by mouth daily.   [DISCONTINUED] metoprolol succinate (TOPROL-XL) 100 MG 24 hr tablet Take 1 tablet (100 mg total) by mouth daily. TAKE WITH OR IMMEDIATELY FOLLOWING A MEAL.   [DISCONTINUED] minoxidil (LONITEN) 10 MG tablet Take 1 tablet (10 mg total) by mouth daily.   [DISCONTINUED] rosuvastatin (CRESTOR) 5 MG tablet Take 1 tablet (5 mg total) by mouth  daily.   albuterol (VENTOLIN HFA) 108 (90 Base) MCG/ACT inhaler TAKE 2 PUFFS BY MOUTH EVERY 6 HOURS AS NEEDED FOR WHEEZE OR SHORTNESS OF BREATH   amLODipine (NORVASC) 10 MG tablet Take 1 tablet (10 mg total) by mouth daily.   Budeson-Glycopyrrol-Formoterol (BREZTRI AEROSPHERE) 160-9-4.8 MCG/ACT AERO Inhale 2 puffs into the lungs 2 (two) times daily.   chlorthalidone (HYGROTON) 25 MG tablet Take 1 tablet (25 mg total) by mouth daily.   desvenlafaxine (PRISTIQ) 100 MG 24 hr tablet Take 1 tablet (100 mg total) by mouth daily.   Dulaglutide (TRULICITY) 3 LD/3.5TS SOPN Inject 3 mg as directed once a week.   fluticasone (FLONASE) 50 MCG/ACT nasal spray Place 2 sprays into both nostrils daily.   hydrochlorothiazide (HYDRODIURIL) 25 MG tablet Take 1 tablet (25 mg total) by mouth daily.   metoprolol succinate (TOPROL-XL) 100 MG 24 hr tablet Take 1 tablet (100 mg total) by mouth daily. TAKE WITH OR IMMEDIATELY FOLLOWING A MEAL.   minoxidil (LONITEN) 10 MG tablet Take 1 tablet (10 mg total) by mouth daily.   rosuvastatin (CRESTOR) 5 MG tablet Take 1 tablet (5 mg total) by mouth daily.   [DISCONTINUED] metoprolol tartrate (LOPRESSOR) 25 MG tablet Take 2 tablets (50 mg total) by mouth 2 (two) times daily.   No facility-administered encounter medications on file as of 11/28/2021.    Surgical History: Past Surgical History:  Procedure Laterality Date   CESAREAN SECTION  1992   CHOLECYSTECTOMY  10/26/2020   COLONOSCOPY     polyps removed first procedure. 2nd time all was clear   COLONOSCOPY WITH PROPOFOL N/A 03/14/2019   Procedure: COLONOSCOPY WITH PROPOFOL;  Surgeon: Lin Landsman, MD;  Location: [facility] ENDOSCOPY;  Service: Gastroenterology;  Laterality: N/A;   FINGER SURGERY Left 2011   left finger cut off x 2.(only up to last digit)   HEMATOMA EVACUATION Left 1999   upper part of foot was injured d/t 500lb weight landing on her foot.    LAPAROSCOPIC SALPINGO OOPHERECTOMY Left 04/02/2018    Procedure: LAPAROSCOPIC SALPINGO OOPHORECTOMY;  Surgeon: Malachy Mood, MD;  Location: [facility] ORS;  Service: Gynecology;  Laterality: Left;    Medical History: Past Medical History:  Diagnosis Date   Anginal pain (HCC)    tightness related to anxiety   Anxiety    Cholecystolithiasis    Depression    Diabetes mellitus without complication (HCC)    GERD (gastroesophageal reflux disease)    throws up easily but not diagnosed with reflux   History of C-section 1992   Hypertension    Sleep apnea    uses cpap    Family History: Family History  Problem Relation Age of Onset   Breast cancer Mother 20       x 3 times   Colon cancer Mother    Brain cancer Mother    Cancer - Colon Mother    Cancer - Other Mother    Heart Problems Father    Colon cancer Brother    Heart Problems Brother     Social History  Socioeconomic History   Marital status: Widowed    Spouse name: Not on file   Number of children: 1   Years of education: Not on file   Highest education level: Not on file  Occupational History   Occupation: works in Secretary/administrator  Tobacco Use   Smoking status: Never   Smokeless tobacco: Never  Vaping Use   Vaping Use: Never used  Substance and Sexual Activity   Alcohol use: No   Drug use: No   Sexual activity: Not Currently    Birth control/protection: Post-menopausal  Other Topics Concern   Not on file  Social History Narrative   Son has schizophrenia and autism but is fully capable of helping mother after surgery   Social Determinants of Health   Financial Resource Strain: Not on file  Food Insecurity: Not on file  Transportation Needs: Not on file  Physical Activity: Not on file  Stress: Not on file  Social Connections: Not on file  Intimate Partner Violence: Not on file      Review of Systems  Constitutional:  Negative for activity change, appetite change, chills, fatigue, fever and unexpected weight change.  HENT: Negative.  Negative for  congestion, ear pain, rhinorrhea, sore throat and trouble swallowing.   Eyes: Negative.   Respiratory:  Positive for shortness of breath (intermittent). Negative for cough, chest tightness and wheezing.   Cardiovascular: Negative.  Negative for chest pain and palpitations.  Gastrointestinal: Negative.  Negative for abdominal pain, blood in stool, constipation, diarrhea, nausea and vomiting.  Endocrine: Negative.   Genitourinary: Negative.  Negative for difficulty urinating, dysuria, frequency, hematuria and urgency.  Musculoskeletal: Negative.  Negative for arthralgias, back pain, joint swelling, myalgias and neck pain.  Skin: Negative.  Negative for rash and wound.  Allergic/Immunologic: Negative.  Negative for immunocompromised state.  Neurological: Negative.  Negative for dizziness, seizures, numbness and headaches.  Hematological: Negative.   Psychiatric/Behavioral:  Positive for depression. Negative for behavioral problems, self-injury and suicidal ideas. The patient is not nervous/anxious.     Vital Signs: BP (!) 143/70   Pulse 78   Temp 98.7 F (37.1 C)   Resp 16   Ht _0  (1.753 m)   Wt (!) 338 lb (153.3 kg)   SpO2 98%   BMI 49.91 kg/m    Physical Exam Vitals reviewed.  Constitutional:      General: She is awake. She is not in acute distress.    Appearance: Normal appearance. She is well-developed and well-groomed. She is morbidly obese. She is not ill-appearing or diaphoretic.  HENT:     Head: Normocephalic and atraumatic.     Right Ear: Tympanic membrane, ear canal and external ear normal.     Left Ear: Tympanic membrane, ear canal and external ear normal.     Nose: Nose normal. No congestion or rhinorrhea.     Mouth/Throat:     Lips: Pink.     Mouth: Mucous membranes are moist.     Pharynx: Oropharynx is clear. Uvula midline. No oropharyngeal exudate or posterior oropharyngeal erythema.  Eyes:     General: Lids are normal. Vision grossly intact. Gaze aligned  appropriately. No scleral icterus.       Right eye: No discharge.        Left eye: No discharge.     Extraocular Movements: Extraocular movements intact.     Conjunctiva/sclera: Conjunctivae normal.     Pupils: Pupils are equal, round, and reactive to light.     Funduscopic  exam:    Right eye: Red reflex present.        Left eye: Red reflex present. Neck:     Thyroid: No thyromegaly.     Vascular: No JVD.     Trachea: Trachea and phonation normal. No tracheal deviation.  Cardiovascular:     Rate and Rhythm: Normal rate and regular rhythm.     Pulses:          Carotid pulses are 3+ on the right side and 3+ on the left side.      Radial pulses are 2+ on the right side and 2+ on the left side.       Dorsalis pedis pulses are 2+ on the right side and 2+ on the left side.       Posterior tibial pulses are 1+ on the right side and 1+ on the left side.     Heart sounds: Normal heart sounds, S1 normal and S2 normal. No murmur heard.    No friction rub. No gallop.  Pulmonary:     Effort: Pulmonary effort is normal. No accessory muscle usage or respiratory distress.     Breath sounds: Normal breath sounds and air entry. No stridor. No wheezing or rales.  Chest:     Chest wall: No tenderness.     Comments: Declined clinical breast exam, patient had a normal mammogram in march this year.  Abdominal:     General: Bowel sounds are normal. There is no distension.     Palpations: Abdomen is soft. There is no shifting dullness, fluid wave, mass or pulsatile mass.     Tenderness: There is no abdominal tenderness. There is no guarding or rebound.  Musculoskeletal:        General: No tenderness or deformity.     Cervical back: Normal range of motion and neck supple.     Right lower leg: 1+ Pitting Edema present.     Left lower leg: 1+ Pitting Edema present.     Right foot: Decreased range of motion. No deformity, bunion, Charcot foot, foot drop or prominent metatarsal heads.     Left foot:  Decreased range of motion. No deformity, bunion, Charcot foot, foot drop or prominent metatarsal heads.  Feet:     Right foot:     Protective Sensation: 6 sites tested.  6 sites sensed.     Skin integrity: Callus and dry skin present. No ulcer, blister, skin breakdown, erythema, warmth or fissure.     Toenail Condition: Right toenails are abnormally thick and long. Fungal disease present.    Left foot:     Protective Sensation: 6 sites tested.  6 sites sensed.     Skin integrity: Callus and dry skin present. No ulcer, blister, skin breakdown, warmth or fissure.     Toenail Condition: Left toenails are abnormally thick and long. Fungal disease present. Lymphadenopathy:     Cervical: No cervical adenopathy.  Skin:    General: Skin is warm and dry.     Capillary Refill: Capillary refill takes less than 2 seconds.     Coloration: Skin is not pale.     Findings: No erythema or rash.  Neurological:     Mental Status: She is alert and oriented to person, place, and time.     Cranial Nerves: No cranial nerve deficit.     Motor: No abnormal muscle tone.     Coordination: Coordination normal.     Gait: Gait normal.     Deep Tendon  Reflexes: Reflexes are normal and symmetric.  Psychiatric:        Mood and Affect: Mood and affect normal.        Behavior: Behavior normal. Behavior is cooperative.        Thought Content: Thought content normal.        Judgment: Judgment normal.        Assessment/Plan: 1. Encounter for general adult medical examination with abnormal findings Age-appropriate preventive screenings and vaccinations discussed, annual physical exam completed. Routine labs for health maintenance ordered, see below. PHM updated.   2. Type 2 diabetes mellitus with hyperglycemia, without long-term current use of insulin (HCC) A1C is improved some but still elevated at 7.4, continue with current medications and diet modifications. Labs ordered, trulicity refills ordered.  - POCT HgB  A1C - CBC with Differential/Platelet - CMP14+EGFR - TSH + free T4 - Dulaglutide (TRULICITY) 3 TG/5.4DI SOPN; Inject 3 mg as directed once a week.  Dispense: 6 mL; Refill: 2  3. Essential hypertension All medication refills ordered, labs ordered.  - CBC with Differential/Platelet - TSH + free T4 - hydrochlorothiazide (HYDRODIURIL) 25 MG tablet; Take 1 tablet (25 mg total) by mouth daily.  Dispense: 90 tablet; Refill: 3 - chlorthalidone (HYGROTON) 25 MG tablet; Take 1 tablet (25 mg total) by mouth daily.  Dispense: 30 tablet; Refill: 2 - metoprolol succinate (TOPROL-XL) 100 MG 24 hr tablet; Take 1 tablet (100 mg total) by mouth daily. TAKE WITH OR IMMEDIATELY FOLLOWING A MEAL.  Dispense: 90 tablet; Refill: 1 - minoxidil (LONITEN) 10 MG tablet; Take 1 tablet (10 mg total) by mouth daily.  Dispense: 90 tablet; Refill: 2 - amLODipine (NORVASC) 10 MG tablet; Take 1 tablet (10 mg total) by mouth daily.  Dispense: 90 tablet; Refill: 1  4. Bilateral lower extremity edema Labs ordered. Chlorthalidone prescribed to help decrease lower extremity edema.  - CBC with Differential/Platelet - CMP14+EGFR - chlorthalidone (HYGROTON) 25 MG tablet; Take 1 tablet (25 mg total) by mouth daily.  Dispense: 30 tablet; Refill: 2  5. COPD without exacerbation (Protection) Labs ordered, breztri refills ordered. CT chest ordered.  - CBC with Differential/Platelet - TSH + free T4 - Budeson-Glycopyrrol-Formoterol (BREZTRI AEROSPHERE) 160-9-4.8 MCG/ACT AERO; Inhale 2 puffs into the lungs 2 (two) times daily.  Dispense: 10.7 g; Refill: 11 - CT Chest Wo Contrast; Future  6. Mild persistent asthma without complication CT chest ordered, albuterol refills ordered.  - CBC with Differential/Platelet - albuterol (VENTOLIN HFA) 108 (90 Base) MCG/ACT inhaler; TAKE 2 PUFFS BY MOUTH EVERY 6 HOURS AS NEEDED FOR WHEEZE OR SHORTNESS OF BREATH  Dispense: 18 g; Refill: 3 - CT Chest Wo Contrast; Future  7. Multiple lung nodules on  CT Routine CT chest ordered to screen for lung cancer.  - CT Chest Wo Contrast; Future  8. Hair loss disorder Routine labs ordered, oral minoxidil ordered.  - CBC with Differential/Platelet - CMP14+EGFR - Vitamin D (25 hydroxy) - TSH + free T4 - minoxidil (LONITEN) 10 MG tablet; Take 1 tablet (10 mg total) by mouth daily.  Dispense: 90 tablet; Refill: 2  9. Mixed hyperlipidemia Continue rosuvastatin as prescribed. Refills ordered. Routine labs ordered.  - CBC with Differential/Platelet - CMP14+EGFR - Lipid Profile - TSH + free T4 - rosuvastatin (CRESTOR) 5 MG tablet; Take 1 tablet (5 mg total) by mouth daily.  Dispense: 90 tablet; Refill: 1  10. Vasomotor rhinitis Flonase refills ordered.  - fluticasone (FLONASE) 50 MCG/ACT nasal spray; Place 2 sprays into both nostrils daily.  Dispense: 16 g; Refill: 6  11. Cervical radiculopathy Neck pain is managed by Dr. Holley Raring.  - TSH + free T4  12. Impingement syndrome of right shoulder Significantly improved.   13. Vitamin D deficiency Routine lab ordered.  - Vitamin D (25 hydroxy)  14. B12 deficiency Routine labs ordered.  - CBC with Differential/Platelet - B12 and Folate Panel  15. Dysuria Routine urinalysis done  - UA/M w/rflx Culture, Routine - Microscopic Examination  16. Major depressive disorder, recurrent episode, moderate (HCC) Stable, refills ordered.  - desvenlafaxine (PRISTIQ) 100 MG 24 hr tablet; Take 1 tablet (100 mg total) by mouth daily.  Dispense: 90 tablet; Refill: 1      General Counseling: Willy verbalizes understanding of the findings of todays visit and agrees with plan of treatment. I have discussed any further diagnostic evaluation that may be needed or ordered today. We also reviewed her medications today. she has been encouraged to call the office with any questions or concerns that should arise related to todays visit.    Orders Placed This Encounter  Procedures   CT Chest Wo Contrast    UA/M w/rflx Culture, Routine   CBC with Differential/Platelet   CMP14+EGFR   Lipid Profile   Vitamin D (25 hydroxy)   TSH + free T4   B12 and Folate Panel   POCT HgB A1C    Meds ordered this encounter  Medications   rosuvastatin (CRESTOR) 5 MG tablet    Sig: Take 1 tablet (5 mg total) by mouth daily.    Dispense:  90 tablet    Refill:  1   Dulaglutide (TRULICITY) 3 NH/6.5BX SOPN    Sig: Inject 3 mg as directed once a week.    Dispense:  6 mL    Refill:  2   hydrochlorothiazide (HYDRODIURIL) 25 MG tablet    Sig: Take 1 tablet (25 mg total) by mouth daily.    Dispense:  90 tablet    Refill:  3   chlorthalidone (HYGROTON) 25 MG tablet    Sig: Take 1 tablet (25 mg total) by mouth daily.    Dispense:  30 tablet    Refill:  2   Budeson-Glycopyrrol-Formoterol (BREZTRI AEROSPHERE) 160-9-4.8 MCG/ACT AERO    Sig: Inhale 2 puffs into the lungs 2 (two) times daily.    Dispense:  10.7 g    Refill:  11   fluticasone (FLONASE) 50 MCG/ACT nasal spray    Sig: Place 2 sprays into both nostrils daily.    Dispense:  16 g    Refill:  6   metoprolol succinate (TOPROL-XL) 100 MG 24 hr tablet    Sig: Take 1 tablet (100 mg total) by mouth daily. TAKE WITH OR IMMEDIATELY FOLLOWING A MEAL.    Dispense:  90 tablet    Refill:  1   desvenlafaxine (PRISTIQ) 100 MG 24 hr tablet    Sig: Take 1 tablet (100 mg total) by mouth daily.    Dispense:  90 tablet    Refill:  1   minoxidil (LONITEN) 10 MG tablet    Sig: Take 1 tablet (10 mg total) by mouth daily.    Dispense:  90 tablet    Refill:  2   amLODipine (NORVASC) 10 MG tablet    Sig: Take 1 tablet (10 mg total) by mouth daily.    Dispense:  90 tablet    Refill:  1   albuterol (VENTOLIN HFA) 108 (90 Base) MCG/ACT inhaler    Sig: TAKE 2  PUFFS BY MOUTH EVERY 6 HOURS AS NEEDED FOR WHEEZE OR SHORTNESS OF BREATH    Dispense:  18 g    Refill:  3    Return in about 3 months (around 02/28/2022) for F/U, Recheck A1C, Dominica Kent PCP.   Total time spent:30  Minutes Time spent includes review of chart, medications, test results, and follow up plan with the patient.   [place] Controlled Substance Database was reviewed by me.  This patient was seen by Jonetta Osgood, FNP-C in collaboration with Dr. Clayborn Bigness as a part of collaborative care agreement.  Nicholas Trompeter R. Valetta Fuller, MSN, FNP-C Internal medicine

## 2021-11-29 ENCOUNTER — Telehealth: Payer: Self-pay

## 2021-11-29 LAB — UA/M W/RFLX CULTURE, ROUTINE
Bilirubin, UA: NEGATIVE
Glucose, UA: NEGATIVE
Ketones, UA: NEGATIVE
Leukocytes,UA: NEGATIVE
Nitrite, UA: NEGATIVE
Protein,UA: NEGATIVE
RBC, UA: NEGATIVE
Specific Gravity, UA: 1.019 (ref 1.005–1.030)
Urobilinogen, Ur: 0.2 mg/dL (ref 0.2–1.0)
pH, UA: 5.5 (ref 5.0–7.5)

## 2021-11-29 LAB — MICROSCOPIC EXAMINATION
Casts: NONE SEEN /lpf
RBC, Urine: NONE SEEN /hpf (ref 0–2)
WBC, UA: NONE SEEN /hpf (ref 0–5)

## 2021-11-29 NOTE — Telephone Encounter (Signed)
Lvm with patient's chest CT appointment and location-Candace Clayton

## 2021-12-03 LAB — CMP14+EGFR
ALT: 24 IU/L (ref 0–32)
AST: 19 IU/L (ref 0–40)
Albumin/Globulin Ratio: 1.8 (ref 1.2–2.2)
Albumin: 4.1 g/dL (ref 3.8–4.8)
Alkaline Phosphatase: 101 IU/L (ref 44–121)
BUN/Creatinine Ratio: 16 (ref 12–28)
BUN: 12 mg/dL (ref 8–27)
Bilirubin Total: 0.4 mg/dL (ref 0.0–1.2)
CO2: 27 mmol/L (ref 20–29)
Calcium: 9.7 mg/dL (ref 8.7–10.3)
Chloride: 100 mmol/L (ref 96–106)
Creatinine, Ser: 0.77 mg/dL (ref 0.57–1.00)
Globulin, Total: 2.3 g/dL (ref 1.5–4.5)
Glucose: 149 mg/dL — ABNORMAL HIGH (ref 70–99)
Potassium: 4.4 mmol/L (ref 3.5–5.2)
Sodium: 139 mmol/L (ref 134–144)
Total Protein: 6.4 g/dL (ref 6.0–8.5)
eGFR: 88 mL/min/{1.73_m2} (ref >59)

## 2021-12-03 LAB — LIPID PANEL
Chol/HDL Ratio: 2.6 ratio (ref 0.0–4.4)
Cholesterol, Total: 128 mg/dL (ref 100–199)
HDL: 49 mg/dL (ref >39)
LDL Chol Calc (NIH): 53 mg/dL (ref 0–99)
Triglycerides: 155 mg/dL — ABNORMAL HIGH (ref 0–149)
VLDL Cholesterol Cal: 26 mg/dL (ref 5–40)

## 2021-12-03 LAB — VITAMIN D 25 HYDROXY (VIT D DEFICIENCY, FRACTURES): Vit D, 25-Hydroxy: 40.9 ng/mL (ref 30.0–100.0)

## 2021-12-03 LAB — CBC WITH DIFFERENTIAL/PLATELET
Basophils Absolute: 0.1 10*3/uL (ref 0.0–0.2)
Basos: 1 %
EOS (ABSOLUTE): 0 10*3/uL (ref 0.0–0.4)
Eos: 0 %
Hematocrit: 36.5 % (ref 34.0–46.6)
Hemoglobin: 12 g/dL (ref 11.1–15.9)
Immature Grans (Abs): 0 10*3/uL (ref 0.0–0.1)
Immature Granulocytes: 1 %
Lymphocytes Absolute: 1.4 10*3/uL (ref 0.7–3.1)
Lymphs: 22 %
MCH: 26.9 pg (ref 26.6–33.0)
MCHC: 32.9 g/dL (ref 31.5–35.7)
MCV: 82 fL (ref 79–97)
Monocytes Absolute: 0.5 10*3/uL (ref 0.1–0.9)
Monocytes: 7 %
Neutrophils Absolute: 4.4 10*3/uL (ref 1.4–7.0)
Neutrophils: 69 %
Platelets: 249 10*3/uL (ref 150–450)
RBC: 4.46 x10E6/uL (ref 3.77–5.28)
RDW: 14.1 % (ref 11.7–15.4)
WBC: 6.4 10*3/uL (ref 3.4–10.8)

## 2021-12-03 LAB — TSH+FREE T4
Free T4: 1.07 ng/dL (ref 0.82–1.77)
TSH: 1.36 u[IU]/mL (ref 0.450–4.500)

## 2021-12-03 LAB — B12 AND FOLATE PANEL
Folate: 7 ng/mL (ref >3.0)
Vitamin B-12: 136 pg/mL — ABNORMAL LOW (ref 232–1245)

## 2021-12-07 ENCOUNTER — Ambulatory Visit
Admission: RE | Admit: 2021-12-07 | Discharge: 2021-12-07 | Disposition: A | Discharge status: Home / Self Care | Payer: No Typology Code available for payment source | Source: Ambulatory Visit | Attending: Nurse Practitioner | Admitting: Nurse Practitioner

## 2021-12-07 DIAGNOSIS — J4530 Mild persistent asthma, uncomplicated: Secondary | ICD-10-CM | POA: Diagnosis present

## 2021-12-07 DIAGNOSIS — R918 Other nonspecific abnormal finding of lung field: Secondary | ICD-10-CM | POA: Diagnosis present

## 2021-12-07 DIAGNOSIS — J449 Chronic obstructive pulmonary disease, unspecified: Secondary | ICD-10-CM | POA: Insufficient documentation

## 2022-01-04 ENCOUNTER — Ambulatory Visit: Payer: No Typology Code available for payment source | Admitting: Nurse Practitioner

## 2022-01-04 ENCOUNTER — Encounter: Payer: Self-pay | Admitting: Nurse Practitioner

## 2022-01-04 ENCOUNTER — Ambulatory Visit
Payer: No Typology Code available for payment source | Admitting: Student in an Organized Health Care Education/Training Program

## 2022-01-04 VITALS — BP 152/74 | HR 74 | Temp 98.7°F | Resp 16 | Ht 69.0 in | Wt 350.4 lb

## 2022-01-04 DIAGNOSIS — E1165 Type 2 diabetes mellitus with hyperglycemia: Secondary | ICD-10-CM

## 2022-01-04 DIAGNOSIS — E538 Deficiency of other specified B group vitamins: Secondary | ICD-10-CM | POA: Diagnosis not present

## 2022-01-04 DIAGNOSIS — I10 Essential (primary) hypertension: Secondary | ICD-10-CM

## 2022-01-04 DIAGNOSIS — S39012A Strain of muscle, fascia and tendon of lower back, initial encounter: Secondary | ICD-10-CM

## 2022-01-04 DIAGNOSIS — R918 Other nonspecific abnormal finding of lung field: Secondary | ICD-10-CM

## 2022-01-04 MED ORDER — CYANOCOBALAMIN 1000 MCG/ML IJ SOLN
1000.0000 ug | Freq: Once | INTRAMUSCULAR | Status: AC
  Administered 2022-01-04: 1000 ug via INTRAMUSCULAR

## 2022-01-04 NOTE — Progress Notes (Signed)
[facility] [place], [place] [address]  Internal MEDICINE  Office Visit Note  Patient Name: Candace Clayton  [id number]  [id number]  Date of Service: 01/04/2022  Chief Complaint  Patient presents with   Follow-up    Strained lower right side of back almost at her hip, happened July 5th   Anxiety   Depression   Diabetes   Gastroesophageal Reflux   Hypertension    HPI Makeisha presents for a follow up visit for lab results and also strained a muscle in her lower right side of back near her hip. Reports that this happened on July 5th. Needs work note Reviewed labs with patient: --B12 136 significantly low Vitamin D and thyroid levels are normal  Cholesterol levels all remain normal except for slightly elevated triglycerides that have significantly improved since last year.  Metabolic panel is normal except for elevated glucose CT chest results: dilated main pulmonary artery (4 cm), previous lung nodules on prior imaging have resolved or remain stable. Nodule in left lower and upper lobes have resolved. 8m ill-defined residual nodule in right upper lobe, no new nodules. Fatty liver and aortic atherosclerosis noted on Ct chest.  Pulmonary artery is suspicious for pulmonary arterial hypertension which is an observation consistent with prior imaging.    Current Medication: Outpatient Encounter Medications as of 01/04/2022  Medication Sig   albuterol (VENTOLIN HFA) 108 (90 Base) MCG/ACT inhaler TAKE 2 PUFFS BY MOUTH EVERY 6 HOURS AS NEEDED FOR WHEEZE OR SHORTNESS OF BREATH   amLODipine (NORVASC) 10 MG tablet Take 1 tablet (10 mg total) by mouth daily.   Budeson-Glycopyrrol-Formoterol (BREZTRI AEROSPHERE) 160-9-4.8 MCG/ACT AERO Inhale 2 puffs into the lungs 2 (two) times daily.   chlorthalidone (HYGROTON) 25 MG tablet Take 1 tablet (25 mg total) by mouth daily.   desvenlafaxine (PRISTIQ) 100 MG 24 hr tablet Take 1 tablet (100 mg total) by mouth daily.    Dulaglutide (TRULICITY) 3 MEQ/6.8TMSOPN Inject 3 mg as directed once a week.   fluticasone (FLONASE) 50 MCG/ACT nasal spray Place 2 sprays into both nostrils daily.   glucose blood (CONTOUR NEXT TEST) test strip 1 each by Other route daily. DX E11.65   hydrochlorothiazide (HYDRODIURIL) 25 MG tablet Take 1 tablet (25 mg total) by mouth daily.   ipratropium-albuterol (DUONEB) 0.5-2.5 (3) MG/3ML SOLN Take 3 mLs by nebulization every 4 (four) hours as needed (shortness of breath or wheezing).   metFORMIN (GLUCOPHAGE) 500 MG tablet TAKE 1 TABLET BY MOUTH 2 TIMES DAILY WITH A MEAL.   metoprolol succinate (TOPROL-XL) 100 MG 24 hr tablet Take 1 tablet (100 mg total) by mouth daily. TAKE WITH OR IMMEDIATELY FOLLOWING A MEAL.   Microlet Lancets MISC 1 each by Does not apply route daily. DX-E11.65   minoxidil (LONITEN) 10 MG tablet Take 1 tablet (10 mg total) by mouth daily.   Multiple Vitamins-Calcium (ONE-A-DAY WOMENS PO) Take 1 tablet by mouth daily.   NON FORMULARY cpap device   OVER THE COUNTER MEDICATION 1 tablet daily as needed. Takes generic stool softener for constipation d/t oxycodone.    oxyCODONE (OXY IR/ROXICODONE) 5 MG immediate release tablet Take by mouth.   pregabalin (LYRICA) 100 MG capsule 100 mg qAM, 200 mg qhs   rosuvastatin (CRESTOR) 5 MG tablet Take 1 tablet (5 mg total) by mouth daily.   Vitamin D, Ergocalciferol, (DRISDOL) 1.25 MG (50000 UNIT) CAPS capsule TAKE 1 CAPSULE BY MOUTH ONE TIME PER WEEK   [DISCONTINUED] busPIRone (BUSPAR) 15 MG  tablet TAKE 1 TABLET (15 MG TOTAL) BY MOUTH 3 (THREE) TIMES DAILY FOR ANXIETY.   [DISCONTINUED] oxyCODONE-acetaminophen (PERCOCET/ROXICET) 5-325 MG tablet Take 1 tablet by mouth every 8 (eight) hours as needed for severe pain.   [DISCONTINUED] metoprolol tartrate (LOPRESSOR) 25 MG tablet Take 2 tablets (50 mg total) by mouth 2 (two) times daily.   [DISCONTINUED] oxyCODONE-acetaminophen (PERCOCET/ROXICET) 5-325 MG tablet Take 1 tablet by mouth every  8 (eight) hours as needed for severe pain.   [DISCONTINUED] oxyCODONE-acetaminophen (PERCOCET/ROXICET) 5-325 MG tablet Take 1 tablet by mouth every 8 (eight) hours as needed for severe pain.   [EXPIRED] cyanocobalamin ((VITAMIN B-12)) injection 1,000 mcg    No facility-administered encounter medications on file as of 01/04/2022.    Surgical History: Past Surgical History:  Procedure Laterality Date   CESAREAN SECTION  1992   CHOLECYSTECTOMY  10/26/2020   COLONOSCOPY     polyps removed first procedure. 2nd time all was clear   COLONOSCOPY WITH PROPOFOL N/A 03/14/2019   Procedure: COLONOSCOPY WITH PROPOFOL;  Surgeon: Lin Landsman, MD;  Location: [facility] END[facility]COPY;  Service: Gastroenterology;  Laterality: N/A;   FINGER SURGERY Left 2011   left finger cut off x 2.(only up to last digit)   HEMATOMA EVACUATION Left 1999   upper part of foot was injured d/t 500lb weight landing on her foot.    LAPAROSCOPIC SALPINGO OOPHERECTOMY Left 04/02/2018   Procedure: LAPAROSCOPIC SALPINGO OOPHORECTOMY;  Surgeon: Malachy Mood, MD;  Location: [facility] ORS;  Service: Gynecology;  Laterality: Left;    Medical History: Past Medical History:  Diagnosis Date   Anginal pain (HCC)    tightness related to anxiety   Anxiety    Cholecystolithiasis    Depression    Diabetes mellitus without complication (HCC)    GERD (gastroesophageal reflux disease)    throws up easily but not diagnosed with reflux   History of C-section 1992   Hypertension    Sleep apnea    uses cpap    Family History: Family History  Problem Relation Age of Onset   Breast cancer Mother 67       x 3 times   Colon cancer Mother    Brain cancer Mother    Cancer - Colon Mother    Cancer - Other Mother    Heart Problems Father    Colon cancer Brother    Heart Problems Brother     Social History   Socioeconomic History   Marital status: Widowed    Spouse name: Not on file   Number of children: 1   Years of education: Not  on file   Highest education level: Not on file  Occupational History   Occupation: works in Secretary/administrator  Tobacco Use   Smoking status: Never   Smokeless tobacco: Never  Vaping Use   Vaping Use: Never used  Substance and Sexual Activity   Alcohol use: No   Drug use: No   Sexual activity: Not Currently    Birth control/protection: Post-menopausal  Other Topics Concern   Not on file  Social History Narrative   Son has schizophrenia and autism but is fully capable of helping mother after surgery   Social Determinants of Health   Financial Resource Strain: Not on file  Food Insecurity: Not on file  Transportation Needs: Not on file  Physical Activity: Not on file  Stress: Not on file  Social Connections: Not on file  Intimate Partner Violence: Not on file      Review  of Systems  Constitutional:  Positive for fatigue. Negative for chills and unexpected weight change.  HENT:  Negative for congestion, rhinorrhea, sneezing and sore throat.   Eyes:  Negative for redness.  Respiratory: Negative.  Negative for cough, chest tightness, shortness of breath and wheezing.   Cardiovascular: Negative.  Negative for chest pain and palpitations.  Gastrointestinal:  Negative for abdominal pain, constipation, diarrhea, nausea and vomiting.  Genitourinary:  Negative for dysuria and frequency.  Musculoskeletal:  Positive for arthralgias, back pain and joint swelling.       Right lower back strain  Skin:  Negative for rash.  Neurological: Negative.  Negative for tremors and numbness.  Hematological:  Negative for adenopathy. Does not bruise/bleed easily.  Psychiatric/Behavioral:  Negative for behavioral problems (Depression), sleep disturbance and suicidal ideas. The patient is not nervous/anxious.     Vital Signs: BP (!) 152/74 Comment: 155/80  Pulse 74   Temp 98.7 F (37.1 C)   Resp 16   Ht '5\' 9"'$  (1.753 m)   Wt (!) 350 lb 6.4 oz (158.9 kg)   SpO2 98%   BMI 51.75 kg/m     Physical Exam Vitals reviewed.  Constitutional:      General: She is not in acute distress.    Appearance: Normal appearance. She is not ill-appearing.  HENT:     Head: Normocephalic and atraumatic.  Eyes:     Pupils: Pupils are equal, round, and reactive to light.  Cardiovascular:     Rate and Rhythm: Normal rate and regular rhythm.  Pulmonary:     Effort: Pulmonary effort is normal. No respiratory distress.  Musculoskeletal:     Comments: Lower back on right side slightly tender to touch  Neurological:     Mental Status: She is alert and oriented to person, place, and time.  Psychiatric:        Mood and Affect: Mood normal.        Behavior: Behavior normal.        Assessment/Plan: 1. B12 deficiency B12 injection administered IM today, repeat weekly for 2 more weeks then change to monthly injections for 5 more months, repeat B12 level in 6 months.  - cyanocobalamin ((VITAMIN B-12)) injection 1,000 mcg  2. Essential hypertension Elevated but patient is also in pain from low back strain today. Will review at next office visit.   3. Multiple lung nodules on CT Most have resolved or remain stable. No new nodules, no significant changes   4. Type 2 diabetes mellitus with hyperglycemia, without long-term current use of insulin (HCC) A1c due to be checked in September but has continued to improve  5. Strain of muscle, fascia and tendon of lower back, initial encounter Patient sees Dr. Holley Raring for pain management. She has pain medication prescribed by him at home and states that the pain is manageable with what she has available to her.    General Counseling: Kemisha verbalizes understanding of the findings of todays visit and agrees with plan of treatment. I have discussed any further diagnostic evaluation that may be needed or ordered today. We also reviewed her medications today. she has been encouraged to call the office with any questions or concerns that should arise  related to todays visit.    No orders of the defined types were placed in this encounter.   Meds ordered this encounter  Medications   cyanocobalamin ((VITAMIN B-12)) injection 1,000 mcg    Return if symptoms worsen or fail to improve, for also follow up  with pain management as scheduled. need wkly b12 inj x2 more weeks then monthly.   Total time spent:30 Minutes Time spent includes review of chart, medications, test results, and follow up plan with the patient.   [place] Controlled Substance Database was reviewed by me.  This patient was seen by Jonetta Osgood, FNP-C in collaboration with Dr. Clayborn Bigness as a part of collaborative care agreement.   Konstantine Gervasi R. Valetta Fuller, MSN, FNP-C Internal medicine

## 2022-01-05 ENCOUNTER — Encounter: Payer: Self-pay | Admitting: Student in an Organized Health Care Education/Training Program

## 2022-01-05 ENCOUNTER — Ambulatory Visit
Payer: No Typology Code available for payment source | Attending: Student in an Organized Health Care Education/Training Program | Admitting: Student in an Organized Health Care Education/Training Program

## 2022-01-05 ENCOUNTER — Telehealth: Payer: Self-pay

## 2022-01-05 VITALS — BP 159/76 | HR 87 | Temp 97.9°F | Resp 16 | Ht 69.0 in | Wt 350.0 lb

## 2022-01-05 DIAGNOSIS — G894 Chronic pain syndrome: Secondary | ICD-10-CM

## 2022-01-05 DIAGNOSIS — S39012A Strain of muscle, fascia and tendon of lower back, initial encounter: Secondary | ICD-10-CM | POA: Insufficient documentation

## 2022-01-05 DIAGNOSIS — M5136 Other intervertebral disc degeneration, lumbar region: Secondary | ICD-10-CM | POA: Diagnosis not present

## 2022-01-05 DIAGNOSIS — M5412 Radiculopathy, cervical region: Secondary | ICD-10-CM

## 2022-01-05 DIAGNOSIS — M47816 Spondylosis without myelopathy or radiculopathy, lumbar region: Secondary | ICD-10-CM

## 2022-01-05 DIAGNOSIS — S39012S Strain of muscle, fascia and tendon of lower back, sequela: Secondary | ICD-10-CM

## 2022-01-05 MED ORDER — OXYCODONE-ACETAMINOPHEN 5-325 MG PO TABS: 1.0000 | ORAL_TABLET | Freq: Three times a day (TID) | ORAL | 0 refills | Status: DC | PRN

## 2022-01-05 MED ORDER — METHOCARBAMOL 750 MG PO TABS: 750.0000 mg | ORAL_TABLET | Freq: Three times a day (TID) | ORAL | 1 refills | Status: DC | PRN

## 2022-01-05 NOTE — Patient Instructions (Addendum)
Cancel July MM visit Schedule for TPI next wed afternoon MM in 3 months  Trigger Point Injections Patient Information  Description: Trigger points are areas of muscle sensitive to touch which cause pain with movement, sometimes felt some distance from the site of palpation.  Usually the muscle containing these trigger points if felt as a tight band or knot.   The area of maximum tenderness or trigger point is identified, and after antiseptic preparation of the skin, a small needle is placed into this site.  Reproduction of the pain often occurs and numbing medicine (local anesthetic) is injected into the site, sometimes along with steroid preparation.  The entire block usually lasts less than 5 minutes.  Conditions which may be treated by trigger points:  Muscular pain and spasm Nerve irritation  Preparation for the injection:  Do not eat any solid food or dairy products within 8 hours of your appointment. You may drink clear liquids up to 3 hours before appointment.  Clear liquids include water, black coffee, juice or soda.  No milk or cream please. You may take your regular medications, including pain medications, with a sip of water before your appointment.  Diabetics should hold regular insulin ( if take separately) and take 1/2 normal NPH dose the morning of the procedure.  Carry some sugar containing items with you to your appointment. A driver must accompany you and be prepared to drive you home after your procedure.  Bring all your current medications with you. An IV may be inserted and sedation may be given at the discretion of the physician.  A blood pressure cuff, EKG, and other monitors will often be applied during the procedure.  Some patients may need to have extra oxygen administered for a short period. You will be asked to provide medical information, including your allergies and medications, prior to the procedure.  We must know immediately if you are taking blood thinners  (like Coumadin/Warfarin) or if you are allergic to IV iodine contrast (dye).  We must know if you could possibly be pregnant.  Possible side-effects:  Bleeding from needle site Infection (rare, may require surgery) Nerve injury (rare) Numbness & tingling (temporary) Punctured lung (if injection around chest) Light-headedness (temporary) Pain at injection site (several days) Decreased blood pressure (rare, temporary) Weakness in arm/leg (temporary)  Call if you experience:  Hive or difficulty breathing (go to the emergency room) Inflammation or drainage at the injection site(s)  Please note:  Although the local anesthetic injected can often make your painful muscle feel good for several hours after the injection, the pain may return.  It takes 3-7 days for steroids to work.  You may not notice any pain relief for at least one week.  If effective, we will often do a series of injections spaced 3-6 weeks apart to maximally decrease your pain.  If you have any questions please call [phone] [facility]

## 2022-01-05 NOTE — Progress Notes (Signed)
Safety precautions to be maintained throughout the outpatient stay will include: orient to surroundings, keep bed in low position, maintain call bell within reach at all times, provide assistance with transfer out of bed and ambulation.  

## 2022-01-05 NOTE — Progress Notes (Signed)
PROVIDER NOTE: Information contained herein reflects review and annotations entered in association with encounter. Interpretation of such information and data should be left to medically-trained personnel. Information provided to patient can be located elsewhere in the medical record under "Patient Instructions". Document created using STT-dictation technology, any transcriptional errors that may result from process are unintentional.    Patient: Candace Clayton  Service Category: E/M  Provider: Gillis Santa, MD  DOB: 06/04/1961  DOS: 01/05/2022  Specialty: Interventional Pain Management  MRN: [id number]  Setting: Ambulatory outpatient  PCP: Jonetta Osgood, NP  Type: Established Patient    Referring Provider: Jonetta Osgood, NP  Location: Office  Delivery: Face-to-face     HPI  Ms. KHUSHBU PIPPEN, a 61 y.o. year old female, is here today because of her Acute myofascial strain of lumbar region, sequela [S39.012S]. Ms. Guyett primary complain today is Back Pain (Lower right)  Last encounter: My last encounter with her was on 10/18/21 Pertinent problems: Ms. Pollick has Major depressive disorder, recurrent episode, moderate ([place]); Morbid obesity ([place]); Low back pain with sciatica; OSA on CPAP; Lumbar degenerative disc disease; Lumbar facet arthropathy (R>L); Lumbar spondylosis; Chronic pain syndrome; and Type 2 diabetes mellitus with hyperglycemia, without long-term current use of insulin (HCC) on their pertinent problem list. Pain Assessment: Severity of Chronic pain is reported as a 9 /10. Location: Back Right, Lower/denies. Onset: More than a month ago. Quality: Sharp. Timing: Constant. Modifying factor(s): sitting still, reclining. Vitals:  height is 5' 9"  (1.753 m) and weight is 350 lb (158.8 kg) (abnormal). Her temporal temperature is 97.9 F (36.6 C). Her blood pressure is 159/76 (abnormal) and her pulse is 87. Her respiration is 16 and oxygen saturation is 98%.   Reason for encounter:  medication management.  And acute myofascial strain of lumbar paraspinal muscles.  Unfortunately patient went to the ED on 12/29/2021 where she waited 8 hours given increased lumbar spine pain.  She was prescribed tizanidine at that time which she states has not been helpful.  She is unable to go to work given the muscle tension that she is experiencing.  She states that her oxycodone is not very helpful.  We will refill her medications as below, recommend that she discontinue tizanidine and try Robaxin as below.  If Robaxin is not helpful, we can consider lumbar trigger point injection next week.     Pharmacotherapy Assessment  Analgesic: Percocet 5 mg 3 times daily as needed, quantity 90/month; MME equals 22.5    Monitoring: [place] PMP: PDMP reviewed during this encounter.       Pharmacotherapy: No side-effects or adverse reactions reported. Compliance: No problems identified. Effectiveness: Clinically acceptable.  Rise Patience, RN  01/05/2022 10:04 AM  Sign when Signing Visit Safety precautions to be maintained throughout the outpatient stay will include: orient to surroundings, keep bed in low position, maintain call bell within reach at all times, provide assistance with transfer out of bed and ambulation.     UDS:  Summary  Date Value Ref Range Status  12/28/2020 Note  Final    Comment:    ==================================================================== [employer] [facility] (MW) ==================================================================== Test                             Result       Flag       Units  Drug Present and Declared for Prescription Verification   Oxycodone  1252         EXPECTED   ng/mg creat   Oxymorphone                    748          EXPECTED   ng/mg creat   Noroxycodone                   1206         EXPECTED   ng/mg creat   Noroxymorphone                 232          EXPECTED   ng/mg creat    Sources of oxycodone are scheduled  prescription medications.    Oxymorphone, noroxycodone, and noroxymorphone are expected    metabolites of oxycodone. Oxymorphone is also available as a    scheduled prescription medication.  ==================================================================== Test                      Result    Flag   Units      Ref Range   Creatinine              126              mg/dL      >=20 ==================================================================== Declared Medications:  The flagging and interpretation on this report are based on the  following declared medications.  Unexpected results may arise from  inaccuracies in the declared medications.   **Note: The testing scope of this panel includes these medications:   Oxycodone (Roxicodone)  Oxycodone (Percocet)   **Note: The testing scope of this panel does not include the  following reported medications:   Acetaminophen (Percocet)  Albuterol (Ventolin HFA)  Amlodipine (Norvasc)  Budesonide (Breztri Aerosphere)  Buspirone (Buspar)  Cetirizine (Zyrtec)  Desvenlafaxine (Pristiq)  Docusate (Stool Softener)  Dulaglutide  Enalapril (Vasotec)  Fluticasone (Flonase)  Formoterol (Breztri Aerosphere)  Glycopyrrolate (Breztri Aerosphere)  Hydrochlorothiazide (Hydrodiuril)  Hydroxyzine (Atarax)  Melatonin  Metformin (Glucophage)  Metoprolol (Toprol)  Multivitamin  Ondansetron (Zofran)  Oxybutynin (Ditropan)  Pregabalin (Lyrica)  Rosuvastatin (Crestor)  Vitamin D2 ==================================================================== For clinical consultation, please call [phone]. ====================================================================      ROS  Constitutional: Denies any fever or chills Gastrointestinal: No reported hemesis, hematochezia, vomiting, or acute GI distress Musculoskeletal:  Left low back pain  Neurological: No reported episodes of acute onset apraxia, aphasia, dysarthria, agnosia, amnesia,  paralysis, loss of coordination, or loss of consciousness  Medication Review  Budeson-Glycopyrrol-Formoterol, Dulaglutide, [place] Lancets, Multiple Vitamins-Minerals, NON FORMULARY, OVER THE COUNTER MEDICATION, Vitamin D (Ergocalciferol), albuterol, amLODipine, busPIRone, chlorthalidone, desvenlafaxine, fluticasone, glucose blood, hydrochlorothiazide, ipratropium-albuterol, metFORMIN, methocarbamol, metoprolol succinate, metoprolol tartrate, minoxidil, oxyCODONE, oxyCODONE-acetaminophen, pregabalin, and rosuvastatin  History Review  Allergy: Ms. Defino has No Known Allergies. Drug: Ms. Rau  reports no history of drug use. Alcohol:  reports no history of alcohol use. Tobacco:  reports that she has never smoked. She has never used smokeless tobacco. Social: Ms. Larmore  reports that she has never smoked. She has never used smokeless tobacco. She reports that she does not drink alcohol and does not use drugs. Medical:  has a past medical history of Anginal pain ([place]), Anxiety, Cholecystolithiasis, Depression, Diabetes mellitus without complication ([place]), GERD (gastroesophageal reflux disease), History of C-section (1992), Hypertension, and Sleep apnea. Surgical: Ms. Whack  has a past surgical history that includes Cesarean section (1992); Finger surgery (Left, 2011); Hematoma evacuation (  Left, 1999); Colonoscopy; Laparoscopic salpingo oophorectomy (Left, 04/02/2018); Colonoscopy with propofol (N/A, 03/14/2019); and Cholecystectomy (10/26/2020). Family: family history includes Brain cancer in her mother; Breast cancer (age of onset: 63) in her mother; Cancer - Colon in her mother; Cancer - Other in her mother; Colon cancer in her brother and mother; Heart Problems in her brother and father.  Laboratory Chemistry Profile   Renal Lab Results  Component Value Date   BUN 12 12/02/2021   CREATININE 0.77 12/02/2021   BCR 16 12/02/2021   GFRAA 106 02/23/2020   GFRNONAA 92 02/23/2020     Hepatic Lab Results  Component Value Date   AST 19 12/02/2021   ALT 24 12/02/2021   ALBUMIN 4.1 12/02/2021   ALKPHOS 101 12/02/2021    Electrolytes Lab Results  Component Value Date   NA 139 12/02/2021   K 4.4 12/02/2021   CL 100 12/02/2021   CALCIUM 9.7 12/02/2021    Bone Lab Results  Component Value Date   VD25OH 40.9 12/02/2021    Inflammation (CRP: Acute Phase) (ESR: Chronic Phase) Lab Results  Component Value Date   CRP 2 05/24/2021   ESRSEDRATE 12 05/24/2021          Note: Above Lab results reviewed.  Physical Exam  General appearance: Well nourished, well developed, and well hydrated. In no apparent acute distress Mental status: Alert, oriented x 3 (person, place, & time)       Respiratory: No evidence of acute respiratory distress Eyes: PERLA Vitals: BP (!) 159/76   Pulse 87   Temp 97.9 F (36.6 C) (Temporal)   Resp 16   Ht 5' 9"  (1.753 m)   Wt (!) 350 lb (158.8 kg)   SpO2 98%   BMI 51.69 kg/m  BMI: Estimated body mass index is 51.69 kg/m as calculated from the following:   Height as of this encounter: 5' 9"  (1.753 m).   Weight as of this encounter: 350 lb (158.8 kg). Ideal: Ideal body weight: 66.2 kg (145 lb 15.1 oz) Adjusted ideal body weight: 103.2 kg (227 lb 9.1 oz)  Lumbar Spine Area Exam  Skin & Axial Inspection: No masses, redness, or swelling Alignment: Symmetrical Functional ROM: Limited  Stability: No instability detected Muscle Tone/Strength: Functionally intact. No obvious neuro-muscular anomalies detected. Sensory (Neurological): facetogenic, musculoskeletal    Gait & Posture Assessment  Ambulation: Unassisted Gait: Relatively normal for age and body habitus Posture: WNL    Lower Extremity Exam      Side: Right lower extremity   Side: Left lower extremity  Stability: No instability observed           Stability: No instability observed          Skin & Extremity Inspection: Skin color, temperature, and hair growth are WNL. No  peripheral edema or cyanosis. No masses, redness, swelling, asymmetry, or associated skin lesions. No contractures.   Skin & Extremity Inspection: Skin color, temperature, and hair growth are WNL. No peripheral edema or cyanosis. No masses, redness, swelling, asymmetry, or associated skin lesions. No contractures.  Functional ROM: Pain restricted range of motion                Functional ROM: Unrestricted ROM                  Muscle Tone/Strength: Functionally intact. No obvious neuro-muscular anomalies detected.   Muscle Tone/Strength: Functionally intact. No obvious neuro-muscular anomalies detected.  Sensory (Neurological): Arthropathic        Sensory (Neurological):  Unimpaired        DTR: Patellar: deferred today Achilles: deferred today Plantar: deferred today   DTR: Patellar: deferred today Achilles: deferred today Plantar: deferred today  Palpation: No palpable anomalies   Palpation: No palpable anomalies    Assessment   Status Diagnosis  Having a Flare-up Persistent Controlled 1. Acute myofascial strain of lumbar region, sequela   2. Lumbar degenerative disc disease   3. Chronic pain syndrome   4. Lumbar facet arthropathy    5. Cervical radicular pain (right C5/6)       Plan of Care   Ms. KRYSTI HICKLING has a current medication list which includes the following long-term medication(s): albuterol, amlodipine, chlorthalidone, desvenlafaxine, fluticasone, hydrochlorothiazide, ipratropium-albuterol, metformin, metoprolol succinate, minoxidil, pregabalin, rosuvastatin, [START ON 01/30/2022] oxycodone-acetaminophen, [START ON 03/01/2022] oxycodone-acetaminophen, [START ON 03/31/2022] oxycodone-acetaminophen, and [DISCONTINUED] metoprolol tartrate.  1. Acute myofascial strain of lumbar region, sequela - TRIGGER POINT INJECTION; Future  2. Lumbar degenerative disc disease - oxyCODONE-acetaminophen (PERCOCET/ROXICET) 5-325 MG tablet; Take 1 tablet by mouth every 8 (eight) hours as  needed for severe pain.  Dispense: 90 tablet; Refill: 0 - oxyCODONE-acetaminophen (PERCOCET/ROXICET) 5-325 MG tablet; Take 1 tablet by mouth every 8 (eight) hours as needed for severe pain.  Dispense: 90 tablet; Refill: 0 - oxyCODONE-acetaminophen (PERCOCET/ROXICET) 5-325 MG tablet; Take 1 tablet by mouth every 8 (eight) hours as needed for severe pain.  Dispense: 90 tablet; Refill: 0  3. Chronic pain syndrome - oxyCODONE-acetaminophen (PERCOCET/ROXICET) 5-325 MG tablet; Take 1 tablet by mouth every 8 (eight) hours as needed for severe pain.  Dispense: 90 tablet; Refill: 0 - oxyCODONE-acetaminophen (PERCOCET/ROXICET) 5-325 MG tablet; Take 1 tablet by mouth every 8 (eight) hours as needed for severe pain.  Dispense: 90 tablet; Refill: 0 - oxyCODONE-acetaminophen (PERCOCET/ROXICET) 5-325 MG tablet; Take 1 tablet by mouth every 8 (eight) hours as needed for severe pain.  Dispense: 90 tablet; Refill: 0 - [employer] Select 13 (MW), Urine  4. Lumbar facet arthropathy  - oxyCODONE-acetaminophen (PERCOCET/ROXICET) 5-325 MG tablet; Take 1 tablet by mouth every 8 (eight) hours as needed for severe pain.  Dispense: 90 tablet; Refill: 0 - oxyCODONE-acetaminophen (PERCOCET/ROXICET) 5-325 MG tablet; Take 1 tablet by mouth every 8 (eight) hours as needed for severe pain.  Dispense: 90 tablet; Refill: 0 - oxyCODONE-acetaminophen (PERCOCET/ROXICET) 5-325 MG tablet; Take 1 tablet by mouth every 8 (eight) hours as needed for severe pain.  Dispense: 90 tablet; Refill: 0  5. Cervical radicular pain (right C5/6) -Status post T1-T2 ESI 08/22/2021.  Repeat as needed.   Pharmacotherapy (Medications Ordered): Meds ordered this encounter  Medications   methocarbamol (ROBAXIN) 750 MG tablet    Sig: Take 1 tablet (750 mg total) by mouth every 8 (eight) hours as needed for muscle spasms.    Dispense:  90 tablet    Refill:  1    Do not place this medication, or any other prescription from our practice, on "Automatic  Refill". Patient may have prescription filled one day early if pharmacy is closed on scheduled refill date.   oxyCODONE-acetaminophen (PERCOCET/ROXICET) 5-325 MG tablet    Sig: Take 1 tablet by mouth every 8 (eight) hours as needed for severe pain.    Dispense:  90 tablet    Refill:  0    For chronic pain syndrome. To last for 30 days from fill date.   oxyCODONE-acetaminophen (PERCOCET/ROXICET) 5-325 MG tablet    Sig: Take 1 tablet by mouth every 8 (eight) hours as needed for severe pain.  Dispense:  90 tablet    Refill:  0    For chronic pain syndrome. To last for 30 days from fill date.   oxyCODONE-acetaminophen (PERCOCET/ROXICET) 5-325 MG tablet    Sig: Take 1 tablet by mouth every 8 (eight) hours as needed for severe pain.    Dispense:  90 tablet    Refill:  0    For chronic pain syndrome. To last for 30 days from fill date.   Orders:  Orders Placed This Encounter  Procedures   TRIGGER POINT INJECTION    Standing Status:   Future    Standing Expiration Date:   04/07/2022    Scheduling Instructions:     Lumbar TPI    Order Specific Question:   Where will this procedure be performed?    Answer:   [facility]   [employer] Select 13 (MW), Urine    Volume: 30 ml(s). Minimum 3 ml of urine is needed. Document temperature of fresh sample. Indications: Long term (current) use of opiate analgesic [id number])    Order Specific Question:   Release to patient    Answer:   Immediate     Follow-up plan:   Return in about 6 days (around 01/11/2022) for lumbar TPI, in clinic NS 2 pm.     Status post diagnostic bilateral L3, L4, L5, S1 facet medial branch nerve blocks, status post right L3, L4, L5, S1 RFA on 05/05/2019, 04/15/20; left L3, L4, L5 RFA 05/03/2020.        Recent Visits Date Type Provider Dept  10/18/21 Office Visit Gillis Santa, MD [facility]  Showing recent visits within past 90 days and meeting all other requirements Today's Visits Date Type Provider Dept   01/05/22 Office Visit Gillis Santa, MD [facility]  Showing today's visits and meeting all other requirements Future Appointments No visits were found meeting these conditions. Showing future appointments within next 90 days and meeting all other requirements  I discussed the assessment and treatment plan with the patient. The patient was provided an opportunity to ask questions and all were answered. The patient agreed with the plan and demonstrated an understanding of the instructions.  Patient advised to call back or seek an in-person evaluation if the symptoms or condition worsens.  Duration of encounter: 30 minutes.  Note by: Gillis Santa, MD Date: 01/05/2022; Time: 10:59 AM

## 2022-01-10 ENCOUNTER — Ambulatory Visit (INDEPENDENT_AMBULATORY_CARE_PROVIDER_SITE_OTHER): Payer: No Typology Code available for payment source

## 2022-01-10 ENCOUNTER — Encounter
Payer: No Typology Code available for payment source | Admitting: Student in an Organized Health Care Education/Training Program

## 2022-01-10 DIAGNOSIS — E538 Deficiency of other specified B group vitamins: Secondary | ICD-10-CM

## 2022-01-10 MED ORDER — CYANOCOBALAMIN 1000 MCG/ML IJ SOLN
1000.0000 ug | Freq: Once | INTRAMUSCULAR | Status: AC
  Administered 2022-01-10: 1000 ug via INTRAMUSCULAR

## 2022-01-11 ENCOUNTER — Encounter: Payer: Self-pay | Admitting: Student in an Organized Health Care Education/Training Program

## 2022-01-11 ENCOUNTER — Ambulatory Visit
Payer: No Typology Code available for payment source | Attending: Student in an Organized Health Care Education/Training Program | Admitting: Student in an Organized Health Care Education/Training Program

## 2022-01-11 VITALS — BP 151/74 | HR 91 | Temp 97.0°F | Resp 18 | Ht 69.0 in | Wt 350.0 lb

## 2022-01-11 DIAGNOSIS — M5136 Other intervertebral disc degeneration, lumbar region: Secondary | ICD-10-CM | POA: Insufficient documentation

## 2022-01-11 DIAGNOSIS — S39012S Strain of muscle, fascia and tendon of lower back, sequela: Secondary | ICD-10-CM | POA: Diagnosis present

## 2022-01-11 LAB — TOXASSURE SELECT 13 (MW), URINE

## 2022-01-11 MED ORDER — ROPIVACAINE HCL 2 MG/ML IJ SOLN
20.0000 mL | Freq: Once | INTRAMUSCULAR | Status: AC
  Administered 2022-01-11: 20 mL via PERINEURAL

## 2022-01-11 MED ORDER — ROPIVACAINE HCL 2 MG/ML IJ SOLN
INTRAMUSCULAR | Status: AC
  Filled 2022-01-11: qty 20

## 2022-01-11 NOTE — Progress Notes (Signed)
PROVIDER NOTE: Interpretation of information contained herein should be left to medically-trained personnel. Specific patient instructions are provided elsewhere under "Patient Instructions" section of medical record. This document was created in part using STT-dictation technology, any transcriptional errors that may result from this process are unintentional.  Patient: Candace Clayton Type: Established DOB: 06-21-1961 MRN: [id number] PCP: Jonetta Osgood, NP  Service: Procedure DOS: 01/11/2022 Setting: Ambulatory Location: Ambulatory outpatient facility Delivery: Face-to-face Provider: Gillis Santa, MD Specialty: Interventional Pain Management Specialty designation: 09 Location: Outpatient facility Ref. Prov.: Jonetta Osgood, NP    Primary Reason for Visit: Interventional Pain Management Treatment. CC: Back Pain (Right lower)    Procedure:          Anesthesia, Analgesia, Anxiolysis:  Trigger point injection of lumbar spine including latissimus dorsi, serratus posterior, gluteus medius (3+ muscle groups)  Type: Local Anesthesia Local Anesthetic: Lidocaine 1-2% Sedation: None  Indication(s):  Analgesia Route: Infiltration ([place]/IM) IV Access: N/A   Position: Prone   1. Acute myofascial strain of lumbar region, sequela   2. Lumbar degenerative disc disease    NAS-11 Pain score:   Pre-procedure: 9 /10   Post-procedure: 9 /10     Pre-op H&P Assessment:  Candace Clayton is a 61 y.o. (year old), female patient, seen today for interventional treatment. She  has a past surgical history that includes Cesarean section (1992); Finger surgery (Left, 2011); Hematoma evacuation (Left, 1999); Colonoscopy; Laparoscopic salpingo oophorectomy (Left, 04/02/2018); Colonoscopy with propofol (N/A, 03/14/2019); and Cholecystectomy (10/26/2020). Candace Clayton has a current medication list which includes the following prescription(s): albuterol, amlodipine, breztri aerosphere, buspirone, chlorthalidone,  desvenlafaxine, trulicity, fluticasone, contour next test, hydrochlorothiazide, ipratropium-albuterol, metformin, methocarbamol, metoprolol succinate, microlet lancets, minoxidil, multiple vitamins-minerals, NON FORMULARY, OVER THE COUNTER MEDICATION, oxycodone, [START ON 01/30/2022] oxycodone-acetaminophen, [START ON 03/01/2022] oxycodone-acetaminophen, [START ON 03/31/2022] oxycodone-acetaminophen, pregabalin, rosuvastatin, vitamin d (ergocalciferol), and [DISCONTINUED] metoprolol tartrate. Her primarily concern today is the Back Pain (Right lower)  Initial Vital Signs:  Pulse/HCG Rate: 91  Temp: (!) 97 F (36.1 C) Resp: 18 BP: (!) 151/74 SpO2: 98 %  BMI: Estimated body mass index is 51.69 kg/m as calculated from the following:   Height as of this encounter: '5\' 9"'$  (1.753 m).   Weight as of this encounter: 350 lb (158.8 kg).  Risk Assessment: Allergies: Reviewed. She has No Known Allergies.  Allergy Precautions: None required Coagulopathies: Reviewed. None identified.  Blood-thinner therapy: None at this time Active Infection(s): Reviewed. None identified. Candace Clayton is afebrile  Site Confirmation: Candace Clayton was asked to confirm the procedure and laterality before marking the site Procedure checklist: Completed Consent: Before the procedure and under the influence of no sedative(s), amnesic(s), or anxiolytics, the patient was informed of the treatment options, risks and possible complications. To fulfill our ethical and legal obligations, as recommended by the American Medical Association's Code of Ethics, I have informed the patient of my clinical impression; the nature and purpose of the treatment or procedure; the risks, benefits, and possible complications of the intervention; the alternatives, including doing nothing; the risk(s) and benefit(s) of the alternative treatment(s) or procedure(s); and the risk(s) and benefit(s) of doing nothing. The patient was provided information about the  general risks and possible complications associated with the procedure. These may include, but are not limited to: failure to achieve desired goals, infection, bleeding, organ or nerve damage, allergic reactions, paralysis, and death. In addition, the patient was informed of those risks and complications associated to the procedure, such as failure to decrease pain; infection;  bleeding; organ or nerve damage with subsequent damage to sensory, motor, and/or autonomic systems, resulting in permanent pain, numbness, and/or weakness of one or several areas of the body; allergic reactions; (i.e.: anaphylactic reaction); and/or death. Furthermore, the patient was informed of those risks and complications associated with the medications. These include, but are not limited to: allergic reactions (i.e.: anaphylactic or anaphylactoid reaction(s)); adrenal axis suppression; blood sugar elevation that in diabetics may result in ketoacidosis or comma; water retention that in patients with history of congestive heart failure may result in shortness of breath, pulmonary edema, and decompensation with resultant heart failure; weight gain; swelling or edema; medication-induced neural toxicity; particulate matter embolism and blood vessel occlusion with resultant organ, and/or nervous system infarction; and/or aseptic necrosis of one or more joints. Finally, the patient was informed that Medicine is not an exact science; therefore, there is also the possibility of unforeseen or unpredictable risks and/or possible complications that may result in a catastrophic outcome. The patient indicated having understood very clearly. We have given the patient no guarantees and we have made no promises. Enough time was given to the patient to ask questions, all of which were answered to the patient's satisfaction. Candace Clayton has indicated that she wanted to continue with the procedure. Attestation: I, the ordering provider, attest that I  have discussed with the patient the benefits, risks, side-effects, alternatives, likelihood of achieving goals, and potential problems during recovery for the procedure that I have provided informed consent. Date  Time: 01/11/2022  1:35 PM  Pre-Procedure Preparation:  Monitoring: As per clinic protocol. Respiration, ETCO2, SpO2, BP, heart rate and rhythm monitor placed and checked for adequate function Safety Precautions: Patient was assessed for positional comfort and pressure points before starting the procedure. Time-out: I initiated and conducted the "Time-out" before starting the procedure, as per protocol. The patient was asked to participate by confirming the accuracy of the "Time Out" information. Verification of the correct person, site, and procedure were performed and confirmed by me, the nursing staff, and the patient. "Time-out" conducted as per Joint Commission's Universal Protocol (UP.01.01.01). Time: 1405  Description of Procedure:          Area Prepped: Entire       Lumbosacral Region DuraPrep (Iodine Povacrylex [0.7% available iodine] and Isopropyl Alcohol, 74% w/w) Safety Precautions: Aspiration looking for blood return was conducted prior to all injections. At no point did we inject any substances, as a needle was being advanced. No attempts were made at seeking any paresthesias. Safe injection practices and needle disposal techniques used. Medications properly checked for expiration dates. SDV (single dose vial) medications used. Description of the Procedure: Protocol guidelines were followed. The patient was placed in position over the fluoroscopy table. The target area was identified and the area prepped in the usual manner. Skin & deeper tissues infiltrated with local anesthetic. Appropriate amount of time allowed to pass for local anesthetics to take effect. The procedure needles were then advanced to the target area. Proper needle placement secured. Negative aspiration  confirmed. Solution injected in intermittent fashion, asking for systemic symptoms every 0.5cc of injectate. The needles were then removed and the area cleansed, making sure to leave some of the prepping solution back to take advantage of its long term bactericidal properties.  Vitals:   01/11/22 1342 01/11/22 1343  BP:  (!) 151/74  Pulse:  91  Resp:  18  Temp:  (!) 97 F (36.1 C)  SpO2:  98%  Weight: (!) 350 lb (  158.8 kg)   Height: '5\' 9"'$  (1.753 m)     Start Time: 1405 hrs. End Time: 1408 hrs. Materials:   Approximately 12 trigger points were injected with 0.5 to 1 cc of 0.2% ropivacaine with needling done.  Post-operative Assessment:  Post-procedure Vital Signs:  Pulse/HCG Rate: 91  Temp:  (!) 97 F (36.1 C) Resp: 18 BP:  (!) 151/74 SpO2: 98 %  EBL: None  Complications: No immediate post-treatment complications observed by team, or reported by patient.  Note: The patient tolerated the entire procedure well. A repeat set of vitals were taken after the procedure and the patient was kept under observation following institutional policy, for this type of procedure. Post-procedural neurological assessment was performed, showing return to baseline, prior to discharge. The patient was provided with post-procedure discharge instructions, including a section on how to identify potential problems. Should any problems arise concerning this procedure, the patient was given instructions to immediately contact us, at any time, without hesitation. In any case, we plan to contact the patient by telephone for a follow-up status report regarding this interventional procedure.  Comments:  No additional relevant information.  Plan of Care  Orders:  No orders of the defined types were placed in this encounter.  Chronic Opioid Analgesic:  Percocet 5 mg 3 times daily as needed, quantity 90/month; MME equals 22.5    Medications ordered for procedure: Meds ordered this encounter  Medications    ropivacaine (PF) 2 mg/mL (0.2%) (NAROPIN) injection 20 mL   Medications administered: We administered ropivacaine (PF) 2 mg/mL (0.2%).  See the medical record for exact dosing, route, and time of administration.  Follow-up plan:   Return in about 4 weeks (around 02/08/2022) for PP VV .       Status post diagnostic bilateral L3, L4, L5, S1 facet medial branch nerve blocks, status post right L3, L4, L5, S1 RFA on 05/05/2019, 04/15/20; left L3, L4, L5 RFA 05/03/2020.         Recent Visits Date Type Provider Dept  01/05/22 Office Visit Gillis Santa, MD [facility]  10/18/21 Office Visit Gillis Santa, MD [facility]  Showing recent visits within past 90 days and meeting all other requirements Today's Visits Date Type Provider Dept  01/11/22 Procedure visit Gillis Santa, MD [facility]  Showing today's visits and meeting all other requirements Future Appointments Date Type Provider Dept  02/06/22 Appointment Gillis Santa, MD [facility]  Showing future appointments within next 90 days and meeting all other requirements  Disposition: Discharge home  Discharge (Date  Time): 01/11/2022; 1415 hrs.   Primary Care Physician: Jonetta Osgood, NP Location: [facility] Note by: Gillis Santa, MD Date: 01/11/2022; Time: 3:52 PM  Disclaimer:  Medicine is not an exact science. The only guarantee in medicine is that nothing is guaranteed. It is important to note that the decision to proceed with this intervention was based on the information collected from the patient. The Data and conclusions were drawn from the patient's questionnaire, the interview, and the physical examination. Because the information was provided in large part by the patient, it cannot be guaranteed that it has not been purposely or unconsciously manipulated. Every effort has been made to obtain as much relevant data as possible for this evaluation. It is  important to note that the conclusions that lead to this procedure are derived in large part from the available data. Always take into account that the treatment will also be dependent  on availability of resources and existing treatment guidelines, considered by other Pain Management Practitioners as being common knowledge and practice, at the time of the intervention. For Medico-Legal purposes, it is also important to point out that variation in procedural techniques and pharmacological choices are the acceptable norm. The indications, contraindications, technique, and results of the above procedure should only be interpreted and judged by a Board-Certified Interventional Pain Specialist with extensive familiarity and expertise in the same exact procedure and technique.

## 2022-01-11 NOTE — Patient Instructions (Signed)

## 2022-01-11 NOTE — Progress Notes (Signed)
Safety precautions to be maintained throughout the outpatient stay will include: orient to surroundings, keep bed in low position, maintain call bell within reach at all times, provide assistance with transfer out of bed and ambulation.  

## 2022-01-12 ENCOUNTER — Telehealth: Payer: Self-pay

## 2022-01-12 NOTE — Telephone Encounter (Signed)
Post procedure phone call.  LM 

## 2022-01-14 ENCOUNTER — Encounter: Payer: Self-pay | Admitting: Nurse Practitioner

## 2022-01-16 ENCOUNTER — Ambulatory Visit (INDEPENDENT_AMBULATORY_CARE_PROVIDER_SITE_OTHER): Payer: No Typology Code available for payment source

## 2022-01-16 DIAGNOSIS — E538 Deficiency of other specified B group vitamins: Secondary | ICD-10-CM | POA: Diagnosis not present

## 2022-01-16 MED ORDER — CYANOCOBALAMIN 1000 MCG/ML IJ SOLN
1000.0000 ug | Freq: Once | INTRAMUSCULAR | Status: AC
  Administered 2022-01-16: 1000 ug via INTRAMUSCULAR

## 2022-01-17 ENCOUNTER — Ambulatory Visit: Payer: No Typology Code available for payment source

## 2022-01-17 ENCOUNTER — Telehealth: Payer: Self-pay

## 2022-01-17 NOTE — Telephone Encounter (Signed)
Patient left a vm stating her procedure on 7/19 didn't work and she wants to know if there is anything else he can do for her.

## 2022-01-18 ENCOUNTER — Ambulatory Visit: Payer: No Typology Code available for payment source

## 2022-01-23 ENCOUNTER — Other Ambulatory Visit: Payer: Self-pay | Admitting: Nurse Practitioner

## 2022-01-23 DIAGNOSIS — F411 Generalized anxiety disorder: Secondary | ICD-10-CM

## 2022-02-03 NOTE — Telephone Encounter (Signed)
2nd attempt to call patient for pre virtual appointment questions.  Message left to call office.

## 2022-02-06 ENCOUNTER — Encounter: Payer: Self-pay | Admitting: Student in an Organized Health Care Education/Training Program

## 2022-02-06 ENCOUNTER — Ambulatory Visit
Payer: No Typology Code available for payment source | Attending: Student in an Organized Health Care Education/Training Program | Admitting: Student in an Organized Health Care Education/Training Program

## 2022-02-06 DIAGNOSIS — S39012S Strain of muscle, fascia and tendon of lower back, sequela: Secondary | ICD-10-CM

## 2022-02-06 NOTE — Progress Notes (Signed)
I attempted to call the patient however no response. Voicemail left instructing patient to call front desk office at [phone] to reschedule appointment. -Dr Briton Sellman  

## 2022-02-12 ENCOUNTER — Encounter: Payer: Self-pay | Admitting: Nurse Practitioner

## 2022-02-16 ENCOUNTER — Other Ambulatory Visit: Payer: Self-pay | Admitting: Student in an Organized Health Care Education/Training Program

## 2022-02-16 DIAGNOSIS — G894 Chronic pain syndrome: Secondary | ICD-10-CM

## 2022-02-16 DIAGNOSIS — M5412 Radiculopathy, cervical region: Secondary | ICD-10-CM

## 2022-02-17 ENCOUNTER — Other Ambulatory Visit: Payer: No Typology Code available for payment source

## 2022-02-20 ENCOUNTER — Telehealth: Payer: Self-pay | Admitting: Student in an Organized Health Care Education/Training Program

## 2022-02-20 ENCOUNTER — Other Ambulatory Visit: Payer: Self-pay | Admitting: *Deleted

## 2022-02-20 DIAGNOSIS — M5412 Radiculopathy, cervical region: Secondary | ICD-10-CM

## 2022-02-20 DIAGNOSIS — G894 Chronic pain syndrome: Secondary | ICD-10-CM

## 2022-02-20 NOTE — Telephone Encounter (Signed)
Medication refill request sent for Lyrica

## 2022-02-20 NOTE — Telephone Encounter (Signed)
Patient says she is out of pregablin and needs script sent to pharmacy please.

## 2022-02-21 ENCOUNTER — Encounter: Payer: Self-pay | Admitting: Emergency Medicine

## 2022-02-21 ENCOUNTER — Ambulatory Visit
Admission: EM | Admit: 2022-02-21 | Discharge: 2022-02-21 | Disposition: A | Discharge status: Home / Self Care | Payer: No Typology Code available for payment source | Attending: Family Medicine | Admitting: Family Medicine

## 2022-02-21 ENCOUNTER — Other Ambulatory Visit: Payer: Self-pay

## 2022-02-21 DIAGNOSIS — G894 Chronic pain syndrome: Secondary | ICD-10-CM | POA: Insufficient documentation

## 2022-02-21 DIAGNOSIS — N3281 Overactive bladder: Secondary | ICD-10-CM | POA: Insufficient documentation

## 2022-02-21 DIAGNOSIS — F19939 Other psychoactive substance use, unspecified with withdrawal, unspecified: Secondary | ICD-10-CM | POA: Insufficient documentation

## 2022-02-21 DIAGNOSIS — Z91148 Patient's other noncompliance with medication regimen for other reason: Secondary | ICD-10-CM | POA: Insufficient documentation

## 2022-02-21 DIAGNOSIS — M5412 Radiculopathy, cervical region: Secondary | ICD-10-CM | POA: Diagnosis not present

## 2022-02-21 DIAGNOSIS — R1031 Right lower quadrant pain: Secondary | ICD-10-CM | POA: Diagnosis not present

## 2022-02-21 DIAGNOSIS — Z800 Family history of malignant neoplasm of digestive organs: Secondary | ICD-10-CM | POA: Insufficient documentation

## 2022-02-21 DIAGNOSIS — R112 Nausea with vomiting, unspecified: Secondary | ICD-10-CM | POA: Diagnosis not present

## 2022-02-21 DIAGNOSIS — N390 Urinary tract infection, site not specified: Secondary | ICD-10-CM | POA: Insufficient documentation

## 2022-02-21 DIAGNOSIS — M25551 Pain in right hip: Secondary | ICD-10-CM | POA: Diagnosis not present

## 2022-02-21 DIAGNOSIS — Z20822 Contact with and (suspected) exposure to covid-19: Secondary | ICD-10-CM | POA: Insufficient documentation

## 2022-02-21 DIAGNOSIS — Z9049 Acquired absence of other specified parts of digestive tract: Secondary | ICD-10-CM | POA: Insufficient documentation

## 2022-02-21 DIAGNOSIS — R197 Diarrhea, unspecified: Secondary | ICD-10-CM | POA: Diagnosis not present

## 2022-02-21 LAB — COMPREHENSIVE METABOLIC PANEL
ALT: 33 U/L (ref 0–44)
AST: 40 U/L (ref 15–41)
Albumin: 4.4 g/dL (ref 3.5–5.0)
Alkaline Phosphatase: 95 U/L (ref 38–126)
Anion gap: 9 (ref 5–15)
BUN: 14 mg/dL (ref 8–23)
CO2: 26 mmol/L (ref 22–32)
Calcium: 9.5 mg/dL (ref 8.9–10.3)
Chloride: 102 mmol/L (ref 98–111)
Creatinine, Ser: 0.76 mg/dL (ref 0.44–1.00)
GFR, Estimated: >60 mL/min (ref >60)
Glucose, Bld: 164 mg/dL — ABNORMAL HIGH (ref 70–99)
Potassium: 4 mmol/L (ref 3.5–5.1)
Sodium: 137 mmol/L (ref 135–145)
Total Bilirubin: 1 mg/dL (ref 0.3–1.2)
Total Protein: 8.1 g/dL (ref 6.5–8.1)

## 2022-02-21 LAB — CBC WITH DIFFERENTIAL/PLATELET
Abs Immature Granulocytes: 0.09 10*3/uL — ABNORMAL HIGH (ref 0.00–0.07)
Basophils Absolute: 0.1 10*3/uL (ref 0.0–0.1)
Basophils Relative: 1 %
Eosinophils Absolute: 0 10*3/uL (ref 0.0–0.5)
Eosinophils Relative: 0 %
HCT: 43.5 % (ref 36.0–46.0)
Hemoglobin: 14.4 g/dL (ref 12.0–15.0)
Immature Granulocytes: 1 %
Lymphocytes Relative: 19 %
Lymphs Abs: 2 10*3/uL (ref 0.7–4.0)
MCH: 27.5 pg (ref 26.0–34.0)
MCHC: 33.1 g/dL (ref 30.0–36.0)
MCV: 83.2 fL (ref 80.0–100.0)
Monocytes Absolute: 1 10*3/uL (ref 0.1–1.0)
Monocytes Relative: 10 %
Neutro Abs: 7.2 10*3/uL (ref 1.7–7.7)
Neutrophils Relative %: 69 %
Platelets: 306 10*3/uL (ref 150–400)
RBC: 5.23 MIL/uL — ABNORMAL HIGH (ref 3.87–5.11)
RDW: 14.3 % (ref 11.5–15.5)
WBC: 10.3 10*3/uL (ref 4.0–10.5)
nRBC: 0 % (ref 0.0–0.2)

## 2022-02-21 LAB — URINALYSIS, ROUTINE W REFLEX MICROSCOPIC
Glucose, UA: NEGATIVE mg/dL
Hgb urine dipstick: NEGATIVE
Ketones, ur: NEGATIVE mg/dL
Leukocytes,Ua: NEGATIVE
Nitrite: NEGATIVE
Protein, ur: 30 mg/dL — AB
Specific Gravity, Urine: 1.02 (ref 1.005–1.030)
pH: 5.5 (ref 5.0–8.0)

## 2022-02-21 LAB — URINALYSIS, MICROSCOPIC (REFLEX)

## 2022-02-21 LAB — SARS CORONAVIRUS 2 BY RT PCR: SARS Coronavirus 2 by RT PCR: NEGATIVE

## 2022-02-21 LAB — LIPASE, BLOOD: Lipase: 26 U/L (ref 11–51)

## 2022-02-21 MED ORDER — PROMETHAZINE HCL 25 MG/ML IJ SOLN
25.0000 mg | Freq: Once | INTRAMUSCULAR | Status: AC
  Administered 2022-02-21: 25 mg via INTRAMUSCULAR

## 2022-02-21 MED ORDER — ONDANSETRON HCL 4 MG PO TABS: 4.0000 mg | ORAL_TABLET | Freq: Four times a day (QID) | ORAL | 0 refills | Status: DC

## 2022-02-21 MED ORDER — PREGABALIN 100 MG PO CAPS: ORAL_CAPSULE | ORAL | 5 refills | Status: DC

## 2022-02-21 MED ORDER — PREGABALIN 100 MG PO CAPS: ORAL_CAPSULE | ORAL | 0 refills | Status: DC

## 2022-02-21 NOTE — ED Provider Notes (Signed)
[facility]    CSN: [id number] Arrival date & time: 02/21/22  1059      History   Chief Complaint Chief Complaint  Patient presents with   Weakness   Emesis   Diarrhea    HPI Candace Clayton is a 61 y.o. female.   HPI  Candace Clayton presents for diarrhea that started on Friday.  States that she vomits nearly every day for the past few months.  She has not spoken with her primary care doctor or seen a gastroenterologist.  Says her husband even has to meet her at her car daily when she gets up for work.  She is having some right lower abdominal pain.  Endorses dysuria.  Has not seen any blood in her urine.  Says she "stays in the bathroom more than she stays on the floor" and " I am still weak."   She has been out of her Lyrica for the past week.  She requested a refill from her doctor but it has not been prescribed yet.  She has chronic hip pain reports they gave her some injections in her right hip.   She had a gallbladder surgery where gallstones.  Her mom and brother both had colon cancer.  She had a colonoscopy a couple years ago. No blood in urine or stool.     Fever : no  Sore throat: no   Cough: no Rhinorrhea: no  Appetite: normal  Hydration: normal  Abdominal pain: yes Nausea: yes Vomiting: yes Dysuria: yes Sleep disturbance: yes Back Pain: yes Weakness: non-focal    Past Surgeries: cholecystectomy    Past Medical History:  Diagnosis Date   Anginal pain (HCC)    tightness related to anxiety   Anxiety    Cholecystolithiasis    Depression    Diabetes mellitus without complication (HCC)    GERD (gastroesophageal reflux disease)    throws up easily but not diagnosed with reflux   History of C-section 1992   Hypertension    Sleep apnea    uses cpap    Patient Active Problem List   Diagnosis Date Noted   Acute lumbar myofascial strain 01/05/2022   Cervicalgia 06/30/2021   Cervical radicular pain (right C5/6) 06/30/2021   Acute gallstone  pancreatitis 10/27/2020   Acute kidney injury ([place]) 10/27/2020   Encounter for general adult medical examination with abnormal findings 05/17/2020   Acute non-recurrent pansinusitis 05/17/2020   Vasomotor rhinitis 05/17/2020   Mixed hyperlipidemia 05/17/2020   Encounter for screening mammogram for malignant neoplasm of breast 05/17/2020   Arthritis of right knee 04/01/2020   Pneumonia of both lower lobes due to infectious organism 02/18/2020   Chest pain 02/13/2020   Shortness of breath 02/11/2020   Multiple closed fractures of ribs of right side 02/11/2020   Acute calculous cholecystitis 02/11/2020   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  01/27/2020   Multiple closed fractures of ribs of left side 01/27/2020   Overactive bladder 12/28/2019   Dysuria 10/05/2019   Type 2 diabetes mellitus with hyperglycemia, without long-term current use of insulin ([place]) 10/05/2019   Abnormal fasting glucose 10/05/2019   Bladder spasm 06/02/2019   Family history of colon cancer in mother    Right lower quadrant pain 03/12/2019   History of ovarian cyst 03/12/2019   Chronic pain syndrome 03/05/2019   Seasonal allergic rhinitis due to pollen 08/26/2018   Lumbar facet arthropathy (R>L) 08/22/2018   Lumbar spondylosis 08/22/2018   Left ovarian cyst 03/03/2018  Lumbar degenerative disc disease 12/23/2017   Encounter for screening colonoscopy 10/24/2017   Low back pain with sciatica 07/03/2017   Allergic rhinitis, unspecified 07/03/2017   Pain in unspecified knee 07/03/2017   OSA on CPAP 07/03/2017   Sedative, hypnotic, or anxiolytic use, unspecified, uncomplicated 07/04/3233   Hypersomnia 06/27/2017   Major depressive disorder, recurrent episode, moderate ([place]) 06/27/2017   Essential hypertension 06/27/2017   Morbid obesity ([place]) 06/27/2017   GAD (generalized anxiety disorder) 06/27/2017   Urinary tract infection 06/27/2017    Past Surgical History:  Procedure Laterality Date    [place]  10/26/2020   COLONOSCOPY     polyps removed first procedure. 2nd time all was clear   COLONOSCOPY WITH PROPOFOL N/A 03/14/2019   Procedure: COLONOSCOPY WITH PROPOFOL;  Surgeon: Lin Landsman, MD;  Location: [facility];  Service: Gastroenterology;  Laterality: N/A;   FINGER SURGERY Left 2011   left finger cut off x 2.(only up to last digit)   HEMATOMA EVACUATION Left 1999   upper part of foot was injured d/t 500lb weight landing on her foot.    LAPAROSCOPIC SALPINGO OOPHERECTOMY Left 04/02/2018   Procedure: LAPAROSCOPIC SALPINGO OOPHORECTOMY;  Surgeon: Malachy Mood, MD;  Location: [facility] ORS;  Service: Gynecology;  Laterality: Left;    OB History     Gravida  2   Para  2   Term  2   Preterm      AB      Living         SAB      IAB      Ectopic      Multiple      Live Births  2            Home Medications    Prior to Admission medications   Medication Sig Start Date End Date Taking? Authorizing Provider  albuterol (VENTOLIN HFA) 108 (90 Base) MCG/ACT inhaler TAKE 2 PUFFS BY MOUTH EVERY 6 HOURS AS NEEDED FOR WHEEZE OR SHORTNESS OF BREATH 11/28/21  Yes Abernathy, Alyssa, NP  amLODipine (NORVASC) 10 MG tablet Take 1 tablet (10 mg total) by mouth daily. 11/28/21  Yes Abernathy, Alyssa, NP  busPIRone (BUSPAR) 15 MG tablet TAKE 1 TABLET (15 MG TOTAL) BY MOUTH 3 (THREE) TIMES DAILY FOR ANXIETY. 01/24/22  Yes Lavera Guise, MD  chlorthalidone (HYGROTON) 25 MG tablet Take 1 tablet (25 mg total) by mouth daily. 11/28/21  Yes Abernathy, Yetta Flock, NP  desvenlafaxine (PRISTIQ) 100 MG 24 hr tablet Take 1 tablet (100 mg total) by mouth daily. 11/28/21  Yes Abernathy, Alyssa, NP  Dulaglutide (TRULICITY) 3 TD/3.2KG SOPN Inject 3 mg as directed once a week. 11/28/21  Yes Abernathy, Yetta Flock, NP  fluticasone (FLONASE) 50 MCG/ACT nasal spray Place 2 sprays into both nostrils daily. 11/28/21  Yes Abernathy, Yetta Flock, NP  hydrochlorothiazide  (HYDRODIURIL) 25 MG tablet Take 1 tablet (25 mg total) by mouth daily. 11/28/21  Yes Abernathy, Alyssa, NP  ipratropium-albuterol (DUONEB) 0.5-2.5 (3) MG/3ML SOLN Take 3 mLs by nebulization every 4 (four) hours as needed (shortness of breath or wheezing). 03/25/21  Yes Abernathy, Alyssa, NP  metFORMIN (GLUCOPHAGE) 500 MG tablet TAKE 1 TABLET BY MOUTH 2 TIMES DAILY WITH A MEAL. 11/27/21  Yes Abernathy, Alyssa, NP  methocarbamol (ROBAXIN) 750 MG tablet Take 1 tablet (750 mg total) by mouth every 8 (eight) hours as needed for muscle spasms. 01/05/22  Yes Gillis Santa, MD  metoprolol succinate (TOPROL-XL) 100 MG 24 hr  tablet Take 1 tablet (100 mg total) by mouth daily. TAKE WITH OR IMMEDIATELY FOLLOWING A MEAL. 11/28/21  Yes Abernathy, Alyssa, NP  NON FORMULARY cpap device   Yes [provider]  ondansetron (ZOFRAN) 4 MG tablet Take 1 tablet (4 mg total) by mouth every 6 (six) hours. 02/21/22  Yes Malayzia Laforte, DO  OVER THE COUNTER MEDICATION 1 tablet daily as needed. Takes generic stool softener for constipation d/t oxycodone.    Yes [provider]  oxyCODONE-acetaminophen (PERCOCET/ROXICET) 5-325 MG tablet Take 1 tablet by mouth every 8 (eight) hours as needed for severe pain. 01/30/22 03/01/22 Yes Gillis Santa, MD  oxyCODONE-acetaminophen (PERCOCET/ROXICET) 5-325 MG tablet Take 1 tablet by mouth every 8 (eight) hours as needed for severe pain. 03/01/22 03/31/22 Yes Gillis Santa, MD  oxyCODONE-acetaminophen (PERCOCET/ROXICET) 5-325 MG tablet Take 1 tablet by mouth every 8 (eight) hours as needed for severe pain. 03/31/22 04/30/22 Yes Gillis Santa, MD  rosuvastatin (CRESTOR) 5 MG tablet Take 1 tablet (5 mg total) by mouth daily. 11/28/21  Yes Abernathy, Alyssa, NP  Vitamin D, Ergocalciferol, (DRISDOL) 1.25 MG (50000 UNIT) CAPS capsule TAKE 1 CAPSULE BY MOUTH ONE TIME PER WEEK 10/06/21  Yes Abernathy, Alyssa, NP  Budeson-Glycopyrrol-Formoterol (BREZTRI AEROSPHERE) 160-9-4.8 MCG/ACT AERO Inhale 2 puffs  into the lungs 2 (two) times daily. 11/28/21   Abernathy, Yetta Flock, NP  glucose blood (CONTOUR NEXT TEST) test strip 1 each by Other route daily. DX E11.65 07/26/20   Luiz Ochoa, NP  Microlet Lancets MISC 1 each by Does not apply route daily. DX-E11.65 09/22/19   Ronnell Freshwater, NP  minoxidil (LONITEN) 10 MG tablet Take 1 tablet (10 mg total) by mouth daily. 11/28/21   Jonetta Osgood, NP  Multiple Vitamins-Calcium (ONE-A-DAY WOMENS PO) Take 1 tablet by mouth daily.    [provider]  oxyCODONE (OXY IR/ROXICODONE) 5 MG immediate release tablet Take by mouth. 10/29/20   [provider]  pregabalin (LYRICA) 100 MG capsule 100 mg qAM, 200 mg qhs 02/21/22   Maziah Smola, DO  metoprolol tartrate (LOPRESSOR) 25 MG tablet Take 2 tablets (50 mg total) by mouth 2 (two) times daily. 08/24/20 11/01/20  Luiz Ochoa, NP    Family History Family History  Problem Relation Age of Onset   Breast cancer Mother 73       x 3 times   Colon cancer Mother    Brain cancer Mother    Cancer - Colon Mother    Cancer - Other Mother    Heart Problems Father    Colon cancer Brother    Heart Problems Brother     Social History Social History   Tobacco Use   Smoking status: Never   Smokeless tobacco: Never  Vaping Use   Vaping Use: Never used  Substance Use Topics   Alcohol use: No   Drug use: No     Allergies   Patient has no known allergies.   Review of Systems Review of Systems : :negative unless otherwise stated in HPI.      Physical Exam Triage Vital Signs ED Triage Vitals  Enc Vitals Group     BP 02/21/22 1206 (!) 163/91     Pulse Rate 02/21/22 1206 89     Resp 02/21/22 1206 16     Temp 02/21/22 1206 98 F (36.7 C)     Temp Source 02/21/22 1206 Oral     SpO2 02/21/22 1206 100 %     Weight 02/21/22 1203 (!) 350  lb 1.5 oz (158.8 kg)     Height 02/21/22 1203 '5\' 9"'$  (1.753 m)     Head Circumference --      Peak Flow --      Pain Score 02/21/22 1201 0     Pain  Loc --      Pain Edu? --      Excl. in [place]? --    No data found.  Updated Vital Signs BP (!) 163/91 (BP Location: Left Arm)   Pulse 89   Temp 98 F (36.7 C) (Oral)   Resp 16   Ht '5\' 9"'$  (1.753 m)   Wt (!) 158.8 kg   SpO2 100%   BMI 51.70 kg/m   Visual Acuity Right Eye Distance:   Left Eye Distance:   Bilateral Distance:    Right Eye Near:   Left Eye Near:    Bilateral Near:     Physical Exam  GEN: alert, chronically ill-appearing obese female, in no acute distress    HENT:  mucus membranes moist EYES:   pupils equal and reactive, no jaundice NECK:  supple, normal ROM RESP:  clear to auscultation bilaterally, no increased work of breathing  CVS:   regular rate and rhythm ABD:  soft, obese abdomen, non-tender; bowel sounds present; no palpable masses, no rebounding, no guarding NEURO:  speech normal, alert and oriented   Skin:   warm and dry, normal skin turgor    UC Treatments / Results  Labs (all labs ordered are listed, but only abnormal results are displayed) Labs Reviewed  CBC WITH DIFFERENTIAL/PLATELET - Abnormal; Notable for the following components:      Result Value   RBC 5.23 (*)    Abs Immature Granulocytes 0.09 (*)    All other components within normal limits  COMPREHENSIVE METABOLIC PANEL - Abnormal; Notable for the following components:   Glucose, Bld 164 (*)    All other components within normal limits  URINALYSIS, ROUTINE W REFLEX MICROSCOPIC - Abnormal; Notable for the following components:   APPearance HAZY (*)    Bilirubin Urine SMALL (*)    Protein, ur 30 (*)    All other components within normal limits  URINALYSIS, MICROSCOPIC (REFLEX) - Abnormal; Notable for the following components:   Bacteria, UA MANY (*)    All other components within normal limits  SARS CORONAVIRUS 2 BY RT PCR  LIPASE, BLOOD    EKG   Radiology No results found.  Procedures Procedures (including critical care time)  Medications Ordered in UC Medications   promethazine (PHENERGAN) injection 25 mg (25 mg Intramuscular Given 02/21/22 1245)    Initial Impression / Assessment and Plan / UC Course  I have reviewed the triage vital signs and the nursing notes.  Pertinent labs & imaging results that were available during my care of the patient were reviewed by me and considered in my medical decision making (see chart for details).     Patient is a 61 year old female who presents for 4 days of diarrhea with ongoing vomiting and nausea.  Vital signs stable.  Of note, she has been out of her Lyrica for the past week.  Says that the vomiting has been going on for several months.  On chart review, he had hepatic steatosis on recent CT chest June 2023.  MR abdomen in June 2022 showed hepatic steatosis similar to CT from 2019 and gallstones.  Urinalysis not concerning for UTI.  CBC and CMP were grossly unremarkable.  Lipase was normal.  COVID  test was negative.  Her diarrhea and nausea could be related to acute withdrawal from Lyrica. One week supply sent to her pharmacy.  Advised her to call her PCP to get a refill.  Zofran sent to pharmacy.  Return and ED precautions provided.  Discussed MDM, treatment plan and plan for follow-up with patient/parent who agrees with plan.     Final Clinical Impressions(s) / UC Diagnoses   Final diagnoses:  Nausea vomiting and diarrhea  Withdrawal from other psychoactive substance [facility])     Discharge Instructions      Your symptoms are likely due to withdrawal from Lyrica.  A 7-day supply of this medication was sent to your pharmacy.  As you are vomiting has been ongoing for quite a while, we will give you some medication to help with that.  Zofran sent to your pharmacy. Be sure to follow-up with your primary care provider and/or gastroenterologist.     ED Prescriptions     Medication Sig Dispense Auth. Provider   pregabalin (LYRICA) 100 MG capsule 100 mg qAM, 200 mg qhs 21 capsule Diane Mochizuki, DO    ondansetron (ZOFRAN) 4 MG tablet Take 1 tablet (4 mg total) by mouth every 6 (six) hours. 12 tablet Okema Rollinson, DO      I have reviewed the PDMP during this encounter.   Lyndee Hensen, DO 02/21/22 1453

## 2022-02-21 NOTE — Discharge Instructions (Addendum)
Your symptoms are likely due to withdrawal from Lyrica.  A 7-day supply of this medication was sent to your pharmacy.  As you are vomiting has been ongoing for quite a while, we will give you some medication to help with that.  Zofran sent to your pharmacy. Be sure to follow-up with your primary care provider and/or gastroenterologist.

## 2022-02-21 NOTE — ED Triage Notes (Signed)
Pt c/o diarrhea, vomiting, and weakness. Started about 4 days ago. She states she has not had her lyrica since last Wednesday. Her pain management prescribe did not refill it for her. Pt states she was told he would today but she has not heard from them. Pt states she has been on lyrica for a long time.

## 2022-02-22 ENCOUNTER — Telehealth: Payer: No Typology Code available for payment source | Admitting: Nurse Practitioner

## 2022-02-28 ENCOUNTER — Ambulatory Visit: Payer: No Typology Code available for payment source | Admitting: Nurse Practitioner

## 2022-03-01 ENCOUNTER — Encounter: Payer: Self-pay | Admitting: Nurse Practitioner

## 2022-03-01 ENCOUNTER — Ambulatory Visit (INDEPENDENT_AMBULATORY_CARE_PROVIDER_SITE_OTHER): Payer: No Typology Code available for payment source | Admitting: Nurse Practitioner

## 2022-03-01 VITALS — BP 141/72 | HR 86 | Temp 98.4°F | Resp 16 | Ht 69.0 in | Wt 340.0 lb

## 2022-03-01 DIAGNOSIS — E782 Mixed hyperlipidemia: Secondary | ICD-10-CM

## 2022-03-01 DIAGNOSIS — Z23 Encounter for immunization: Secondary | ICD-10-CM

## 2022-03-01 DIAGNOSIS — Z760 Encounter for issue of repeat prescription: Secondary | ICD-10-CM | POA: Diagnosis not present

## 2022-03-01 DIAGNOSIS — I10 Essential (primary) hypertension: Secondary | ICD-10-CM

## 2022-03-01 DIAGNOSIS — F13939 Sedative, hypnotic or anxiolytic use, unspecified with withdrawal, unspecified: Secondary | ICD-10-CM

## 2022-03-01 DIAGNOSIS — E1165 Type 2 diabetes mellitus with hyperglycemia: Secondary | ICD-10-CM

## 2022-03-01 DIAGNOSIS — F331 Major depressive disorder, recurrent, moderate: Secondary | ICD-10-CM

## 2022-03-01 DIAGNOSIS — R600 Localized edema: Secondary | ICD-10-CM

## 2022-03-01 LAB — POCT GLYCOSYLATED HEMOGLOBIN (HGB A1C): Hemoglobin A1C: 7.7 % — AB (ref 4.0–5.6)

## 2022-03-01 MED ORDER — ROSUVASTATIN CALCIUM 5 MG PO TABS: 5.0000 mg | ORAL_TABLET | Freq: Every day | ORAL | 1 refills | Status: DC

## 2022-03-01 MED ORDER — TETANUS-DIPHTH-ACELL PERTUSSIS 5-2.5-18.5 LF-MCG/0.5 IM SUSP: 0.5000 mL | Freq: Once | INTRAMUSCULAR | 0 refills | Status: AC

## 2022-03-01 MED ORDER — ONDANSETRON 8 MG PO TBDP: 8.0000 mg | ORAL_TABLET | Freq: Three times a day (TID) | ORAL | 0 refills | Status: DC | PRN

## 2022-03-01 MED ORDER — DESVENLAFAXINE SUCCINATE ER 100 MG PO TB24: 100.0000 mg | ORAL_TABLET | Freq: Every day | ORAL | 1 refills | Status: DC

## 2022-03-01 MED ORDER — CHLORTHALIDONE 25 MG PO TABS: 25.0000 mg | ORAL_TABLET | Freq: Every day | ORAL | 2 refills | Status: DC

## 2022-03-01 MED ORDER — DULAGLUTIDE 4.5 MG/0.5ML ~~LOC~~ SOAJ: 4.5000 mg | SUBCUTANEOUS | 1 refills | Status: DC

## 2022-03-01 NOTE — Progress Notes (Signed)
[facility] [place], [place] [address]  Internal MEDICINE  Office Visit Note  Patient Name: Candace Clayton  [id number]  [id number]  Date of Service: 03/01/2022  Chief Complaint  Patient presents with   Depression   Diabetes   Gastroesophageal Reflux   Hypertension    HPI Candace Clayton presents for follow-up visit for chronic pain, neuropathy, diabetes and hypertension. --Ran out of Lyrica x6 days and restarted on 8/29.  Has been experiencing persistent nausea, vomiting, and diarrhea.  Additional symptoms reported include headaches, anxiety, irritability, sweating, palpitations, and mood swings. --A1c is 7.7, this is an increase from previous A1c.  Patient received a couple of cortisone injections for her back which did increase her glucose levels.  Lyrica - ran out for 6 days, restarted on 8/29 Sweating Residual symptoms can last several weeks.  Headaches Mood changes Agitation Depression Anxiety Confusion Suicidal thoughts Nausea Sweating Diarrhea Increased heart rate Cravings for the medication Insomnia Seizures      Current Medication: Outpatient Encounter Medications as of 03/01/2022  Medication Sig   albuterol (VENTOLIN HFA) 108 (90 Base) MCG/ACT inhaler TAKE 2 PUFFS BY MOUTH EVERY 6 HOURS AS NEEDED FOR WHEEZE OR SHORTNESS OF BREATH   amLODipine (NORVASC) 10 MG tablet Take 1 tablet (10 mg total) by mouth daily.   Budeson-Glycopyrrol-Formoterol (BREZTRI AEROSPHERE) 160-9-4.8 MCG/ACT AERO Inhale 2 puffs into the lungs 2 (two) times daily.   busPIRone (BUSPAR) 15 MG tablet TAKE 1 TABLET (15 MG TOTAL) BY MOUTH 3 (THREE) TIMES DAILY FOR ANXIETY.   Dulaglutide (TRULICITY) 3 JK/9.3OI SOPN Inject 3 mg as directed once a week.   Dulaglutide 4.5 MG/0.5ML SOPN Inject 4.5 mg into the skin once a week.   fluticasone (FLONASE) 50 MCG/ACT nasal spray Place 2 sprays into both nostrils daily.   glucose blood (CONTOUR NEXT TEST) test strip 1 each by Other  route daily. DX E11.65   hydrochlorothiazide (HYDRODIURIL) 25 MG tablet Take 1 tablet (25 mg total) by mouth daily.   ipratropium-albuterol (DUONEB) 0.5-2.5 (3) MG/3ML SOLN Take 3 mLs by nebulization every 4 (four) hours as needed (shortness of breath or wheezing).   metFORMIN (GLUCOPHAGE) 500 MG tablet TAKE 1 TABLET BY MOUTH 2 TIMES DAILY WITH A MEAL.   methocarbamol (ROBAXIN) 750 MG tablet Take 1 tablet (750 mg total) by mouth every 8 (eight) hours as needed for muscle spasms.   metoprolol succinate (TOPROL-XL) 100 MG 24 hr tablet Take 1 tablet (100 mg total) by mouth daily. TAKE WITH OR IMMEDIATELY FOLLOWING A MEAL.   Microlet Lancets MISC 1 each by Does not apply route daily. DX-E11.65   minoxidil (LONITEN) 10 MG tablet Take 1 tablet (10 mg total) by mouth daily.   Multiple Vitamins-Calcium (ONE-A-DAY WOMENS PO) Take 1 tablet by mouth daily.   NON FORMULARY cpap device   ondansetron (ZOFRAN-ODT) 8 MG disintegrating tablet Take 1 tablet (8 mg total) by mouth every 8 (eight) hours as needed for nausea or vomiting. Let the tablet dissolve in mouth, do not swallow whole.   OVER THE COUNTER MEDICATION 1 tablet daily as needed. Takes generic stool softener for constipation d/t oxycodone.    oxyCODONE (OXY IR/ROXICODONE) 5 MG immediate release tablet Take by mouth.   oxyCODONE-acetaminophen (PERCOCET/ROXICET) 5-325 MG tablet Take 1 tablet by mouth every 8 (eight) hours as needed for severe pain.   oxyCODONE-acetaminophen (PERCOCET/ROXICET) 5-325 MG tablet Take 1 tablet by mouth every 8 (eight) hours as needed for severe pain.   [START ON 03/31/2022]  oxyCODONE-acetaminophen (PERCOCET/ROXICET) 5-325 MG tablet Take 1 tablet by mouth every 8 (eight) hours as needed for severe pain.   pregabalin (LYRICA) 100 MG capsule 100 mg qAM, 200 mg qhs   Vitamin D, Ergocalciferol, (DRISDOL) 1.25 MG (50000 UNIT) CAPS capsule TAKE 1 CAPSULE BY MOUTH ONE TIME PER WEEK   [DISCONTINUED] chlorthalidone (HYGROTON) 25 MG  tablet Take 1 tablet (25 mg total) by mouth daily.   [DISCONTINUED] desvenlafaxine (PRISTIQ) 100 MG 24 hr tablet Take 1 tablet (100 mg total) by mouth daily.   [DISCONTINUED] ondansetron (ZOFRAN) 4 MG tablet Take 1 tablet (4 mg total) by mouth every 6 (six) hours.   [DISCONTINUED] rosuvastatin (CRESTOR) 5 MG tablet Take 1 tablet (5 mg total) by mouth daily.   [DISCONTINUED] Tdap (BOOSTRIX) 5-2.5-18.5 LF-MCG/0.5 injection Inject 0.5 mLs into the muscle once.   chlorthalidone (HYGROTON) 25 MG tablet Take 1 tablet (25 mg total) by mouth daily.   desvenlafaxine (PRISTIQ) 100 MG 24 hr tablet Take 1 tablet (100 mg total) by mouth daily.   rosuvastatin (CRESTOR) 5 MG tablet Take 1 tablet (5 mg total) by mouth daily.   Tdap (BOOSTRIX) 5-2.5-18.5 LF-MCG/0.5 injection Inject 0.5 mLs into the muscle once for 1 dose.   [DISCONTINUED] metoprolol tartrate (LOPRESSOR) 25 MG tablet Take 2 tablets (50 mg total) by mouth 2 (two) times daily.   No facility-administered encounter medications on file as of 03/01/2022.    Surgical History: Past Surgical History:  Procedure Laterality Date   CESAREAN SECTION  1992   CHOLECYSTECTOMY  10/26/2020   COLONOSCOPY     polyps removed first procedure. 2nd time all was clear   COLONOSCOPY WITH PROPOFOL N/A 03/14/2019   Procedure: COLONOSCOPY WITH PROPOFOL;  Surgeon: Lin Landsman, MD;  Location: [facility] ENDOSCOPY;  Service: Gastroenterology;  Laterality: N/A;   FINGER SURGERY Left 2011   left finger cut off x 2.(only up to last digit)   HEMATOMA EVACUATION Left 1999   upper part of foot was injured d/t 500lb weight landing on her foot.    LAPAROSCOPIC SALPINGO OOPHERECTOMY Left 04/02/2018   Procedure: LAPAROSCOPIC SALPINGO OOPHORECTOMY;  Surgeon: Malachy Mood, MD;  Location: [facility] ORS;  Service: Gynecology;  Laterality: Left;    Medical History: Past Medical History:  Diagnosis Date   Anginal pain (HCC)    tightness related to anxiety   Anxiety     Cholecystolithiasis    Depression    Diabetes mellitus without complication (HCC)    GERD (gastroesophageal reflux disease)    throws up easily but not diagnosed with reflux   History of C-section 1992   Hypertension    Sleep apnea    uses cpap    Family History: Family History  Problem Relation Age of Onset   Breast cancer Mother 6       x 3 times   Colon cancer Mother    Brain cancer Mother    Cancer - Colon Mother    Cancer - Other Mother    Heart Problems Father    Colon cancer Brother    Heart Problems Brother     Social History   Socioeconomic History   Marital status: Widowed    Spouse name: Not on file   Number of children: 1   Years of education: Not on file   Highest education level: Not on file  Occupational History   Occupation: works in Secretary/administrator  Tobacco Use   Smoking status: Never   Smokeless tobacco: Never  Vaping Use  Vaping Use: Never used  Substance and Sexual Activity   Alcohol use: No   Drug use: No   Sexual activity: Not Currently    Birth control/protection: Post-menopausal  Other Topics Concern   Not on file  Social History Narrative   Son has schizophrenia and autism but is fully capable of helping mother after surgery   Social Determinants of Health   Financial Resource Strain: Not on file  Food Insecurity: Not on file  Transportation Needs: Not on file  Physical Activity: Not on file  Stress: Not on file  Social Connections: Not on file  Intimate Partner Violence: Not on file      Review of Systems  Constitutional:  Positive for appetite change, diaphoresis and fatigue.  Respiratory:  Negative for cough, chest tightness, shortness of breath and wheezing.   Cardiovascular:  Positive for palpitations. Negative for chest pain.  Gastrointestinal:  Positive for abdominal pain, diarrhea, nausea and vomiting. Negative for constipation.  Musculoskeletal:  Positive for arthralgias, back pain, myalgias and neck pain.   Neurological:  Positive for dizziness and headaches.  Psychiatric/Behavioral:  Positive for confusion (Intermittent) and sleep disturbance. Negative for self-injury and suicidal ideas. The patient is nervous/anxious.     Vital Signs: BP (!) 141/72   Pulse 86   Temp 98.4 F (36.9 C)   Resp 16   Ht '5\' 9"'$  (1.753 m)   Wt (!) 340 lb (154.2 kg)   SpO2 94%   BMI 50.21 kg/m    Physical Exam Vitals reviewed.  Constitutional:      General: Candace Clayton is not in acute distress.    Appearance: Normal appearance. Candace Clayton is obese. Candace Clayton is ill-appearing and diaphoretic.  HENT:     Head: Normocephalic and atraumatic.  Eyes:     Pupils: Pupils are equal, round, and reactive to light.  Cardiovascular:     Rate and Rhythm: Regular rhythm. Tachycardia present.     Heart sounds: Normal heart sounds. No murmur heard. Pulmonary:     Effort: Pulmonary effort is normal. No respiratory distress.     Breath sounds: Normal breath sounds. No wheezing.  Lymphadenopathy:     Cervical: No cervical adenopathy.  Neurological:     Mental Status: Candace Clayton is alert and oriented to person, place, and time.  Psychiatric:        Mood and Affect: Mood normal.        Behavior: Behavior normal.        Assessment/Plan: 1. Type 2 diabetes mellitus with hyperglycemia, without long-term current use of insulin (HCC) A1c is elevated, most likely due to cortisone injections for back pain. Encouraged patient to continue diet and lifestyle modifications as discussed, continue current medications as prescribed as well. - POCT HgB A1C  2. Need for vaccination - Tdap ([place]) 5-2.5-18.5 LF-MCG/0.5 injection; Inject 0.5 mLs into the muscle once for 1 dose.  Dispense: 0.5 mL; Refill: 0  3. Medication refill - Dulaglutide 4.5 MG/0.5ML SOPN; Inject 4.5 mg into the skin once a week.  Dispense: 6 mL; Refill: 1 - chlorthalidone (HYGROTON) 25 MG tablet; Take 1 tablet (25 mg total) by mouth daily.  Dispense: 30 tablet; Refill: 2 -  desvenlafaxine (PRISTIQ) 100 MG 24 hr tablet; Take 1 tablet (100 mg total) by mouth daily.  Dispense: 90 tablet; Refill: 1 - rosuvastatin (CRESTOR) 5 MG tablet; Take 1 tablet (5 mg total) by mouth daily.  Dispense: 90 tablet; Refill: 1  4. 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 Candace Clayton has restarted the medication but residual symptoms can last for several weeks after stopping abruptly for a few days depending on the dose. Prn zofran prescribed for nausea and vomiting.    General Counseling: Candace Clayton verbalizes understanding of the findings of todays visit and agrees with plan of treatment. I have discussed any further diagnostic evaluation that may be needed or ordered today. We also reviewed her medications today. Candace Clayton has been encouraged to call the office with any questions or concerns that should arise related to todays visit.    Orders Placed This Encounter  Procedures   POCT HgB A1C    Meds ordered this encounter  Medications   Tdap (BOOSTRIX) 5-2.5-18.5 LF-MCG/0.5 injection    Sig: Inject 0.5 mLs into the muscle once for 1 dose.    Dispense:  0.5 mL    Refill:  0   ondansetron (ZOFRAN-ODT) 8 MG disintegrating tablet    Sig: Take 1 tablet (8 mg total) by mouth every 8 (eight) hours as needed for nausea or vomiting. Let the tablet dissolve in mouth, do not swallow whole.    Dispense:  20 tablet    Refill:  0    Discontinue previous orderes, fill new script asap thanks.   Dulaglutide 4.5 MG/0.5ML SOPN    Sig: Inject 4.5 mg into the skin once a week.    Dispense:  6 mL    Refill:  1    DOSE INCREASED; Discontinue 3 mg dose, please fill new 4.5 mg dose asap thanks   chlorthalidone (HYGROTON) 25 MG tablet    Sig: Take 1 tablet (25 mg total) by mouth daily.    Dispense:  30 tablet    Refill:  2   desvenlafaxine (PRISTIQ) 100 MG 24 hr tablet    Sig: Take 1 tablet (100 mg total) by mouth daily.    Dispense:   90 tablet    Refill:  1   rosuvastatin (CRESTOR) 5 MG tablet    Sig: Take 1 tablet (5 mg total) by mouth daily.    Dispense:  90 tablet    Refill:  1    Return for follow up in 2-3 weeks , Mareli Antunes PCP, med refill, Labs.   Total time spent:30 Minutes Time spent includes review of chart, medications, test results, and follow up plan with the patient.   [place] Controlled Substance Database was reviewed by me.  This patient was seen by Jonetta Osgood, FNP-C in collaboration with Dr. Clayborn Bigness as a part of collaborative care agreement.   Avnoor Koury R. Valetta Fuller, MSN, FNP-C Internal medicine

## 2022-03-15 ENCOUNTER — Encounter: Payer: Self-pay | Admitting: Nurse Practitioner

## 2022-03-15 ENCOUNTER — Ambulatory Visit (INDEPENDENT_AMBULATORY_CARE_PROVIDER_SITE_OTHER): Payer: No Typology Code available for payment source | Admitting: Nurse Practitioner

## 2022-03-15 VITALS — BP 133/69 | HR 84 | Temp 98.5°F | Resp 16 | Ht 69.0 in | Wt 337.8 lb

## 2022-03-15 DIAGNOSIS — L237 Allergic contact dermatitis due to plants, except food: Secondary | ICD-10-CM

## 2022-03-15 DIAGNOSIS — K5903 Drug induced constipation: Secondary | ICD-10-CM | POA: Diagnosis not present

## 2022-03-15 DIAGNOSIS — E1165 Type 2 diabetes mellitus with hyperglycemia: Secondary | ICD-10-CM

## 2022-03-15 DIAGNOSIS — R1114 Bilious vomiting: Secondary | ICD-10-CM | POA: Diagnosis not present

## 2022-03-15 DIAGNOSIS — Z23 Encounter for immunization: Secondary | ICD-10-CM

## 2022-03-15 MED ORDER — CLOBETASOL PROPIONATE 0.05 % EX OINT: 1.0000 | TOPICAL_OINTMENT | Freq: Every day | CUTANEOUS | 1 refills | Status: DC

## 2022-03-15 NOTE — Progress Notes (Signed)
[facility] [place], [place] [address]  Internal MEDICINE  Office Visit Note  Patient Name: Candace Clayton  [id number]  [id number]  Date of Service: 03/15/2022  Chief Complaint  Patient presents with   Follow-up   Depression   Diabetes   Gastroesophageal Reflux   Hypertension   Medication Reaction    Medication for nausea/vomiting is causing constipation   Rash    First noticed on Saturday, very itchy and burns    HPI Candace Clayton presents for a follow up visit for diabetes, hypertension, nausea/vomiting and a rash Nausea/vomiting -- taking zofran which is helping Constipation -- zofran is causing constipation  Rash on left leg --itchy and burning red rash spreading down left leg and on right knee, seems like scratching is helping the rash it spread. Unsure how it got there, denies contact with any plants or substances that could irritate the skin Request flu shot    Current Medication: Outpatient Encounter Medications as of 03/15/2022  Medication Sig   albuterol (VENTOLIN HFA) 108 (90 Base) MCG/ACT inhaler TAKE 2 PUFFS BY MOUTH EVERY 6 HOURS AS NEEDED FOR WHEEZE OR SHORTNESS OF BREATH   amLODipine (NORVASC) 10 MG tablet Take 1 tablet (10 mg total) by mouth daily.   Budeson-Glycopyrrol-Formoterol (BREZTRI AEROSPHERE) 160-9-4.8 MCG/ACT AERO Inhale 2 puffs into the lungs 2 (two) times daily.   busPIRone (BUSPAR) 15 MG tablet TAKE 1 TABLET (15 MG TOTAL) BY MOUTH 3 (THREE) TIMES DAILY FOR ANXIETY.   chlorthalidone (HYGROTON) 25 MG tablet Take 1 tablet (25 mg total) by mouth daily.   clobetasol ointment (TEMOVATE) 6.71 % Apply 1 Application topically daily. To affected area until resolved.   desvenlafaxine (PRISTIQ) 100 MG 24 hr tablet Take 1 tablet (100 mg total) by mouth daily.   Dulaglutide (TRULICITY) 3 IW/5.8KD SOPN Inject 3 mg as directed once a week.   Dulaglutide 4.5 MG/0.5ML SOPN Inject 4.5 mg into the skin once a week.   fluticasone (FLONASE) 50  MCG/ACT nasal spray Place 2 sprays into both nostrils daily.   glucose blood (CONTOUR NEXT TEST) test strip 1 each by Other route daily. DX E11.65   hydrochlorothiazide (HYDRODIURIL) 25 MG tablet Take 1 tablet (25 mg total) by mouth daily.   ipratropium-albuterol (DUONEB) 0.5-2.5 (3) MG/3ML SOLN Take 3 mLs by nebulization every 4 (four) hours as needed (shortness of breath or wheezing).   metFORMIN (GLUCOPHAGE) 500 MG tablet TAKE 1 TABLET BY MOUTH 2 TIMES DAILY WITH A MEAL.   methocarbamol (ROBAXIN) 750 MG tablet Take 1 tablet (750 mg total) by mouth every 8 (eight) hours as needed for muscle spasms.   metoprolol succinate (TOPROL-XL) 100 MG 24 hr tablet Take 1 tablet (100 mg total) by mouth daily. TAKE WITH OR IMMEDIATELY FOLLOWING A MEAL.   Microlet Lancets MISC 1 each by Does not apply route daily. DX-E11.65   minoxidil (LONITEN) 10 MG tablet Take 1 tablet (10 mg total) by mouth daily.   Multiple Vitamins-Calcium (ONE-A-DAY WOMENS PO) Take 1 tablet by mouth daily.   NON FORMULARY cpap device   ondansetron (ZOFRAN-ODT) 8 MG disintegrating tablet Take 1 tablet (8 mg total) by mouth every 8 (eight) hours as needed for nausea or vomiting. Let the tablet dissolve in mouth, do not swallow whole.   OVER THE COUNTER MEDICATION 1 tablet daily as needed. Takes generic stool softener for constipation d/t oxycodone.    oxyCODONE (OXY IR/ROXICODONE) 5 MG immediate release tablet Take by mouth.   oxyCODONE-acetaminophen (PERCOCET/ROXICET) 5-325  MG tablet Take 1 tablet by mouth every 8 (eight) hours as needed for severe pain.   [START ON 03/31/2022] oxyCODONE-acetaminophen (PERCOCET/ROXICET) 5-325 MG tablet Take 1 tablet by mouth every 8 (eight) hours as needed for severe pain.   pregabalin (LYRICA) 100 MG capsule 100 mg qAM, 200 mg qhs   rosuvastatin (CRESTOR) 5 MG tablet Take 1 tablet (5 mg total) by mouth daily.   Vitamin D, Ergocalciferol, (DRISDOL) 1.25 MG (50000 UNIT) CAPS capsule TAKE 1 CAPSULE BY MOUTH  ONE TIME PER WEEK   oxyCODONE-acetaminophen (PERCOCET/ROXICET) 5-325 MG tablet Take 1 tablet by mouth every 8 (eight) hours as needed for severe pain.   [DISCONTINUED] metoprolol tartrate (LOPRESSOR) 25 MG tablet Take 2 tablets (50 mg total) by mouth 2 (two) times daily.   No facility-administered encounter medications on file as of 03/15/2022.    Surgical History: Past Surgical History:  Procedure Laterality Date   CESAREAN SECTION  1992   CHOLECYSTECTOMY  10/26/2020   COLONOSCOPY     polyps removed first procedure. 2nd time all was clear   COLONOSCOPY WITH PROPOFOL N/A 03/14/2019   Procedure: COLONOSCOPY WITH PROPOFOL;  Surgeon: Lin Landsman, MD;  [facility] ENDOSCOPY;  Service: Gastroenterology;  Laterality: N/A;   FINGER SURGERY Left 2011   left finger cut off x 2.(only up to last digit)   HEMATOMA EVACUATION Left 1999   upper part of foot was injured d/t 500lb weight landing on her foot.    LAPAROSCOPIC SALPINGO OOPHERECTOMY Left 04/02/2018   Procedure: LAPAROSCOPIC SALPINGO OOPHORECTOMY;  Surgeon: Malachy Mood, MD;  Location: [facility] ORS;  Service: Gynecology;  Laterality: Left;    Medical History: Past Medical History:  Diagnosis Date   Anginal pain (HCC)    tightness related to anxiety   Anxiety    Cholecystolithiasis    Depression    Diabetes mellitus without complication (HCC)    GERD (gastroesophageal reflux disease)    throws up easily but not diagnosed with reflux   History of C-section 1992   Hypertension    Sleep apnea    uses cpap    Family History: Family History  Problem Relation Age of Onset   Breast cancer Mother 5       x 3 times   Colon cancer Mother    Brain cancer Mother    Cancer - Colon Mother    Cancer - Other Mother    Heart Problems Father    Colon cancer Brother    Heart Problems Brother     Social History   Socioeconomic History   Marital status: Widowed    Spouse name: Not on file   Number of children: 1   Years  of education: Not on file   Highest education level: Not on file  Occupational History   Occupation: works in Secretary/administrator  Tobacco Use   Smoking status: Never   Smokeless tobacco: Never  Vaping Use   Vaping Use: Never used  Substance and Sexual Activity   Alcohol use: No   Drug use: No   Sexual activity: Not Currently    Birth control/protection: Post-menopausal  Other Topics Concern   Not on file  Social History Narrative   Son has schizophrenia and autism but is fully capable of helping mother after surgery   Social Determinants of Health   Financial Resource Strain: Not on file  Food Insecurity: Not on file  Transportation Needs: Not on file  Physical Activity: Not on file  Stress: Not on  file  Social Connections: Not on file  Intimate Partner Violence: Not on file      Review of Systems  Respiratory: Negative.  Negative for cough, chest tightness, shortness of breath and wheezing.   Cardiovascular: Negative.  Negative for chest pain and palpitations.  Gastrointestinal:  Positive for constipation, nausea and vomiting.  Genitourinary: Negative.   Musculoskeletal:  Positive for arthralgias.  Skin:  Positive for rash.  Neurological: Negative.   Psychiatric/Behavioral:  Negative for self-injury, sleep disturbance and suicidal ideas. The patient is nervous/anxious.     Vital Signs: BP 133/69   Pulse 84   Temp 98.5 F (36.9 C)   Resp 16   Ht '5\' 9"'$  (1.753 m)   Wt (!) 337 lb 12.8 oz (153.2 kg)   SpO2 96%   BMI 49.88 kg/m    Physical Exam Vitals reviewed.  Constitutional:      General: She is not in acute distress.    Appearance: Normal appearance. She is obese. She is not ill-appearing.  HENT:     Head: Normocephalic and atraumatic.  Eyes:     Pupils: Pupils are equal, round, and reactive to light.  Cardiovascular:     Rate and Rhythm: Normal rate and regular rhythm.  Pulmonary:     Effort: Pulmonary effort is normal. No respiratory distress.   Skin:    Findings: Erythema and rash (itchy red maculopapular rash on left leg and right knee) present.  Neurological:     Mental Status: She is alert and oriented to person, place, and time.  Psychiatric:        Mood and Affect: Mood normal.        Behavior: Behavior normal.        Assessment/Plan: 1. Drug-induced constipation Due to zofran, discussed OTC options to help alleviate constipation including stool softeners and mild laxatives. If these OTC options do not help, a sample of linzess was given to the patient as a stronger option if still no relief.   2. Bilious vomiting with nausea Improved with zofran  3. Allergic contact dermatitis due to plants, except food Topical steroid prescribed, apply to affected area as prescribed until resolved. May take up to 2 weeks to completely resolve. Call the clinic if no improvement or worsening after the first week - clobetasol ointment (TEMOVATE) 0.05 %; Apply 1 Application topically daily. To affected area until resolved.  Dispense: 45 g; Refill: 1  4. Flu vaccine need Administered in office today - Flu Vaccine MDCK QUAD PF   General Counseling: Mikaelah verbalizes understanding of the findings of todays visit and agrees with plan of treatment. I have discussed any further diagnostic evaluation that may be needed or ordered today. We also reviewed her medications today. she has been encouraged to call the office with any questions or concerns that should arise related to todays visit.    Orders Placed This Encounter  Procedures   Flu Vaccine MDCK QUAD PF    Meds ordered this encounter  Medications   clobetasol ointment (TEMOVATE) 0.05 %    Sig: Apply 1 Application topically daily. To affected area until resolved.    Dispense:  45 g    Refill:  1    Return in about 11 weeks (around 05/31/2022) for F/U, Recheck A1C, Ramere Downs PCP.   Total time spent:30 Minutes Time spent includes review of chart, medications, test results, and  follow up plan with the patient.   [place] Controlled Substance Database was reviewed by me.  This patient  was seen by Jonetta Osgood, FNP-C in collaboration with Dr. Clayborn Bigness as a part of collaborative care agreement.   Terril Amaro R. Valetta Fuller, MSN, FNP-C Internal medicine

## 2022-03-30 ENCOUNTER — Other Ambulatory Visit: Payer: Self-pay | Admitting: Nurse Practitioner

## 2022-04-09 ENCOUNTER — Other Ambulatory Visit: Payer: Self-pay | Admitting: Nurse Practitioner

## 2022-04-09 DIAGNOSIS — E559 Vitamin D deficiency, unspecified: Secondary | ICD-10-CM

## 2022-04-10 NOTE — Telephone Encounter (Signed)
Pt can take OTC 1000IU

## 2022-04-18 ENCOUNTER — Encounter: Payer: Self-pay | Admitting: Student in an Organized Health Care Education/Training Program

## 2022-04-18 ENCOUNTER — Ambulatory Visit
Payer: No Typology Code available for payment source | Attending: Student in an Organized Health Care Education/Training Program | Admitting: Student in an Organized Health Care Education/Training Program

## 2022-04-18 VITALS — BP 149/76 | HR 73 | Temp 98.0°F | Resp 17 | Ht 69.0 in | Wt 325.0 lb

## 2022-04-18 DIAGNOSIS — G894 Chronic pain syndrome: Secondary | ICD-10-CM | POA: Diagnosis present

## 2022-04-18 DIAGNOSIS — M5412 Radiculopathy, cervical region: Secondary | ICD-10-CM | POA: Insufficient documentation

## 2022-04-18 DIAGNOSIS — M47816 Spondylosis without myelopathy or radiculopathy, lumbar region: Secondary | ICD-10-CM | POA: Insufficient documentation

## 2022-04-18 DIAGNOSIS — M5136 Other intervertebral disc degeneration, lumbar region: Secondary | ICD-10-CM | POA: Diagnosis present

## 2022-04-18 MED ORDER — OXYCODONE-ACETAMINOPHEN 5-325 MG PO TABS: 1.0000 | ORAL_TABLET | Freq: Three times a day (TID) | ORAL | 0 refills | Status: DC | PRN

## 2022-04-18 MED ORDER — PREGABALIN 100 MG PO CAPS: ORAL_CAPSULE | ORAL | 2 refills | Status: DC

## 2022-04-18 NOTE — Progress Notes (Signed)
PROVIDER NOTE: Information contained herein reflects review and annotations entered in association with encounter. Interpretation of such information and data should be left to medically-trained personnel. Information provided to patient can be located elsewhere in the medical record under "Patient Instructions". Document created using STT-dictation technology, any transcriptional errors that may result from process are unintentional.    Patient: Candace Clayton  Service Category: E/M  Provider: Gillis Santa, MD  DOB: [birth year]  DOS: 04/18/2022  Specialty: Interventional Pain Management  MRN: [id number]  Setting: Ambulatory outpatient  PCP: Jonetta Osgood, NP  Type: Established Patient    Referring Provider: Jonetta Osgood, NP  Location: Office  Delivery: Face-to-face     HPI  Candace Clayton, a 61 y.o. year old female, is here today because of her Lumbar degenerative disc disease [M51.36]. Candace Clayton primary complain today is Back Pain (lower)  Last encounter: My last encounter with her was on 02/06/22  Pertinent problems: Candace Clayton has Major depressive disorder, recurrent episode, moderate ([place]); Morbid obesity ([place]); Low back pain with sciatica; OSA on CPAP; Lumbar degenerative disc disease; Lumbar facet arthropathy (R>L); Lumbar spondylosis; Chronic pain syndrome; and Type 2 diabetes mellitus with hyperglycemia, without long-term current use of insulin (HCC) on their pertinent problem list. Pain Assessment: Severity of Chronic pain is reported as a 5 /10. Location: Back Lower, Right/down side of right leg to knee. Onset: More than a month ago. Quality: Burning. Timing: Constant. Modifying factor(s): procedures, meds. Vitals:  height is 5' 9"  (1.753 m) and weight is 325 lb (147.4 kg) (abnormal). Her temporal temperature is 98 F (36.7 C). Her blood pressure is 149/76 (abnormal) and her pulse is 73. Her respiration is 17 and oxygen saturation is 100%.   Reason for encounter:  medication management.    No change in medical history since last visit.  Patient's pain is at baseline.  Patient continues multimodal pain regimen as prescribed.  States that it provides pain relief and improvement in functional status. Is looking forward to retirement July 27, 2021.  We discussed weaning her Percocet at that time as the patient will be on her feet less often during the day and will be resting more.  Patient is in agreement with this plan.     Pharmacotherapy Assessment  Analgesic: Percocet 5 mg 3 times daily as needed, quantity 90/month; MME equals 22.5    Monitoring:  PMP: PDMP reviewed during this encounter.       Pharmacotherapy: No side-effects or adverse reactions reported. Compliance: No problems identified. Effectiveness: Clinically acceptable.  Rise Patience, RN  04/18/2022  1:03 PM  Sign when Signing Visit Nursing Pain Medication Assessment:  Safety precautions to be maintained throughout the outpatient stay will include: orient to surroundings, keep bed in low position, maintain call bell within reach at all times, provide assistance with transfer out of bed and ambulation.  Medication Inspection Compliance: Candace Clayton did not comply with our request to bring her pills to be counted. She was reminded that bringing the medication bottles, even when empty, is a requirement.  Medication: None brought in. Pill/Patch Count: None available to be counted. Bottle Appearance: No container available. Did not bring bottle(s) to appointment. Filled Date: N/A Last Medication intake:  Yesterday    UDS:  Summary  Date Value Ref Range Status  01/05/2022 Note  Final    Comment:    ==================================================================== [employer] (MW) ==================================================================== Test  Result       Flag       Units  Drug Present and Declared for Prescription Verification    Oxycodone                      3048         EXPECTED   ng/mg creat   Oxymorphone                    782          EXPECTED   ng/mg creat   Noroxycodone                   1433         EXPECTED   ng/mg creat   Noroxymorphone                 232          EXPECTED   ng/mg creat    Sources of oxycodone are scheduled prescription medications.    Oxymorphone, noroxycodone, and noroxymorphone are expected    metabolites of oxycodone. Oxymorphone is also available as a    scheduled prescription medication.  ==================================================================== Test                      Result    Flag   Units      Ref Range   Creatinine              66               mg/dL      >=20 ==================================================================== Declared Medications:  The flagging and interpretation on this report are based on the  following declared medications.  Unexpected results may arise from  inaccuracies in the declared medications.   **Note: The testing scope of this panel includes these medications:   Oxycodone (Percocet)   **Note: The testing scope of this panel does not include the  following reported medications:   Acetaminophen (Percocet)  Albuterol (Ventolin HFA)  Albuterol (Duoneb)  Amlodipine (Norvasc)  Budesonide (Breztri Aerosphere)  Buspirone (Buspar)  Chlorthalidone (Hygroton)  Desvenlafaxine (Pristiq)  Docusate (Stool Softener)  Dulaglutide (Trulicity)  Fluticasone (Flonase)  Formoterol (Breztri Aerosphere)  Glycopyrrolate (Breztri Aerosphere)  Hydrochlorothiazide (Hydrodiuril)  Ipratropium (Duoneb)  Metformin (Glucophage)  Methocarbamol (Robaxin)  Metoprolol (Toprol)  Minoxidil (Loniten)  Multivitamin  Pregabalin (Lyrica)  Rosuvastatin (Crestor)  Vitamin D2 (Drisdol) ==================================================================== For clinical consultation, please call [phone]. ====================================================================      ROS  Constitutional: Denies any fever or chills Gastrointestinal: No reported hemesis, hematochezia, vomiting, or acute GI distress Musculoskeletal:  Left low back pain  Neurological: No reported episodes of acute onset apraxia, aphasia, dysarthria, agnosia, amnesia, paralysis, loss of coordination, or loss of consciousness  Medication Review  Budeson-Glycopyrrol-Formoterol, Dulaglutide, Microlet Lancets, Multiple Vitamins-Minerals, OVER THE COUNTER MEDICATION, Vitamin D (Ergocalciferol), albuterol, amLODipine, busPIRone, chlorthalidone, clobetasol ointment, desvenlafaxine, fluticasone, glucose blood, hydrochlorothiazide, ipratropium-albuterol, metFORMIN, methocarbamol, metoprolol succinate, minoxidil, ondansetron, oxyCODONE, oxyCODONE-acetaminophen, pregabalin, and rosuvastatin  History Review  Allergy: Ms. Bryk has No Known Allergies. Drug: Candace Clayton  reports no history of drug use. Alcohol:  reports no history of alcohol use. Tobacco:  reports that she has never smoked. She has never used smokeless tobacco. Social: Candace Clayton  reports that she has never smoked. She has never used smokeless tobacco. She reports that she does not drink alcohol and does not use drugs. Medical:  has a past medical history  of Anginal pain ([place]), Anxiety, Cholecystolithiasis, Depression, Diabetes mellitus without complication ([place]), GERD (gastroesophageal reflux disease), History of C-section (1992), Hypertension, and Sleep apnea. Surgical: Candace Clayton  has a past surgical history that includes Cesarean section (1992); Finger surgery (Left, 2011); Hematoma evacuation (Left, 1999); Colonoscopy; Laparoscopic salpingo oophorectomy (Left, 04/02/2018); Colonoscopy with propofol (N/A, 03/14/2019); and Cholecystectomy (10/26/2020). Family: family history includes Brain cancer in her mother; Breast cancer (age of onset: 41) in her  mother; Cancer - Colon in her mother; Cancer - Other in her mother; Colon cancer in her brother and mother; Heart Problems in her brother and father.  Laboratory Chemistry Profile   Renal Lab Results  Component Value Date   BUN 14 02/21/2022   CREATININE 0.76 02/21/2022   BCR 16 12/02/2021   GFRAA 106 02/23/2020   GFRNONAA >60 02/21/2022    Hepatic Lab Results  Component Value Date   AST 40 02/21/2022   ALT 33 02/21/2022   ALBUMIN 4.4 02/21/2022   ALKPHOS 95 02/21/2022   LIPASE 26 02/21/2022    Electrolytes Lab Results  Component Value Date   NA 137 02/21/2022   K 4.0 02/21/2022   CL 102 02/21/2022   CALCIUM 9.5 02/21/2022    Bone Lab Results  Component Value Date   VD25OH 40.9 12/02/2021    Inflammation (CRP: Acute Phase) (ESR: Chronic Phase) Lab Results  Component Value Date   CRP 2 05/24/2021   ESRSEDRATE 12 05/24/2021          Note: Above Lab results reviewed.  Physical Exam  General appearance: Well nourished, well developed, and well hydrated. In no apparent acute distress Mental status: Alert, oriented x 3 (person, place, & time)       Respiratory: No evidence of acute respiratory distress Eyes: PERLA Vitals: BP (!) 149/76   Pulse 73   Temp 98 F (36.7 C) (Temporal)   Resp 17   Ht 5' 9"  (1.753 m)   Wt (!) 325 lb (147.4 kg)   SpO2 100%   BMI 47.99 kg/m  BMI: Estimated body mass index is 47.99 kg/m as calculated from the following:   Height as of this encounter: 5' 9"  (1.753 m).   Weight as of this encounter: 325 lb (147.4 kg). Ideal: Ideal body weight: 66.2 kg (145 lb 15.1 oz) Adjusted ideal body weight: 98.7 kg (217 lb 9.1 oz)  Lumbar Spine Area Exam  Skin & Axial Inspection: No masses, redness, or swelling Alignment: Symmetrical Functional ROM: Limited  Stability: No instability detected Muscle Tone/Strength: Functionally intact. No obvious neuro-muscular anomalies detected. Sensory (Neurological): facetogenic, musculoskeletal    Gait &  Posture Assessment  Ambulation: Unassisted Gait: Relatively normal for age and body habitus Posture: WNL    Lower Extremity Exam      Side: Right lower extremity   Side: Left lower extremity  Stability: No instability observed           Stability: No instability observed          Skin & Extremity Inspection: Skin color, temperature, and hair growth are WNL. No peripheral edema or cyanosis. No masses, redness, swelling, asymmetry, or associated skin lesions. No contractures.   Skin & Extremity Inspection: Skin color, temperature, and hair growth are WNL. No peripheral edema or cyanosis. No masses, redness, swelling, asymmetry, or associated skin lesions. No contractures.  Functional ROM: Pain restricted range of motion                Functional ROM: Unrestricted ROM  Muscle Tone/Strength: Functionally intact. No obvious neuro-muscular anomalies detected.   Muscle Tone/Strength: Functionally intact. No obvious neuro-muscular anomalies detected.  Sensory (Neurological): Arthropathic        Sensory (Neurological): Unimpaired        DTR: Patellar: deferred today Achilles: deferred today Plantar: deferred today   DTR: Patellar: deferred today Achilles: deferred today Plantar: deferred today  Palpation: No palpable anomalies   Palpation: No palpable anomalies    Assessment   Status Diagnosis  Having a Flare-up Persistent Controlled 1. Lumbar degenerative disc disease   2. Lumbar facet arthropathy    3. Cervical radicular pain (right C5/6)   4. Lumbar spondylosis   5. Chronic pain syndrome       Plan of Care   Candace Clayton has a current medication list which includes the following long-term medication(s): albuterol, amlodipine, chlorthalidone, desvenlafaxine, fluticasone, hydrochlorothiazide, ipratropium-albuterol, metformin, metoprolol succinate, minoxidil, rosuvastatin, oxycodone-acetaminophen, [START ON 04/30/2022] oxycodone-acetaminophen, [START ON 05/30/2022]  oxycodone-acetaminophen, [START ON 06/29/2022] oxycodone-acetaminophen, and pregabalin.  1. Lumbar degenerative disc disease - oxyCODONE-acetaminophen (PERCOCET/ROXICET) 5-325 MG tablet; Take 1 tablet by mouth every 8 (eight) hours as needed for severe pain.  Dispense: 90 tablet; Refill: 0 - oxyCODONE-acetaminophen (PERCOCET/ROXICET) 5-325 MG tablet; Take 1 tablet by mouth every 8 (eight) hours as needed for severe pain.  Dispense: 90 tablet; Refill: 0 - oxyCODONE-acetaminophen (PERCOCET/ROXICET) 5-325 MG tablet; Take 1 tablet by mouth every 8 (eight) hours as needed for severe pain.  Dispense: 90 tablet; Refill: 0 - pregabalin (LYRICA) 100 MG capsule; 100 mg qAM, 200 mg qhs  Dispense: 90 capsule; Refill: 2  2. Lumbar facet arthropathy  - oxyCODONE-acetaminophen (PERCOCET/ROXICET) 5-325 MG tablet; Take 1 tablet by mouth every 8 (eight) hours as needed for severe pain.  Dispense: 90 tablet; Refill: 0 - oxyCODONE-acetaminophen (PERCOCET/ROXICET) 5-325 MG tablet; Take 1 tablet by mouth every 8 (eight) hours as needed for severe pain.  Dispense: 90 tablet; Refill: 0 - oxyCODONE-acetaminophen (PERCOCET/ROXICET) 5-325 MG tablet; Take 1 tablet by mouth every 8 (eight) hours as needed for severe pain.  Dispense: 90 tablet; Refill: 0 - pregabalin (LYRICA) 100 MG capsule; 100 mg qAM, 200 mg qhs  Dispense: 90 capsule; Refill: 2  3. Cervical radicular pain (right C5/6) - pregabalin (LYRICA) 100 MG capsule; 100 mg qAM, 200 mg qhs  Dispense: 90 capsule; Refill: 2  4. Lumbar spondylosis - pregabalin (LYRICA) 100 MG capsule; 100 mg qAM, 200 mg qhs  Dispense: 90 capsule; Refill: 2  5. Chronic pain syndrome - oxyCODONE-acetaminophen (PERCOCET/ROXICET) 5-325 MG tablet; Take 1 tablet by mouth every 8 (eight) hours as needed for severe pain.  Dispense: 90 tablet; Refill: 0 - oxyCODONE-acetaminophen (PERCOCET/ROXICET) 5-325 MG tablet; Take 1 tablet by mouth every 8 (eight) hours as needed for severe pain.  Dispense:  90 tablet; Refill: 0 - oxyCODONE-acetaminophen (PERCOCET/ROXICET) 5-325 MG tablet; Take 1 tablet by mouth every 8 (eight) hours as needed for severe pain.  Dispense: 90 tablet; Refill: 0 - pregabalin (LYRICA) 100 MG capsule; 100 mg qAM, 200 mg qhs  Dispense: 90 capsule; Refill: 2 -Status post T1-T2 ESI 08/22/2021.  Repeat as needed.   Pharmacotherapy (Medications Ordered): Meds ordered this encounter  Medications   oxyCODONE-acetaminophen (PERCOCET/ROXICET) 5-325 MG tablet    Sig: Take 1 tablet by mouth every 8 (eight) hours as needed for severe pain.    Dispense:  90 tablet    Refill:  0    For chronic pain syndrome. To last for 30 days from fill date.  oxyCODONE-acetaminophen (PERCOCET/ROXICET) 5-325 MG tablet    Sig: Take 1 tablet by mouth every 8 (eight) hours as needed for severe pain.    Dispense:  90 tablet    Refill:  0    For chronic pain syndrome. To last for 30 days from fill date.   oxyCODONE-acetaminophen (PERCOCET/ROXICET) 5-325 MG tablet    Sig: Take 1 tablet by mouth every 8 (eight) hours as needed for severe pain.    Dispense:  90 tablet    Refill:  0    For chronic pain syndrome. To last for 30 days from fill date.   pregabalin (LYRICA) 100 MG capsule    Sig: 100 mg qAM, 200 mg qhs    Dispense:  90 capsule    Refill:  2    Fill one day early if pharmacy is closed on scheduled refill date. May substitute for generic if available.   Orders:  No orders of the defined types were placed in this encounter.    Follow-up plan:   Return in about 3 months (around 07/19/2022) for Medication Management, in person.     Status post diagnostic bilateral L3, L4, L5, S1 facet medial branch nerve blocks, status post right L3, L4, L5, S1 RFA on 05/05/2019, 04/15/20; left L3, L4, L5 RFA 05/03/2020.        Recent Visits No visits were found meeting these conditions. Showing recent visits within past 90 days and meeting all other requirements Today's Visits Date Type Provider Dept   04/18/22 Office Visit Gillis Santa, MD [facility]  Showing today's visits and meeting all other requirements Future Appointments Date Type Provider Dept  07/11/22 Appointment Gillis Santa, MD [facility]  Showing future appointments within next 90 days and meeting all other requirements  I discussed the assessment and treatment plan with the patient. The patient was provided an opportunity to ask questions and all were answered. The patient agreed with the plan and demonstrated an understanding of the instructions.  Patient advised to call back or seek an in-person evaluation if the symptoms or condition worsens.  Duration of encounter: 30 minutes.  Note by: Gillis Santa, MD Date: 04/18/2022; Time: 1:39 PM

## 2022-04-18 NOTE — Progress Notes (Signed)
Nursing Pain Medication Assessment:  Safety precautions to be maintained throughout the outpatient stay will include: orient to surroundings, keep bed in low position, maintain call bell within reach at all times, provide assistance with transfer out of bed and ambulation.  Medication Inspection Compliance: Ms. Charette did not comply with our request to bring her pills to be counted. She was reminded that bringing the medication bottles, even when empty, is a requirement.  Medication: None brought in. Pill/Patch Count: None available to be counted. Bottle Appearance: No container available. Did not bring bottle(s) to appointment. Filled Date: N/A Last Medication intake:  Yesterday

## 2022-04-29 ENCOUNTER — Encounter: Payer: Self-pay | Admitting: Nurse Practitioner

## 2022-05-06 ENCOUNTER — Encounter: Payer: Self-pay | Admitting: Nurse Practitioner

## 2022-05-14 ENCOUNTER — Other Ambulatory Visit: Payer: Self-pay | Admitting: Internal Medicine

## 2022-05-14 DIAGNOSIS — F411 Generalized anxiety disorder: Secondary | ICD-10-CM

## 2022-05-26 ENCOUNTER — Other Ambulatory Visit: Payer: Self-pay | Admitting: Nurse Practitioner

## 2022-05-26 DIAGNOSIS — E1165 Type 2 diabetes mellitus with hyperglycemia: Secondary | ICD-10-CM

## 2022-05-31 ENCOUNTER — Ambulatory Visit: Payer: No Typology Code available for payment source | Admitting: Nurse Practitioner

## 2022-06-01 ENCOUNTER — Ambulatory Visit (INDEPENDENT_AMBULATORY_CARE_PROVIDER_SITE_OTHER): Payer: No Typology Code available for payment source | Admitting: Nurse Practitioner

## 2022-06-01 ENCOUNTER — Encounter: Payer: Self-pay | Admitting: Nurse Practitioner

## 2022-06-01 ENCOUNTER — Ambulatory Visit
Admission: RE | Admit: 2022-06-01 | Discharge: 2022-06-01 | Disposition: A | Discharge status: Home / Self Care | Payer: No Typology Code available for payment source | Source: Ambulatory Visit | Attending: Nurse Practitioner | Admitting: Nurse Practitioner

## 2022-06-01 ENCOUNTER — Ambulatory Visit
Admission: RE | Admit: 2022-06-01 | Discharge: 2022-06-01 | Disposition: A | Discharge status: Home / Self Care | Payer: No Typology Code available for payment source | Attending: Nurse Practitioner | Admitting: Nurse Practitioner

## 2022-06-01 VITALS — BP 154/80 | HR 94 | Temp 97.9°F | Resp 16 | Ht 69.0 in | Wt 326.0 lb

## 2022-06-01 DIAGNOSIS — M5431 Sciatica, right side: Secondary | ICD-10-CM

## 2022-06-01 DIAGNOSIS — M25561 Pain in right knee: Secondary | ICD-10-CM

## 2022-06-01 NOTE — Progress Notes (Signed)
[facility] [place], [place] [address]  Internal MEDICINE  Office Visit Note  Patient Name: Candace Clayton  [id number]  [id number]  Date of Service: 06/01/2022  Chief Complaint  Patient presents with   Acute Visit    Burning and stinging on right side     HPI Candace Clayton presents for an acute sick visit for right knee pain Right knee gave out yesterday severe pain hard to walk on  --sciatica right side --chronic Right knee is difficult to bear weight on     Current Medication:  Outpatient Encounter Medications as of 06/01/2022  Medication Sig   albuterol (VENTOLIN HFA) 108 (90 Base) MCG/ACT inhaler TAKE 2 PUFFS BY MOUTH EVERY 6 HOURS AS NEEDED FOR WHEEZE OR SHORTNESS OF BREATH   amLODipine (NORVASC) 10 MG tablet Take 1 tablet (10 mg total) by mouth daily.   Budeson-Glycopyrrol-Formoterol (BREZTRI AEROSPHERE) 160-9-4.8 MCG/ACT AERO Inhale 2 puffs into the lungs 2 (two) times daily.   busPIRone (BUSPAR) 15 MG tablet TAKE 1 TABLET (15 MG TOTAL) BY MOUTH 3 (THREE) TIMES DAILY FOR ANXIETY.   chlorthalidone (HYGROTON) 25 MG tablet Take 1 tablet (25 mg total) by mouth daily.   clobetasol ointment (TEMOVATE) 1.88 % Apply 1 Application topically daily. To affected area until resolved.   desvenlafaxine (PRISTIQ) 100 MG 24 hr tablet Take 1 tablet (100 mg total) by mouth daily.   Dulaglutide (TRULICITY) 3 CZ/6.6AY SOPN Inject 3 mg as directed once a week.   Dulaglutide 4.5 MG/0.5ML SOPN Inject 4.5 mg into the skin once a week.   fluticasone (FLONASE) 50 MCG/ACT nasal spray Place 2 sprays into both nostrils daily.   glucose blood (CONTOUR NEXT TEST) test strip 1 each by Other route daily. DX E11.65   hydrochlorothiazide (HYDRODIURIL) 25 MG tablet Take 1 tablet (25 mg total) by mouth daily.   ipratropium-albuterol (DUONEB) 0.5-2.5 (3) MG/3ML SOLN Take 3 mLs by nebulization every 4 (four) hours as needed (shortness of breath or wheezing).   metFORMIN (GLUCOPHAGE) 500 MG  tablet TAKE 1 TABLET BY MOUTH TWICE A DAY WITH FOOD   methocarbamol (ROBAXIN) 750 MG tablet Take 1 tablet (750 mg total) by mouth every 8 (eight) hours as needed for muscle spasms.   metoprolol succinate (TOPROL-XL) 100 MG 24 hr tablet Take 1 tablet (100 mg total) by mouth daily. TAKE WITH OR IMMEDIATELY FOLLOWING A MEAL.   Microlet Lancets MISC 1 each by Does not apply route daily. DX-E11.65   minoxidil (LONITEN) 10 MG tablet Take 1 tablet (10 mg total) by mouth daily.   Multiple Vitamins-Calcium (ONE-A-DAY WOMENS PO) Take 1 tablet by mouth daily.   ondansetron (ZOFRAN-ODT) 8 MG disintegrating tablet TAKE 1 TABLET EVERY 8 HOURS AS NEEDED FOR NAUSEA OR VOMITING. LET THE TABLET DISSOLVE IN MOUTH   OVER THE COUNTER MEDICATION 1 tablet daily as needed. Takes generic stool softener for constipation d/t oxycodone.    oxyCODONE (OXY IR/ROXICODONE) 5 MG immediate release tablet Take by mouth.   oxyCODONE-acetaminophen (PERCOCET/ROXICET) 5-325 MG tablet Take 1 tablet by mouth every 8 (eight) hours as needed for severe pain.   [START ON 06/29/2022] oxyCODONE-acetaminophen (PERCOCET/ROXICET) 5-325 MG tablet Take 1 tablet by mouth every 8 (eight) hours as needed for severe pain.   pregabalin (LYRICA) 100 MG capsule 100 mg qAM, 200 mg qhs   rosuvastatin (CRESTOR) 5 MG tablet Take 1 tablet (5 mg total) by mouth daily.   Vitamin D, Ergocalciferol, (DRISDOL) 1.25 MG (50000 UNIT) CAPS capsule TAKE 1  CAPSULE BY MOUTH ONE TIME PER WEEK   oxyCODONE-acetaminophen (PERCOCET/ROXICET) 5-325 MG tablet Take 1 tablet by mouth every 8 (eight) hours as needed for severe pain.   oxyCODONE-acetaminophen (PERCOCET/ROXICET) 5-325 MG tablet Take 1 tablet by mouth every 8 (eight) hours as needed for severe pain.   No facility-administered encounter medications on file as of 06/01/2022.      Medical History: Past Medical History:  Diagnosis Date   Anginal pain ([place])    tightness related to anxiety   Anxiety     Cholecystolithiasis    Depression    Diabetes mellitus without complication (HCC)    GERD (gastroesophageal reflux disease)    throws up easily but not diagnosed with reflux   History of C-section 1992   Hypertension    Sleep apnea    uses cpap     Vital Signs: BP (!) 154/80   Pulse 94   Temp 97.9 F (36.6 C)   Resp 16   Ht '5\' 9"'$  (1.753 m)   Wt (!) 326 lb (147.9 kg)   SpO2 94%   BMI 48.14 kg/m    Review of Systems  Constitutional:  Negative for chills, fatigue and unexpected weight change.  HENT:  Negative for congestion, sneezing and sore throat.   Respiratory: Negative.  Negative for cough, chest tightness, shortness of breath and wheezing.   Cardiovascular: Negative.  Negative for chest pain and palpitations.  Gastrointestinal:  Negative for abdominal pain, constipation, diarrhea, nausea and vomiting.  Musculoskeletal:  Positive for arthralgias, back pain, gait problem and joint swelling (right knee). Negative for neck pain.  Skin:  Negative for rash.  Psychiatric/Behavioral:  Negative for behavioral problems (Depression), sleep disturbance and suicidal ideas. The patient is not nervous/anxious.     Physical Exam Vitals reviewed.  Constitutional:      General: She is not in acute distress.    Appearance: Normal appearance. She is obese. She is not ill-appearing.  HENT:     Head: Normocephalic and atraumatic.  Eyes:     Pupils: Pupils are equal, round, and reactive to light.  Cardiovascular:     Rate and Rhythm: Normal rate and regular rhythm.  Pulmonary:     Effort: Pulmonary effort is normal. No respiratory distress.  Musculoskeletal:     Right knee: Swelling, erythema and ecchymosis present. Decreased range of motion. Tenderness present over the medial joint line.     Left knee: Normal.  Neurological:     Mental Status: She is alert and oriented to person, place, and time.     Gait: Gait abnormal (limping).  Psychiatric:        Mood and Affect: Mood  normal.        Behavior: Behavior normal.       Assessment/Plan: 1. Acute pain of right knee Xray of right knee ordered to rule out acute injury.  - DG Knee Complete 4 Views Right; Future  2. Right sided sciatica Discussed stretches and nonpharmacologic interventions. Patient is already on pain medication and lyrica from Dr. Holley Raring. Handouts provided   General Counseling: Kaylei verbalizes understanding of the findings of todays visit and agrees with plan of treatment. I have discussed any further diagnostic evaluation that may be needed or ordered today. We also reviewed her medications today. she has been encouraged to call the office with any questions or concerns that should arise related to todays visit.    Counseling:    Orders Placed This Encounter  Procedures   DG Knee Complete  4 Views Right    No orders of the defined types were placed in this encounter.   Return in 3 weeks (on 06/22/2022) for previously scheduled, Angad Nabers PCP.  [place] Controlled Substance Database was reviewed by me for overdose risk score (ORS)  Time spent:20 Minutes Time spent with patient included reviewing progress notes, labs, imaging studies, and discussing plan for follow up.   This patient was seen by Jonetta Osgood, FNP-C in collaboration with Dr. Clayborn Bigness as a part of collaborative care agreement.  Latora Quarry R. Valetta Fuller, MSN, FNP-C Internal Medicine

## 2022-06-06 ENCOUNTER — Telehealth: Payer: Self-pay

## 2022-06-06 ENCOUNTER — Telehealth: Payer: Self-pay | Admitting: Nurse Practitioner

## 2022-06-06 NOTE — Telephone Encounter (Signed)
Received leave of absence form from [employer]. Gave to Alyssa to complete-Toni

## 2022-06-07 ENCOUNTER — Telehealth: Payer: Self-pay

## 2022-06-07 NOTE — Telephone Encounter (Signed)
Pt notified for result

## 2022-06-07 NOTE — Telephone Encounter (Signed)
Pt notified and she will go to [facility] for further evaluation

## 2022-06-07 NOTE — Progress Notes (Signed)
There is a small to moderate sided joint effusion of the right knee. This will gradually resolve but I can send her to ortho or she can call [facility] without a referral

## 2022-06-07 NOTE — Telephone Encounter (Signed)
-----   Message from Jonetta Osgood, NP sent at 06/07/2022  3:07 PM EST ----- There is a small to moderate sided joint effusion of the right knee. This will gradually resolve but I can send her to ortho or she can call [facility] without a referral

## 2022-06-10 ENCOUNTER — Other Ambulatory Visit: Payer: Self-pay | Admitting: Nurse Practitioner

## 2022-06-10 DIAGNOSIS — I10 Essential (primary) hypertension: Secondary | ICD-10-CM

## 2022-06-13 ENCOUNTER — Other Ambulatory Visit: Payer: Self-pay | Admitting: Nurse Practitioner

## 2022-06-13 DIAGNOSIS — E559 Vitamin D deficiency, unspecified: Secondary | ICD-10-CM

## 2022-06-14 ENCOUNTER — Encounter: Payer: Self-pay | Admitting: Student in an Organized Health Care Education/Training Program

## 2022-06-14 ENCOUNTER — Ambulatory Visit
Payer: No Typology Code available for payment source | Attending: Student in an Organized Health Care Education/Training Program | Admitting: Student in an Organized Health Care Education/Training Program

## 2022-06-14 VITALS — BP 149/75 | HR 83 | Temp 97.2°F | Resp 18 | Ht 69.0 in | Wt 325.0 lb

## 2022-06-14 DIAGNOSIS — M25561 Pain in right knee: Secondary | ICD-10-CM | POA: Insufficient documentation

## 2022-06-14 DIAGNOSIS — M1711 Unilateral primary osteoarthritis, right knee: Secondary | ICD-10-CM | POA: Diagnosis present

## 2022-06-14 DIAGNOSIS — G8929 Other chronic pain: Secondary | ICD-10-CM | POA: Insufficient documentation

## 2022-06-14 DIAGNOSIS — M5416 Radiculopathy, lumbar region: Secondary | ICD-10-CM | POA: Insufficient documentation

## 2022-06-14 NOTE — Patient Instructions (Signed)
GENERAL RISKS AND COMPLICATIONS ° °What are the risk, side effects and possible complications? °Generally speaking, most procedures are safe.  However, with any procedure there are risks, side effects, and the possibility of complications.  The risks and complications are dependent upon the sites that are lesioned, or the type of nerve block to be performed.  The closer the procedure is to the spine, the more serious the risks are.  Great care is taken when placing the radio frequency needles, block needles or lesioning probes, but sometimes complications can occur. °Infection: Any time there is an injection through the skin, there is a risk of infection.  This is why sterile conditions are used for these blocks.  There are four possible types of infection. °Localized skin infection. °Central Nervous System Infection-This can be in the form of Meningitis, which can be deadly. °Epidural Infections-This can be in the form of an epidural abscess, which can cause pressure inside of the spine, causing compression of the spinal cord with subsequent paralysis. This would require an emergency surgery to decompress, and there are no guarantees that the patient would recover from the paralysis. °Discitis-This is an infection of the intervertebral discs.  It occurs in about 1% of discography procedures.  It is difficult to treat and it may lead to surgery. ° °      2. Pain: the needles have to go through skin and soft tissues, will cause soreness. °      3. Damage to internal structures:  The nerves to be lesioned may be near blood vessels or   ° other nerves which can be potentially damaged. °      4. Bleeding: Bleeding is more common if the patient is taking blood thinners such as  aspirin, Coumadin, Ticiid, Plavix, etc., or if he/she have some genetic predisposition  such as hemophilia. Bleeding into the spinal canal can cause compression of the spinal  cord with subsequent paralysis.  This would require an emergency  surgery to  decompress and there are no guarantees that the patient would recover from the  paralysis. °      5. Pneumothorax:  Puncturing of a lung is a possibility, every time a needle is introduced in  the area of the chest or upper back.  Pneumothorax refers to free air around the  collapsed lung(s), inside of the thoracic cavity (chest cavity).  Another two possible  complications related to a similar event would include: Hemothorax and Chylothorax.   These are variations of the Pneumothorax, where instead of air around the collapsed  lung(s), you may have blood or chyle, respectively. °      6. Spinal headaches: They may occur with any procedures in the area of the spine. °      7. Persistent CSF (Cerebro-Spinal Fluid) leakage: This is a rare problem, but may occur  with prolonged intrathecal or epidural catheters either due to the formation of a fistulous  track or a dural tear. °      8. Nerve damage: By working so close to the spinal cord, there is always a possibility of  nerve damage, which could be as serious as a permanent spinal cord injury with  paralysis. °      9. Death:  Although rare, severe deadly allergic reactions known as "Anaphylactic  reaction" can occur to any of the medications used. °     10. Worsening of the symptoms:  We can always make thing worse. ° °What are the chances   of something like this happening? °Chances of any of this occuring are extremely low.  By statistics, you have more of a chance of getting killed in a motor vehicle accident: while driving to the hospital than any of the above occurring .  Nevertheless, you should be aware that they are possibilities.  In general, it is similar to taking a shower.  Everybody knows that you can slip, hit your head and get killed.  Does that mean that you should not shower again?  Nevertheless always keep in mind that statistics do not mean anything if you happen to be on the wrong side of them.  Even if a procedure has a 1 (one) in a  1,000,000 (million) chance of going wrong, it you happen to be that one..Also, keep in mind that by statistics, you have more of a chance of having something go wrong when taking medications. ° °Who should not have this procedure? °If you are on a blood thinning medication (e.g. Coumadin, Plavix, see list of "Blood Thinners"), or if you have an active infection going on, you should not have the procedure.  If you are taking any blood thinners, please inform your physician. ° °How should I prepare for this procedure? °Do not eat or drink anything at least six hours prior to the procedure. °Bring a driver with you .  It cannot be a taxi. °Come accompanied by an adult that can drive you back, and that is strong enough to help you if your legs get weak or numb from the local anesthetic. °Take all of your medicines the morning of the procedure with just enough water to swallow them. °If you have diabetes, make sure that you are scheduled to have your procedure done first thing in the morning, whenever possible. °If you have diabetes, take only half of your insulin dose and notify our nurse that you have done so as soon as you arrive at the clinic. °If you are diabetic, but only take blood sugar pills (oral hypoglycemic), then do not take them on the morning of your procedure.  You may take them after you have had the procedure. °Do not take aspirin or any aspirin-containing medications, at least eleven (11) days prior to the procedure.  They may prolong bleeding. °Wear loose fitting clothing that may be easy to take off and that you would not mind if it got stained with Betadine or blood. °Do not wear any jewelry or perfume °Remove any nail coloring.  It will interfere with some of our monitoring equipment. ° °NOTE: Remember that this is not meant to be interpreted as a complete list of all possible complications.  Unforeseen problems may occur. ° °BLOOD THINNERS °The following drugs contain aspirin or other products,  which can cause increased bleeding during surgery and should not be taken for 2 weeks prior to and 1 week after surgery.  If you should need take something for relief of minor pain, you may take acetaminophen which is found in Tylenol,m Datril, Anacin-3 and Panadol. It is not blood thinner. The products listed below are.  Do not take any of the products listed below in addition to any listed on your instruction sheet. ° °A.P.C or A.P.C with Codeine Codeine Phosphate Capsules #3 Ibuprofen Ridaura  °ABC compound Congesprin Imuran rimadil  °Advil Cope Indocin Robaxisal  °Alka-Seltzer Effervescent Pain Reliever and Antacid Coricidin or Coricidin-D ° Indomethacin Rufen  °Alka-Seltzer plus Cold Medicine Cosprin Ketoprofen S-A-C Tablets  °Anacin Analgesic Tablets or Capsules Coumadin   Korlgesic Salflex  Anacin Extra Strength Analgesic tablets or capsules CP-2 Tablets Lanoril Salicylate  Anaprox Cuprimine Capsules Levenox Salocol  Anexsia-D Dalteparin Magan Salsalate  Anodynos Darvon compound Magnesium Salicylate Sine-off  Ansaid Dasin Capsules Magsal Sodium Salicylate  Anturane Depen Capsules Marnal Soma  APF Arthritis pain formula Dewitt's Pills Measurin Stanback  Argesic Dia-Gesic Meclofenamic Sulfinpyrazone  Arthritis Bayer Timed Release Aspirin Diclofenac Meclomen Sulindac  Arthritis pain formula Anacin Dicumarol Medipren Supac  Analgesic (Safety coated) Arthralgen Diffunasal Mefanamic Suprofen  Arthritis Strength Bufferin Dihydrocodeine Mepro Compound Suprol  Arthropan liquid Dopirydamole Methcarbomol with Aspirin Synalgos  ASA tablets/Enseals Disalcid Micrainin Tagament  Ascriptin Doan's Midol Talwin  Ascriptin A/D Dolene Mobidin Tanderil  Ascriptin Extra Strength Dolobid Moblgesic Ticlid  Ascriptin with Codeine Doloprin or Doloprin with Codeine Momentum Tolectin  Asperbuf Duoprin Mono-gesic Trendar  Aspergum Duradyne Motrin or Motrin IB Triminicin  Aspirin plain, buffered or enteric coated  Durasal Myochrisine Trigesic  Aspirin Suppositories Easprin Nalfon Trillsate  Aspirin with Codeine Ecotrin Regular or Extra Strength Naprosyn Uracel  Atromid-S Efficin Naproxen Ursinus  Auranofin Capsules Elmiron Neocylate Vanquish  Axotal Emagrin Norgesic Verin  Azathioprine Empirin or Empirin with Codeine Normiflo Vitamin E  Azolid Emprazil Nuprin Voltaren  Bayer Aspirin plain, buffered or children's or timed BC Tablets or powders Encaprin Orgaran Warfarin Sodium  Buff-a-Comp Enoxaparin Orudis Zorpin  Buff-a-Comp with Codeine Equegesic Os-Cal-Gesic   Buffaprin Excedrin plain, buffered or Extra Strength Oxalid   Bufferin Arthritis Strength Feldene Oxphenbutazone   Bufferin plain or Extra Strength Feldene Capsules Oxycodone with Aspirin   Bufferin with Codeine Fenoprofen Fenoprofen Pabalate or Pabalate-SF   Buffets II Flogesic Panagesic   Buffinol plain or Extra Strength Florinal or Florinal with Codeine Panwarfarin   Buf-Tabs Flurbiprofen Penicillamine   Butalbital Compound Four-way cold tablets Penicillin   Butazolidin Fragmin Pepto-Bismol   Carbenicillin Geminisyn Percodan   Carna Arthritis Reliever Geopen Persantine   Carprofen Gold's salt Persistin   Chloramphenicol Goody's Phenylbutazone   Chloromycetin Haltrain Piroxlcam   Clmetidine heparin Plaquenil   Cllnoril Hyco-pap Ponstel   Clofibrate Hydroxy chloroquine Propoxyphen         Before stopping any of these medications, be sure to consult the physician who ordered them.  Some, such as Coumadin (Warfarin) are ordered to prevent or treat serious conditions such as "deep thrombosis", "pumonary embolisms", and other heart problems.  The amount of time that you may need off of the medication may also vary with the medication and the reason for which you were taking it.  If you are taking any of these medications, please make sure you notify your pain physician before you undergo any procedures.         Selective Nerve Root  Block  Selective nerve root block is a procedure to inject a numbing medicine (anesthetic) into an area of the spine where a nerve leaves the spinal cord (nerve root). In most cases, a strong anti-inflammatory medicine (steroid) is also injected. This procedure is often done to help diagnose the source of pain in the back, leg, neck, or arm. The injection may temporarily reduce or stop pain in the part of the body that is supplied by the nerve. A nerve root block can be done in the upper (cervical), middle (thoracic), or lower (lumbar) spine. It can also be done at the base of the spine (sacrum). During the procedure, a long needle (spinal needle) will be inserted between the spinal bones (vertebrae) to inject medicine into the nerve root. Tell a health care  provider about: Any allergies you have. All medicines you are taking, including vitamins, herbs, eye drops, creams, and over-the-counter medicines. Any problems you or family members have had with anesthetic medicines. Any bleeding problems you have. Any surgeries you have. Any medical conditions you have. Whether you are pregnant or may be pregnant or you are breastfeeding. What are the risks? Generally, this is a safe procedure. However, problems may occur, including: Infection. Bleeding. Allergic reactions to medicines or dyes. Damage to nerves or blood vessels. Leaking of spinal fluid, causing a severe headache (spinal headache). A temporary increase in pain right after the procedure. Muscle weakness or a loss of movement or feeling (paralysis). Temporary nausea, diarrhea, or facial warmth and redness (flushing), if a steroid is injected. What happens before the procedure? When to stop eating and drinking Follow instructions from your health care provider about what you may eat and drink before your procedure. These may include: 8 hours before your procedure Stop eating most foods. Do not eat meat, fried foods, or fatty foods. Eat  only light foods, such as toast or crackers. All liquids are okay except energy drinks and alcohol. 6 hours before your procedure Stop eating. Drink only clear liquids, such as water, clear fruit juice, black coffee, plain tea, and sports drinks. Do not drink energy drinks or alcohol. 2 hours before your procedure Stop drinking all liquids. You may be allowed to take medicines with small sips of water. If you do not follow your health care provider's instructions, your procedure may be delayed or canceled. General instructions Ask your health care provider about: Changing or stopping your regular medicines. This is especially important if you are taking diabetes medicines or blood thinners. Taking medicines such as aspirin and ibuprofen. These medicines can thin your blood. Do not take these medicines unless your health care provider tells you to take them. Taking over-the-counter medicines, vitamins, herbs, and supplements. If you will be going home right after the procedure, plan to have a responsible adult: Take you home from the hospital or clinic. You will not be allowed to drive. Care for you for the time you are told. Ask your health care provider: How your injection site will be marked. What steps will be taken to help prevent infection. These steps may include: Removing hair at the injection site. Washing skin with a germ-killing soap. What happens during the procedure? Medicine will be injected to numb the skin over the area where the spinal injection will be given (local anesthetic). You may be given a medicine to help you relax (sedative). A spinal needle will be inserted through an opening (foramen) in the vertebrae to reach the nerve root (transforaminal injection). X-ray dye (contrast dye) will be injected into the nerve root area. X-rays (fluoroscopy) will be done to make sure the spinal needle is in the correct position. The contrast dye helps show your body structures  clearly in the X-ray images. Anesthetic will be injected into the nerve root. Steroid medicine may also be injected. You may be asked if you feel pain relief. The spinal needle will be removed. A bandage (dressing) will be placed over the injection site. The procedure may vary among health care providers and hospitals. What happens after the procedure? The injection may temporarily reduce or eliminate pain in the part of the body that is supplied by the nerve. Your health care provider may ask questions about your pain. Your blood pressure, heart rate, breathing rate, blood oxygen level, strength, and coordination  will be monitored for about 30 minutes. If you were given a sedative during the procedure, it can affect you for several hours. Do not drive or operate machinery until your health care provider says that it is safe. Do not drive if the injection causes numbness in a body part needed for driving. You may have a temporary increase in blood sugar due to the steroid. Summary Selective nerve root block is a procedure to inject a numbing medicine (anesthetic) into an area of the spine where a nerve leaves the spinal cord (nerve root). This procedure may help diagnose the source of pain in the back, leg, neck, or arm. Before the procedure, follow instructions from your health care provider about when to stop eating or drinking. This information is not intended to replace advice given to you by your health care provider. Make sure you discuss any questions you have with your health care provider. Document Revised: 02/04/2021 Document Reviewed: 02/04/2021 Elsevier Patient Education  [place]. Knee Injection A knee injection is a procedure to get medicine into your knee joint to relieve the pain, swelling, and stiffness of arthritis. Your health care provider uses a needle to inject medicine, which may also help to lubricate and cushion your knee joint. You may need more than one  injection. Tell a health care provider about: Any allergies you have. All medicines you are taking, including vitamins, herbs, eye drops, creams, and over-the-counter medicines. Any problems you or family members have had with anesthetic medicines. Any blood disorders you have. Any surgeries you have had. Any medical conditions you have. Whether you are pregnant or may be pregnant. What are the risks? Generally, this is a safe procedure. However, problems may occur, including: Infection. Bleeding. Symptoms that get worse. Damage to the area around your knee. Allergic reaction to any of the medicines. Skin reactions from repeated injections. What happens before the procedure? Ask your health care provider about: Changing or stopping your regular medicines. This is especially important if you are taking diabetes medicines or blood thinners. Taking medicines such as aspirin and ibuprofen. These medicines can thin your blood. Do not take these medicines unless your health care provider tells you to take them. Taking over-the-counter medicines, vitamins, herbs, and supplements. Plan to have a responsible adult take you home from the hospital or clinic. What happens during the procedure?  You will sit or lie down in a position for your knee to be treated. The skin over your kneecap will be cleaned with a germ-killing soap. You will be given a medicine that numbs the area (local anesthetic). You may feel some stinging. The medicine will be injected into your knee. The needle is carefully placed between your kneecap and your knee. The medicine is injected into the joint space. The needle will be removed at the end of the procedure. A bandage (dressing) may be placed over the injection site. The procedure may vary among health care providers and hospitals. What can I expect after the procedure? Your blood pressure, heart rate, breathing rate, and blood oxygen level will be monitored until you  leave the hospital or clinic. You may have to move your knee through its full range of motion. This helps to get all the medicine into your joint space. You will be watched to make sure that you do not have a reaction to the injected medicine. You may feel more pain, swelling, and warmth than you did before the injection. This reaction may last about 1-2  days. Follow these instructions at home: Medicines Take over-the-counter and prescription medicines only as told by your health care provider. Ask your health care provider if the medicine prescribed to you requires you to avoid driving or using machinery. Do not take medicines such as aspirin and ibuprofen unless your health care provider tells you to take them. Injection site care Follow instructions from your health care provider about: How to take care of your puncture site. When and how you should change your dressing. When you should remove your dressing. Check your injection area every day for signs of infection. Check for: More redness, swelling, or pain after 2 days. Fluid or blood. Pus or a bad smell. Warmth. Managing pain, stiffness, and swelling  If directed, put ice on the injection area. To do this: Put ice in a plastic bag. Place a towel between your skin and the bag. Leave the ice on for 20 minutes, 2-3 times per day. Remove the ice if your skin turns bright red. This is very important. If you cannot feel pain, heat, or cold, you have a greater risk of damage to the area. Do not apply heat to your knee. Raise (elevate) the injection area above the level of your heart while you are sitting or lying down. General instructions If you were given a dressing, keep it dry until your health care provider says it can be removed. Ask your health care provider when you can start showering or bathing. Avoid strenuous activities for as long as directed by your health care provider. Ask your health care provider when you can return to  your normal activities. Keep all follow-up visits. This is important. You may need more injections. Contact a health care provider if you have: A fever. Warmth in your injection area. Fluid, blood, or pus coming from your injection site. Symptoms at your injection site that last longer than 2 days after your procedure. Get help right away if: Your knee turns very red. Your knee becomes very swollen. Your knee is in severe pain. Summary A knee injection is a procedure to get medicine into your knee joint to relieve the pain, swelling, and stiffness of arthritis. A needle is carefully placed between your kneecap and your knee to inject medicine into the joint space. Before the procedure, ask your health care provider about changing or stopping your regular medicines, especially if you are taking diabetes medicines or blood thinners. Contact your health care provider if you have any problems or questions after your procedure. This information is not intended to replace advice given to you by your health care provider. Make sure you discuss any questions you have with your health care provider. Document Revised: 11/26/2019 Document Reviewed: 11/26/2019 Elsevier Patient Education  [place].

## 2022-06-14 NOTE — Progress Notes (Signed)
PROVIDER NOTE: Information contained herein reflects review and annotations entered in association with encounter. Interpretation of such information and data should be left to medically-trained personnel. Information provided to patient can be located elsewhere in the medical record under "Patient Instructions". Document created using STT-dictation technology, any transcriptional errors that may result from process are unintentional.    Patient: Candace Clayton  Service Category: E/M  Provider: Gillis Santa, MD  DOB: 1961-05-05  DOS: 06/14/2022  Referring Provider: Jonetta Osgood, NP  MRN: [id number]  Specialty: Interventional Pain Management  PCP: Jonetta Osgood, NP  Type: Established Patient  Setting: Ambulatory outpatient    Location: Office  Delivery: Face-to-face     HPI  [name], a 61 y.o. year old female, is here today because of her Primary osteoarthritis of right knee [M17.11]. [name] primary complain today is Knee Pain (right) Last encounter: My last encounter with her was on 04/18/2022. Pertinent problems: [name] has Major depressive disorder, recurrent episode, moderate ([place]); Morbid obesity ([place]); Low back pain with sciatica; OSA on CPAP; Lumbar degenerative disc disease; Lumbar facet arthropathy (R>L); Lumbar spondylosis; Chronic pain syndrome; and Type 2 diabetes mellitus with hyperglycemia, without long-term current use of insulin (HCC) on their pertinent problem list. Pain Assessment: Severity of Chronic pain is reported as a 7 /10. Location: Knee Right/"right hip  to right knee burns constantly". Onset: More than a month ago. Quality: Burning. Timing: Constant. Modifying factor(s): alternate heat and ice. Vitals:  height is _0  (1.753 m) and weight is 325 lb (147.4 kg) (abnormal). Her temporal temperature is 97.2 F (36.2 C) (abnormal). Her blood pressure is 149/75 (abnormal) and her pulse is 83. Her respiration is 18 and oxygen saturation is 99%.  BMI:  Estimated body mass index is 47.99 kg/m as calculated from the following:   Height as of this encounter: _1  (1.753 m).   Weight as of this encounter: 325 lb (147.4 kg).  Reason for encounter: patient-requested evaluation.   Increased right knee pain, worse with weightbearing.  Knee x-ray reveals patellofemoral syndrome and knee osteoarthritis most pronounced at the medial compartment.  I have offered patient right knee steroid injection Patient is also experiencing low back pain with radiation into right buttock and right lateral thigh related to L4 foraminal stenosis.,  Dermatomal pain pattern.  I have offered her a right L4, L5 transforaminal ESI.  Pharmacotherapy Assessment  Analgesic: Percocet 5 mg 3 times daily as needed, quantity 90/month; MME equals 22.5    Monitoring: [place] PMP: PDMP reviewed during this encounter.       Pharmacotherapy: No side-effects or adverse reactions reported. Compliance: No problems identified. Effectiveness: Clinically acceptable.  Rise Patience, RN  06/14/2022 12:59 PM  Sign when Signing Visit Safety precautions to be maintained throughout the outpatient stay will include: orient to surroundings, keep bed in low position, maintain call bell within reach at all times, provide assistance with transfer out of bed and ambulation.     No results found for: "CBDTHCR" No results found for: "D8THCCBX" No results found for: "D9THCCBX"  UDS:  Summary  Date Value Ref Range Status  01/05/2022 Note  Final    Comment:    ==================================================================== [facility] (MW) ==================================================================== Test                             Result       Flag       Units  Drug Present and Declared for Prescription Verification   Oxycodone                      3048         EXPECTED   ng/mg creat   Oxymorphone                    782          EXPECTED   ng/mg creat   Noroxycodone                    1433         EXPECTED   ng/mg creat   Noroxymorphone                 232          EXPECTED   ng/mg creat    Sources of oxycodone are scheduled prescription medications.    Oxymorphone, noroxycodone, and noroxymorphone are expected    metabolites of oxycodone. Oxymorphone is also available as a    scheduled prescription medication.  ==================================================================== Test                      Result    Flag   Units      Ref Range   Creatinine              66               mg/dL      >=20 ==================================================================== Declared Medications:  The flagging and interpretation on this report are based on the  following declared medications.  Unexpected results may arise from  inaccuracies in the declared medications.   **Note: The testing scope of this panel includes these medications:   Oxycodone (Percocet)   **Note: The testing scope of this panel does not include the  following reported medications:   Acetaminophen (Percocet)  Albuterol (Ventolin HFA)  Albuterol (Duoneb)  Amlodipine (Norvasc)  Budesonide (Breztri Aerosphere)  Buspirone (Buspar)  Chlorthalidone (Hygroton)  Desvenlafaxine (Pristiq)  Docusate (Stool Softener)  Dulaglutide (Trulicity)  Fluticasone (Flonase)  Formoterol (Breztri Aerosphere)  Glycopyrrolate (Breztri Aerosphere)  Hydrochlorothiazide (Hydrodiuril)  Ipratropium (Duoneb)  Metformin (Glucophage)  Methocarbamol (Robaxin)  Metoprolol (Toprol)  Minoxidil (Loniten)  Multivitamin  Pregabalin (Lyrica)  Rosuvastatin (Crestor)  Vitamin D2 (Drisdol) ==================================================================== For clinical consultation, please call [phone]. ====================================================================       ROS  Constitutional: Denies any fever or chills Gastrointestinal: No reported hemesis, hematochezia, vomiting, or acute GI  distress Musculoskeletal:  Low back pain with radiation into right hip and right lateral thigh.  Right knee pain worse with weightbearing Neurological: No reported episodes of acute onset apraxia, aphasia, dysarthria, agnosia, amnesia, paralysis, loss of coordination, or loss of consciousness  Medication Review  Budeson-Glycopyrrol-Formoterol, Dulaglutide, Microlet Lancets, Multiple Vitamins-Minerals, OVER THE COUNTER MEDICATION, Vitamin D (Ergocalciferol), albuterol, amLODipine, busPIRone, chlorthalidone, clobetasol ointment, desvenlafaxine, fluticasone, glucose blood, hydrochlorothiazide, ipratropium-albuterol, metFORMIN, methocarbamol, metoprolol succinate, minoxidil, ondansetron, oxyCODONE, oxyCODONE-acetaminophen, pregabalin, and rosuvastatin  History Review  Allergy: [name] has No Known Allergies. Drug: [name]  reports no history of drug use. Alcohol:  reports no history of alcohol use. Tobacco:  reports that she has never smoked. She has never used smokeless tobacco. Social: [name]  reports that she has never smoked. She has never used smokeless tobacco. She reports that she does not drink alcohol and does not use drugs. Medical:  has a past medical history of  Anginal pain ([place]), Anxiety, Cholecystolithiasis, Depression, Diabetes mellitus without complication ([place]), GERD (gastroesophageal reflux disease), History of C-section (1992), Hypertension, and Sleep apnea. Surgical: [name]  has a past surgical history that includes Cesarean section (1992); Finger surgery (Left, 2011); Hematoma evacuation (Left, 1999); Colonoscopy; Laparoscopic salpingo oophorectomy (Left, 04/02/2018); Colonoscopy with propofol (N/A, 03/14/2019); and Cholecystectomy (10/26/2020). Family: family history includes Brain cancer in her mother; Breast cancer (age of onset: 6) in her mother; Cancer - Colon in her mother; Cancer - Other in her mother; Colon cancer in her brother and mother; Heart Problems in  her brother and father.  Laboratory Chemistry Profile   Renal Lab Results  Component Value Date   BUN 14 02/21/2022   CREATININE 0.76 02/21/2022   BCR 16 12/02/2021   GFRAA 106 02/23/2020   GFRNONAA >60 02/21/2022    Hepatic Lab Results  Component Value Date   AST 40 02/21/2022   ALT 33 02/21/2022   ALBUMIN 4.4 02/21/2022   ALKPHOS 95 02/21/2022   LIPASE 26 02/21/2022    Electrolytes Lab Results  Component Value Date   NA 137 02/21/2022   K 4.0 02/21/2022   CL 102 02/21/2022   CALCIUM 9.5 02/21/2022    Bone Lab Results  Component Value Date   VD25OH 40.9 12/02/2021    Inflammation (CRP: Acute Phase) (ESR: Chronic Phase) Lab Results  Component Value Date   CRP 2 05/24/2021   ESRSEDRATE 12 05/24/2021         Note: Above Lab results reviewed.  Recent Imaging Review  DG Knee Complete 4 Views Right CLINICAL DATA:  Acute anterior right knee pain after her knee gave out on her yesterday.  EXAM: RIGHT KNEE - COMPLETE 4+ VIEW  COMPARISON:  None Available.  FINDINGS: Marked medial joint space narrowing and associated spur formation. Widening of the lateral joint space with moderate lateral spur formation. Marked patellofemoral degenerative changes. Small to moderate-sized effusion. No fracture or dislocation seen.  IMPRESSION: 1. No fracture. 2. Marked degenerative changes and small to moderate-sized effusion.  Electronically Signed   By: Claudie Revering M.D.   On: 06/02/2022 20:49 Note: Reviewed        Physical Exam  General appearance: Well nourished, well developed, and well hydrated. In no apparent acute distress Mental status: Alert, oriented x 3 (person, place, & time)       Respiratory: No evidence of acute respiratory distress Eyes: PERLA Vitals: BP (!) 149/75   Pulse 83   Temp (!) 97.2 F (36.2 C) (Temporal)   Resp 18   Ht _0  (1.753 m)   Wt (!) 325 lb (147.4 kg)   SpO2 99%   BMI 47.99 kg/m  BMI: Estimated body mass index is 47.99 kg/m  as calculated from the following:   Height as of this encounter: _1  (1.753 m).   Weight as of this encounter: 325 lb (147.4 kg). Ideal: Ideal body weight: 66.2 kg (145 lb 15.1 oz) Adjusted ideal body weight: 98.7 kg (217 lb 9.1 oz)  Lumbar Spine Area Exam  Skin & Axial Inspection: No masses, redness, or swelling Alignment: Symmetrical Functional ROM: Pain restricted ROM affecting primarily the right Stability: No instability detected Muscle Tone/Strength: Functionally intact. No obvious neuro-muscular anomalies detected. Sensory (Neurological): Dermatomal pain pattern right L4, L5 Palpation: No palpable anomalies       Provocative Tests: Hyperextension/rotation test: deferred today       Lumbar quadrant test (Kemp's test): (+) on the right for foraminal stenosis Lateral  bending test: (+) ipsilateral radicular pain, on the right. Positive for right-sided foraminal stenosis.  Gait & Posture Assessment  Ambulation: Unassisted Gait: Relatively normal for age and body habitus Posture: WNL  Lower Extremity Exam    Side: Right lower extremity  Side: Left lower extremity  Stability: No instability observed          Stability: No instability observed          Skin & Extremity Inspection: Skin color, temperature, and hair growth are WNL. No peripheral edema or cyanosis. No masses, redness, swelling, asymmetry, or associated skin lesions. No contractures.  Skin & Extremity Inspection: Skin color, temperature, and hair growth are WNL. No peripheral edema or cyanosis. No masses, redness, swelling, asymmetry, or associated skin lesions. No contractures.  Functional ROM: Pain restricted ROM for knee joint          Functional ROM: Unrestricted ROM                  Muscle Tone/Strength: Functionally intact. No obvious neuro-muscular anomalies detected.  Muscle Tone/Strength: Functionally intact. No obvious neuro-muscular anomalies detected.  Sensory (Neurological): Arthropathic arthralgia         Sensory (Neurological): Unimpaired        DTR: Patellar: deferred today Achilles: deferred today Plantar: deferred today  DTR: Patellar: deferred today Achilles: deferred today Plantar: deferred today  Palpation: No palpable anomalies  Palpation: No palpable anomalies    Assessment   Diagnosis Status  1. Primary osteoarthritis of right knee   2. Patellofemoral arthralgia of right knee   3. Chronic radicular lumbar pain (right)    Having a Flare-up Having a Flare-up Having a Flare-up   Updated Problems: Problem  Chronic radicular lumbar pain (right)  Primary Osteoarthritis of Right Knee  Patellofemoral Arthralgia of Right Knee    Plan of Care   1. Primary osteoarthritis of right knee - KNEE INJECTION; Future  2. Patellofemoral arthralgia of right knee - KNEE INJECTION; Future  3. Chronic radicular lumbar pain (right) - Lumbar Transforaminal Epidural; Future    Ms. ALAINA Candace Clayton has a current medication list which includes the following long-term medication(s): albuterol, amlodipine, chlorthalidone, desvenlafaxine, fluticasone, hydrochlorothiazide, ipratropium-albuterol, metformin, metoprolol succinate, minoxidil, pregabalin, rosuvastatin, oxycodone-acetaminophen, oxycodone-acetaminophen, oxycodone-acetaminophen, and [START ON 06/29/2022] oxycodone-acetaminophen.  Orders:  Orders Placed This Encounter  Procedures   Lumbar Transforaminal Epidural    Standing Status:   Future    Standing Expiration Date:   09/13/2022    Scheduling Instructions:     Laterality: RIGHT L4 and Right L5     Level(s): L4 and L5          Sedation: local     Timeframe: ASAP    Order Specific Question:   Where will this procedure be performed?    Answer:   [facility]   KNEE INJECTION    Local Anesthetic & Steroid injection.    Standing Status:   Future    Standing Expiration Date:   09/13/2022    Scheduling Instructions:     Side: RIGHT     Sedation: None     Timeframe: As  soon as schedule allows    Order Specific Question:   Where will this procedure be performed?    Answer:   [facility]   Follow-up plan:   Return in about 3 weeks (around 07/05/2022) for Right L4 and L5 TF ESI + right knee steroid , in clinic NS (block 40 mins).     Status  post diagnostic bilateral L3, L4, L5, S1 facet medial branch nerve blocks, status post right L3, L4, L5, S1 RFA on 05/05/2019, 04/15/20; left L3, L4, L5 RFA 05/03/2020.         Recent Visits Date Type Provider Dept  04/18/22 Office Visit Gillis Santa, MD [facility]  Showing recent visits within past 90 days and meeting all other requirements Today's Visits Date Type Provider Dept  06/14/22 Office Visit Gillis Santa, MD [facility]  Showing today's visits and meeting all other requirements Future Appointments Date Type Provider Dept  07/05/22 Appointment Gillis Santa, MD [facility]  07/11/22 Appointment Gillis Santa, MD [facility]  Showing future appointments within next 90 days and meeting all other requirements  I discussed the assessment and treatment plan with the patient. The patient was provided an opportunity to ask questions and all were answered. The patient agreed with the plan and demonstrated an understanding of the instructions.  Patient advised to call back or seek an in-person evaluation if the symptoms or condition worsens.  Duration of encounter: 36mnutes.  Total time on encounter, as per AMA guidelines included both the face-to-face and non-face-to-face time personally spent by the physician and/or other qualified health care professional(s) on the day of the encounter (includes time in activities that require the physician or other qualified health care professional and does not include time in activities normally performed by clinical staff). Physician's time may include the following activities when performed: Preparing to see the patient (e.g.,  pre-charting review of records, searching for previously ordered imaging, lab work, and nerve conduction tests) Review of prior analgesic pharmacotherapies. Reviewing PMP Interpreting ordered tests (e.g., lab work, imaging, nerve conduction tests) Performing post-procedure evaluations, including interpretation of diagnostic procedures Obtaining and/or reviewing separately obtained history Performing a medically appropriate examination and/or evaluation Counseling and educating the patient/family/caregiver Ordering medications, tests, or procedures Referring and communicating with other health care professionals (when not separately reported) Documenting clinical information in the electronic or other health record Independently interpreting results (not separately reported) and communicating results to the patient/ family/caregiver Care coordination (not separately reported)  Note by: BGillis Santa MD Date: 06/14/2022; Time: 3:22 PM

## 2022-06-14 NOTE — Progress Notes (Signed)
Safety precautions to be maintained throughout the outpatient stay will include: orient to surroundings, keep bed in low position, maintain call bell within reach at all times, provide assistance with transfer out of bed and ambulation.  

## 2022-06-16 ENCOUNTER — Telehealth: Payer: Self-pay | Admitting: Nurse Practitioner

## 2022-06-16 NOTE — Telephone Encounter (Signed)
Disability form completed. Faxed back to [place]; [phone]

## 2022-06-21 ENCOUNTER — Ambulatory Visit: Payer: No Typology Code available for payment source | Admitting: Nurse Practitioner

## 2022-06-22 ENCOUNTER — Ambulatory Visit: Payer: No Typology Code available for payment source | Admitting: Nurse Practitioner

## 2022-06-26 ENCOUNTER — Other Ambulatory Visit: Payer: Self-pay | Admitting: Nurse Practitioner

## 2022-06-26 DIAGNOSIS — Z760 Encounter for issue of repeat prescription: Secondary | ICD-10-CM

## 2022-06-27 ENCOUNTER — Telehealth: Payer: Self-pay | Admitting: Nurse Practitioner

## 2022-06-27 NOTE — Telephone Encounter (Signed)
Received [employer] claim [employer] for Duty forms from [employer].& return to work form to be given back to patient. Gave to Alyssa to complete-Toni

## 2022-07-05 ENCOUNTER — Ambulatory Visit
Payer: No Typology Code available for payment source | Admitting: Student in an Organized Health Care Education/Training Program

## 2022-07-06 ENCOUNTER — Telehealth: Payer: Self-pay
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
## 2022-07-11 ENCOUNTER — Encounter: Payer: Self-pay | Admitting: Student in an Organized Health Care Education/Training Program

## 2022-07-11 ENCOUNTER — Other Ambulatory Visit: Payer: Self-pay | Admitting: Nurse Practitioner

## 2022-07-11 ENCOUNTER — Ambulatory Visit
Payer: No Typology Code available for payment source | Attending: Student in an Organized Health Care Education/Training Program | Admitting: Student in an Organized Health Care Education/Training Program

## 2022-07-11 VITALS — BP 162/59 | HR 74 | Temp 97.3°F | Ht 69.0 in | Wt 325.0 lb

## 2022-07-11 DIAGNOSIS — M1711 Unilateral primary osteoarthritis, right knee: Secondary | ICD-10-CM

## 2022-07-11 DIAGNOSIS — M5416 Radiculopathy, lumbar region: Secondary | ICD-10-CM | POA: Diagnosis present

## 2022-07-11 DIAGNOSIS — G894 Chronic pain syndrome: Secondary | ICD-10-CM | POA: Diagnosis present

## 2022-07-11 DIAGNOSIS — G8929 Other chronic pain: Secondary | ICD-10-CM | POA: Insufficient documentation

## 2022-07-11 DIAGNOSIS — M5136 Other intervertebral disc degeneration, lumbar region: Secondary | ICD-10-CM | POA: Diagnosis present

## 2022-07-11 DIAGNOSIS — M25561 Pain in right knee: Secondary | ICD-10-CM | POA: Diagnosis not present

## 2022-07-11 DIAGNOSIS — M5412 Radiculopathy, cervical region: Secondary | ICD-10-CM | POA: Diagnosis present

## 2022-07-11 DIAGNOSIS — M47816 Spondylosis without myelopathy or radiculopathy, lumbar region: Secondary | ICD-10-CM | POA: Insufficient documentation

## 2022-07-11 MED ORDER — OXYCODONE-ACETAMINOPHEN 5-325 MG PO TABS: 1.0000 | ORAL_TABLET | Freq: Three times a day (TID) | ORAL | 0 refills | Status: DC | PRN

## 2022-07-11 MED ORDER — PREGABALIN 100 MG PO CAPS: ORAL_CAPSULE | ORAL | 5 refills | Status: DC

## 2022-07-11 NOTE — Progress Notes (Signed)
PROVIDER NOTE: Information contained herein reflects review and annotations entered in association with encounter. Interpretation of such information and data should be left to medically-trained personnel. Information provided to patient can be located elsewhere in the medical record under "Patient Instructions". Document created using STT-dictation technology, any transcriptional errors that may result from process are unintentional.    Patient: Candace Clayton  Service Category: E/M  Provider: Gillis Santa, MD  DOB: May 03, 1961  DOS: 07/11/2022  Referring Provider: Jonetta Osgood, NP  MRN: [id number]  Specialty: Interventional Pain Management  PCP: Jonetta Osgood, NP  Type: Established Patient  Setting: Ambulatory outpatient    Location: Office  Delivery: Face-to-face     HPI  [name], a 62 y.o. year old female, is here today because of her Primary osteoarthritis of right knee [M17.11]. Ms. Wiedemann primary complain today is Knee Pain (right) Last encounter: My last encounter with her was on 06/14/22 Pertinent problems: Ms. Mendel has Major depressive disorder, recurrent episode, moderate ([place]); Morbid obesity ([place]); Low back pain with sciatica; OSA on CPAP; Lumbar degenerative disc disease; Lumbar facet arthropathy (R>L); Lumbar spondylosis; Chronic pain syndrome; and Type 2 diabetes mellitus with hyperglycemia, without long-term current use of insulin (HCC) on their pertinent problem list. Pain Assessment: Severity of Chronic pain is reported as a 8 /10. Location: Knee Right/pain radiaities down her right leg. Onset: More than a month ago. Quality: Aching, Burning, Constant, Throbbing, Shooting, Stabbing. Timing: Constant. Modifying factor(s): meds, laying down. Vitals:  height is '5\' 9"'$  (1.753 m) and weight is 325 lb (147.4 kg) (abnormal). Her temperature is 97.3 F (36.3 C) (abnormal). Her blood pressure is 162/59 (abnormal) and her pulse is 74. Her oxygen saturation is 99%.  BMI:  Estimated body mass index is 47.99 kg/m as calculated from the following:   Height as of this encounter: '5\' 9"'$  (1.753 m).   Weight as of this encounter: 325 lb (147.4 kg).  Reason for encounter: patient-requested evaluation.   Increased right knee pain, worse with weightbearing.  Knee x-ray reveals patellofemoral syndrome and knee osteoarthritis most pronounced at the medial compartment.  She has an upcoming appointment for knee injection later this month Patient is also experiencing low back pain with radiation into right buttock and right lateral thigh related to L4 foraminal stenosis.,  Dermatomal pain pattern.  She has an appointment later this month for a right L4, L5 transforaminal ESI. Refill of multimodal pain regimen as below.  Pharmacotherapy Assessment  Analgesic: Percocet 5 mg 3 times daily as needed, quantity 90/month; MME equals 22.5    Monitoring: [place] PMP: PDMP reviewed during this encounter.       Pharmacotherapy: No side-effects or adverse reactions reported. Compliance: No problems identified. Effectiveness: Clinically acceptable.  Chauncey Fischer, RN  07/11/2022  1:00 PM  Sign when Signing Visit Nursing Pain Medication Assessment:  Safety precautions to be maintained throughout the outpatient stay will include: orient to surroundings, keep bed in low position, maintain call bell within reach at all times, provide assistance with transfer out of bed and ambulation.  Medication Inspection Compliance: Pill count conducted under aseptic conditions, in front of the patient. Neither the pills nor the bottle was removed from the patient's sight at any time. Once count was completed pills were immediately returned to the patient in their original bottle.  Medication: See above Pill/Patch Count:  53 of 90 pills remain Pill/Patch Appearance: Markings consistent with prescribed medication Bottle Appearance: Standard pharmacy container. Clearly labeled. Filled Date: 53 /  5 /  2023 Last Medication intake:  TodaySafety precautions to be maintained throughout the outpatient stay will include: orient to surroundings, keep bed in low position, maintain call bell within reach at all times, provide assistance with transfer out of bed and ambulation.     No results found for: "CBDTHCR" No results found for: "D8THCCBX" No results found for: "D9THCCBX"  UDS:  Summary  Date Value Ref Range Status  01/05/2022 Note  Final    Comment:    ==================================================================== [facility] (MW) ==================================================================== Test                             Result       Flag       Units  Drug Present and Declared for Prescription Verification   Oxycodone                      3048         EXPECTED   ng/mg creat   Oxymorphone                    782          EXPECTED   ng/mg creat   Noroxycodone                   1433         EXPECTED   ng/mg creat   Noroxymorphone                 232          EXPECTED   ng/mg creat    Sources of oxycodone are scheduled prescription medications.    Oxymorphone, noroxycodone, and noroxymorphone are expected    metabolites of oxycodone. Oxymorphone is also available as a    scheduled prescription medication.  ==================================================================== Test                      Result    Flag   Units      Ref Range   Creatinine              66               mg/dL      >=20 ==================================================================== Declared Medications:  The flagging and interpretation on this report are based on the  following declared medications.  Unexpected results may arise from  inaccuracies in the declared medications.   **Note: The testing scope of this panel includes these medications:   Oxycodone (Percocet)   **Note: The testing scope of this panel does not include the  following reported medications:    Acetaminophen (Percocet)  Albuterol (Ventolin HFA)  Albuterol (Duoneb)  Amlodipine (Norvasc)  Budesonide (Breztri Aerosphere)  Buspirone (Buspar)  Chlorthalidone (Hygroton)  Desvenlafaxine (Pristiq)  Docusate (Stool Softener)  Dulaglutide (Trulicity)  Fluticasone (Flonase)  Formoterol (Breztri Aerosphere)  Glycopyrrolate (Breztri Aerosphere)  Hydrochlorothiazide (Hydrodiuril)  Ipratropium (Duoneb)  Metformin (Glucophage)  Methocarbamol (Robaxin)  Metoprolol (Toprol)  Minoxidil (Loniten)  Multivitamin  Pregabalin (Lyrica)  Rosuvastatin (Crestor)  Vitamin D2 (Drisdol) ==================================================================== For clinical consultation, please call [phone]. ====================================================================       ROS  Constitutional: Denies any fever or chills Gastrointestinal: No reported hemesis, hematochezia, vomiting, or acute GI distress Musculoskeletal:  Low back pain with radiation into right hip and right lateral thigh.  Right knee pain worse with weightbearing Neurological: No reported episodes of  acute onset apraxia, aphasia, dysarthria, agnosia, amnesia, paralysis, loss of coordination, or loss of consciousness  Medication Review  Budeson-Glycopyrrol-Formoterol, Dulaglutide, Microlet Lancets, Multiple Vitamins-Minerals, OVER THE COUNTER MEDICATION, Vitamin D (Ergocalciferol), albuterol, amLODipine, busPIRone, chlorthalidone, clobetasol ointment, desvenlafaxine, fluticasone, glucose blood, hydrochlorothiazide, ipratropium-albuterol, metFORMIN, methocarbamol, metoprolol succinate, minoxidil, ondansetron, oxyCODONE, oxyCODONE-acetaminophen, pregabalin, and rosuvastatin  History Review  Allergy: Ms. Kinn has No Known Allergies. Drug: Ms. Marchant  reports no history of drug use. Alcohol:  reports no history of alcohol use. Tobacco:  reports that she has never smoked. She has never used smokeless tobacco. Social: Ms.  Gorney  reports that she has never smoked. She has never used smokeless tobacco. She reports that she does not drink alcohol and does not use drugs. Medical:  has a past medical history of Anginal pain ([place]), Anxiety, Cholecystolithiasis, Depression, Diabetes mellitus without complication ([place]), GERD (gastroesophageal reflux disease), History of C-section (1992), Hypertension, and Sleep apnea. Surgical: Ms. Erney  has a past surgical history that includes Cesarean section (1992); Finger surgery (Left, 2011); Hematoma evacuation (Left, 1999); Colonoscopy; Laparoscopic salpingo oophorectomy (Left, 04/02/2018); Colonoscopy with propofol (N/A, 03/14/2019); and Cholecystectomy (10/26/2020). Family: family history includes Brain cancer in her mother; Breast cancer (age of onset: 42) in her mother; Cancer - Colon in her mother; Cancer - Other in her mother; Colon cancer in her brother and mother; Heart Problems in her brother and father.  Laboratory Chemistry Profile   Renal Lab Results  Component Value Date   BUN 14 02/21/2022   CREATININE 0.76 02/21/2022   BCR 16 12/02/2021   GFRAA 106 02/23/2020   GFRNONAA >60 02/21/2022    Hepatic Lab Results  Component Value Date   AST 40 02/21/2022   ALT 33 02/21/2022   ALBUMIN 4.4 02/21/2022   ALKPHOS 95 02/21/2022   LIPASE 26 02/21/2022    Electrolytes Lab Results  Component Value Date   NA 137 02/21/2022   K 4.0 02/21/2022   CL 102 02/21/2022   CALCIUM 9.5 02/21/2022    Bone Lab Results  Component Value Date   VD25OH 40.9 12/02/2021    Inflammation (CRP: Acute Phase) (ESR: Chronic Phase) Lab Results  Component Value Date   CRP 2 05/24/2021   ESRSEDRATE 12 05/24/2021         Note: Above Lab results reviewed.  Recent Imaging Review  DG Knee Complete 4 Views Right CLINICAL DATA:  Acute anterior right knee pain after her knee gave out on her yesterday.  EXAM: RIGHT KNEE - COMPLETE 4+ VIEW  COMPARISON:  None  Available.  FINDINGS: Marked medial joint space narrowing and associated spur formation. Widening of the lateral joint space with moderate lateral spur formation. Marked patellofemoral degenerative changes. Small to moderate-sized effusion. No fracture or dislocation seen.  IMPRESSION: 1. No fracture. 2. Marked degenerative changes and small to moderate-sized effusion.  Electronically Signed   By: Claudie Revering M.D.   On: 06/02/2022 20:49 Note: Reviewed        Physical Exam  General appearance: Well nourished, well developed, and well hydrated. In no apparent acute distress Mental status: Alert, oriented x 3 (person, place, & time)       Respiratory: No evidence of acute respiratory distress Eyes: PERLA Vitals: BP (!) 162/59   Pulse 74   Temp (!) 97.3 F (36.3 C)   Ht '5\' 9"'$  (1.753 m)   Wt (!) 325 lb (147.4 kg)   SpO2 99%   BMI 47.99 kg/m  BMI: Estimated body mass index is 47.99  kg/m as calculated from the following:   Height as of this encounter: '5\' 9"'$  (1.753 m).   Weight as of this encounter: 325 lb (147.4 kg). Ideal: Ideal body weight: 66.2 kg (145 lb 15.1 oz) Adjusted ideal body weight: 98.7 kg (217 lb 9.1 oz)  Lumbar Spine Area Exam  Skin & Axial Inspection: No masses, redness, or swelling Alignment: Symmetrical Functional ROM: Pain restricted ROM affecting primarily the right Stability: No instability detected Muscle Tone/Strength: Functionally intact. No obvious neuro-muscular anomalies detected. Sensory (Neurological): Dermatomal pain pattern right L4, L5 Palpation: No palpable anomalies       Provocative Tests: Hyperextension/rotation test: deferred today       Lumbar quadrant test (Kemp's test): (+) on the right for foraminal stenosis Lateral bending test: (+) ipsilateral radicular pain, on the right. Positive for right-sided foraminal stenosis.  Gait & Posture Assessment  Ambulation: Unassisted Gait: Relatively normal for age and body habitus Posture:  WNL  Lower Extremity Exam    Side: Right lower extremity  Side: Left lower extremity  Stability: No instability observed          Stability: No instability observed          Skin & Extremity Inspection: Skin color, temperature, and hair growth are WNL. No peripheral edema or cyanosis. No masses, redness, swelling, asymmetry, or associated skin lesions. No contractures.  Skin & Extremity Inspection: Skin color, temperature, and hair growth are WNL. No peripheral edema or cyanosis. No masses, redness, swelling, asymmetry, or associated skin lesions. No contractures.  Functional ROM: Pain restricted ROM for knee joint          Functional ROM: Unrestricted ROM                  Muscle Tone/Strength: Functionally intact. No obvious neuro-muscular anomalies detected.  Muscle Tone/Strength: Functionally intact. No obvious neuro-muscular anomalies detected.  Sensory (Neurological): Arthropathic arthralgia        Sensory (Neurological): Unimpaired        DTR: Patellar: deferred today Achilles: deferred today Plantar: deferred today  DTR: Patellar: deferred today Achilles: deferred today Plantar: deferred today  Palpation: No palpable anomalies  Palpation: No palpable anomalies    Assessment   Diagnosis Status  1. Primary osteoarthritis of right knee   2. Patellofemoral arthralgia of right knee   3. Chronic radicular lumbar pain (right)   4. Lumbar degenerative disc disease   5. Cervical radicular pain (right C5/6)   6. Lumbar facet arthropathy    7. Chronic pain syndrome   8. Lumbar spondylosis    Having a Flare-up Having a Flare-up Having a Flare-up   Updated Problems: No problems updated.   Plan of Care   1. Primary osteoarthritis of right knee -Keep appointment as scheduled for later this month for right knee injection  2. Patellofemoral arthralgia of right knee -Keep appointment as scheduled for later this month for right knee injection  3. Chronic radicular lumbar pain  (right) -Follow-up later this month for right lumbar transforaminal epidural steroid injection  4. Lumbar degenerative disc disease - oxyCODONE-acetaminophen (PERCOCET/ROXICET) 5-325 MG tablet; Take 1 tablet by mouth every 8 (eight) hours as needed for severe pain.  Dispense: 90 tablet; Refill: 0 - oxyCODONE-acetaminophen (PERCOCET/ROXICET) 5-325 MG tablet; Take 1 tablet by mouth every 8 (eight) hours as needed for severe pain.  Dispense: 90 tablet; Refill: 0 - oxyCODONE-acetaminophen (PERCOCET/ROXICET) 5-325 MG tablet; Take 1 tablet by mouth every 8 (eight) hours as needed for severe pain.  Dispense:  90 tablet; Refill: 0 - pregabalin (LYRICA) 100 MG capsule; 100 mg qAM, 200 mg qhs  Dispense: 90 capsule; Refill: 5  5. Cervical radicular pain (right C5/6) - pregabalin (LYRICA) 100 MG capsule; 100 mg qAM, 200 mg qhs  Dispense: 90 capsule; Refill: 5  6. Lumbar facet arthropathy  - oxyCODONE-acetaminophen (PERCOCET/ROXICET) 5-325 MG tablet; Take 1 tablet by mouth every 8 (eight) hours as needed for severe pain.  Dispense: 90 tablet; Refill: 0 - oxyCODONE-acetaminophen (PERCOCET/ROXICET) 5-325 MG tablet; Take 1 tablet by mouth every 8 (eight) hours as needed for severe pain.  Dispense: 90 tablet; Refill: 0 - oxyCODONE-acetaminophen (PERCOCET/ROXICET) 5-325 MG tablet; Take 1 tablet by mouth every 8 (eight) hours as needed for severe pain.  Dispense: 90 tablet; Refill: 0 - pregabalin (LYRICA) 100 MG capsule; 100 mg qAM, 200 mg qhs  Dispense: 90 capsule; Refill: 5  7. Chronic pain syndromeODONE-acetaminophen (PERCOCET/ROXICET) 5-325 MG tablet; Take 1 tablet by mouth every 8 (eight) hours as needed for severe pain.  Dispense: 90 tablet; Refill: 0 - oxyCODONE-acetaminophen (PERCOCET/ROXICET) 5-325 MG tablet; Take 1 tablet by mouth every 8 (eight) hours as needed for severe pain.  Dispense: 90 tablet; Refill: 0 - oxyCODONE-acetaminophen (PERCOCET/ROXICET) 5-325 MG tablet; Take 1 tablet by mouth every 8  (eight) hours as needed for severe pain.  Dispense: 90 tablet; Refill: 0 - pregabalin (LYRICA) 100 MG capsule; 100 mg qAM, 200 mg qhs  Dispense: 90 capsule; Refill: 5   Requested Prescriptions   Signed Prescriptions Disp Refills   oxyCODONE-acetaminophen (PERCOCET/ROXICET) 5-325 MG tablet 90 tablet 0    Sig: Take 1 tablet by mouth every 8 (eight) hours as needed for severe pain.   oxyCODONE-acetaminophen (PERCOCET/ROXICET) 5-325 MG tablet 90 tablet 0    Sig: Take 1 tablet by mouth every 8 (eight) hours as needed for severe pain.   oxyCODONE-acetaminophen (PERCOCET/ROXICET) 5-325 MG tablet 90 tablet 0    Sig: Take 1 tablet by mouth every 8 (eight) hours as needed for severe pain.   pregabalin (LYRICA) 100 MG capsule 90 capsule 5    Sig: 100 mg qAM, 200 mg qhs     Follow-up plan:   Return in about 4 months (around 10/26/2022) for Medication Management, in person.     Status post diagnostic bilateral L3, L4, L5, S1 facet medial branch nerve blocks, status post right L3, L4, L5, S1 RFA on 05/05/2019, 04/15/20; left L3, L4, L5 RFA 05/03/2020.         Recent Visits Date Type Provider Dept  06/14/22 Office Visit Gillis Santa, MD [facility]  04/18/22 Office Visit Gillis Santa, MD [facility]  Showing recent visits within past 90 days and meeting all other requirements Today's Visits Date Type Provider Dept  07/11/22 Office Visit Gillis Santa, MD [facility]  Showing today's visits and meeting all other requirements Future Appointments Date Type Provider Dept  07/26/22 Appointment Gillis Santa, MD [facility]  Showing future appointments within next 90 days and meeting all other requirements  I discussed the assessment and treatment plan with the patient. The patient was provided an opportunity to ask questions and all were answered. The patient agreed with the plan and demonstrated an understanding of the instructions.  Patient advised to call  back or seek an in-person evaluation if the symptoms or condition worsens.  Duration of encounter: 40mnutes.  Total time on encounter, as per AMA guidelines included both the face-to-face and non-face-to-face time personally spent by the physician and/or other  qualified health care professional(s) on the day of the encounter (includes time in activities that require the physician or other qualified health care professional and does not include time in activities normally performed by clinical staff). Physician's time may include the following activities when performed: Preparing to see the patient (e.g., pre-charting review of records, searching for previously ordered imaging, lab work, and nerve conduction tests) Review of prior analgesic pharmacotherapies. Reviewing PMP Interpreting ordered tests (e.g., lab work, imaging, nerve conduction tests) Performing post-procedure evaluations, including interpretation of diagnostic procedures Obtaining and/or reviewing separately obtained history Performing a medically appropriate examination and/or evaluation Counseling and educating the patient/family/caregiver Ordering medications, tests, or procedures Referring and communicating with other health care professionals (when not separately reported) Documenting clinical information in the electronic or other health record Independently interpreting results (not separately reported) and communicating results to the patient/ family/caregiver Care coordination (not separately reported)  Note by: Gillis Santa, MD Date: 07/11/2022; Time: 3:12 PM

## 2022-07-11 NOTE — Telephone Encounter (Signed)
Ok to send RX?

## 2022-07-11 NOTE — Progress Notes (Signed)
Nursing Pain Medication Assessment:  Safety precautions to be maintained throughout the outpatient stay will include: orient to surroundings, keep bed in low position, maintain call bell within reach at all times, provide assistance with transfer out of bed and ambulation.  Medication Inspection Compliance: Pill count conducted under aseptic conditions, in front of the patient. Neither the pills nor the bottle was removed from the patient's sight at any time. Once count was completed pills were immediately returned to the patient in their original bottle.  Medication: See above Pill/Patch Count:  53 of 90 pills remain Pill/Patch Appearance: Markings consistent with prescribed medication Bottle Appearance: Standard pharmacy container. Clearly labeled. Filled Date: 62 / 5 / 2023 Last Medication intake:  TodaySafety precautions to be maintained throughout the outpatient stay will include: orient to surroundings, keep bed in low position, maintain call bell within reach at all times, provide assistance with transfer out of bed and ambulation.

## 2022-07-13 ENCOUNTER — Encounter: Payer: Self-pay | Admitting: Nurse Practitioner

## 2022-07-13 ENCOUNTER — Ambulatory Visit: Payer: No Typology Code available for payment source | Admitting: Nurse Practitioner

## 2022-07-13 VITALS — BP 139/74 | HR 71 | Temp 97.3°F | Resp 16 | Wt 327.0 lb

## 2022-07-13 DIAGNOSIS — M25561 Pain in right knee: Secondary | ICD-10-CM

## 2022-07-13 DIAGNOSIS — M5431 Sciatica, right side: Secondary | ICD-10-CM | POA: Diagnosis not present

## 2022-07-13 DIAGNOSIS — R1114 Bilious vomiting: Secondary | ICD-10-CM | POA: Diagnosis not present

## 2022-07-13 MED ORDER — ONDANSETRON 8 MG PO TBDP: ORAL_TABLET | ORAL | 1 refills | Status: DC

## 2022-07-13 NOTE — Progress Notes (Signed)
[facility] [place],  [address]  Internal MEDICINE  Office Visit Note  Patient Name: Candace Clayton  [id number]  [id number]  Date of Service: 07/13/2022  Chief Complaint  Patient presents with   Follow-up    Right knee pain     HPI Ivis presents for a follow-up visit for right knee pain, back pain, and nausea and vomiting. Right knee pain -- going back to work after being out since 06/01/22 when her knee gave out. Still hurting but not as bad as it was initially Back pain -- Dr. Holley Raring plans to do an epidural injection in her lower back like he did for her neck.  Still having nausea and vomiting off and on -- may be a side effect of pain medication or other medication - need refills of zofran.     Current Medication: Outpatient Encounter Medications as of 07/13/2022  Medication Sig   albuterol (VENTOLIN HFA) 108 (90 Base) MCG/ACT inhaler TAKE 2 PUFFS BY MOUTH EVERY 6 HOURS AS NEEDED FOR WHEEZE OR SHORTNESS OF BREATH   amLODipine (NORVASC) 10 MG tablet TAKE 1 TABLET BY MOUTH EVERY DAY   Budeson-Glycopyrrol-Formoterol (BREZTRI AEROSPHERE) 160-9-4.8 MCG/ACT AERO Inhale 2 puffs into the lungs 2 (two) times daily.   busPIRone (BUSPAR) 15 MG tablet TAKE 1 TABLET (15 MG TOTAL) BY MOUTH 3 (THREE) TIMES DAILY FOR ANXIETY.   chlorthalidone (HYGROTON) 25 MG tablet TAKE 1 TABLET (25 MG TOTAL) BY MOUTH DAILY.   clobetasol ointment (TEMOVATE) 3.01 % Apply 1 Application topically daily. To affected area until resolved.   desvenlafaxine (PRISTIQ) 100 MG 24 hr tablet Take 1 tablet (100 mg total) by mouth daily.   Dulaglutide (TRULICITY) 3 SW/1.0XN SOPN Inject 3 mg as directed once a week.   Dulaglutide 4.5 MG/0.5ML SOPN Inject 4.5 mg into the skin once a week.   fluticasone (FLONASE) 50 MCG/ACT nasal spray Place 2 sprays into both nostrils daily.   glucose blood (CONTOUR NEXT TEST) test strip 1 each by Other route daily. DX E11.65   hydrochlorothiazide  (HYDRODIURIL) 25 MG tablet Take 1 tablet (25 mg total) by mouth daily.   ipratropium-albuterol (DUONEB) 0.5-2.5 (3) MG/3ML SOLN Take 3 mLs by nebulization every 4 (four) hours as needed (shortness of breath or wheezing).   metFORMIN (GLUCOPHAGE) 500 MG tablet TAKE 1 TABLET BY MOUTH TWICE A DAY WITH FOOD   methocarbamol (ROBAXIN) 750 MG tablet Take 1 tablet (750 mg total) by mouth every 8 (eight) hours as needed for muscle spasms.   metoprolol succinate (TOPROL-XL) 100 MG 24 hr tablet TAKE 1 TABLET BY MOUTH DAILY. TAKE WITH OR IMMEDIATELY FOLLOWING A MEAL.   Microlet Lancets MISC 1 each by Does not apply route daily. DX-E11.65   minoxidil (LONITEN) 10 MG tablet Take 1 tablet (10 mg total) by mouth daily.   Multiple Vitamins-Calcium (ONE-A-DAY WOMENS PO) Take 1 tablet by mouth daily.   OVER THE COUNTER MEDICATION 1 tablet daily as needed. Takes generic stool softener for constipation d/t oxycodone.    oxyCODONE (OXY IR/ROXICODONE) 5 MG immediate release tablet Take by mouth.   [START ON 07/30/2022] oxyCODONE-acetaminophen (PERCOCET/ROXICET) 5-325 MG tablet Take 1 tablet by mouth every 8 (eight) hours as needed for severe pain.   [START ON 08/29/2022] oxyCODONE-acetaminophen (PERCOCET/ROXICET) 5-325 MG tablet Take 1 tablet by mouth every 8 (eight) hours as needed for severe pain.   [START ON 09/28/2022] oxyCODONE-acetaminophen (PERCOCET/ROXICET) 5-325 MG tablet Take 1 tablet by mouth every 8 (eight) hours as  needed for severe pain.   pregabalin (LYRICA) 100 MG capsule 100 mg qAM, 200 mg qhs   rosuvastatin (CRESTOR) 5 MG tablet Take 1 tablet (5 mg total) by mouth daily.   Vitamin D, Ergocalciferol, (DRISDOL) 1.25 MG (50000 UNIT) CAPS capsule TAKE 1 CAPSULE BY MOUTH ONE TIME PER WEEK   [DISCONTINUED] ondansetron (ZOFRAN-ODT) 8 MG disintegrating tablet TAKE 1 TABLET EVERY 8 HOURS AS NEEDED FOR NAUSEA OR VOMITING. LET THE TABLET DISSOLVE IN MOUTH   ondansetron (ZOFRAN-ODT) 8 MG disintegrating tablet TAKE 1  TABLET EVERY 8 HOURS AS NEEDED FOR NAUSEA OR VOMITING. LET THE TABLET DISSOLVE IN MOUTH   No facility-administered encounter medications on file as of 07/13/2022.    Surgical History: Past Surgical History:  Procedure Laterality Date   CESAREAN SECTION  1992   CHOLECYSTECTOMY  10/26/2020   COLONOSCOPY     polyps removed first procedure. 2nd time all was clear   COLONOSCOPY WITH PROPOFOL N/A 03/14/2019   Procedure: COLONOSCOPY WITH PROPOFOL;  Surgeon: Lin Landsman, MD;  Location: [facility] ENDOSCOPY;  Service: Gastroenterology;  Laterality: N/A;   FINGER SURGERY Left 2011   left finger cut off x 2.(only up to last digit)   HEMATOMA EVACUATION Left 1999   upper part of foot was injured d/t 500lb weight landing on her foot.    LAPAROSCOPIC SALPINGO OOPHERECTOMY Left 04/02/2018   Procedure: LAPAROSCOPIC SALPINGO OOPHORECTOMY;  Surgeon: Malachy Mood, MD;  Location: [facility] ORS;  Service: Gynecology;  Laterality: Left;    Medical History: Past Medical History:  Diagnosis Date   Anginal pain (HCC)    tightness related to anxiety   Anxiety    Cholecystolithiasis    Depression    Diabetes mellitus without complication (HCC)    GERD (gastroesophageal reflux disease)    throws up easily but not diagnosed with reflux   History of C-section 1992   Hypertension    Sleep apnea    uses cpap    Family History: Family History  Problem Relation Age of Onset   Breast cancer Mother 40       x 3 times   Colon cancer Mother    Brain cancer Mother    Cancer - Colon Mother    Cancer - Other Mother    Heart Problems Father    Colon cancer Brother    Heart Problems Brother     Social History   Socioeconomic History   Marital status: Widowed    Spouse name: Not on file   Number of children: 1   Years of education: Not on file   Highest education level: Not on file  Occupational History   Occupation: works in Secretary/administrator  Tobacco Use   Smoking status: Never   Smokeless  tobacco: Never  Vaping Use   Vaping Use: Never used  Substance and Sexual Activity   Alcohol use: No   Drug use: No   Sexual activity: Not Currently    Birth control/protection: Post-menopausal  Other Topics Concern   Not on file  Social History Narrative   Son has schizophrenia and autism but is fully capable of helping mother after surgery   Social Determinants of Health   Financial Resource Strain: Not on file  Food Insecurity: Not on file  Transportation Needs: Not on file  Physical Activity: Not on file  Stress: Not on file  Social Connections: Not on file  Intimate Partner Violence: Not on file      Review of Systems  Constitutional:  Negative for chills, fatigue and unexpected weight change.  HENT:  Negative for congestion, sneezing and sore throat.   Respiratory: Negative.  Negative for cough, chest tightness, shortness of breath and wheezing.   Cardiovascular: Negative.  Negative for chest pain and palpitations.  Gastrointestinal:  Positive for nausea and vomiting. Negative for abdominal pain, constipation and diarrhea.  Musculoskeletal:  Positive for arthralgias, back pain, gait problem and joint swelling (right knee). Negative for neck pain.  Skin:  Negative for rash.  Psychiatric/Behavioral:  Negative for behavioral problems (Depression), sleep disturbance and suicidal ideas. The patient is not nervous/anxious.     Vital Signs: BP 139/74   Pulse 71   Temp (!) 97.3 F (36.3 C)   Resp 16   Wt (!) 327 lb (148.3 kg)   SpO2 97%   BMI 48.29 kg/m    Physical Exam Vitals reviewed.  Constitutional:      General: She is not in acute distress.    Appearance: Normal appearance. She is obese. She is not ill-appearing.  HENT:     Head: Normocephalic and atraumatic.  Eyes:     Pupils: Pupils are equal, round, and reactive to light.  Cardiovascular:     Rate and Rhythm: Normal rate and regular rhythm.  Pulmonary:     Effort: Pulmonary effort is normal. No  respiratory distress.  Musculoskeletal:     Right knee: No swelling, erythema or ecchymosis. Decreased range of motion. Tenderness present over the medial joint line.     Left knee: Normal.  Neurological:     Mental Status: She is alert and oriented to person, place, and time.     Gait: Gait abnormal (limping).  Psychiatric:        Mood and Affect: Mood normal.        Behavior: Behavior normal.        Assessment/Plan: 1. Bilious vomiting with nausea Zofran refills ordered  2. Acute pain of right knee Some improvement, able to stand on it better. Pain meds from Dr. Holley Raring help some. Going back to work on Monday 07/17/22  3. Right sided sciatica Accompanied by low back pain, will be getting an epidural on her lower back at the end of the month from Dr. Holley Raring for pain relief.    General Counseling: Daleigh verbalizes understanding of the findings of todays visit and agrees with plan of treatment. I have discussed any further diagnostic evaluation that may be needed or ordered today. We also reviewed her medications today. she has been encouraged to call the office with any questions or concerns that should arise related to todays visit.    No orders of the defined types were placed in this encounter.   Meds ordered this encounter  Medications   ondansetron (ZOFRAN-ODT) 8 MG disintegrating tablet    Sig: TAKE 1 TABLET EVERY 8 HOURS AS NEEDED FOR NAUSEA OR VOMITING. LET THE TABLET DISSOLVE IN MOUTH    Dispense:  20 tablet    Refill:  1    Return in about 2 months (around 09/11/2022) for F/U, Recheck A1C, Mauri Tolen PCP.   Total time spent:30 Minutes Time spent includes review of chart, medications, test results, and follow up plan with the patient.   [place] Controlled Substance Database was reviewed by me.  This patient was seen by Jonetta Osgood, FNP-C in collaboration with Dr. Clayborn Bigness as a part of collaborative care agreement.   Nikie Cid R. Valetta Fuller, MSN, FNP-C Internal  medicine

## 2022-07-16 ENCOUNTER — Encounter: Payer: Self-pay | Admitting: Nurse Practitioner

## 2022-07-26 ENCOUNTER — Encounter: Payer: Self-pay | Admitting: Student in an Organized Health Care Education/Training Program

## 2022-07-26 ENCOUNTER — Ambulatory Visit
Payer: No Typology Code available for payment source | Attending: Student in an Organized Health Care Education/Training Program | Admitting: Student in an Organized Health Care Education/Training Program

## 2022-07-26 ENCOUNTER — Ambulatory Visit
Admission: RE | Admit: 2022-07-26 | Discharge: 2022-07-26 | Disposition: A | Discharge status: Home / Self Care | Payer: No Typology Code available for payment source | Source: Ambulatory Visit | Attending: Student in an Organized Health Care Education/Training Program | Admitting: Student in an Organized Health Care Education/Training Program

## 2022-07-26 VITALS — BP 156/92 | HR 73 | Temp 98.5°F | Resp 18 | Ht 69.0 in | Wt 320.0 lb

## 2022-07-26 DIAGNOSIS — M5416 Radiculopathy, lumbar region: Secondary | ICD-10-CM | POA: Diagnosis not present

## 2022-07-26 DIAGNOSIS — G8929 Other chronic pain: Secondary | ICD-10-CM | POA: Diagnosis present

## 2022-07-26 DIAGNOSIS — G894 Chronic pain syndrome: Secondary | ICD-10-CM | POA: Diagnosis present

## 2022-07-26 DIAGNOSIS — M1711 Unilateral primary osteoarthritis, right knee: Secondary | ICD-10-CM | POA: Diagnosis not present

## 2022-07-26 MED ORDER — LIDOCAINE HCL (PF) 2 % IJ SOLN
INTRAMUSCULAR | Status: AC
  Filled 2022-07-26: qty 5

## 2022-07-26 MED ORDER — ROPIVACAINE HCL 2 MG/ML IJ SOLN
INTRAMUSCULAR | Status: AC
  Filled 2022-07-26: qty 20

## 2022-07-26 MED ORDER — IOHEXOL 180 MG/ML  SOLN
INTRAMUSCULAR | Status: AC
  Filled 2022-07-26: qty 20

## 2022-07-26 MED ORDER — ROPIVACAINE HCL 2 MG/ML IJ SOLN
2.0000 mL | Freq: Once | INTRAMUSCULAR | Status: AC
  Administered 2022-07-26: 20 mL via EPIDURAL

## 2022-07-26 MED ORDER — METHYLPREDNISOLONE ACETATE 40 MG/ML IJ SUSP
INTRAMUSCULAR | Status: AC
  Filled 2022-07-26: qty 1

## 2022-07-26 MED ORDER — DEXAMETHASONE SODIUM PHOSPHATE 10 MG/ML IJ SOLN
20.0000 mg | Freq: Once | INTRAMUSCULAR | Status: AC
  Administered 2022-07-26: 20 mg

## 2022-07-26 MED ORDER — SODIUM CHLORIDE 0.9% FLUSH
2.0000 mL | Freq: Once | INTRAVENOUS | Status: AC
  Administered 2022-07-26: 2 mL

## 2022-07-26 MED ORDER — LIDOCAINE HCL 2 % IJ SOLN
20.0000 mL | Freq: Once | INTRAMUSCULAR | Status: AC
  Administered 2022-07-26: 100 mg

## 2022-07-26 MED ORDER — SODIUM CHLORIDE (PF) 0.9 % IJ SOLN
INTRAMUSCULAR | Status: AC
  Filled 2022-07-26: qty 10

## 2022-07-26 MED ORDER — DEXAMETHASONE SODIUM PHOSPHATE 10 MG/ML IJ SOLN
INTRAMUSCULAR | Status: AC
  Filled 2022-07-26: qty 1

## 2022-07-26 MED ORDER — METHYLPREDNISOLONE ACETATE 40 MG/ML IJ SUSP
40.0000 mg | Freq: Once | INTRAMUSCULAR | Status: AC
  Administered 2022-07-26: 40 mg via INTRA_ARTICULAR

## 2022-07-26 MED ORDER — IOHEXOL 180 MG/ML  SOLN
10.0000 mL | Freq: Once | INTRAMUSCULAR | Status: AC
  Administered 2022-07-26: 10 mL via EPIDURAL

## 2022-07-26 MED ORDER — ROPIVACAINE HCL 2 MG/ML IJ SOLN
4.0000 mL | Freq: Once | INTRAMUSCULAR | Status: AC
  Administered 2022-07-26: 4 mL via INTRA_ARTICULAR

## 2022-07-26 NOTE — Progress Notes (Signed)
PROVIDER NOTE: Interpretation of information contained herein should be left to medically-trained personnel. Specific patient instructions are provided elsewhere under "Patient Instructions" section of medical record. This document was created in part using STT-dictation technology, any transcriptional errors that may result from this process are unintentional.  Patient: Candace Clayton Type: Established DOB: 1960/12/16 MRN: [id number] PCP: Jonetta Osgood, NP  Service: Procedure DOS: 07/26/2022 Setting: Ambulatory Location: Ambulatory outpatient facility Delivery: Face-to-face Provider: Gillis Santa, MD Specialty: Interventional Pain Management Specialty designation: 09 Location: Outpatient facility Ref. Prov.: Jonetta Osgood, NP   Interventional Therapy     Procedure:           Type: Steroid Intra-articular Knee Injection #1  Laterality: Right (-RT) Level/approach: Medial Imaging guidance: None required ([id number]) Anesthesia: Local anesthesia (1-2% Lidocaine) DOS: 07/26/2022  Performed by: Gillis Santa, MD  Purpose: Diagnostic/Therapeutic Indications: Knee arthralgia associated to osteoarthritis of the knee 1. Chronic radicular lumbar pain (right)   2. Chronic pain syndrome    NAS-11 score:   Pre-procedure: 4 /10   Post-procedure: 0-No pain (KNEE AND BACK)/10     Pre-Procedure Preparation  Monitoring: As per clinic protocol.  Risk Assessment: Vitals:  SFK:[id number] body mass index is 47.26 kg/m as calculated from the following:   Height as of this encounter: '5\' 9"'$  (1.753 m).   Weight as of this encounter: 320 lb (145.2 kg)., Rate:73ECG Heart Rate: 68, BP:(!) 151/73, Resp:18, Temp:98.5 F (36.9 C), SpO2:99 %  Allergies: She has No Known Allergies.  Precautions: No additional precautions required  Blood-thinner(s): None at this time  Coagulopathies: Reviewed. None identified.   Active Infection(s): Reviewed. None identified. Candace Clayton is afebrile   Location  setting: Exam room Position: Sitting w/ knee bent 90 degrees Safety Precautions: Patient was assessed for positional comfort and pressure points before starting the procedure. Prepping solution: DuraPrep (Iodine Povacrylex [0.7% available iodine] and Isopropyl Alcohol, 74% w/w) Prep Area: Entire knee region Approach: percutaneous, just above the tibial plateau, lateral to the infrapatellar tendon. Intended target: Intra-articular knee space Materials: Tray: Block Needle(s): Regular Qty: 1/side Length: 1.5-inch Gauge: 25G   Meds ordered this encounter  Medications   iohexol (OMNIPAQUE) 180 MG/ML injection 10 mL    Must be Myelogram-compatible. If not available, you may substitute with a water-soluble, non-ionic, hypoallergenic, myelogram-compatible radiological contrast medium.   lidocaine (XYLOCAINE) 2 % (with pres) injection 400 mg   sodium chloride flush (NS) 0.9 % injection 2 mL    This is for a two (2) level block. Use two (2) syringes and divide content in half.   ropivacaine (PF) 2 mg/mL (0.2%) (NAROPIN) injection 2 mL    This is for a two (2) level block. Use two (2) syringes and divide content in half.   dexamethasone (DECADRON) injection 20 mg    This is for a two (2) level block. Use two (2) syringes and divide content in half.   methylPREDNISolone acetate (DEPO-MEDROL) injection 40 mg   ropivacaine (PF) 2 mg/mL (0.2%) (NAROPIN) injection 4 mL    Orders Placed This Encounter  Procedures   DG PAIN CLINIC C-ARM 1-60 MIN NO REPORT    Intraoperative interpretation by procedural physician at [place].    Standing Status:   Standing    Number of Occurrences:   1    Order Specific Question:   Reason for exam:    Answer:   Assistance in needle guidance and placement for procedures requiring needle placement in or near specific anatomical locations not easily accessible  without such assistance.     Time-out: 1011 I initiated and conducted the "Time-out" before  starting the procedure, as per protocol. The patient was asked to participate by confirming the accuracy of the "Time Out" information. Verification of the correct person, site, and procedure were performed and confirmed by me, the nursing staff, and the patient. "Time-out" conducted as per Joint Commission's Universal Protocol (UP.01.01.01). Procedure checklist: Completed  H&P (Pre-op  Assessment)  Candace Clayton is a 62 y.o. (year old), female patient, seen today for interventional treatment. She  has a past surgical history that includes Cesarean section (1992); Finger surgery (Left, 2011); Hematoma evacuation (Left, 1999); Colonoscopy; Laparoscopic salpingo oophorectomy (Left, 04/02/2018); Colonoscopy with propofol (N/A, 03/14/2019); and Cholecystectomy (10/26/2020). Candace Clayton has a current medication list which includes the following prescription(s): albuterol, amlodipine, breztri aerosphere, buspirone, chlorthalidone, clobetasol ointment, desvenlafaxine, trulicity, dulaglutide, fluticasone, contour next test, hydrochlorothiazide, ipratropium-albuterol, metformin, methocarbamol, metoprolol succinate, microlet lancets, minoxidil, multiple vitamins-minerals, ondansetron, OVER THE COUNTER MEDICATION, oxycodone, [START ON 07/30/2022] oxycodone-acetaminophen, [START ON 08/29/2022] oxycodone-acetaminophen, [START ON 09/28/2022] oxycodone-acetaminophen, pregabalin, rosuvastatin, and vitamin d (ergocalciferol). Her primarily concern today is the Back Pain (Right knee and lower back worse on right)  She has No Known Allergies.   Last encounter: My last encounter with her was on 07/11/2022. Pertinent problems: Candace Clayton has Major depressive disorder, recurrent episode, moderate ([place]); Morbid obesity ([place]); Low back pain with sciatica; OSA on CPAP; Lumbar degenerative disc disease; Lumbar facet arthropathy (R>L); Lumbar spondylosis; Chronic pain syndrome; and Type 2 diabetes mellitus with hyperglycemia, without long-term  current use of insulin ([facility]) on their pertinent problem list. Pain Assessment: Severity of Chronic pain is reported as a 4 /10. Location: Back Right, Left, Lower (right knee)/radiates down to right knee. Onset: More than a month ago. Quality: Constant, Shooting, Burning. Timing: Constant. Modifying factor(s): meds, laying down. Vitals:  height is '5\' 9"'$  (1.753 m) and weight is 320 lb (145.2 kg) (abnormal). Her temporal temperature is 98.5 F (36.9 C). Her blood pressure is 156/92 (abnormal) and her pulse is 73. Her respiration is 18 and oxygen saturation is 95%.   Reason for encounter: "interventional pain management therapy due pain of at least four (4) weeks in duration, with failure to respond and/or inability to tolerate more conservative care.  Site Confirmation: Ms. Trusty was asked to confirm the procedure and laterality before marking the site.  Consent: Before the procedure and under the influence of no sedative(s), amnesic(s), or anxiolytics, the patient was informed of the treatment options, risks and possible complications. To fulfill our ethical and legal obligations, as recommended by the American Medical Association's Code of Ethics, I have informed the patient of my clinical impression; the nature and purpose of the treatment or procedure; the risks, benefits, and possible complications of the intervention; the alternatives, including doing nothing; the risk(s) and benefit(s) of the alternative treatment(s) or procedure(s); and the risk(s) and benefit(s) of doing nothing. The patient was provided information about the general risks and possible complications associated with the procedure. These may include, but are not limited to: failure to achieve desired goals, infection, bleeding, organ or nerve damage, allergic reactions, paralysis, and death. In addition, the patient was informed of those risks and complications associated to Spine-related procedures, such as failure to decrease  pain; infection (i.e.: Meningitis, epidural or intraspinal abscess); bleeding (i.e.: epidural hematoma, subarachnoid hemorrhage, or any other type of intraspinal or peri-dural bleeding); organ or nerve damage (i.e.: Any type of peripheral nerve, nerve root, or spinal  cord injury) with subsequent damage to sensory, motor, and/or autonomic systems, resulting in permanent pain, numbness, and/or weakness of one or several areas of the body; allergic reactions; (i.e.: anaphylactic reaction); and/or death. Furthermore, the patient was informed of those risks and complications associated with the medications. These include, but are not limited to: allergic reactions (i.e.: anaphylactic or anaphylactoid reaction(s)); adrenal axis suppression; blood sugar elevation that in diabetics may result in ketoacidosis or comma; water retention that in patients with history of congestive heart failure may result in shortness of breath, pulmonary edema, and decompensation with resultant heart failure; weight gain; swelling or edema; medication-induced neural toxicity; particulate matter embolism and blood vessel occlusion with resultant organ, and/or nervous system infarction; and/or aseptic necrosis of one or more joints. Finally, the patient was informed that Medicine is not an exact science; therefore, there is also the possibility of unforeseen or unpredictable risks and/or possible complications that may result in a catastrophic outcome. The patient indicated having understood very clearly. We have given the patient no guarantees and we have made no promises. Enough time was given to the patient to ask questions, all of which were answered to the patient's satisfaction. Ms. Speights has indicated that she wanted to continue with the procedure. Attestation: I, the ordering provider, attest that I have discussed with the patient the benefits, risks, side-effects, alternatives, likelihood of achieving goals, and potential problems  during recovery for the procedure that I have provided informed consent.  Date  Time: 07/26/2022  9:27 AM  Description of procedure  Start Time: 1011 hrs  Local Anesthesia: Once the patient was positioned, prepped, and time-out was completed. The target area was identified located. The skin was marked with an approved surgical skin marker. Once marked, the skin (epidermis, dermis, and hypodermis), and deeper tissues (fat, connective tissue and muscle) were infiltrated with a small amount of a short-acting local anesthetic, loaded on a 10cc syringe with a 25G, 1.5-in  Needle. An appropriate amount of time was allowed for local anesthetics to take effect before proceeding to the next step. Local Anesthetic: Lidocaine 1-2% The unused portion of the local anesthetic was discarded in the proper designated containers. Safety Precautions: Aspiration looking for blood return was conducted prior to all injections. At no point did I inject any substances, as a needle was being advanced. Before injecting, the patient was told to immediately notify me if she was experiencing any new onset of "ringing in the ears, or metallic taste in the mouth". No attempts were made at seeking any paresthesias. Safe injection practices and needle disposal techniques used. Medications properly checked for expiration dates. SDV (single dose vial) medications used. After the completion of the procedure, all disposable equipment used was discarded in the proper designated medical waste containers.  Technical description: Protocol guidelines were followed. After positioning, the target area was identified and prepped in the usual manner. Skin & deeper tissues infiltrated with local anesthetic. Appropriate amount of time allowed to pass for local anesthetics to take effect. Proper needle placement secured. Once satisfactory needle placement was confirmed, I proceeded to inject the desired solution in slow, incremental fashion,  intermittently assessing for discomfort or any signs of abnormal or undesired spread of substance. Once completed, the needle was removed and disposed of, as per hospital protocols. The area was cleaned, making sure to leave some of the prepping solution back to take advantage of its long term bactericidal properties.  5 cc solution made of 4 cc of 0.2% ropivacaine, 1  cc of methylprednisolone, 40 mg/cc.  Injected into the right knee joint.  Aspiration:  Negative        Vitals:   07/26/22 0931 07/26/22 1010 07/26/22 1015 07/26/22 1018  BP: (!) 151/73 (!) 148/93 (!) 159/95 (!) 156/92  Pulse: 73     Resp:  '18 16 18  '$ Temp: 98.5 F (36.9 C)     TempSrc: Temporal     SpO2: 99% 96% 95% 95%  Weight: (!) 320 lb (145.2 kg)     Height: '5\' 9"'$  (1.753 m)       End Time: 1018 hrs  Imaging guidance  Imaging-assisted Technique: None required. Indication(s): N/A Exposure Time: N/A Contrast: None Fluoroscopic Guidance: N/A Ultrasound Guidance: N/A Interpretation: N/A  Post-op assessment  Post-procedure Vital Signs:  Pulse/HCG Rate: 7368 Temp: 98.5 F (36.9 C) Resp: 18 BP: (!) 156/92 SpO2: 95 %  EBL: None  Complications: No immediate post-treatment complications observed by team, or reported by patient.  Note: The patient tolerated the entire procedure well. A repeat set of vitals were taken after the procedure and the patient was kept under observation following institutional policy, for this type of procedure. Post-procedural neurological assessment was performed, showing return to baseline, prior to discharge. The patient was provided with post-procedure discharge instructions, including a section on how to identify potential problems. Should any problems arise concerning this procedure, the patient was given instructions to immediately contact us, at any time, without hesitation. In any case, we plan to contact the patient by telephone for a follow-up status report regarding this  interventional procedure.  Comments:  No additional relevant information.  Plan of care  Chronic Opioid Analgesic:  Percocet 5 mg 3 times daily as needed, quantity 90/month; MME equals 22.5    Medications administered: We administered iohexol, lidocaine, sodium chloride flush, ropivacaine (PF) 2 mg/mL (0.2%), dexamethasone, methylPREDNISolone acetate, and ropivacaine (PF) 2 mg/mL (0.2%).  Follow-up plan:   Return in about 4 weeks (around 08/23/2022) for F2F PPE.     Recent Visits Date Type Provider Dept  07/11/22 Office Visit Gillis Santa, MD [facility]  06/14/22 Office Visit Gillis Santa, MD [facility]  Showing recent visits within past 90 days and meeting all other requirements Today's Visits Date Type Provider Dept  07/26/22 Procedure visit Gillis Santa, MD [facility]  Showing today's visits and meeting all other requirements Future Appointments Date Type Provider Dept  08/23/22 Appointment Gillis Santa, MD [facility]  10/24/22 Appointment Gillis Santa, MD [facility]  Showing future appointments within next 90 days and meeting all other requirements   Disposition: Discharge home  Discharge (Date  Time): 07/26/2022; 1028 hrs.   Primary Care Physician: Jonetta Osgood, NP Location: [facility] Note by: Gillis Santa, MD Date: 07/26/2022; Time: 11:04 AM  DISCLAIMER: Medicine is not an exact science. It has no guarantees or warranties. The decision to proceed with this intervention was based on the information collected from the patient. Conclusions were drawn from the patient's questionnaire, interview, and examination. Because information was provided in large part by the patient, it cannot be guaranteed that it has not been purposely or unconsciously manipulated or altered. Every effort has been made to obtain as much accurate, relevant, available data as possible. Always take into account  that the treatment will also be dependent on availability of resources and existing treatment guidelines, considered by other Pain Management Specialists as being common knowledge and practice, at the time of the  intervention. It is also important to point out that variation in procedural techniques and pharmacological choices are the acceptable norm. For Medico-Legal review purposes, the indications, contraindications, technique, and results of the these procedures should only be evaluated, judged and interpreted by a Board-Certified Interventional Pain Specialist with extensive familiarity and expertise in the same exact procedure and technique.

## 2022-07-26 NOTE — Progress Notes (Signed)
PROVIDER NOTE: Interpretation of information contained herein should be left to medically-trained personnel. Specific patient instructions are provided elsewhere under "Patient Instructions" section of medical record. This document was created in part using STT-dictation technology, any transcriptional errors that may result from this process are unintentional.  Patient: Candace Clayton Type: Established DOB: 1960/06/30 MRN: [id number] PCP: Jonetta Osgood, NP  Service: Procedure DOS: 07/26/2022 Setting: Ambulatory Location: Ambulatory outpatient facility Delivery: Face-to-face Provider: Gillis Santa, MD Specialty: Interventional Pain Management Specialty designation: 09 Location: Outpatient facility Ref. Prov.: Jonetta Osgood, NP   Interventional Therapy     Procedure:           Type: Lumbar trans-foraminal epidural steroid injection (L-TFESI) #1  Laterality: Right (-RT)  Level: L4 and L5 nerve root(s) Imaging: Fluoroscopy-guided         Anesthesia: Local anesthesia (1-2% Lidocaine) DOS: 07/26/2022  Performed by: Gillis Santa, MD  Purpose: Diagnostic/Therapeutic Indications: Lumbar radicular pain severe enough to impact quality of life or function. 1. Chronic radicular lumbar pain (right)   2. Chronic pain syndrome    NAS-11 Pain score:   Pre-procedure: 4 /10   Post-procedure: 0-No pain (KNEE AND BACK)/10     Position / Prep / Materials:  Position: Prone  Prep solution: DuraPrep (Iodine Povacrylex [0.7% available iodine] and Isopropyl Alcohol, 74% w/w) Prep Area: Entire Posterior Lumbosacral Area.  From the lower tip of the scapula down to the tailbone and from flank to flank. Materials:  Tray: Block Needle(s):  Type: Spinal  Gauge (G): 22  Length: 5-in  Qty: 2      Pre-op H&P Assessment:  Candace Clayton is a 62 y.o. (year old), female patient, seen today for interventional treatment. She  has a past surgical history that includes Cesarean section (1992); Finger  surgery (Left, 2011); Hematoma evacuation (Left, 1999); Colonoscopy; Laparoscopic salpingo oophorectomy (Left, 04/02/2018); Colonoscopy with propofol (N/A, 03/14/2019); and Cholecystectomy (10/26/2020). Candace Clayton has a current medication list which includes the following prescription(s): albuterol, amlodipine, breztri aerosphere, buspirone, chlorthalidone, clobetasol ointment, desvenlafaxine, trulicity, dulaglutide, fluticasone, contour next test, hydrochlorothiazide, ipratropium-albuterol, metformin, methocarbamol, metoprolol succinate, microlet lancets, minoxidil, multiple vitamins-minerals, ondansetron, OVER THE COUNTER MEDICATION, oxycodone, [START ON 07/30/2022] oxycodone-acetaminophen, [START ON 08/29/2022] oxycodone-acetaminophen, [START ON 09/28/2022] oxycodone-acetaminophen, pregabalin, rosuvastatin, and vitamin d (ergocalciferol). Her primarily concern today is the Back Pain (Right knee and lower back worse on right)  Initial Vital Signs:  Pulse/HCG Rate: 73ECG Heart Rate: 68 Temp: 98.5 F (36.9 C) Resp: 18 BP: (!) 151/73 SpO2: 99 %  BMI: Estimated body mass index is 47.26 kg/m as calculated from the following:   Height as of this encounter: '5\' 9"'$  (1.753 m).   Weight as of this encounter: 320 lb (145.2 kg).  Risk Assessment: Allergies: Reviewed. She has No Known Allergies.  Allergy Precautions: None required Coagulopathies: Reviewed. None identified.  Blood-thinner therapy: None at this time Active Infection(s): Reviewed. None identified. Candace Clayton is afebrile  Site Confirmation: Candace Clayton was asked to confirm the procedure and laterality before marking the site Procedure checklist: Completed Consent: Before the procedure and under the influence of no sedative(s), amnesic(s), or anxiolytics, the patient was informed of the treatment options, risks and possible complications. To fulfill our ethical and legal obligations, as recommended by the American Medical Association's Code of  Ethics, I have informed the patient of my clinical impression; the nature and purpose of the treatment or procedure; the risks, benefits, and possible complications of the intervention; the alternatives, including doing nothing; the risk(s) and benefit(s)  of the alternative treatment(s) or procedure(s); and the risk(s) and benefit(s) of doing nothing. The patient was provided information about the general risks and possible complications associated with the procedure. These may include, but are not limited to: failure to achieve desired goals, infection, bleeding, organ or nerve damage, allergic reactions, paralysis, and death. In addition, the patient was informed of those risks and complications associated to Spine-related procedures, such as failure to decrease pain; infection (i.e.: Meningitis, epidural or intraspinal abscess); bleeding (i.e.: epidural hematoma, subarachnoid hemorrhage, or any other type of intraspinal or peri-dural bleeding); organ or nerve damage (i.e.: Any type of peripheral nerve, nerve root, or spinal cord injury) with subsequent damage to sensory, motor, and/or autonomic systems, resulting in permanent pain, numbness, and/or weakness of one or several areas of the body; allergic reactions; (i.e.: anaphylactic reaction); and/or death. Furthermore, the patient was informed of those risks and complications associated with the medications. These include, but are not limited to: allergic reactions (i.e.: anaphylactic or anaphylactoid reaction(s)); adrenal axis suppression; blood sugar elevation that in diabetics may result in ketoacidosis or comma; water retention that in patients with history of congestive heart failure may result in shortness of breath, pulmonary edema, and decompensation with resultant heart failure; weight gain; swelling or edema; medication-induced neural toxicity; particulate matter embolism and blood vessel occlusion with resultant organ, and/or nervous system  infarction; and/or aseptic necrosis of one or more joints. Finally, the patient was informed that Medicine is not an exact science; therefore, there is also the possibility of unforeseen or unpredictable risks and/or possible complications that may result in a catastrophic outcome. The patient indicated having understood very clearly. We have given the patient no guarantees and we have made no promises. Enough time was given to the patient to ask questions, all of which were answered to the patient's satisfaction. Ms. Cormier has indicated that she wanted to continue with the procedure. Attestation: I, the ordering provider, attest that I have discussed with the patient the benefits, risks, side-effects, alternatives, likelihood of achieving goals, and potential problems during recovery for the procedure that I have provided informed consent. Date  Time: 07/26/2022  9:27 AM   Pre-Procedure Preparation:  Monitoring: As per clinic protocol. Respiration, ETCO2, SpO2, BP, heart rate and rhythm monitor placed and checked for adequate function Safety Precautions: Patient was assessed for positional comfort and pressure points before starting the procedure. Time-out: I initiated and conducted the "Time-out" before starting the procedure, as per protocol. The patient was asked to participate by confirming the accuracy of the "Time Out" information. Verification of the correct person, site, and procedure were performed and confirmed by me, the nursing staff, and the patient. "Time-out" conducted as per Joint Commission's Universal Protocol (UP.01.01.01). Time: 1011  Description/Narrative of Procedure:          Target: The 6 o'clock position under the pedicle, on the affected side. Region: Posterolateral Lumbosacral Approach: Posterior Percutaneous Paravertebral approach.  Rationale (medical necessity): procedure needed and proper for the diagnosis and/or treatment of the patient's medical symptoms and  needs. Procedural Technique Safety Precautions: Aspiration looking for blood return was conducted prior to all injections. At no point did we inject any substances, as a needle was being advanced. No attempts were made at seeking any paresthesias. Safe injection practices and needle disposal techniques used. Medications properly checked for expiration dates. SDV (single dose vial) medications used. Description of the Procedure: Protocol guidelines were followed. The patient was placed in position over the procedure table. The  target area was identified and the area prepped in the usual manner. Skin & deeper tissues infiltrated with local anesthetic. Appropriate amount of time allowed to pass for local anesthetics to take effect. The procedure needles were then advanced to the target area. Proper needle placement secured. Negative aspiration confirmed. Solution injected in intermittent fashion, asking for systemic symptoms every 0.5cc of injectate. The needles were then removed and the area cleansed, making sure to leave some of the prepping solution back to take advantage of its long term bactericidal properties.  2 cc solution containing 1 cc of Decadron 10 mg/cc, 1 cc of 0.2% ropivacaine injected for the right L4 nerve after contrast spread 2 cc solution containing 1 cc of Decadron 10 mg/cc, 1 cc of 0.2% ropivacaine injected for the right L5 nerve after contrast spread  Vitals:   07/26/22 0931 07/26/22 1010 07/26/22 1015 07/26/22 1018  BP: (!) 151/73 (!) 148/93 (!) 159/95 (!) 156/92  Pulse: 73     Resp:  '18 16 18  '$ Temp: 98.5 F (36.9 C)     TempSrc: Temporal     SpO2: 99% 96% 95% 95%  Weight: (!) 320 lb (145.2 kg)     Height: '5\' 9"'$  (1.753 m)       Start Time: 1011 hrs. End Time: 1018 hrs.  Imaging Guidance (Spinal):          Type of Imaging Technique: Fluoroscopy Guidance (Spinal) Indication(s): Assistance in needle guidance and placement for procedures requiring needle placement in or  near specific anatomical locations not easily accessible without such assistance. Exposure Time: Please see nurses notes. Contrast: Before injecting any contrast, we confirmed that the patient did not have an allergy to iodine, shellfish, or radiological contrast. Once satisfactory needle placement was completed at the desired level, radiological contrast was injected. Contrast injected under live fluoroscopy. No contrast complications. See chart for type and volume of contrast used. Fluoroscopic Guidance: I was personally present during the use of fluoroscopy. "Tunnel Vision Technique" used to obtain the best possible view of the target area. Parallax error corrected before commencing the procedure. "Direction-depth-direction" technique used to introduce the needle under continuous pulsed fluoroscopy. Once target was reached, antero-posterior, oblique, and lateral fluoroscopic projection used confirm needle placement in all planes. Images permanently stored in EMR. Interpretation: I personally interpreted the imaging intraoperatively. Adequate needle placement confirmed in multiple planes. Appropriate spread of contrast into desired area was observed. No evidence of afferent or efferent intravascular uptake. No intrathecal or subarachnoid spread observed. Permanent images saved into the patient's record.  Post-operative Assessment:  Post-procedure Vital Signs:  Pulse/HCG Rate: 7368 Temp: 98.5 F (36.9 C) Resp: 18 BP: (!) 156/92 SpO2: 95 %  EBL: None  Complications: No immediate post-treatment complications observed by team, or reported by patient.  Note: The patient tolerated the entire procedure well. A repeat set of vitals were taken after the procedure and the patient was kept under observation following institutional policy, for this type of procedure. Post-procedural neurological assessment was performed, showing return to baseline, prior to discharge. The patient was provided with  post-procedure discharge instructions, including a section on how to identify potential problems. Should any problems arise concerning this procedure, the patient was given instructions to immediately contact us, at any time, without hesitation. In any case, we plan to contact the patient by telephone for a follow-up status report regarding this interventional procedure.  Comments:  No additional relevant information.  Plan of Care   5 out of 5 strength  bilateral lower extremity: Plantar flexion, dorsiflexion, knee flexion, knee extension.   Orders:  Orders Placed This Encounter  Procedures   DG PAIN [facility] C-ARM 1-60 MIN NO REPORT    Intraoperative interpretation by procedural physician at [place].    Standing Status:   Standing    Number of Occurrences:   1    Order Specific Question:   Reason for exam:    Answer:   Assistance in needle guidance and placement for procedures requiring needle placement in or near specific anatomical locations not easily accessible without such assistance.   Chronic Opioid Analgesic:  Percocet 5 mg 3 times daily as needed, quantity 90/month; MME equals 22.5    Medications ordered for procedure: Meds ordered this encounter  Medications   iohexol (OMNIPAQUE) 180 MG/ML injection 10 mL    Must be Myelogram-compatible. If not available, you may substitute with a water-soluble, non-ionic, hypoallergenic, myelogram-compatible radiological contrast medium.   lidocaine (XYLOCAINE) 2 % (with pres) injection 400 mg   sodium chloride flush (NS) 0.9 % injection 2 mL    This is for a two (2) level block. Use two (2) syringes and divide content in half.   ropivacaine (PF) 2 mg/mL (0.2%) (NAROPIN) injection 2 mL    This is for a two (2) level block. Use two (2) syringes and divide content in half.   dexamethasone (DECADRON) injection 20 mg    This is for a two (2) level block. Use two (2) syringes and divide content in half.   methylPREDNISolone  acetate (DEPO-MEDROL) injection 40 mg   ropivacaine (PF) 2 mg/mL (0.2%) (NAROPIN) injection 4 mL   Medications administered: We administered iohexol, lidocaine, sodium chloride flush, ropivacaine (PF) 2 mg/mL (0.2%), dexamethasone, methylPREDNISolone acetate, and ropivacaine (PF) 2 mg/mL (0.2%).  See the medical record for exact dosing, route, and time of administration.  Follow-up plan:   Return in about 4 weeks (around 08/23/2022) for F2F PPE.     Recent Visits Date Type Provider Dept  07/11/22 Office Visit Gillis Santa, MD [facility]  06/14/22 Office Visit Gillis Santa, MD [facility]  Showing recent visits within past 90 days and meeting all other requirements Today's Visits Date Type Provider Dept  07/26/22 Procedure visit Gillis Santa, MD [facility]  Showing today's visits and meeting all other requirements Future Appointments Date Type Provider Dept  08/23/22 Appointment Gillis Santa, MD [facility]  10/24/22 Appointment Gillis Santa, MD [facility]  Showing future appointments within next 90 days and meeting all other requirements  Disposition: Discharge home  Discharge (Date  Time): 07/26/2022; 1028 hrs.   Primary Care Physician: Jonetta Osgood, NP Location: [facility] Note by: Gillis Santa, MD Date: 07/26/2022; Time: 11:05 AM  Disclaimer:  Medicine is not an exact science. The only guarantee in medicine is that nothing is guaranteed. It is important to note that the decision to proceed with this intervention was based on the information collected from the patient. The Data and conclusions were drawn from the patient's questionnaire, the interview, and the physical examination. Because the information was provided in large part by the patient, it cannot be guaranteed that it has not been purposely or unconsciously manipulated. Every effort has been made to obtain as much relevant data as  possible for this evaluation. It is important to note that the conclusions that lead to this procedure are derived in large part from the available data. Always take into account that  the treatment will also be dependent on availability of resources and existing treatment guidelines, considered by other Pain Management Practitioners as being common knowledge and practice, at the time of the intervention. For Medico-Legal purposes, it is also important to point out that variation in procedural techniques and pharmacological choices are the acceptable norm. The indications, contraindications, technique, and results of the above procedure should only be interpreted and judged by a Board-Certified Interventional Pain Specialist with extensive familiarity and expertise in the same exact procedure and technique.

## 2022-07-26 NOTE — Patient Instructions (Addendum)
Pain Management Discharge Instructions  General Discharge Instructions :  If you need to reach your doctor call: Monday-Friday 8:00 am - 4:00 pm at [phone] or toll free [phone].  After clinic hours [phone] to have operator reach doctor.  Bring all of your medication bottles to all your appointments in the pain clinic.  To cancel or reschedule your appointment with Pain Management please remember to call 24 hours in advance to avoid a fee.  Refer to the educational materials which you have been given on: General Risks, I had my Procedure. Discharge Instructions, Post Sedation.  Post Procedure Instructions:  The drugs you were given will stay in your system until tomorrow, so for the next 24 hours you should not drive, make any legal decisions or drink any alcoholic beverages.  You may eat anything you prefer, but it is better to start with liquids then soups and crackers, and gradually work up to solid foods.  Please notify your doctor immediately if you have any unusual bleeding, trouble breathing or pain that is not related to your normal pain.  Depending on the type of procedure that was done, some parts of your body may feel week and/or numb.  This usually clears up by tonight or the next day.  Walk with the use of an assistive device or accompanied by an adult for the 24 hours.  You may use ice on the affected area for the first 24 hours.  Put ice in a Ziploc bag and cover with a towel and place against area 15 minutes on 15 minutes off.  You may switch to heat after 24 hours.Selective Nerve Root Block Patient Information  Description: Specific nerve roots exit the spinal canal and these nerves can be compressed and inflamed by a bulging disc and bone spurs.  By injecting steroids on the nerve root, we can potentially decrease the inflammation surrounding these nerves, which often leads to decreased pain.  Also, by injecting local anesthesia on the nerve root, this can  provide us helpful information to give to your referring doctor if it decreases your pain.  Selective nerve root blocks can be done along the spine from the neck to the low back depending on the location of your pain.   After numbing the skin with local anesthesia, a small needle is passed to the nerve root and the position of the needle is verified using x-ray pictures.  After the needle is in correct position, we then deposit the medication.  You may experience a pressure sensation while this is being done.  The entire block usually lasts less than 15 minutes.  Conditions that may be treated with selective nerve root blocks: Low back and leg pain Spinal stenosis Diagnostic block prior to potential surgery Neck and arm pain Post laminectomy syndrome  Preparation for the injection:  Do not eat any solid food or dairy products within 8 hours of your appointment. You may drink clear liquids up to 3 hours before an appointment.  Clear liquids include water, black coffee, juice or soda.  No milk or cream please. You may take your regular medications, including pain medications, with a sip of water before your appointment.  Diabetics should hold regular insulin (if taken separately) and take 1/2 normal NPH dose the morning of the procedure.  Carry some sugar containing items with you to your appointment. A driver must accompany you and be prepared to drive you home after your procedure. Bring all your current medications with you. An IV   may be inserted and sedation may be given at the discretion of the physician. A blood pressure cuff, EKG, and other monitors will often be applied during the procedure.  Some patients may need to have extra oxygen administered for a short period. You will be asked to provide medical information, including allergies, prior to the procedure.  We must know immediately if you are taking blood  Thinners (like Coumadin) or if you are allergic to IV iodine contrast  (dye).  Possible side-effects: All are usually temporary Bleeding from needle site Light headedness Numbness and tingling Decreased blood pressure Weakness in arms/legs Pressure sensation in back/neck Pain at injection site (several days)  Possible complications: All are extremely rare Infection Nerve injury Spinal headache (a headache wore with upright position)  Call if you experience: Fever/chills associated with headache or increased back/neck pain Headache worsened by an upright position New onset weakness or numbness of an extremity below the injection site Hives or difficulty breathing (go to the emergency room) Inflammation or drainage at the injection site(s) Severe back/neck pain greater than usual New symptoms which are concerning to you  Please note:  Although the local anesthetic injected can often make your back or neck feel good for several hours after the injection the pain will likely return.  It takes 3-5 days for steroids to work on the nerve root. You may not notice any pain relief for at least one week.  If effective, we will often do a series of 3 injections spaced 3-6 weeks apart to maximally decrease your pain.    If you have any questions, please call [phone] [facility] ____________________________________________________________________________________________  Post-procedure Information What to expect: Most procedures involve the use of a local anesthetic (numbing medicine), and a steroid (anti-inflammatory medicine).  The local anesthetics may cause temporary numbness and weakness of the legs or arms, depending on the location of the block. This numbness/weakness may last 4-6 hours, depending on the local anesthetic used. In rare instances, it can last up to 24 hours. While numb, you must be very careful not to injure the extremity.  After any procedure, you could expect the pain to get better within 15-20  minutes. This relief is temporary and may last 4-6 hours. Once the local anesthetics wears off, you could experience discomfort, possibly more than usual, for up to 10 (ten) days. In the case of radiofrequencies, it may last up to 6 weeks. Surgeries may take up to 8 weeks for the healing process. The discomfort is due to the irritation caused by needles going through skin and muscle. To minimize the discomfort, we recommend using ice the first day, and heat from then on. The ice should be applied for 15 minutes on, and 15 minutes off. Keep repeating this cycle until bedtime. Avoid applying the ice directly to the skin, to prevent frostbite. Heat should be used daily, until the pain improves (4-10 days). Be careful not to burn yourself.  Occasionally you may experience muscle spasms or cramps. These occur as a consequence of the irritation caused by the needle sticks to the muscle and the blood that will inevitably be lost into the surrounding muscle tissue. Blood tends to be very irritating to tissues, which tend to react by going into spasm. These spasms may start the same day of your procedure, but they may also take days to develop. This late onset type of spasm or cramp is usually caused by electrolyte imbalances triggered by the steroids, at  the level of the kidney. Cramps and spasms tend to respond well to muscle relaxants, multivitamins (some are triggered by the procedure, but may have their origins in vitamin deficiencies), and "Gatorade", or any sports drinks that can replenish any electrolyte imbalances. (If you are a diabetic, ask your pharmacist to get you a sugar-free brand.) Warm showers or baths may also be helpful. Stretching exercises are highly recommended.  General Instructions:  Be alert for signs of possible infection: redness, swelling, heat, red streaks, elevated temperature, and/or fever. These typically appear 4 to 6 days after the procedure. Immediately notify your doctor if you  experience unusual bleeding, difficulty breathing, or loss of bowel or bladder control. If you experience increased pain, do not increase your pain medicine intake, unless instructed by your pain physician.  Post-Procedure Care:  Be careful in moving about. Muscle spasms in the area of the injection may occur. Applying ice or heat to the area is often helpful. The incidence of spinal headaches after epidural injections ranges between 1.4% and 6%. If you develop a headache that does not seem to respond to conservative therapy, please let your physician know. This can be treated with an epidural blood patch.   Post-procedure numbness or redness is to be expected, however it should average 4 to 6 hours. If numbness and weakness of your extremities begins to develop 4 to 6 hours after your procedure, and is felt to be progressing and worsening, immediately contact your physician.  Diet:  If you experience nausea, do not eat until this sensation goes away. If you had a "Stellate Ganglion Block" for upper extremity "Reflex Sympathetic Dystrophy", do not eat or drink until your hoarseness goes away. In any case, always start with liquids first and if you tolerate them well, then slowly progress to more solid foods.  Activity:  For the first 4 to 6 hours after the procedure, use caution in moving about as you may experience numbness and/or weakness. Use caution in cooking, using household electrical appliances, and climbing steps. If you need to reach your Doctor call our office: [phone] (During business hours) or [phone] (After business hours).  Business Hours: Monday-Thursday 8:00 am - 4:00 PM    Fridays: Closed     In case of an emergency: In case of emergency, call 911 or go to the nearest emergency room and have the physician there call us.  Interpretation of Procedure Every nerve block has two components: a diagnostic component, and a treatment component. Unrealistic expectations  are the most common causes of "perceived failure".  In a perfect world, a single nerve block should be able to completely and permanently eliminate the pain. Sadly, the world is not perfect.  Most pain management nerve blocks are performed using local anesthetics and steroids. Steroids are responsible for any long-term benefit that you may experience. Their purpose is to decrease any chronic swelling that may exist in the area. Steroids begin to work immediately after being injected. However, most patients will not experience any benefits until 5 to 10 days after the injection, when the swelling has come down to the point where they can tell a difference. Steroids will only help if there is swelling to be treated. As such, they can assist with the diagnosis. If effective, they suggest an inflammatory component to the pain, and if ineffective, they rule out inflammation as the main cause or component of the problem. If the problem is one of mechanical compression, you will get no benefit  from those steroids.   In the case of local anesthetics, they have a crucial role in the diagnosis of your condition. Most will begin to work within15 to 20 minutes after injection. The duration will depend on the type used (short- vs. Long-acting). It is of outmost importance that patients keep tract of their pain, after the procedure. To assist with this matter, a "Post-procedure Pain Diary" is provided. Make sure to complete it and to bring it back to your follow-up appointment.  As long as the patient keeps accurate, detailed records of their symptoms after every procedure, and returns to have those interpreted, every procedure will provide [place] with invaluable information. Even a block that does not provide the patient with any relief, will always provide [place] with information about the mechanism and the origin of the pain. The only time a nerve block can be considered a waste of time is when patients do not keep track of the  results, or do not keep their post-procedure appointment.  Reporting the results back to your physician The Pain Score  Pain is a subjective complaint. It cannot be seen, touched, or measured. We depend entirely on the patient's report of the pain in order to assess your condition and treatment. To evaluate the pain, we use a pain scale, where "0" means "No Pain", and a "10" is "the worst possible pain that you can even imagine" (i.e. something like been eaten alive by a shark or being torn apart by a lion).   Use the Pain Scale provided. You will frequently be asked to rate your pain. Please be accurate, remember that medical decisions will be based on your responses. Please do not rate your pain above a 10. Doing so is actually interpreted as "symptom magnification" (exaggeration). To put this into perspective, when you tell us that your pain is at a 10 (ten), what you are saying is that there is nothing we can do to make this pain any worse. (Carefully think about that.) ____________________________________________________________________________________________

## 2022-07-27 ENCOUNTER — Telehealth: Payer: Self-pay

## 2022-07-27 NOTE — Telephone Encounter (Signed)
Post procedure follow up.  LM 

## 2022-08-15 ENCOUNTER — Other Ambulatory Visit: Payer: Self-pay | Admitting: Nurse Practitioner

## 2022-08-15 DIAGNOSIS — F411 Generalized anxiety disorder: Secondary | ICD-10-CM

## 2022-08-18 ENCOUNTER — Other Ambulatory Visit: Payer: Self-pay | Admitting: Nurse Practitioner

## 2022-08-18 DIAGNOSIS — Z760 Encounter for issue of repeat prescription: Secondary | ICD-10-CM

## 2022-08-21 ENCOUNTER — Other Ambulatory Visit: Payer: Self-pay | Admitting: Student in an Organized Health Care Education/Training Program

## 2022-08-21 DIAGNOSIS — G894 Chronic pain syndrome: Secondary | ICD-10-CM

## 2022-08-21 DIAGNOSIS — M47816 Spondylosis without myelopathy or radiculopathy, lumbar region: Secondary | ICD-10-CM

## 2022-08-21 DIAGNOSIS — M5412 Radiculopathy, cervical region: Secondary | ICD-10-CM

## 2022-08-21 DIAGNOSIS — M5136 Other intervertebral disc degeneration, lumbar region: Secondary | ICD-10-CM

## 2022-08-21 NOTE — Telephone Encounter (Signed)
Please review and send

## 2022-08-23 ENCOUNTER — Encounter: Payer: Self-pay | Admitting: Student in an Organized Health Care Education/Training Program

## 2022-08-23 ENCOUNTER — Ambulatory Visit
Payer: No Typology Code available for payment source | Attending: Student in an Organized Health Care Education/Training Program | Admitting: Student in an Organized Health Care Education/Training Program

## 2022-08-23 VITALS — BP 133/68 | HR 70 | Temp 97.4°F | Resp 17 | Ht 69.0 in | Wt 320.0 lb

## 2022-08-23 DIAGNOSIS — G8929 Other chronic pain: Secondary | ICD-10-CM | POA: Diagnosis present

## 2022-08-23 DIAGNOSIS — M1711 Unilateral primary osteoarthritis, right knee: Secondary | ICD-10-CM | POA: Diagnosis not present

## 2022-08-23 DIAGNOSIS — G894 Chronic pain syndrome: Secondary | ICD-10-CM | POA: Diagnosis not present

## 2022-08-23 DIAGNOSIS — M5416 Radiculopathy, lumbar region: Secondary | ICD-10-CM | POA: Diagnosis not present

## 2022-08-23 NOTE — Progress Notes (Signed)
Safety precautions to be maintained throughout the outpatient stay will include: orient to surroundings, keep bed in low position, maintain call bell within reach at all times, provide assistance with transfer out of bed and ambulation.  

## 2022-08-23 NOTE — Progress Notes (Signed)
PROVIDER NOTE: Information contained herein reflects review and annotations entered in association with encounter. Interpretation of such information and data should be left to medically-trained personnel. Information provided to patient can be located elsewhere in the medical record under "Patient Instructions". Document created using STT-dictation technology, any transcriptional errors that may result from process are unintentional.    Patient: Candace Clayton  Service Category: E/M  Provider: Gillis Santa, MD  DOB: 1961/04/01  DOS: 08/23/2022  Referring Provider: Jonetta Osgood, NP  MRN: [id number]  Specialty: Interventional Pain Management  PCP: Jonetta Osgood, NP  Type: Established Patient  Setting: Ambulatory outpatient    Location: Office  Delivery: Face-to-face     HPI  Candace Clayton, a 62 y.o. year old female, is here today because of her Chronic radicular lumbar pain [M54.16, G89.29]. [name] primary complain today is Back Pain and Knee Pain (Right) Last encounter: My last encounter with her was on 08/21/2022. Pertinent problems: [name] has Major depressive disorder, recurrent episode, moderate ([place]); Morbid obesity ([place]); Low back pain with sciatica; OSA on CPAP; Lumbar degenerative disc disease; Lumbar facet arthropathy (R>L); Lumbar spondylosis; Chronic pain syndrome; and Type 2 diabetes mellitus with hyperglycemia, without long-term current use of insulin (HCC) on their pertinent problem list. Pain Assessment: Severity of Chronic pain is reported as a 6 /10. Location: Back Lower/down to right knee. Onset: More than a month ago. Quality: Constant, Burning, Shooting. Timing: Constant. Modifying factor(s): meds, injections, reclining. Vitals:  height is '5\' 9"'$  (1.753 m) and weight is 320 lb (145.2 kg) (abnormal). Her temporal temperature is 97.4 F (36.3 C) (abnormal). Her blood pressure is 133/68 and her pulse is 70. Her respiration is 17 and oxygen saturation is 97%.   BMI: Estimated body mass index is 47.26 kg/m as calculated from the following:   Height as of this encounter: '5\' 9"'$  (1.753 m).   Weight as of this encounter: 320 lb (145.2 kg).  Reason for encounter: post-procedure evaluation and assessment.   Post-procedure evaluation   Procedure#1 Type: Lumbar trans-foraminal epidural steroid injection (L-TFESI) #1  Laterality: Right (-RT)  Level: L4 and L5 nerve root(s) Imaging: Fluoroscopy-guided         Anesthesia: Local anesthesia (1-2% Lidocaine) DOS: 07/26/2022  Performed by: Gillis Santa, MD  Procedure#2 Type: Steroid Intra-articular Knee Injection #1  Laterality: Right (-RT) Level/approach: Medial Imaging guidance: None required [id number]) Anesthesia: Local anesthesia (1-2% Lidocaine) DOS: 07/26/2022  Performed by: Gillis Santa, MD     Effectiveness:  Initial hour after procedure: 100 % (RIGHT KNEE and BACK 100%)  Subsequent 4-6 hours post-procedure: 100 % (KNEE and back) Analgesia past initial 6 hours: 100 % (KNEE; Back 0 relief; preprocedure pain level)  Ongoing improvement:  Analgesic:  100% knee, 0% for low back and radiating leg pain Function: [name] reports improvement in function for right knee    Pharmacotherapy Assessment  Analgesic: Percocet 5 mg 3 times daily as needed, quantity 90/month; MME equals 22.5    Monitoring: [place] PMP: PDMP not reviewed this encounter.       Pharmacotherapy: No side-effects or adverse reactions reported. Compliance: No problems identified. Effectiveness: Clinically acceptable.  Rise Patience, RN  08/23/2022  2:12 PM  Sign when Signing Visit Safety precautions to be maintained throughout the outpatient stay will include: orient to surroundings, keep bed in low position, maintain call bell within reach at all times, provide assistance with transfer out of bed and ambulation.     No results found for: "CBDTHCR"  No results found for: "D8THCCBX" No results found for: "D9THCCBX"   UDS:  Summary  Date Value Ref Range Status  01/05/2022 Note  Final    Comment:    ==================================================================== [employer] [facility] (MW) ==================================================================== Test                             Result       Flag       Units  Drug Present and Declared for Prescription Verification   Oxycodone                      3048         EXPECTED   ng/mg creat   Oxymorphone                    782          EXPECTED   ng/mg creat   Noroxycodone                   1433         EXPECTED   ng/mg creat   Noroxymorphone                 232          EXPECTED   ng/mg creat    Sources of oxycodone are scheduled prescription medications.    Oxymorphone, noroxycodone, and noroxymorphone are expected    metabolites of oxycodone. Oxymorphone is also available as a    scheduled prescription medication.  ==================================================================== Test                      Result    Flag   Units      Ref Range   Creatinine              66               mg/dL      >=20 ==================================================================== Declared Medications:  The flagging and interpretation on this report are based on the  following declared medications.  Unexpected results may arise from  inaccuracies in the declared medications.   **Note: The testing scope of this panel includes these medications:   Oxycodone (Percocet)   **Note: The testing scope of this panel does not include the  following reported medications:   Acetaminophen (Percocet)  Albuterol (Ventolin HFA)  Albuterol (Duoneb)  Amlodipine (Norvasc)  Budesonide (Breztri Aerosphere)  Buspirone (Buspar)  Chlorthalidone (Hygroton)  Desvenlafaxine (Pristiq)  Docusate (Stool Softener)  Dulaglutide (Trulicity)  Fluticasone (Flonase)  Formoterol (Breztri Aerosphere)  Glycopyrrolate (Breztri Aerosphere)  Hydrochlorothiazide (Hydrodiuril)   Ipratropium (Duoneb)  Metformin (Glucophage)  Methocarbamol (Robaxin)  Metoprolol (Toprol)  Minoxidil (Loniten)  Multivitamin  Pregabalin (Lyrica)  Rosuvastatin (Crestor)  Vitamin D2 (Drisdol) ==================================================================== For clinical consultation, please call [phone]. ====================================================================       ROS  Constitutional: Denies any fever or chills Gastrointestinal: No reported hemesis, hematochezia, vomiting, or acute GI distress Musculoskeletal:  Low back pain with radiation into right lateral thigh and right anterior thigh in a dermatomal fashion Neurological: No reported episodes of acute onset apraxia, aphasia, dysarthria, agnosia, amnesia, paralysis, loss of coordination, or loss of consciousness  Medication Review  Budeson-Glycopyrrol-Formoterol, Dulaglutide, Microlet Lancets, Multiple Vitamins-Minerals, OVER THE COUNTER MEDICATION, Vitamin D (Ergocalciferol), albuterol, amLODipine, busPIRone, chlorthalidone, clobetasol ointment, desvenlafaxine, fluticasone, glucose blood, hydrochlorothiazide, ipratropium-albuterol, metFORMIN, methocarbamol, metoprolol succinate, minoxidil, ondansetron, oxyCODONE, oxyCODONE-acetaminophen, pregabalin, and  rosuvastatin  History Review  Allergy: Ms. Mancinas has No Known Allergies. Drug: Ms. Vero  reports no history of drug use. Alcohol:  reports no history of alcohol use. Tobacco:  reports that she has never smoked. She has never used smokeless tobacco. Social: Ms. Pickering  reports that she has never smoked. She has never used smokeless tobacco. She reports that she does not drink alcohol and does not use drugs. Medical:  has a past medical history of Anginal pain ([place]), Anxiety, Cholecystolithiasis, Depression, Diabetes mellitus without complication ([place]), GERD (gastroesophageal reflux disease), History of C-section (1992), Hypertension, and Sleep  apnea. Surgical: Ms. Mouse  has a past surgical history that includes Cesarean section (1992); Finger surgery (Left, 2011); Hematoma evacuation (Left, 1999); Colonoscopy; Laparoscopic salpingo oophorectomy (Left, 04/02/2018); Colonoscopy with propofol (N/A, 03/14/2019); and Cholecystectomy (10/26/2020). Family: family history includes Brain cancer in her mother; Breast cancer (age of onset: 42) in her mother; Cancer - Colon in her mother; Cancer - Other in her mother; Colon cancer in her brother and mother; Heart Problems in her brother and father.  Laboratory Chemistry Profile   Renal Lab Results  Component Value Date   BUN 14 02/21/2022   CREATININE 0.76 02/21/2022   BCR 16 12/02/2021   GFRAA 106 02/23/2020   GFRNONAA >60 02/21/2022    Hepatic Lab Results  Component Value Date   AST 40 02/21/2022   ALT 33 02/21/2022   ALBUMIN 4.4 02/21/2022   ALKPHOS 95 02/21/2022   LIPASE 26 02/21/2022    Electrolytes Lab Results  Component Value Date   NA 137 02/21/2022   K 4.0 02/21/2022   CL 102 02/21/2022   CALCIUM 9.5 02/21/2022    Bone Lab Results  Component Value Date   VD25OH 40.9 12/02/2021    Inflammation (CRP: Acute Phase) (ESR: Chronic Phase) Lab Results  Component Value Date   CRP 2 05/24/2021   ESRSEDRATE 12 05/24/2021         Note: Above Lab results reviewed.  Physical Exam  General appearance: Well nourished, well developed, and well hydrated. In no apparent acute distress Mental status: Alert, oriented x 3 (person, place, & time)       Respiratory: No evidence of acute respiratory distress Eyes: PERLA Vitals: BP 133/68   Pulse 70   Temp (!) 97.4 F (36.3 C) (Temporal)   Resp 17   Ht '5\' 9"'$  (1.753 m)   Wt (!) 320 lb (145.2 kg)   SpO2 97%   BMI 47.26 kg/m  BMI: Estimated body mass index is 47.26 kg/m as calculated from the following:   Height as of this encounter: '5\' 9"'$  (1.753 m).   Weight as of this encounter: 320 lb (145.2 kg). Ideal: Ideal body  weight: 66.2 kg (145 lb 15.1 oz) Adjusted ideal body weight: 97.8 kg (215 lb 9.1 oz)  Lumbar Spine Area Exam  Skin & Axial Inspection: No masses, redness, or swelling Alignment: Symmetrical Functional ROM: Pain restricted ROM affecting primarily the right Stability: No instability detected Muscle Tone/Strength: Functionally intact. No obvious neuro-muscular anomalies detected. Sensory (Neurological): Dermatomal pain pattern right L4, L5 Palpation: No palpable anomalies       Provocative Tests: Hyperextension/rotation test: deferred today       Lumbar quadrant test (Kemp's test): (+) on the right for foraminal stenosis Lateral bending test: (+) ipsilateral radicular pain, on the right. Positive for right-sided foraminal stenosis.  Assessment   Diagnosis Status  1. Chronic radicular lumbar pain (right)   2. Primary osteoarthritis  of right knee   3. Chronic pain syndrome    Persistent Improved Controlled   Updated Problems: No problems updated.  Plan of Care  Unfortunately, no significant therapeutic benefit with right L4-L5 transforaminal epidural steroid injection.  Discussed repeating utilizing an interlaminar approach to see if that had a better result for her right radicular pain.  She is trying to do lumbar spinal exercises at home.  She endorses 100% pain relief after her right knee injection and is grateful for that.  Will continue to monitor her right knee pain and repeat as needed.  Plan for right L4-L5 interlaminar ESI in 1 to 2 weeks.  Risks and benefits reviewed and patient would like to proceed.  Continue with medications as prescribed.   Orders:  Orders Placed This Encounter  Procedures   Lumbar Epidural Injection    Standing Status:   Future    Standing Expiration Date:   11/21/2022    Scheduling Instructions:     Procedure: Interlaminar Lumbar Epidural Steroid injection (LESI)            Laterality: Right L4/5     Sedation: without     Timeframe: ASAA    Order  Specific Question:   Where will this procedure be performed?    Answer:   [facility]   Follow-up plan:   Return in about 1 week (around 08/30/2022) for Right L4/5 IL ESI, in clinic [facility].      Recent Visits Date Type Provider Dept  07/26/22 Procedure visit Gillis Santa, MD [facility]  07/11/22 Office Visit Gillis Santa, MD [facility]  06/14/22 Office Visit Gillis Santa, MD [facility]  Showing recent visits within past 90 days and meeting all other requirements Today's Visits Date Type Provider Dept  08/23/22 Office Visit Gillis Santa, MD [facility]  Showing today's visits and meeting all other requirements Future Appointments Date Type Provider Dept  10/24/22 Appointment Gillis Santa, MD [facility]  Showing future appointments within next 90 days and meeting all other requirements  I discussed the assessment and treatment plan with the patient. The patient was provided an opportunity to ask questions and all were answered. The patient agreed with the plan and demonstrated an understanding of the instructions.  Patient advised to call back or seek an in-person evaluation if the symptoms or condition worsens.  Duration of encounter: 65mnutes.  Total time on encounter, as per AMA guidelines included both the face-to-face and non-face-to-face time personally spent by the physician and/or other qualified health care professional(s) on the day of the encounter (includes time in activities that require the physician or other qualified health care professional and does not include time in activities normally performed by clinical staff). Physician's time may include the following activities when performed: Preparing to see the patient (e.g., pre-charting review of records, searching for previously ordered imaging, lab work, and nerve conduction tests) Review of prior analgesic pharmacotherapies. Reviewing PMP Interpreting  ordered tests (e.g., lab work, imaging, nerve conduction tests) Performing post-procedure evaluations, including interpretation of diagnostic procedures Obtaining and/or reviewing separately obtained history Performing a medically appropriate examination and/or evaluation Counseling and educating the patient/family/caregiver Ordering medications, tests, or procedures Referring and communicating with other health care professionals (when not separately reported) Documenting clinical information in the electronic or other health record Independently interpreting results (not separately reported) and communicating results to the patient/ family/caregiver Care coordination (not separately reported)  Note by: BGillis Santa MD Date: 08/23/2022; Time:  2:46 PM

## 2022-09-07 ENCOUNTER — Other Ambulatory Visit: Payer: Self-pay | Admitting: Nurse Practitioner

## 2022-09-07 DIAGNOSIS — I10 Essential (primary) hypertension: Secondary | ICD-10-CM

## 2022-09-07 DIAGNOSIS — L659 Nonscarring hair loss, unspecified: Secondary | ICD-10-CM

## 2022-09-12 ENCOUNTER — Ambulatory Visit: Payer: No Typology Code available for payment source | Admitting: Nurse Practitioner

## 2022-09-12 ENCOUNTER — Encounter: Payer: Self-pay | Admitting: Nurse Practitioner

## 2022-09-12 VITALS — BP 138/70 | HR 76 | Temp 97.9°F | Resp 16 | Ht 69.0 in | Wt 310.0 lb

## 2022-09-12 DIAGNOSIS — E1165 Type 2 diabetes mellitus with hyperglycemia: Secondary | ICD-10-CM | POA: Diagnosis not present

## 2022-09-12 DIAGNOSIS — E538 Deficiency of other specified B group vitamins: Secondary | ICD-10-CM | POA: Diagnosis not present

## 2022-09-12 DIAGNOSIS — M5431 Sciatica, right side: Secondary | ICD-10-CM

## 2022-09-12 DIAGNOSIS — E559 Vitamin D deficiency, unspecified: Secondary | ICD-10-CM

## 2022-09-12 LAB — POCT GLYCOSYLATED HEMOGLOBIN (HGB A1C): Hemoglobin A1C: 7.1 % — AB (ref 4.0–5.6)

## 2022-09-12 NOTE — Progress Notes (Signed)
[facility] [place], [place] [address]  Internal MEDICINE  Office Visit Note  Patient Name: Candace Clayton  [id number]  [id number]  Date of Service: 09/13/2022  Chief Complaint  Patient presents with   Diabetes   Depression   Gastroesophageal Reflux   Hypertension    HPI Rakell presents for a follow-up visit for diabetes  Lost 10 lbs, improved A1c to 7.1, almost at goal.  Working 8 hr shift which is more tolerable. Wants to stay working for now  Seeing dr. Holley Raring, epidural did not work, going to try again Neck still feels ok.     Current Medication: Outpatient Encounter Medications as of 09/12/2022  Medication Sig   albuterol (VENTOLIN HFA) 108 (90 Base) MCG/ACT inhaler TAKE 2 PUFFS BY MOUTH EVERY 6 HOURS AS NEEDED FOR WHEEZE OR SHORTNESS OF BREATH   amLODipine (NORVASC) 10 MG tablet TAKE 1 TABLET BY MOUTH EVERY DAY   Budeson-Glycopyrrol-Formoterol (BREZTRI AEROSPHERE) 160-9-4.8 MCG/ACT AERO Inhale 2 puffs into the lungs 2 (two) times daily.   busPIRone (BUSPAR) 15 MG tablet TAKE 1 TABLET (15 MG TOTAL) BY MOUTH 3 (THREE) TIMES DAILY FOR ANXIETY.   chlorthalidone (HYGROTON) 25 MG tablet TAKE 1 TABLET (25 MG TOTAL) BY MOUTH DAILY.   clobetasol ointment (TEMOVATE) [id number] % Apply 1 Application topically daily. To affected area until resolved.   desvenlafaxine (PRISTIQ) 100 MG 24 hr tablet Take 1 tablet (100 mg total) by mouth daily.   Dulaglutide (TRULICITY) 3 0000000 SOPN Inject 3 mg as directed once a week.   Dulaglutide (TRULICITY) 4.5 0000000 SOPN INJECT 4.5 MG INTO THE SKIN ONCE A WEEK.   fluticasone (FLONASE) 50 MCG/ACT nasal spray Place 2 sprays into both nostrils daily.   glucose blood (CONTOUR NEXT TEST) test strip 1 each by Other route daily. DX E11.65   hydrochlorothiazide (HYDRODIURIL) 25 MG tablet Take 1 tablet (25 mg total) by mouth daily.   ipratropium-albuterol (DUONEB) 0.5-2.5 (3) MG/3ML SOLN Take 3 mLs by nebulization every 4 (four)  hours as needed (shortness of breath or wheezing).   metFORMIN (GLUCOPHAGE) 500 MG tablet TAKE 1 TABLET BY MOUTH TWICE A DAY WITH FOOD   methocarbamol (ROBAXIN) 750 MG tablet Take 1 tablet (750 mg total) by mouth every 8 (eight) hours as needed for muscle spasms.   metoprolol succinate (TOPROL-XL) 100 MG 24 hr tablet TAKE 1 TABLET BY MOUTH DAILY. TAKE WITH OR IMMEDIATELY FOLLOWING A MEAL.   Microlet Lancets MISC 1 each by Does not apply route daily. DX-E11.65   minoxidil (LONITEN) 10 MG tablet TAKE 1 TABLET BY MOUTH EVERY DAY   Multiple Vitamins-Calcium (ONE-A-DAY WOMENS PO) Take 1 tablet by mouth daily.   ondansetron (ZOFRAN-ODT) 8 MG disintegrating tablet TAKE 1 TABLET EVERY 8 HOURS AS NEEDED FOR NAUSEA OR VOMITING. LET THE TABLET DISSOLVE IN MOUTH   OVER THE COUNTER MEDICATION 1 tablet daily as needed. Takes generic stool softener for constipation d/t oxycodone.    oxyCODONE (OXY IR/ROXICODONE) 5 MG immediate release tablet Take by mouth.   oxyCODONE-acetaminophen (PERCOCET/ROXICET) 5-325 MG tablet Take 1 tablet by mouth every 8 (eight) hours as needed for severe pain.   [START ON 09/28/2022] oxyCODONE-acetaminophen (PERCOCET/ROXICET) 5-325 MG tablet Take 1 tablet by mouth every 8 (eight) hours as needed for severe pain.   pregabalin (LYRICA) 100 MG capsule 100 mg qAM, 200 mg qhs   rosuvastatin (CRESTOR) 5 MG tablet Take 1 tablet (5 mg total) by mouth daily.   Vitamin D, Ergocalciferol, (DRISDOL) 1.25  MG (50000 UNIT) CAPS capsule TAKE 1 CAPSULE BY MOUTH ONE TIME PER WEEK   oxyCODONE-acetaminophen (PERCOCET/ROXICET) 5-325 MG tablet Take 1 tablet by mouth every 8 (eight) hours as needed for severe pain.   No facility-administered encounter medications on file as of 09/12/2022.    Surgical History: Past Surgical History:  Procedure Laterality Date   CESAREAN SECTION  1992   CHOLECYSTECTOMY  10/26/2020   COLONOSCOPY     polyps removed first procedure. 2nd time all was clear   COLONOSCOPY WITH  PROPOFOL N/A 03/14/2019   Procedure: COLONOSCOPY WITH PROPOFOL;  Surgeon: Lin Landsman, MD;  Location: [facility] END[facility];  Service: Gastroenterology;  Laterality: N/A;   FINGER SURGERY Left 2011   left finger cut off x 2.(only up to last digit)   HEMATOMA EVACUATION Left 1999   upper part of foot was injured d/t 500lb weight landing on her foot.    LAPAROSCOPIC SALPINGO OOPHERECTOMY Left 04/02/2018   Procedure: LAPAROSCOPIC SALPINGO OOPHORECTOMY;  Surgeon: Malachy Mood, MD;  Location: [facility];  Service: Gynecology;  Laterality: Left;    Medical History: Past Medical History:  Diagnosis Date   Anginal pain (HCC)    tightness related to anxiety   Anxiety    Cholecystolithiasis    Depression    Diabetes mellitus without complication (HCC)    GERD (gastroesophageal reflux disease)    throws up easily but not diagnosed with reflux   History of C-section 1992   Hypertension    Sleep apnea    uses cpap    Family History: Family History  Problem Relation Age of Onset   Breast cancer Mother 10       x 3 times   Colon cancer Mother    Brain cancer Mother    Cancer - Colon Mother    Cancer - Other Mother    Heart Problems Father    Colon cancer Brother    Heart Problems Brother     Social History   Socioeconomic History   Marital status: Widowed    Spouse name: Not on file   Number of children: 1   Years of education: Not on file   Highest education level: Not on file  Occupational History   Occupation: works in Secretary/administrator  Tobacco Use   Smoking status: Never   Smokeless tobacco: Never  Vaping Use   Vaping Use: Never used  Substance and Sexual Activity   Alcohol use: No   Drug use: No   Sexual activity: Not Currently    Birth control/protection: Post-menopausal  Other Topics Concern   Not on file  Social History Narrative   Son has schizophrenia and autism but is fully capable of helping mother after surgery   Social Determinants of Health    Financial Resource Strain: Not on file  Food Insecurity: Not on file  Transportation Needs: Not on file  Physical Activity: Not on file  Stress: Not on file  Social Connections: Not on file  Intimate Partner Violence: Not on file      Review of Systems  Constitutional:  Negative for chills, fatigue and unexpected weight change.  HENT:  Negative for congestion, sneezing and sore throat.   Respiratory: Negative.  Negative for cough, chest tightness, shortness of breath and wheezing.   Cardiovascular: Negative.  Negative for chest pain and palpitations.  Gastrointestinal:  Negative for abdominal pain, constipation, diarrhea, nausea and vomiting.  Musculoskeletal:  Positive for arthralgias, back pain and joint swelling (right knee). Negative for gait  problem and neck pain.  Skin:  Negative for rash.  Psychiatric/Behavioral:  Negative for behavioral problems (Depression), sleep disturbance and suicidal ideas. The patient is not nervous/anxious.     Vital Signs: BP 138/70 Comment: 143/72  Pulse 76   Temp 97.9 F (36.6 C)   Resp 16   Ht 5\' 9"  (1.753 m)   Wt (!) 310 lb (140.6 kg)   SpO2 98%   BMI 45.78 kg/m    Physical Exam Vitals reviewed.  Constitutional:      General: She is not in acute distress.    Appearance: Normal appearance. She is obese. She is not ill-appearing.  HENT:     Head: Normocephalic and atraumatic.  Eyes:     Pupils: Pupils are equal, round, and reactive to light.  Cardiovascular:     Rate and Rhythm: Normal rate and regular rhythm.  Pulmonary:     Effort: Pulmonary effort is normal. No respiratory distress.  Neurological:     Mental Status: She is alert and oriented to person, place, and time.  Psychiatric:        Mood and Affect: Mood normal.        Behavior: Behavior normal.        Assessment/Plan: 1. Type 2 diabetes mellitus with hyperglycemia, without long-term current use of insulin (HCC) A1c improving to 7.1, lost 10 lbs, continue  with current medications and interventions and diet. Follow up in 4 months to repeat A1c.  - POCT glycosylated hemoglobin (Hb A1C)  2. B12 deficiency Repeat B12 level - B12 and Folate Panel  3. Vitamin D deficiency Repeat vitamin D - Vitamin D (25 hydroxy)  4. Right sided sciatica Sees Dr. Holley Raring, epidural did not work, getting another epidural injection     General Counseling: Meisha verbalizes understanding of the findings of todays visit and agrees with plan of treatment. I have discussed any further diagnostic evaluation that may be needed or ordered today. We also reviewed her medications today. she has been encouraged to call the office with any questions or concerns that should arise related to todays visit.    Orders Placed This Encounter  Procedures   B12 and Folate Panel   Vitamin D (25 hydroxy)   POCT glycosylated hemoglobin (Hb A1C)    No orders of the defined types were placed in this encounter.   Return in about 4 months (around 01/12/2023) for F/U, Recheck A1C, Azzie Thiem PCP.   Total time spent:30 Minutes Time spent includes review of chart, medications, test results, and follow up plan with the patient.   [place] Controlled Substance Database was reviewed by me.  This patient was seen by Jonetta Osgood, FNP-C in collaboration with Dr. Clayborn Bigness as a part of collaborative care agreement.   Courtany Mcmurphy R. Valetta Fuller, MSN, FNP-C Internal medicine

## 2022-09-13 ENCOUNTER — Encounter: Payer: Self-pay | Admitting: Nurse Practitioner

## 2022-09-23 ENCOUNTER — Other Ambulatory Visit: Payer: Self-pay | Admitting: Nurse Practitioner

## 2022-09-23 DIAGNOSIS — Z760 Encounter for issue of repeat prescription: Secondary | ICD-10-CM

## 2022-10-02 ENCOUNTER — Encounter: Payer: Self-pay | Admitting: Student in an Organized Health Care Education/Training Program

## 2022-10-02 ENCOUNTER — Ambulatory Visit
Admission: RE | Admit: 2022-10-02 | Discharge: 2022-10-02 | Disposition: A | Discharge status: Home / Self Care | Payer: No Typology Code available for payment source | Source: Ambulatory Visit | Attending: Student in an Organized Health Care Education/Training Program | Admitting: Student in an Organized Health Care Education/Training Program

## 2022-10-02 ENCOUNTER — Ambulatory Visit
Payer: No Typology Code available for payment source | Attending: Student in an Organized Health Care Education/Training Program | Admitting: Student in an Organized Health Care Education/Training Program

## 2022-10-02 DIAGNOSIS — G8929 Other chronic pain: Secondary | ICD-10-CM

## 2022-10-02 DIAGNOSIS — G894 Chronic pain syndrome: Secondary | ICD-10-CM | POA: Insufficient documentation

## 2022-10-02 DIAGNOSIS — M5416 Radiculopathy, lumbar region: Secondary | ICD-10-CM | POA: Diagnosis not present

## 2022-10-02 MED ORDER — DEXAMETHASONE SODIUM PHOSPHATE 10 MG/ML IJ SOLN
INTRAMUSCULAR | Status: AC
  Filled 2022-10-02: qty 1

## 2022-10-02 MED ORDER — LIDOCAINE HCL (PF) 2 % IJ SOLN
INTRAMUSCULAR | Status: AC
  Filled 2022-10-02: qty 10

## 2022-10-02 MED ORDER — DEXAMETHASONE SODIUM PHOSPHATE 10 MG/ML IJ SOLN
10.0000 mg | Freq: Once | INTRAMUSCULAR | Status: AC
  Administered 2022-10-02: 10 mg

## 2022-10-02 MED ORDER — IOHEXOL 180 MG/ML  SOLN
INTRAMUSCULAR | Status: AC
  Filled 2022-10-02: qty 20

## 2022-10-02 MED ORDER — ROPIVACAINE HCL 2 MG/ML IJ SOLN
2.0000 mL | Freq: Once | INTRAMUSCULAR | Status: AC
  Administered 2022-10-02: 2 mL via EPIDURAL

## 2022-10-02 MED ORDER — ROPIVACAINE HCL 2 MG/ML IJ SOLN
INTRAMUSCULAR | Status: AC
  Filled 2022-10-02: qty 20

## 2022-10-02 MED ORDER — IOHEXOL 180 MG/ML  SOLN
10.0000 mL | Freq: Once | INTRAMUSCULAR | Status: AC
  Administered 2022-10-02: 10 mL via EPIDURAL

## 2022-10-02 MED ORDER — SODIUM CHLORIDE 0.9% FLUSH
2.0000 mL | Freq: Once | INTRAVENOUS | Status: AC
  Administered 2022-10-02: 2 mL

## 2022-10-02 MED ORDER — LIDOCAINE HCL 2 % IJ SOLN
20.0000 mL | Freq: Once | INTRAMUSCULAR | Status: AC
  Administered 2022-10-02: 200 mg

## 2022-10-02 MED ORDER — SODIUM CHLORIDE (PF) 0.9 % IJ SOLN
INTRAMUSCULAR | Status: AC
  Filled 2022-10-02: qty 10

## 2022-10-02 NOTE — Patient Instructions (Signed)

## 2022-10-02 NOTE — Progress Notes (Signed)
Safety precautions to be maintained throughout the outpatient stay will include: orient to surroundings, keep bed in low position, maintain call bell within reach at all times, provide assistance with transfer out of bed and ambulation.  

## 2022-10-02 NOTE — Progress Notes (Signed)
PROVIDER NOTE: Interpretation of information contained herein should be left to medically-trained personnel. Specific Clayton instructions are provided elsewhere under "Clayton Instructions" section of medical record. This document was created in part using STT-dictation technology, any transcriptional errors that may result from this process are unintentional.  [name] Type: Established DOB: 27-Apr-1961 MRN: [id number] PCP: Candace Kuster, NP  Service: Procedure DOS: 10/02/2022 Setting: Ambulatory Location: Ambulatory outpatient facility Delivery: Face-to-face Provider: Edward Jolly, MD Specialty: Interventional Pain Management Specialty designation: 09 Location: Outpatient facility Ref. Prov.: Edward Jolly, MD       Interventional Therapy   Procedure: Lumbar epidural steroid injection (LESI) (interlaminar) #1    Laterality: Right   Level:  L4-5 Level.  Imaging: Fluoroscopic guidance         Anesthesia: Local anesthesia (1-2% Lidocaine) DOS: 10/02/2022  Performed by: Edward Jolly, MD  Purpose: Diagnostic/Therapeutic Indications: Lumbar radicular pain of intraspinal etiology of more than 4 weeks that has failed to respond to conservative therapy and is severe enough to impact quality of life or function. 1. Chronic radicular lumbar pain (right)   2. Chronic pain syndrome    NAS-11 Pain score:   Pre-procedure: 6 /10   Post-procedure: 0-No pain/10      Position / Prep / Materials:  Position: Prone w/ head of Candace table raised (slight reverse trendelenburg) to facilitate breathing.  Prep solution: DuraPrep (Iodine Povacrylex [0.7% available iodine] and Isopropyl Alcohol, 74% w/w) Prep Area: Entire Posterior Lumbar Region from lower scapular tip down to mid buttocks area and from flank to flank. Materials:  Tray: Epidural tray Needle(s):  Type: Epidural needle (Tuohy) Gauge (G):  17 Length: Long (20cm) Qty: 1   Pre-op H&P Assessment:  Candace Clayton is a 62  y.o. (year old), female Clayton, seen today for interventional treatment. She  has a past surgical history that includes Cesarean section (1992); Finger surgery (Left, 2011); Hematoma evacuation (Left, 1999); Colonoscopy; Laparoscopic salpingo oophorectomy (Left, 04/02/2018); Colonoscopy with propofol (N/A, 03/14/2019); and Cholecystectomy (10/26/2020). Candace Clayton has a current medication list which includes Candace following prescription(s): albuterol, amlodipine, breztri aerosphere, buspirone, chlorthalidone, clobetasol ointment, desvenlafaxine, trulicity, trulicity, fluticasone, contour next test, hydrochlorothiazide, ipratropium-albuterol, metformin, methocarbamol, metoprolol succinate, microlet lancets, minoxidil, multiple vitamins-minerals, ondansetron, OVER Candace COUNTER MEDICATION, oxycodone, oxycodone-acetaminophen, pregabalin, rosuvastatin, vitamin d (ergocalciferol), oxycodone-acetaminophen, and oxycodone-acetaminophen. Her primarily concern today is Candace Back Pain (Lower right)  Initial Vital Signs:  Pulse/HCG Rate: 75ECG Heart Rate: 71 Temp: (!) 97.4 F (36.3 C) Resp: 16 BP: (!) 161/78 SpO2: 99 %  BMI: Estimated body mass index is 45.78 kg/m as calculated from Candace following:   Height as of this encounter: 5\' 9"  (1.753 m).   Weight as of this encounter: 310 lb (140.6 kg).  Risk Assessment: Allergies: Reviewed. She has No Known Allergies.  Allergy Precautions: None required Coagulopathies: Reviewed. None identified.  Blood-thinner therapy: None at this time Active Infection(s): Reviewed. None identified. Candace Clayton is afebrile  Site Confirmation: Candace Clayton was asked to confirm Candace procedure and laterality before marking Candace site Procedure checklist: Completed Consent: Before Candace procedure and under Candace influence of no sedative(s), amnesic(s), or anxiolytics, Candace Clayton was informed of Candace treatment options, risks and possible complications. To fulfill our ethical and legal obligations,  as recommended by Candace [employer]'s Code of Ethics, I have informed Candace Clayton of my clinical impression; Candace nature and purpose of Candace treatment or procedure; Candace risks, benefits, and possible complications of Candace intervention; Candace alternatives, including doing nothing; Candace  risk(s) and benefit(s) of Candace alternative treatment(s) or procedure(s); and Candace risk(s) and benefit(s) of doing nothing. Candace Clayton was provided information about Candace general risks and possible complications associated with Candace procedure. These may include, but are not limited to: failure to achieve desired goals, infection, bleeding, organ or nerve damage, allergic reactions, paralysis, and death. In addition, Candace Clayton was informed of those risks and complications associated to Spine-related procedures, such as failure to decrease pain; infection (i.e.: Meningitis, epidural or intraspinal abscess); bleeding (i.e.: epidural hematoma, subarachnoid hemorrhage, or any other type of intraspinal or peri-dural bleeding); organ or nerve damage (i.e.: Any type of peripheral nerve, nerve root, or spinal cord injury) with subsequent damage to sensory, motor, and/or autonomic systems, resulting in permanent pain, numbness, and/or weakness of one or several areas of Candace body; allergic reactions; (i.e.: anaphylactic reaction); and/or death. Furthermore, Candace Clayton was informed of those risks and complications associated with Candace medications. These include, but are not limited to: allergic reactions (i.e.: anaphylactic or anaphylactoid reaction(s)); adrenal axis suppression; blood sugar elevation that in diabetics may result in ketoacidosis or comma; water retention that in patients with history of congestive heart failure may result in shortness of breath, pulmonary edema, and decompensation with resultant heart failure; weight gain; swelling or edema; medication-induced neural toxicity; particulate matter embolism and blood vessel  occlusion with resultant organ, and/or nervous system infarction; and/or aseptic necrosis of one or more joints. Finally, Candace Clayton was informed that Medicine is not an exact science; therefore, there is also Candace possibility of unforeseen or unpredictable risks and/or possible complications that may result in a catastrophic outcome. Candace Clayton indicated having understood very clearly. We have given Candace Clayton no guarantees and we have made no promises. Enough time was given to Candace Clayton to ask questions, all of which were answered to Candace Clayton's satisfaction. Candace Clayton has indicated that she wanted to continue with Candace procedure. Attestation: I, Candace ordering provider, attest that I have discussed with Candace Clayton Candace benefits, risks, side-effects, alternatives, likelihood of achieving goals, and potential problems during recovery for Candace procedure that I have provided informed consent. Date  Time: 10/02/2022 10:41 AM   Pre-Procedure Preparation:  Monitoring: As per clinic protocol. Respiration, ETCO2, SpO2, BP, heart rate and rhythm monitor placed and checked for adequate function Safety Precautions: Clayton was assessed for positional comfort and pressure points before starting Candace procedure. Time-out: I initiated and conducted Candace "Time-out" before starting Candace procedure, as per protocol. Candace Clayton was asked to participate by confirming Candace accuracy of Candace "Time Out" information. Verification of Candace correct person, site, and procedure were performed and confirmed by me, Candace nursing staff, and Candace Clayton. "Time-out" conducted as per Joint Commission's Universal Protocol (UP.01.01.01). Time: 1109 Start Time: 1109 hrs.  Description/Narrative of Procedure:          Target: Epidural space via interlaminar opening, initially targeting Candace lower laminar border of Candace superior vertebral body. Region: Lumbar Approach: Percutaneous paravertebral  Rationale (medical necessity): procedure needed  and proper for Candace diagnosis and/or treatment of Candace Clayton's medical symptoms and needs. Procedural Technique Safety Precautions: Aspiration looking for blood return was conducted prior to all injections. At no point did we inject any substances, as a needle was being advanced. No attempts were made at seeking any paresthesias. Safe injection practices and needle disposal techniques used. Medications properly checked for expiration dates. SDV (single dose vial) medications used. Description of Candace Procedure: Protocol guidelines were followed. Candace procedure needle  was introduced through Candace skin, ipsilateral to Candace reported pain, and advanced to Candace target area. Bone was contacted and Candace needle walked caudad, until Candace lamina was cleared. Candace epidural space was identified using "loss-of-resistance technique" with 2-3 ml of PF-NaCl (0.9% NSS), in a 5cc LOR glass syringe.  6 cc solution made of 3 cc of preservative-free saline, 2 cc of 0.2% ropivacaine, 1 cc of Decadron 10 mg/cc.   Vitals:   10/02/22 1043 10/02/22 1104 10/02/22 1108 10/02/22 1113  BP: (!) 161/78 (!) 147/71 (!) 145/77 134/79  Pulse: 75     Resp: 16 (!) 27 (!) 22 (!) 21  Temp: (!) 97.4 F (36.3 C)     SpO2: 99% 100% 97% 96%  Weight: (!) 310 lb (140.6 kg)     Height: 5\' 9"  (1.753 m)       Start Time: 1109 hrs. End Time: 1112 hrs.  Imaging Guidance (Spinal):          Type of Imaging Technique: Fluoroscopy Guidance (Spinal) Indication(s): Assistance in needle guidance and placement for procedures requiring needle placement in or near specific anatomical locations not easily accessible without such assistance. Exposure Time: Please see nurses notes. Contrast: Before injecting any contrast, we confirmed that Candace Clayton did not have an allergy to iodine, shellfish, or radiological contrast. Once satisfactory needle placement was completed at Candace desired level, radiological contrast was injected. Contrast injected under live  fluoroscopy. No contrast complications. See chart for type and volume of contrast used. Fluoroscopic Guidance: I was personally present during Candace use of fluoroscopy. "Tunnel Vision Technique" used to obtain Candace best possible view of Candace target area. Parallax error corrected before commencing Candace procedure. "Direction-depth-direction" technique used to introduce Candace needle under continuous pulsed fluoroscopy. Once target was reached, antero-posterior, oblique, and lateral fluoroscopic projection used confirm needle placement in all planes. Images permanently stored in EMR. Interpretation: I personally interpreted Candace imaging intraoperatively. Adequate needle placement confirmed in multiple planes. Appropriate spread of contrast into desired area was observed. No evidence of afferent or efferent intravascular uptake. No intrathecal or subarachnoid spread observed. Permanent images saved into Candace Clayton's record.  Antibiotic Prophylaxis:   Anti-infectives (From admission, onward)    None      Indication(s): None identified  Post-operative Assessment:  Post-procedure Vital Signs:  Pulse/HCG Rate: 7574 Temp: (!) 97.4 F (36.3 C) Resp: (!) 21 BP: 134/79 SpO2: 96 %  EBL: None  Complications: No immediate post-treatment complications observed by team, or reported by Clayton.  Note: Candace Clayton tolerated Candace entire procedure well. A repeat set of vitals were taken after Candace procedure and Candace Clayton was kept under observation following institutional policy, for this type of procedure. Post-procedural neurological assessment was performed, showing return to baseline, prior to discharge. Candace Clayton was provided with post-procedure discharge instructions, including a section on how to identify potential problems. Should any problems arise concerning this procedure, Candace Clayton was given instructions to immediately contact us, at any time, without hesitation. In any case, we plan to contact Candace  Clayton by telephone for a follow-up status report regarding this interventional procedure.  Comments:  No additional relevant information.  Plan of Care (POC)  Orders:  Orders Placed This Encounter  Procedures   DG PAIN CLINIC C-ARM 1-60 MIN NO REPORT    Intraoperative interpretation by procedural physician at [facility].    Standing Status:   Standing    Number of Occurrences:   1    Order Specific Question:  Reason for exam:    Answer:   Assistance in needle guidance and placement for procedures requiring needle placement in or near specific anatomical locations not easily accessible without such assistance.   Chronic Opioid Analgesic:  Percocet 5 mg 3 times daily as needed, quantity 90/month; MME equals 22.5    Medications ordered for procedure: Meds ordered this encounter  Medications   iohexol (OMNIPAQUE) 180 MG/ML injection 10 mL    Must be Myelogram-compatible. If not available, you may substitute with a water-soluble, non-ionic, hypoallergenic, myelogram-compatible radiological contrast medium.   lidocaine (XYLOCAINE) 2 % (with pres) injection 400 mg   sodium chloride flush (NS) 0.9 % injection 2 mL   ropivacaine (PF) 2 mg/mL (0.2%) (NAROPIN) injection 2 mL   dexamethasone (DECADRON) injection 10 mg   Medications administered: We administered iohexol, lidocaine, sodium chloride flush, ropivacaine (PF) 2 mg/mL (0.2%), and dexamethasone.  See Candace medical record for exact dosing, route, and time of administration.  Follow-up plan:   Return keep scheduled appt..       Recent Visits Date Type Provider Dept  08/23/22 Office Visit Edward Jolly, MD [facility]  07/26/22 Procedure visit Edward Jolly, MD [facility]  07/11/22 Office Visit Edward Jolly, MD [facility]  Showing recent visits within past 90 days and meeting all other requirements Today's Visits Date Type Provider Dept  10/02/22 Procedure visit Edward Jolly, MD  [facility]  Showing today's visits and meeting all other requirements Future Appointments Date Type Provider Dept  10/24/22 Appointment Edward Jolly, MD [facility]  Showing future appointments within next 90 days and meeting all other requirements  Disposition: Discharge home  Discharge (Date  Time): 10/02/2022;   hrs.   Primary Care Physician: Candace Kuster, NP Location: [facility] Note by: Edward Jolly, MD (TTS technology used. I apologize for any typographical errors that were not detected and corrected.) Date: 10/02/2022; Time: 11:16 AM  Disclaimer:  Medicine is not an Visual merchandiser. Candace only guarantee in medicine is that nothing is guaranteed. It is important to note that Candace decision to proceed with this intervention was based on Candace information collected from Candace Clayton. Candace Data and conclusions were drawn from Candace Clayton's questionnaire, Candace interview, and Candace physical examination. Because Candace information was provided in large part by Candace Clayton, it cannot be guaranteed that it has not been purposely or unconsciously manipulated. Every effort has been made to obtain as much relevant data as possible for this evaluation. It is important to note that Candace conclusions that lead to this procedure are derived in large part from Candace available data. Always take into account that Candace treatment will also be dependent on availability of resources and existing treatment guidelines, considered by other Pain Management Practitioners as being common knowledge and practice, at Candace time of Candace intervention. For Medico-Legal purposes, it is also important to point out that variation in procedural techniques and pharmacological choices are Candace acceptable norm. Candace indications, contraindications, technique, and results of Candace above procedure should only be interpreted and judged by a Board-Certified Interventional Pain Specialist with extensive familiarity  and expertise in Candace same exact procedure and technique.

## 2022-10-03 LAB — VITAMIN D 25 HYDROXY (VIT D DEFICIENCY, FRACTURES): Vit D, 25-Hydroxy: 35.6 ng/mL (ref 30.0–100.0)

## 2022-10-03 LAB — B12 AND FOLATE PANEL
Folate: 6.6 ng/mL (ref >3.0)
Vitamin B-12: 135 pg/mL — ABNORMAL LOW (ref 232–1245)

## 2022-10-16 NOTE — Progress Notes (Signed)
Please call patient and let her know that her B12 is very low. We need to restart B12 injections, weekly x5 weeks, then monthly fir 6 months, please have her schedule a nurse visit.

## 2022-10-17 ENCOUNTER — Telehealth: Payer: Self-pay

## 2022-10-17 NOTE — Telephone Encounter (Signed)
Left message for patient to give office a call.

## 2022-10-19 ENCOUNTER — Other Ambulatory Visit: Payer: Self-pay | Admitting: Nurse Practitioner

## 2022-10-19 DIAGNOSIS — Z760 Encounter for issue of repeat prescription: Secondary | ICD-10-CM

## 2022-10-24 ENCOUNTER — Encounter: Payer: Self-pay | Admitting: Student in an Organized Health Care Education/Training Program

## 2022-10-24 ENCOUNTER — Ambulatory Visit
Payer: No Typology Code available for payment source | Attending: Student in an Organized Health Care Education/Training Program | Admitting: Student in an Organized Health Care Education/Training Program

## 2022-10-24 ENCOUNTER — Ambulatory Visit (INDEPENDENT_AMBULATORY_CARE_PROVIDER_SITE_OTHER): Payer: No Typology Code available for payment source

## 2022-10-24 DIAGNOSIS — M47816 Spondylosis without myelopathy or radiculopathy, lumbar region: Secondary | ICD-10-CM | POA: Insufficient documentation

## 2022-10-24 DIAGNOSIS — E538 Deficiency of other specified B group vitamins: Secondary | ICD-10-CM

## 2022-10-24 DIAGNOSIS — M5136 Other intervertebral disc degeneration, lumbar region: Secondary | ICD-10-CM

## 2022-10-24 DIAGNOSIS — G894 Chronic pain syndrome: Secondary | ICD-10-CM | POA: Diagnosis not present

## 2022-10-24 MED ORDER — OXYCODONE-ACETAMINOPHEN 5-325 MG PO TABS
1.0000 | ORAL_TABLET | Freq: Three times a day (TID) | ORAL | 0 refills | Status: DC | PRN
Start: 2022-12-29 — End: 2023-01-25

## 2022-10-24 MED ORDER — CYANOCOBALAMIN 1000 MCG/ML IJ SOLN
1000.0000 ug | Freq: Once | INTRAMUSCULAR | Status: AC
Start: 2022-10-24 — End: 2022-10-24
  Administered 2022-10-24: 1000 ug via INTRAMUSCULAR

## 2022-10-24 MED ORDER — OXYCODONE-ACETAMINOPHEN 5-325 MG PO TABS
1.0000 | ORAL_TABLET | Freq: Three times a day (TID) | ORAL | 0 refills | Status: DC | PRN
Start: 2022-10-30 — End: 2023-01-25

## 2022-10-24 MED ORDER — OXYCODONE-ACETAMINOPHEN 5-325 MG PO TABS
1.0000 | ORAL_TABLET | Freq: Three times a day (TID) | ORAL | 0 refills | Status: DC | PRN
Start: 2022-11-29 — End: 2023-01-25

## 2022-10-24 NOTE — Progress Notes (Signed)
Nursing Pain Medication Assessment:  Safety precautions to be maintained throughout the outpatient stay will include: orient to surroundings, keep bed in low position, maintain call bell within reach at all times, provide assistance with transfer out of bed and ambulation.  Medication Inspection Compliance: Pill count conducted under aseptic conditions, in front of the patient. Neither the pills nor the bottle was removed from the patient's sight at any time. Once count was completed pills were immediately returned to the patient in their original bottle.  Medication: Oxycodone/APAP Pill/Patch Count:  23 of 90 pills remain Pill/Patch Appearance: Markings consistent with prescribed medication Bottle Appearance: Standard pharmacy container. Clearly labeled. Filled Date: 04 / 05 / 2024 Last Medication intake:  Today

## 2022-10-24 NOTE — Progress Notes (Signed)
PROVIDER NOTE: Information contained herein reflects review and annotations entered in association with encounter. Interpretation of such information and data should be left to medically-trained personnel. Information provided to patient can be located elsewhere in the medical record under "Patient Instructions". Document created using STT-dictation technology, any transcriptional errors that may result from process are unintentional.    Patient: Candace Clayton  Service Category: E/M  Provider: Edward Jolly, MD  DOB: [birth year]  DOS: 10/24/2022  Referring Provider: Sallyanne Kuster, NP  MRN: [id number]  Specialty: Interventional Pain Management  PCP: Sallyanne Kuster, NP  Type: Established Patient  Setting: Ambulatory outpatient    Location: Office  Delivery: Face-to-face     HPI  [name], a 62 y.o. year old female, is here today because of her No primary diagnosis found.. [name] primary complain today is Back Pain (Lumbar right is worse ) Last encounter: My last encounter with her was on 10/02/22 Pertinent problems: [name] has Major depressive disorder, recurrent episode, moderate (HCC); Morbid obesity (HCC); Low back pain with sciatica; OSA on CPAP; Lumbar degenerative disc disease; Lumbar facet arthropathy (R>L); Lumbar spondylosis; Chronic pain syndrome; and Type 2 diabetes mellitus with hyperglycemia, without long-term current use of insulin (HCC) on their pertinent problem list. Pain Assessment: Severity of Chronic pain is reported as a 4 /10. Location: Back Lower, Right/down right thigh to the knee. knee is starting to buckle on her again. Onset: More than a month ago. Quality: Discomfort, Constant. Timing: Constant. Modifying factor(s): rest, stay off her feet. Vitals:  height is 5\' 9"  (1.753 m) and weight is 310 lb (140.6 kg) (abnormal). Her temporal temperature is 97.3 F (36.3 C) (abnormal). Her blood pressure is 133/59 (abnormal) and her pulse is 81. Her respiration is  16 and oxygen saturation is 100%.  BMI: Estimated body mass index is 45.78 kg/m as calculated from the following:   Height as of this encounter: 5\' 9"  (1.753 m).   Weight as of this encounter: 310 lb (140.6 kg).  Reason for encounter: both, medication management and post-procedure evaluation and assessment.    Post-procedure evaluation   Procedure: Lumbar epidural steroid injection (LESI) (interlaminar) #1    Laterality: Right   Level:  L4-5 Level.  Imaging: Fluoroscopic guidance         Anesthesia: Local anesthesia (1-2% Lidocaine) DOS: 10/02/2022  Performed by: Edward Jolly, MD  Purpose: Diagnostic/Therapeutic Indications: Lumbar radicular pain of intraspinal etiology of more than 4 weeks that has failed to respond to conservative therapy and is severe enough to impact quality of life or function. 1. Chronic radicular lumbar pain (right)   2. Chronic pain syndrome    NAS-11 Pain score:   Pre-procedure: 6 /10   Post-procedure: 0-No pain/10      Effectiveness:  Initial hour after procedure: 0 % Subsequent 4-6 hours post-procedure: 0% Analgesia past initial 6 hours: 60 %  Ongoing improvement:  Analgesic:  75% Function: [name] reports improvement in function ROM: [name] reports improvement in ROM    Pharmacotherapy Assessment  Analgesic: Percocet 5 mg 3 times daily as needed, quantity 90/month; MME equals 22.5    Monitoring: [place] PMP: PDMP reviewed during this encounter.       Pharmacotherapy: No side-effects or adverse reactions reported. Compliance: No problems identified. Effectiveness: Clinically acceptable.  Vernie Ammons, RN  10/24/2022  8:15 AM  Sign when Signing Visit Nursing Pain Medication Assessment:  Safety precautions to be maintained throughout the outpatient stay will include: orient  to surroundings, keep bed in low position, maintain call bell within reach at all times, provide assistance with transfer out of bed and ambulation.   Medication Inspection Compliance: Pill count conducted under aseptic conditions, in front of the patient. Neither the pills nor the bottle was removed from the patient's sight at any time. Once count was completed pills were immediately returned to the patient in their original bottle.  Medication: Oxycodone/APAP Pill/Patch Count:  23 of 90 pills remain Pill/Patch Appearance: Markings consistent with prescribed medication Bottle Appearance: Standard pharmacy container. Clearly labeled. Filled Date: 04 / 05 / 2024 Last Medication intake:  Today    No results found for: "CBDTHCR" No results found for: "D8THCCBX" No results found for: "D9THCCBX"  UDS:  Summary  Date Value Ref Range Status  01/05/2022 Note  Final    Comment:    ==================================================================== [employer] (MW) ==================================================================== Test                             Result       Flag       Units  Drug Present and Declared for Prescription Verification   Oxycodone                      3048         EXPECTED   ng/mg creat   Oxymorphone                    782          EXPECTED   ng/mg creat   Noroxycodone                   1433         EXPECTED   ng/mg creat   Noroxymorphone                 232          EXPECTED   ng/mg creat    Sources of oxycodone are scheduled prescription medications.    Oxymorphone, noroxycodone, and noroxymorphone are expected    metabolites of oxycodone. Oxymorphone is also available as a    scheduled prescription medication.  ==================================================================== Test                      Result    Flag   Units      Ref Range   Creatinine              66               mg/dL      >=91 ==================================================================== Declared Medications:  The flagging and interpretation on this report are based on the  following declared medications.   Unexpected results may arise from  inaccuracies in the declared medications.   **Note: The testing scope of this panel includes these medications:   Oxycodone (Percocet)   **Note: The testing scope of this panel does not include the  following reported medications:   Acetaminophen (Percocet)  Albuterol (Ventolin HFA)  Albuterol (Duoneb)  Amlodipine (Norvasc)  Budesonide (Breztri Aerosphere)  Buspirone (Buspar)  Chlorthalidone (Hygroton)  Desvenlafaxine (Pristiq)  Docusate (Stool Softener)  Dulaglutide (Trulicity)  Fluticasone (Flonase)  Formoterol (Breztri Aerosphere)  Glycopyrrolate (Breztri Aerosphere)  Hydrochlorothiazide (Hydrodiuril)  Ipratropium (Duoneb)  Metformin (Glucophage)  Methocarbamol (Robaxin)  Metoprolol (Toprol)  Minoxidil (Loniten)  Multivitamin  Pregabalin (Lyrica)  Rosuvastatin (Crestor)  Vitamin D2 (Drisdol) ==================================================================== For clinical consultation, please call [phone]. ====================================================================       ROS  Constitutional: Denies any fever or chills Gastrointestinal: No reported hemesis, hematochezia, vomiting, or acute GI distress Musculoskeletal: mild right knee pain Neurological: No reported episodes of acute onset apraxia, aphasia, dysarthria, agnosia, amnesia, paralysis, loss of coordination, or loss of consciousness  Medication Review  Budeson-Glycopyrrol-Formoterol, Dulaglutide, Microlet Lancets, Multiple Vitamins-Minerals, OVER THE COUNTER MEDICATION, Vitamin D (Ergocalciferol), albuterol, amLODipine, busPIRone, chlorthalidone, clobetasol ointment, desvenlafaxine, fluticasone, glucose blood, hydrochlorothiazide, ipratropium-albuterol, metFORMIN, methocarbamol, metoprolol succinate, minoxidil, ondansetron, oxyCODONE, oxyCODONE-acetaminophen, pregabalin, and rosuvastatin  History Review  Allergy: [name] has No Known Allergies. Drug:  [name]  reports no history of drug use. Alcohol:  reports no history of alcohol use. Tobacco:  reports that she has never smoked. She has never used smokeless tobacco. Social: [name]  reports that she has never smoked. She has never used smokeless tobacco. She reports that she does not drink alcohol and does not use drugs. Medical:  has a past medical history of Anginal pain (HCC), Anxiety, Cholecystolithiasis, Depression, Diabetes mellitus without complication (HCC), GERD (gastroesophageal reflux disease), History of C-section (1992), Hypertension, and Sleep apnea. Surgical: [name]  has a past surgical history that includes Cesarean section (1992); Finger surgery (Left, 2011); Hematoma evacuation (Left, 1999); Colonoscopy; Laparoscopic salpingo oophorectomy (Left, 04/02/2018); Colonoscopy with propofol (N/A, 03/14/2019); and Cholecystectomy (10/26/2020). Family: family history includes Brain cancer in her mother; Breast cancer (age of onset: 45) in her mother; Cancer - Colon in her mother; Cancer - Other in her mother; Colon cancer in her brother and mother; Heart Problems in her brother and father.  Laboratory Chemistry Profile   Renal Lab Results  Component Value Date   BUN 14 02/21/2022   CREATININE 0.76 02/21/2022   BCR 16 12/02/2021   GFRAA 106 02/23/2020   GFRNONAA >60 02/21/2022    Hepatic Lab Results  Component Value Date   AST 40 02/21/2022   ALT 33 02/21/2022   ALBUMIN 4.4 02/21/2022   ALKPHOS 95 02/21/2022   LIPASE 26 02/21/2022    Electrolytes Lab Results  Component Value Date   NA 137 02/21/2022   K 4.0 02/21/2022   CL 102 02/21/2022   CALCIUM 9.5 02/21/2022    Bone Lab Results  Component Value Date   VD25OH 35.6 10/02/2022    Inflammation (CRP: Acute Phase) (ESR: Chronic Phase) Lab Results  Component Value Date   CRP 2 05/24/2021   ESRSEDRATE 12 05/24/2021         Note: Above Lab results reviewed.  Physical Exam  General appearance: Well  nourished, well developed, and well hydrated. In no apparent acute distress Mental status: Alert, oriented x 3 (person, place, & time)       Respiratory: No evidence of acute respiratory distress Eyes: PERLA Vitals: BP (!) 133/59 (BP Location: Right Arm, Patient Position: Sitting, Cuff Size: Large)   Pulse 81   Temp (!) 97.3 F (36.3 C) (Temporal)   Resp 16   Ht 5\' 9"  (1.753 m)   Wt (!) 310 lb (140.6 kg)   SpO2 100%   BMI 45.78 kg/m  BMI: Estimated body mass index is 45.78 kg/m as calculated from the following:   Height as of this encounter: 5\' 9"  (1.753 m).   Weight as of this encounter: 310 lb (140.6 kg). Ideal: Ideal body weight: 66.2 kg (145 lb 15.1 oz) Adjusted ideal body weight: 96 kg (211 lb 9.1 oz)  Lumbar Spine  Area Exam  Skin & Axial Inspection: No masses, redness, or swelling Alignment: Symmetrical Functional ROM: Pain restricted ROM affecting primarily the right Stability: No instability detected Muscle Tone/Strength: Functionally intact. No obvious neuro-muscular anomalies detected. Sensory (Neurological): Dermatomal pain pattern right   Right knee pain, related to right knee OA Pain with weight bearing  Assessment   Diagnosis Status  1. Chronic pain syndrome   2. Lumbar facet arthropathy    3. Lumbar degenerative disc disease     Persistent Improved Controlled   Updated Problems: No problems updated.  Plan of Care  1. Chronic pain syndrome - oxyCODONE-acetaminophen (PERCOCET/ROXICET) 5-325 MG tablet; Take 1 tablet by mouth every 8 (eight) hours as needed for severe pain.  Dispense: 90 tablet; Refill: 0 - oxyCODONE-acetaminophen (PERCOCET/ROXICET) 5-325 MG tablet; Take 1 tablet by mouth every 8 (eight) hours as needed for severe pain.  Dispense: 90 tablet; Refill: 0 - oxyCODONE-acetaminophen (PERCOCET/ROXICET) 5-325 MG tablet; Take 1 tablet by mouth every 8 (eight) hours as needed for severe pain.  Dispense: 90 tablet; Refill: 0  2. Lumbar facet  arthropathy  - oxyCODONE-acetaminophen (PERCOCET/ROXICET) 5-325 MG tablet; Take 1 tablet by mouth every 8 (eight) hours as needed for severe pain.  Dispense: 90 tablet; Refill: 0 - oxyCODONE-acetaminophen (PERCOCET/ROXICET) 5-325 MG tablet; Take 1 tablet by mouth every 8 (eight) hours as needed for severe pain.  Dispense: 90 tablet; Refill: 0 - oxyCODONE-acetaminophen (PERCOCET/ROXICET) 5-325 MG tablet; Take 1 tablet by mouth every 8 (eight) hours as needed for severe pain.  Dispense: 90 tablet; Refill: 0  3. Lumbar degenerative disc disease - oxyCODONE-acetaminophen (PERCOCET/ROXICET) 5-325 MG tablet; Take 1 tablet by mouth every 8 (eight) hours as needed for severe pain.  Dispense: 90 tablet; Refill: 0 - oxyCODONE-acetaminophen (PERCOCET/ROXICET) 5-325 MG tablet; Take 1 tablet by mouth every 8 (eight) hours as needed for severe pain.  Dispense: 90 tablet; Refill: 0 - oxyCODONE-acetaminophen (PERCOCET/ROXICET) 5-325 MG tablet; Take 1 tablet by mouth every 8 (eight) hours as needed for severe pain.  Dispense: 90 tablet; Refill: 0  -Continue Lyrica as prescribed  -Follow up for right knee injection for knee OA as needed -Continue to monitor lumbar spine and radiating leg pain, consider repeating L-Esi in future if radicular pain flare  Orders:  No orders of the defined types were placed in this encounter.  Follow-up plan:   Return in about 3 months (around 01/25/2023) for Medication Management, in person.      Recent Visits Date Type Provider Dept  10/02/22 Procedure visit Edward Jolly, MD [facility]  08/23/22 Office Visit Edward Jolly, MD [facility]  07/26/22 Procedure visit Edward Jolly, MD [facility]  Showing recent visits within past 90 days and meeting all other requirements Today's Visits Date Type Provider Dept  10/24/22 Office Visit Edward Jolly, MD [facility]  Showing today's visits and meeting all other requirements Future  Appointments No visits were found meeting these conditions. Showing future appointments within next 90 days and meeting all other requirements  I discussed the assessment and treatment plan with the patient. The patient was provided an opportunity to ask questions and all were answered. The patient agreed with the plan and demonstrated an understanding of the instructions.  Patient advised to call back or seek an in-person evaluation if the symptoms or condition worsens.  Duration of encounter: .  Total time on encounter, as per AMA guidelines included both the face-to-face and non-face-to-face time personally spent by the physician and/or other  qualified health care professional(s) on the day of the encounter (includes time in activities that require the physician or other qualified health care professional and does not include time in activities normally performed by clinical staff). Physician's time may include the following activities when performed: Preparing to see the patient (e.g., pre-charting review of records, searching for previously ordered imaging, lab work, and nerve conduction tests) Review of prior analgesic pharmacotherapies. Reviewing PMP Interpreting ordered tests (e.g., lab work, imaging, nerve conduction tests) Performing post-procedure evaluations, including interpretation of diagnostic procedures Obtaining and/or reviewing separately obtained history Performing a medically appropriate examination and/or evaluation Counseling and educating the patient/family/caregiver Ordering medications, tests, or procedures Referring and communicating with other health care professionals (when not separately reported) Documenting clinical information in the electronic or other health record Independently interpreting results (not separately reported) and communicating results to the patient/ family/caregiver Care coordination (not separately reported)  Note by: Edward Jolly,  MD Date: 10/24/2022; Time: 8:51 AM

## 2022-10-24 NOTE — Progress Notes (Signed)
Patient came to office to get her labs results

## 2022-10-31 ENCOUNTER — Ambulatory Visit (INDEPENDENT_AMBULATORY_CARE_PROVIDER_SITE_OTHER): Payer: No Typology Code available for payment source

## 2022-10-31 DIAGNOSIS — E538 Deficiency of other specified B group vitamins: Secondary | ICD-10-CM | POA: Diagnosis not present

## 2022-10-31 MED ORDER — CYANOCOBALAMIN 1000 MCG/ML IJ SOLN
1000.0000 ug | Freq: Once | INTRAMUSCULAR | Status: AC
Start: 2022-10-31 — End: 2022-10-31
  Administered 2022-10-31: 1000 ug via INTRAMUSCULAR

## 2022-11-07 ENCOUNTER — Ambulatory Visit (INDEPENDENT_AMBULATORY_CARE_PROVIDER_SITE_OTHER): Payer: No Typology Code available for payment source

## 2022-11-07 DIAGNOSIS — E538 Deficiency of other specified B group vitamins: Secondary | ICD-10-CM

## 2022-11-07 MED ORDER — CYANOCOBALAMIN 1000 MCG/ML IJ SOLN
1000.0000 ug | Freq: Once | INTRAMUSCULAR | Status: AC
Start: 2022-11-07 — End: 2022-11-07
  Administered 2022-11-07: 1000 ug via INTRAMUSCULAR

## 2022-11-13 ENCOUNTER — Other Ambulatory Visit: Payer: Self-pay | Admitting: Nurse Practitioner

## 2022-11-13 DIAGNOSIS — F411 Generalized anxiety disorder: Secondary | ICD-10-CM

## 2022-11-21 ENCOUNTER — Other Ambulatory Visit: Payer: Self-pay | Admitting: Nurse Practitioner

## 2022-11-21 DIAGNOSIS — Z760 Encounter for issue of repeat prescription: Secondary | ICD-10-CM

## 2022-11-22 ENCOUNTER — Other Ambulatory Visit: Payer: Self-pay | Admitting: Nurse Practitioner

## 2022-11-22 DIAGNOSIS — E1165 Type 2 diabetes mellitus with hyperglycemia: Secondary | ICD-10-CM

## 2022-11-22 DIAGNOSIS — I10 Essential (primary) hypertension: Secondary | ICD-10-CM

## 2022-12-04 ENCOUNTER — Encounter: Payer: Self-pay | Admitting: Nurse Practitioner

## 2022-12-04 ENCOUNTER — Ambulatory Visit (INDEPENDENT_AMBULATORY_CARE_PROVIDER_SITE_OTHER): Payer: No Typology Code available for payment source | Admitting: Nurse Practitioner

## 2022-12-04 VITALS — BP 129/76 | HR 60 | Temp 98.3°F | Resp 16 | Ht 69.0 in | Wt 319.4 lb

## 2022-12-04 DIAGNOSIS — Z1231 Encounter for screening mammogram for malignant neoplasm of breast: Secondary | ICD-10-CM

## 2022-12-04 DIAGNOSIS — E1165 Type 2 diabetes mellitus with hyperglycemia: Secondary | ICD-10-CM

## 2022-12-04 DIAGNOSIS — E782 Mixed hyperlipidemia: Secondary | ICD-10-CM

## 2022-12-04 DIAGNOSIS — E538 Deficiency of other specified B group vitamins: Secondary | ICD-10-CM

## 2022-12-04 DIAGNOSIS — Z0001 Encounter for general adult medical examination with abnormal findings: Secondary | ICD-10-CM

## 2022-12-04 DIAGNOSIS — R300 Dysuria: Secondary | ICD-10-CM | POA: Diagnosis not present

## 2022-12-04 MED ORDER — CYANOCOBALAMIN 1000 MCG/ML IJ SOLN
1000.0000 ug | Freq: Once | INTRAMUSCULAR | Status: AC
Start: 2022-12-04 — End: 2022-12-04
  Administered 2022-12-04: 1000 ug via INTRAMUSCULAR

## 2022-12-04 MED ORDER — OZEMPIC (1 MG/DOSE) 4 MG/3ML ~~LOC~~ SOPN
1.0000 mg | PEN_INJECTOR | SUBCUTANEOUS | 5 refills | Status: DC
Start: 2022-12-04 — End: 2023-04-05

## 2022-12-04 NOTE — Progress Notes (Signed)
[facility] [address] [place], [place] [address]  Internal MEDICINE  Office Visit Note  Patient Name: Candace Clayton  [id number]  [id number]  Date of Service: 12/04/2022  Chief Complaint  Patient presents with   Depression   Diabetes   Gastroesophageal Reflux   Hypertension   Annual Exam    HPI Charnice presents for an annual well visit and physical exam.  Well-appearing 62 y.o. female with hypertension, allergic rhinitis, OSA, lumbar spondylosis, overactive bladder, chronic pain, insomnia, GAD, high cholesterol, and depression Routine CRC screening: due next year Routine mammogram: due now  Eye exam: recently checked foot exam: done today  Labs: some were done in April, but need full panel now.  New or worsening pain: chronic pain, sees pain management Reviewed medications, updated list Cannot get trulicity in a 20 mile radius for the past couple of months.      Current Medication: Outpatient Encounter Medications as of 12/04/2022  Medication Sig   albuterol (VENTOLIN HFA) 108 (90 Base) MCG/ACT inhaler TAKE 2 PUFFS BY MOUTH EVERY 6 HOURS AS NEEDED FOR WHEEZE OR SHORTNESS OF BREATH   amLODipine (NORVASC) 10 MG tablet TAKE 1 TABLET BY MOUTH EVERY DAY   Budeson-Glycopyrrol-Formoterol (BREZTRI AEROSPHERE) 160-9-4.8 MCG/ACT AERO Inhale 2 puffs into the lungs 2 (two) times daily.   busPIRone (BUSPAR) 15 MG tablet TAKE 1 TABLET (15 MG TOTAL) BY MOUTH 3 (THREE) TIMES DAILY FOR ANXIETY.   chlorthalidone (HYGROTON) 25 MG tablet TAKE 1 TABLET (25 MG TOTAL) BY MOUTH DAILY.   clobetasol ointment (TEMOVATE) 0.05 % Apply 1 Application topically daily. To affected area until resolved.   desvenlafaxine (PRISTIQ) 100 MG 24 hr tablet TAKE 1 TABLET BY MOUTH EVERY DAY   fluticasone (FLONASE) 50 MCG/ACT nasal spray Place 2 sprays into both nostrils daily.   glucose blood (CONTOUR NEXT TEST) test strip 1 each by Other route daily. DX E11.65   hydrochlorothiazide (HYDRODIURIL) 25  MG tablet TAKE 1 TABLET (25 MG TOTAL) BY MOUTH DAILY.   ipratropium-albuterol (DUONEB) 0.5-2.5 (3) MG/3ML SOLN Take 3 mLs by nebulization every 4 (four) hours as needed (shortness of breath or wheezing).   metFORMIN (GLUCOPHAGE) 500 MG tablet TAKE 1 TABLET BY MOUTH TWICE A DAY WITH FOOD   methocarbamol (ROBAXIN) 750 MG tablet Take 1 tablet (750 mg total) by mouth every 8 (eight) hours as needed for muscle spasms.   metoprolol succinate (TOPROL-XL) 100 MG 24 hr tablet TAKE 1 TABLET BY MOUTH DAILY. TAKE WITH OR IMMEDIATELY FOLLOWING A MEAL.   Microlet Lancets MISC 1 each by Does not apply route daily. DX-E11.65   minoxidil (LONITEN) 10 MG tablet TAKE 1 TABLET BY MOUTH EVERY DAY   Multiple Vitamins-Calcium (ONE-A-DAY WOMENS PO) Take 1 tablet by mouth daily.   ondansetron (ZOFRAN-ODT) 8 MG disintegrating tablet TAKE 1 TABLET EVERY 8 HOURS AS NEEDED FOR NAUSEA OR VOMITING. LET THE TABLET DISSOLVE IN MOUTH   OVER THE COUNTER MEDICATION 1 tablet daily as needed. Takes generic stool softener for constipation d/t oxycodone.    oxyCODONE (OXY IR/ROXICODONE) 5 MG immediate release tablet Take by mouth.   oxyCODONE-acetaminophen (PERCOCET/ROXICET) 5-325 MG tablet Take 1 tablet by mouth every 8 (eight) hours as needed for severe pain.   [START ON 12/29/2022] oxyCODONE-acetaminophen (PERCOCET/ROXICET) 5-325 MG tablet Take 1 tablet by mouth every 8 (eight) hours as needed for severe pain.   pregabalin (LYRICA) 100 MG capsule 100 mg qAM, 200 mg qhs   rosuvastatin (CRESTOR) 5 MG tablet TAKE 1 TABLET (  5 MG TOTAL) BY MOUTH DAILY.   Semaglutide, 1 MG/DOSE, (OZEMPIC, 1 MG/DOSE,) 4 MG/3ML SOPN Inject 1 mg into the skin once a week.   Vitamin D, Ergocalciferol, (DRISDOL) 1.25 MG (50000 UNIT) CAPS capsule TAKE 1 CAPSULE BY MOUTH ONE TIME PER WEEK   [DISCONTINUED] Dulaglutide (TRULICITY) 3 MG/0.5ML SOPN Inject 3 mg as directed once a week.   [DISCONTINUED] Dulaglutide (TRULICITY) 4.5 MG/0.5ML SOPN INJECT 4.5 MG INTO THE SKIN  ONCE A WEEK.   oxyCODONE-acetaminophen (PERCOCET/ROXICET) 5-325 MG tablet Take 1 tablet by mouth every 8 (eight) hours as needed for severe pain.   [EXPIRED] cyanocobalamin (VITAMIN B12) injection 1,000 mcg    No facility-administered encounter medications on file as of 12/04/2022.    Surgical History: Past Surgical History:  Procedure Laterality Date   CESAREAN SECTION  1992   CHOLECYSTECTOMY  10/26/2020   COLONOSCOPY     polyps removed first procedure. 2nd time all was clear   COLONOSCOPY WITH PROPOFOL N/A 03/14/2019   Procedure: COLONOSCOPY WITH PROPOFOL;  Surgeon: Toney Reil, MD;  Location: [facility];  Service: Gastroenterology;  Laterality: N/A;   FINGER SURGERY Left 2011   left finger cut off x 2.(only up to last digit)   HEMATOMA EVACUATION Left 1999   upper part of foot was injured d/t 500lb weight landing on her foot.    LAPAROSCOPIC SALPINGO OOPHERECTOMY Left 04/02/2018   Procedure: LAPAROSCOPIC SALPINGO OOPHORECTOMY;  Surgeon: Vena Austria, MD;  Location: [facility] ORS;  Service: Gynecology;  Laterality: Left;    Medical History: Past Medical History:  Diagnosis Date   Anginal pain (HCC)    tightness related to anxiety   Anxiety    Cholecystolithiasis    Depression    Diabetes mellitus without complication (HCC)    GERD (gastroesophageal reflux disease)    throws up easily but not diagnosed with reflux   History of C-section 1992   Hypertension    Sleep apnea    uses cpap    Family History: Family History  Problem Relation Age of Onset   Breast cancer Mother 40       x 3 times   Colon cancer Mother    Brain cancer Mother    Cancer - Colon Mother    Cancer - Other Mother    Heart Problems Father    Colon cancer Brother    Heart Problems Brother     Social History   Socioeconomic History   Marital status: Widowed    Spouse name: Not on file   Number of children: 1   Years of education: Not on file   Highest education level: Not on file   Occupational History   Occupation: works in Programme researcher, broadcasting/film/video  Tobacco Use   Smoking status: Never   Smokeless tobacco: Never  Vaping Use   Vaping Use: Never used  Substance and Sexual Activity   Alcohol use: No   Drug use: No   Sexual activity: Not Currently    Birth control/protection: Post-menopausal  Other Topics Concern   Not on file  Social History Narrative   Son has schizophrenia and autism but is fully capable of helping mother after surgery   Social Determinants of Health   Financial Resource Strain: Not on file  Food Insecurity: Not on file  Transportation Needs: Not on file  Physical Activity: Not on file  Stress: Not on file  Social Connections: Not on file  Intimate Partner Violence: Not on file      Review of  Systems  Constitutional:  Negative for activity change, appetite change, chills, fatigue, fever and unexpected weight change.  HENT: Negative.  Negative for congestion, ear pain, rhinorrhea, sore throat and trouble swallowing.   Eyes: Negative.   Respiratory:  Positive for shortness of breath (intermittent). Negative for cough, chest tightness and wheezing.   Cardiovascular: Negative.  Negative for chest pain and palpitations.  Gastrointestinal: Negative.  Negative for abdominal pain, blood in stool, constipation, diarrhea, nausea and vomiting.  Endocrine: Negative.   Genitourinary: Negative.  Negative for difficulty urinating, dysuria, frequency, hematuria and urgency.  Musculoskeletal: Negative.  Negative for arthralgias, back pain, joint swelling, myalgias and neck pain.  Skin: Negative.  Negative for rash and wound.  Allergic/Immunologic: Negative.  Negative for immunocompromised state.  Neurological: Negative.  Negative for dizziness, seizures, numbness and headaches.  Hematological: Negative.   Psychiatric/Behavioral:  Negative for behavioral problems, self-injury and suicidal ideas. The patient is not nervous/anxious.     Vital Signs: BP  129/76   Pulse 60   Temp 98.3 F (36.8 C)   Resp 16   Ht 5\' 9"  (1.753 m)   Wt (!) 319 lb 6.4 oz (144.9 kg)   SpO2 96%   BMI 47.17 kg/m    Physical Exam Vitals reviewed.  Constitutional:      General: She is awake. She is not in acute distress.    Appearance: Normal appearance. She is well-developed and well-groomed. She is morbidly obese. She is not ill-appearing or diaphoretic.  HENT:     Head: Normocephalic and atraumatic.     Right Ear: Tympanic membrane, ear canal and external ear normal.     Left Ear: Tympanic membrane, ear canal and external ear normal.     Nose: Nose normal. No congestion or rhinorrhea.     Mouth/Throat:     Lips: Pink.     Mouth: Mucous membranes are moist.     Pharynx: Oropharynx is clear. Uvula midline. No oropharyngeal exudate or posterior oropharyngeal erythema.  Eyes:     General: Lids are normal. Vision grossly intact. Gaze aligned appropriately. No scleral icterus.       Right eye: No discharge.        Left eye: No discharge.     Extraocular Movements: Extraocular movements intact.     Conjunctiva/sclera: Conjunctivae normal.     Pupils: Pupils are equal, round, and reactive to light.     Funduscopic exam:    Right eye: Red reflex present.        Left eye: Red reflex present. Neck:     Thyroid: No thyromegaly.     Vascular: No JVD.     Trachea: Trachea and phonation normal. No tracheal deviation.  Cardiovascular:     Rate and Rhythm: Normal rate and regular rhythm.     Pulses:          Carotid pulses are 3+ on the right side and 3+ on the left side.      Radial pulses are 2+ on the right side and 2+ on the left side.       Dorsalis pedis pulses are 2+ on the right side and 2+ on the left side.       Posterior tibial pulses are 1+ on the right side and 1+ on the left side.     Heart sounds: Normal heart sounds, S1 normal and S2 normal. No murmur heard.    No friction rub. No gallop.  Pulmonary:     Effort: Pulmonary effort  is normal. No  accessory muscle usage or respiratory distress.     Breath sounds: Normal breath sounds and air entry. No stridor. No wheezing or rales.  Chest:     Chest wall: No tenderness.     Comments: Declined clinical breast exam, patient had a normal mammogram in march this year.  Abdominal:     General: Bowel sounds are normal. There is no distension.     Palpations: Abdomen is soft. There is no shifting dullness, fluid wave, mass or pulsatile mass.     Tenderness: There is no abdominal tenderness. There is no guarding or rebound.  Musculoskeletal:        General: No tenderness or deformity.     Cervical back: Normal range of motion and neck supple.     Right lower leg: 1+ Pitting Edema present.     Left lower leg: 1+ Pitting Edema present.     Right foot: Decreased range of motion. No deformity, bunion, Charcot foot, foot drop or prominent metatarsal heads.     Left foot: Decreased range of motion. No deformity, bunion, Charcot foot, foot drop or prominent metatarsal heads.  Feet:     Right foot:     Protective Sensation: 6 sites tested.  6 sites sensed.     Skin integrity: Callus and dry skin present. No ulcer, blister, skin breakdown, erythema, warmth or fissure.     Toenail Condition: Right toenails are abnormally thick and long. Fungal disease present.    Left foot:     Protective Sensation: 6 sites tested.  6 sites sensed.     Skin integrity: Callus and dry skin present. No ulcer, blister, skin breakdown, warmth or fissure.     Toenail Condition: Left toenails are abnormally thick and long. Fungal disease present. Lymphadenopathy:     Cervical: No cervical adenopathy.  Skin:    General: Skin is warm and dry.     Capillary Refill: Capillary refill takes less than 2 seconds.     Coloration: Skin is not pale.     Findings: No erythema or rash.  Neurological:     Mental Status: She is alert and oriented to person, place, and time.     Cranial Nerves: No cranial nerve deficit.     Motor:  No abnormal muscle tone.     Coordination: Coordination normal.     Gait: Gait normal.     Deep Tendon Reflexes: Reflexes are normal and symmetric.  Psychiatric:        Mood and Affect: Mood and affect normal.        Behavior: Behavior normal. Behavior is cooperative.        Thought Content: Thought content normal.        Judgment: Judgment normal.        Assessment/Plan: 1. Encounter for routine adult health examination with abnormal findings Age-appropriate preventive screenings and vaccinations discussed, annual physical exam completed. Routine labs for health maintenance ordered, see below. PHM updated.  - CBC with Differential/Platelet - CMP14+EGFR - Lipid Profile - Hgb A1C w/o eAG  2. Type 2 diabetes mellitus with hyperglycemia, without long-term current use of insulin (HCC) Trulicity discontinued, start ozempic instead. Routine labs ordered - Urine Microalbumin w/creat. ratio - Semaglutide, 1 MG/DOSE, (OZEMPIC, 1 MG/DOSE,) 4 MG/3ML SOPN; Inject 1 mg into the skin once a week.  Dispense: 3 mL; Refill: 5 - Hgb A1C w/o eAG  3. Mixed hyperlipidemia Routine labs ordered - CBC with Differential/Platelet - CMP14+EGFR - Lipid Profile  4. B12 deficiency B12 injection administered in office today - cyanocobalamin (VITAMIN B12) injection 1,000 mcg  5. Dysuria Routine urinalysis done  - UA/M w/rflx Culture, Routine  6. Encounter for screening mammogram for malignant neoplasm of breast Routine mammogram ordered - MM 3D SCREENING MAMMOGRAM BILATERAL BREAST; Future      General Counseling: Aerianna verbalizes understanding of the findings of todays visit and agrees with plan of treatment. I have discussed any further diagnostic evaluation that may be needed or ordered today. We also reviewed her medications today. she has been encouraged to call the office with any questions or concerns that should arise related to todays visit.    Orders Placed This Encounter  Procedures    MM 3D SCREENING MAMMOGRAM BILATERAL BREAST   UA/M w/rflx Culture, Routine   Urine Microalbumin w/creat. ratio   CBC with Differential/Platelet   CMP14+EGFR   Lipid Profile   Hgb A1C w/o eAG    Meds ordered this encounter  Medications   cyanocobalamin (VITAMIN B12) injection 1,000 mcg   Semaglutide, 1 MG/DOSE, (OZEMPIC, 1 MG/DOSE,) 4 MG/3ML SOPN    Sig: Inject 1 mg into the skin once a week.    Dispense:  3 mL    Refill:  5    Switch to ozempic since trulicity is on back order. Dx code E11.65    Return in about 4 months (around 04/05/2023) for F/U, Recheck A1C, Montey Ebel PCP.   Total time spent:30 Minutes Time spent includes review of chart, medications, test results, and follow up plan with the patient.   [place] Controlled Substance Database was reviewed by me.  This patient was seen by Sallyanne Kuster, FNP-C in collaboration with Dr. Beverely Risen as a part of collaborative care agreement.  Sharne Linders R. Tedd Sias, MSN, FNP-C Internal medicine

## 2022-12-05 ENCOUNTER — Ambulatory Visit: Payer: No Typology Code available for payment source

## 2022-12-05 LAB — CMP14+EGFR
ALT: 18 IU/L (ref 0–32)
AST: 12 IU/L (ref 0–40)
Albumin/Globulin Ratio: 2
Albumin: 4.3 g/dL (ref 3.9–4.9)
Alkaline Phosphatase: 99 IU/L (ref 44–121)
BUN/Creatinine Ratio: 17 (ref 12–28)
BUN: 14 mg/dL (ref 8–27)
Bilirubin Total: 0.3 mg/dL (ref 0.0–1.2)
CO2: 24 mmol/L (ref 20–29)
Calcium: 9.4 mg/dL (ref 8.7–10.3)
Chloride: 102 mmol/L (ref 96–106)
Creatinine, Ser: 0.83 mg/dL (ref 0.57–1.00)
Globulin, Total: 2.1 g/dL (ref 1.5–4.5)
Glucose: 202 mg/dL — ABNORMAL HIGH (ref 70–99)
Potassium: 4.8 mmol/L (ref 3.5–5.2)
Sodium: 139 mmol/L (ref 134–144)
Total Protein: 6.4 g/dL (ref 6.0–8.5)
eGFR: 80 mL/min/{1.73_m2} (ref >59)

## 2022-12-05 LAB — UA/M W/RFLX CULTURE, ROUTINE
Bilirubin, UA: NEGATIVE
Glucose, UA: NEGATIVE
Ketones, UA: NEGATIVE
Leukocytes,UA: NEGATIVE
Nitrite, UA: NEGATIVE
Protein,UA: NEGATIVE
RBC, UA: NEGATIVE
Specific Gravity, UA: 1.018 (ref 1.005–1.030)
Urobilinogen, Ur: 0.2 mg/dL (ref 0.2–1.0)
pH, UA: 5.5 (ref 5.0–7.5)

## 2022-12-05 LAB — CBC WITH DIFFERENTIAL/PLATELET
Basophils Absolute: 0.1 10*3/uL (ref 0.0–0.2)
Basos: 1 %
EOS (ABSOLUTE): 0 10*3/uL (ref 0.0–0.4)
Eos: 0 %
Hematocrit: 38.4 % (ref 34.0–46.6)
Hemoglobin: 12.7 g/dL (ref 11.1–15.9)
Immature Grans (Abs): 0.1 10*3/uL (ref 0.0–0.1)
Immature Granulocytes: 1 %
Lymphocytes Absolute: 2 10*3/uL (ref 0.7–3.1)
Lymphs: 27 %
MCH: 27.7 pg (ref 26.6–33.0)
MCHC: 33.1 g/dL (ref 31.5–35.7)
MCV: 84 fL (ref 79–97)
Monocytes Absolute: 0.7 10*3/uL (ref 0.1–0.9)
Monocytes: 10 %
Neutrophils Absolute: 4.5 10*3/uL (ref 1.4–7.0)
Neutrophils: 61 %
Platelets: 253 10*3/uL (ref 150–450)
RBC: 4.59 x10E6/uL (ref 3.77–5.28)
RDW: 13.3 % (ref 11.7–15.4)
WBC: 7.3 10*3/uL (ref 3.4–10.8)

## 2022-12-05 LAB — LIPID PANEL
Chol/HDL Ratio: 3.5 ratio (ref 0.0–4.4)
Cholesterol, Total: 183 mg/dL (ref 100–199)
HDL: 52 mg/dL (ref >39)
LDL Chol Calc (NIH): 97 mg/dL (ref 0–99)
Triglycerides: 200 mg/dL — ABNORMAL HIGH (ref 0–149)
VLDL Cholesterol Cal: 34 mg/dL (ref 5–40)

## 2022-12-05 LAB — MICROSCOPIC EXAMINATION
Bacteria, UA: NONE SEEN
Casts: NONE SEEN /lpf
RBC, Urine: NONE SEEN /hpf (ref 0–2)
WBC, UA: NONE SEEN /hpf (ref 0–5)

## 2022-12-05 LAB — HGB A1C W/O EAG: Hgb A1c MFr Bld: 8.8 % — ABNORMAL HIGH (ref 4.8–5.6)

## 2022-12-09 ENCOUNTER — Other Ambulatory Visit: Payer: Self-pay | Admitting: Nurse Practitioner

## 2022-12-09 DIAGNOSIS — E559 Vitamin D deficiency, unspecified: Secondary | ICD-10-CM

## 2022-12-11 ENCOUNTER — Other Ambulatory Visit: Payer: Self-pay | Admitting: Nurse Practitioner

## 2022-12-11 DIAGNOSIS — Z760 Encounter for issue of repeat prescription: Secondary | ICD-10-CM

## 2022-12-12 ENCOUNTER — Other Ambulatory Visit: Payer: Self-pay | Admitting: Nurse Practitioner

## 2022-12-12 DIAGNOSIS — I10 Essential (primary) hypertension: Secondary | ICD-10-CM

## 2022-12-20 ENCOUNTER — Telehealth: Payer: Self-pay | Admitting: Nurse Practitioner

## 2022-12-20 ENCOUNTER — Telehealth: Payer: No Typology Code available for payment source | Admitting: Nurse Practitioner

## 2022-12-20 ENCOUNTER — Encounter: Payer: Self-pay | Admitting: Nurse Practitioner

## 2022-12-20 VITALS — Resp 16 | Ht 69.0 in | Wt 320.0 lb

## 2022-12-20 DIAGNOSIS — J0110 Acute frontal sinusitis, unspecified: Secondary | ICD-10-CM

## 2022-12-20 DIAGNOSIS — M25551 Pain in right hip: Secondary | ICD-10-CM | POA: Diagnosis not present

## 2022-12-20 DIAGNOSIS — S79911A Unspecified injury of right hip, initial encounter: Secondary | ICD-10-CM

## 2022-12-20 DIAGNOSIS — Z9181 History of falling: Secondary | ICD-10-CM

## 2022-12-20 MED ORDER — AZITHROMYCIN 250 MG PO TABS
ORAL_TABLET | ORAL | 0 refills | Status: AC
Start: 2022-12-20 — End: 2022-12-25

## 2022-12-20 MED ORDER — PREDNISONE 10 MG (21) PO TBPK: ORAL_TABLET | ORAL | 0 refills | Status: DC

## 2022-12-20 NOTE — Progress Notes (Signed)
[facility] [address] [place], [place] [address]  Internal MEDICINE  Telephone Visit  Patient Name: Candace Clayton  [id number]  [id number]  Date of Service: 12/20/2022  I connected with the patient at 1200 by telephone and verified the patients identity using two identifiers.   I discussed the limitations, risks, security and privacy concerns of performing an evaluation and management service by telephone and the availability of in person appointments. I also discussed with the patient that there may be a patient responsible charge related to the service.  The patient expressed understanding and agrees to proceed.    Chief Complaint  Patient presents with   Telephone Screen    Larey Seat yesterday on concrete getting out of her bed, didn't do a covid test     HPI Candace Clayton presents for a telehealth virtual visit for symptoms of sinusitis and a recent fall. Reports fever, chills, fatigue, body aches, headache, sore throat, nasal congestion, cough. Symptom onset was 2-3 days ago. She did not do a covid test. She is asking for a zpak.  Larey Seat out of bed onto concrete floor yesterday. Has acute pain of her right hip, with difficulty walking and difficulty bearing weight on her right leg. She states that she can't even make it to the bathroom in her home.     Current Medication: Outpatient Encounter Medications as of 12/20/2022  Medication Sig   albuterol (VENTOLIN HFA) 108 (90 Base) MCG/ACT inhaler TAKE 2 PUFFS BY MOUTH EVERY 6 HOURS AS NEEDED FOR WHEEZE OR SHORTNESS OF BREATH   amLODipine (NORVASC) 10 MG tablet TAKE 1 TABLET BY MOUTH EVERY DAY   azithromycin (ZITHROMAX) 250 MG tablet Take 2 tablets on day 1, then 1 tablet daily on days 2 through 5   Budeson-Glycopyrrol-Formoterol (BREZTRI AEROSPHERE) 160-9-4.8 MCG/ACT AERO Inhale 2 puffs into the lungs 2 (two) times daily.   busPIRone (BUSPAR) 15 MG tablet TAKE 1 TABLET (15 MG TOTAL) BY MOUTH 3 (THREE) TIMES DAILY FOR ANXIETY.    chlorthalidone (HYGROTON) 25 MG tablet TAKE 1 TABLET (25 MG TOTAL) BY MOUTH DAILY.   clobetasol ointment (TEMOVATE) 0.05 % Apply 1 Application topically daily. To affected area until resolved.   desvenlafaxine (PRISTIQ) 100 MG 24 hr tablet TAKE 1 TABLET BY MOUTH EVERY DAY   fluticasone (FLONASE) 50 MCG/ACT nasal spray Place 2 sprays into both nostrils daily.   glucose blood (CONTOUR NEXT TEST) test strip 1 each by Other route daily. DX E11.65   hydrochlorothiazide (HYDRODIURIL) 25 MG tablet TAKE 1 TABLET (25 MG TOTAL) BY MOUTH DAILY.   ipratropium-albuterol (DUONEB) 0.5-2.5 (3) MG/3ML SOLN Take 3 mLs by nebulization every 4 (four) hours as needed (shortness of breath or wheezing).   metFORMIN (GLUCOPHAGE) 500 MG tablet TAKE 1 TABLET BY MOUTH TWICE A DAY WITH FOOD   methocarbamol (ROBAXIN) 750 MG tablet Take 1 tablet (750 mg total) by mouth every 8 (eight) hours as needed for muscle spasms.   metoprolol succinate (TOPROL-XL) 100 MG 24 hr tablet TAKE 1 TABLET BY MOUTH EVERY DAY WITH OR IMMEDIATELY FOLLOWING A MEAL   Microlet Lancets MISC 1 each by Does not apply route daily. DX-E11.65   minoxidil (LONITEN) 10 MG tablet TAKE 1 TABLET BY MOUTH EVERY DAY   Multiple Vitamins-Calcium (ONE-A-DAY WOMENS PO) Take 1 tablet by mouth daily.   ondansetron (ZOFRAN-ODT) 8 MG disintegrating tablet TAKE 1 TABLET EVERY 8 HOURS AS NEEDED FOR NAUSEA OR VOMITING. LET THE TABLET DISSOLVE IN MOUTH   OVER THE COUNTER MEDICATION  1 tablet daily as needed. Takes generic stool softener for constipation d/t oxycodone.    oxyCODONE (OXY IR/ROXICODONE) 5 MG immediate release tablet Take by mouth.   oxyCODONE-acetaminophen (PERCOCET/ROXICET) 5-325 MG tablet Take 1 tablet by mouth every 8 (eight) hours as needed for severe pain.   [START ON 12/29/2022] oxyCODONE-acetaminophen (PERCOCET/ROXICET) 5-325 MG tablet Take 1 tablet by mouth every 8 (eight) hours as needed for severe pain.   predniSONE (STERAPRED UNI-PAK 21 TAB) 10 MG (21)  TBPK tablet Use as directed for 6 days   pregabalin (LYRICA) 100 MG capsule 100 mg qAM, 200 mg qhs   rosuvastatin (CRESTOR) 5 MG tablet TAKE 1 TABLET (5 MG TOTAL) BY MOUTH DAILY.   Semaglutide, 1 MG/DOSE, (OZEMPIC, 1 MG/DOSE,) 4 MG/3ML SOPN Inject 1 mg into the skin once a week.   Vitamin D, Ergocalciferol, (DRISDOL) 1.25 MG (50000 UNIT) CAPS capsule TAKE 1 CAPSULE BY MOUTH ONE TIME PER WEEK   oxyCODONE-acetaminophen (PERCOCET/ROXICET) 5-325 MG tablet Take 1 tablet by mouth every 8 (eight) hours as needed for severe pain.   No facility-administered encounter medications on file as of 12/20/2022.    Surgical History: Past Surgical History:  Procedure Laterality Date   CESAREAN SECTION  1992   CHOLECYSTECTOMY  10/26/2020   COLONOSCOPY     polyps removed first procedure. 2nd time all was clear   COLONOSCOPY WITH PROPOFOL N/A 03/14/2019   Procedure: COLONOSCOPY WITH PROPOFOL;  Surgeon: Toney Reil, MD;  Location: [facility] ENDOSCOPY;  Service: Gastroenterology;  Laterality: N/A;   FINGER SURGERY Left 2011   left finger cut off x 2.(only up to last digit)   HEMATOMA EVACUATION Left 1999   upper part of foot was injured d/t 500lb weight landing on her foot.    LAPAROSCOPIC SALPINGO OOPHERECTOMY Left 04/02/2018   Procedure: LAPAROSCOPIC SALPINGO OOPHORECTOMY;  Surgeon: Vena Austria, MD;  Location: [facility] ORS;  Service: Gynecology;  Laterality: Left;    Medical History: Past Medical History:  Diagnosis Date   Anginal pain (HCC)    tightness related to anxiety   Anxiety    Cholecystolithiasis    Depression    Diabetes mellitus without complication (HCC)    GERD (gastroesophageal reflux disease)    throws up easily but not diagnosed with reflux   History of C-section 1992   Hypertension    Sleep apnea    uses cpap    Family History: Family History  Problem Relation Age of Onset   Breast cancer Mother 40       x 3 times   Colon cancer Mother    Brain cancer Mother    Cancer  - Colon Mother    Cancer - Other Mother    Heart Problems Father    Colon cancer Brother    Heart Problems Brother     Social History   Socioeconomic History   Marital status: Widowed    Spouse name: Not on file   Number of children: 1   Years of education: Not on file   Highest education level: Not on file  Occupational History   Occupation: works in Programme researcher, broadcasting/film/video  Tobacco Use   Smoking status: Never   Smokeless tobacco: Never  Vaping Use   Vaping Use: Never used  Substance and Sexual Activity   Alcohol use: No   Drug use: No   Sexual activity: Not Currently    Birth control/protection: Post-menopausal  Other Topics Concern   Not on file  Social History Narrative   Son  has schizophrenia and autism but is fully capable of helping mother after surgery   Social Determinants of Health   Financial Resource Strain: Not on file  Food Insecurity: Not on file  Transportation Needs: Not on file  Physical Activity: Not on file  Stress: Not on file  Social Connections: Not on file  Intimate Partner Violence: Not on file      Review of Systems  Constitutional:  Positive for chills, fatigue and fever.  HENT:  Positive for congestion, postnasal drip, rhinorrhea and sore throat. Negative for sinus pressure and sinus pain.   Respiratory:  Positive for cough.   Cardiovascular: Negative.  Negative for chest pain and palpitations.  Musculoskeletal:  Positive for arthralgias (acute right hip pain), back pain, gait problem and myalgias.  Neurological:  Positive for weakness (right leg right hip) and headaches.    Vital Signs: Resp 16   Ht 5\' 9"  (1.753 m)   Wt (!) 320 lb (145.2 kg)   BMI 47.26 kg/m    Observation/Objective: She is alert and oriented and engages in conversation appropriately. No acute distress noted.     Assessment/Plan: 1. Acute right hip pain Xray of right hip ordered, consider orthopedic referral pending results.  - DG Hip Unilat W OR W/O Pelvis  2-3 Views Right; Future  2. Acute non-recurrent frontal sinusitis Zpak and prednisone taper prescribed.  - azithromycin (ZITHROMAX) 250 MG tablet; Take 2 tablets on day 1, then 1 tablet daily on days 2 through 5  Dispense: 6 tablet; Refill: 0 - predniSONE (STERAPRED UNI-PAK 21 TAB) 10 MG (21) TBPK tablet; Use as directed for 6 days  Dispense: 21 tablet; Refill: 0  3. History of recent fall Xray of right hip ordered  - DG Hip Unilat W OR W/O Pelvis 2-3 Views Right; Future  4. Injury of right hip, initial encounter Xray of right hip ordered.  - DG Hip Unilat W OR W/O Pelvis 2-3 Views Right; Future   General Counseling: Keyondra verbalizes understanding of the findings of today's phone visit and agrees with plan of treatment. I have discussed any further diagnostic evaluation that may be needed or ordered today. We also reviewed her medications today. she has been encouraged to call the office with any questions or concerns that should arise related to todays visit.  Return if symptoms worsen or fail to improve.   Orders Placed This Encounter  Procedures   DG Hip Unilat W OR W/O Pelvis 2-3 Views Right    Meds ordered this encounter  Medications   azithromycin (ZITHROMAX) 250 MG tablet    Sig: Take 2 tablets on day 1, then 1 tablet daily on days 2 through 5    Dispense:  6 tablet    Refill:  0   predniSONE (STERAPRED UNI-PAK 21 TAB) 10 MG (21) TBPK tablet    Sig: Use as directed for 6 days    Dispense:  21 tablet    Refill:  0    Time spent:10 Minutes Time spent with patient included reviewing progress notes, labs, imaging studies, and discussing plan for follow up.  [place] Controlled Substance Database was reviewed by me for overdose risk score (ORS) if appropriate.  This patient was seen by Sallyanne Kuster, FNP-C in collaboration with Dr. Beverely Risen as a part of collaborative care agreement.  Dessiree Sze R. Tedd Sias, MSN, FNP-C Internal medicine

## 2022-12-20 NOTE — Telephone Encounter (Signed)
Work note emailed to patient-Candace Clayton 

## 2022-12-25 NOTE — Progress Notes (Signed)
Will discuss results at upcoming office visit

## 2023-01-02 ENCOUNTER — Ambulatory Visit: Payer: No Typology Code available for payment source

## 2023-01-03 ENCOUNTER — Ambulatory Visit (INDEPENDENT_AMBULATORY_CARE_PROVIDER_SITE_OTHER): Payer: No Typology Code available for payment source

## 2023-01-03 DIAGNOSIS — E538 Deficiency of other specified B group vitamins: Secondary | ICD-10-CM | POA: Diagnosis not present

## 2023-01-03 MED ORDER — CYANOCOBALAMIN 1000 MCG/ML IJ SOLN
1000.0000 ug | Freq: Once | INTRAMUSCULAR | Status: AC
Start: 2023-01-03 — End: 2023-01-03
  Administered 2023-01-03: 1000 ug via INTRAMUSCULAR

## 2023-01-11 ENCOUNTER — Ambulatory Visit: Payer: No Typology Code available for payment source | Admitting: Nurse Practitioner

## 2023-01-17 DIAGNOSIS — G8929 Other chronic pain: Secondary | ICD-10-CM | POA: Insufficient documentation

## 2023-01-17 DIAGNOSIS — H8120 Vestibular neuronitis, unspecified ear: Secondary | ICD-10-CM | POA: Insufficient documentation

## 2023-01-18 ENCOUNTER — Encounter
Payer: No Typology Code available for payment source | Admitting: Student in an Organized Health Care Education/Training Program

## 2023-01-23 ENCOUNTER — Ambulatory Visit (INDEPENDENT_AMBULATORY_CARE_PROVIDER_SITE_OTHER): Payer: No Typology Code available for payment source | Admitting: Nurse Practitioner

## 2023-01-23 ENCOUNTER — Encounter: Payer: Self-pay | Admitting: Nurse Practitioner

## 2023-01-23 VITALS — BP 138/78 | HR 90 | Temp 97.7°F | Resp 16 | Ht 69.0 in | Wt 300.4 lb

## 2023-01-23 DIAGNOSIS — R42 Dizziness and giddiness: Secondary | ICD-10-CM

## 2023-01-23 MED ORDER — MECLIZINE HCL 12.5 MG PO TABS: 12.5000 mg | ORAL_TABLET | Freq: Three times a day (TID) | ORAL | 3 refills | Status: DC | PRN

## 2023-01-23 NOTE — Progress Notes (Signed)
[facility] [address] [place], [place] [address]  Internal MEDICINE  Office Visit Note  Patient Name: Candace Clayton  [id number]  [id number]  Date of Service: 01/23/2023  Chief Complaint  Patient presents with   Hospitalization Follow-up    HPI Jatyra presents for a follow-up visit for recent ED visit for vertigo.  Seen in ER for vertigo -- was treated with medrol dosepack but was not given any medication such as meclizine to alleviate symptoms of dizziness and nausea.  Requesting a sick note to keep her out of work until next Monday.     Current Medication: Outpatient Encounter Medications as of 01/23/2023  Medication Sig   albuterol (VENTOLIN HFA) 108 (90 Base) MCG/ACT inhaler TAKE 2 PUFFS BY MOUTH EVERY 6 HOURS AS NEEDED FOR WHEEZE OR SHORTNESS OF BREATH   amLODipine (NORVASC) 10 MG tablet TAKE 1 TABLET BY MOUTH EVERY DAY   Budeson-Glycopyrrol-Formoterol (BREZTRI AEROSPHERE) 160-9-4.8 MCG/ACT AERO Inhale 2 puffs into the lungs 2 (two) times daily.   busPIRone (BUSPAR) 15 MG tablet TAKE 1 TABLET (15 MG TOTAL) BY MOUTH 3 (THREE) TIMES DAILY FOR ANXIETY.   chlorthalidone (HYGROTON) 25 MG tablet TAKE 1 TABLET (25 MG TOTAL) BY MOUTH DAILY.   clobetasol ointment (TEMOVATE) 0.05 % Apply 1 Application topically daily. To affected area until resolved.   desvenlafaxine (PRISTIQ) 100 MG 24 hr tablet TAKE 1 TABLET BY MOUTH EVERY DAY   fluticasone (FLONASE) 50 MCG/ACT nasal spray Place 2 sprays into both nostrils daily.   glucose blood (CONTOUR NEXT TEST) test strip 1 each by Other route daily. DX E11.65   hydrochlorothiazide (HYDRODIURIL) 25 MG tablet TAKE 1 TABLET (25 MG TOTAL) BY MOUTH DAILY.   ipratropium-albuterol (DUONEB) 0.5-2.5 (3) MG/3ML SOLN Take 3 mLs by nebulization every 4 (four) hours as needed (shortness of breath or wheezing).   meclizine (ANTIVERT) 12.5 MG tablet Take 1-2 tablets (12.5-25 mg total) by mouth 3 (three) times daily as needed for dizziness.    metFORMIN (GLUCOPHAGE) 500 MG tablet TAKE 1 TABLET BY MOUTH TWICE A DAY WITH FOOD   methocarbamol (ROBAXIN) 750 MG tablet Take 1 tablet (750 mg total) by mouth every 8 (eight) hours as needed for muscle spasms.   metoprolol succinate (TOPROL-XL) 100 MG 24 hr tablet TAKE 1 TABLET BY MOUTH EVERY DAY WITH OR IMMEDIATELY FOLLOWING A MEAL   Microlet Lancets MISC 1 each by Does not apply route daily. DX-E11.65   minoxidil (LONITEN) 10 MG tablet TAKE 1 TABLET BY MOUTH EVERY DAY   Multiple Vitamins-Calcium (ONE-A-DAY WOMENS PO) Take 1 tablet by mouth daily.   ondansetron (ZOFRAN-ODT) 8 MG disintegrating tablet TAKE 1 TABLET EVERY 8 HOURS AS NEEDED FOR NAUSEA OR VOMITING. LET THE TABLET DISSOLVE IN MOUTH   OVER THE COUNTER MEDICATION 1 tablet daily as needed. Takes generic stool softener for constipation d/t oxycodone.    oxyCODONE (OXY IR/ROXICODONE) 5 MG immediate release tablet Take by mouth.   oxyCODONE-acetaminophen (PERCOCET/ROXICET) 5-325 MG tablet Take 1 tablet by mouth every 8 (eight) hours as needed for severe pain.   predniSONE (STERAPRED UNI-PAK 21 TAB) 10 MG (21) TBPK tablet Use as directed for 6 days   pregabalin (LYRICA) 100 MG capsule 100 mg qAM, 200 mg qhs   rosuvastatin (CRESTOR) 5 MG tablet TAKE 1 TABLET (5 MG TOTAL) BY MOUTH DAILY.   Semaglutide, 1 MG/DOSE, (OZEMPIC, 1 MG/DOSE,) 4 MG/3ML SOPN Inject 1 mg into the skin once a week.   Vitamin D, Ergocalciferol, (DRISDOL) 1.25 MG (  50000 UNIT) CAPS capsule TAKE 1 CAPSULE BY MOUTH ONE TIME PER WEEK   oxyCODONE-acetaminophen (PERCOCET/ROXICET) 5-325 MG tablet Take 1 tablet by mouth every 8 (eight) hours as needed for severe pain.   oxyCODONE-acetaminophen (PERCOCET/ROXICET) 5-325 MG tablet Take 1 tablet by mouth every 8 (eight) hours as needed for severe pain.   No facility-administered encounter medications on file as of 01/23/2023.    Surgical History: Past Surgical History:  Procedure Laterality Date   CESAREAN SECTION  1992    CHOLECYSTECTOMY  10/26/2020   COLONOSCOPY     polyps removed first procedure. 2nd time all was clear   COLONOSCOPY WITH PROPOFOL N/A 03/14/2019   Procedure: COLONOSCOPY WITH PROPOFOL;  Surgeon: Toney Reil, MD;  [facility] ENDOSCOPY;  Service: Gastroenterology;  Laterality: N/A;   FINGER SURGERY Left 2011   left finger cut off x 2.(only up to last digit)   HEMATOMA EVACUATION Left 1999   upper part of foot was injured d/t 500lb weight landing on her foot.    LAPAROSCOPIC SALPINGO OOPHERECTOMY Left 04/02/2018   Procedure: LAPAROSCOPIC SALPINGO OOPHORECTOMY;  Surgeon: Vena Austria, MD;  Location: [facility] ORS;  Service: Gynecology;  Laterality: Left;    Medical History: Past Medical History:  Diagnosis Date   Anginal pain (HCC)    tightness related to anxiety   Anxiety    Cholecystolithiasis    Depression    Diabetes mellitus without complication (HCC)    GERD (gastroesophageal reflux disease)    throws up easily but not diagnosed with reflux   History of C-section 1992   Hypertension    Sleep apnea    uses cpap    Family History: Family History  Problem Relation Age of Onset   Breast cancer Mother 40       x 3 times   Colon cancer Mother    Brain cancer Mother    Cancer - Colon Mother    Cancer - Other Mother    Heart Problems Father    Colon cancer Brother    Heart Problems Brother     Social History   Socioeconomic History   Marital status: Widowed    Spouse name: Not on file   Number of children: 1   Years of education: Not on file   Highest education level: Not on file  Occupational History   Occupation: works in Programme researcher, broadcasting/film/video  Tobacco Use   Smoking status: Never   Smokeless tobacco: Never  Vaping Use   Vaping status: Never Used  Substance and Sexual Activity   Alcohol use: No   Drug use: No   Sexual activity: Not Currently    Birth control/protection: Post-menopausal  Other Topics Concern   Not on file  Social History Narrative    Son has schizophrenia and autism but is fully capable of helping mother after surgery   Social Determinants of Health   Financial Resource Strain: Not on file  Food Insecurity: Not on file  Transportation Needs: Not on file  Physical Activity: Not on file  Stress: Not on file  Social Connections: Not on file  Intimate Partner Violence: Not on file      Review of Systems  Constitutional:  Negative for chills, fatigue and unexpected weight change.  HENT:  Negative for congestion, sneezing and sore throat.   Respiratory: Negative.  Negative for cough, chest tightness, shortness of breath and wheezing.   Cardiovascular: Negative.  Negative for chest pain and palpitations.  Gastrointestinal:  Negative for abdominal pain, constipation,  diarrhea, nausea and vomiting.  Musculoskeletal:  Positive for arthralgias, back pain and joint swelling (right knee). Negative for gait problem and neck pain.  Skin:  Negative for rash.  Neurological:  Positive for dizziness.  Psychiatric/Behavioral:  Negative for behavioral problems (Depression), sleep disturbance and suicidal ideas. The patient is not nervous/anxious.     Vital Signs: BP 138/78 Comment: 145/80  Pulse 90   Temp 97.7 F (36.5 C)   Resp 16   Ht 5\' 9"  (1.753 m)   Wt (!) 300 lb 6.4 oz (136.3 kg)   SpO2 96%   BMI 44.36 kg/m    Physical Exam Vitals reviewed.  Constitutional:      General: She is not in acute distress.    Appearance: Normal appearance. She is obese. She is not ill-appearing.  HENT:     Head: Normocephalic and atraumatic.  Eyes:     Pupils: Pupils are equal, round, and reactive to light.  Cardiovascular:     Rate and Rhythm: Normal rate and regular rhythm.  Pulmonary:     Effort: Pulmonary effort is normal. No respiratory distress.  Neurological:     Mental Status: She is alert and oriented to person, place, and time.  Psychiatric:        Mood and Affect: Mood normal.        Behavior: Behavior normal.         Assessment/Plan: 1. Vertigo Finish medrol dosepack as prescribed by ED. Take meclizine for symptoms of nausea and dizziness as needed. Return to work next week on Monday. If symptoms do not improve or worsen, call the clinic.  - meclizine (ANTIVERT) 12.5 MG tablet; Take 1-2 tablets (12.5-25 mg total) by mouth 3 (three) times daily as needed for dizziness.  Dispense: 90 tablet; Refill: 3   General Counseling: Rhealynn verbalizes understanding of the findings of todays visit and agrees with plan of treatment. I have discussed any further diagnostic evaluation that may be needed or ordered today. We also reviewed her medications today. she has been encouraged to call the office with any questions or concerns that should arise related to todays visit.    No orders of the defined types were placed in this encounter.   Meds ordered this encounter  Medications   meclizine (ANTIVERT) 12.5 MG tablet    Sig: Take 1-2 tablets (12.5-25 mg total) by mouth 3 (three) times daily as needed for dizziness.    Dispense:  90 tablet    Refill:  3    Return for previously scheduled, F/U,  PCP in october, need doc note out until monday next week.   Total time spent:30 Minutes Time spent includes review of chart, medications, test results, and follow up plan with the patient.   [place] Controlled Substance Database was reviewed by me.  This patient was seen by Sallyanne Kuster, FNP-C in collaboration with Dr. Beverely Risen as a part of collaborative care agreement.    R. Tedd Sias, MSN, FNP-C Internal medicine

## 2023-01-25 ENCOUNTER — Encounter: Payer: Self-pay | Admitting: Student in an Organized Health Care Education/Training Program

## 2023-01-25 ENCOUNTER — Ambulatory Visit
Payer: No Typology Code available for payment source | Attending: Student in an Organized Health Care Education/Training Program | Admitting: Student in an Organized Health Care Education/Training Program

## 2023-01-25 VITALS — BP 162/86 | HR 69 | Temp 97.3°F | Resp 16 | Ht 69.0 in | Wt 300.0 lb

## 2023-01-25 DIAGNOSIS — M5412 Radiculopathy, cervical region: Secondary | ICD-10-CM

## 2023-01-25 DIAGNOSIS — G894 Chronic pain syndrome: Secondary | ICD-10-CM

## 2023-01-25 DIAGNOSIS — M5136 Other intervertebral disc degeneration, lumbar region: Secondary | ICD-10-CM

## 2023-01-25 DIAGNOSIS — M47816 Spondylosis without myelopathy or radiculopathy, lumbar region: Secondary | ICD-10-CM | POA: Diagnosis present

## 2023-01-25 MED ORDER — PREGABALIN 100 MG PO CAPS
ORAL_CAPSULE | ORAL | 5 refills | Status: DC
Start: 2023-01-25 — End: 2023-05-01

## 2023-01-25 MED ORDER — OXYCODONE-ACETAMINOPHEN 5-325 MG PO TABS
1.0000 | ORAL_TABLET | Freq: Three times a day (TID) | ORAL | 0 refills | Status: DC | PRN
Start: 2023-01-28 — End: 2023-05-01

## 2023-01-25 MED ORDER — METHOCARBAMOL 750 MG PO TABS: 750.0000 mg | ORAL_TABLET | Freq: Three times a day (TID) | ORAL | 1 refills | Status: AC | PRN

## 2023-01-25 MED ORDER — OXYCODONE-ACETAMINOPHEN 5-325 MG PO TABS
1.0000 | ORAL_TABLET | Freq: Three times a day (TID) | ORAL | 0 refills | Status: DC | PRN
Start: 2023-03-29 — End: 2023-05-01

## 2023-01-25 MED ORDER — OXYCODONE-ACETAMINOPHEN 5-325 MG PO TABS
1.0000 | ORAL_TABLET | Freq: Three times a day (TID) | ORAL | 0 refills | Status: DC | PRN
Start: 2023-02-27 — End: 2023-05-01

## 2023-01-25 NOTE — Patient Instructions (Signed)

## 2023-01-25 NOTE — Progress Notes (Signed)
Nursing Pain Medication Assessment:  Safety precautions to be maintained throughout the outpatient stay will include: orient to surroundings, keep bed in low position, maintain call bell within reach at all times, provide assistance with transfer out of bed and ambulation.  Medication Inspection Compliance: Candace Clayton did not comply with our request to bring her pills to be counted. She was reminded that bringing the medication bottles, even when empty, is a requirement.  Medication: None brought in. Pill/Patch Count: None available to be counted. Bottle Appearance: No container available. Did not bring bottle(s) to appointment. Filled Date: N/A Last Medication intake:  Today  

## 2023-01-25 NOTE — Progress Notes (Signed)
PROVIDER NOTE: Information contained herein reflects review and annotations entered in association with encounter. Interpretation of such information and data should be left to medically-trained personnel. Information provided to patient can be located elsewhere in the medical record under "Patient Instructions". Document created using STT-dictation technology, any transcriptional errors that may result from process are unintentional.    Patient: Candace Clayton  Service Category: E/M  Provider: Edward Jolly, MD  DOB: 1960/12/05  DOS: 01/25/2023  Referring Provider: Sallyanne Kuster, NP  MRN: [id number]  Specialty: Interventional Pain Management  PCP: Sallyanne Kuster, NP  Type: Established Patient  Setting: Ambulatory outpatient    Location: Office  Delivery: Face-to-face     HPI  [name], a 62 y.o. year old female, is here today because of her Lumbar facet arthropathy [M47.816]. Candace Clayton primary complain today is Back Pain (Lumbar bilateral worse on the right ) Last encounter: My last encounter with her was on 10/24/22 Pertinent problems: Candace Clayton has Major depressive disorder, recurrent episode, moderate (HCC); Morbid obesity (HCC); Low back pain with sciatica; OSA on CPAP; Lumbar degenerative disc disease; Lumbar facet arthropathy (R>L); Lumbar spondylosis; Chronic pain syndrome; and Type 2 diabetes mellitus with hyperglycemia, without long-term current use of insulin (HCC) on their pertinent problem list. Pain Assessment: Severity of Chronic pain is reported as a 5 /10. Location: Back Lower, Left, Right/into right thigh. Onset: More than a month ago. Quality: Dull, Discomfort, Numbness, Constant. Timing: Constant. Modifying factor(s): medications, rest. Vitals:  height is 5\' 9"  (1.753 m) and weight is 300 lb (136.1 kg). Her temporal temperature is 97.3 F (36.3 C) (abnormal). Her blood pressure is 162/86 (abnormal) and her pulse is 69. Her respiration is 16 and oxygen saturation is  99%.  BMI: Estimated body mass index is 44.3 kg/m as calculated from the following:   Height as of this encounter: 5\' 9"  (1.753 m).   Weight as of this encounter: 300 lb (136.1 kg).  Reason for encounter: medication management.  Patient unfortunately had a ED visit on 01/17/2023 for dizziness.  Concern for stroke.  EKG was normal.  MRI and CT of the head were also negative.  She states that she still dealing with dizziness and is on meclizine.  She has not seen a neurologist yet. She also endorses worsening axial low back pain secondary to lumbar facet arthropathy and spondylosis.  She is status post left L3, L4, L5 RFA on 05/03/2020 and right L3, L4, L5 RFA on 04/14/2020.  These provided her with over 75% pain relief for 16 months with gradual return of pain.  She would like to repeat these. We will refill her Lyrica, Robaxin and oxycodone as below, no change in dose. Renew UDS.  Pharmacotherapy Assessment  Analgesic: Percocet 5 mg 3 times daily as needed, quantity 90/month; MME equals 22.5    Monitoring: [place] PMP: PDMP reviewed during this encounter.       Pharmacotherapy: No side-effects or adverse reactions reported. Compliance: No problems identified. Effectiveness: Clinically acceptable.  Vernie Ammons, RN  01/25/2023 10:40 AM  Sign when Signing Visit Patient was seen in ED 01/17/23 for dizziness that was dx as vertigo.  She was treated with meclinzine and a steroid taper.  Dizziness is better but she still feels a little off.  Continues with Meclizine.   Vernie Ammons, RN  01/25/2023 10:37 AM  Sign when Signing Visit Nursing Pain Medication Assessment:  Safety precautions to be maintained throughout the outpatient stay will include: orient  to surroundings, keep bed in low position, maintain call bell within reach at all times, provide assistance with transfer out of bed and ambulation.  Medication Inspection Compliance: Candace Clayton did not comply with our request to bring her  pills to be counted. She was reminded that bringing the medication bottles, even when empty, is a requirement.  Medication: None brought in. Pill/Patch Count: None available to be counted. Bottle Appearance: No container available. Did not bring bottle(s) to appointment. Filled Date: N/A Last Medication intake:  Today    No results found for: "CBDTHCR" No results found for: "D8THCCBX" No results found for: "D9THCCBX"  UDS:  Summary  Date Value Ref Range Status  01/05/2022 Note  Final    Comment:    ==================================================================== [employer] Select 13 (MW) ==================================================================== Test                             Result       Flag       Units  Drug Present and Declared for Prescription Verification   Oxycodone                      3048         EXPECTED   ng/mg creat   Oxymorphone                    782          EXPECTED   ng/mg creat   Noroxycodone                   1433         EXPECTED   ng/mg creat   Noroxymorphone                 232          EXPECTED   ng/mg creat    Sources of oxycodone are scheduled prescription medications.    Oxymorphone, noroxycodone, and noroxymorphone are expected    metabolites of oxycodone. Oxymorphone is also available as a    scheduled prescription medication.  ==================================================================== Test                      Result    Flag   Units      Ref Range   Creatinine              66               mg/dL      >=16 ==================================================================== Declared Medications:  The flagging and interpretation on this report are based on the  following declared medications.  Unexpected results may arise from  inaccuracies in the declared medications.   **Note: The testing scope of this panel includes these medications:   Oxycodone (Percocet)   **Note: The testing scope of this panel does not include  the  following reported medications:   Acetaminophen (Percocet)  Albuterol (Ventolin HFA)  Albuterol (Duoneb)  Amlodipine (Norvasc)  Budesonide (Breztri Aerosphere)  Buspirone (Buspar)  Chlorthalidone (Hygroton)  Desvenlafaxine (Pristiq)  Docusate (Stool Softener)  Dulaglutide (Trulicity)  Fluticasone (Flonase)  Formoterol (Breztri Aerosphere)  Glycopyrrolate (Breztri Aerosphere)  Hydrochlorothiazide (Hydrodiuril)  Ipratropium (Duoneb)  Metformin (Glucophage)  Methocarbamol (Robaxin)  Metoprolol (Toprol)  Minoxidil (Loniten)  Multivitamin  Pregabalin (Lyrica)  Rosuvastatin (Crestor)  Vitamin D2 (Drisdol) ==================================================================== For clinical consultation, please call [phone]. ====================================================================  ROS  Constitutional: Denies any fever or chills Gastrointestinal: No reported hemesis, hematochezia, vomiting, or acute GI distress Musculoskeletal: Low back pain Neurological: No reported episodes of acute onset apraxia, aphasia, dysarthria, agnosia, amnesia, paralysis, loss of coordination, or loss of consciousness  Medication Review  Budeson-Glycopyrrol-Formoterol, Microlet Lancets, Multiple Vitamins-Minerals, OVER THE COUNTER MEDICATION, Semaglutide (1 MG/DOSE), Vitamin D (Ergocalciferol), albuterol, amLODipine, busPIRone, chlorthalidone, clobetasol ointment, desvenlafaxine, fluticasone, glucose blood, hydrochlorothiazide, ipratropium-albuterol, meclizine, metFORMIN, methocarbamol, metoprolol succinate, minoxidil, ondansetron, oxyCODONE, oxyCODONE-acetaminophen, predniSONE, pregabalin, and rosuvastatin  History Review  Allergy: Candace Clayton has No Known Allergies. Drug: Candace Clayton  reports no history of drug use. Alcohol:  reports no history of alcohol use. Tobacco:  reports that she has never smoked. She has never used smokeless tobacco. Social: Candace Clayton  reports  that she has never smoked. She has never used smokeless tobacco. She reports that she does not drink alcohol and does not use drugs. Medical:  has a past medical history of Anginal pain (HCC), Anxiety, Cholecystolithiasis, Depression, Diabetes mellitus without complication (HCC), GERD (gastroesophageal reflux disease), History of C-section (1992), Hypertension, and Sleep apnea. Surgical: Candace Clayton  has a past surgical history that includes Cesarean section (1992); Finger surgery (Left, 2011); Hematoma evacuation (Left, 1999); Colonoscopy; Laparoscopic salpingo oophorectomy (Left, 04/02/2018); Colonoscopy with propofol (N/A, 03/14/2019); and Cholecystectomy (10/26/2020). Family: family history includes Brain cancer in her mother; Breast cancer (age of onset: 31) in her mother; Cancer - Colon in her mother; Cancer - Other in her mother; Colon cancer in her brother and mother; Heart Problems in her brother and father.  Laboratory Chemistry Profile   Renal Lab Results  Component Value Date   BUN 14 12/04/2022   CREATININE 0.83 12/04/2022   BCR 17 12/04/2022   GFRAA 106 02/23/2020   GFRNONAA >60 02/21/2022    Hepatic Lab Results  Component Value Date   AST 12 12/04/2022   ALT 18 12/04/2022   ALBUMIN 4.3 12/04/2022   ALKPHOS 99 12/04/2022   LIPASE 26 02/21/2022    Electrolytes Lab Results  Component Value Date   NA 139 12/04/2022   K 4.8 12/04/2022   CL 102 12/04/2022   CALCIUM 9.4 12/04/2022    Bone Lab Results  Component Value Date   VD25OH 35.6 10/02/2022    Inflammation (CRP: Acute Phase) (ESR: Chronic Phase) Lab Results  Component Value Date   CRP 2 05/24/2021   ESRSEDRATE 12 05/24/2021         Note: Above Lab results reviewed.  Physical Exam  General appearance: Well nourished, well developed, and well hydrated. In no apparent acute distress Mental status: Alert, oriented x 3 (person, place, & time)       Respiratory: No evidence of acute respiratory distress Eyes:  PERLA Vitals: BP (!) 162/86 (BP Location: Left Arm, Patient Position: Sitting, Cuff Size: Normal) Comment (Cuff Size): forearm  Pulse 69   Temp (!) 97.3 F (36.3 C) (Temporal)   Resp 16   Ht 5\' 9"  (1.753 m)   Wt 300 lb (136.1 kg)   SpO2 99%   BMI 44.30 kg/m  BMI: Estimated body mass index is 44.3 kg/m as calculated from the following:   Height as of this encounter: 5\' 9"  (1.753 m).   Weight as of this encounter: 300 lb (136.1 kg). Ideal: Ideal body weight: 66.2 kg (145 lb 15.1 oz) Adjusted ideal body weight: 94.2 kg (207 lb 9.1 oz)   Lumbar Spine Area Exam  Skin & Axial Inspection: No masses, redness, or swelling  Alignment: Symmetrical Functional ROM: Restricted ROM       Stability: No instability detected Muscle Tone/Strength: Functionally intact. No obvious neuro-muscular anomalies detected. Sensory (Neurological): Musculoskeletal pain pattern   Provocative Tests: Hyperextension/rotation test: Positive bilaterally for facet joint pain. Lumbar quadrant test (Kemp's test): Positive bilaterally for facet joint pain  Ambulation: Unassisted Gait: Relatively normal for age and body habitus Posture: WNL  Lower Extremity Exam      Side: Right lower extremity   Side: Left lower extremity  Stability: No instability observed           Stability: No instability observed          Skin & Extremity Inspection: Skin color, temperature, and hair growth are WNL. No peripheral edema or cyanosis. No masses, redness, swelling, asymmetry, or associated skin lesions. No contractures.   Skin & Extremity Inspection: Skin color, temperature, and hair growth are WNL. No peripheral edema or cyanosis. No masses, redness, swelling, asymmetry, or associated skin lesions. No contractures.  Functional ROM: Unrestricted ROM                   Functional ROM: Unrestricted ROM                  Muscle Tone/Strength: Functionally intact. No obvious neuro-muscular anomalies detected.   Muscle Tone/Strength:  Functionally intact. No obvious neuro-muscular anomalies detected.  Sensory (Neurological): Unimpaired         Sensory (Neurological): Unimpaired        DTR: Patellar: deferred today Achilles: deferred today Plantar: deferred today   DTR: Patellar: deferred today Achilles: deferred today Plantar: deferred today  Palpation: No palpable anomalies   Palpation: No palpable anomalies       Assessment   Diagnosis Status  1. Lumbar facet arthropathy    2. Lumbar spondylosis   3. Lumbar degenerative disc disease   4. Chronic pain syndrome   5. Cervical radicular pain (right C5/6)      Persistent Having a Flare-up Controlled    Plan of Care  1. Lumbar facet arthropathy  - Radiofrequency,Lumbar; Future - oxyCODONE-acetaminophen (PERCOCET/ROXICET) 5-325 MG tablet; Take 1 tablet by mouth every 8 (eight) hours as needed for severe pain.  Dispense: 90 tablet; Refill: 0 - oxyCODONE-acetaminophen (PERCOCET/ROXICET) 5-325 MG tablet; Take 1 tablet by mouth every 8 (eight) hours as needed for severe pain.  Dispense: 90 tablet; Refill: 0 - oxyCODONE-acetaminophen (PERCOCET/ROXICET) 5-325 MG tablet; Take 1 tablet by mouth every 8 (eight) hours as needed for severe pain.  Dispense: 90 tablet; Refill: 0 - pregabalin (LYRICA) 100 MG capsule; 100 mg qAM, 200 mg qhs  Dispense: 90 capsule; Refill: 5  2. Lumbar spondylosis - Radiofrequency,Lumbar; Future - pregabalin (LYRICA) 100 MG capsule; 100 mg qAM, 200 mg qhs  Dispense: 90 capsule; Refill: 5  3. Lumbar degenerative disc disease - oxyCODONE-acetaminophen (PERCOCET/ROXICET) 5-325 MG tablet; Take 1 tablet by mouth every 8 (eight) hours as needed for severe pain.  Dispense: 90 tablet; Refill: 0 - oxyCODONE-acetaminophen (PERCOCET/ROXICET) 5-325 MG tablet; Take 1 tablet by mouth every 8 (eight) hours as needed for severe pain.  Dispense: 90 tablet; Refill: 0 - oxyCODONE-acetaminophen (PERCOCET/ROXICET) 5-325 MG tablet; Take 1 tablet by mouth every 8  (eight) hours as needed for severe pain.  Dispense: 90 tablet; Refill: 0 - pregabalin (LYRICA) 100 MG capsule; 100 mg qAM, 200 mg qhs  Dispense: 90 capsule; Refill: 5  4. Chronic pain syndrome - Radiofrequency,Lumbar; Future - oxyCODONE-acetaminophen (PERCOCET/ROXICET) 5-325 MG tablet; Take 1 tablet  by mouth every 8 (eight) hours as needed for severe pain.  Dispense: 90 tablet; Refill: 0 - oxyCODONE-acetaminophen (PERCOCET/ROXICET) 5-325 MG tablet; Take 1 tablet by mouth every 8 (eight) hours as needed for severe pain.  Dispense: 90 tablet; Refill: 0 - oxyCODONE-acetaminophen (PERCOCET/ROXICET) 5-325 MG tablet; Take 1 tablet by mouth every 8 (eight) hours as needed for severe pain.  Dispense: 90 tablet; Refill: 0 - pregabalin (LYRICA) 100 MG capsule; 100 mg qAM, 200 mg qhs  Dispense: 90 capsule; Refill: 5 - ToxASSURE Select 13 (MW), Urine  5. Cervical radicular pain (right C5/6) - pregabalin (LYRICA) 100 MG capsule; 100 mg qAM, 200 mg qhs  Dispense: 90 capsule; Refill: 5   Orders:  Orders Placed This Encounter  Procedures   Radiofrequency,Lumbar    Standing Status:   Future    Standing Expiration Date:   04/27/2023    Scheduling Instructions:     Side(s): Bilateral     Level(s): L3, L4, L5, Medial Branch Nerve(s)     Sedation: With Sedation     Scheduling Timeframe: As soon as pre-approved    Order Specific Question:   Where will this procedure be performed?    Answer:   [facility]   [employer] Select 13 (MW), Urine    Volume: 30 ml(s). Minimum 3 ml of urine is needed. Document temperature of fresh sample. Indications: Long term (current) use of opiate analgesic (W29.562)    Order Specific Question:   Release to patient    Answer:   Immediate   Follow-up plan:   Return in about 11 days (around 02/05/2023) for B/L L3, 4, 5 RFA, in clinic IV Versed.      Recent Visits No visits were found meeting these conditions. Showing recent visits within past 90 days and meeting  all other requirements Today's Visits Date Type Provider Dept  01/25/23 Office Visit Edward Jolly, MD [facility]  Showing today's visits and meeting all other requirements Future Appointments No visits were found meeting these conditions. Showing future appointments within next 90 days and meeting all other requirements  I discussed the assessment and treatment plan with the patient. The patient was provided an opportunity to ask questions and all were answered. The patient agreed with the plan and demonstrated an understanding of the instructions.  Patient advised to call back or seek an in-person evaluation if the symptoms or condition worsens.  Duration of encounter: .  Total time on encounter, as per AMA guidelines included both the face-to-face and non-face-to-face time personally spent by the physician and/or other qualified health care professional(s) on the day of the encounter (includes time in activities that require the physician or other qualified health care professional and does not include time in activities normally performed by clinical staff). Physician's time may include the following activities when performed: Preparing to see the patient (e.g., pre-charting review of records, searching for previously ordered imaging, lab work, and nerve conduction tests) Review of prior analgesic pharmacotherapies. Reviewing PMP Interpreting ordered tests (e.g., lab work, imaging, nerve conduction tests) Performing post-procedure evaluations, including interpretation of diagnostic procedures Obtaining and/or reviewing separately obtained history Performing a medically appropriate examination and/or evaluation Counseling and educating the patient/family/caregiver Ordering medications, tests, or procedures Referring and communicating with other health care professionals (when not separately reported) Documenting clinical information in the electronic or other health  record Independently interpreting results (not separately reported) and communicating results to the patient/ family/caregiver Care coordination (not separately reported)  Note by: Edward Jolly,  MD Date: 01/25/2023; Time: 11:12 AM

## 2023-01-25 NOTE — Progress Notes (Signed)
Patient was seen in ED 01/17/23 for dizziness that was dx as vertigo.  She was treated with meclinzine and a steroid taper.  Dizziness is better but she still feels a little off.  Continues with Meclizine.

## 2023-01-29 ENCOUNTER — Telehealth: Payer: Self-pay

## 2023-01-30 NOTE — Telephone Encounter (Signed)
Patient is coming into office to be seen.

## 2023-02-01 ENCOUNTER — Telehealth: Payer: Self-pay | Admitting: Nurse Practitioner

## 2023-02-01 ENCOUNTER — Encounter: Payer: Self-pay | Admitting: Nurse Practitioner

## 2023-02-01 ENCOUNTER — Telehealth: Payer: No Typology Code available for payment source | Admitting: Nurse Practitioner

## 2023-02-01 DIAGNOSIS — H8121 Vestibular neuronitis, right ear: Secondary | ICD-10-CM | POA: Diagnosis not present

## 2023-02-01 MED ORDER — MECLIZINE HCL 50 MG PO TABS: 50.0000 mg | ORAL_TABLET | Freq: Three times a day (TID) | ORAL | 3 refills | Status: DC | PRN

## 2023-02-01 NOTE — Telephone Encounter (Signed)
Lvm to move up or r/s 02/01/23 appointment due to weather-Toni

## 2023-02-01 NOTE — Progress Notes (Signed)
[facility] [address] [place], [place] [address]  Internal MEDICINE  Telephone Visit  Patient Name: Candace Clayton  [id number]  [id number]  Date of Service: 02/01/2023  I connected with the patient at 1315 by telephone and verified the patients identity using two identifiers.   I discussed the limitations, risks, security and privacy concerns of performing an evaluation and management service by telephone and the availability of in person appointments. I also discussed with the patient that there may be a patient responsible charge related to the service.  The patient expressed understanding and agrees to proceed.    Chief Complaint  Patient presents with   Telephone Screen    dizzy   Telephone Assessment    HPI Candace Clayton presents for a telehealth virtual visit for dizziness, vertigo.  States she cannot walk straight.  She was seen last week in the office for vertigo and it has not improved per patient. She was prescribed meclizine last week after being seen in ER and treated with a medrol dosepack.  Right side feels unsteady, right side of face feels numb and she hears crickets in her right ear. Leans to the right side when walking. Denies any hearing loss. Some nausea, no vomiting.     Current Medication: Outpatient Encounter Medications as of 02/01/2023  Medication Sig   albuterol (VENTOLIN HFA) 108 (90 Base) MCG/ACT inhaler TAKE 2 PUFFS BY MOUTH EVERY 6 HOURS AS NEEDED FOR WHEEZE OR SHORTNESS OF BREATH   amLODipine (NORVASC) 10 MG tablet TAKE 1 TABLET BY MOUTH EVERY DAY   Budeson-Glycopyrrol-Formoterol (BREZTRI AEROSPHERE) 160-9-4.8 MCG/ACT AERO Inhale 2 puffs into the lungs 2 (two) times daily.   busPIRone (BUSPAR) 15 MG tablet TAKE 1 TABLET (15 MG TOTAL) BY MOUTH 3 (THREE) TIMES DAILY FOR ANXIETY.   chlorthalidone (HYGROTON) 25 MG tablet TAKE 1 TABLET (25 MG TOTAL) BY MOUTH DAILY.   clobetasol ointment (TEMOVATE) 0.05 % Apply 1 Application topically daily. To  affected area until resolved.   desvenlafaxine (PRISTIQ) 100 MG 24 hr tablet TAKE 1 TABLET BY MOUTH EVERY DAY   fluticasone (FLONASE) 50 MCG/ACT nasal spray Place 2 sprays into both nostrils daily.   glucose blood (CONTOUR NEXT TEST) test strip 1 each by Other route daily. DX E11.65   hydrochlorothiazide (HYDRODIURIL) 25 MG tablet TAKE 1 TABLET (25 MG TOTAL) BY MOUTH DAILY.   ipratropium-albuterol (DUONEB) 0.5-2.5 (3) MG/3ML SOLN Take 3 mLs by nebulization every 4 (four) hours as needed (shortness of breath or wheezing).   meclizine (ANTIVERT) 50 MG tablet Take 1 tablet (50 mg total) by mouth 3 (three) times daily as needed for dizziness or nausea.   metFORMIN (GLUCOPHAGE) 500 MG tablet TAKE 1 TABLET BY MOUTH TWICE A DAY WITH FOOD   methocarbamol (ROBAXIN) 750 MG tablet Take 1 tablet (750 mg total) by mouth every 8 (eight) hours as needed for muscle spasms.   metoprolol succinate (TOPROL-XL) 100 MG 24 hr tablet TAKE 1 TABLET BY MOUTH EVERY DAY WITH OR IMMEDIATELY FOLLOWING A MEAL   Microlet Lancets MISC 1 each by Does not apply route daily. DX-E11.65   minoxidil (LONITEN) 10 MG tablet TAKE 1 TABLET BY MOUTH EVERY DAY   Multiple Vitamins-Calcium (ONE-A-DAY WOMENS PO) Take 1 tablet by mouth daily.   ondansetron (ZOFRAN-ODT) 8 MG disintegrating tablet TAKE 1 TABLET EVERY 8 HOURS AS NEEDED FOR NAUSEA OR VOMITING. LET THE TABLET DISSOLVE IN MOUTH   OVER THE COUNTER MEDICATION 1 tablet daily as needed. Takes generic stool softener  for constipation d/t oxycodone.    oxyCODONE (OXY IR/ROXICODONE) 5 MG immediate release tablet Take by mouth.   oxyCODONE-acetaminophen (PERCOCET/ROXICET) 5-325 MG tablet Take 1 tablet by mouth every 8 (eight) hours as needed for severe pain.   [START ON 02/27/2023] oxyCODONE-acetaminophen (PERCOCET/ROXICET) 5-325 MG tablet Take 1 tablet by mouth every 8 (eight) hours as needed for severe pain.   [START ON 03/29/2023] oxyCODONE-acetaminophen (PERCOCET/ROXICET) 5-325 MG tablet Take  1 tablet by mouth every 8 (eight) hours as needed for severe pain.   predniSONE (STERAPRED UNI-PAK 21 TAB) 10 MG (21) TBPK tablet Use as directed for 6 days   pregabalin (LYRICA) 100 MG capsule 100 mg qAM, 200 mg qhs   rosuvastatin (CRESTOR) 5 MG tablet TAKE 1 TABLET (5 MG TOTAL) BY MOUTH DAILY.   Semaglutide, 1 MG/DOSE, (OZEMPIC, 1 MG/DOSE,) 4 MG/3ML SOPN Inject 1 mg into the skin once a week.   Vitamin D, Ergocalciferol, (DRISDOL) 1.25 MG (50000 UNIT) CAPS capsule TAKE 1 CAPSULE BY MOUTH ONE TIME PER WEEK   [DISCONTINUED] meclizine (ANTIVERT) 12.5 MG tablet Take 1-2 tablets (12.5-25 mg total) by mouth 3 (three) times daily as needed for dizziness.   No facility-administered encounter medications on file as of 02/01/2023.    Surgical History: Past Surgical History:  Procedure Laterality Date   CESAREAN SECTION  1992   CHOLECYSTECTOMY  10/26/2020   COLONOSCOPY     polyps removed first procedure. 2nd time all was clear   COLONOSCOPY WITH PROPOFOL N/A 03/14/2019   Procedure: COLONOSCOPY WITH PROPOFOL;  Surgeon: Toney Reil, MD;  Location: [facility] ENDOSCOPY;  Service: Gastroenterology;  Laterality: N/A;   FINGER SURGERY Left 2011   left finger cut off x 2.(only up to last digit)   HEMATOMA EVACUATION Left 1999   upper part of foot was injured d/t 500lb weight landing on her foot.    LAPAROSCOPIC SALPINGO OOPHERECTOMY Left 04/02/2018   Procedure: LAPAROSCOPIC SALPINGO OOPHORECTOMY;  Surgeon: Vena Austria, MD;  Location: [facility] ORS;  Service: Gynecology;  Laterality: Left;    Medical History: Past Medical History:  Diagnosis Date   Anginal pain (HCC)    tightness related to anxiety   Anxiety    Cholecystolithiasis    Depression    Diabetes mellitus without complication (HCC)    GERD (gastroesophageal reflux disease)    throws up easily but not diagnosed with reflux   History of C-section 1992   Hypertension    Sleep apnea    uses cpap    Family History: Family History   Problem Relation Age of Onset   Breast cancer Mother 40       x 3 times   Colon cancer Mother    Brain cancer Mother    Cancer - Colon Mother    Cancer - Other Mother    Heart Problems Father    Colon cancer Brother    Heart Problems Brother     Social History   Socioeconomic History   Marital status: Widowed    Spouse name: Not on file   Number of children: 1   Years of education: Not on file   Highest education level: Not on file  Occupational History   Occupation: works in Programme researcher, broadcasting/film/video  Tobacco Use   Smoking status: Never   Smokeless tobacco: Never  Vaping Use   Vaping status: Never Used  Substance and Sexual Activity   Alcohol use: No   Drug use: No   Sexual activity: Not Currently    Birth  control/protection: Post-menopausal  Other Topics Concern   Not on file  Social History Narrative   Son has schizophrenia and autism but is fully capable of helping mother after surgery   Social Determinants of Health   Financial Resource Strain: Not on file  Food Insecurity: Not on file  Transportation Needs: Not on file  Physical Activity: Not on file  Stress: Not on file  Social Connections: Not on file  Intimate Partner Violence: Not on file      Review of Systems  Constitutional:  Negative for chills, fatigue and unexpected weight change.  HENT:  Negative for congestion, sneezing and sore throat.   Respiratory: Negative.  Negative for cough, chest tightness, shortness of breath and wheezing.   Cardiovascular: Negative.  Negative for chest pain and palpitations.  Gastrointestinal:  Negative for abdominal pain, constipation, diarrhea, nausea and vomiting.  Musculoskeletal:  Positive for arthralgias, back pain and joint swelling (right knee). Negative for gait problem and neck pain.  Skin:  Negative for rash.  Neurological:  Positive for dizziness.  Psychiatric/Behavioral:  Negative for behavioral problems (Depression), sleep disturbance and suicidal ideas.  The patient is not nervous/anxious.     Vital Signs: Resp 16   Ht 5\' 9"  (1.753 m)   Wt 300 lb (136.1 kg)   BMI 44.30 kg/m    Observation/Objective: She is alert and oriented and engages in conversation appropriately. No acute distress noted.     Assessment/Plan: 1. Acute vestibular neuronitis of right ear Referred to ENT urgently, and meclizine dose increased.  - Ambulatory referral to ENT - meclizine (ANTIVERT) 50 MG tablet; Take 1 tablet (50 mg total) by mouth 3 (three) times daily as needed for dizziness or nausea.  Dispense: 90 tablet; Refill: 3   General Counseling: Candace Clayton verbalizes understanding of the findings of today's phone visit and agrees with plan of treatment. I have discussed any further diagnostic evaluation that may be needed or ordered today. We also reviewed her medications today. she has been encouraged to call the office with any questions or concerns that should arise related to todays visit.  Return for F/U,  PCP in a few weeks before returning to work. .   Orders Placed This Encounter  Procedures   Ambulatory referral to ENT    Meds ordered this encounter  Medications   meclizine (ANTIVERT) 50 MG tablet    Sig: Take 1 tablet (50 mg total) by mouth 3 (three) times daily as needed for dizziness or nausea.    Dispense:  90 tablet    Refill:  3    Time spent:20 Minutes Time spent with patient included reviewing progress notes, labs, imaging studies, and discussing plan for follow up.  [place] Controlled Substance Database was reviewed by me for overdose risk score (ORS) if appropriate.  This patient was seen by Sallyanne Kuster, FNP-C in collaboration with Dr. Beverely Risen as a part of collaborative care agreement.   R. Tedd Sias, MSN, FNP-C Internal medicine

## 2023-02-02 ENCOUNTER — Other Ambulatory Visit: Payer: Self-pay | Admitting: Nurse Practitioner

## 2023-02-02 DIAGNOSIS — E559 Vitamin D deficiency, unspecified: Secondary | ICD-10-CM

## 2023-02-06 ENCOUNTER — Ambulatory Visit (INDEPENDENT_AMBULATORY_CARE_PROVIDER_SITE_OTHER): Payer: No Typology Code available for payment source

## 2023-02-06 ENCOUNTER — Telehealth: Payer: Self-pay | Admitting: Nurse Practitioner

## 2023-02-06 DIAGNOSIS — E538 Deficiency of other specified B group vitamins: Secondary | ICD-10-CM

## 2023-02-06 MED ORDER — CYANOCOBALAMIN 1000 MCG/ML IJ SOLN
1000.0000 ug | Freq: Once | INTRAMUSCULAR | Status: AC
Start: 2023-02-06 — End: 2023-02-06
  Administered 2023-02-06: 1000 ug via INTRAMUSCULAR

## 2023-02-06 NOTE — Telephone Encounter (Signed)
Otolaryngology referral sent via [facility] to [facility] 

## 2023-02-07 ENCOUNTER — Telehealth: Payer: Self-pay | Admitting: Nurse Practitioner

## 2023-02-07 NOTE — Telephone Encounter (Signed)
Otolaryngology appointment 02/13/2023 @ [facility]

## 2023-02-12 ENCOUNTER — Ambulatory Visit
Admission: RE | Admit: 2023-02-12 | Discharge: 2023-02-12 | Disposition: A | Discharge status: Home / Self Care | Payer: No Typology Code available for payment source | Source: Ambulatory Visit | Attending: Student in an Organized Health Care Education/Training Program | Admitting: Student in an Organized Health Care Education/Training Program

## 2023-02-12 ENCOUNTER — Ambulatory Visit
Payer: No Typology Code available for payment source | Attending: Student in an Organized Health Care Education/Training Program | Admitting: Student in an Organized Health Care Education/Training Program

## 2023-02-12 ENCOUNTER — Telehealth: Payer: Self-pay | Admitting: Nurse Practitioner

## 2023-02-12 ENCOUNTER — Encounter: Payer: Self-pay | Admitting: Student in an Organized Health Care Education/Training Program

## 2023-02-12 DIAGNOSIS — G894 Chronic pain syndrome: Secondary | ICD-10-CM | POA: Insufficient documentation

## 2023-02-12 DIAGNOSIS — M47816 Spondylosis without myelopathy or radiculopathy, lumbar region: Secondary | ICD-10-CM

## 2023-02-12 MED ORDER — LIDOCAINE HCL 2 % IJ SOLN
20.0000 mL | Freq: Once | INTRAMUSCULAR | Status: AC
  Administered 2023-02-12: 400 mg

## 2023-02-12 MED ORDER — DEXAMETHASONE SODIUM PHOSPHATE 10 MG/ML IJ SOLN
10.0000 mg | Freq: Once | INTRAMUSCULAR | Status: AC
  Administered 2023-02-12: 10 mg

## 2023-02-12 MED ORDER — ROPIVACAINE HCL 2 MG/ML IJ SOLN
9.0000 mL | Freq: Once | INTRAMUSCULAR | Status: AC
  Administered 2023-02-12: 9 mL via PERINEURAL

## 2023-02-12 MED ORDER — ROPIVACAINE HCL 2 MG/ML IJ SOLN
INTRAMUSCULAR | Status: AC
  Filled 2023-02-12: qty 20

## 2023-02-12 MED ORDER — LIDOCAINE HCL 2 % IJ SOLN
INTRAMUSCULAR | Status: AC
  Filled 2023-02-12: qty 20

## 2023-02-12 MED ORDER — DEXAMETHASONE SODIUM PHOSPHATE 10 MG/ML IJ SOLN
INTRAMUSCULAR | Status: AC
  Filled 2023-02-12: qty 2

## 2023-02-12 NOTE — Telephone Encounter (Signed)
Received [employer] disability form claim# [id number] and claim# [id number] for [employer] signature. [employer] disability form's signed. Faxed back; claim# MLE- [id number], and [id number] to [phone]. To be scanned-nm

## 2023-02-12 NOTE — Progress Notes (Signed)
Safety precautions to be maintained throughout the outpatient stay will include: orient to surroundings, keep bed in low position, maintain call bell within reach at all times, provide assistance with transfer out of bed and ambulation.  

## 2023-02-12 NOTE — Progress Notes (Signed)
PROVIDER NOTE: Information contained herein reflects review and annotations entered in association with encounter. Interpretation of such information and data should be left to medically-trained personnel. Information provided to patient can be located elsewhere in the medical record under "Patient Instructions". Document created using STT-dictation technology, any transcriptional errors that may result from process are unintentional.    Patient: Candace Clayton  Service Category: Procedure  Provider: Edward Jolly, MD  DOB: [birth year]  DOS: 02/12/2023  Location: [facility]  MRN: [id number]  Setting: Ambulatory - outpatient  Referring Provider: Edward Jolly, MD  Type: Established Patient  Specialty: Interventional Pain Management  PCP: Sallyanne Kuster, NP   Primary Reason for Visit: Interventional Pain Management Treatment. CC: Back Pain (Lumbar bilateral )     Procedure:          Anesthesia, Analgesia, Anxiolysis:  Type: Thermal Lumbar Facet, Medial Branch Radiofrequency Ablation/Neurotomy           Primary Purpose: Therapeutic Region: Posterolateral Lumbosacral Spine Level: L3, L4, L5, Medial Branch Level(s). These levels will denervate the L3-4, L4-5, lumbar facet joints. Laterality: RIGHT & LEFT  Type: Local Anesthesia  Local Anesthetic: Lidocaine 1-2%     Position: Prone   Indications: 1. Lumbar facet arthropathy    2. Lumbar spondylosis   3. Chronic pain syndrome    Candace Clayton has been dealing with the above chronic pain for longer than three months and has either failed to respond, was unable to tolerate, or simply did not get enough benefit from other more conservative therapies including, but not limited to: 1. Over-the-counter medications 2. Anti-inflammatory medications 3. Muscle relaxants 4. Membrane stabilizers 5. Opioids 6. Physical therapy and/or chiropractic manipulation 7. Modalities (Heat, ice, etc.) 8. Invasive techniques such as nerve  blocks. Candace Clayton has attained more than 50% relief of the pain from a series of diagnostic injections conducted in separate occasions.  Pain Score: Pre-procedure: 5 /10 Post-procedure: 5 /10    Pre-op H&P Assessment:  Candace Clayton is a 62 y.o. (year old), female patient, seen today for interventional treatment. She  has a past surgical history that includes Cesarean section (1992); Finger surgery (Left, 2011); Hematoma evacuation (Left, 1999); Colonoscopy; Laparoscopic salpingo oophorectomy (Left, 04/02/2018); Colonoscopy with propofol (N/A, 03/14/2019); and Cholecystectomy (10/26/2020). Candace Clayton has a current medication list which includes the following prescription(s): albuterol, amlodipine, breztri aerosphere, buspirone, chlorthalidone, clobetasol ointment, desvenlafaxine, fluticasone, contour next test, hydrochlorothiazide, ipratropium-albuterol, meclizine, metformin, methocarbamol, metoprolol succinate, microlet lancets, minoxidil, multiple vitamins-minerals, ondansetron, OVER THE COUNTER MEDICATION, oxycodone, oxycodone-acetaminophen, [START ON 02/27/2023] oxycodone-acetaminophen, [START ON 03/29/2023] oxycodone-acetaminophen, pregabalin, rosuvastatin, ozempic (1 mg/dose), vitamin d (ergocalciferol), and prednisone. Her primarily concern today is the Back Pain (Lumbar bilateral )   Initial Vital Signs:  Pulse/HCG Rate: 71ECG Heart Rate: 72 Temp: (!) 97.5 F (36.4 C) Resp: 16 BP: (!) 140/71 SpO2: 97 %  BMI: Estimated body mass index is 44.3 kg/m as calculated from the following:   Height as of this encounter: 5\' 9"  (1.753 m).   Weight as of this encounter: 300 lb (136.1 kg).  Risk Assessment: Allergies: Reviewed. She has No Known Allergies.  Allergy Precautions: None required Coagulopathies: Reviewed. None identified.  Blood-thinner therapy: None at this time Active Infection(s): Reviewed. None identified. Candace Clayton is afebrile  Site Confirmation: Candace Clayton was asked to  confirm the procedure and laterality before marking the site Procedure checklist: Completed Consent: Before the procedure and under the influence of no sedative(s), amnesic(s), or anxiolytics, the patient was informed of  the treatment options, risks and possible complications. To fulfill our ethical and legal obligations, as recommended by the American Medical Association's Code of Ethics, I have informed the patient of my clinical impression; the nature and purpose of the treatment or procedure; the risks, benefits, and possible complications of the intervention; the alternatives, including doing nothing; the risk(s) and benefit(s) of the alternative treatment(s) or procedure(s); and the risk(s) and benefit(s) of doing nothing. The patient was provided information about the general risks and possible complications associated with the procedure. These may include, but are not limited to: failure to achieve desired goals, infection, bleeding, organ or nerve damage, allergic reactions, paralysis, and death. In addition, the patient was informed of those risks and complications associated to Spine-related procedures, such as failure to decrease pain; infection (i.e.: Meningitis, epidural or intraspinal abscess); bleeding (i.e.: epidural hematoma, subarachnoid hemorrhage, or any other type of intraspinal or peri-dural bleeding); organ or nerve damage (i.e.: Any type of peripheral nerve, nerve root, or spinal cord injury) with subsequent damage to sensory, motor, and/or autonomic systems, resulting in permanent pain, numbness, and/or weakness of one or several areas of the body; allergic reactions; (i.e.: anaphylactic reaction); and/or death. Furthermore, the patient was informed of those risks and complications associated with the medications. These include, but are not limited to: allergic reactions (i.e.: anaphylactic or anaphylactoid reaction(s)); adrenal axis suppression; blood sugar elevation that in diabetics  may result in ketoacidosis or comma; water retention that in patients with history of congestive heart failure may result in shortness of breath, pulmonary edema, and decompensation with resultant heart failure; weight gain; swelling or edema; medication-induced neural toxicity; particulate matter embolism and blood vessel occlusion with resultant organ, and/or nervous system infarction; and/or aseptic necrosis of one or more joints. Finally, the patient was informed that Medicine is not an exact science; therefore, there is also the possibility of unforeseen or unpredictable risks and/or possible complications that may result in a catastrophic outcome. The patient indicated having understood very clearly. We have given the patient no guarantees and we have made no promises. Enough time was given to the patient to ask questions, all of which were answered to the patient's satisfaction. Ms. Ives has indicated that she wanted to continue with the procedure. Attestation: I, the ordering provider, attest that I have discussed with the patient the benefits, risks, side-effects, alternatives, likelihood of achieving goals, and potential problems during recovery for the procedure that I have provided informed consent. Date  Time: 02/12/2023  9:57 AM  Pre-Procedure Preparation:  Monitoring: As per clinic protocol. Respiration, ETCO2, SpO2, BP, heart rate and rhythm monitor placed and checked for adequate function Safety Precautions: Patient was assessed for positional comfort and pressure points before starting the procedure. Time-out: I initiated and conducted the "Time-out" before starting the procedure, as per protocol. The patient was asked to participate by confirming the accuracy of the "Time Out" information. Verification of the correct person, site, and procedure were performed and confirmed by me, the nursing staff, and the patient. "Time-out" conducted as per Joint Commission's Universal Protocol  (UP.01.01.01). Time: 1025  Description of Procedure:          Laterality: Right & LEFT Levels: L3, L4, L5, Medial Branch Level(s), at the L3-4, L4-5 umbar facet joints. Area Prepped: Lumbosacral DuraPrep (Iodine Povacrylex [0.7% available iodine] and Isopropyl Alcohol, 74% w/w) Safety Precautions: Aspiration looking for blood return was conducted prior to all injections. At no point did we inject any substances, as a needle was  being advanced. Before injecting, the patient was told to immediately notify me if she was experiencing any new onset of "ringing in the ears, or metallic taste in the mouth". No attempts were made at seeking any paresthesias. Safe injection practices and needle disposal techniques used. Medications properly checked for expiration dates. SDV (single dose vial) medications used. After the completion of the procedure, all disposable equipment used was discarded in the proper designated medical waste containers. Local Anesthesia: Protocol guidelines were followed. The patient was positioned over the fluoroscopy table. The area was prepped in the usual manner. The time-out was completed. The target area was identified using fluoroscopy. A 12-in long, straight, sterile hemostat was used with fluoroscopic guidance to locate the targets for each level blocked. Once located, the skin was marked with an approved surgical skin marker. Once all sites were marked, the skin (epidermis, dermis, and hypodermis), as well as deeper tissues (fat, connective tissue and muscle) were infiltrated with a small amount of a short-acting local anesthetic, loaded on a 10cc syringe with a 25G, 1.5-in  Needle. An appropriate amount of time was allowed for local anesthetics to take effect before proceeding to the next step. Local Anesthetic: Lidocaine 2.0% The unused portion of the local anesthetic was discarded in the proper designated containers. Technical explanation of process:  Radiofrequency Ablation  (RFA) L3 Medial Branch Nerve RFA: The target area for the L3 medial branch is at the junction of the postero-lateral aspect of the superior articular process and the superior, posterior, and medial edge of the transverse process of L4. Under fluoroscopic guidance, a Radiofrequency needle was inserted until contact was made with os over the superior postero-lateral aspect of the pedicular shadow (target area). Sensory and motor testing was conducted to properly adjust the position of the needle. Once satisfactory placement of the needle was achieved, the numbing solution was slowly injected after negative aspiration for blood. 2.0 mL of the nerve block solution was injected without difficulty or complication. After waiting for at least 3 minutes, the ablation was performed. Once completed, the needle was removed intact. L4 Medial Branch Nerve RFA: The target area for the L4 medial branch is at the junction of the postero-lateral aspect of the superior articular process and the superior, posterior, and medial edge of the transverse process of L5. Under fluoroscopic guidance, a Radiofrequency needle was inserted until contact was made with os over the superior postero-lateral aspect of the pedicular shadow (target area). Sensory and motor testing was conducted to properly adjust the position of the needle. Once satisfactory placement of the needle was achieved, the numbing solution was slowly injected after negative aspiration for blood. 2.0 mL of the nerve block solution was injected without difficulty or complication. After waiting for at least 3 minutes, the ablation was performed. Once completed, the needle was removed intact. L5 Medial Branch Nerve RFA: The target area for the L5 medial branch is at the junction of the postero-lateral aspect of the superior articular process of S1 and the superior, posterior, and medial edge of the sacral ala. Under fluoroscopic guidance, a Radiofrequency needle was inserted  until contact was made with os over the superior postero-lateral aspect of the pedicular shadow (target area). Sensory and motor testing was conducted to properly adjust the position of the needle. Once satisfactory placement of the needle was achieved, the numbing solution was slowly injected after negative aspiration for blood. 2.0 mL of the nerve block solution was injected without difficulty or complication. After waiting for  at least 3 minutes, the ablation was performed. Once completed, the needle was removed intact.  Radiofrequency lesioning (ablation):  Radiofrequency Generator: NeuroTherm [id number] Sensory Stimulation Parameters: 50 Hz was used to locate & identify the nerve, making sure that the needle was positioned such that there was no sensory stimulation below 0.3 V or above 0.7 V. Motor Stimulation Parameters: 2 Hz was used to evaluate the motor component. Care was taken not to lesion any nerves that demonstrated motor stimulation of the lower extremities at an output of less than 2.5 times that of the sensory threshold, or a maximum of 2.0 V. Lesioning Technique Parameters: Standard Radiofrequency settings. (Not bipolar or pulsed.) Temperature Settings: 80 degrees C Lesioning time: 60 seconds Intra-operative Compliance: Compliant Materials & Medications: Needle(s) (Electrode/Cannula) Type: Teflon-coated, curved tip, Radiofrequency needle(s) Gauge: 22G Length: 10cm Numbing solution:12 cc solution made of 10cc of 0.2% ropivacaine, 2 cc of Decadron 10 mg/cc. 2cc injected at each level above on RIGHT & LEFT after sensorimotor testing prior to lesioning   Once the entire procedure was completed, the treated area was cleaned, making sure to leave some of the prepping solution back to take advantage of its long term bactericidal properties.    Illustration of the posterior view of the lumbar spine and the posterior neural structures. Laminae of L2 through S1 are labeled. DPRL5, dorsal  primary ramus of L5; DPRS1, dorsal primary ramus of S1; DPR3, dorsal primary ramus of L3; FJ, facet (zygapophyseal) joint L3-L4; I, inferior articular process of L4; LB1, lateral branch of dorsal primary ramus of L1; IAB, inferior articular branches from L3 medial branch (supplies L4-L5 facet joint); IBP, intermediate branch plexus; MB3, medial branch of dorsal primary ramus of L3; NR3, third lumbar nerve root; S, superior articular process of L5; SAB, superior articular branches from L4 (supplies L4-5 facet joint also); TP3, transverse process of L3.  Vitals:   02/12/23 1035 02/12/23 1040 02/12/23 1046 02/12/23 1050  BP: (!) 156/92 (!) 159/92 (!) 155/92 (!) 148/94  Pulse:      Resp: 17 16 15 16   Temp:      TempSrc:      SpO2: 95% 95% 94% 95%  Weight:      Height:       Start Time: 1025 hrs. End Time: 1050 hrs.  Imaging Guidance (Spinal):          Type of Imaging Technique: Fluoroscopy Guidance (Spinal) Indication(s): Assistance in needle guidance and placement for procedures requiring needle placement in or near specific anatomical locations not easily accessible without such assistance. Exposure Time: Please see nurses notes. Contrast: None used. Fluoroscopic Guidance: I was personally present during the use of fluoroscopy. "Tunnel Vision Technique" used to obtain the best possible view of the target area. Parallax error corrected before commencing the procedure. "Direction-depth-direction" technique used to introduce the needle under continuous pulsed fluoroscopy. Once target was reached, antero-posterior, oblique, and lateral fluoroscopic projection used confirm needle placement in all planes. Images permanently stored in EMR. Interpretation: No contrast injected. I personally interpreted the imaging intraoperatively. Adequate needle placement confirmed in multiple planes. Permanent images saved into the patient's record.   Post-operative Assessment:  Post-procedure Vital Signs:   Pulse/HCG Rate: 7172 Temp: (!) 97.5 F (36.4 C) Resp: 16 BP: (!) 148/94 SpO2: 95 %  EBL: None  Complications: No immediate post-treatment complications observed by team, or reported by patient.  Note: The patient tolerated the entire procedure well. A repeat set of vitals were taken after the procedure and  the patient was kept under observation following institutional policy, for this type of procedure. Post-procedural neurological assessment was performed, showing return to baseline, prior to discharge. The patient was provided with post-procedure discharge instructions, including a section on how to identify potential problems. Should any problems arise concerning this procedure, the patient was given instructions to immediately contact us, at any time, without hesitation. In any case, we plan to contact the patient by telephone for a follow-up status report regarding this interventional procedure.  Comments:  No additional relevant information.  Plan of Care  Orders:  Orders Placed This Encounter  Procedures   DG PAIN [facility] C-ARM 1-60 MIN NO REPORT    Intraoperative interpretation by procedural physician at [facility].    Standing Status:   Standing    Number of Occurrences:   1    Order Specific Question:   Reason for exam:    Answer:   Assistance in needle guidance and placement for procedures requiring needle placement in or near specific anatomical locations not easily accessible without such assistance.   Chronic Opioid Analgesic:  Percocet 5 mg 3 times daily as needed, quantity 90/month; MME equals 22.5    Medications ordered for procedure: Meds ordered this encounter  Medications   lidocaine (XYLOCAINE) 2 % (with pres) injection 400 mg   dexamethasone (DECADRON) injection 10 mg   dexamethasone (DECADRON) injection 10 mg   ropivacaine (PF) 2 mg/mL (0.2%) (NAROPIN) injection 9 mL   ropivacaine (PF) 2 mg/mL (0.2%) (NAROPIN) injection 9 mL   Medications  administered: We administered lidocaine, dexamethasone, dexamethasone, ropivacaine (PF) 2 mg/mL (0.2%), and ropivacaine (PF) 2 mg/mL (0.2%).  See the medical record for exact dosing, route, and time of administration.  Follow-up plan:   Return for KEEP SCHEDULED APPOINTMENT IN OCTOBER.      Recent Visits Date Type Provider Dept  01/25/23 Office Visit Edward Jolly, MD [facility]  Showing recent visits within past 90 days and meeting all other requirements Today's Visits Date Type Provider Dept  02/12/23 Procedure visit Edward Jolly, MD [facility]  Showing today's visits and meeting all other requirements Future Appointments Date Type Provider Dept  04/26/23 Appointment Edward Jolly, MD [facility]  Showing future appointments within next 90 days and meeting all other requirements  Disposition: Discharge home  Discharge (Date  Time): 02/12/2023; 1100 hrs.   Primary Care Physician: Sallyanne Kuster, NP Location: [facility] Note by: Edward Jolly, MD Date: 02/12/2023; Time: 11:35 AM  Disclaimer:  Medicine is not an exact science. The only guarantee in medicine is that nothing is guaranteed. It is important to note that the decision to proceed with this intervention was based on the information collected from the patient. The Data and conclusions were drawn from the patient's questionnaire, the interview, and the physical examination. Because the information was provided in large part by the patient, it cannot be guaranteed that it has not been purposely or unconsciously manipulated. Every effort has been made to obtain as much relevant data as possible for this evaluation. It is important to note that the conclusions that lead to this procedure are derived in large part from the available data. Always take into account that the treatment will also be dependent on availability of resources and existing treatment guidelines,  considered by other Pain Management Practitioners as being common knowledge and practice, at the time of the intervention. For Medico-Legal purposes, it is also important to point out that variation  in procedural techniques and pharmacological choices are the acceptable norm. The indications, contraindications, technique, and results of the above procedure should only be interpreted and judged by a Board-Certified Interventional Pain Specialist with extensive familiarity and expertise in the same exact procedure and technique.

## 2023-02-12 NOTE — Patient Instructions (Signed)

## 2023-02-13 ENCOUNTER — Other Ambulatory Visit: Payer: Self-pay | Admitting: Nurse Practitioner

## 2023-02-13 ENCOUNTER — Telehealth: Payer: Self-pay

## 2023-02-13 DIAGNOSIS — F411 Generalized anxiety disorder: Secondary | ICD-10-CM

## 2023-02-13 NOTE — Telephone Encounter (Signed)
Post procedure follow up.  LM 

## 2023-02-14 ENCOUNTER — Other Ambulatory Visit: Payer: Self-pay | Admitting: Student

## 2023-02-14 DIAGNOSIS — R29810 Facial weakness: Secondary | ICD-10-CM

## 2023-02-14 DIAGNOSIS — R42 Dizziness and giddiness: Secondary | ICD-10-CM

## 2023-02-20 ENCOUNTER — Ambulatory Visit
Admission: RE | Admit: 2023-02-20 | Discharge: 2023-02-20 | Disposition: A | Discharge status: Home / Self Care | Payer: No Typology Code available for payment source | Source: Ambulatory Visit | Attending: Student | Admitting: Student

## 2023-02-20 DIAGNOSIS — R29810 Facial weakness: Secondary | ICD-10-CM

## 2023-02-20 DIAGNOSIS — R42 Dizziness and giddiness: Secondary | ICD-10-CM

## 2023-02-20 MED ORDER — GADOPICLENOL 0.5 MMOL/ML IV SOLN
10.0000 mL | Freq: Once | INTRAVENOUS | Status: AC | PRN
  Administered 2023-02-20: 10 mL via INTRAVENOUS

## 2023-02-21 ENCOUNTER — Encounter: Payer: Self-pay | Admitting: Nurse Practitioner

## 2023-02-21 ENCOUNTER — Ambulatory Visit: Payer: No Typology Code available for payment source | Admitting: Nurse Practitioner

## 2023-02-21 VITALS — BP 141/71 | HR 84 | Temp 98.2°F | Resp 16 | Ht 69.0 in | Wt 314.0 lb

## 2023-02-21 DIAGNOSIS — R42 Dizziness and giddiness: Secondary | ICD-10-CM

## 2023-02-21 DIAGNOSIS — H8121 Vestibular neuronitis, right ear: Secondary | ICD-10-CM

## 2023-02-21 NOTE — Progress Notes (Signed)
[facility] [address] [place], [place] [address]  Internal MEDICINE  Office Visit Note  Patient Name: Candace Clayton  [id number]  [id number]  Date of Service: 02/21/2023  Chief Complaint  Patient presents with   Follow-up    Needs work note     HPI Candace Clayton presents for a follow-up visit for vestibular neuritis and returning to work. Return to work, seen by ENT twice and released. Vestibular neuritis has resolved.     Current Medication: Outpatient Encounter Medications as of 02/21/2023  Medication Sig   albuterol (VENTOLIN HFA) 108 (90 Base) MCG/ACT inhaler TAKE 2 PUFFS BY MOUTH EVERY 6 HOURS AS NEEDED FOR WHEEZE OR SHORTNESS OF BREATH   amLODipine (NORVASC) 10 MG tablet TAKE 1 TABLET BY MOUTH EVERY DAY   Budeson-Glycopyrrol-Formoterol (BREZTRI AEROSPHERE) 160-9-4.8 MCG/ACT AERO Inhale 2 puffs into the lungs 2 (two) times daily.   clobetasol ointment (TEMOVATE) 0.05 % Apply 1 Application topically daily. To affected area until resolved.   desvenlafaxine (PRISTIQ) 100 MG 24 hr tablet TAKE 1 TABLET BY MOUTH EVERY DAY   fluticasone (FLONASE) 50 MCG/ACT nasal spray Place 2 sprays into both nostrils daily.   glucose blood (CONTOUR NEXT TEST) test strip 1 each by Other route daily. DX E11.65   hydrochlorothiazide (HYDRODIURIL) 25 MG tablet TAKE 1 TABLET (25 MG TOTAL) BY MOUTH DAILY.   ipratropium-albuterol (DUONEB) 0.5-2.5 (3) MG/3ML SOLN Take 3 mLs by nebulization every 4 (four) hours as needed (shortness of breath or wheezing).   meclizine (ANTIVERT) 50 MG tablet Take 1 tablet (50 mg total) by mouth 3 (three) times daily as needed for dizziness or nausea.   metFORMIN (GLUCOPHAGE) 500 MG tablet TAKE 1 TABLET BY MOUTH TWICE A DAY WITH FOOD   methocarbamol (ROBAXIN) 750 MG tablet Take 1 tablet (750 mg total) by mouth every 8 (eight) hours as needed for muscle spasms.   metoprolol succinate (TOPROL-XL) 100 MG 24 hr tablet TAKE 1 TABLET BY MOUTH EVERY DAY WITH OR IMMEDIATELY  FOLLOWING A MEAL   Microlet Lancets MISC 1 each by Does not apply route daily. DX-E11.65   minoxidil (LONITEN) 10 MG tablet TAKE 1 TABLET BY MOUTH EVERY DAY   Multiple Vitamins-Calcium (ONE-A-DAY WOMENS PO) Take 1 tablet by mouth daily.   ondansetron (ZOFRAN-ODT) 8 MG disintegrating tablet TAKE 1 TABLET EVERY 8 HOURS AS NEEDED FOR NAUSEA OR VOMITING. LET THE TABLET DISSOLVE IN MOUTH   OVER THE COUNTER MEDICATION 1 tablet daily as needed. Takes generic stool softener for constipation d/t oxycodone.    oxyCODONE (OXY IR/ROXICODONE) 5 MG immediate release tablet Take by mouth.   oxyCODONE-acetaminophen (PERCOCET/ROXICET) 5-325 MG tablet Take 1 tablet by mouth every 8 (eight) hours as needed for severe pain.   oxyCODONE-acetaminophen (PERCOCET/ROXICET) 5-325 MG tablet Take 1 tablet by mouth every 8 (eight) hours as needed for severe pain.   [START ON 03/29/2023] oxyCODONE-acetaminophen (PERCOCET/ROXICET) 5-325 MG tablet Take 1 tablet by mouth every 8 (eight) hours as needed for severe pain.   predniSONE (STERAPRED UNI-PAK 21 TAB) 10 MG (21) TBPK tablet Use as directed for 6 days   pregabalin (LYRICA) 100 MG capsule 100 mg qAM, 200 mg qhs   rosuvastatin (CRESTOR) 5 MG tablet TAKE 1 TABLET (5 MG TOTAL) BY MOUTH DAILY.   Semaglutide, 1 MG/DOSE, (OZEMPIC, 1 MG/DOSE,) 4 MG/3ML SOPN Inject 1 mg into the skin once a week.   Vitamin D, Ergocalciferol, (DRISDOL) 1.25 MG (50000 UNIT) CAPS capsule TAKE 1 CAPSULE BY MOUTH ONE TIME PER  WEEK   [DISCONTINUED] busPIRone (BUSPAR) 15 MG tablet TAKE 1 TABLET (15 MG TOTAL) BY MOUTH 3 (THREE) TIMES DAILY FOR ANXIETY.   [DISCONTINUED] chlorthalidone (HYGROTON) 25 MG tablet TAKE 1 TABLET (25 MG TOTAL) BY MOUTH DAILY.   No facility-administered encounter medications on file as of 02/21/2023.    Surgical History: Past Surgical History:  Procedure Laterality Date   CESAREAN SECTION  1992   CHOLECYSTECTOMY  10/26/2020   COLONOSCOPY     polyps removed first procedure. 2nd  time all was clear   COLONOSCOPY WITH PROPOFOL N/A 03/14/2019   Procedure: COLONOSCOPY WITH PROPOFOL;  Surgeon: Toney Reil, MD;  Location: [facility] ENDOSCOPY;  Service: Gastroenterology;  Laterality: N/A;   FINGER SURGERY Left 2011   left finger cut off x 2.(only up to last digit)   HEMATOMA EVACUATION Left 1999   upper part of foot was injured d/t 500lb weight landing on her foot.    LAPAROSCOPIC SALPINGO OOPHERECTOMY Left 04/02/2018   Procedure: LAPAROSCOPIC SALPINGO OOPHORECTOMY;  Surgeon: Vena Austria, MD;  Location: [facility] ORS;  Service: Gynecology;  Laterality: Left;    Medical History: Past Medical History:  Diagnosis Date   Anginal pain (HCC)    tightness related to anxiety   Anxiety    Cholecystolithiasis    Depression    Diabetes mellitus without complication (HCC)    GERD (gastroesophageal reflux disease)    throws up easily but not diagnosed with reflux   History of C-section 1992   Hypertension    Sleep apnea    uses cpap    Family History: Family History  Problem Relation Age of Onset   Breast cancer Mother 40       x 3 times   Colon cancer Mother    Brain cancer Mother    Cancer - Colon Mother    Cancer - Other Mother    Heart Problems Father    Colon cancer Brother    Heart Problems Brother     Social History   Socioeconomic History   Marital status: Widowed    Spouse name: Not on file   Number of children: 1   Years of education: Not on file   Highest education level: Not on file  Occupational History   Occupation: works in Programme researcher, broadcasting/film/video  Tobacco Use   Smoking status: Never   Smokeless tobacco: Never  Vaping Use   Vaping status: Never Used  Substance and Sexual Activity   Alcohol use: No   Drug use: No   Sexual activity: Not Currently    Birth control/protection: Post-menopausal  Other Topics Concern   Not on file  Social History Narrative   Son has schizophrenia and autism but is fully capable of helping mother after surgery    Social Determinants of Health   Financial Resource Strain: Not on file  Food Insecurity: Not on file  Transportation Needs: Not on file  Physical Activity: Not on file  Stress: Not on file  Social Connections: Not on file  Intimate Partner Violence: Not on file      Review of Systems  Vital Signs: BP (!) 141/71   Pulse 84   Temp 98.2 F (36.8 C)   Resp 16   Ht 5\' 9"  (1.753 m)   Wt (!) 314 lb (142.4 kg)   SpO2 96%   BMI 46.37 kg/m    Physical Exam     Assessment/Plan: 1. Acute vestibular neuronitis of right ear Resolved, ok to return to work, no restrictions  2. Vertigo Return to work, no restrictions   General Counseling: Candace Clayton verbalizes understanding of the findings of todays visit and agrees with plan of treatment. I have discussed any further diagnostic evaluation that may be needed or ordered today. We also reviewed her medications today. she has been encouraged to call the office with any questions or concerns that should arise related to todays visit.    No orders of the defined types were placed in this encounter.   No orders of the defined types were placed in this encounter.   No follow-ups on file.   Total time spent:20 Minutes Time spent includes review of chart, medications, test results, and follow up plan with the patient.   [place] Controlled Substance Database was reviewed by me.  This patient was seen by Sallyanne Kuster, FNP-C in collaboration with Dr. Beverely Risen as a part of collaborative care agreement.   Saeed Toren R. Tedd Sias, MSN, FNP-C Internal medicine

## 2023-03-06 ENCOUNTER — Ambulatory Visit: Payer: No Typology Code available for payment source

## 2023-03-07 ENCOUNTER — Other Ambulatory Visit: Payer: Self-pay | Admitting: Nurse Practitioner

## 2023-03-07 DIAGNOSIS — F411 Generalized anxiety disorder: Secondary | ICD-10-CM

## 2023-03-07 DIAGNOSIS — Z760 Encounter for issue of repeat prescription: Secondary | ICD-10-CM

## 2023-03-07 NOTE — Telephone Encounter (Signed)
Please review

## 2023-03-19 ENCOUNTER — Encounter: Payer: Self-pay | Admitting: Nurse Practitioner

## 2023-03-29 ENCOUNTER — Telehealth: Payer: Self-pay | Admitting: Nurse Practitioner

## 2023-03-29 NOTE — Telephone Encounter (Signed)
Faxed disability documents & 12/04/22-present office notes to [employer]; [phone]

## 2023-03-31 ENCOUNTER — Other Ambulatory Visit: Payer: Self-pay | Admitting: Nurse Practitioner

## 2023-03-31 DIAGNOSIS — E559 Vitamin D deficiency, unspecified: Secondary | ICD-10-CM

## 2023-04-02 NOTE — Telephone Encounter (Signed)
Please review and send 

## 2023-04-03 ENCOUNTER — Ambulatory Visit: Payer: No Typology Code available for payment source

## 2023-04-05 ENCOUNTER — Encounter: Payer: Self-pay | Admitting: Nurse Practitioner

## 2023-04-05 ENCOUNTER — Ambulatory Visit: Payer: No Typology Code available for payment source | Admitting: Nurse Practitioner

## 2023-04-05 VITALS — BP 118/75 | HR 70 | Temp 98.6°F | Resp 16 | Ht 69.0 in | Wt 312.4 lb

## 2023-04-05 DIAGNOSIS — I10 Essential (primary) hypertension: Secondary | ICD-10-CM | POA: Diagnosis not present

## 2023-04-05 DIAGNOSIS — E538 Deficiency of other specified B group vitamins: Secondary | ICD-10-CM | POA: Diagnosis not present

## 2023-04-05 DIAGNOSIS — E1165 Type 2 diabetes mellitus with hyperglycemia: Secondary | ICD-10-CM | POA: Diagnosis not present

## 2023-04-05 DIAGNOSIS — Z23 Encounter for immunization: Secondary | ICD-10-CM | POA: Diagnosis not present

## 2023-04-05 MED ORDER — CYANOCOBALAMIN 1000 MCG/ML IJ SOLN
1000.0000 ug | Freq: Once | INTRAMUSCULAR | Status: AC
Start: 2023-04-05 — End: 2023-04-05
  Administered 2023-04-05: 1000 ug via INTRAMUSCULAR

## 2023-04-05 MED ORDER — OZEMPIC (2 MG/DOSE) 8 MG/3ML ~~LOC~~ SOPN
2.0000 mg | PEN_INJECTOR | SUBCUTANEOUS | 1 refills | Status: DC
Start: 2023-04-05 — End: 2023-09-18

## 2023-04-05 NOTE — Progress Notes (Signed)
[facility] [address] [place], [place] [address]  Internal MEDICINE  Office Visit Note  Patient Name: Candace Clayton  [id number]  [id number]  Date of Service: 04/05/2023  Chief Complaint  Patient presents with   Depression   Diabetes   Gastroesophageal Reflux   Hypertension   Follow-up    HPI Lorijo presents for a follow-up visit for diabetes, hypertension and B12 deficiency.  Diabetes -- A1c elevated in July at 8.8, currently on metformin 500 mg twice daily and ozempic 1 mg weekly  Hypertension -- BP elevated then rechecked.  B12 deficiency -- due for injection today, last B12 level checked in April this year and was 135, still feeling fatigued and very low energy, sleeping a lot during the day.  Chronic pain -- still uncontrolled, sees Dr. Cherylann Ratel for injections    Current Medication: Outpatient Encounter Medications as of 04/05/2023  Medication Sig   albuterol (VENTOLIN HFA) 108 (90 Base) MCG/ACT inhaler TAKE 2 PUFFS BY MOUTH EVERY 6 HOURS AS NEEDED FOR WHEEZE OR SHORTNESS OF BREATH   amLODipine (NORVASC) 10 MG tablet TAKE 1 TABLET BY MOUTH EVERY DAY   Budeson-Glycopyrrol-Formoterol (BREZTRI AEROSPHERE) 160-9-4.8 MCG/ACT AERO Inhale 2 puffs into the lungs 2 (two) times daily.   busPIRone (BUSPAR) 15 MG tablet TAKE 1 TABLET (15 MG TOTAL) BY MOUTH 3 (THREE) TIMES DAILY FOR ANXIETY.   chlorthalidone (HYGROTON) 25 MG tablet TAKE 1 TABLET (25 MG TOTAL) BY MOUTH DAILY.   clobetasol ointment (TEMOVATE) 0.05 % Apply 1 Application topically daily. To affected area until resolved.   desvenlafaxine (PRISTIQ) 100 MG 24 hr tablet TAKE 1 TABLET BY MOUTH EVERY DAY   fluticasone (FLONASE) 50 MCG/ACT nasal spray Place 2 sprays into both nostrils daily.   glucose blood (CONTOUR NEXT TEST) test strip 1 each by Other route daily. DX E11.65   hydrochlorothiazide (HYDRODIURIL) 25 MG tablet TAKE 1 TABLET (25 MG TOTAL) BY MOUTH DAILY.   ipratropium-albuterol (DUONEB) 0.5-2.5 (3)  MG/3ML SOLN Take 3 mLs by nebulization every 4 (four) hours as needed (shortness of breath or wheezing).   meclizine (ANTIVERT) 50 MG tablet Take 1 tablet (50 mg total) by mouth 3 (three) times daily as needed for dizziness or nausea.   metFORMIN (GLUCOPHAGE) 500 MG tablet TAKE 1 TABLET BY MOUTH TWICE A DAY WITH FOOD   methocarbamol (ROBAXIN) 750 MG tablet Take 1 tablet (750 mg total) by mouth every 8 (eight) hours as needed for muscle spasms.   metoprolol succinate (TOPROL-XL) 100 MG 24 hr tablet TAKE 1 TABLET BY MOUTH EVERY DAY WITH OR IMMEDIATELY FOLLOWING A MEAL   Microlet Lancets MISC 1 each by Does not apply route daily. DX-E11.65   minoxidil (LONITEN) 10 MG tablet TAKE 1 TABLET BY MOUTH EVERY DAY   Multiple Vitamins-Calcium (ONE-A-DAY WOMENS PO) Take 1 tablet by mouth daily.   ondansetron (ZOFRAN-ODT) 8 MG disintegrating tablet TAKE 1 TABLET EVERY 8 HOURS AS NEEDED FOR NAUSEA OR VOMITING. LET THE TABLET DISSOLVE IN MOUTH   OVER THE COUNTER MEDICATION 1 tablet daily as needed. Takes generic stool softener for constipation d/t oxycodone.    oxyCODONE (OXY IR/ROXICODONE) 5 MG immediate release tablet Take by mouth.   oxyCODONE-acetaminophen (PERCOCET/ROXICET) 5-325 MG tablet Take 1 tablet by mouth every 8 (eight) hours as needed for severe pain.   predniSONE (STERAPRED UNI-PAK 21 TAB) 10 MG (21) TBPK tablet Use as directed for 6 days   pregabalin (LYRICA) 100 MG capsule 100 mg qAM, 200 mg qhs  rosuvastatin (CRESTOR) 5 MG tablet TAKE 1 TABLET (5 MG TOTAL) BY MOUTH DAILY.   Semaglutide, 2 MG/DOSE, (OZEMPIC, 2 MG/DOSE,) 8 MG/3ML SOPN Inject 2 mg into the skin once a week.   Vitamin D, Ergocalciferol, (DRISDOL) 1.25 MG (50000 UNIT) CAPS capsule TAKE 1 CAPSULE BY MOUTH ONE TIME PER WEEK   [DISCONTINUED] Semaglutide, 1 MG/DOSE, (OZEMPIC, 1 MG/DOSE,) 4 MG/3ML SOPN Inject 1 mg into the skin once a week.   oxyCODONE-acetaminophen (PERCOCET/ROXICET) 5-325 MG tablet Take 1 tablet by mouth every 8 (eight)  hours as needed for severe pain.   oxyCODONE-acetaminophen (PERCOCET/ROXICET) 5-325 MG tablet Take 1 tablet by mouth every 8 (eight) hours as needed for severe pain.   [EXPIRED] cyanocobalamin (VITAMIN B12) injection 1,000 mcg    No facility-administered encounter medications on file as of 04/05/2023.    Surgical History: Past Surgical History:  Procedure Laterality Date   CESAREAN SECTION  1992   CHOLECYSTECTOMY  10/26/2020   COLONOSCOPY     polyps removed first procedure. 2nd time all was clear   COLONOSCOPY WITH PROPOFOL N/A 03/14/2019   Procedure: COLONOSCOPY WITH PROPOFOL;  Surgeon: Toney Reil, MD;  Location: [facility] ENDOSCOPY;  Service: Gastroenterology;  Laterality: N/A;   FINGER SURGERY Left 2011   left finger cut off x 2.(only up to last digit)   HEMATOMA EVACUATION Left 1999   upper part of foot was injured d/t 500lb weight landing on her foot.    LAPAROSCOPIC SALPINGO OOPHERECTOMY Left 04/02/2018   Procedure: LAPAROSCOPIC SALPINGO OOPHORECTOMY;  Surgeon: Vena Austria, MD;  Location: [facility] ORS;  Service: Gynecology;  Laterality: Left;    Medical History: Past Medical History:  Diagnosis Date   Anginal pain (HCC)    tightness related to anxiety   Anxiety    Cholecystolithiasis    Depression    Diabetes mellitus without complication (HCC)    GERD (gastroesophageal reflux disease)    throws up easily but not diagnosed with reflux   History of C-section 1992   Hypertension    Sleep apnea    uses cpap    Family History: Family History  Problem Relation Age of Onset   Breast cancer Mother 40       x 3 times   Colon cancer Mother    Brain cancer Mother    Cancer - Colon Mother    Cancer - Other Mother    Heart Problems Father    Colon cancer Brother    Heart Problems Brother     Social History   Socioeconomic History   Marital status: Widowed    Spouse name: Not on file   Number of children: 1   Years of education: Not on file   Highest  education level: Not on file  Occupational History   Occupation: works in Programme researcher, broadcasting/film/video  Tobacco Use   Smoking status: Never   Smokeless tobacco: Never  Vaping Use   Vaping status: Never Used  Substance and Sexual Activity   Alcohol use: No   Drug use: No   Sexual activity: Not Currently    Birth control/protection: Post-menopausal  Other Topics Concern   Not on file  Social History Narrative   Son has schizophrenia and autism but is fully capable of helping mother after surgery   Social Determinants of Health   Financial Resource Strain: Not on file  Food Insecurity: Not on file  Transportation Needs: Not on file  Physical Activity: Not on file  Stress: Not on file  Social Connections:  Not on file  Intimate Partner Violence: Not on file      Review of Systems  Constitutional:  Positive for activity change (low energy) and fatigue. Negative for chills and unexpected weight change.  HENT:  Negative for congestion, sneezing and sore throat.   Respiratory: Negative.  Negative for cough, chest tightness, shortness of breath and wheezing.   Cardiovascular: Negative.  Negative for chest pain and palpitations.  Gastrointestinal:  Negative for abdominal pain, constipation, diarrhea, nausea and vomiting.  Musculoskeletal:  Positive for arthralgias, back pain and joint swelling (right knee). Negative for gait problem and neck pain.  Skin:  Negative for rash.  Psychiatric/Behavioral:  Negative for behavioral problems (Depression), sleep disturbance and suicidal ideas. The patient is not nervous/anxious.     Vital Signs: BP 118/75 Comment: 148/68  Pulse 70   Temp 98.6 F (37 C)   Resp 16   Ht 5\' 9"  (1.753 m)   Wt (!) 312 lb 6.4 oz (141.7 kg)   SpO2 97%   BMI 46.13 kg/m    Physical Exam Vitals reviewed.  Constitutional:      General: She is not in acute distress.    Appearance: Normal appearance. She is obese. She is not ill-appearing.  HENT:     Head: Normocephalic  and atraumatic.  Eyes:     Pupils: Pupils are equal, round, and reactive to light.  Cardiovascular:     Rate and Rhythm: Normal rate and regular rhythm.  Pulmonary:     Effort: Pulmonary effort is normal. No respiratory distress.  Neurological:     Mental Status: She is alert and oriented to person, place, and time.  Psychiatric:        Mood and Affect: Mood normal.        Behavior: Behavior normal.        Assessment/Plan: 1. Type 2 diabetes mellitus with hyperglycemia, without long-term current use of insulin (HCC) Ozempic dose increased, continue weekly injection. Follow up in 2 months to recheck a1c - Semaglutide, 2 MG/DOSE, (OZEMPIC, 2 MG/DOSE,) 8 MG/3ML SOPN; Inject 2 mg into the skin once a week.  Dispense: 9 mL; Refill: 1  2. Essential hypertension Stable, Controlled with current medications, continue as prescribed.   3. B12 deficiency B12 injection administered in office today. Recheck B12 level in a few weeks  - cyanocobalamin (VITAMIN B12) injection 1,000 mcg - B12 and Folate Panel  4. Flu vaccine need Flu vaccine administered in office today  - Influenza, MDCK, trivalent, PF(Flucelvax egg-free)   General Counseling: Dyamon verbalizes understanding of the findings of todays visit and agrees with plan of treatment. I have discussed any further diagnostic evaluation that may be needed or ordered today. We also reviewed her medications today. she has been encouraged to call the office with any questions or concerns that should arise related to todays visit.    Orders Placed This Encounter  Procedures   Influenza, MDCK, trivalent, PF(Flucelvax egg-free)   B12 and Folate Panel    Meds ordered this encounter  Medications   cyanocobalamin (VITAMIN B12) injection 1,000 mcg   Semaglutide, 2 MG/DOSE, (OZEMPIC, 2 MG/DOSE,) 8 MG/3ML SOPN    Sig: Inject 2 mg into the skin once a week.    Dispense:  9 mL    Refill:  1    Fill for 3 month supply if possible.     Return in about 2 months (around 06/05/2023) for F/U, Recheck A1C, Shalah Estelle PCP.   Total time spent:30 Minutes Time spent  includes review of chart, medications, test results, and follow up plan with the patient.   [place] Controlled Substance Database was reviewed by me.  This patient was seen by Sallyanne Kuster, FNP-C in collaboration with Dr. Beverely Risen as a part of collaborative care agreement.   Arlin Sass R. Tedd Sias, MSN, FNP-C Internal medicine

## 2023-04-14 ENCOUNTER — Other Ambulatory Visit: Payer: Self-pay | Admitting: Nurse Practitioner

## 2023-04-14 DIAGNOSIS — Z760 Encounter for issue of repeat prescription: Secondary | ICD-10-CM

## 2023-04-21 LAB — B12 AND FOLATE PANEL
Folate: 7.9 ng/mL (ref >3.0)
Vitamin B-12: 379 pg/mL (ref 232–1245)

## 2023-04-26 ENCOUNTER — Encounter
Payer: No Typology Code available for payment source | Admitting: Student in an Organized Health Care Education/Training Program

## 2023-05-01 ENCOUNTER — Ambulatory Visit
Payer: No Typology Code available for payment source | Attending: Student in an Organized Health Care Education/Training Program | Admitting: Student in an Organized Health Care Education/Training Program

## 2023-05-01 ENCOUNTER — Encounter: Payer: Self-pay | Admitting: Student in an Organized Health Care Education/Training Program

## 2023-05-01 DIAGNOSIS — M5412 Radiculopathy, cervical region: Secondary | ICD-10-CM | POA: Insufficient documentation

## 2023-05-01 DIAGNOSIS — M47816 Spondylosis without myelopathy or radiculopathy, lumbar region: Secondary | ICD-10-CM | POA: Diagnosis present

## 2023-05-01 DIAGNOSIS — G894 Chronic pain syndrome: Secondary | ICD-10-CM | POA: Diagnosis present

## 2023-05-01 DIAGNOSIS — M51369 Other intervertebral disc degeneration, lumbar region without mention of lumbar back pain or lower extremity pain: Secondary | ICD-10-CM | POA: Diagnosis present

## 2023-05-01 MED ORDER — PREGABALIN 100 MG PO CAPS
ORAL_CAPSULE | ORAL | 5 refills | Status: DC
Start: 2023-05-01 — End: 2023-07-26

## 2023-05-01 MED ORDER — OXYCODONE-ACETAMINOPHEN 5-325 MG PO TABS: 1.0000 | ORAL_TABLET | Freq: Three times a day (TID) | ORAL | 0 refills | Status: DC | PRN

## 2023-05-01 MED ORDER — OXYCODONE-ACETAMINOPHEN 5-325 MG PO TABS
1.0000 | ORAL_TABLET | Freq: Three times a day (TID) | ORAL | 0 refills | Status: DC | PRN
Start: 2023-05-31 — End: 2023-07-26

## 2023-05-01 NOTE — Progress Notes (Signed)
Safety precautions to be maintained throughout the outpatient stay will include: orient to surroundings, keep bed in low position, maintain call bell within reach at all times, provide assistance with transfer out of bed and ambulation.   Patient did not bring medications with her today. She states that she had it filled yesterday and has 60 pills at home.

## 2023-05-01 NOTE — Progress Notes (Signed)
PROVIDER NOTE: Information contained herein reflects review and annotations entered in association with encounter. Interpretation of such information and data should be left to medically-trained personnel. Information provided to patient can be located elsewhere in the medical record under "Patient Instructions". Document created using STT-dictation technology, any transcriptional errors that may result from process are unintentional.    Patient: Candace Clayton  Service Category: E/M  Provider: Edward Jolly, MD  DOB: 07/08/1960  DOS: 05/01/2023  Referring Provider: Sallyanne Kuster, NP  MRN: [id number]  Specialty: Interventional Pain Management  PCP: Sallyanne Kuster, NP  Type: Established Patient  Setting: Ambulatory outpatient    Location: Office  Delivery: Face-to-face     HPI  Candace Clayton, a 62 y.o. year old female, is here today because of her No primary diagnosis found.. Ms. Apple primary complain today is Back Pain (Lower right) Last encounter: My last encounter with her was on 02/12/23 Pertinent problems: Ms. Spruiell has Major depressive disorder, recurrent episode, moderate (HCC); Morbid obesity (HCC); Low back pain with sciatica; OSA on CPAP; Lumbar degenerative disc disease; Lumbar facet arthropathy (R>L); Lumbar spondylosis; Chronic pain syndrome; and Type 2 diabetes mellitus with hyperglycemia, without long-term current use of insulin (HCC) on their pertinent problem list. Pain Assessment: Severity of Chronic pain is reported as a 3 /10. Location: Back Lower, Right/hip /thigh down the side to above knee. Onset: More than a month ago. Quality: Aching, Burning, Restless, Squeezing. Timing: Intermittent. Modifying factor(s): rest, heat, cold, medications. Vitals:  height is 5\' 9"  (1.753 m) and weight is 300 lb (136.1 kg). Her temperature is 97.7 F (36.5 C). Her blood pressure is 132/65 and her pulse is 71. Her respiration is 16 and oxygen saturation is 99%.  BMI: Estimated body  mass index is 44.3 kg/m as calculated from the following:   Height as of this encounter: 5\' 9"  (1.753 m).   Weight as of this encounter: 300 lb (136.1 kg).  Reason for encounter: both, medication management and post-procedure evaluation and assessment.   Post-procedure evaluation    Procedure:          Anesthesia, Analgesia, Anxiolysis:  Type: Thermal Lumbar Facet, Medial Branch Radiofrequency Ablation/Neurotomy           Primary Purpose: Therapeutic Region: Posterolateral Lumbosacral Spine Level: L3, L4, L5, Medial Branch Level(s). These levels will denervate the L3-4, L4-5, lumbar facet joints. Laterality: RIGHT & LEFT  Type: Local Anesthesia  Local Anesthetic: Lidocaine 1-2%     Position: Prone   Indications: 1. Lumbar facet arthropathy    2. Lumbar spondylosis   3. Chronic pain syndrome    Ms. Mcduffey has been dealing with the above chronic pain for longer than three months and has either failed to respond, was unable to tolerate, or simply did not get enough benefit from other more conservative therapies including, but not limited to: 1. Over-the-counter medications 2. Anti-inflammatory medications 3. Muscle relaxants 4. Membrane stabilizers 5. Opioids 6. Physical therapy and/or chiropractic manipulation 7. Modalities (Heat, ice, etc.) 8. Invasive techniques such as nerve blocks. Ms. Spangle has attained more than 50% relief of the pain from a series of diagnostic injections conducted in separate occasions.  Pain Score: Pre-procedure: 5 /10 Post-procedure: 5 /10     Effectiveness:  Initial hour after procedure: 100 %  Subsequent 4-6 hours post-procedure: 100 %  Analgesia past initial 6 hours: 90 % (90 % 2 weeks)  Ongoing improvement:  Analgesic:  70% Function: Ms. Hepner reports improvement in function ROM:  Ms. Portee reports improvement in ROM   Pharmacotherapy Assessment  Analgesic: Percocet 5 mg 3 times daily as needed, quantity 90/month; MME equals  22.5    Monitoring: [place] PMP: PDMP reviewed during this encounter.       Pharmacotherapy: No side-effects or adverse reactions reported. Compliance: No problems identified. Effectiveness: Clinically acceptable.  Newman Pies, RN  05/01/2023  9:50 AM  Sign when Signing Visit Safety precautions to be maintained throughout the outpatient stay will include: orient to surroundings, keep bed in low position, maintain call bell within reach at all times, provide assistance with transfer out of bed and ambulation.   Patient did not bring medications with her today. She states that she had it filled yesterday and has 60 pills at home.    No results found for: "CBDTHCR" No results found for: "D8THCCBX" No results found for: "D9THCCBX"  UDS:  Summary  Date Value Ref Range Status  01/25/2023 Note  Final    Comment:    ==================================================================== [employer] [facility] 13 (MW) ==================================================================== Test                             Result       Flag       Units  Drug Present not Declared for Prescription Verification   Oxazepam                       35           UNEXPECTED ng/mg creat   Temazepam                      54           UNEXPECTED ng/mg creat    Oxazepam and temazepam are expected metabolites of diazepam.    Oxazepam is also an expected metabolite of other benzodiazepine    drugs, including chlordiazepoxide, prazepam, clorazepate, halazepam,    and temazepam.  Oxazepam and temazepam are available as scheduled    prescription medications.  Drug Absent but Declared for Prescription Verification   Oxycodone                      Not Detected UNEXPECTED ng/mg creat ==================================================================== Test                      Result    Flag   Units      Ref Range   Creatinine              69               mg/dL       >=16 ==================================================================== Declared Medications:  The flagging and interpretation on this report are based on the  following declared medications.  Unexpected results may arise from  inaccuracies in the declared medications.   **Note: The testing scope of this panel includes these medications:   Oxycodone  Oxycodone (Percocet)   **Note: The testing scope of this panel does not include the  following reported medications:   Acetaminophen (Percocet)  Albuterol (Ventolin HFA)  Albuterol (Duoneb)  Amlodipine (Norvasc)  Budesonide (Breztri Aerosphere)  Buspirone (Buspar)  Chlorthalidone (Hygroton)  Clobetasol (Temovate)  Desvenlafaxine (Pristiq)  Docusate (Stool Softener)  Fluticasone (Flonase)  Formoterol (Breztri Aerosphere)  Glycopyrrolate (Breztri Aerosphere)  Hydrochlorothiazide (Hydrodiuril)  Ipratropium (Duoneb)  Meclizine (Antivert)  Metformin (Glucophage)  Methocarbamol (Robaxin)  Metoprolol (Toprol)  Minoxidil (Loniten)  Multivitamin  Ondansetron (Zofran)  Prednisone  Pregabalin (Lyrica)  Rosuvastatin (Crestor)  Semaglutide (Ozempic)  Supplement  Vitamin D2 (Drisdol) ==================================================================== For clinical consultation, please call [phone]. ====================================================================       ROS  Constitutional: Denies any fever or chills Gastrointestinal: No reported hemesis, hematochezia, vomiting, or acute GI distress Musculoskeletal: Low back pain Neurological: No reported episodes of acute onset apraxia, aphasia, dysarthria, agnosia, amnesia, paralysis, loss of coordination, or loss of consciousness  Medication Review  Budeson-Glycopyrrol-Formoterol, Microlet Lancets, Multiple Vitamins-Minerals, OVER THE COUNTER MEDICATION, Semaglutide (2 MG/DOSE), Vitamin D (Ergocalciferol), albuterol, amLODipine, busPIRone, chlorthalidone,  clobetasol ointment, desvenlafaxine, fluticasone, glucose blood, hydrochlorothiazide, ipratropium-albuterol, meclizine, metFORMIN, methocarbamol, metoprolol succinate, minoxidil, ondansetron, oxyCODONE, oxyCODONE-acetaminophen, predniSONE, pregabalin, and rosuvastatin  History Review  Allergy: Ms. Owensby has No Known Allergies. Drug: Ms. Haxton  reports no history of drug use. Alcohol:  reports no history of alcohol use. Tobacco:  reports that she has never smoked. She has never used smokeless tobacco. Social: Ms. Stansel  reports that she has never smoked. She has never used smokeless tobacco. She reports that she does not drink alcohol and does not use drugs. Medical:  has a past medical history of Anginal pain (HCC), Anxiety, Cholecystolithiasis, Depression, Diabetes mellitus without complication (HCC), GERD (gastroesophageal reflux disease), History of C-section (1992), Hypertension, and Sleep apnea. Surgical: Ms. Kerkman  has a past surgical history that includes Cesarean section (1992); Finger surgery (Left, 2011); Hematoma evacuation (Left, 1999); Colonoscopy; Laparoscopic salpingo oophorectomy (Left, 04/02/2018); Colonoscopy with propofol (N/A, 03/14/2019); and Cholecystectomy (10/26/2020). Family: family history includes Brain cancer in her mother; Breast cancer (age of onset: 77) in her mother; Cancer - Colon in her mother; Cancer - Other in her mother; Colon cancer in her brother and mother; Heart Problems in her brother and father.  Laboratory Chemistry Profile   Renal Lab Results  Component Value Date   BUN 14 12/04/2022   CREATININE 0.83 12/04/2022   BCR 17 12/04/2022   GFRAA 106 02/23/2020   GFRNONAA >60 02/21/2022    Hepatic Lab Results  Component Value Date   AST 12 12/04/2022   ALT 18 12/04/2022   ALBUMIN 4.3 12/04/2022   ALKPHOS 99 12/04/2022   LIPASE 26 02/21/2022    Electrolytes Lab Results  Component Value Date   NA 139 12/04/2022   K 4.8 12/04/2022   CL 102  12/04/2022   CALCIUM 9.4 12/04/2022    Bone Lab Results  Component Value Date   VD25OH 35.6 10/02/2022    Inflammation (CRP: Acute Phase) (ESR: Chronic Phase) Lab Results  Component Value Date   CRP 2 05/24/2021   ESRSEDRATE 12 05/24/2021         Note: Above Lab results reviewed.  Physical Exam  General appearance: Well nourished, well developed, and well hydrated. In no apparent acute distress Mental status: Alert, oriented x 3 (person, place, & time)       Respiratory: No evidence of acute respiratory distress Eyes: PERLA Vitals: BP 132/65   Pulse 71   Temp 97.7 F (36.5 C)   Resp 16   Ht 5\' 9"  (1.753 m)   Wt 300 lb (136.1 kg)   SpO2 99%   BMI 44.30 kg/m  BMI: Estimated body mass index is 44.3 kg/m as calculated from the following:   Height as of this encounter: 5\' 9"  (1.753 m).   Weight as of this encounter: 300 lb (136.1 kg). Ideal: Ideal body weight: 66.2 kg (145 lb 15.1 oz) Adjusted ideal body  weight: 94.2 kg (207 lb 9.1 oz)   Lumbar Spine Area Exam  Skin & Axial Inspection: No masses, redness, or swelling Alignment: Symmetrical Functional ROM: Restricted ROM       Stability: No instability detected Muscle Tone/Strength: Functionally intact. No obvious neuro-muscular anomalies detected. Sensory (Neurological): Musculoskeletal pain pattern   Provocative Tests: Hyperextension/rotation test: Positive bilaterally for facet joint pain. Lumbar quadrant test (Kemp's test): Positive bilaterally for facet joint pain  Ambulation: Unassisted Gait: Relatively normal for age and body habitus Posture: WNL  Lower Extremity Exam      Side: Right lower extremity   Side: Left lower extremity  Stability: No instability observed           Stability: No instability observed          Skin & Extremity Inspection: Skin color, temperature, and hair growth are WNL. No peripheral edema or cyanosis. No masses, redness, swelling, asymmetry, or associated skin lesions. No  contractures.   Skin & Extremity Inspection: Skin color, temperature, and hair growth are WNL. No peripheral edema or cyanosis. No masses, redness, swelling, asymmetry, or associated skin lesions. No contractures.  Functional ROM: Unrestricted ROM                   Functional ROM: Unrestricted ROM                  Muscle Tone/Strength: Functionally intact. No obvious neuro-muscular anomalies detected.   Muscle Tone/Strength: Functionally intact. No obvious neuro-muscular anomalies detected.  Sensory (Neurological): Unimpaired         Sensory (Neurological): Unimpaired        DTR: Patellar: deferred today Achilles: deferred today Plantar: deferred today   DTR: Patellar: deferred today Achilles: deferred today Plantar: deferred today  Palpation: No palpable anomalies   Palpation: No palpable anomalies       Assessment   Diagnosis Status  1. Lumbar facet arthropathy    2. Lumbar degenerative disc disease   3. Chronic pain syndrome   4. Lumbar spondylosis   5. Cervical radicular pain (right C5/6)       Controlled Controlled Controlled    Plan of Care  1. Lumbar facet arthropathy  - oxyCODONE-acetaminophen (PERCOCET/ROXICET) 5-325 MG tablet; Take 1 tablet by mouth every 8 (eight) hours as needed for severe pain (pain score 7-10).  Dispense: 90 tablet; Refill: 0 - oxyCODONE-acetaminophen (PERCOCET/ROXICET) 5-325 MG tablet; Take 1 tablet by mouth every 8 (eight) hours as needed for severe pain (pain score 7-10).  Dispense: 90 tablet; Refill: 0 - oxyCODONE-acetaminophen (PERCOCET/ROXICET) 5-325 MG tablet; Take 1 tablet by mouth every 8 (eight) hours as needed for severe pain (pain score 7-10).  Dispense: 90 tablet; Refill: 0 - pregabalin (LYRICA) 100 MG capsule; 100 mg qAM, 200 mg qhs  Dispense: 90 capsule; Refill: 5  2. Lumbar degenerative disc disease - oxyCODONE-acetaminophen (PERCOCET/ROXICET) 5-325 MG tablet; Take 1 tablet by mouth every 8 (eight) hours as needed for severe pain  (pain score 7-10).  Dispense: 90 tablet; Refill: 0 - oxyCODONE-acetaminophen (PERCOCET/ROXICET) 5-325 MG tablet; Take 1 tablet by mouth every 8 (eight) hours as needed for severe pain (pain score 7-10).  Dispense: 90 tablet; Refill: 0 - oxyCODONE-acetaminophen (PERCOCET/ROXICET) 5-325 MG tablet; Take 1 tablet by mouth every 8 (eight) hours as needed for severe pain (pain score 7-10).  Dispense: 90 tablet; Refill: 0 - pregabalin (LYRICA) 100 MG capsule; 100 mg qAM, 200 mg qhs  Dispense: 90 capsule; Refill: 5  3. Chronic pain syndrome -  oxyCODONE-acetaminophen (PERCOCET/ROXICET) 5-325 MG tablet; Take 1 tablet by mouth every 8 (eight) hours as needed for severe pain (pain score 7-10).  Dispense: 90 tablet; Refill: 0 - oxyCODONE-acetaminophen (PERCOCET/ROXICET) 5-325 MG tablet; Take 1 tablet by mouth every 8 (eight) hours as needed for severe pain (pain score 7-10).  Dispense: 90 tablet; Refill: 0 - oxyCODONE-acetaminophen (PERCOCET/ROXICET) 5-325 MG tablet; Take 1 tablet by mouth every 8 (eight) hours as needed for severe pain (pain score 7-10).  Dispense: 90 tablet; Refill: 0 - pregabalin (LYRICA) 100 MG capsule; 100 mg qAM, 200 mg qhs  Dispense: 90 capsule; Refill: 5  4. Lumbar spondylosis - pregabalin (LYRICA) 100 MG capsule; 100 mg qAM, 200 mg qhs  Dispense: 90 capsule; Refill: 5  5. Cervical radicular pain (right C5/6) - pregabalin (LYRICA) 100 MG capsule; 100 mg qAM, 200 mg qhs  Dispense: 90 capsule; Refill: 5    Orders:  No orders of the defined types were placed in this encounter.  Follow-up plan:   Return in about 3 months (around 08/01/2023) for MM, F2F.      Recent Visits Date Type Provider Dept  02/12/23 Procedure visit Edward Jolly, MD [facility]  Showing recent visits within past 90 days and meeting all other requirements Today's Visits Date Type Provider Dept  05/01/23 Office Visit Edward Jolly, MD [facility]  Showing today's visits and meeting all  other requirements Future Appointments No visits were found meeting these conditions. Showing future appointments within next 90 days and meeting all other requirements  I discussed the assessment and treatment plan with the patient. The patient was provided an opportunity to ask questions and all were answered. The patient agreed with the plan and demonstrated an understanding of the instructions.  Patient advised to call back or seek an in-person evaluation if the symptoms or condition worsens.  Duration of encounter: .  Total time on encounter, as per AMA guidelines included both the face-to-face and non-face-to-face time personally spent by the physician and/or other qualified health care professional(s) on the day of the encounter (includes time in activities that require the physician or other qualified health care professional and does not include time in activities normally performed by clinical staff). Physician's time may include the following activities when performed: Preparing to see the patient (e.g., pre-charting review of records, searching for previously ordered imaging, lab work, and nerve conduction tests) Review of prior analgesic pharmacotherapies. Reviewing PMP Interpreting ordered tests (e.g., lab work, imaging, nerve conduction tests) Performing post-procedure evaluations, including interpretation of diagnostic procedures Obtaining and/or reviewing separately obtained history Performing a medically appropriate examination and/or evaluation Counseling and educating the patient/family/caregiver Ordering medications, tests, or procedures Referring and communicating with other health care professionals (when not separately reported) Documenting clinical information in the electronic or other health record Independently interpreting results (not separately reported) and communicating results to the patient/ family/caregiver Care coordination (not separately  reported)  Note by: Edward Jolly, MD Date: 05/01/2023; Time: 10:10 AM

## 2023-05-01 NOTE — Patient Instructions (Signed)
Medication Rules  Applies to: All patients receiving prescriptions (written or electronic).  Pharmacy of record: Pharmacy where electronic prescriptions will be sent. If written prescriptions are taken to a different pharmacy, please inform the nursing staff. The pharmacy listed in the electronic medical record should be the one where you would like electronic prescriptions to be sent.  Prescription refills: Only during scheduled appointments. Applies to both, written and electronic prescriptions.  NOTE: The following applies primarily to controlled substances (Opioid* Pain Medications).   Patient's responsibilities: Pain Pills: Bring all pain pills to every appointment (except for procedure appointments). Pill Bottles: Bring pills in original pharmacy bottle. Always bring newest bottle. Bring bottle, even if empty. Medication refills: You are responsible for knowing and keeping track of what medications you need refilled. The day before your appointment, write a list of all prescriptions that need to be refilled. Bring that list to your appointment and give it to the admitting nurse. Prescriptions will be written only during appointments. If you forget a medication, it will not be "Called in", "Faxed", or "electronically sent". You will need to get another appointment to get these prescribed. Prescription Accuracy: You are responsible for carefully inspecting your prescriptions before leaving our office. Have the discharge nurse carefully go over each prescription with you, before taking them home. Make sure that your name is accurately spelled, that your address is correct. Check the name and dose of your medication to make sure it is accurate. Check the number of pills, and the written instructions to make sure they are clear and accurate. Make sure that you are given enough medication to last until your next medication refill appointment. Taking Medication: Take medication as prescribed. Never  take more pills than instructed. Never take medication more frequently than prescribed. Taking less pills or less frequently is permitted and encouraged, when it comes to controlled substances (written prescriptions).  Inform other Doctors: Always inform, all of your healthcare providers, of all the medications you take. Pain Medication from other Providers: You are not allowed to accept any additional pain medication from any other Doctor or Healthcare provider. There are two exceptions to this rule. (see below) In the event that you require additional pain medication, you are responsible for notifying us, as stated below. Medication Agreement: You are responsible for carefully reading and following our Medication Agreement. This must be signed before receiving any prescriptions from our practice. Safely store a copy of your signed Agreement. Violations to the Agreement will result in no further prescriptions. (Additional copies of our Medication Agreement are available upon request.) Laws, Rules, & Regulations: All patients are expected to follow all Federal and State Laws, Statutes, Rules, & Regulations. Ignorance of the Laws does not constitute a valid excuse. The use of any illegal substances is prohibited. Adopted CDC guidelines & recommendations: Target dosing levels will be at or below 60 MME/day. Use of benzodiazepines** is not recommended.  Exceptions: There are only two exceptions to the rule of not receiving pain medications from other Healthcare Providers. Exception #1 (Emergencies): In the event of an emergency (i.e.: accident requiring emergency care), you are allowed to receive additional pain medication. However, you are responsible for: As soon as you are able, call our office [phone], at any time of the day or night, and leave a message stating your name, the date and nature of the emergency, and the name and dose of the medication prescribed. In the event that your call is answered  by a member of   our staff, make sure to document and save the date, time, and the name of the person that took your information.  Exception #2 (Planned Surgery): In the event that you are scheduled by another doctor or dentist to have any type of surgery or procedure, you are allowed (for a period no longer than 30 days), to receive additional pain medication, for the acute post-op pain. However, in this case, you are responsible for picking up a copy of our "Post-op Pain Management for Surgeons" handout, and giving it to your surgeon or dentist. This document is available at our office, and does not require an appointment to obtain it. Simply go to our office during business hours (Monday-Thursday from 8:00 AM to 4:00 PM) (Friday 8:00 AM to 12:00 Noon) or if you have a scheduled appointment with us, prior to your surgery, and ask for it by name. In addition, you will need to provide us with your name, name of your surgeon, type of surgery, and date of procedure or surgery.  *Opioid medications include: morphine, codeine, oxycodone, oxymorphone, hydrocodone, hydromorphone, meperidine, tramadol, tapentadol, buprenorphine, fentanyl, methadone. **Benzodiazepine medications include: diazepam (Valium), alprazolam (Xanax), clonazepam (Klonopine), lorazepam (Ativan), clorazepate (Tranxene), chlordiazepoxide (Librium), estazolam (Prosom), oxazepam (Serax), temazepam (Restoril), triazolam (Halcion) (Last updated: 08/23/2017)  

## 2023-05-10 ENCOUNTER — Other Ambulatory Visit: Payer: Self-pay | Admitting: Nurse Practitioner

## 2023-05-10 DIAGNOSIS — Z760 Encounter for issue of repeat prescription: Secondary | ICD-10-CM

## 2023-05-21 ENCOUNTER — Encounter: Payer: Self-pay | Admitting: Nurse Practitioner

## 2023-05-24 ENCOUNTER — Other Ambulatory Visit: Payer: Self-pay | Admitting: Nurse Practitioner

## 2023-05-24 DIAGNOSIS — E559 Vitamin D deficiency, unspecified: Secondary | ICD-10-CM

## 2023-05-24 DIAGNOSIS — E1165 Type 2 diabetes mellitus with hyperglycemia: Secondary | ICD-10-CM

## 2023-06-05 ENCOUNTER — Ambulatory Visit: Payer: No Typology Code available for payment source | Admitting: Nurse Practitioner

## 2023-06-09 ENCOUNTER — Other Ambulatory Visit: Payer: Self-pay | Admitting: Nurse Practitioner

## 2023-06-09 DIAGNOSIS — L659 Nonscarring hair loss, unspecified: Secondary | ICD-10-CM

## 2023-06-09 DIAGNOSIS — I10 Essential (primary) hypertension: Secondary | ICD-10-CM

## 2023-06-14 ENCOUNTER — Encounter: Payer: Self-pay | Admitting: Nurse Practitioner

## 2023-06-14 ENCOUNTER — Ambulatory Visit (INDEPENDENT_AMBULATORY_CARE_PROVIDER_SITE_OTHER): Payer: No Typology Code available for payment source | Admitting: Nurse Practitioner

## 2023-06-14 VITALS — BP 141/64 | HR 68 | Temp 98.3°F | Resp 16 | Ht 69.0 in | Wt 308.4 lb

## 2023-06-14 DIAGNOSIS — J441 Chronic obstructive pulmonary disease with (acute) exacerbation: Secondary | ICD-10-CM

## 2023-06-14 DIAGNOSIS — E1165 Type 2 diabetes mellitus with hyperglycemia: Secondary | ICD-10-CM

## 2023-06-14 DIAGNOSIS — I152 Hypertension secondary to endocrine disorders: Secondary | ICD-10-CM

## 2023-06-14 DIAGNOSIS — E1169 Type 2 diabetes mellitus with other specified complication: Secondary | ICD-10-CM | POA: Diagnosis not present

## 2023-06-14 DIAGNOSIS — Z760 Encounter for issue of repeat prescription: Secondary | ICD-10-CM

## 2023-06-14 DIAGNOSIS — E559 Vitamin D deficiency, unspecified: Secondary | ICD-10-CM | POA: Diagnosis not present

## 2023-06-14 DIAGNOSIS — E538 Deficiency of other specified B group vitamins: Secondary | ICD-10-CM | POA: Diagnosis not present

## 2023-06-14 DIAGNOSIS — E1159 Type 2 diabetes mellitus with other circulatory complications: Secondary | ICD-10-CM

## 2023-06-14 DIAGNOSIS — F411 Generalized anxiety disorder: Secondary | ICD-10-CM

## 2023-06-14 LAB — POCT GLYCOSYLATED HEMOGLOBIN (HGB A1C): Hemoglobin A1C: 7.3 % — AB (ref 4.0–5.6)

## 2023-06-14 MED ORDER — IPRATROPIUM-ALBUTEROL 0.5-2.5 (3) MG/3ML IN SOLN: 3.0000 mL | RESPIRATORY_TRACT | 3 refills | Status: DC | PRN

## 2023-06-14 MED ORDER — CHLORTHALIDONE 25 MG PO TABS: 25.0000 mg | ORAL_TABLET | Freq: Every day | ORAL | 1 refills | Status: DC

## 2023-06-14 MED ORDER — VITAMIN D (ERGOCALCIFEROL) 1.25 MG (50000 UNIT) PO CAPS: 50000.0000 [IU] | ORAL_CAPSULE | ORAL | 1 refills | Status: DC

## 2023-06-14 MED ORDER — BUSPIRONE HCL 15 MG PO TABS: 15.0000 mg | ORAL_TABLET | Freq: Three times a day (TID) | ORAL | 1 refills | Status: DC

## 2023-06-14 MED ORDER — VITAMIN B-12 1000 MCG PO TABS: 1000.0000 ug | ORAL_TABLET | Freq: Every day | ORAL | 1 refills | Status: AC

## 2023-06-14 NOTE — Progress Notes (Signed)
[facility] [address] [place], [place] [address]  Internal MEDICINE  Office Visit Note  Patient Name: Candace Clayton  [id number]  [id number]  Date of Service: 06/14/2023  Chief Complaint  Patient presents with   Depression   Diabetes   Gastroesophageal Reflux   Hypertension   Follow-up    HPI Trinette presents for a follow-up visit for diabetes, fatigue, weight loss and low vitamin D Diabetes -- A1c significantly improved by 1.5 to 7.3 today. Has been doing well on the increased dose of ozempic at 2 mg weekly Initially lost 12 lbs but gained 8 lbs back for a net loss of 4 lbs.  Fatigue -- still having significant fatigue and low energy, B12 level has improved by is still low normal. Was receiving B12 injections previously.  Vitamin D deficiency -- continues to take the weekly supplement.     Current Medication: Outpatient Encounter Medications as of 06/14/2023  Medication Sig   albuterol (VENTOLIN HFA) 108 (90 Base) MCG/ACT inhaler TAKE 2 PUFFS BY MOUTH EVERY 6 HOURS AS NEEDED FOR WHEEZE OR SHORTNESS OF BREATH   amLODipine (NORVASC) 10 MG tablet TAKE 1 TABLET BY MOUTH EVERY DAY   cyanocobalamin (VITAMIN B12) 1000 MCG tablet Take 1 tablet (1,000 mcg total) by mouth daily.   desvenlafaxine (PRISTIQ) 100 MG 24 hr tablet TAKE 1 TABLET BY MOUTH EVERY DAY   fluticasone (FLONASE) 50 MCG/ACT nasal spray Place 2 sprays into both nostrils daily.   glucose blood (CONTOUR NEXT TEST) test strip 1 each by Other route daily. DX E11.65   hydrochlorothiazide (HYDRODIURIL) 25 MG tablet TAKE 1 TABLET (25 MG TOTAL) BY MOUTH DAILY.   metFORMIN (GLUCOPHAGE) 500 MG tablet TAKE 1 TABLET BY MOUTH TWICE A DAY WITH FOOD   methocarbamol (ROBAXIN) 750 MG tablet Take 1 tablet (750 mg total) by mouth every 8 (eight) hours as needed for muscle spasms.   metoprolol succinate (TOPROL-XL) 100 MG 24 hr tablet TAKE 1 TABLET BY MOUTH EVERY DAY WITH OR IMMEDIATELY FOLLOWING A MEAL   Microlet Lancets  MISC 1 each by Does not apply route daily. DX-E11.65   minoxidil (LONITEN) 10 MG tablet TAKE 1 TABLET BY MOUTH EVERY DAY   Multiple Vitamins-Calcium (ONE-A-DAY WOMENS PO) Take 1 tablet by mouth daily.   OVER THE COUNTER MEDICATION 1 tablet daily as needed. Takes generic stool softener for constipation d/t oxycodone.    oxyCODONE (OXY IR/ROXICODONE) 5 MG immediate release tablet Take by mouth.   oxyCODONE-acetaminophen (PERCOCET/ROXICET) 5-325 MG tablet Take 1 tablet by mouth every 8 (eight) hours as needed for severe pain (pain score 7-10).   [START ON 06/30/2023] oxyCODONE-acetaminophen (PERCOCET/ROXICET) 5-325 MG tablet Take 1 tablet by mouth every 8 (eight) hours as needed for severe pain (pain score 7-10).   predniSONE (STERAPRED UNI-PAK 21 TAB) 10 MG (21) TBPK tablet Use as directed for 6 days   pregabalin (LYRICA) 100 MG capsule 100 mg qAM, 200 mg qhs   rosuvastatin (CRESTOR) 5 MG tablet TAKE 1 TABLET (5 MG TOTAL) BY MOUTH DAILY.   Semaglutide, 2 MG/DOSE, (OZEMPIC, 2 MG/DOSE,) 8 MG/3ML SOPN Inject 2 mg into the skin once a week.   [DISCONTINUED] Budeson-Glycopyrrol-Formoterol (BREZTRI AEROSPHERE) 160-9-4.8 MCG/ACT AERO Inhale 2 puffs into the lungs 2 (two) times daily.   [DISCONTINUED] busPIRone (BUSPAR) 15 MG tablet TAKE 1 TABLET (15 MG TOTAL) BY MOUTH 3 (THREE) TIMES DAILY FOR ANXIETY.   [DISCONTINUED] chlorthalidone (HYGROTON) 25 MG tablet TAKE 1 TABLET (25 MG TOTAL) BY MOUTH DAILY.   [  DISCONTINUED] clobetasol ointment (TEMOVATE) 0.05 % Apply 1 Application topically daily. To affected area until resolved.   [DISCONTINUED] ipratropium-albuterol (DUONEB) 0.5-2.5 (3) MG/3ML SOLN Take 3 mLs by nebulization every 4 (four) hours as needed (shortness of breath or wheezing).   [DISCONTINUED] meclizine (ANTIVERT) 50 MG tablet Take 1 tablet (50 mg total) by mouth 3 (three) times daily as needed for dizziness or nausea.   [DISCONTINUED] ondansetron (ZOFRAN-ODT) 8 MG disintegrating tablet TAKE 1 TABLET  EVERY 8 HOURS AS NEEDED FOR NAUSEA OR VOMITING. LET THE TABLET DISSOLVE IN MOUTH   [DISCONTINUED] Vitamin D, Ergocalciferol, (DRISDOL) 1.25 MG (50000 UNIT) CAPS capsule TAKE 1 CAPSULE BY MOUTH ONE TIME PER WEEK   busPIRone (BUSPAR) 15 MG tablet Take 1 tablet (15 mg total) by mouth 3 (three) times daily. For anxiety   chlorthalidone (HYGROTON) 25 MG tablet Take 1 tablet (25 mg total) by mouth daily.   ipratropium-albuterol (DUONEB) 0.5-2.5 (3) MG/3ML SOLN Take 3 mLs by nebulization every 4 (four) hours as needed (shortness of breath or wheezing).   oxyCODONE-acetaminophen (PERCOCET/ROXICET) 5-325 MG tablet Take 1 tablet by mouth every 8 (eight) hours as needed for severe pain (pain score 7-10).   Vitamin D, Ergocalciferol, (DRISDOL) 1.25 MG (50000 UNIT) CAPS capsule Take 1 capsule (50,000 Units total) by mouth every 7 (seven) days.   No facility-administered encounter medications on file as of 06/14/2023.    Surgical History: Past Surgical History:  Procedure Laterality Date   CESAREAN SECTION  1992   CHOLECYSTECTOMY  10/26/2020   COLONOSCOPY     polyps removed first procedure. 2nd time all was clear   COLONOSCOPY WITH PROPOFOL N/A 03/14/2019   Procedure: COLONOSCOPY WITH PROPOFOL;  Surgeon: Toney Reil, MD;  Location: [facility] ENDOSCOPY;  Service: Gastroenterology;  Laterality: N/A;   FINGER SURGERY Left 2011   left finger cut off x 2.(only up to last digit)   HEMATOMA EVACUATION Left 1999   upper part of foot was injured d/t 500lb weight landing on her foot.    LAPAROSCOPIC SALPINGO OOPHERECTOMY Left 04/02/2018   Procedure: LAPAROSCOPIC SALPINGO OOPHORECTOMY;  Surgeon: Vena Austria, MD;  Location: [facility] ORS;  Service: Gynecology;  Laterality: Left;    Medical History: Past Medical History:  Diagnosis Date   Anginal pain (HCC)    tightness related to anxiety   Anxiety    Cholecystolithiasis    Depression    Diabetes mellitus without complication (HCC)    GERD  (gastroesophageal reflux disease)    throws up easily but not diagnosed with reflux   History of C-section 1992   Hypertension    Sleep apnea    uses cpap    Family History: Family History  Problem Relation Age of Onset   Breast cancer Mother 40       x 3 times   Colon cancer Mother    Brain cancer Mother    Cancer - Colon Mother    Cancer - Other Mother    Heart Problems Father    Colon cancer Brother    Heart Problems Brother     Social History   Socioeconomic History   Marital status: Widowed    Spouse name: Not on file   Number of children: 1   Years of education: Not on file   Highest education level: Not on file  Occupational History   Occupation: works in Programme researcher, broadcasting/film/video  Tobacco Use   Smoking status: Never   Smokeless tobacco: Never  Vaping Use   Vaping status: Never  Used  Substance and Sexual Activity   Alcohol use: No   Drug use: No   Sexual activity: Not Currently    Birth control/protection: Post-menopausal  Other Topics Concern   Not on file  Social History Narrative   Son has schizophrenia and autism but is fully capable of helping mother after surgery   Social Drivers of Corporate investment banker Strain: Not on file  Food Insecurity: Not on file  Transportation Needs: Not on file  Physical Activity: Not on file  Stress: Not on file  Social Connections: Not on file  Intimate Partner Violence: Not on file      Review of Systems  Constitutional:  Positive for activity change (low energy) and fatigue. Negative for chills and unexpected weight change.  HENT:  Negative for congestion, sneezing and sore throat.   Respiratory: Negative.  Negative for cough, chest tightness, shortness of breath and wheezing.   Cardiovascular: Negative.  Negative for chest pain and palpitations.  Gastrointestinal:  Negative for abdominal pain, constipation, diarrhea, nausea and vomiting.  Musculoskeletal:  Positive for arthralgias and back pain. Negative for  gait problem and neck pain.  Skin:  Negative for rash.  Psychiatric/Behavioral:  Negative for behavioral problems (Depression), sleep disturbance and suicidal ideas. The patient is not nervous/anxious.     Vital Signs: BP (!) 141/64   Pulse 68   Temp 98.3 F (36.8 C)   Resp 16   Ht 5\' 9"  (1.753 m)   Wt (!) 308 lb 6.4 oz (139.9 kg)   SpO2 95%   BMI 45.54 kg/m    Physical Exam Vitals reviewed.  Constitutional:      General: She is not in acute distress.    Appearance: Normal appearance. She is obese. She is not ill-appearing.  HENT:     Head: Normocephalic and atraumatic.  Eyes:     Pupils: Pupils are equal, round, and reactive to light.  Cardiovascular:     Rate and Rhythm: Normal rate and regular rhythm.  Pulmonary:     Effort: Pulmonary effort is normal. No respiratory distress.  Neurological:     Mental Status: She is alert and oriented to person, place, and time.  Psychiatric:        Mood and Affect: Mood normal.        Behavior: Behavior normal.        Assessment/Plan: 1. Type 2 diabetes mellitus with other specified complication, without long-term current use of insulin (HCC) (Primary) A1c is improving, continue medications as prescribed. Urine sent for microalbumin/creatinine ratio.  - POCT glycosylated hemoglobin (Hb A1C) - Urine Microalbumin w/creat. ratio  2. Hypertension associated with type 2 diabetes mellitus (HCC) Continue chlorthalidone as prescribed.  - chlorthalidone (HYGROTON) 25 MG tablet; Take 1 tablet (25 mg total) by mouth daily.  Dispense: 90 tablet; Refill: 1  3. Chronic obstructive asthma with exacerbation (HCC) Continue prn duoneb treatments for SOB and/or wheezing - ipratropium-albuterol (DUONEB) 0.5-2.5 (3) MG/3ML SOLN; Take 3 mLs by nebulization every 4 (four) hours as needed (shortness of breath or wheezing).  Dispense: 360 mL; Refill: 3  4. B12 deficiency May continue B12 supplement with oral dose instead of injections  -  cyanocobalamin (VITAMIN B12) 1000 MCG tablet; Take 1 tablet (1,000 mcg total) by mouth daily.  Dispense: 90 tablet; Refill: 1  5. Vitamin D deficiency Continue weekly supplement as prescribed.   6. GAD (generalized anxiety disorder) Continue buspirone as prescribed.  - busPIRone (BUSPAR) 15 MG tablet; Take 1 tablet (  15 mg total) by mouth 3 (three) times daily. For anxiety  Dispense: 270 tablet; Refill: 1   General Counseling: Charnell verbalizes understanding of the findings of todays visit and agrees with plan of treatment. I have discussed any further diagnostic evaluation that may be needed or ordered today. We also reviewed her medications today. she has been encouraged to call the office with any questions or concerns that should arise related to todays visit.    Orders Placed This Encounter  Procedures   Urine Microalbumin w/creat. ratio   POCT glycosylated hemoglobin (Hb A1C)    Meds ordered this encounter  Medications   cyanocobalamin (VITAMIN B12) 1000 MCG tablet    Sig: Take 1 tablet (1,000 mcg total) by mouth daily.    Dispense:  90 tablet    Refill:  1    Please see if insurance will cover prescription, if not please show patient where to find in store over the counter.   busPIRone (BUSPAR) 15 MG tablet    Sig: Take 1 tablet (15 mg total) by mouth 3 (three) times daily. For anxiety    Dispense:  270 tablet    Refill:  1   chlorthalidone (HYGROTON) 25 MG tablet    Sig: Take 1 tablet (25 mg total) by mouth daily.    Dispense:  90 tablet    Refill:  1   ipratropium-albuterol (DUONEB) 0.5-2.5 (3) MG/3ML SOLN    Sig: Take 3 mLs by nebulization every 4 (four) hours as needed (shortness of breath or wheezing).    Dispense:  360 mL    Refill:  3   Vitamin D, Ergocalciferol, (DRISDOL) 1.25 MG (50000 UNIT) CAPS capsule    Sig: Take 1 capsule (50,000 Units total) by mouth every 7 (seven) days.    Dispense:  4 capsule    Refill:  1    Return in about 4 weeks (around  07/12/2023) for F/U, Francetta Ilg PCP oral B12 supplement, depression and possible lab orders. .   Total time spent:30 Minutes Time spent includes review of chart, medications, test results, and follow up plan with the patient.   [place] Controlled Substance Database was reviewed by me.  This patient was seen by Sallyanne Kuster, FNP-C in collaboration with Dr. Beverely Risen as a part of collaborative care agreement.   Ireland Chagnon R. Tedd Sias, MSN, FNP-C Internal medicine

## 2023-06-15 LAB — MICROALBUMIN / CREATININE URINE RATIO
Creatinine, Urine: 78.7 mg/dL
Microalb/Creat Ratio: 12 mg/g{creat} (ref 0–29)
Microalbumin, Urine: 9.1 ug/mL

## 2023-07-05 ENCOUNTER — Other Ambulatory Visit: Payer: Self-pay

## 2023-07-05 DIAGNOSIS — E559 Vitamin D deficiency, unspecified: Secondary | ICD-10-CM

## 2023-07-05 MED ORDER — VITAMIN D (ERGOCALCIFEROL) 1.25 MG (50000 UNIT) PO CAPS: 50000.0000 [IU] | ORAL_CAPSULE | ORAL | 1 refills | Status: DC

## 2023-07-12 ENCOUNTER — Ambulatory Visit: Payer: No Typology Code available for payment source | Admitting: Nurse Practitioner

## 2023-07-12 ENCOUNTER — Encounter: Payer: Self-pay | Admitting: Nurse Practitioner

## 2023-07-12 VITALS — BP 150/80 | HR 80 | Temp 98.5°F | Resp 16 | Ht 69.0 in | Wt 311.6 lb

## 2023-07-12 DIAGNOSIS — E538 Deficiency of other specified B group vitamins: Secondary | ICD-10-CM

## 2023-07-12 DIAGNOSIS — J0110 Acute frontal sinusitis, unspecified: Secondary | ICD-10-CM

## 2023-07-12 DIAGNOSIS — I10 Essential (primary) hypertension: Secondary | ICD-10-CM

## 2023-07-12 MED ORDER — PREDNISONE 10 MG (21) PO TBPK: ORAL_TABLET | ORAL | 0 refills | Status: DC

## 2023-07-12 MED ORDER — AZITHROMYCIN 250 MG PO TABS: ORAL_TABLET | ORAL | 0 refills | Status: AC

## 2023-07-12 NOTE — Progress Notes (Signed)
[facility] [address] [place], [place] [address]  Internal MEDICINE  Office Visit Note  Patient Name: Candace Clayton  [id number]  [id number]  Date of Service: 07/12/2023  Chief Complaint  Patient presents with   Depression   Diabetes   Gastroesophageal Reflux   Hypertension   Follow-up    Sore throat, congestion, coughing     [place] presents for a follow-up visit for URI, low B12 and elevated BP Sinusitis --  symptoms started 6 days ago, reports body aches, cough, nasal congestion, sinus pressure, runny nose, sneezing, sore throat, fatigue, B12 deficiency -- to start oral supplement Hypertension -- BP is elevated now due to the patient taking cough medication.    Current Medication: Outpatient Encounter Medications as of 07/12/2023  Medication Sig   albuterol (VENTOLIN HFA) 108 (90 Base) MCG/ACT inhaler TAKE 2 PUFFS BY MOUTH EVERY 6 HOURS AS NEEDED FOR WHEEZE OR SHORTNESS OF BREATH   amLODipine (NORVASC) 10 MG tablet TAKE 1 TABLET BY MOUTH EVERY DAY   azithromycin (ZITHROMAX) 250 MG tablet Take 2 tablets on day 1, then 1 tablet daily on days 2 through 5   busPIRone (BUSPAR) 15 MG tablet Take 1 tablet (15 mg total) by mouth 3 (three) times daily. For anxiety   chlorthalidone (HYGROTON) 25 MG tablet Take 1 tablet (25 mg total) by mouth daily.   cyanocobalamin (VITAMIN B12) 1000 MCG tablet Take 1 tablet (1,000 mcg total) by mouth daily.   desvenlafaxine (PRISTIQ) 100 MG 24 hr tablet TAKE 1 TABLET BY MOUTH EVERY DAY   fluticasone (FLONASE) 50 MCG/ACT nasal spray Place 2 sprays into both nostrils daily.   glucose blood (CONTOUR NEXT TEST) test strip 1 each by Other route daily. DX E11.65   hydrochlorothiazide (HYDRODIURIL) 25 MG tablet TAKE 1 TABLET (25 MG TOTAL) BY MOUTH DAILY.   ipratropium-albuterol (DUONEB) 0.5-2.5 (3) MG/3ML SOLN Take 3 mLs by nebulization every 4 (four) hours as needed (shortness of breath or wheezing).   metFORMIN (GLUCOPHAGE) 500 MG tablet  TAKE 1 TABLET BY MOUTH TWICE A DAY WITH FOOD   methocarbamol (ROBAXIN) 750 MG tablet Take 1 tablet (750 mg total) by mouth every 8 (eight) hours as needed for muscle spasms.   metoprolol succinate (TOPROL-XL) 100 MG 24 hr tablet TAKE 1 TABLET BY MOUTH EVERY DAY WITH OR IMMEDIATELY FOLLOWING A MEAL   Microlet Lancets MISC 1 each by Does not apply route daily. DX-E11.65   minoxidil (LONITEN) 10 MG tablet TAKE 1 TABLET BY MOUTH EVERY DAY   Multiple Vitamins-Calcium (ONE-A-DAY WOMENS PO) Take 1 tablet by mouth daily.   OVER THE COUNTER MEDICATION 1 tablet daily as needed. Takes generic stool softener for constipation d/t oxycodone.    oxyCODONE (OXY IR/ROXICODONE) 5 MG immediate release tablet Take by mouth.   oxyCODONE-acetaminophen (PERCOCET/ROXICET) 5-325 MG tablet Take 1 tablet by mouth every 8 (eight) hours as needed for severe pain (pain score 7-10).   predniSONE (STERAPRED UNI-PAK 21 TAB) 10 MG (21) TBPK tablet Use as directed for 6 days   pregabalin (LYRICA) 100 MG capsule 100 mg qAM, 200 mg qhs   rosuvastatin (CRESTOR) 5 MG tablet TAKE 1 TABLET (5 MG TOTAL) BY MOUTH DAILY.   Semaglutide, 2 MG/DOSE, (OZEMPIC, 2 MG/DOSE,) 8 MG/3ML SOPN Inject 2 mg into the skin once a week.   Vitamin D, Ergocalciferol, (DRISDOL) 1.25 MG (50000 UNIT) CAPS capsule Take 1 capsule (50,000 Units total) by mouth every 7 (seven) days.   [DISCONTINUED] predniSONE (STERAPRED UNI-PAK 21  TAB) 10 MG (21) TBPK tablet Use as directed for 6 days   oxyCODONE-acetaminophen (PERCOCET/ROXICET) 5-325 MG tablet Take 1 tablet by mouth every 8 (eight) hours as needed for severe pain (pain score 7-10).   oxyCODONE-acetaminophen (PERCOCET/ROXICET) 5-325 MG tablet Take 1 tablet by mouth every 8 (eight) hours as needed for severe pain (pain score 7-10).   No facility-administered encounter medications on file as of 07/12/2023.    Surgical History: Past Surgical History:  Procedure Laterality Date   CESAREAN SECTION  1992    CHOLECYSTECTOMY  10/26/2020   COLONOSCOPY     polyps removed first procedure. 2nd time all was clear   COLONOSCOPY WITH PROPOFOL N/A 03/14/2019   Procedure: COLONOSCOPY WITH PROPOFOL;  Surgeon: Toney Reil, MD;  Location: [facility] ENDOSCOPY;  Service: Gastroenterology;  Laterality: N/A;   FINGER SURGERY Left 2011   left finger cut off x 2.(only up to last digit)   HEMATOMA EVACUATION Left 1999   upper part of foot was injured d/t 500lb weight landing on her foot.    LAPAROSCOPIC SALPINGO OOPHERECTOMY Left 04/02/2018   Procedure: LAPAROSCOPIC SALPINGO OOPHORECTOMY;  Surgeon: Vena Austria, MD;  Location: [facility] ORS;  Service: Gynecology;  Laterality: Left;    Medical History: Past Medical History:  Diagnosis Date   Anginal pain (HCC)    tightness related to anxiety   Anxiety    Cholecystolithiasis    Depression    Diabetes mellitus without complication (HCC)    GERD (gastroesophageal reflux disease)    throws up easily but not diagnosed with reflux   History of C-section 1992   Hypertension    Sleep apnea    uses cpap    Family History: Family History  Problem Relation Age of Onset   Breast cancer Mother 40       x 3 times   Colon cancer Mother    Brain cancer Mother    Cancer - Colon Mother    Cancer - Other Mother    Heart Problems Father    Colon cancer Brother    Heart Problems Brother     Social History   Socioeconomic History   Marital status: Widowed    Spouse name: Not on file   Number of children: 1   Years of education: Not on file   Highest education level: Not on file  Occupational History   Occupation: works in Programme researcher, broadcasting/film/video  Tobacco Use   Smoking status: Never   Smokeless tobacco: Never  Vaping Use   Vaping status: Never Used  Substance and Sexual Activity   Alcohol use: No   Drug use: No   Sexual activity: Not Currently    Birth control/protection: Post-menopausal  Other Topics Concern   Not on file  Social History Narrative    Son has schizophrenia and autism but is fully capable of helping mother after surgery   Social Drivers of Corporate investment banker Strain: Not on file  Food Insecurity: Not on file  Transportation Needs: Not on file  Physical Activity: Not on file  Stress: Not on file  Social Connections: Not on file  Intimate Partner Violence: Not on file      Review of Systems  Constitutional:  Positive for appetite change and fatigue. Negative for chills and fever.  HENT:  Positive for congestion, ear pain, postnasal drip, rhinorrhea, sinus pressure, sinus pain, sneezing, sore throat, trouble swallowing and voice change.   Respiratory:  Positive for cough. Negative for chest tightness, shortness of  breath and wheezing.   Cardiovascular:  Negative for chest pain and palpitations.  Musculoskeletal:  Positive for myalgias (body aches).  Neurological:  Negative for headaches.  Psychiatric/Behavioral:  Positive for depression.     Vital Signs: BP (!) 150/80 Comment: 158/83  Pulse 80   Temp 98.5 F (36.9 C)   Resp 16   Ht 5\' 9"  (1.753 m)   Wt (!) 311 lb 9.6 oz (141.3 kg)   SpO2 96%   BMI 46.02 kg/m    Physical Exam Vitals reviewed.  Constitutional:      General: She is not in acute distress.    Appearance: Normal appearance. She is obese. She is ill-appearing.  HENT:     Head: Normocephalic and atraumatic.     Right Ear: Tympanic membrane and external ear normal. Swelling and tenderness present.     Left Ear: Tympanic membrane, ear canal and external ear normal.     Nose: Mucosal edema, congestion and rhinorrhea present.     Right Turbinates: Swollen and pale.     Left Turbinates: Swollen and pale.     Right Sinus: Maxillary sinus tenderness and frontal sinus tenderness present.     Left Sinus: Maxillary sinus tenderness and frontal sinus tenderness present.     Mouth/Throat:     Mouth: Mucous membranes are moist.     Pharynx: Pharyngeal swelling, posterior oropharyngeal erythema  and postnasal drip present.  Eyes:     Pupils: Pupils are equal, round, and reactive to light.  Cardiovascular:     Rate and Rhythm: Normal rate and regular rhythm.  Pulmonary:     Effort: Pulmonary effort is normal. No respiratory distress.     Breath sounds: Normal breath sounds. No wheezing or rales.  Neurological:     Mental Status: She is alert and oriented to person, place, and time.  Psychiatric:        Mood and Affect: Mood normal.        Behavior: Behavior normal.        Assessment/Plan: 1. Acute non-recurrent frontal sinusitis (Primary) Zpak and prednisone taper prescribed, take until gone  - azithromycin (ZITHROMAX) 250 MG tablet; Take 2 tablets on day 1, then 1 tablet daily on days 2 through 5  Dispense: 6 tablet; Refill: 0 - predniSONE (STERAPRED UNI-PAK 21 TAB) 10 MG (21) TBPK tablet; Use as directed for 6 days  Dispense: 21 tablet; Refill: 0  2. B12 deficiency Will start OTC supplement.   3. Elevated blood pressure reading in office with diagnosis of hypertension Patient has been taking cough medication due to not feeling well recently, BP will improve once she stops taking the cough medication.    General Counseling: Darlina verbalizes understanding of the findings of todays visit and agrees with plan of treatment. I have discussed any further diagnostic evaluation that may be needed or ordered today. We also reviewed her medications today. she has been encouraged to call the office with any questions or concerns that should arise related to todays visit.    No orders of the defined types were placed in this encounter.   Meds ordered this encounter  Medications   azithromycin (ZITHROMAX) 250 MG tablet    Sig: Take 2 tablets on day 1, then 1 tablet daily on days 2 through 5    Dispense:  6 tablet    Refill:  0   predniSONE (STERAPRED UNI-PAK 21 TAB) 10 MG (21) TBPK tablet    Sig: Use as directed for 6 days  Dispense:  21 tablet    Refill:  0    Return  in about 3 months (around 10/10/2023) for F/U, Recheck A1C, Alexya Mcdaris PCP.   Total time spent:30 Minutes Time spent includes review of chart, medications, test results, and follow up plan with the patient.   [place] Controlled Substance Database was reviewed by me.  This patient was seen by Sallyanne Kuster, FNP-C in collaboration with Dr. Beverely Risen as a part of collaborative care agreement.   Chella Chapdelaine R. Tedd Sias, MSN, FNP-C Internal medicine

## 2023-07-26 ENCOUNTER — Ambulatory Visit
Payer: No Typology Code available for payment source | Attending: Student in an Organized Health Care Education/Training Program | Admitting: Student in an Organized Health Care Education/Training Program

## 2023-07-26 ENCOUNTER — Encounter: Payer: Self-pay | Admitting: Student in an Organized Health Care Education/Training Program

## 2023-07-26 DIAGNOSIS — G894 Chronic pain syndrome: Secondary | ICD-10-CM | POA: Diagnosis present

## 2023-07-26 DIAGNOSIS — M5412 Radiculopathy, cervical region: Secondary | ICD-10-CM | POA: Diagnosis present

## 2023-07-26 DIAGNOSIS — M51369 Other intervertebral disc degeneration, lumbar region without mention of lumbar back pain or lower extremity pain: Secondary | ICD-10-CM | POA: Diagnosis present

## 2023-07-26 DIAGNOSIS — M47816 Spondylosis without myelopathy or radiculopathy, lumbar region: Secondary | ICD-10-CM | POA: Diagnosis present

## 2023-07-26 MED ORDER — PREGABALIN 100 MG PO CAPS: ORAL_CAPSULE | ORAL | 5 refills | Status: DC

## 2023-07-26 MED ORDER — OXYCODONE-ACETAMINOPHEN 5-325 MG PO TABS: 1.0000 | ORAL_TABLET | Freq: Three times a day (TID) | ORAL | 0 refills | Status: DC | PRN

## 2023-07-26 NOTE — Patient Instructions (Signed)
______________________________________________________________________    General Risks and Possible Complications  Patient Responsibilities: It is important that you read this as it is part of your informed consent. It is our duty to inform you of the risks and possible complications associated with treatments offered to you. It is your responsibility as a patient to read this and to ask questions about anything that is not clear or that you believe was not covered in this document.  Patient's Rights: You have the right to refuse treatment. You also have the right to change your mind, even after initially having agreed to have the treatment done. However, under this last option, if you wait until the last second to change your mind, you may be charged for the materials used up to that point.  Introduction: Medicine is not an Visual merchandiser. Everything in Medicine, including the lack of treatment(s), carries the potential for danger, harm, or loss (which is by definition: Risk). In Medicine, a complication is a secondary problem, condition, or disease that can aggravate an already existing one. All treatments carry the risk of possible complications. The fact that a side effects or complications occurs, does not imply that the treatment was conducted incorrectly. It must be clearly understood that these can happen even when everything is done following the highest safety standards.  No treatment: You can choose not to proceed with the proposed treatment alternative. The "PRO(s)" would include: avoiding the risk of complications associated with the therapy. The "CON(s)" would include: not getting any of the treatment benefits. These benefits fall under one of three categories: diagnostic; therapeutic; and/or palliative. Diagnostic benefits include: getting information which can ultimately lead to improvement of the disease or symptom(s). Therapeutic benefits are those associated with the successful  treatment of the disease. Finally, palliative benefits are those related to the decrease of the primary symptoms, without necessarily curing the condition (example: decreasing the pain from a flare-up of a chronic condition, such as incurable terminal cancer).  General Risks and Complications: These are associated to most interventional treatments. They can occur alone, or in combination. They fall under one of the following six (6) categories: no benefit or worsening of symptoms; bleeding; infection; nerve damage; allergic reactions; and/or death. No benefits or worsening of symptoms: In Medicine there are no guarantees, only probabilities. No healthcare provider can ever guarantee that a medical treatment will work, they can only state the probability that it may. Furthermore, there is always the possibility that the condition may worsen, either directly, or indirectly, as a consequence of the treatment. Bleeding: This is more common if the patient is taking a blood thinner, either prescription or over the counter (example: Goody Powders, Fish oil, Aspirin, Garlic, etc.), or if suffering a condition associated with impaired coagulation (example: Hemophilia, cirrhosis of the liver, low platelet counts, etc.). However, even if you do not have one on these, it can still happen. If you have any of these conditions, or take one of these drugs, make sure to notify your treating physician. Infection: This is more common in patients with a compromised immune system, either due to disease (example: diabetes, cancer, human immunodeficiency virus [HIV], etc.), or due to medications or treatments (example: therapies used to treat cancer and rheumatological diseases). However, even if you do not have one on these, it can still happen. If you have any of these conditions, or take one of these drugs, make sure to notify your treating physician. Nerve Damage: This is more common when the treatment is  an invasive one, but it  can also happen with the use of medications, such as those used in the treatment of cancer. The damage can occur to small secondary nerves, or to large primary ones, such as those in the spinal cord and brain. This damage may be temporary or permanent and it may lead to impairments that can range from temporary numbness to permanent paralysis and/or brain death. Allergic Reactions: Any time a substance or material comes in contact with our body, there is the possibility of an allergic reaction. These can range from a mild skin rash (contact dermatitis) to a severe systemic reaction (anaphylactic reaction), which can result in death. Death: In general, any medical intervention can result in death, most of the time due to an unforeseen complication. ______________________________________________________________________      ______________________________________________________________________    Preparing for your procedure  Appointments: If you think you may not be able to keep your appointment, call 24-48 hours in advance to cancel. We need time to make it available to others.  Procedure visits are for procedures only. During your procedure appointment there will be: NO Prescription Refills*. NO medication changes or discussions*. NO discussion of disability issues*. NO unrelated pain problem evaluations*. NO evaluations to order other pain procedures*. *These will be addressed at a separate and distinct evaluation encounter on the provider's evaluation schedule and not during procedure days.  Instructions: Food intake: Avoid eating anything solid for at least 8 hours prior to your procedure. Clear liquid intake: You may take clear liquids such as water up to 2 hours prior to your procedure. (No carbonated drinks. No soda.) Transportation: Unless otherwise stated by your physician, bring a driver. (Driver cannot be a Market researcher, Pharmacist, community, or any other form of public transportation.) Morning  Medicines: Except for blood thinners, take all of your other morning medications with a sip of water. Make sure to take your heart and blood pressure medicines. If your blood pressure's lower number is above 100, the case will be rescheduled. Blood thinners: Make sure to stop your blood thinners as instructed.  If you take a blood thinner, but were not instructed to stop it, call our office [phone] and ask to talk to a nurse. Not stopping a blood thinner prior to certain procedures could lead to serious complications. Diabetics on insulin: Notify the staff so that you can be scheduled 1st case in the morning. If your diabetes requires high dose insulin, take only  of your normal insulin dose the morning of the procedure and notify the staff that you have done so. Preventing infections: Shower with an antibacterial soap the morning of your procedure.  Build-up your immune system: Take 1000 mg of Vitamin C with every meal (3 times a day) the day prior to your procedure. Antibiotics: Inform the nursing staff if you are taking any antibiotics or if you have any conditions that may require antibiotics prior to procedures. (Example: recent joint implants)   Pregnancy: If you are pregnant make sure to notify the nursing staff. Not doing so may result in injury to the fetus, including death.  Sickness: If you have a cold, fever, or any active infections, call and cancel or reschedule your procedure. Receiving steroids while having an infection may result in complications. Arrival: You must be in the facility at least 30 minutes prior to your scheduled procedure. Tardiness: Your scheduled time is also the cutoff time. If you do not arrive at least 15 minutes prior to your procedure, you will  be rescheduled.  Children: Do not bring any children with you. Make arrangements to keep them home. Dress appropriately: There is always a possibility that your clothing may get soiled. Avoid long dresses. Valuables:  Do not bring any jewelry or valuables.  Reasons to call and reschedule or cancel your procedure: (Following these recommendations will minimize the risk of a serious complication.) Surgeries: Avoid having procedures within 2 weeks of any surgery. (Avoid for 2 weeks before or after any surgery). Flu Shots: Avoid having procedures within 2 weeks of a flu shots or . (Avoid for 2 weeks before or after immunizations). Barium: Avoid having a procedure within 7-10 days after having had a radiological study involving the use of radiological contrast. (Myelograms, Barium swallow or enema study). Heart attacks: Avoid any elective procedures or surgeries for the initial 6 months after a "Myocardial Infarction" (Heart Attack). Blood thinners: It is imperative that you stop these medications before procedures. Let us know if you if you take any blood thinner.  Infection: Avoid procedures during or within two weeks of an infection (including chest colds or gastrointestinal problems). Symptoms associated with infections include: Localized redness, fever, chills, night sweats or profuse sweating, burning sensation when voiding, cough, congestion, stuffiness, runny nose, sore throat, diarrhea, nausea, vomiting, cold or Flu symptoms, recent or current infections. It is specially important if the infection is over the area that we intend to treat. Heart and lung problems: Symptoms that may suggest an active cardiopulmonary problem include: cough, chest pain, breathing difficulties or shortness of breath, dizziness, ankle swelling, uncontrolled high or unusually low blood pressure, and/or palpitations. If you are experiencing any of these symptoms, cancel your procedure and contact your primary care physician for an evaluation.  Remember:  Regular Business hours are:  Monday to Thursday 8:00 AM to 4:00 PM  Provider's Schedule: Delano Metz, MD:  Procedure days: Tuesday and Thursday 7:30 AM to 4:00 PM  Edward Jolly, MD:  Procedure days: Monday and Wednesday 7:30 AM to 4:00 PM Last  Updated: 06/05/2023 ______________________________________________________________________     Radiofrequency Ablation Radiofrequency ablation is a procedure that is performed to relieve pain. The procedure is often used for back, neck, or arm pain. Radiofrequency ablation involves the use of a machine that creates radio waves to make heat. During the procedure, the heat is applied to the nerve that carries the pain signal. The heat damages the nerve and interferes with the pain signal. Pain relief usually starts about 2 weeks after the procedure and lasts for 6 months to 1 year. Tell a health care provider about: Any allergies you have. All medicines you are taking, including vitamins, herbs, eye drops, creams, and over-the-counter medicines. Any problems you or family members have had with anesthetic medicines. Any bleeding problems you have. Any surgeries you have had. Any medical conditions you have. Whether you are pregnant or may be pregnant. What are the risks? Generally, this is a safe procedure. However, problems may occur, including: Pain or soreness at the injection site. Allergic reaction to medicines given during the procedure. Bleeding. Infection at the injection site. Damage to nerves or blood vessels. What happens before the procedure? When to stop eating and drinking Follow instructions from your health care provider about what you may eat and drink before your procedure. These may include: 8 hours before the procedure Stop eating most foods. Do not eat meat, fried foods, or fatty foods. Eat only light foods, such as toast or crackers. All liquids are okay except  energy drinks and alcohol. 6 hours before the procedure Stop eating. Drink only clear liquids, such as water, clear fruit juice, black coffee, plain tea, and sports drinks. Do not drink energy drinks or alcohol. 2 hours before the  procedure Stop drinking all liquids. You may be allowed to take medicine with small sips of water. If you do not follow your health care provider's instructions, your procedure may be delayed or canceled. Medicines Ask your health care provider about: Changing or stopping your regular medicines. This is especially important if you are taking diabetes medicines or blood thinners. Taking medicines such as aspirin and ibuprofen. These medicines can thin your blood. Do not take these medicines unless your health care provider tells you to take them. Taking over-the-counter medicines, vitamins, herbs, and supplements. General instructions Ask your health care provider what steps will be taken to help prevent infection. These steps may include: Removing hair at the procedure site. Washing skin with a germ-killing soap. Taking antibiotic medicine. If you will be going home right after the procedure, plan to have a responsible adult: Take you home from the hospital or clinic. You will not be allowed to drive. Care for you for the time you are told. What happens during the procedure?  You will be awake during the procedure. You will need to be able to talk with the health care provider during the procedure. An IV will be inserted into one of your veins. You will be given one or more of the following: A medicine to help you relax (sedative). A medicine to numb the area (local anesthetic). Your health care provider will insert a radiofrequency needle into the area to be treated. This is done with the help of fluoroscopy. A wire that carries the radio waves (electrode) will be put through the radiofrequency needle. An electrical pulse will be sent through the electrode to verify the correct nerve that is causing your pain. You will feel a tingling sensation, and you may have muscle twitching. The tissue around the needle tip will be heated by an electric current that comes from the radiofrequency  machine. This will numb the nerves. The needle will be removed. A bandage (dressing) will be put on the insertion area. The procedure may vary among health care providers and hospitals. What happens after the procedure? Your blood pressure, heart rate, breathing rate, and blood oxygen level will be monitored until you leave the hospital or clinic. Return to your normal activities as told by your health care provider. Ask your health care provider what activities are safe for you. If you were given a sedative during the procedure, it can affect you for several hours. Do not drive or operate machinery until your health care provider says that it is safe. Summary Radiofrequency ablation is a procedure that is performed to relieve pain. The procedure is often used for back, neck, or arm pain. Radiofrequency ablation involves the use of a machine that creates radio waves to make heat. Plan to have a responsible adult take you home from the hospital or clinic. Do not drive or operate machinery until your health care provider says that it is safe. Return to your normal activities as told by your health care provider. Ask your health care provider what activities are safe for you. This information is not intended to replace advice given to you by your health care provider. Make sure you discuss any questions you have with your health care provider. Document Revised: 11/30/2020 Document Reviewed: 11/30/2020  [employer]vier Patient Education  2024 [employer].

## 2023-07-26 NOTE — Progress Notes (Signed)
PROVIDER NOTE: Information contained herein reflects review and annotations entered in association with encounter. Interpretation of such information and data should be left to medically-trained personnel. Information provided to patient can be located elsewhere in the medical record under "Patient Instructions". Document created using STT-dictation technology, any transcriptional errors that may result from process are unintentional.    Patient: Candace Clayton  Service Category: E/M  Provider: Edward Jolly, MD  DOB: 06-23-1961  DOS: 07/26/2023  Referring Provider: Sallyanne Kuster, NP  MRN: [id number]  Specialty: Interventional Pain Management  PCP: Sallyanne Kuster, NP  Type: Established Patient  Setting: Ambulatory outpatient    Location: Office  Delivery: Face-to-face     HPI  [name], a 62 y.o. year old female, is here today because of her No primary diagnosis found.. Candace Clayton primary complain today is Back Pain  Pertinent problems: Candace Clayton has Major depressive disorder, recurrent episode, moderate (HCC); Morbid obesity (HCC); Low back pain with sciatica; OSA on CPAP; Lumbar degenerative disc disease; Lumbar facet arthropathy (R>L); Lumbar spondylosis; Chronic pain syndrome; and Type 2 diabetes mellitus with hyperglycemia, without long-term current use of insulin (HCC) on their pertinent problem list. Pain Assessment: Severity of Chronic pain is reported as a 5 /10. Location: Back Lower/to right thigh. Onset: More than a month ago. Quality: Burning, Throbbing. Timing: Constant. Modifying factor(s): meds. Vitals:  height is 5\' 9"  (1.753 m) and weight is 310 lb (140.6 kg) (abnormal). Her temperature is 97.2 F (36.2 C) (abnormal). Her blood pressure is 135/67 and her pulse is 76. Her respiration is 16 and oxygen saturation is 100%.  BMI: Estimated body mass index is 45.78 kg/m as calculated from the following:   Height as of this encounter: 5\' 9"  (1.753 m).   Weight as of  this encounter: 310 lb (140.6 kg). Last encounter: 05/01/2023. Last procedure: 02/12/2023.  Reason for encounter: follow-up evaluation.  Also for medication management.   Discussed the use of AI scribe software for clinical note transcription with the patient, who gave verbal consent to proceed.  History of Present Illness   The patient presents with worsening back pain and requests a nerve block.  She experiences severe back pain primarily on the right side, radiating down the thigh to the knee, with a burning sensation on the outside of the leg. The pain is somewhat alleviated by resting and getting off her feet, but it persists despite these measures.  She has undergone bilateral L3, L4, L5 lumbar radiofrequency ablation which was previously performed 02/12/2023.  This provided her with 80% pain relief for approximately 6 months.  Given return of axial low back pain she would like to repeat.  She manages her pain with medication, taking four doses a day during workdays at 6 AM, 9 AM, 11 AM, and 2 PM. She refrains from taking the medication on weekends to conserve it for workdays, resulting in increased pain during those times.  She is concerned about potential changes in her work schedule, as her employer plans to shift to 12-hour shifts, which she feels may be unbearable due to her pain. She currently works 8-hour shifts, which she finds more manageable. She is considering retirement due to these changes but is worried about becoming immobile if she stops working.       Pharmacotherapy Assessment  Analgesic: Percocet 5 mg 3-4 times daily as needed, quantity 90/month; MME equals 22.5    Monitoring: [place] PMP: PDMP reviewed during this encounter.  Pharmacotherapy: No side-effects or adverse reactions reported. Compliance: No problems identified. Effectiveness: Clinically acceptable.  Candace Mattes, RN  07/26/2023  8:22 AM  Sign when Signing Visit Nursing Pain Medication Assessment:   Safety precautions to be maintained throughout the outpatient stay will include: orient to surroundings, keep bed in low position, maintain call bell within reach at all times, provide assistance with transfer out of bed and ambulation.  Medication Inspection Compliance: Pill count conducted under aseptic conditions, in front of the patient. Neither the pills nor the bottle was removed from the patient's sight at any time. Once count was completed pills were immediately returned to the patient in their original bottle.  Medication: Oxycodone/APAP Pill/Patch Count:  18 of 90 pills remain Pill/Patch Appearance: Markings consistent with prescribed medication Bottle Appearance: Standard pharmacy container. Clearly labeled. Filled Date: 01 / 02 / 2025 Last Medication intake:  Today    No results found for: "CBDTHCR" No results found for: "D8THCCBX" No results found for: "D9THCCBX"  UDS:  Summary  Date Value Ref Range Status  01/25/2023 Note  Final    Comment:    ==================================================================== [employer] Select 13 (MW) ==================================================================== Test                             Result       Flag       Units  Drug Present not Declared for Prescription Verification   Oxazepam                       35           UNEXPECTED ng/mg creat   Temazepam                      54           UNEXPECTED ng/mg creat    Oxazepam and temazepam are expected metabolites of diazepam.    Oxazepam is also an expected metabolite of other benzodiazepine    drugs, including chlordiazepoxide, prazepam, clorazepate, halazepam,    and temazepam.  Oxazepam and temazepam are available as scheduled    prescription medications.  Drug Absent but Declared for Prescription Verification   Oxycodone                      Not Detected UNEXPECTED ng/mg creat ==================================================================== Test                       Result    Flag   Units      Ref Range   Creatinine              69               mg/dL      >=40 ==================================================================== Declared Medications:  The flagging and interpretation on this report are based on the  following declared medications.  Unexpected results may arise from  inaccuracies in the declared medications.   **Note: The testing scope of this panel includes these medications:   Oxycodone  Oxycodone (Percocet)   **Note: The testing scope of this panel does not include the  following reported medications:   Acetaminophen (Percocet)  Albuterol (Ventolin HFA)  Albuterol (Duoneb)  Amlodipine (Norvasc)  Budesonide (Breztri Aerosphere)  Buspirone (Buspar)  Chlorthalidone (Hygroton)  Clobetasol (Temovate)  Desvenlafaxine (Pristiq)  Docusate (Stool Softener)  Fluticasone (Flonase)  Formoterol (Breztri Aerosphere)  Glycopyrrolate (  Breztri Aerosphere)  Hydrochlorothiazide (Hydrodiuril)  Ipratropium (Duoneb)  Meclizine (Antivert)  Metformin (Glucophage)  Methocarbamol (Robaxin)  Metoprolol (Toprol)  Minoxidil (Loniten)  Multivitamin  Ondansetron (Zofran)  Prednisone  Pregabalin (Lyrica)  Rosuvastatin (Crestor)  Semaglutide (Ozempic)  Supplement  Vitamin D2 (Drisdol) ==================================================================== For clinical consultation, please call [phone]. ====================================================================       ROS  Constitutional: Denies any fever or chills Gastrointestinal: No reported hemesis, hematochezia, vomiting, or acute GI distress Musculoskeletal:  Low back pain Neurological: No reported episodes of acute onset apraxia, aphasia, dysarthria, agnosia, amnesia, paralysis, loss of coordination, or loss of consciousness  Medication Review  Microlet Lancets, Multiple Vitamins-Minerals, OVER THE COUNTER MEDICATION, Semaglutide (2 MG/DOSE), Vitamin D  (Ergocalciferol), albuterol, amLODipine, busPIRone, chlorthalidone, cyanocobalamin, desvenlafaxine, fluticasone, glucose blood, hydrochlorothiazide, ipratropium-albuterol, metFORMIN, methocarbamol, metoprolol succinate, minoxidil, oxyCODONE, oxyCODONE-acetaminophen, predniSONE, pregabalin, and rosuvastatin  History Review  Allergy: Candace Clayton has no known allergies. Drug: Candace Clayton  reports no history of drug use. Alcohol:  reports no history of alcohol use. Tobacco:  reports that she has never smoked. She has never used smokeless tobacco. Social: Candace Clayton  reports that she has never smoked. She has never used smokeless tobacco. She reports that she does not drink alcohol and does not use drugs. Medical:  has a past medical history of Anginal pain (HCC), Anxiety, Cholecystolithiasis, Depression, Diabetes mellitus without complication (HCC), GERD (gastroesophageal reflux disease), History of C-section (1992), Hypertension, and Sleep apnea. Surgical: Candace Clayton  has a past surgical history that includes Cesarean section (1992); Finger surgery (Left, 2011); Hematoma evacuation (Left, 1999); Colonoscopy; Laparoscopic salpingo oophorectomy (Left, 04/02/2018); Colonoscopy with propofol (N/A, 03/14/2019); and Cholecystectomy (10/26/2020). Family: family history includes Brain cancer in her mother; Breast cancer (age of onset: 70) in her mother; Cancer - Colon in her mother; Cancer - Other in her mother; Colon cancer in her brother and mother; Heart Problems in her brother and father.  Laboratory Chemistry Profile   Renal Lab Results  Component Value Date   BUN 14 12/04/2022   CREATININE 0.83 12/04/2022   BCR 17 12/04/2022   GFRAA 106 02/23/2020   GFRNONAA >60 02/21/2022    Hepatic Lab Results  Component Value Date   AST 12 12/04/2022   ALT 18 12/04/2022   ALBUMIN 4.3 12/04/2022   ALKPHOS 99 12/04/2022   LIPASE 26 02/21/2022    Electrolytes Lab Results  Component Value Date   NA 139  12/04/2022   K 4.8 12/04/2022   CL 102 12/04/2022   CALCIUM 9.4 12/04/2022    Bone Lab Results  Component Value Date   VD25OH 35.6 10/02/2022    Inflammation (CRP: Acute Phase) (ESR: Chronic Phase) Lab Results  Component Value Date   CRP 2 05/24/2021   ESRSEDRATE 12 05/24/2021         Note: Above Lab results reviewed.  Recent Imaging Review  MR BRAIN/IAC W WO CONTRAST CLINICAL DATA:  Dizziness.  Facial weakness.  Numbness.  EXAM: MRI HEAD WITHOUT AND WITH CONTRAST  TECHNIQUE: Multiplanar, multiecho pulse sequences of the brain and surrounding structures were obtained without and with intravenous contrast.  CONTRAST:  10 mL Vueway  COMPARISON:  None Available.  FINDINGS: Brain: There is no evidence of an acute infarct, intracranial hemorrhage, mass, midline shift, or extra-axial fluid collection. The ventricles and sulci are normal. A few small T2 hyperintensities in the cerebral white matter are nonspecific and within normal limits for age. No abnormal enhancement is identified.  Dedicated imaging through the internal auditory canals demonstrates  a normal course of cranial nerves VII and VIII without evidence of a mass or abnormal enhancement. The inner ear structures demonstrate normal signal and morphology bilaterally. No mass is present in the cerebellopontine angles.  Vascular: Major intracranial vascular flow voids are preserved.  Skull and upper cervical spine: Unremarkable bone marrow signal.  Sinuses/Orbits: Unremarkable orbits. A centrally clear paranasal sinuses and mastoid air cells.  Other: None.  IMPRESSION: Unremarkable appearance of the brain and internal auditory canals.  Electronically Signed   By: Sebastian Ache M.D.   On: 02/21/2023 14:31    EXAM: MRI LUMBAR SPINE WITHOUT CONTRAST   TECHNIQUE: Multiplanar, multisequence MR imaging of the lumbar spine was performed. No intravenous contrast was administered.   COMPARISON:  Plain  films lumbar spine 10/26/2017.   FINDINGS: Segmentation:  Standard.   Alignment: Facet mediated 0.4 cm anterolisthesis L4 on L5 is identified. Trace retrolisthesis T12 on L1 is also noted. There is exaggeration of the normal lumbar lordosis.   Vertebrae:  No fracture or worrisome lesion.   Conus medullaris and cauda equina: Conus extends to the T12-L1 level. Conus and cauda equina appear normal.   Paraspinal and other soft tissues: Stones measuring up to 1.9 cm are seen in the gallbladder. A partially visualized cystic lesion in the left pelvis measures 5.5 cm in diameter.   Disc levels:   T10-11 and T11-12 are imaged in the sagittal plane only. There is loss of disc space height and a shallow bulge at T11-12. The disc appears slightly deform the ventral cord although the central canal appears open. Mild loss of disc space height T11-12 and a minimal bulge without stenosis also noted.   T12-L1: Shallow broad-based central protrusion with caudal extension. The central canal and foramina are open.   L1-2: Mild disc bulge without stenosis.   L2-3: Mild disc bulge and facet arthropathy without stenosis.   L3-4: Minimal disc bulge and mild facet degenerative disease without stenosis.   L4-5: Advanced bilateral facet degenerative change is seen. The disc is uncovered with a shallow bulge. Mild central canal narrowing is present. The foramina are open.   L5-S1: Bilateral facet degenerative disease and a shallow disc bulge are identified without central canal or foraminal stenosis. Far left paravertebral endplate spur incidentally noted.   IMPRESSION: Lumbar spondylosis most notable at L4-5 where advanced facet degenerative disease results in 0.4 cm anterolisthesis. There is mild central canal narrowing at this level. No nerve root compression.   Shallow broad-based disc bulge imaged in the sagittal plane only at T11-12 and slightly deforms the ventral cord but the central  canal appears open at this level   5.5 cm cystic lesion left pelvis is partially imaged and could be ovarian in origin. Recommend pelvic ultrasound for further evaluation. This recommendation follows ACR consensus guidelines: White Paper of the ACR Incidental Findings Committee II on Adnexal Findings. J Am Coll Radiol [phone].   Gallstones.     Electronically Signed   By: Drusilla Kanner M.D.   On: 02/15/2018 11:18  Note: Reviewed        Physical Exam  General appearance: Well nourished, well developed, and well hydrated. In no apparent acute distress Mental status: Alert, oriented x 3 (person, place, & time)       Respiratory: No evidence of acute respiratory distress Eyes: PERLA Vitals: BP 135/67   Pulse 76   Temp (!) 97.2 F (36.2 C)   Resp 16   Ht 5\' 9"  (1.753 m)   Wt Marland Kitchen)  310 lb (140.6 kg)   SpO2 100%   BMI 45.78 kg/m  BMI: Estimated body mass index is 45.78 kg/m as calculated from the following:   Height as of this encounter: 5\' 9"  (1.753 m).   Weight as of this encounter: 310 lb (140.6 kg). Ideal: Ideal body weight: 66.2 kg (145 lb 15.1 oz) Adjusted ideal body weight: 96 kg (211 lb 9.1 oz)  Lumbar Spine Area Exam  Skin & Axial Inspection: No masses, redness, or swelling Alignment: Symmetrical Functional ROM: Restricted ROM       Stability: No instability detected Muscle Tone/Strength: Functionally intact. No obvious neuro-muscular anomalies detected. Sensory (Neurological): Musculoskeletal pain pattern   Provocative Tests: Hyperextension/rotation test: Positive bilaterally for facet joint pain. Lumbar quadrant test (Kemp's test): Positive bilaterally for facet joint pain   Ambulation: Unassisted Gait: Relatively normal for age and body habitus Posture: WNL  Lower Extremity Exam      Side: Right lower extremity   Side: Left lower extremity  Stability: No instability observed           Stability: No instability observed          Skin & Extremity  Inspection: Skin color, temperature, and hair growth are WNL. No peripheral edema or cyanosis. No masses, redness, swelling, asymmetry, or associated skin lesions. No contractures.   Skin & Extremity Inspection: Skin color, temperature, and hair growth are WNL. No peripheral edema or cyanosis. No masses, redness, swelling, asymmetry, or associated skin lesions. No contractures.  Functional ROM: Unrestricted ROM                   Functional ROM: Unrestricted ROM                  Muscle Tone/Strength: Functionally intact. No obvious neuro-muscular anomalies detected.   Muscle Tone/Strength: Functionally intact. No obvious neuro-muscular anomalies detected.  Sensory (Neurological): Unimpaired         Sensory (Neurological): Unimpaired        DTR: Patellar: deferred today Achilles: deferred today Plantar: deferred today   DTR: Patellar: deferred today Achilles: deferred today Plantar: deferred today  Palpation: No palpable anomalies   Palpation: No palpable anomalies       Assessment   Diagnosis Status  1. Lumbar facet arthropathy    2. Lumbar degenerative disc disease   3. Chronic pain syndrome   4. Lumbar spondylosis   5. Cervical radicular pain (right C5/6)    Controlled Controlled Controlled   Updated Problems: No problems updated.  Plan of Care  1. Lumbar facet arthropathy  - oxyCODONE-acetaminophen (PERCOCET/ROXICET) 5-325 MG tablet; Take 1 tablet by mouth every 8 (eight) hours as needed for severe pain (pain score 7-10).  Dispense: 90 tablet; Refill: 0 - oxyCODONE-acetaminophen (PERCOCET/ROXICET) 5-325 MG tablet; Take 1 tablet by mouth every 8 (eight) hours as needed for severe pain (pain score 7-10).  Dispense: 90 tablet; Refill: 0 - oxyCODONE-acetaminophen (PERCOCET/ROXICET) 5-325 MG tablet; Take 1 tablet by mouth every 8 (eight) hours as needed for severe pain (pain score 7-10).  Dispense: 90 tablet; Refill: 0 - pregabalin (LYRICA) 100 MG capsule; 100 mg qAM, 200 mg qhs   Dispense: 90 capsule; Refill: 5 - Radiofrequency,Lumbar; Future  2. Lumbar degenerative disc disease - oxyCODONE-acetaminophen (PERCOCET/ROXICET) 5-325 MG tablet; Take 1 tablet by mouth every 8 (eight) hours as needed for severe pain (pain score 7-10).  Dispense: 90 tablet; Refill: 0 - oxyCODONE-acetaminophen (PERCOCET/ROXICET) 5-325 MG tablet; Take 1 tablet by mouth every 8 (eight)  hours as needed for severe pain (pain score 7-10).  Dispense: 90 tablet; Refill: 0 - oxyCODONE-acetaminophen (PERCOCET/ROXICET) 5-325 MG tablet; Take 1 tablet by mouth every 8 (eight) hours as needed for severe pain (pain score 7-10).  Dispense: 90 tablet; Refill: 0 - pregabalin (LYRICA) 100 MG capsule; 100 mg qAM, 200 mg qhs  Dispense: 90 capsule; Refill: 5  3. Chronic pain syndrome - oxyCODONE-acetaminophen (PERCOCET/ROXICET) 5-325 MG tablet; Take 1 tablet by mouth every 8 (eight) hours as needed for severe pain (pain score 7-10).  Dispense: 90 tablet; Refill: 0 - oxyCODONE-acetaminophen (PERCOCET/ROXICET) 5-325 MG tablet; Take 1 tablet by mouth every 8 (eight) hours as needed for severe pain (pain score 7-10).  Dispense: 90 tablet; Refill: 0 - oxyCODONE-acetaminophen (PERCOCET/ROXICET) 5-325 MG tablet; Take 1 tablet by mouth every 8 (eight) hours as needed for severe pain (pain score 7-10).  Dispense: 90 tablet; Refill: 0 - pregabalin (LYRICA) 100 MG capsule; 100 mg qAM, 200 mg qhs  Dispense: 90 capsule; Refill: 5  4. Lumbar spondylosis - pregabalin (LYRICA) 100 MG capsule; 100 mg qAM, 200 mg qhs  Dispense: 90 capsule; Refill: 5 - Radiofrequency,Lumbar; Future  5. Cervical radicular pain (right C5/6) - pregabalin (LYRICA) 100 MG capsule; 100 mg qAM, 200 mg qhs  Dispense: 90 capsule; Refill: 5    Ms. ANGELISSE Clayton has a current medication list which includes the following long-term medication(s): albuterol, amlodipine, chlorthalidone, desvenlafaxine, fluticasone, hydrochlorothiazide, ipratropium-albuterol,  metformin, metoprolol succinate, minoxidil, rosuvastatin, [START ON 07/29/2023] oxycodone-acetaminophen, [START ON 08/28/2023] oxycodone-acetaminophen, [START ON 09/27/2023] oxycodone-acetaminophen, and pregabalin.  Pharmacotherapy (Medications Ordered): Meds ordered this encounter  Medications   oxyCODONE-acetaminophen (PERCOCET/ROXICET) 5-325 MG tablet    Sig: Take 1 tablet by mouth every 8 (eight) hours as needed for severe pain (pain score 7-10).    Dispense:  90 tablet    Refill:  0    For chronic pain syndrome. To last for 30 days from fill date.   oxyCODONE-acetaminophen (PERCOCET/ROXICET) 5-325 MG tablet    Sig: Take 1 tablet by mouth every 8 (eight) hours as needed for severe pain (pain score 7-10).    Dispense:  90 tablet    Refill:  0    For chronic pain syndrome. To last for 30 days from fill date.   oxyCODONE-acetaminophen (PERCOCET/ROXICET) 5-325 MG tablet    Sig: Take 1 tablet by mouth every 8 (eight) hours as needed for severe pain (pain score 7-10).    Dispense:  90 tablet    Refill:  0    For chronic pain syndrome. To last for 30 days from fill date.   pregabalin (LYRICA) 100 MG capsule    Sig: 100 mg qAM, 200 mg qhs    Dispense:  90 capsule    Refill:  5    Fill one day early if pharmacy is closed on scheduled refill date. May substitute for generic if available.   Orders:  Orders Placed This Encounter  Procedures   Radiofrequency,Lumbar    Standing Status:   Future    Expected Date:   08/13/2023    Expiration Date:   10/24/2023    Scheduling Instructions:     Side(s): Bilateral     Level(s): L3, L4, L5,  Medial Branch Nerve(s)     Sedation: With Sedation     Scheduling Timeframe: As soon as pre-approved    Where will this procedure be performed?:   [facility]   Follow-up plan:   Return in about 18 days (around 08/13/2023)  for B/L L3, 4, 5 RFA, in clinic (PO Valium 5mg ).      Status post diagnostic bilateral L3, L4, L5, S1 facet medial branch nerve  blocks, status post right L3, L4, L5, S1 RFA on 05/05/2019, 04/15/20; left L3, L4, L5 RFA 05/03/2020.           Recent Visits Date Type Provider Dept  05/01/23 Office Visit Edward Jolly, MD [facility]  Showing recent visits within past 90 days and meeting all other requirements Today's Visits Date Type Provider Dept  07/26/23 Office Visit Edward Jolly, MD [facility]  Showing today's visits and meeting all other requirements Future Appointments Date Type Provider Dept  10/16/23 Appointment Edward Jolly, MD [facility]  Showing future appointments within next 90 days and meeting all other requirements  I discussed the assessment and treatment plan with the patient. The patient was provided an opportunity to ask questions and all were answered. The patient agreed with the plan and demonstrated an understanding of the instructions.  Patient advised to call back or seek an in-person evaluation if the symptoms or condition worsens.  Duration of encounte72minutes.  Total time on encounter, as per AMA guidelines included both the face-to-face and non-face-to-face time personally spent by the physician and/or other qualified health care professional(s) on the day of the encounter (includes time in activities that require the physician or other qualified health care professional and does not include time in activities normally performed by clinical staff). Physician's time may include the following activities when performed: Preparing to see the patient (e.g., pre-charting review of records, searching for previously ordered imaging, lab work, and nerve conduction tests) Review of prior analgesic pharmacotherapies. Reviewing PMP Interpreting ordered tests (e.g., lab work, imaging, nerve conduction tests) Performing post-procedure evaluations, including interpretation of diagnostic procedures Obtaining and/or reviewing separately obtained history Performing a  medically appropriate examination and/or evaluation Counseling and educating the patient/family/caregiver Ordering medications, tests, or procedures Referring and communicating with other health care professionals (when not separately reported) Documenting clinical information in the electronic or other health record Independently interpreting results (not separately reported) and communicating results to the patient/ family/caregiver Care coordination (not separately reported)  Note by: Edward Jolly, MD Date: 07/26/2023; Time: 9:22 AM

## 2023-07-26 NOTE — Progress Notes (Signed)
Nursing Pain Medication Assessment:  Safety precautions to be maintained throughout the outpatient stay will include: orient to surroundings, keep bed in low position, maintain call bell within reach at all times, provide assistance with transfer out of bed and ambulation.  Medication Inspection Compliance: Pill count conducted under aseptic conditions, in front of the patient. Neither the pills nor the bottle was removed from the patient's sight at any time. Once count was completed pills were immediately returned to the patient in their original bottle.  Medication: Oxycodone/APAP Pill/Patch Count:  18 of 90 pills remain Pill/Patch Appearance: Markings consistent with prescribed medication Bottle Appearance: Standard pharmacy container. Clearly labeled. Filled Date: 01 / 02 / 2025 Last Medication intake:  Today

## 2023-07-29 ENCOUNTER — Encounter: Payer: Self-pay | Admitting: Nurse Practitioner

## 2023-08-20 ENCOUNTER — Ambulatory Visit
Payer: No Typology Code available for payment source | Admitting: Student in an Organized Health Care Education/Training Program

## 2023-08-21 ENCOUNTER — Other Ambulatory Visit: Payer: Self-pay | Admitting: Nurse Practitioner

## 2023-08-21 DIAGNOSIS — Z760 Encounter for issue of repeat prescription: Secondary | ICD-10-CM

## 2023-09-12 ENCOUNTER — Ambulatory Visit (HOSPITAL_BASED_OUTPATIENT_CLINIC_OR_DEPARTMENT_OTHER)
Payer: No Typology Code available for payment source | Admitting: Student in an Organized Health Care Education/Training Program

## 2023-09-12 ENCOUNTER — Other Ambulatory Visit: Payer: Self-pay | Admitting: Student in an Organized Health Care Education/Training Program

## 2023-09-12 ENCOUNTER — Ambulatory Visit
Admission: RE | Admit: 2023-09-12 | Discharge: 2023-09-12 | Disposition: A | Discharge status: Home / Self Care | Source: Ambulatory Visit | Attending: Student in an Organized Health Care Education/Training Program | Admitting: Student in an Organized Health Care Education/Training Program

## 2023-09-12 DIAGNOSIS — M47816 Spondylosis without myelopathy or radiculopathy, lumbar region: Secondary | ICD-10-CM | POA: Diagnosis present

## 2023-09-12 DIAGNOSIS — G894 Chronic pain syndrome: Secondary | ICD-10-CM | POA: Insufficient documentation

## 2023-09-12 MED ORDER — DEXAMETHASONE SODIUM PHOSPHATE 10 MG/ML IJ SOLN
INTRAMUSCULAR | Status: AC
Start: 2023-09-12 — End: ?
  Filled 2023-09-12: qty 2

## 2023-09-12 MED ORDER — LIDOCAINE HCL 2 % IJ SOLN
20.0000 mL | Freq: Once | INTRAMUSCULAR | Status: AC
  Administered 2023-09-12: 400 mg

## 2023-09-12 MED ORDER — LIDOCAINE HCL 2 % IJ SOLN
INTRAMUSCULAR | Status: AC
  Filled 2023-09-12: qty 20

## 2023-09-12 MED ORDER — ROPIVACAINE HCL 2 MG/ML IJ SOLN
INTRAMUSCULAR | Status: AC
  Filled 2023-09-12: qty 20

## 2023-09-12 MED ORDER — DEXAMETHASONE SODIUM PHOSPHATE 10 MG/ML IJ SOLN
20.0000 mg | Freq: Once | INTRAMUSCULAR | Status: AC
  Administered 2023-09-12: 20 mg

## 2023-09-12 MED ORDER — DIAZEPAM 5 MG PO TABS
ORAL_TABLET | ORAL | Status: AC
  Filled 2023-09-12: qty 2

## 2023-09-12 MED ORDER — DIAZEPAM 5 MG PO TABS
10.0000 mg | ORAL_TABLET | ORAL | Status: AC
  Administered 2023-09-12: 10 mg via ORAL

## 2023-09-12 MED ORDER — ROPIVACAINE HCL 2 MG/ML IJ SOLN
18.0000 mL | Freq: Once | INTRAMUSCULAR | Status: AC
  Administered 2023-09-12: 18 mL via PERINEURAL

## 2023-09-12 NOTE — Progress Notes (Signed)
 Safety precautions to be maintained throughout the outpatient stay will include: orient to surroundings, keep bed in low position, maintain call bell within reach at all times, provide assistance with transfer out of bed and ambulation.

## 2023-09-12 NOTE — Progress Notes (Signed)
 PROVIDER NOTE: Information contained herein reflects review and annotations entered in association with encounter. Interpretation of such information and data should be left to medically-trained personnel. Information provided to patient can be located elsewhere in the medical record under "Patient Instructions". Document created using STT-dictation technology, any transcriptional errors that may result from process are unintentional.    Patient: Candace Clayton  Service Category: Procedure  Provider: Edward Jolly, MD  DOB: 1961-04-20  DOS: 09/12/2023  Location: [facility]  MRN: [id number]  Setting: Ambulatory - outpatient  Referring Provider: Edward Jolly, MD  Type: Established Patient  Specialty: Interventional Pain Management  PCP: Sallyanne Kuster, NP   Primary Reason for Visit: Interventional Pain Management Treatment. CC: Back Pain     Procedure:          Anesthesia, Analgesia, Anxiolysis:  Type: Thermal Lumbar Facet, Medial Branch Radiofrequency Ablation/Neurotomy           Primary Purpose: Therapeutic Region: Posterolateral Lumbosacral Spine Level: L3, L4, L5, Medial Branch Level(s). These levels will denervate the L3-4, L4-5, lumbar facet joints. Laterality: RIGHT & LEFT  Type: Local Anesthesia  Local Anesthetic: Lidocaine 1-2% Anxiolysis: P.o. Valium 10 mg     Position: Prone   Indications: 1. Lumbar facet arthropathy    2. Lumbar spondylosis    [name] has been dealing with the above chronic pain for longer than three months and has either failed to respond, was unable to tolerate, or simply did not get enough benefit from other more conservative therapies including, but not limited to: 1. Over-the-counter medications 2. Anti-inflammatory medications 3. Muscle relaxants 4. Membrane stabilizers 5. Opioids 6. Physical therapy and/or chiropractic manipulation 7. Modalities (Heat, ice, etc.) 8. Invasive techniques such as nerve blocks. Ms. Bohl  has attained more than 50% relief of the pain from a series of diagnostic injections conducted in separate occasions.  Pain Score: Pre-procedure: 5 /10 Post-procedure: 5 /10    Pre-op H&P Assessment:  Candace Clayton is a 63 y.o. (year old), female patient, seen today for interventional treatment. She  has a past surgical history that includes Cesarean section (1992); Finger surgery (Left, 2011); Hematoma evacuation (Left, 1999); Colonoscopy; Laparoscopic salpingo oophorectomy (Left, 04/02/2018); Colonoscopy with propofol (N/A, 03/14/2019); and Cholecystectomy (10/26/2020). Candace Clayton has a current medication list which includes the following prescription(s): albuterol, amlodipine, buspirone, chlorthalidone, cyanocobalamin, desvenlafaxine, fluticasone, contour next test, hydrochlorothiazide, ipratropium-albuterol, metformin, methocarbamol, metoprolol succinate, microlet lancets, minoxidil, multiple vitamins-minerals, OVER THE COUNTER MEDICATION, oxycodone, oxycodone-acetaminophen, [START ON 09/27/2023] oxycodone-acetaminophen, prednisone, pregabalin, rosuvastatin, ozempic (2 mg/dose), vitamin d (ergocalciferol), and oxycodone-acetaminophen. Her primarily concern today is the Back Pain   Initial Vital Signs:  Pulse/HCG Rate: 66ECG Heart Rate: 61 Temp: 97.8 F (36.6 C) Resp: 16 BP: 135/68 SpO2: 100 %  BMI: Estimated body mass index is 45.48 kg/m as calculated from the following:   Height as of this encounter: 5\' 9"  (1.753 m).   Weight as of this encounter: 308 lb (139.7 kg).  Risk Assessment: Allergies: Reviewed. She has no known allergies.  Allergy Precautions: None required Coagulopathies: Reviewed. None identified.  Blood-thinner therapy: None at this time Active Infection(s): Reviewed. None identified. Candace Clayton is afebrile  Site Confirmation: Candace Clayton was asked to confirm the procedure and laterality before marking the site Procedure checklist: Completed Consent: Before the  procedure and under the influence of no sedative(s), amnesic(s), or anxiolytics, the patient was informed of the treatment options, risks and possible complications. To fulfill our ethical and legal obligations, as recommended by  the American Medical Association's Code of Ethics, I have informed the patient of my clinical impression; the nature and purpose of the treatment or procedure; the risks, benefits, and possible complications of the intervention; the alternatives, including doing nothing; the risk(s) and benefit(s) of the alternative treatment(s) or procedure(s); and the risk(s) and benefit(s) of doing nothing. The patient was provided information about the general risks and possible complications associated with the procedure. These may include, but are not limited to: failure to achieve desired goals, infection, bleeding, organ or nerve damage, allergic reactions, paralysis, and death. In addition, the patient was informed of those risks and complications associated to Spine-related procedures, such as failure to decrease pain; infection (i.e.: Meningitis, epidural or intraspinal abscess); bleeding (i.e.: epidural hematoma, subarachnoid hemorrhage, or any other type of intraspinal or peri-dural bleeding); organ or nerve damage (i.e.: Any type of peripheral nerve, nerve root, or spinal cord injury) with subsequent damage to sensory, motor, and/or autonomic systems, resulting in permanent pain, numbness, and/or weakness of one or several areas of the body; allergic reactions; (i.e.: anaphylactic reaction); and/or death. Furthermore, the patient was informed of those risks and complications associated with the medications. These include, but are not limited to: allergic reactions (i.e.: anaphylactic or anaphylactoid reaction(s)); adrenal axis suppression; blood sugar elevation that in diabetics may result in ketoacidosis or comma; water retention that in patients with history of congestive heart failure  may result in shortness of breath, pulmonary edema, and decompensation with resultant heart failure; weight gain; swelling or edema; medication-induced neural toxicity; particulate matter embolism and blood vessel occlusion with resultant organ, and/or nervous system infarction; and/or aseptic necrosis of one or more joints. Finally, the patient was informed that Medicine is not an exact science; therefore, there is also the possibility of unforeseen or unpredictable risks and/or possible complications that may result in a catastrophic outcome. The patient indicated having understood very clearly. We have given the patient no guarantees and we have made no promises. Enough time was given to the patient to ask questions, all of which were answered to the patient's satisfaction. Ms. Stierwalt has indicated that she wanted to continue with the procedure. Attestation: I, the ordering provider, attest that I have discussed with the patient the benefits, risks, side-effects, alternatives, likelihood of achieving goals, and potential problems during recovery for the procedure that I have provided informed consent. Date  Time: 09/12/2023  7:48 AM  Pre-Procedure Preparation:  Monitoring: As per clinic protocol. Respiration, ETCO2, SpO2, BP, heart rate and rhythm monitor placed and checked for adequate function Safety Precautions: Patient was assessed for positional comfort and pressure points before starting the procedure. Time-out: I initiated and conducted the "Time-out" before starting the procedure, as per protocol. The patient was asked to participate by confirming the accuracy of the "Time Out" information. Verification of the correct person, site, and procedure were performed and confirmed by me, the nursing staff, and the patient. "Time-out" conducted as per Joint Commission's Universal Protocol (UP.01.01.01). Time: 0845  Description of Procedure:          Laterality: Right & LEFT Levels: L3, L4, L5,  Medial Branch Level(s), at the L3-4, L4-5 umbar facet joints. Area Prepped: Lumbosacral DuraPrep (Iodine Povacrylex [0.7% available iodine] and Isopropyl Alcohol, 74% w/w) Safety Precautions: Aspiration looking for blood return was conducted prior to all injections. At no point did we inject any substances, as a needle was being advanced. Before injecting, the patient was told to immediately notify me if she was experiencing any  new onset of "ringing in the ears, or metallic taste in the mouth". No attempts were made at seeking any paresthesias. Safe injection practices and needle disposal techniques used. Medications properly checked for expiration dates. SDV (single dose vial) medications used. After the completion of the procedure, all disposable equipment used was discarded in the proper designated medical waste containers. Local Anesthesia: Protocol guidelines were followed. The patient was positioned over the fluoroscopy table. The area was prepped in the usual manner. The time-out was completed. The target area was identified using fluoroscopy. A 12-in long, straight, sterile hemostat was used with fluoroscopic guidance to locate the targets for each level blocked. Once located, the skin was marked with an approved surgical skin marker. Once all sites were marked, the skin (epidermis, dermis, and hypodermis), as well as deeper tissues (fat, connective tissue and muscle) were infiltrated with a small amount of a short-acting local anesthetic, loaded on a 10cc syringe with a 25G, 1.5-in  Needle. An appropriate amount of time was allowed for local anesthetics to take effect before proceeding to the next step. Local Anesthetic: Lidocaine 2.0% The unused portion of the local anesthetic was discarded in the proper designated containers. Technical explanation of process:  Radiofrequency Ablation (RFA) L3 Medial Branch Nerve RFA: The target area for the L3 medial branch is at the junction of the  postero-lateral aspect of the superior articular process and the superior, posterior, and medial edge of the transverse process of L4. Under fluoroscopic guidance, a Radiofrequency needle was inserted until contact was made with os over the superior postero-lateral aspect of the pedicular shadow (target area). Sensory and motor testing was conducted to properly adjust the position of the needle. Once satisfactory placement of the needle was achieved, the numbing solution was slowly injected after negative aspiration for blood. 2.0 mL of the nerve block solution was injected without difficulty or complication. After waiting for at least 3 minutes, the ablation was performed. Once completed, the needle was removed intact. L4 Medial Branch Nerve RFA: The target area for the L4 medial branch is at the junction of the postero-lateral aspect of the superior articular process and the superior, posterior, and medial edge of the transverse process of L5. Under fluoroscopic guidance, a Radiofrequency needle was inserted until contact was made with os over the superior postero-lateral aspect of the pedicular shadow (target area). Sensory and motor testing was conducted to properly adjust the position of the needle. Once satisfactory placement of the needle was achieved, the numbing solution was slowly injected after negative aspiration for blood. 2.0 mL of the nerve block solution was injected without difficulty or complication. After waiting for at least 3 minutes, the ablation was performed. Once completed, the needle was removed intact. L5 Medial Branch Nerve RFA: The target area for the L5 medial branch is at the junction of the postero-lateral aspect of the superior articular process of S1 and the superior, posterior, and medial edge of the sacral ala. Under fluoroscopic guidance, a Radiofrequency needle was inserted until contact was made with os over the superior postero-lateral aspect of the pedicular shadow (target  area). Sensory and motor testing was conducted to properly adjust the position of the needle. Once satisfactory placement of the needle was achieved, the numbing solution was slowly injected after negative aspiration for blood. 2.0 mL of the nerve block solution was injected without difficulty or complication. After waiting for at least 3 minutes, the ablation was performed. Once completed, the needle was removed intact.  Radiofrequency  lesioning (ablation):  Radiofrequency Generator: NeuroTherm [id number] Sensory Stimulation Parameters: 50 Hz was used to locate & identify the nerve, making sure that the needle was positioned such that there was no sensory stimulation below 0.3 V or above 0.7 V. Motor Stimulation Parameters: 2 Hz was used to evaluate the motor component. Care was taken not to lesion any nerves that demonstrated motor stimulation of the lower extremities at an output of less than 2.5 times that of the sensory threshold, or a maximum of 2.0 V. Lesioning Technique Parameters: Standard Radiofrequency settings. (Not bipolar or pulsed.) Temperature Settings: 80 degrees C Lesioning time: 60 seconds Intra-operative Compliance: Compliant Materials & Medications: Needle(s) (Electrode/Cannula) Type: Teflon-coated, curved tip, Radiofrequency needle(s) Gauge: 22G Length: 10cm Numbing solution:12 cc solution made of 10cc of 0.2% ropivacaine, 2 cc of Decadron 10 mg/cc. 2cc injected at each level above on RIGHT & LEFT after sensorimotor testing prior to lesioning   Once the entire procedure was completed, the treated area was cleaned, making sure to leave some of the prepping solution back to take advantage of its long term bactericidal properties.    Illustration of the posterior view of the lumbar spine and the posterior neural structures. Laminae of L2 through S1 are labeled. DPRL5, dorsal primary ramus of L5; DPRS1, dorsal primary ramus of S1; DPR3, dorsal primary ramus of L3; FJ, facet  (zygapophyseal) joint L3-L4; I, inferior articular process of L4; LB1, lateral branch of dorsal primary ramus of L1; IAB, inferior articular branches from L3 medial branch (supplies L4-L5 facet joint); IBP, intermediate branch plexus; MB3, medial branch of dorsal primary ramus of L3; NR3, third lumbar nerve root; S, superior articular process of L5; SAB, superior articular branches from L4 (supplies L4-5 facet joint also); TP3, transverse process of L3.  Vitals:   09/12/23 0855 09/12/23 0900 09/12/23 0905 09/12/23 0912  BP: (!) 143/91 (!) 148/88 (!) 150/91 (!) 148/92  Pulse:      Resp: 18 17 18 17   Temp:      SpO2: 97% 97% 97% 97%  Weight:      Height:       Start Time: 0845 hrs. End Time: 0912 hrs.  Imaging Guidance (Spinal):          Type of Imaging Technique: Fluoroscopy Guidance (Spinal) Indication(s): Assistance in needle guidance and placement for procedures requiring needle placement in or near specific anatomical locations not easily accessible without such assistance. Exposure Time: Please see nurses notes. Contrast: None used. Fluoroscopic Guidance: I was personally present during the use of fluoroscopy. "Tunnel Vision Technique" used to obtain the best possible view of the target area. Parallax error corrected before commencing the procedure. "Direction-depth-direction" technique used to introduce the needle under continuous pulsed fluoroscopy. Once target was reached, antero-posterior, oblique, and lateral fluoroscopic projection used confirm needle placement in all planes. Images permanently stored in EMR. Interpretation: No contrast injected. I personally interpreted the imaging intraoperatively. Adequate needle placement confirmed in multiple planes. Permanent images saved into the patient's record.   Post-operative Assessment:  Post-procedure Vital Signs:  Pulse/HCG Rate: 6666 Temp: 97.8 F (36.6 C) Resp: 17 BP: (!) 148/92 SpO2: 97 %  EBL: None  Complications: No  immediate post-treatment complications observed by team, or reported by patient.  Note: The patient tolerated the entire procedure well. A repeat set of vitals were taken after the procedure and the patient was kept under observation following institutional policy, for this type of procedure. Post-procedural neurological assessment was performed, showing return to baseline, prior  to discharge. The patient was provided with post-procedure discharge instructions, including a section on how to identify potential problems. Should any problems arise concerning this procedure, the patient was given instructions to immediately contact us, at any time, without hesitation. In any case, we plan to contact the patient by telephone for a follow-up status report regarding this interventional procedure.  Comments:  No additional relevant information.  Plan of Care  Orders:  No orders of the defined types were placed in this encounter.  Chronic Opioid Analgesic:  Percocet 5 mg 3 times daily as needed, quantity 90/month; MME equals 22.5    Medications ordered for procedure: Meds ordered this encounter  Medications   lidocaine (XYLOCAINE) 2 % (with pres) injection 400 mg   diazepam (VALIUM) tablet 10 mg    Make sure Flumazenil is available in the pyxis when using this medication. If oversedation occurs, administer 0.2 mg IV over 15 sec. If after 45 sec no response, administer 0.2 mg again over 1 min; may repeat at 1 min intervals; not to exceed 4 doses (1 mg)   ropivacaine (PF) 2 mg/mL (0.2%) (NAROPIN) injection 18 mL   dexamethasone (DECADRON) injection 20 mg   Medications administered: We administered lidocaine, diazepam, ropivacaine (PF) 2 mg/mL (0.2%), and dexamethasone.  See the medical record for exact dosing, route, and time of administration.  Follow-up plan:   Return for Keep sch. appt.      Recent Visits Date Type Provider Dept  07/26/23 Office Visit Edward Jolly, MD [facility]   Showing recent visits within past 90 days and meeting all other requirements Today's Visits Date Type Provider Dept  09/12/23 Procedure visit Edward Jolly, MD [facility]  Showing today's visits and meeting all other requirements Future Appointments Date Type Provider Dept  10/16/23 Appointment Edward Jolly, MD [facility]  Showing future appointments within next 90 days and meeting all other requirements  Disposition: Discharge home  Discharge (Date  Time): 09/12/2023; 0930 hrs.   Primary Care Physician: Sallyanne Kuster, NP Location: [facility] Note by: Edward Jolly, MD Date: 09/12/2023; Time: 10:49 AM  Disclaimer:  Medicine is not an exact science. The only guarantee in medicine is that nothing is guaranteed. It is important to note that the decision to proceed with this intervention was based on the information collected from the patient. The Data and conclusions were drawn from the patient's questionnaire, the interview, and the physical examination. Because the information was provided in large part by the patient, it cannot be guaranteed that it has not been purposely or unconsciously manipulated. Every effort has been made to obtain as much relevant data as possible for this evaluation. It is important to note that the conclusions that lead to this procedure are derived in large part from the available data. Always take into account that the treatment will also be dependent on availability of resources and existing treatment guidelines, considered by other Pain Management Practitioners as being common knowledge and practice, at the time of the intervention. For Medico-Legal purposes, it is also important to point out that variation in procedural techniques and pharmacological choices are the acceptable norm. The indications, contraindications, technique, and results of the above procedure should only be interpreted and judged by a  Board-Certified Interventional Pain Specialist with extensive familiarity and expertise in the same exact procedure and technique.

## 2023-09-12 NOTE — Patient Instructions (Signed)

## 2023-09-13 ENCOUNTER — Telehealth: Payer: Self-pay | Admitting: *Deleted

## 2023-09-13 NOTE — Telephone Encounter (Signed)
 Post procedure call; voicemail left

## 2023-09-16 ENCOUNTER — Other Ambulatory Visit: Payer: Self-pay | Admitting: Nurse Practitioner

## 2023-09-16 DIAGNOSIS — E1165 Type 2 diabetes mellitus with hyperglycemia: Secondary | ICD-10-CM

## 2023-09-17 NOTE — Telephone Encounter (Signed)
 Please review and send

## 2023-10-09 ENCOUNTER — Other Ambulatory Visit: Payer: Self-pay | Admitting: Nurse Practitioner

## 2023-10-09 DIAGNOSIS — I10 Essential (primary) hypertension: Secondary | ICD-10-CM

## 2023-10-11 ENCOUNTER — Ambulatory Visit: Payer: No Typology Code available for payment source | Admitting: Nurse Practitioner

## 2023-10-15 NOTE — Progress Notes (Signed)
 PROVIDER NOTE: Interpretation of information contained herein should be left to medically-trained personnel. Specific patient instructions are provided elsewhere under "Patient Instructions" section of medical record. This document was created in part using AI and STT-dictation technology, any transcriptional errors that may result from this process are unintentional.  Patient: Candace Clayton  Service: E/M   PCP: Laurence Pons, NP  DOB: [birth year]  DOS: 10/16/2023  Provider: Cherylin Corrigan, NP  MRN: [id number]  Delivery: Face-to-face  Specialty: Interventional Pain Management  Type: Established Patient  Setting: Ambulatory outpatient facility  Specialty designation: 09  Referring Prov.: Laurence Pons, NP  Location: Outpatient office facility       HPI  Candace Clayton, a 63 y.o. year old female, is here today because of her Lumbar spondylosis [M47.816]. Ms. Golliday primary complain today is Back Pain and Leg Pain (Right )   Pain Assessment: Severity of Chronic pain is reported as a 5 /10. Location: Back Lower, Right, Left/Radiates from lower back into outer right thigh to the outer right knee. Onset: More than a month ago. Quality: Burning, Numbness (stinging). Timing: Intermittent. Modifying factor(s): Sitting helps some and pain medication. Vitals:  height is 5\' 9"  (1.753 m) and weight is 310 lb (140.6 kg) (abnormal). Her temporal temperature is 97.3 F (36.3 C) (abnormal). Her blood pressure is 125/62 and her pulse is 69. Her respiration is 16 and oxygen saturation is 100%.  BMI: Estimated body mass index is 45.78 kg/m as calculated from the following:   Height as of this encounter: 5\' 9"  (1.753 m).   Weight as of this encounter: 310 lb (140.6 kg). Last encounter: 09/13/2023.  Reason for encounter: medication management.  The patient is doing well with the current medication regimen.  No adverse reaction or side effects reported to the medication.  Prescription drug monitoring (PDMP)  consistent with prescribed medication regimen.  The patient reported 100% pain relief during the first 24 to 48 hours following lumbar radiofrequency ablation (RFA), followed by sustained 80% relief for approximately 4 weeks.  She reports improved function with daily activities and and currently managed with Percocet for additional pain control.  She also reports lower back pain and right leg pain, accompanied pain by a burning sensation, suggestive of possible neuropathic involvement. The patient was advised that the return of the pain may be due to the nerve regeneration following RFA. We will continue to monitor her symptoms and assess the need for further intervention based on clinical progression.   Pharmacotherapy Assessment  Analgesic: Oxycodone -acetaminophen  (Percocet/Roxicet) 5-325 mg every 8 hours as needed for severe pain. MME=22.50 Monitoring: [place] PMP: PDMP reviewed during this encounter.       Pharmacotherapy: No side-effects or adverse reactions reported. Compliance: No problems identified. Effectiveness: Clinically acceptable.  Huston Maiers, RN  10/16/2023  8:19 AM  Sign when Signing Visit Nursing Pain Medication Assessment:  Safety precautions to be maintained throughout the outpatient stay will include: orient to surroundings, keep bed in low position, maintain call bell within reach at all times, provide assistance with transfer out of bed and ambulation.   Medication Inspection Compliance: Pill count conducted under aseptic conditions, in front of the patient. Neither the pills nor the bottle was removed from the patient's sight at any time. Once count was completed pills were immediately returned to the patient in their original bottle.  Medication: Oxycodone /APAP Pill/Patch Count:  22 of 90 pills remain Pill/Patch Appearance: Markings consistent with prescribed medication Bottle Appearance: Standard pharmacy container. Clearly labeled.  Filled Date: 03 / 04 / 2025 Last  Medication intake:  Today       No results found for: "CBDTHCR" No results found for: "D8THCCBX" No results found for: "D9THCCBX"  UDS:  Summary  Date Value Ref Range Status  01/25/2023 Note  Final    Comment:    ==================================================================== [employer] [facility] 13 (MW) ==================================================================== Test                             Result       Flag       Units  Drug Present not Declared for Prescription Verification   Oxazepam                       35           UNEXPECTED ng/mg creat   Temazepam                      54           UNEXPECTED ng/mg creat    Oxazepam and temazepam are expected metabolites of diazepam .    Oxazepam is also an expected metabolite of other benzodiazepine    drugs, including chlordiazepoxide, prazepam, clorazepate, halazepam,    and temazepam.  Oxazepam and temazepam are available as scheduled    prescription medications.  Drug Absent but Declared for Prescription Verification   Oxycodone                       Not Detected UNEXPECTED ng/mg creat ==================================================================== Test                      Result    Flag   Units      Ref Range   Creatinine              69               mg/dL      >=40 ==================================================================== Declared Medications:  The flagging and interpretation on this report are based on the  following declared medications.  Unexpected results may arise from  inaccuracies in the declared medications.   **Note: The testing scope of this panel includes these medications:   Oxycodone   Oxycodone  (Percocet)   **Note: The testing scope of this panel does not include the  following reported medications:   Acetaminophen  (Percocet)  Albuterol  (Ventolin  HFA)  Albuterol  (Duoneb)  Amlodipine  (Norvasc )  Budesonide  (Breztri  Aerosphere)  Buspirone  (Buspar )  Chlorthalidone  (Hygroton )   Clobetasol  (Temovate )  Desvenlafaxine  (Pristiq )  Docusate (Stool Softener)  Fluticasone  (Flonase )  Formoterol  (Breztri  Aerosphere)  Glycopyrrolate (Breztri  Aerosphere)  Hydrochlorothiazide  (Hydrodiuril )  Ipratropium (Duoneb)  Meclizine  (Antivert )  Metformin  (Glucophage )  Methocarbamol  (Robaxin )  Metoprolol  (Toprol )  Minoxidil  (Loniten )  Multivitamin  Ondansetron  (Zofran )  Prednisone   Pregabalin  (Lyrica )  Rosuvastatin  (Crestor )  Semaglutide  (Ozempic )  Supplement  Vitamin D2 (Drisdol ) ==================================================================== For clinical consultation, please call [phone]. ====================================================================       ROS  Constitutional: Denies any fever or chills Gastrointestinal: No reported hemesis, hematochezia, vomiting, or acute GI distress Musculoskeletal: low back pain, right lateral leg pain with burning sensation  Neurological: No reported episodes of acute onset apraxia, aphasia, dysarthria, agnosia, amnesia, paralysis, loss of coordination, or loss of consciousness  Medication Review  Microlet Lancets, Multiple Vitamins-Minerals, OVER THE COUNTER MEDICATION, Semaglutide  (2 MG/DOSE), Vitamin D  (Ergocalciferol ), albuterol , amLODipine , busPIRone , chlorthalidone , cyanocobalamin , desvenlafaxine , fluticasone , glucose  blood, hydrochlorothiazide , ipratropium-albuterol , metFORMIN , methocarbamol , metoprolol  succinate, minoxidil , oxyCODONE , oxyCODONE -acetaminophen , predniSONE , pregabalin , and rosuvastatin   History Review  Allergy: Ms. Mckneely has no known allergies. Drug: Ms. Hayworth  reports no history of drug use. Alcohol:  reports no history of alcohol use. Tobacco:  reports that she has never smoked. She has never used smokeless tobacco. Social: Ms. Archibeque  reports that she has never smoked. She has never used smokeless tobacco. She reports that she does not drink alcohol and does not use drugs. Medical:   has a past medical history of Anginal pain (HCC), Anxiety, Cholecystolithiasis, Depression, Diabetes mellitus without complication (HCC), GERD (gastroesophageal reflux disease), History of C-section (1992), Hypertension, and Sleep apnea. Surgical: Ms. Ohaver  has a past surgical history that includes Cesarean section (1992); Finger surgery (Left, 2011); Hematoma evacuation (Left, 1999); Colonoscopy; Laparoscopic salpingo oophorectomy (Left, 04/02/2018); Colonoscopy with propofol  (N/A, 03/14/2019); and Cholecystectomy (10/26/2020). Family: family history includes Brain cancer in her mother; Breast cancer (age of onset: 27) in her mother; Cancer - Colon in her mother; Cancer - Other in her mother; Colon cancer in her brother and mother; Heart Problems in her brother and father.  Laboratory Chemistry Profile   Renal Lab Results  Component Value Date   BUN 14 12/04/2022   CREATININE 0.83 12/04/2022   BCR 17 12/04/2022   GFRAA 106 02/23/2020   GFRNONAA >60 02/21/2022    Hepatic Lab Results  Component Value Date   AST 12 12/04/2022   ALT 18 12/04/2022   ALBUMIN 4.3 12/04/2022   ALKPHOS 99 12/04/2022   LIPASE 26 02/21/2022    Electrolytes Lab Results  Component Value Date   NA 139 12/04/2022   K 4.8 12/04/2022   CL 102 12/04/2022   CALCIUM  9.4 12/04/2022    Bone Lab Results  Component Value Date   VD25OH 35.6 10/02/2022    Inflammation (CRP: Acute Phase) (ESR: Chronic Phase) Lab Results  Component Value Date   CRP 2 05/24/2021   ESRSEDRATE 12 05/24/2021         Note: Above Lab results reviewed.  Recent Imaging Review  DG PAIN CLINIC C-ARM 1-60 MIN NO REPORT Fluoro was used, but no Radiologist interpretation will be provided.  Please refer to "NOTES" tab for provider progress note. Note: Reviewed        Physical Exam  General appearance: Well nourished, well developed, and well hydrated. In no apparent acute distress Mental status: Alert, oriented x 3 (person, place, &  time)       Respiratory: No evidence of acute respiratory distress Eyes: PERLA Vitals: BP 125/62   Pulse 69   Temp (!) 97.3 F (36.3 C) (Temporal)   Resp 16   Ht 5\' 9"  (1.753 m)   Wt (!) 310 lb (140.6 kg)   SpO2 100%   BMI 45.78 kg/m  BMI: Estimated body mass index is 45.78 kg/m as calculated from the following:   Height as of this encounter: 5\' 9"  (1.753 m).   Weight as of this encounter: 310 lb (140.6 kg). Ideal: Ideal body weight: 66.2 kg (145 lb 15.1 oz) Adjusted ideal body weight: 96 kg (211 lb 9.1 oz)  Assessment   Diagnosis Status  1. Lumbar spondylosis   2. Lumbar facet arthropathy    3. Lumbar degenerative disc disease   4. Chronic pain syndrome   5. Primary osteoarthritis of right knee   6. Medication management   7. Cervical radicular pain (right C5/6)    Controlled Controlled Controlled   Plan  of Care  Assessment and Plan We will continue on the current medication regimen.  Prescribing drug monitoring (PDMP) consistent with prescribed medication regimen.   Pharmacotherapy (Medications Ordered): Meds ordered this encounter  Medications   oxyCODONE -acetaminophen  (PERCOCET/ROXICET) 5-325 MG tablet    Sig: Take 1 tablet by mouth every 8 (eight) hours as needed for severe pain (pain score 7-10).    Dispense:  90 tablet    Refill:  0    For chronic pain syndrome. To last for 30 days from fill date.   oxyCODONE -acetaminophen  (PERCOCET/ROXICET) 5-325 MG tablet    Sig: Take 1 tablet by mouth every 8 (eight) hours as needed for severe pain (pain score 7-10).    Dispense:  90 tablet    Refill:  0    For chronic pain syndrome. To last for 30 days from fill date.   oxyCODONE -acetaminophen  (PERCOCET/ROXICET) 5-325 MG tablet    Sig: Take 1 tablet by mouth every 8 (eight) hours as needed for severe pain (pain score 7-10).    Dispense:  90 tablet    Refill:  0    For chronic pain syndrome. To last for 30 days from fill date.   pregabalin  (LYRICA ) 100 MG capsule     Sig: 100 mg qAM, 200 mg qhs    Dispense:  90 capsule    Refill:  5    Fill one day early if pharmacy is closed on scheduled refill date. May substitute for generic if available.   Orders:  No orders of the defined types were placed in this encounter.  Follow-up plan:   Return in about 3 months (around 01/15/2024) for (F2F), (MM), Marthe Slain NP.    Recent Visits Date Type Provider Dept  09/12/23 Procedure visit Cephus Collin, MD [facility]  07/26/23 Office Visit Cephus Collin, MD [facility]  Showing recent visits within past 90 days and meeting all other requirements Today's Visits Date Type Provider Dept  10/16/23 Office Visit Cephus Collin, MD [facility]  Showing today's visits and meeting all other requirements Future Appointments Date Type Provider Dept  01/08/24 Appointment Quintus Premo K, NP [facility]  Showing future appointments within next 90 days and meeting all other requirements  I discussed the assessment and treatment plan with the patient. The patient was provided an opportunity to ask questions and all were answered. The patient agreed with the plan and demonstrated an understanding of the instructions.  Patient advised to call back or seek an in-person evaluation if the symptoms or condition worsens.  Duration of encounter: 30 minutes.  Total time on encounter, as per AMA guidelines included both the face-to-face and non-face-to-face time personally spent by the physician and/or other qualified health care professional(s) on the day of the encounter (includes time in activities that require the physician or other qualified health care professional and does not include time in activities normally performed by clinical staff). Physician's time may include the following activities when performed: Preparing to see the patient (e.g., pre-charting review of records, searching for previously ordered imaging, lab work, and nerve  conduction tests) Review of prior analgesic pharmacotherapies. Reviewing PMP Interpreting ordered tests (e.g., lab work, imaging, nerve conduction tests) Performing post-procedure evaluations, including interpretation of diagnostic procedures Obtaining and/or reviewing separately obtained history Performing a medically appropriate examination and/or evaluation Counseling and educating the patient/family/caregiver Ordering medications, tests, or procedures Referring and communicating with other health care professionals (when not separately reported) Documenting clinical information in the electronic  or other health record Independently interpreting results (not separately reported) and communicating results to the patient/ family/caregiver Care coordination (not separately reported)  Note by: Angelys Yetman K Treniyah Lynn, NP (TTS and AI technology used. I apologize for any typographical errors that were not detected and corrected.) Date: 10/16/2023; Time: 9:04 AM

## 2023-10-16 ENCOUNTER — Ambulatory Visit
Payer: No Typology Code available for payment source | Attending: Student in an Organized Health Care Education/Training Program | Admitting: Student in an Organized Health Care Education/Training Program

## 2023-10-16 ENCOUNTER — Encounter: Payer: Self-pay | Admitting: Student in an Organized Health Care Education/Training Program

## 2023-10-16 VITALS — BP 125/62 | HR 69 | Temp 97.3°F | Resp 16 | Ht 69.0 in | Wt 310.0 lb

## 2023-10-16 DIAGNOSIS — Z79899 Other long term (current) drug therapy: Secondary | ICD-10-CM | POA: Insufficient documentation

## 2023-10-16 DIAGNOSIS — M51369 Other intervertebral disc degeneration, lumbar region without mention of lumbar back pain or lower extremity pain: Secondary | ICD-10-CM | POA: Diagnosis not present

## 2023-10-16 DIAGNOSIS — G894 Chronic pain syndrome: Secondary | ICD-10-CM | POA: Insufficient documentation

## 2023-10-16 DIAGNOSIS — M1711 Unilateral primary osteoarthritis, right knee: Secondary | ICD-10-CM | POA: Diagnosis present

## 2023-10-16 DIAGNOSIS — M5412 Radiculopathy, cervical region: Secondary | ICD-10-CM | POA: Insufficient documentation

## 2023-10-16 DIAGNOSIS — M47816 Spondylosis without myelopathy or radiculopathy, lumbar region: Secondary | ICD-10-CM | POA: Diagnosis not present

## 2023-10-16 MED ORDER — OXYCODONE-ACETAMINOPHEN 5-325 MG PO TABS: 1.0000 | ORAL_TABLET | Freq: Three times a day (TID) | ORAL | 0 refills | Status: DC | PRN

## 2023-10-16 MED ORDER — PREGABALIN 100 MG PO CAPS: ORAL_CAPSULE | ORAL | 5 refills | Status: DC

## 2023-10-16 NOTE — Progress Notes (Signed)
 Nursing Pain Medication Assessment:  Safety precautions to be maintained throughout the outpatient stay will include: orient to surroundings, keep bed in low position, maintain call bell within reach at all times, provide assistance with transfer out of bed and ambulation.   Medication Inspection Compliance: Pill count conducted under aseptic conditions, in front of the patient. Neither the pills nor the bottle was removed from the patient's sight at any time. Once count was completed pills were immediately returned to the patient in their original bottle.  Medication: Oxycodone /APAP Pill/Patch Count:  22 of 90 pills remain Pill/Patch Appearance: Markings consistent with prescribed medication Bottle Appearance: Standard pharmacy container. Clearly labeled. Filled Date: 03 / 04 / 2025 Last Medication intake:  Today

## 2023-10-17 ENCOUNTER — Ambulatory Visit: Admitting: Nurse Practitioner

## 2023-10-17 VITALS — BP 135/75 | HR 68 | Temp 98.4°F | Resp 16 | Ht 69.0 in | Wt 307.8 lb

## 2023-10-17 DIAGNOSIS — I10 Essential (primary) hypertension: Secondary | ICD-10-CM

## 2023-10-17 DIAGNOSIS — E538 Deficiency of other specified B group vitamins: Secondary | ICD-10-CM | POA: Diagnosis not present

## 2023-10-17 DIAGNOSIS — E1169 Type 2 diabetes mellitus with other specified complication: Secondary | ICD-10-CM | POA: Diagnosis not present

## 2023-10-17 DIAGNOSIS — E782 Mixed hyperlipidemia: Secondary | ICD-10-CM

## 2023-10-17 DIAGNOSIS — I152 Hypertension secondary to endocrine disorders: Secondary | ICD-10-CM

## 2023-10-17 DIAGNOSIS — E1159 Type 2 diabetes mellitus with other circulatory complications: Secondary | ICD-10-CM | POA: Diagnosis not present

## 2023-10-17 DIAGNOSIS — E559 Vitamin D deficiency, unspecified: Secondary | ICD-10-CM

## 2023-10-17 DIAGNOSIS — E1165 Type 2 diabetes mellitus with hyperglycemia: Secondary | ICD-10-CM

## 2023-10-17 DIAGNOSIS — F411 Generalized anxiety disorder: Secondary | ICD-10-CM

## 2023-10-17 DIAGNOSIS — E785 Hyperlipidemia, unspecified: Secondary | ICD-10-CM

## 2023-10-17 LAB — POCT GLYCOSYLATED HEMOGLOBIN (HGB A1C): Hemoglobin A1C: 7.1 % — AB (ref 4.0–5.6)

## 2023-10-17 MED ORDER — HYDROCHLOROTHIAZIDE 25 MG PO TABS: 25.0000 mg | ORAL_TABLET | Freq: Every day | ORAL | 3 refills | Status: DC

## 2023-10-17 MED ORDER — CHLORTHALIDONE 25 MG PO TABS: 25.0000 mg | ORAL_TABLET | Freq: Every day | ORAL | 1 refills | Status: DC

## 2023-10-17 MED ORDER — BUSPIRONE HCL 15 MG PO TABS: 15.0000 mg | ORAL_TABLET | Freq: Three times a day (TID) | ORAL | 1 refills | Status: DC

## 2023-10-17 MED ORDER — VITAMIN D (ERGOCALCIFEROL) 1.25 MG (50000 UNIT) PO CAPS: 50000.0000 [IU] | ORAL_CAPSULE | ORAL | 1 refills | Status: DC

## 2023-10-17 MED ORDER — METFORMIN HCL 500 MG PO TABS: 500.0000 mg | ORAL_TABLET | Freq: Two times a day (BID) | ORAL | 1 refills | Status: DC

## 2023-10-17 NOTE — Progress Notes (Signed)
 [facility] [address] [place], [place] [address]  Internal MEDICINE  Office Visit Note  Patient Name: Candace Clayton  [id number]  [id number]  Date of Service: 10/17/2023  Chief Complaint  Patient presents with   Depression   Diabetes   Gastroesophageal Reflux   Hypertension   Follow-up    HPI Alizzon presents for a follow-up visit for diabetes, hypertension, high cholesterol and weight loss.  Diabetes -- A1c continues to improve down to 7.1 today from 7.3 in December 2024. Is currently on ozempic  2 mg weekly and metformin .  Hypertension -- taking metoprolol , chlorthalidone , hydrochlorothiazide , amlodipine  and minoxidil .  High cholesterol -- taking rosuvastatin  Weight loss -- she has only lost about 3 lbs since January. She is taking metformin  and ozempic .     Current Medication: Outpatient Encounter Medications as of 10/17/2023  Medication Sig   albuterol  (VENTOLIN  HFA) 108 (90 Base) MCG/ACT inhaler TAKE 2 PUFFS BY MOUTH EVERY 6 HOURS AS NEEDED FOR WHEEZE OR SHORTNESS OF BREATH   amLODipine  (NORVASC ) 10 MG tablet TAKE 1 TABLET BY MOUTH EVERY DAY   cyanocobalamin  (VITAMIN B12) 1000 MCG tablet Take 1 tablet (1,000 mcg total) by mouth daily.   desvenlafaxine  (PRISTIQ ) 100 MG 24 hr tablet TAKE 1 TABLET BY MOUTH EVERY DAY   fluticasone  (FLONASE ) 50 MCG/ACT nasal spray Place 2 sprays into both nostrils daily.   glucose blood (CONTOUR NEXT TEST) test strip 1 each by Other route daily. DX E11.65   ipratropium-albuterol  (DUONEB) 0.5-2.5 (3) MG/3ML SOLN Take 3 mLs by nebulization every 4 (four) hours as needed (shortness of breath or wheezing).   methocarbamol  (ROBAXIN ) 750 MG tablet Take 1 tablet (750 mg total) by mouth every 8 (eight) hours as needed for muscle spasms.   metoprolol  succinate (TOPROL -XL) 100 MG 24 hr tablet TAKE 1 TABLET BY MOUTH EVERY DAY WITH OR IMMEDIATELY FOLLOWING A MEAL   Microlet Lancets MISC 1 each by Does not apply route daily. DX-E11.65    minoxidil  (LONITEN ) 10 MG tablet TAKE 1 TABLET BY MOUTH EVERY DAY   Multiple Vitamins-Calcium  (ONE-A-DAY WOMENS PO) Take 1 tablet by mouth daily.   OVER THE COUNTER MEDICATION 1 tablet daily as needed. Takes generic stool softener for constipation d/t oxycodone .    oxyCODONE  (OXY IR/ROXICODONE ) 5 MG immediate release tablet Take by mouth.   oxyCODONE -acetaminophen  (PERCOCET/ROXICET) 5-325 MG tablet Take 1 tablet by mouth every 8 (eight) hours as needed for severe pain (pain score 7-10).   [START ON 11/26/2023] oxyCODONE -acetaminophen  (PERCOCET/ROXICET) 5-325 MG tablet Take 1 tablet by mouth every 8 (eight) hours as needed for severe pain (pain score 7-10).   [START ON 12/26/2023] oxyCODONE -acetaminophen  (PERCOCET/ROXICET) 5-325 MG tablet Take 1 tablet by mouth every 8 (eight) hours as needed for severe pain (pain score 7-10).   pregabalin  (LYRICA ) 100 MG capsule 100 mg qAM, 200 mg qhs   rosuvastatin  (CRESTOR ) 5 MG tablet TAKE 1 TABLET (5 MG TOTAL) BY MOUTH DAILY.   Semaglutide , 2 MG/DOSE, (OZEMPIC , 2 MG/DOSE,) 8 MG/3ML SOPN INJECT 2 MG INTO THE SKIN ONCE A WEEK.   [DISCONTINUED] busPIRone  (BUSPAR ) 15 MG tablet Take 1 tablet (15 mg total) by mouth 3 (three) times daily. For anxiety   [DISCONTINUED] chlorthalidone  (HYGROTON ) 25 MG tablet Take 1 tablet (25 mg total) by mouth daily.   [DISCONTINUED] hydrochlorothiazide  (HYDRODIURIL ) 25 MG tablet TAKE 1 TABLET (25 MG TOTAL) BY MOUTH DAILY.   [DISCONTINUED] metFORMIN  (GLUCOPHAGE ) 500 MG tablet TAKE 1 TABLET BY MOUTH TWICE A DAY WITH FOOD   [  DISCONTINUED] predniSONE  (STERAPRED UNI-PAK 21 TAB) 10 MG (21) TBPK tablet Use as directed for 6 days   [DISCONTINUED] Vitamin D , Ergocalciferol , (DRISDOL ) 1.25 MG (50000 UNIT) CAPS capsule Take 1 capsule (50,000 Units total) by mouth every 7 (seven) days.   busPIRone  (BUSPAR ) 15 MG tablet Take 1 tablet (15 mg total) by mouth 3 (three) times daily. For anxiety   chlorthalidone  (HYGROTON ) 25 MG tablet Take 1 tablet (25 mg  total) by mouth daily.   hydrochlorothiazide  (HYDRODIURIL ) 25 MG tablet Take 1 tablet (25 mg total) by mouth daily.   metFORMIN  (GLUCOPHAGE ) 500 MG tablet Take 1 tablet (500 mg total) by mouth 2 (two) times daily with a meal.   Vitamin D , Ergocalciferol , (DRISDOL ) 1.25 MG (50000 UNIT) CAPS capsule Take 1 capsule (50,000 Units total) by mouth every 7 (seven) days.   No facility-administered encounter medications on file as of 10/17/2023.    Surgical History: Past Surgical History:  Procedure Laterality Date   CESAREAN SECTION  1992   CHOLECYSTECTOMY  10/26/2020   COLONOSCOPY     polyps removed first procedure. 2nd time all was clear   COLONOSCOPY WITH PROPOFOL  N/A 03/14/2019   Procedure: COLONOSCOPY WITH PROPOFOL ;  Surgeon: Selena Daily, MD;  Location: [facility] END[facility]COPY;  Service: Gastroenterology;  Laterality: N/A;   FINGER SURGERY Left 2011   left finger cut off x 2.(only up to last digit)   HEMATOMA EVACUATION Left 1999   upper part of foot was injured d/t 500lb weight landing on her foot.    LAPAROSCOPIC SALPINGO OOPHERECTOMY Left 04/02/2018   Procedure: LAPAROSCOPIC SALPINGO OOPHORECTOMY;  Surgeon: Darl Edu, MD;  Location: [facility] ORS;  Service: Gynecology;  Laterality: Left;    Medical History: Past Medical History:  Diagnosis Date   Anginal pain (HCC)    tightness related to anxiety   Anxiety    Cholecystolithiasis    Depression    Diabetes mellitus without complication (HCC)    GERD (gastroesophageal reflux disease)    throws up easily but not diagnosed with reflux   History of C-section 1992   Hypertension    Sleep apnea    uses cpap    Family History: Family History  Problem Relation Age of Onset   Breast cancer Mother 40       x 3 times   Colon cancer Mother    Brain cancer Mother    Cancer - Colon Mother    Cancer - Other Mother    Heart Problems Father    Colon cancer Brother    Heart Problems Brother     Social History   Socioeconomic  History   Marital status: Widowed    Spouse name: Not on file   Number of children: 1   Years of education: Not on file   Highest education level: Not on file  Occupational History   Occupation: works in Programme researcher, broadcasting/film/video  Tobacco Use   Smoking status: Never   Smokeless tobacco: Never  Vaping Use   Vaping status: Never Used  Substance and Sexual Activity   Alcohol use: No   Drug use: No   Sexual activity: Not Currently    Birth control/protection: Post-menopausal  Other Topics Concern   Not on file  Social History Narrative   Son has schizophrenia and autism but is fully capable of helping mother after surgery   Social Drivers of Corporate investment banker Strain: Not on file  Food Insecurity: Not on file  Transportation Needs: Not on file  Physical Activity: Not on file  Stress: Not on file  Social Connections: Not on file  Intimate Partner Violence: Not on file      Review of Systems  Constitutional:  Positive for activity change (low energy) and fatigue. Negative for chills and unexpected weight change.  HENT:  Negative for congestion, sneezing and sore throat.   Respiratory: Negative.  Negative for cough, chest tightness, shortness of breath and wheezing.   Cardiovascular: Negative.  Negative for chest pain and palpitations.  Gastrointestinal:  Negative for abdominal pain, constipation, diarrhea, nausea and vomiting.  Musculoskeletal:  Positive for arthralgias and back pain. Negative for gait problem and neck pain.  Skin:  Negative for rash.  Psychiatric/Behavioral:  Negative for behavioral problems (Depression), sleep disturbance and suicidal ideas. The patient is not nervous/anxious.     Vital Signs: BP 135/75   Pulse 68   Temp 98.4 F (36.9 C)   Resp 16   Ht 5\' 9"  (1.753 m)   Wt (!) 307 lb 12.8 oz (139.6 kg)   SpO2 97%   BMI 45.45 kg/m    Physical Exam Vitals reviewed.  Constitutional:      General: She is not in acute distress.    Appearance:  Normal appearance. She is obese. She is not ill-appearing.  HENT:     Head: Normocephalic and atraumatic.  Eyes:     Pupils: Pupils are equal, round, and reactive to light.  Cardiovascular:     Rate and Rhythm: Normal rate and regular rhythm.  Pulmonary:     Effort: Pulmonary effort is normal. No respiratory distress.  Neurological:     Mental Status: She is alert and oriented to person, place, and time.  Psychiatric:        Mood and Affect: Mood normal.        Behavior: Behavior normal.        Assessment/Plan: 1. Type 2 diabetes mellitus with other specified complication, without long-term current use of insulin (HCC) (Primary) A1c is still slightly elevated but improving. Repeat A1c in 4 months. Routine labs ordered. Continue metformin  and ozempic  as prescribed.  - POCT glycosylated hemoglobin (Hb A1C) - metFORMIN  (GLUCOPHAGE ) 500 MG tablet; Take 1 tablet (500 mg total) by mouth 2 (two) times daily with a meal.  Dispense: 180 tablet; Refill: 1 - CBC with Differential/Platelet - CMP14+EGFR - Lipid Profile - Vitamin D  (25 hydroxy) - B12 and Folate Panel  2. Hypertension associated with type 2 diabetes mellitus (HCC) For now continue all medications as prescribed. Will work on consolidating her BP medications. Routine labs ordered.  - hydrochlorothiazide  (HYDRODIURIL ) 25 MG tablet; Take 1 tablet (25 mg total) by mouth daily.  Dispense: 90 tablet; Refill: 3 - chlorthalidone  (HYGROTON ) 25 MG tablet; Take 1 tablet (25 mg total) by mouth daily.  Dispense: 90 tablet; Refill: 1 - CBC with Differential/Platelet - CMP14+EGFR - Lipid Profile - Vitamin D  (25 hydroxy) - B12 and Folate Panel  3. Hyperlipidemia associated with type 2 diabetes mellitus (HCC) Routine labs ordered. Continue rosuvastatin  as prescribed.  - CBC with Differential/Platelet - CMP14+EGFR - Lipid Profile  4. B12 deficiency Routine labs ordered  - CBC with Differential/Platelet - B12 and Folate Panel  5.  Vitamin D  deficiency Routine lab ordered. Continue weekly vitamin D  supplement.  - Vitamin D , Ergocalciferol , (DRISDOL ) 1.25 MG (50000 UNIT) CAPS capsule; Take 1 capsule (50,000 Units total) by mouth every 7 (seven) days.  Dispense: 4 capsule; Refill: 1 - Vitamin D  (25 hydroxy)  6. GAD (  generalized anxiety disorder) Continue buspirone  as prescribed  - busPIRone  (BUSPAR ) 15 MG tablet; Take 1 tablet (15 mg total) by mouth 3 (three) times daily. For anxiety  Dispense: 270 tablet; Refill: 1   General Counseling: Rose verbalizes understanding of the findings of todays visit and agrees with plan of treatment. I have discussed any further diagnostic evaluation that may be needed or ordered today. We also reviewed her medications today. she has been encouraged to call the office with any questions or concerns that should arise related to todays visit.    Orders Placed This Encounter  Procedures   CBC with Differential/Platelet   CMP14+EGFR   Lipid Profile   Vitamin D  (25 hydroxy)   B12 and Folate Panel   POCT glycosylated hemoglobin (Hb A1C)    Meds ordered this encounter  Medications   metFORMIN  (GLUCOPHAGE ) 500 MG tablet    Sig: Take 1 tablet (500 mg total) by mouth 2 (two) times daily with a meal.    Dispense:  180 tablet    Refill:  1   hydrochlorothiazide  (HYDRODIURIL ) 25 MG tablet    Sig: Take 1 tablet (25 mg total) by mouth daily.    Dispense:  90 tablet    Refill:  3   chlorthalidone  (HYGROTON ) 25 MG tablet    Sig: Take 1 tablet (25 mg total) by mouth daily.    Dispense:  90 tablet    Refill:  1   busPIRone  (BUSPAR ) 15 MG tablet    Sig: Take 1 tablet (15 mg total) by mouth 3 (three) times daily. For anxiety    Dispense:  270 tablet    Refill:  1   Vitamin D , Ergocalciferol , (DRISDOL ) 1.25 MG (50000 UNIT) CAPS capsule    Sig: Take 1 capsule (50,000 Units total) by mouth every 7 (seven) days.    Dispense:  4 capsule    Refill:  1    Return in about 4 months (around  02/16/2024) for F/U, Recheck A1C, [place] Latka PCP.   Total time spent:30 Minutes Time spent includes review of chart, medications, test results, and follow up plan with the patient.   [place] Controlled Substance Database was reviewed by me.  This patient was seen by Laurence Pons, FNP-C in collaboration with Dr. Verneta Gone as a part of collaborative care agreement.   Katerine Morua R. Bobbi Burow, MSN, FNP-C Internal medicine

## 2023-11-18 ENCOUNTER — Encounter: Payer: Self-pay | Admitting: Nurse Practitioner

## 2023-11-30 LAB — LIPID PANEL

## 2023-12-03 ENCOUNTER — Telehealth: Payer: Self-pay | Admitting: Nurse Practitioner

## 2023-12-03 NOTE — Telephone Encounter (Signed)
 Left vm to confirm 12/10/23 appointment-Toni

## 2023-12-10 ENCOUNTER — Encounter: Payer: Self-pay | Admitting: Nurse Practitioner

## 2023-12-10 ENCOUNTER — Telehealth: Payer: Self-pay | Admitting: Nurse Practitioner

## 2023-12-10 ENCOUNTER — Ambulatory Visit (INDEPENDENT_AMBULATORY_CARE_PROVIDER_SITE_OTHER): Payer: No Typology Code available for payment source | Admitting: Nurse Practitioner

## 2023-12-10 VITALS — BP 128/75 | HR 68 | Temp 98.2°F | Resp 16 | Ht 69.0 in | Wt 307.0 lb

## 2023-12-10 DIAGNOSIS — Z1211 Encounter for screening for malignant neoplasm of colon: Secondary | ICD-10-CM

## 2023-12-10 DIAGNOSIS — E1159 Type 2 diabetes mellitus with other circulatory complications: Secondary | ICD-10-CM

## 2023-12-10 DIAGNOSIS — F411 Generalized anxiety disorder: Secondary | ICD-10-CM

## 2023-12-10 DIAGNOSIS — R1903 Right lower quadrant abdominal swelling, mass and lump: Secondary | ICD-10-CM | POA: Diagnosis not present

## 2023-12-10 DIAGNOSIS — Z0001 Encounter for general adult medical examination with abnormal findings: Secondary | ICD-10-CM

## 2023-12-10 DIAGNOSIS — Z1212 Encounter for screening for malignant neoplasm of rectum: Secondary | ICD-10-CM

## 2023-12-10 DIAGNOSIS — E785 Hyperlipidemia, unspecified: Secondary | ICD-10-CM

## 2023-12-10 DIAGNOSIS — J42 Unspecified chronic bronchitis: Secondary | ICD-10-CM

## 2023-12-10 DIAGNOSIS — I152 Hypertension secondary to endocrine disorders: Secondary | ICD-10-CM

## 2023-12-10 DIAGNOSIS — Z1231 Encounter for screening mammogram for malignant neoplasm of breast: Secondary | ICD-10-CM

## 2023-12-10 DIAGNOSIS — E1169 Type 2 diabetes mellitus with other specified complication: Secondary | ICD-10-CM | POA: Diagnosis not present

## 2023-12-10 LAB — CMP14+EGFR
ALT: 12 IU/L (ref 0–32)
AST: 14 IU/L (ref 0–40)
Albumin: 4.2 g/dL (ref 3.9–4.9)
Alkaline Phosphatase: 85 IU/L (ref 44–121)
BUN/Creatinine Ratio: 16 (ref 12–28)
BUN: 14 mg/dL (ref 8–27)
Bilirubin Total: 0.3 mg/dL (ref 0.0–1.2)
CO2: 22 mmol/L (ref 20–29)
Calcium: 9.3 mg/dL (ref 8.7–10.3)
Chloride: 101 mmol/L (ref 96–106)
Creatinine, Ser: 0.85 mg/dL (ref 0.57–1.00)
Globulin, Total: 2.2 g/dL (ref 1.5–4.5)
Glucose: 123 mg/dL — ABNORMAL HIGH (ref 70–99)
Potassium: 4.3 mmol/L (ref 3.5–5.2)
Sodium: 140 mmol/L (ref 134–144)
Total Protein: 6.4 g/dL (ref 6.0–8.5)
eGFR: 77 mL/min/{1.73_m2} (ref >59)

## 2023-12-10 LAB — LIPID PANEL
Chol/HDL Ratio: 3 ratio (ref 0.0–4.4)
Cholesterol, Total: 148 mg/dL (ref 100–199)
HDL: 50 mg/dL (ref >39)
LDL Chol Calc (NIH): 68 mg/dL (ref 0–99)
Triglycerides: 179 mg/dL — ABNORMAL HIGH (ref 0–149)
VLDL Cholesterol Cal: 30 mg/dL (ref 5–40)

## 2023-12-10 LAB — CBC WITH DIFFERENTIAL/PLATELET
Basophils Absolute: 0.1 10*3/uL (ref 0.0–0.2)
Basos: 1 %
EOS (ABSOLUTE): 0 10*3/uL (ref 0.0–0.4)
Eos: 0 %
Hematocrit: 38.4 % (ref 34.0–46.6)
Hemoglobin: 12.6 g/dL (ref 11.1–15.9)
Immature Grans (Abs): 0 10*3/uL (ref 0.0–0.1)
Immature Granulocytes: 0 %
Lymphocytes Absolute: 1.5 10*3/uL (ref 0.7–3.1)
Lymphs: 24 %
MCH: 28.9 pg (ref 26.6–33.0)
MCHC: 32.8 g/dL (ref 31.5–35.7)
MCV: 88 fL (ref 79–97)
Monocytes Absolute: 0.6 10*3/uL (ref 0.1–0.9)
Monocytes: 9 %
Neutrophils Absolute: 4.1 10*3/uL (ref 1.4–7.0)
Neutrophils: 66 %
Platelets: 221 10*3/uL (ref 150–450)
RBC: 4.36 x10E6/uL (ref 3.77–5.28)
RDW: 13.4 % (ref 11.7–15.4)
WBC: 6.3 10*3/uL (ref 3.4–10.8)

## 2023-12-10 LAB — VITAMIN D 25 HYDROXY (VIT D DEFICIENCY, FRACTURES): Vit D, 25-Hydroxy: 34.7 ng/mL (ref 30.0–100.0)

## 2023-12-10 LAB — B12 AND FOLATE PANEL

## 2023-12-10 MED ORDER — DESVENLAFAXINE SUCCINATE ER 100 MG PO TB24: 100.0000 mg | ORAL_TABLET | Freq: Every day | ORAL | 1 refills | Status: DC

## 2023-12-10 MED ORDER — MINOXIDIL 10 MG PO TABS: 10.0000 mg | ORAL_TABLET | Freq: Every day | ORAL | 2 refills | Status: DC

## 2023-12-10 MED ORDER — ROSUVASTATIN CALCIUM 5 MG PO TABS: 5.0000 mg | ORAL_TABLET | Freq: Every day | ORAL | 1 refills | Status: DC

## 2023-12-10 MED ORDER — BREZTRI AEROSPHERE 160-9-4.8 MCG/ACT IN AERO: 2.0000 | INHALATION_SPRAY | Freq: Two times a day (BID) | RESPIRATORY_TRACT | 11 refills | Status: DC

## 2023-12-10 MED ORDER — AMLODIPINE BESYLATE 10 MG PO TABS: 10.0000 mg | ORAL_TABLET | Freq: Every day | ORAL | 1 refills | Status: DC

## 2023-12-10 MED ORDER — METFORMIN HCL 500 MG PO TABS: 500.0000 mg | ORAL_TABLET | Freq: Two times a day (BID) | ORAL | 1 refills | Status: DC

## 2023-12-10 MED ORDER — IPRATROPIUM-ALBUTEROL 0.5-2.5 (3) MG/3ML IN SOLN
3.0000 mL | RESPIRATORY_TRACT | 3 refills | Status: DC | PRN
Start: 2023-12-10 — End: 2024-03-13

## 2023-12-10 MED ORDER — BUSPIRONE HCL 15 MG PO TABS: 15.0000 mg | ORAL_TABLET | Freq: Three times a day (TID) | ORAL | 1 refills | Status: DC

## 2023-12-10 MED ORDER — ALBUTEROL SULFATE HFA 108 (90 BASE) MCG/ACT IN AERS
INHALATION_SPRAY | RESPIRATORY_TRACT | 3 refills | Status: DC
Start: 2023-12-10 — End: 2024-03-13

## 2023-12-10 MED ORDER — METOPROLOL SUCCINATE ER 100 MG PO TB24: ORAL_TABLET | ORAL | 1 refills | Status: DC

## 2023-12-10 NOTE — Telephone Encounter (Signed)
 Awaiting 12/10/23 office notes for GI referral-Toni

## 2023-12-10 NOTE — Progress Notes (Unsigned)
 [facility] [address] [place], [place] [address]  Internal MEDICINE  Office Visit Note  Patient Name: Candace Clayton  [id number]  [id number]  Date of Service: 12/10/2023  Chief Complaint  Patient presents with   Annual Exam   Depression   Diabetes   Gastroesophageal Reflux   Hypertension    HPI Lorella presents for an annual well visit and physical exam.  Well-appearing 63 y.o. female with hypertension, allergic rhinitis, OSA, lumbar spondylosis, overactive bladder, chronic pain, insomnia, GAD, high cholesterol, and depression  Routine CRC screening: due now, needs referral to GI Routine mammogram: due now  DEXA scan: due in 2 years  Pap smear: due for pap smear, goes to [facility] exam: patient will call to schedule with [facility]  foot exam: done Labs: triglycerides are still elevated but improving, the rest of the cholesterol panel is normal B12 and folate are still pending.  New or worsening pain: chronic pain, sees a pain medicine specialist.  Other concerns: none     Current Medication: Outpatient Encounter Medications as of 12/10/2023  Medication Sig   budesonide -glycopyrrolate-formoterol  (BREZTRI  AEROSPHERE) 160-9-4.8 MCG/ACT AERO inhaler Inhale 2 puffs into the lungs 2 (two) times daily.   albuterol  (VENTOLIN  HFA) 108 (90 Base) MCG/ACT inhaler TAKE 2 PUFFS BY MOUTH EVERY 6 HOURS AS NEEDED FOR WHEEZE OR SHORTNESS OF BREATH   amLODipine  (NORVASC ) 10 MG tablet Take 1 tablet (10 mg total) by mouth daily.   busPIRone  (BUSPAR ) 15 MG tablet Take 1 tablet (15 mg total) by mouth 3 (three) times daily. For anxiety   chlorthalidone  (HYGROTON ) 25 MG tablet Take 1 tablet (25 mg total) by mouth daily.   cyanocobalamin  (VITAMIN B12) 1000 MCG tablet Take 1 tablet (1,000 mcg total) by mouth daily.   desvenlafaxine  (PRISTIQ ) 100 MG 24 hr tablet Take 1 tablet (100 mg total) by mouth daily.   fluticasone  (FLONASE ) 50 MCG/ACT nasal spray Place 2 sprays into  both nostrils daily.   glucose blood (CONTOUR NEXT TEST) test strip 1 each by Other route daily. DX E11.65   hydrochlorothiazide  (HYDRODIURIL ) 25 MG tablet Take 1 tablet (25 mg total) by mouth daily.   ipratropium-albuterol  (DUONEB) 0.5-2.5 (3) MG/3ML SOLN Take 3 mLs by nebulization every 4 (four) hours as needed (shortness of breath or wheezing).   metFORMIN  (GLUCOPHAGE ) 500 MG tablet Take 1 tablet (500 mg total) by mouth 2 (two) times daily with a meal.   methocarbamol  (ROBAXIN ) 750 MG tablet Take 1 tablet (750 mg total) by mouth every 8 (eight) hours as needed for muscle spasms.   metoprolol  succinate (TOPROL -XL) 100 MG 24 hr tablet TAKE 1 TABLET BY MOUTH EVERY DAY WITH OR IMMEDIATELY FOLLOWING A MEAL   Microlet Lancets MISC 1 each by Does not apply route daily. DX-E11.65   minoxidil  (LONITEN ) 10 MG tablet Take 1 tablet (10 mg total) by mouth daily.   Multiple Vitamins-Calcium  (ONE-A-DAY WOMENS PO) Take 1 tablet by mouth daily.   OVER THE COUNTER MEDICATION 1 tablet daily as needed. Takes generic stool softener for constipation d/t oxycodone .    oxyCODONE  (OXY IR/ROXICODONE ) 5 MG immediate release tablet Take by mouth.   oxyCODONE -acetaminophen  (PERCOCET/ROXICET) 5-325 MG tablet Take 1 tablet by mouth every 8 (eight) hours as needed for severe pain (pain score 7-10).   oxyCODONE -acetaminophen  (PERCOCET/ROXICET) 5-325 MG tablet Take 1 tablet by mouth every 8 (eight) hours as needed for severe pain (pain score 7-10).   [START ON 12/26/2023] oxyCODONE -acetaminophen  (PERCOCET/ROXICET) 5-325 MG tablet Take  1 tablet by mouth every 8 (eight) hours as needed for severe pain (pain score 7-10).   pregabalin  (LYRICA ) 100 MG capsule 100 mg qAM, 200 mg qhs   rosuvastatin  (CRESTOR ) 5 MG tablet Take 1 tablet (5 mg total) by mouth daily.   Semaglutide , 2 MG/DOSE, (OZEMPIC , 2 MG/DOSE,) 8 MG/3ML SOPN INJECT 2 MG INTO THE SKIN ONCE A WEEK.   Vitamin D , Ergocalciferol , (DRISDOL ) 1.25 MG (50000 UNIT) CAPS capsule Take  1 capsule (50,000 Units total) by mouth every 7 (seven) days.   [DISCONTINUED] albuterol  (VENTOLIN  HFA) 108 (90 Base) MCG/ACT inhaler TAKE 2 PUFFS BY MOUTH EVERY 6 HOURS AS NEEDED FOR WHEEZE OR SHORTNESS OF BREATH   [DISCONTINUED] amLODipine  (NORVASC ) 10 MG tablet TAKE 1 TABLET BY MOUTH EVERY DAY   [DISCONTINUED] busPIRone  (BUSPAR ) 15 MG tablet Take 1 tablet (15 mg total) by mouth 3 (three) times daily. For anxiety   [DISCONTINUED] desvenlafaxine  (PRISTIQ ) 100 MG 24 hr tablet TAKE 1 TABLET BY MOUTH EVERY DAY   [DISCONTINUED] ipratropium-albuterol  (DUONEB) 0.5-2.5 (3) MG/3ML SOLN Take 3 mLs by nebulization every 4 (four) hours as needed (shortness of breath or wheezing).   [DISCONTINUED] metFORMIN  (GLUCOPHAGE ) 500 MG tablet Take 1 tablet (500 mg total) by mouth 2 (two) times daily with a meal.   [DISCONTINUED] metoprolol  succinate (TOPROL -XL) 100 MG 24 hr tablet TAKE 1 TABLET BY MOUTH EVERY DAY WITH OR IMMEDIATELY FOLLOWING A MEAL   [DISCONTINUED] minoxidil  (LONITEN ) 10 MG tablet TAKE 1 TABLET BY MOUTH EVERY DAY   [DISCONTINUED] rosuvastatin  (CRESTOR ) 5 MG tablet TAKE 1 TABLET (5 MG TOTAL) BY MOUTH DAILY.   No facility-administered encounter medications on file as of 12/10/2023.    Surgical History: Past Surgical History:  Procedure Laterality Date   CESAREAN SECTION  1992   CHOLECYSTECTOMY  10/26/2020   COLONOSCOPY     polyps removed first procedure. 2nd time all was clear   COLONOSCOPY WITH PROPOFOL  N/A 03/14/2019   Procedure: COLONOSCOPY WITH PROPOFOL ;  Surgeon: Selena Daily, MD;  Location: [facility] ENDOSCOPY;  Service: Gastroenterology;  Laterality: N/A;   FINGER SURGERY Left 2011   left finger cut off x 2.(only up to last digit)   HEMATOMA EVACUATION Left 1999   upper part of foot was injured d/t 500lb weight landing on her foot.    LAPAROSCOPIC SALPINGO OOPHERECTOMY Left 04/02/2018   Procedure: LAPAROSCOPIC SALPINGO OOPHORECTOMY;  Surgeon: Darl Edu, MD;  Location: [facility] ORS;   Service: Gynecology;  Laterality: Left;    Medical History: Past Medical History:  Diagnosis Date   Anginal pain (HCC)    tightness related to anxiety   Anxiety    Cholecystolithiasis    Depression    Diabetes mellitus without complication (HCC)    GERD (gastroesophageal reflux disease)    throws up easily but not diagnosed with reflux   History of C-section 1992   Hypertension    Sleep apnea    uses cpap    Family History: Family History  Problem Relation Age of Onset   Breast cancer Mother 40       x 3 times   Colon cancer Mother    Brain cancer Mother    Cancer - Colon Mother    Cancer - Other Mother    Heart Problems Father    Colon cancer Brother    Heart Problems Brother     Social History   Socioeconomic History   Marital status: Widowed    Spouse name: Not on file   Number of  children: 1   Years of education: Not on file   Highest education level: Not on file  Occupational History   Occupation: works in Programme researcher, broadcasting/film/video  Tobacco Use   Smoking status: Never   Smokeless tobacco: Never  Vaping Use   Vaping status: Never Used  Substance and Sexual Activity   Alcohol use: No   Drug use: No   Sexual activity: Not Currently    Birth control/protection: Post-menopausal  Other Topics Concern   Not on file  Social History Narrative   Son has schizophrenia and autism but is fully capable of helping mother after surgery   Social Drivers of Corporate investment banker Strain: Not on file  Food Insecurity: Not on file  Transportation Needs: Not on file  Physical Activity: Not on file  Stress: Not on file  Social Connections: Not on file  Intimate Partner Violence: Not on file      Review of Systems  Constitutional:  Negative for activity change, appetite change, chills, fatigue, fever and unexpected weight change.  HENT: Negative.  Negative for congestion, ear pain, rhinorrhea, sore throat and trouble swallowing.   Eyes: Negative.   Respiratory:   Positive for shortness of breath (intermittent). Negative for cough, chest tightness and wheezing.   Cardiovascular: Negative.  Negative for chest pain and palpitations.  Gastrointestinal: Negative.  Negative for abdominal pain, blood in stool, constipation, diarrhea, nausea and vomiting.  Endocrine: Negative.   Genitourinary: Negative.  Negative for difficulty urinating, dysuria, frequency, hematuria and urgency.  Musculoskeletal: Negative.  Negative for arthralgias, back pain, joint swelling, myalgias and neck pain.  Skin: Negative.  Negative for rash and wound.  Allergic/Immunologic: Negative.  Negative for immunocompromised state.  Neurological: Negative.  Negative for dizziness, seizures, numbness and headaches.  Hematological: Negative.   Psychiatric/Behavioral:  Negative for behavioral problems, self-injury and suicidal ideas. The patient is not nervous/anxious.     Vital Signs: BP 128/75   Pulse 68   Temp 98.2 F (36.8 C)   Resp 16   Ht 5' 9 (1.753 m)   Wt (!) 307 lb (139.3 kg)   SpO2 96%   BMI 45.34 kg/m    Physical Exam Vitals reviewed.  Constitutional:      General: She is awake. She is not in acute distress.    Appearance: Normal appearance. She is well-developed and well-groomed. She is morbidly obese. She is not ill-appearing or diaphoretic.  HENT:     Head: Normocephalic and atraumatic.     Right Ear: Tympanic membrane, ear canal and external ear normal.     Left Ear: Tympanic membrane, ear canal and external ear normal.     Nose: Nose normal. No congestion or rhinorrhea.     Mouth/Throat:     Lips: Pink.     Mouth: Mucous membranes are moist.     Pharynx: Oropharynx is clear. Uvula midline. No oropharyngeal exudate or posterior oropharyngeal erythema.   Eyes:     General: Lids are normal. Vision grossly intact. Gaze aligned appropriately. No scleral icterus.       Right eye: No discharge.        Left eye: No discharge.     Extraocular Movements:  Extraocular movements intact.     Conjunctiva/sclera: Conjunctivae normal.     Pupils: Pupils are equal, round, and reactive to light.     Funduscopic exam:    Right eye: Red reflex present.        Left eye: Red reflex present.  Neck:     Thyroid : No thyromegaly.     Vascular: No JVD.     Trachea: Trachea and phonation normal. No tracheal deviation.   Cardiovascular:     Rate and Rhythm: Normal rate and regular rhythm.     Pulses:          Carotid pulses are 3+ on the right side and 3+ on the left side.      Radial pulses are 2+ on the right side and 2+ on the left side.       Dorsalis pedis pulses are 2+ on the right side and 2+ on the left side.       Posterior tibial pulses are 1+ on the right side and 1+ on the left side.     Heart sounds: Normal heart sounds, S1 normal and S2 normal. No murmur heard.    No friction rub. No gallop.  Pulmonary:     Effort: Pulmonary effort is normal. No accessory muscle usage or respiratory distress.     Breath sounds: Normal breath sounds and air entry. No stridor. No wheezing or rales.  Chest:     Chest wall: No tenderness.     Comments: Declined clinical breast exam, patient had a normal mammogram in march this year.  Abdominal:     General: Bowel sounds are normal. There is no distension.     Palpations: Abdomen is soft. There is no shifting dullness, fluid wave, mass or pulsatile mass.     Tenderness: There is no abdominal tenderness. There is no guarding or rebound.   Musculoskeletal:        General: No tenderness or deformity.     Cervical back: Normal range of motion and neck supple.     Right lower leg: 1+ Pitting Edema present.     Left lower leg: 1+ Pitting Edema present.     Right foot: Decreased range of motion. No deformity, bunion, Charcot foot, foot drop or prominent metatarsal heads.     Left foot: Decreased range of motion. No deformity, bunion, Charcot foot, foot drop or prominent metatarsal heads.  Feet:     Right foot:      Protective Sensation: 6 sites tested.  6 sites sensed.     Skin integrity: Callus and dry skin present. No ulcer, blister, skin breakdown, erythema, warmth or fissure.     Toenail Condition: Right toenails are abnormally thick and long. Fungal disease present.    Left foot:     Protective Sensation: 6 sites tested.  6 sites sensed.     Skin integrity: Callus and dry skin present. No ulcer, blister, skin breakdown, warmth or fissure.     Toenail Condition: Left toenails are abnormally thick and long. Fungal disease present. Lymphadenopathy:     Cervical: No cervical adenopathy.   Skin:    General: Skin is warm and dry.     Capillary Refill: Capillary refill takes less than 2 seconds.     Coloration: Skin is not pale.     Findings: No erythema or rash.   Neurological:     Mental Status: She is alert and oriented to person, place, and time.     Cranial Nerves: No cranial nerve deficit.     Motor: No abnormal muscle tone.     Coordination: Coordination normal.     Gait: Gait normal.     Deep Tendon Reflexes: Reflexes are normal and symmetric.   Psychiatric:        Mood  and Affect: Mood and affect normal.        Behavior: Behavior normal. Behavior is cooperative.        Thought Content: Thought content normal.        Judgment: Judgment normal.        Assessment/Plan: 1. Encounter for routine adult health examination with abnormal findings (Primary) Age-appropriate preventive screenings and vaccinations discussed, annual physical exam completed. Routine labs for health maintenance results reviewed with patient. PHM updated.   - minoxidil  (LONITEN ) 10 MG tablet; Take 1 tablet (10 mg total) by mouth daily.  Dispense: 90 tablet; Refill: 2 - desvenlafaxine  (PRISTIQ ) 100 MG 24 hr tablet; Take 1 tablet (100 mg total) by mouth daily.  Dispense: 90 tablet; Refill: 1  2. Type 2 diabetes mellitus with other specified complication, without long-term current use of insulin (HCC) Stable,  continue metformin  as prescribed.  - metFORMIN  (GLUCOPHAGE ) 500 MG tablet; Take 1 tablet (500 mg total) by mouth 2 (two) times daily with a meal.  Dispense: 180 tablet; Refill: 1  3. Hypertension associated with type 2 diabetes mellitus (HCC) Stable, continue medications as prescribed. - minoxidil  (LONITEN ) 10 MG tablet; Take 1 tablet (10 mg total) by mouth daily.  Dispense: 90 tablet; Refill: 2 - metoprolol  succinate (TOPROL -XL) 100 MG 24 hr tablet; TAKE 1 TABLET BY MOUTH EVERY DAY WITH OR IMMEDIATELY FOLLOWING A MEAL  Dispense: 90 tablet; Refill: 1 - amLODipine  (NORVASC ) 10 MG tablet; Take 1 tablet (10 mg total) by mouth daily.  Dispense: 90 tablet; Refill: 1  4. Hyperlipidemia associated with type 2 diabetes mellitus (HCC) Continue rosuvastatin  as prescribed.  - rosuvastatin  (CRESTOR ) 5 MG tablet; Take 1 tablet (5 mg total) by mouth daily.  Dispense: 90 tablet; Refill: 1  5. Chronic bronchitis, unspecified chronic bronchitis type (HCC) PFT ordered, continue breztri  twice daily and continue prn albuterol  and duoneb as prescribed.  - ipratropium-albuterol  (DUONEB) 0.5-2.5 (3) MG/3ML SOLN; Take 3 mLs by nebulization every 4 (four) hours as needed (shortness of breath or wheezing).  Dispense: 360 mL; Refill: 3 - albuterol  (VENTOLIN  HFA) 108 (90 Base) MCG/ACT inhaler; TAKE 2 PUFFS BY MOUTH EVERY 6 HOURS AS NEEDED FOR WHEEZE OR SHORTNESS OF BREATH  Dispense: 18 g; Refill: 3 - budesonide -glycopyrrolate-formoterol  (BREZTRI  AEROSPHERE) 160-9-4.8 MCG/ACT AERO inhaler; Inhale 2 puffs into the lungs 2 (two) times daily.  Dispense: 10.7 g; Refill: 11 - Pulmonary function test; Future  6. Abdominal mass, right lower quadrant Ultrasound ordered for further evaluation  - US  pelvis Limited; Future  7. Screening for colorectal cancer Referred to GI  - Ambulatory referral to Gastroenterology  8. Encounter for screening mammogram for malignant neoplasm of breast Routine mammogram ordered  - MM 3D  SCREENING MAMMOGRAM BILATERAL BREAST; Future  9. GAD (generalized anxiety disorder) Continue buspirone  and desvenlafaxine  as prescribed.  - desvenlafaxine  (PRISTIQ ) 100 MG 24 hr tablet; Take 1 tablet (100 mg total) by mouth daily.  Dispense: 90 tablet; Refill: 1 - busPIRone  (BUSPAR ) 15 MG tablet; Take 1 tablet (15 mg total) by mouth 3 (three) times daily. For anxiety  Dispense: 270 tablet; Refill: 1     General Counseling: Regene verbalizes understanding of the findings of todays visit and agrees with plan of treatment. I have discussed any further diagnostic evaluation that may be needed or ordered today. We also reviewed her medications today. she has been encouraged to call the office with any questions or concerns that should arise related to todays visit.    Orders Placed This Encounter  Procedures   MM 3D SCREENING MAMMOGRAM BILATERAL BREAST   US  Abdomen Limited   Ambulatory referral to Gastroenterology   Pulmonary function test    Meds ordered this encounter  Medications   rosuvastatin  (CRESTOR ) 5 MG tablet    Sig: Take 1 tablet (5 mg total) by mouth daily.    Dispense:  90 tablet    Refill:  1   minoxidil  (LONITEN ) 10 MG tablet    Sig: Take 1 tablet (10 mg total) by mouth daily.    Dispense:  90 tablet    Refill:  2   metoprolol  succinate (TOPROL -XL) 100 MG 24 hr tablet    Sig: TAKE 1 TABLET BY MOUTH EVERY DAY WITH OR IMMEDIATELY FOLLOWING A MEAL    Dispense:  90 tablet    Refill:  1   metFORMIN  (GLUCOPHAGE ) 500 MG tablet    Sig: Take 1 tablet (500 mg total) by mouth 2 (two) times daily with a meal.    Dispense:  180 tablet    Refill:  1   ipratropium-albuterol  (DUONEB) 0.5-2.5 (3) MG/3ML SOLN    Sig: Take 3 mLs by nebulization every 4 (four) hours as needed (shortness of breath or wheezing).    Dispense:  360 mL    Refill:  3   desvenlafaxine  (PRISTIQ ) 100 MG 24 hr tablet    Sig: Take 1 tablet (100 mg total) by mouth daily.    Dispense:  90 tablet    Refill:  1    busPIRone  (BUSPAR ) 15 MG tablet    Sig: Take 1 tablet (15 mg total) by mouth 3 (three) times daily. For anxiety    Dispense:  270 tablet    Refill:  1   amLODipine  (NORVASC ) 10 MG tablet    Sig: Take 1 tablet (10 mg total) by mouth daily.    Dispense:  90 tablet    Refill:  1   albuterol  (VENTOLIN  HFA) 108 (90 Base) MCG/ACT inhaler    Sig: TAKE 2 PUFFS BY MOUTH EVERY 6 HOURS AS NEEDED FOR WHEEZE OR SHORTNESS OF BREATH    Dispense:  18 g    Refill:  3   budesonide -glycopyrrolate-formoterol  (BREZTRI  AEROSPHERE) 160-9-4.8 MCG/ACT AERO inhaler    Sig: Inhale 2 puffs into the lungs 2 (two) times daily.    Dispense:  10.7 g    Refill:  11    Fill new script today    Return in about 3 months (around 03/11/2024) for F/U, Osa Campoli PCP and also need PFT and f/u with DSK for OSA/asthma/copd.   Total time spent:30 Minutes Time spent includes review of chart, medications, test results, and follow up plan with the patient.   [place] Controlled Substance Database was reviewed by me.  This patient was seen by Laurence Pons, FNP-C in collaboration with Dr. Verneta Gone as a part of collaborative care agreement.  Tyneka Scafidi R. Bobbi Burow, MSN, FNP-C Internal medicine

## 2023-12-13 ENCOUNTER — Encounter: Payer: Self-pay | Admitting: Nurse Practitioner

## 2023-12-17 ENCOUNTER — Ambulatory Visit
Admission: RE | Admit: 2023-12-17 | Discharge: 2023-12-17 | Disposition: A | Discharge status: Home / Self Care | Source: Ambulatory Visit | Attending: Nurse Practitioner | Admitting: Nurse Practitioner

## 2023-12-17 DIAGNOSIS — R1903 Right lower quadrant abdominal swelling, mass and lump: Secondary | ICD-10-CM

## 2023-12-18 ENCOUNTER — Telehealth: Payer: Self-pay | Admitting: Nurse Practitioner

## 2023-12-18 NOTE — Telephone Encounter (Signed)
 GI referral sent via [facility].  Notified patient. Gave pt telephone# [phone][facility]

## 2023-12-27 ENCOUNTER — Telehealth: Payer: Self-pay | Admitting: Nurse Practitioner

## 2023-12-27 ENCOUNTER — Encounter

## 2023-12-27 NOTE — Telephone Encounter (Signed)
 GI appointment 04/07/2024 @ [facility]

## 2023-12-31 ENCOUNTER — Ambulatory Visit: Admitting: Nurse Practitioner

## 2024-01-03 ENCOUNTER — Ambulatory Visit
Admission: RE | Admit: 2024-01-03 | Discharge: 2024-01-03 | Disposition: A | Discharge status: Home / Self Care | Source: Ambulatory Visit | Attending: Nurse Practitioner | Admitting: Nurse Practitioner

## 2024-01-03 DIAGNOSIS — Z1231 Encounter for screening mammogram for malignant neoplasm of breast: Secondary | ICD-10-CM | POA: Insufficient documentation

## 2024-01-08 ENCOUNTER — Ambulatory Visit: Attending: Nurse Practitioner | Admitting: Nurse Practitioner

## 2024-01-08 ENCOUNTER — Encounter: Payer: Self-pay | Admitting: Nurse Practitioner

## 2024-01-08 VITALS — BP 135/61 | HR 69 | Temp 98.2°F | Resp 18 | Ht 69.0 in | Wt 305.0 lb

## 2024-01-08 DIAGNOSIS — M5416 Radiculopathy, lumbar region: Secondary | ICD-10-CM | POA: Insufficient documentation

## 2024-01-08 DIAGNOSIS — Z79899 Other long term (current) drug therapy: Secondary | ICD-10-CM | POA: Diagnosis present

## 2024-01-08 DIAGNOSIS — G8929 Other chronic pain: Secondary | ICD-10-CM | POA: Diagnosis present

## 2024-01-08 DIAGNOSIS — M5412 Radiculopathy, cervical region: Secondary | ICD-10-CM | POA: Insufficient documentation

## 2024-01-08 DIAGNOSIS — M4726 Other spondylosis with radiculopathy, lumbar region: Secondary | ICD-10-CM

## 2024-01-08 DIAGNOSIS — M47816 Spondylosis without myelopathy or radiculopathy, lumbar region: Secondary | ICD-10-CM | POA: Insufficient documentation

## 2024-01-08 DIAGNOSIS — G894 Chronic pain syndrome: Secondary | ICD-10-CM | POA: Insufficient documentation

## 2024-01-08 DIAGNOSIS — M1711 Unilateral primary osteoarthritis, right knee: Secondary | ICD-10-CM | POA: Diagnosis not present

## 2024-01-08 DIAGNOSIS — M51369 Other intervertebral disc degeneration, lumbar region without mention of lumbar back pain or lower extremity pain: Secondary | ICD-10-CM

## 2024-01-08 MED ORDER — OXYCODONE-ACETAMINOPHEN 5-325 MG PO TABS: 1.0000 | ORAL_TABLET | Freq: Three times a day (TID) | ORAL | 0 refills | Status: DC | PRN

## 2024-01-08 MED ORDER — PREGABALIN 100 MG PO CAPS: ORAL_CAPSULE | ORAL | 5 refills | Status: DC

## 2024-01-08 NOTE — Progress Notes (Signed)
 PROVIDER NOTE: Interpretation of information contained herein should be left to medically-trained personnel. Specific patient instructions are provided elsewhere under Patient Instructions section of medical record. This document was created in part using AI and STT-dictation technology, any transcriptional errors that may result from this process are unintentional.  Patient: Candace Clayton  Service: E/M   PCP: Liana Fish, NP  DOB: 1960/07/29  DOS: 01/08/2024  Provider: Emmy MARLA Blanch, NP  MRN: [id number]  Delivery: Face-to-face  Specialty: Interventional Pain Management  Type: Established Patient  Setting: Ambulatory outpatient facility  Specialty designation: 09  Referring Prov.: Liana Fish, NP  Location: Outpatient office facility       History of present illness (HPI) Candace Clayton, a 63 y.o. year old female, is here today because of her Primary osteoarthritis of right knee [M17.11]. Candace Clayton primary complain today is Back Pain  Pertinent problems: Candace Clayton has major depressive disorder, recurrent episode, moderate (HCC); morbid obesity (HCC); low back pain with sciatica; OSA on CPAP; lumbar degenerative disc disease; lumbar facet arthropathy (R>L); lumbar spondylosis; type 2 diabetes mellitus with hyperglycemia without long-term current use of insulin (HCC) and chronic pain syndrome on their pertinent problem list.  Pain Assessment: Severity of Chronic pain is reported as a 4 /10. Location: Back Right/Radiates down right leg. Onset: More than a month ago. Quality: Burning, Sharp. Timing: Constant. Modifying factor(s): Rest, medication. Vitals:  height is 5' 9 (1.753 m) and weight is 305 lb (138.3 kg) (abnormal). Her temperature is 98.2 F (36.8 C). Her blood pressure is 135/61 and her pulse is 69. Her respiration is 18 and oxygen saturation is 99%.  BMI: Estimated body mass index is 45.04 kg/m as calculated from the following:   Height as of this encounter: 5' 9  (1.753 m).   Weight as of this encounter: 305 lb (138.3 kg).  Last encounter: 10/16/2023 Last procedure: 09/12/2023  Reason for encounter: medication management. No change in medical history since last visit.  Patient's pain is at baseline.  Patient continues multimodal pain regimen as prescribed.  States that it provides pain relief and improvement in functional status.   Pharmacotherapy Assessment   Oxycodone -acetaminophen  (Percocet/Roxicet) 5-325 mg every 8 hours as needed for severe pain. MME=22.50  Pregabalin  (Lyrica ) 100 Mg capsules every morning, 200 Mg capsules nightly Monitoring: [place] PMP: PDMP reviewed during this encounter.       Pharmacotherapy: No side-effects or adverse reactions reported. Compliance: No problems identified. Effectiveness: Clinically acceptable.  [name], [place]  01/08/2024  8:19 AM  Sign when Signing Visit Nursing Pain Medication Assessment:  Safety precautions to be maintained throughout the outpatient stay will include: orient to surroundings, keep bed in low position, maintain call bell within reach at all times, provide assistance with transfer out of bed and ambulation.  Medication Inspection Compliance: Pill count conducted under aseptic conditions, in front of the patient. Neither the pills nor the bottle was removed from the patient's sight at any time. Once count was completed pills were immediately returned to the patient in their original bottle.  Medication: Oxycodone /APAP Pill/Patch Count: 41 of 90 pills/patches remain Pill/Patch Appearance: Markings consistent with prescribed medication Bottle Appearance: Standard pharmacy container. Clearly labeled. Filled Date: 07 / 02 / 2025 Last Medication intake:  Today    UDS:  Summary  Date Value Ref Range Status  01/25/2023 Note  Final    Comment:    ==================================================================== [employer] Select 13  (MW) ==================================================================== Test  Result       Flag       Units  Drug Present not Declared for Prescription Verification   Oxazepam                       35           UNEXPECTED ng/mg creat   Temazepam                      54           UNEXPECTED ng/mg creat    Oxazepam and temazepam are expected metabolites of diazepam .    Oxazepam is also an expected metabolite of other benzodiazepine    drugs, including chlordiazepoxide, prazepam, clorazepate, halazepam,    and temazepam.  Oxazepam and temazepam are available as scheduled    prescription medications.  Drug Absent but Declared for Prescription Verification   Oxycodone                       Not Detected UNEXPECTED ng/mg creat ==================================================================== Test                      Result    Flag   Units      Ref Range   Creatinine              69               mg/dL      >=79 ==================================================================== Declared Medications:  The flagging and interpretation on this report are based on the  following declared medications.  Unexpected results may arise from  inaccuracies in the declared medications.   **Note: The testing scope of this panel includes these medications:   Oxycodone   Oxycodone  (Percocet)   **Note: The testing scope of this panel does not include the  following reported medications:   Acetaminophen  (Percocet)  Albuterol  (Ventolin  HFA)  Albuterol  (Duoneb)  Amlodipine  (Norvasc )  Budesonide  (Breztri  Aerosphere)  Buspirone  (Buspar )  Chlorthalidone  (Hygroton )  Clobetasol  (Temovate )  Desvenlafaxine  (Pristiq )  Docusate (Stool Softener)  Fluticasone  (Flonase )  Formoterol  (Breztri  Aerosphere)  Glycopyrrolate (Breztri  Aerosphere)  Hydrochlorothiazide  (Hydrodiuril )  Ipratropium (Duoneb)  Meclizine  (Antivert )  Metformin  (Glucophage )  Methocarbamol  (Robaxin )   Metoprolol  (Toprol )  Minoxidil  (Loniten )  Multivitamin  Ondansetron  (Zofran )  Prednisone   Pregabalin  (Lyrica )  Rosuvastatin  (Crestor )  Semaglutide  (Ozempic )  Supplement  Vitamin D2 (Drisdol ) ==================================================================== For clinical consultation, please call [phone]. ====================================================================     No results found for: CBDTHCR No results found for: D8THCCBX No results found for: D9THCCBX  ROS  Constitutional: Denies any fever or chills Gastrointestinal: No reported hemesis, hematochezia, vomiting, or acute GI distress Musculoskeletal: Back pain Neurological: No reported episodes of acute onset apraxia, aphasia, dysarthria, agnosia, amnesia, paralysis, loss of coordination, or loss of consciousness  Medication Review  Microlet Lancets, Multiple Vitamins-Minerals, OVER THE COUNTER MEDICATION, Semaglutide  (2 MG/DOSE), Vitamin D  (Ergocalciferol ), albuterol , amLODipine , budesonide -glycopyrrolate-formoterol , busPIRone , chlorthalidone , cyanocobalamin , desvenlafaxine , fluticasone , glucose blood, hydrochlorothiazide , ipratropium-albuterol , metFORMIN , methocarbamol , metoprolol  succinate, minoxidil , oxyCODONE , oxyCODONE -acetaminophen , pregabalin , and rosuvastatin   History Review  Allergy: Ms. Lanum has no known allergies. Drug: Ms. Portales  reports no history of drug use. Alcohol:  reports no history of alcohol use. Tobacco:  reports that she has never smoked. She has never used smokeless tobacco. Social: Ms. Duling  reports that she has never smoked. She has never used smokeless tobacco. She reports that she does not drink alcohol and does not use drugs.  Medical:  has a past medical history of Anginal pain (HCC), Anxiety, Cholecystolithiasis, Depression, Diabetes mellitus without complication (HCC), GERD (gastroesophageal reflux disease), History of C-section (1992), Hypertension, and Sleep  apnea. Surgical: Ms. Flanagin  has a past surgical history that includes Cesarean section (1992); Finger surgery (Left, 2011); Hematoma evacuation (Left, 1999); Colonoscopy; Laparoscopic salpingo oophorectomy (Left, 04/02/2018); Colonoscopy with propofol  (N/A, 03/14/2019); and Cholecystectomy (10/26/2020). Family: family history includes Brain cancer in her mother; Breast cancer (age of onset: 63) in her mother; Cancer - Colon in her mother; Cancer - Other in her mother; Colon cancer in her brother and mother; Heart Problems in her brother and father.  Laboratory Chemistry Profile   Renal Lab Results  Component Value Date   BUN 14 11/29/2023   CREATININE 0.85 11/29/2023   BCR 16 11/29/2023   GFRAA 106 02/23/2020   GFRNONAA >60 02/21/2022    Hepatic Lab Results  Component Value Date   AST 14 11/29/2023   ALT 12 11/29/2023   ALBUMIN 4.2 11/29/2023   ALKPHOS 85 11/29/2023   LIPASE 26 02/21/2022    Electrolytes Lab Results  Component Value Date   NA 140 11/29/2023   K 4.3 11/29/2023   CL 101 11/29/2023   CALCIUM  9.3 11/29/2023    Bone Lab Results  Component Value Date   VD25OH 34.7 11/29/2023    Inflammation (CRP: Acute Phase) (ESR: Chronic Phase) Lab Results  Component Value Date   CRP 2 05/24/2021   ESRSEDRATE 12 05/24/2021         Note: Above Lab results reviewed.  Recent Imaging Review  US  Pelvis Limited CLINICAL DATA:  RIGHT lower quadrant abdominal mass.  EXAM: LIMITED ULTRASOUND OF PELVIS  TECHNIQUE: Limited transabdominal ultrasound examination of the pelvis was performed.  COMPARISON:  CT abdomen pelvis 04/03/2018  FINDINGS: Targeted sonographic evaluation of the palpable region in the RIGHT lower quadrant demonstrates large amount of peristalsing sliding bowel.  IMPRESSION: Sonographic findings suspicious for bowel containing hernia corresponding to the palpable abnormality RIGHT lower quadrant. Confirmation with contrast enhanced CT of the abdomen  and pelvis is recommended.  Electronically Signed   By: Aliene Lloyd M.D.   On: 12/24/2023 08:20 Note: Reviewed        Physical Exam  Vitals: BP 135/61   Pulse 69   Temp 98.2 F (36.8 C)   Resp 18   Ht 5' 9 (1.753 m)   Wt (!) 305 lb (138.3 kg)   SpO2 99%   BMI 45.04 kg/m  BMI: Estimated body mass index is 45.04 kg/m as calculated from the following:   Height as of this encounter: 5' 9 (1.753 m).   Weight as of this encounter: 305 lb (138.3 kg). Ideal: Ideal body weight: 66.2 kg (145 lb 15.1 oz) Adjusted ideal body weight: 95.1 kg (209 lb 9.1 oz) General appearance: Well nourished, well developed, and well hydrated. In no apparent acute distress Mental status: Alert, oriented x 3 (person, place, & time)       Respiratory: No evidence of acute respiratory distress Eyes: PERLA   Assessment   Diagnosis Status  1. Primary osteoarthritis of right knee   2. Lumbar facet arthropathy    3. Lumbar degenerative disc disease   4. Chronic pain syndrome   5. Lumbar spondylosis   6. Cervical radicular pain (right C5/6)   7. Medication management   8. Chronic radicular lumbar pain (right)    Controlled Controlled Controlled   Updated Problems: No problems updated.  Plan of  Care  Problem-specific:  Assessment and Plan Will continue on current medication regimen.  Prescribing drug monitoring (PDMP) reviewed; findings consistent with the use of prescribed medication and no evidence of narcotic misuse or abuse. Routine UDS ordered today. Schedule follow-up in 90 day for medication management.  No new issues or problems reported to this visit.   Ms. CLOTILDA HAFER has a current medication list which includes the following long-term medication(s): albuterol , amlodipine , chlorthalidone , desvenlafaxine , fluticasone , hydrochlorothiazide , ipratropium-albuterol , metformin , metoprolol  succinate, minoxidil , rosuvastatin , [START ON 01/25/2024] oxycodone -acetaminophen , [START ON 02/24/2024]  oxycodone -acetaminophen , [START ON 03/25/2024] oxycodone -acetaminophen , and pregabalin .  Pharmacotherapy (Medications Ordered): Meds ordered this encounter  Medications   oxyCODONE -acetaminophen  (PERCOCET/ROXICET) 5-325 MG tablet    Sig: Take 1 tablet by mouth every 8 (eight) hours as needed for severe pain (pain score 7-10).    Dispense:  90 tablet    Refill:  0    For chronic pain syndrome. To last for 30 days from fill date.   oxyCODONE -acetaminophen  (PERCOCET/ROXICET) 5-325 MG tablet    Sig: Take 1 tablet by mouth every 8 (eight) hours as needed for severe pain (pain score 7-10).    Dispense:  90 tablet    Refill:  0    For chronic pain syndrome. To last for 30 days from fill date.   oxyCODONE -acetaminophen  (PERCOCET/ROXICET) 5-325 MG tablet    Sig: Take 1 tablet by mouth every 8 (eight) hours as needed for severe pain (pain score 7-10).    Dispense:  90 tablet    Refill:  0    For chronic pain syndrome. To last for 30 days from fill date.   pregabalin  (LYRICA ) 100 MG capsule    Sig: 100 mg qAM, 200 mg qhs    Dispense:  90 capsule    Refill:  5    Fill one day early if pharmacy is closed on scheduled refill date. May substitute for generic if available.   Orders:  Orders Placed This Encounter  Procedures   Tox[employer]SU[employer] Select 13 (MW), Urine    Volume: 30 ml(s). Minimum 3 ml of urine is needed. Document temperature of fresh sample. Indications: Long term (current) use of opiate analgesic (S20.108)    Release to patient:   Immediate        Return in about 3 months (around 04/09/2024) for (F2F), (MM), Emmy Blanch NP.    Recent Visits Date Type Provider Dept  10/16/23 Office Visit Marcelino Nurse, MD [facility]  Showing recent visits within past 90 days and meeting all other requirements Today's Visits Date Type Provider Dept  01/08/24 Office Visit Dovie Kapusta K, NP [facility]  Showing today's visits and meeting all other requirements Future  Appointments Date Type Provider Dept  04/01/24 Appointment Sinjin Amero K, NP [facility]  Showing future appointments within next 90 days and meeting all other requirements  I discussed the assessment and treatment plan with the patient. The patient was provided an opportunity to ask questions and all were answered. The patient agreed with the plan and demonstrated an understanding of the instructions.  Patient advised to call back or seek an in-person evaluation if the symptoms or condition worsens.  Duration of encounter:  30 minutes.  Total time on encounter, as per AMA guidelines included both the face-to-face and non-face-to-face time personally spent by the physician and/or other qualified health care professional(s) on the day of the encounter (includes time in activities that require the physician or other qualified health care professional and  does not include time in activities normally performed by clinical staff). Physician's time may include the following activities when performed: Preparing to see the patient (e.g., pre-charting review of records, searching for previously ordered imaging, lab work, and nerve conduction tests) Review of prior analgesic pharmacotherapies. Reviewing PMP Interpreting ordered tests (e.g., lab work, imaging, nerve conduction tests) Performing post-procedure evaluations, including interpretation of diagnostic procedures Obtaining and/or reviewing separately obtained history Performing a medically appropriate examination and/or evaluation Counseling and educating the patient/family/caregiver Ordering medications, tests, or procedures Referring and communicating with other health care professionals (when not separately reported) Documenting clinical information in the electronic or other health record Independently interpreting results (not separately reported) and communicating results to the patient/ family/caregiver Care coordination (not  separately reported)  Note by: Jarah Pember K Eloisa Chokshi, NP (TTS and AI technology used. I apologize for any typographical errors that were not detected and corrected.) Date: 01/08/2024; Time: 9:18 AM

## 2024-01-08 NOTE — Progress Notes (Signed)
 Nursing Pain Medication Assessment:  Safety precautions to be maintained throughout the outpatient stay will include: orient to surroundings, keep bed in low position, maintain call bell within reach at all times, provide assistance with transfer out of bed and ambulation.  Medication Inspection Compliance: Pill count conducted under aseptic conditions, in front of the patient. Neither the pills nor the bottle was removed from the patient's sight at any time. Once count was completed pills were immediately returned to the patient in their original bottle.  Medication: Oxycodone /APAP Pill/Patch Count: 41 of 90 pills/patches remain Pill/Patch Appearance: Markings consistent with prescribed medication Bottle Appearance: Standard pharmacy container. Clearly labeled. Filled Date: 07 / 02 / 2025 Last Medication intake:  Today

## 2024-01-09 ENCOUNTER — Telehealth: Payer: Self-pay | Admitting: Internal Medicine

## 2024-01-09 NOTE — Telephone Encounter (Signed)
 Left vm to confirm 01/16/24 appointment-Toni

## 2024-01-11 ENCOUNTER — Other Ambulatory Visit: Payer: Self-pay | Admitting: Nurse Practitioner

## 2024-01-11 DIAGNOSIS — E559 Vitamin D deficiency, unspecified: Secondary | ICD-10-CM

## 2024-01-12 LAB — TOXASSURE SELECT 13 (MW), URINE

## 2024-01-16 ENCOUNTER — Encounter: Admitting: Internal Medicine

## 2024-01-29 ENCOUNTER — Ambulatory Visit: Admitting: Internal Medicine

## 2024-02-17 ENCOUNTER — Other Ambulatory Visit: Payer: Self-pay | Admitting: Nurse Practitioner

## 2024-02-17 DIAGNOSIS — E1165 Type 2 diabetes mellitus with hyperglycemia: Secondary | ICD-10-CM

## 2024-02-18 ENCOUNTER — Other Ambulatory Visit: Payer: Self-pay

## 2024-02-18 DIAGNOSIS — E1159 Type 2 diabetes mellitus with other circulatory complications: Secondary | ICD-10-CM

## 2024-02-18 MED ORDER — HYDROCHLOROTHIAZIDE 25 MG PO TABS: 25.0000 mg | ORAL_TABLET | Freq: Every day | ORAL | 1 refills | Status: DC

## 2024-03-10 ENCOUNTER — Ambulatory Visit (INDEPENDENT_AMBULATORY_CARE_PROVIDER_SITE_OTHER): Admitting: Nurse Practitioner

## 2024-03-10 ENCOUNTER — Encounter: Payer: Self-pay | Admitting: Nurse Practitioner

## 2024-03-10 VITALS — BP 135/80 | HR 77 | Temp 97.8°F | Resp 16 | Ht 69.0 in | Wt 304.0 lb

## 2024-03-10 DIAGNOSIS — E1169 Type 2 diabetes mellitus with other specified complication: Secondary | ICD-10-CM | POA: Diagnosis not present

## 2024-03-10 DIAGNOSIS — E559 Vitamin D deficiency, unspecified: Secondary | ICD-10-CM | POA: Insufficient documentation

## 2024-03-10 DIAGNOSIS — K458 Other specified abdominal hernia without obstruction or gangrene: Secondary | ICD-10-CM

## 2024-03-10 DIAGNOSIS — E785 Hyperlipidemia, unspecified: Secondary | ICD-10-CM

## 2024-03-10 DIAGNOSIS — E1159 Type 2 diabetes mellitus with other circulatory complications: Secondary | ICD-10-CM | POA: Diagnosis not present

## 2024-03-10 DIAGNOSIS — I152 Hypertension secondary to endocrine disorders: Secondary | ICD-10-CM

## 2024-03-10 LAB — POCT GLYCOSYLATED HEMOGLOBIN (HGB A1C): Hemoglobin A1C: 7 % — AB (ref 4.0–5.6)

## 2024-03-10 MED ORDER — CHLORTHALIDONE 25 MG PO TABS: 25.0000 mg | ORAL_TABLET | Freq: Every day | ORAL | 1 refills | Status: DC

## 2024-03-10 MED ORDER — VITAMIN D (ERGOCALCIFEROL) 1.25 MG (50000 UNIT) PO CAPS: 50000.0000 [IU] | ORAL_CAPSULE | ORAL | 1 refills | Status: DC

## 2024-03-10 MED ORDER — AMLODIPINE BESYLATE 10 MG PO TABS: 10.0000 mg | ORAL_TABLET | Freq: Every day | ORAL | 1 refills | Status: DC

## 2024-03-10 NOTE — Progress Notes (Signed)
 [facility] [address] [place], [place] [address]  Internal MEDICINE  Office Visit Note  Patient Name: Candace Clayton  [id number]  [id number]  Date of Service: 03/10/2024  Chief Complaint  Patient presents with   Depression   Gastroesophageal Reflux   Diabetes   Hypertension   Follow-up    HPI Candace Clayton presents for a follow-up visit for pelvic ultrasound results, hernia, chronic pain and diabetes.  Pelvic ultrasound results -- bowel-containing hernia in the RLQ, CT abd/pelvis is recommended.  Hernia -- large bowel-containing hernia  Chronic pain -- sees Dr. Marcelino  Diabetes -- A1c is slightly improved further to 7.0. taking ozempic  2 mg weekly.     Current Medication: Outpatient Encounter Medications as of 03/10/2024  Medication Sig   albuterol  (VENTOLIN  HFA) 108 (90 Base) MCG/ACT inhaler TAKE 2 PUFFS BY MOUTH EVERY 6 HOURS AS NEEDED FOR WHEEZE OR SHORTNESS OF BREATH   budesonide -glycopyrrolate-formoterol  (BREZTRI  AEROSPHERE) 160-9-4.8 MCG/ACT AERO inhaler Inhale 2 puffs into the lungs 2 (two) times daily.   busPIRone  (BUSPAR ) 15 MG tablet Take 1 tablet (15 mg total) by mouth 3 (three) times daily. For anxiety   cyanocobalamin  (VITAMIN B12) 1000 MCG tablet Take 1 tablet (1,000 mcg total) by mouth daily.   desvenlafaxine  (PRISTIQ ) 100 MG 24 hr tablet Take 1 tablet (100 mg total) by mouth daily.   fluticasone  (FLONASE ) 50 MCG/ACT nasal spray Place 2 sprays into both nostrils daily.   glucose blood (CONTOUR NEXT TEST) test strip 1 each by Other route daily. DX E11.65   hydrochlorothiazide  (HYDRODIURIL ) 25 MG tablet Take 1 tablet (25 mg total) by mouth daily.   ipratropium-albuterol  (DUONEB) 0.5-2.5 (3) MG/3ML SOLN Take 3 mLs by nebulization every 4 (four) hours as needed (shortness of breath or wheezing).   metFORMIN  (GLUCOPHAGE ) 500 MG tablet Take 1 tablet (500 mg total) by mouth 2 (two) times daily with a meal.   methocarbamol  (ROBAXIN ) 750 MG tablet Take 1 tablet  (750 mg total) by mouth every 8 (eight) hours as needed for muscle spasms.   metoprolol  succinate (TOPROL -XL) 100 MG 24 hr tablet TAKE 1 TABLET BY MOUTH EVERY DAY WITH OR IMMEDIATELY FOLLOWING A MEAL   Microlet Lancets MISC 1 each by Does not apply route daily. DX-E11.65   minoxidil  (LONITEN ) 10 MG tablet Take 1 tablet (10 mg total) by mouth daily.   Multiple Vitamins-Calcium  (ONE-A-DAY WOMENS PO) Take 1 tablet by mouth daily.   OVER THE COUNTER MEDICATION 1 tablet daily as needed. Takes generic stool softener for constipation d/t oxycodone .    oxyCODONE  (OXY IR/ROXICODONE ) 5 MG immediate release tablet Take by mouth.   oxyCODONE -acetaminophen  (PERCOCET/ROXICET) 5-325 MG tablet Take 1 tablet by mouth every 8 (eight) hours as needed for severe pain (pain score 7-10).   oxyCODONE -acetaminophen  (PERCOCET/ROXICET) 5-325 MG tablet Take 1 tablet by mouth every 8 (eight) hours as needed for severe pain (pain score 7-10).   [START ON 03/25/2024] oxyCODONE -acetaminophen  (PERCOCET/ROXICET) 5-325 MG tablet Take 1 tablet by mouth every 8 (eight) hours as needed for severe pain (pain score 7-10).   pregabalin  (LYRICA ) 100 MG capsule 100 mg qAM, 200 mg qhs   rosuvastatin  (CRESTOR ) 5 MG tablet Take 1 tablet (5 mg total) by mouth daily.   Semaglutide , 2 MG/DOSE, (OZEMPIC , 2 MG/DOSE,) 8 MG/3ML SOPN INJECT 2 MG INTO THE SKIN ONCE A WEEK.   [DISCONTINUED] amLODipine  (NORVASC ) 10 MG tablet Take 1 tablet (10 mg total) by mouth daily.   [DISCONTINUED] chlorthalidone  (HYGROTON ) 25 MG tablet Take  1 tablet (25 mg total) by mouth daily.   [DISCONTINUED] Vitamin D , Ergocalciferol , (DRISDOL ) 1.25 MG (50000 UNIT) CAPS capsule TAKE 1 CAPSULE (50,000 UNITS TOTAL) BY MOUTH EVERY 7 (SEVEN) DAYS   amLODipine  (NORVASC ) 10 MG tablet Take 1 tablet (10 mg total) by mouth daily.   chlorthalidone  (HYGROTON ) 25 MG tablet Take 1 tablet (25 mg total) by mouth daily.   Vitamin D , Ergocalciferol , (DRISDOL ) 1.25 MG (50000 UNIT) CAPS capsule  Take 1 capsule (50,000 Units total) by mouth every 7 (seven) days.   No facility-administered encounter medications on file as of 03/10/2024.    Surgical History: Past Surgical History:  Procedure Laterality Date   CESAREAN SECTION  1992   CHOLECYSTECTOMY  10/26/2020   COLONOSCOPY     polyps removed first procedure. 2nd time all was clear   COLONOSCOPY WITH PROPOFOL  N/A 03/14/2019   Procedure: COLONOSCOPY WITH PROPOFOL ;  Surgeon: Unk Candace Skiff, MD;  Location: [facility] ENDOSCOPY;  Service: Gastroenterology;  Laterality: N/A;   FINGER SURGERY Left 2011   left finger cut off x 2.(only up to last digit)   HEMATOMA EVACUATION Left 1999   upper part of foot was injured d/t 500lb weight landing on her foot.    LAPAROSCOPIC SALPINGO OOPHERECTOMY Left 04/02/2018   Procedure: LAPAROSCOPIC SALPINGO OOPHORECTOMY;  Surgeon: Candace Read, MD;  Location: [facility] ORS;  Service: Gynecology;  Laterality: Left;    Medical History: Past Medical History:  Diagnosis Date   Anginal pain (HCC)    tightness related to anxiety   Anxiety    Cholecystolithiasis    Depression    Diabetes mellitus without complication (HCC)    GERD (gastroesophageal reflux disease)    throws up easily but not diagnosed with reflux   History of C-section 1992   Hypertension    Sleep apnea    uses cpap    Family History: Family History  Problem Relation Age of Onset   Breast cancer Mother 40       x 3 times   Colon cancer Mother    Brain cancer Mother    Cancer - Colon Mother    Cancer - Other Mother    Heart Problems Father    Colon cancer Brother    Heart Problems Brother     Social History   Socioeconomic History   Marital status: Widowed    Spouse name: Not on file   Number of children: 1   Years of education: Not on file   Highest education level: Not on file  Occupational History   Occupation: works in Programme researcher, broadcasting/film/video  Tobacco Use   Smoking status: Never   Smokeless tobacco: Never  Vaping  Use   Vaping status: Never Used  Substance and Sexual Activity   Alcohol use: No   Drug use: No   Sexual activity: Not Currently    Birth control/protection: Post-menopausal  Other Topics Concern   Not on file  Social History Narrative   Son has schizophrenia and autism but is fully capable of helping mother after surgery   Social Drivers of Corporate investment banker Strain: Not on file  Food Insecurity: Not on file  Transportation Needs: Not on file  Physical Activity: Not on file  Stress: Not on file  Social Connections: Not on file  Intimate Partner Violence: Not on file      Review of Systems  Constitutional:  Positive for activity change (low energy) and fatigue. Negative for chills and unexpected weight change.  HENT:  Negative for congestion, sneezing and sore throat.   Respiratory: Negative.  Negative for cough, chest tightness, shortness of breath and wheezing.   Cardiovascular: Negative.  Negative for chest pain and palpitations.  Gastrointestinal:  Negative for abdominal pain, constipation, diarrhea, nausea and vomiting.  Musculoskeletal:  Positive for arthralgias and back pain. Negative for gait problem and neck pain.  Skin:  Negative for rash.  Psychiatric/Behavioral:  Negative for behavioral problems (Depression), sleep disturbance and suicidal ideas. The patient is not nervous/anxious.     Vital Signs: BP 135/80   Pulse 77   Temp 97.8 F (36.6 C)   Resp 16   Ht 5' 9 (1.753 m)   Wt (!) 304 lb (137.9 kg)   SpO2 97%   BMI 44.89 kg/m    Physical Exam Vitals reviewed.  Constitutional:      General: She is not in acute distress.    Appearance: Normal appearance. She is obese. She is not ill-appearing.  HENT:     Head: Normocephalic and atraumatic.  Eyes:     Pupils: Pupils are equal, round, and reactive to light.  Cardiovascular:     Rate and Rhythm: Normal rate and regular rhythm.  Pulmonary:     Effort: Pulmonary effort is normal. No  respiratory distress.  Abdominal:     Hernia: A hernia is present. Hernia is present in the ventral area.  Neurological:     Mental Status: She is alert and oriented to person, place, and time.  Psychiatric:        Mood and Affect: Mood normal.        Behavior: Behavior normal.        Assessment/Plan: 1. Type 2 diabetes mellitus with other specified complication, without long-term current use of insulin (HCC) (Primary) A1c is improved and stable, continue ozempic  and metformin  as prescribed.  - POCT glycosylated hemoglobin (Hb A1C) - Urine Microalbumin w/creat. ratio  2. Hypertension associated with type 2 diabetes mellitus (HCC) Stable, continue amlodipind, chlorthalidone , metoprolol  as prescribed.  - amLODipine  (NORVASC ) 10 MG tablet; Take 1 tablet (10 mg total) by mouth daily.  Dispense: 90 tablet; Refill: 1 - chlorthalidone  (HYGROTON ) 25 MG tablet; Take 1 tablet (25 mg total) by mouth daily.  Dispense: 90 tablet; Refill: 1  3. Hyperlipidemia associated with type 2 diabetes mellitus (HCC) Continue rosuvastatin  as prescribed.   4. Other specified abdominal hernia without obstruction or gangrene CT abdomen pelvis ordered as well as a referral to general surgery.  - CT ABDOMEN PELVIS W CONTRAST; Future - Ambulatory referral to General Surgery  5. Vitamin D  deficiency Continue weekly vitamin D  supplement as prescribed.  - Vitamin D , Ergocalciferol , (DRISDOL ) 1.25 MG (50000 UNIT) CAPS capsule; Take 1 capsule (50,000 Units total) by mouth every 7 (seven) days.  Dispense: 4 capsule; Refill: 1   General Counseling: Kailiana verbalizes understanding of the findings of todays visit and agrees with plan of treatment. I have discussed any further diagnostic evaluation that may be needed or ordered today. We also reviewed her medications today. she has been encouraged to call the office with any questions or concerns that should arise related to todays visit.    Orders Placed This  Encounter  Procedures   CT ABDOMEN PELVIS W CONTRAST   Urine Microalbumin w/creat. ratio   Ambulatory referral to General Surgery   POCT glycosylated hemoglobin (Hb A1C)    Meds ordered this encounter  Medications   amLODipine  (NORVASC ) 10 MG tablet    Sig: Take 1 tablet (  10 mg total) by mouth daily.    Dispense:  90 tablet    Refill:  1   chlorthalidone  (HYGROTON ) 25 MG tablet    Sig: Take 1 tablet (25 mg total) by mouth daily.    Dispense:  90 tablet    Refill:  1   Vitamin D , Ergocalciferol , (DRISDOL ) 1.25 MG (50000 UNIT) CAPS capsule    Sig: Take 1 capsule (50,000 Units total) by mouth every 7 (seven) days.    Dispense:  4 capsule    Refill:  1    Return in about 4 months (around 07/10/2024) for F/U, Recheck A1C, Maleeka Sabatino PCP.   Total time spent:30 Minutes Time spent includes review of chart, medications, test results, and follow up plan with the patient.   [place] Controlled Substance Database was reviewed by me.  This patient was seen by Mardy Maxin, FNP-C in collaboration with Dr. Sigrid Bathe as a part of collaborative care agreement.   Twanna Resh R. Maxin, MSN, FNP-C Internal medicine

## 2024-03-11 ENCOUNTER — Other Ambulatory Visit: Payer: Self-pay | Admitting: Nurse Practitioner

## 2024-03-11 DIAGNOSIS — E559 Vitamin D deficiency, unspecified: Secondary | ICD-10-CM

## 2024-03-11 LAB — MICROALBUMIN / CREATININE URINE RATIO
Creatinine, Urine: 81.4 mg/dL
Microalb/Creat Ratio: 9 mg/g{creat} (ref 0–29)
Microalbumin, Urine: 7.2 ug/mL

## 2024-03-12 ENCOUNTER — Encounter: Payer: Self-pay | Admitting: Nurse Practitioner

## 2024-03-13 ENCOUNTER — Ambulatory Visit: Admitting: Surgery

## 2024-03-13 ENCOUNTER — Encounter: Payer: Self-pay | Admitting: Surgery

## 2024-03-13 ENCOUNTER — Ambulatory Visit
Admission: RE | Admit: 2024-03-13 | Discharge: 2024-03-13 | Disposition: A | Discharge status: Home / Self Care | Source: Ambulatory Visit | Attending: Nurse Practitioner | Admitting: Nurse Practitioner

## 2024-03-13 ENCOUNTER — Other Ambulatory Visit

## 2024-03-13 VITALS — BP 144/75 | HR 71 | Temp 98.9°F | Ht 69.0 in | Wt 300.4 lb

## 2024-03-13 DIAGNOSIS — Z6841 Body Mass Index (BMI) 40.0 and over, adult: Secondary | ICD-10-CM | POA: Insufficient documentation

## 2024-03-13 DIAGNOSIS — K458 Other specified abdominal hernia without obstruction or gangrene: Secondary | ICD-10-CM

## 2024-03-13 DIAGNOSIS — K432 Incisional hernia without obstruction or gangrene: Secondary | ICD-10-CM | POA: Insufficient documentation

## 2024-03-13 DIAGNOSIS — Z9049 Acquired absence of other specified parts of digestive tract: Secondary | ICD-10-CM | POA: Insufficient documentation

## 2024-03-13 MED ORDER — IOPAMIDOL (ISOVUE-300) INJECTION 61%
100.0000 mL | Freq: Once | INTRAVENOUS | Status: AC | PRN
  Administered 2024-03-13: 100 mL via INTRAVENOUS

## 2024-03-13 NOTE — Patient Instructions (Signed)
 Hernia, Adult     A hernia happens when an organ or tissue inside your body pushes out through a weak spot in the muscles of your belly. This makes a bulge. The bulge may be: In a scar from surgery. This type of bulge is called an incisional hernia. Near your belly button. This type is called an umbilical hernia. In your groin, which is the area where your leg meets your lower belly. This type is called an inguinal hernia. If you're female, this type could also be in your scrotum. In your upper thigh. This type is called a femoral hernia. Inside your belly. This type is called a hiatal hernia. It happens when your stomach slides above your diaphragm, which is the muscle between your belly and your chest. What are the causes? You may get a hernia if: You lift heavy things. You cough over a long period of time. You have trouble pooping, also called constipation. Trouble pooping can lead to straining. There's a cut from surgery in your belly. You have a problem that's present at birth. There's fluid around your belly. You're female and have a testicle that hasn't moved down into your scrotum. You may be at greater risk for hernia if: You smoke. You're very overweight. What are the signs or symptoms? The main symptom is a bulge, but the bulge may not always be seen. It may grow bigger or be easier to see when you cough or strain, such as when you lift something heavy. If you can push the bulge back into your belly, it may not cause pain. If you can't push it back into your belly, it may lose its blood supply. This can cause: Pain. Fever. Nausea and vomiting. Swelling. Trouble pooping. How is this diagnosed? A hernia may be diagnosed based on your symptoms, medical history, and an exam. Your health care provider may ask you to cough or move in a way that makes the bulge easier to see. You may also have tests done. These may include: X-rays. Ultrasound. CT scan. How is this treated? A  hernia that's small and painless may not need to be treated. A hernia that's large or painful may be treated with surgery. Surgery involves pushing the bulge back into place and fixing the weak area of the muscle or belly. Follow these instructions at home: Activity Try not to strain. You may have to avoid lifting. Ask how much weight you can safely lift. If you lift something heavy, use your leg muscles. Do not use your back muscles to lift. Prevent trouble pooping You may need to take these actions to prevent or treat trouble pooping: Drink enough fluid to keep your pee (urine) pale yellow. Take medicines to help you poop. Eat foods high in fiber. These include beans, whole grains, and fresh fruits and vegetables. General instructions When you cough, try to cough gently. Try to push the bulge back in by very gently pressing on it when you're lying down. Do not try to force it back in if it won't push in easily. If you're overweight, work with your provider to lose weight safely. Do not smoke, vape, or use products with nicotine or tobacco in them. If you need help quitting, talk with your provider. If you're going to have surgery, watch your hernia for changes in shape, size, or color. Tell your provider about any changes. Contact a health care provider if: You get new pain, swelling, or redness near your hernia. You have trouble pooping.  Your poop is harder or larger than normal. You have a fever or chills. You have nausea or vomiting. Your hernia can't be pushed in. Get help right away if: You have belly pain that gets worse. Your hernia: Changes in shape or size. Changes color. Feels hard or hurts when you touch it. These symptoms may be an emergency. Call 911 right away. Do not wait to see if the symptoms will go away. Do not drive yourself to the hospital. This information is not intended to replace advice given to you by your health care provider. Make sure you discuss any  questions you have with your health care provider. Document Revised: 03/15/2023 Document Reviewed: 09/05/2022 Elsevier Patient Education  2024 [employer].

## 2024-03-13 NOTE — Progress Notes (Signed)
 Patient ID: Candace Clayton, female   DOB: [birth year], 63 y.o.   MRN: [id number]  Chief Complaint: Abdominal hernia  History of Present Illness Candace Clayton is a 63 y.o. female with abdominal hernia known for about 3 years.  It occurred immediately after 2 procedures at [facility] where review of her medical records confirms that she had an admission for gallstone pancreatitis, underwent ERCP with stone extraction and sphincterotomy, followed by laparoscopic cholecystectomy.  What she remembers, she reports as gallbladder surgery and some additional procedure for areas of 4-5 blockages.  she reports the bulge developed shortly after her operation, and has progressed in size over these 3 years.  She denies any pain.  He is unable to completely push it back in.  She denies any nausea, vomiting, fevers or chills.  Reports regular bowel activity and appears to be quite comfortable today.  Medical record review: Hospitalization, [facility] May 3 to 6, 2022.   Past Medical History Past Medical History:  Diagnosis Date   Anginal pain (HCC)    tightness related to anxiety   Anxiety    Cholecystolithiasis    Depression    Diabetes mellitus without complication (HCC)    GERD (gastroesophageal reflux disease)    throws up easily but not diagnosed with reflux   History of C-section 1992   Hypertension    Sleep apnea    uses cpap      Past Surgical History:  Procedure Laterality Date   CESAREAN SECTION  1992   CHOLECYSTECTOMY  10/26/2020   COLONOSCOPY     polyps removed first procedure. 2nd time all was clear   COLONOSCOPY WITH PROPOFOL  N/A 03/14/2019   Procedure: COLONOSCOPY WITH PROPOFOL ;  Surgeon: Unk Corinn Skiff, MD;  Location: [facility] ENDOSCOPY;  Service: Gastroenterology;  Laterality: N/A;   FINGER SURGERY Left 2011   left finger cut off x 2.(only up to last digit)   HEMATOMA EVACUATION Left 1999   upper part of foot was injured d/t 500lb weight landing on her foot.    LAPAROSCOPIC SALPINGO  OOPHERECTOMY Left 04/02/2018   Procedure: LAPAROSCOPIC SALPINGO OOPHORECTOMY;  Surgeon: Lake Read, MD;  Location: [facility] ORS;  Service: Gynecology;  Laterality: Left;    No Known Allergies  Current Outpatient Medications  Medication Sig Dispense Refill   amLODipine  (NORVASC ) 10 MG tablet Take 1 tablet (10 mg total) by mouth daily. 90 tablet 1   busPIRone  (BUSPAR ) 15 MG tablet Take 1 tablet (15 mg total) by mouth 3 (three) times daily. For anxiety 270 tablet 1   chlorthalidone  (HYGROTON ) 25 MG tablet Take 1 tablet (25 mg total) by mouth daily. 90 tablet 1   cyanocobalamin  (VITAMIN B12) 1000 MCG tablet Take 1 tablet (1,000 mcg total) by mouth daily. 90 tablet 1   desvenlafaxine  (PRISTIQ ) 100 MG 24 hr tablet Take 1 tablet (100 mg total) by mouth daily. 90 tablet 1   glucose blood (CONTOUR NEXT TEST) test strip 1 each by Other route daily. DX E11.65 50 each 1   hydrochlorothiazide  (HYDRODIURIL ) 25 MG tablet Take 1 tablet (25 mg total) by mouth daily. 90 tablet 1   metFORMIN  (GLUCOPHAGE ) 500 MG tablet Take 1 tablet (500 mg total) by mouth 2 (two) times daily with a meal. 180 tablet 1   methocarbamol  (ROBAXIN ) 750 MG tablet Take 1 tablet (750 mg total) by mouth every 8 (eight) hours as needed for muscle spasms. 90 tablet 1   metoprolol  succinate (TOPROL -XL) 100 MG 24 hr tablet TAKE 1 TABLET BY  MOUTH EVERY DAY WITH OR IMMEDIATELY FOLLOWING A MEAL 90 tablet 1   Microlet Lancets MISC 1 each by Does not apply route daily. DX-E11.65 50 each 1   minoxidil  (LONITEN ) 10 MG tablet Take 1 tablet (10 mg total) by mouth daily. 90 tablet 2   Multiple Vitamins-Calcium  (ONE-A-DAY WOMENS PO) Take 1 tablet by mouth daily.     OVER THE COUNTER MEDICATION 1 tablet daily as needed. Takes generic stool softener for constipation d/t oxycodone .      oxyCODONE  (OXY IR/ROXICODONE ) 5 MG immediate release tablet Take by mouth.     oxyCODONE -acetaminophen  (PERCOCET/ROXICET) 5-325 MG tablet Take 1 tablet by mouth every 8  (eight) hours as needed for severe pain (pain score 7-10). 90 tablet 0   oxyCODONE -acetaminophen  (PERCOCET/ROXICET) 5-325 MG tablet Take 1 tablet by mouth every 8 (eight) hours as needed for severe pain (pain score 7-10). 90 tablet 0   [START ON 03/25/2024] oxyCODONE -acetaminophen  (PERCOCET/ROXICET) 5-325 MG tablet Take 1 tablet by mouth every 8 (eight) hours as needed for severe pain (pain score 7-10). 90 tablet 0   pregabalin  (LYRICA ) 100 MG capsule 100 mg qAM, 200 mg qhs 90 capsule 5   rosuvastatin  (CRESTOR ) 5 MG tablet Take 1 tablet (5 mg total) by mouth daily. 90 tablet 1   Semaglutide , 2 MG/DOSE, (OZEMPIC , 2 MG/DOSE,) 8 MG/3ML SOPN INJECT 2 MG INTO THE SKIN ONCE A WEEK. 9 mL 1   Vitamin D , Ergocalciferol , (DRISDOL ) 1.25 MG (50000 UNIT) CAPS capsule TAKE 1 CAPSULE (50,000 UNITS TOTAL) BY MOUTH EVERY 7 (SEVEN) DAYS 12 capsule 1   No current facility-administered medications for this visit.    Family History Family History  Problem Relation Age of Onset   Breast cancer Mother 13       x 3 times   Colon cancer Mother    Brain cancer Mother    Cancer - Colon Mother    Cancer - Other Mother    Heart Problems Father    Colon cancer Brother    Heart Problems Brother       Social History Social History   Tobacco Use   Smoking status: Never   Smokeless tobacco: Never  Vaping Use   Vaping status: Never Used  Substance Use Topics   Alcohol use: No   Drug use: No        Review of Systems  Constitutional:  Negative for chills and fever.  HENT: Negative.    Eyes:  Positive for blurred vision.  Respiratory: Negative.  Negative for cough, shortness of breath and wheezing.   Cardiovascular: Negative.  Negative for chest pain.  Gastrointestinal:  Negative for abdominal pain, blood in stool, constipation, diarrhea, melena, nausea and vomiting.  Genitourinary:  Positive for frequency.  Skin: Negative.   Neurological: Negative.   Psychiatric/Behavioral:  Positive for depression.      Physical Exam Blood pressure (!) 144/75, pulse 71, temperature 98.9 F (37.2 C), temperature source Oral, height 5' 9 (1.753 m), weight (!) 300 lb 6.4 oz (136.3 kg), SpO2 96%. Last Weight  Most recent update: 03/13/2024 10:11 AM    Weight  136.3 kg (300 lb 6.4 oz)               CONSTITUTIONAL: Well developed, and nourished, appropriately responsive and aware without distress.   EYES: Sclera non-icteric.   EARS, NOSE, MOUTH AND THROAT:  The oropharynx is clear. Oral mucosa is pink and moist.   Hearing is intact to voice.  NECK: Trachea is midline,  and there is no jugular venous distension.  LYMPH NODES:  Lymph nodes in the neck are not appreciated. RESPIRATORY:  Lungs are clear, and breath sounds are equal bilaterally.  Normal respiratory effort without pathologic use of accessory muscles. CARDIOVASCULAR: Heart is regular in rate and rhythm.   Well perfused.  GI: The abdomen is notable for obesity, along with a very large supraumbilical midline bulge just to the right of center, soft, nontender, and nondistended.  It seems rather [place] and partially reducible, she has no tenderness whatsoever.  There were no other palpable masses.  I did not appreciate hepatosplenomegaly. There were normal bowel sounds.   MUSCULOSKELETAL:  Symmetrical muscle tone appreciated in all four extremities.    SKIN: Skin turgor is normal. No pathologic skin lesions appreciated.  NEUROLOGIC:  Motor and sensation appear grossly normal.  Cranial nerves are grossly without defect. PSYCH:  Alert and oriented to person, place and time. Affect is appropriate for situation.  Data Reviewed I have personally reviewed what is currently available of the patient's imaging, recent labs and medical records.   Labs:     Latest Ref Rng & Units 11/29/2023    8:27 AM 12/04/2022   11:37 AM 02/21/2022   12:52 PM  CBC  WBC 3.4 - 10.8 x10E3/uL 6.3  7.3  10.3   Hemoglobin 11.1 - 15.9 g/dL 87.3  87.2  85.5   Hematocrit 34.0  - 46.6 % 38.4  38.4  43.5   Platelets 150 - 450 x10E3/uL 221  253  306       Latest Ref Rng & Units 11/29/2023    8:27 AM 12/04/2022   11:37 AM 02/21/2022   12:52 PM  CMP  Glucose 70 - 99 mg/dL 876  797  835   BUN 8 - 27 mg/dL 14  14  14    Creatinine 0.57 - 1.00 mg/dL 9.14  9.16  9.23   Sodium 134 - 144 mmol/L 140  139  137   Potassium 3.5 - 5.2 mmol/L 4.3  4.8  4.0   Chloride 96 - 106 mmol/L 101  102  102   CO2 20 - 29 mmol/L 22  24  26    Calcium  8.7 - 10.3 mg/dL 9.3  9.4  9.5   Total Protein 6.0 - 8.5 g/dL 6.4  6.4  8.1   Total Bilirubin 0.0 - 1.2 mg/dL 0.3  0.3  1.0   Alkaline Phos 44 - 121 IU/L 85  99  95   AST 0 - 40 IU/L 14  12  40   ALT 0 - 32 IU/L 12  18  33      Imaging: Radiological images personally reviewed:  Ultrasound confirming presence of hernia noted. Within last 24 hrs: No results found.  Assessment    Patient Active Problem List   Diagnosis Date Noted   Incisional hernia, without obstruction or gangrene 03/13/2024   Adult BMI 40.0-44.9 kg/sq m (HCC) 03/13/2024   Vitamin D  deficiency 03/10/2024   Chronic radicular lumbar pain (right) 06/14/2022   Acute lumbar myofascial strain 01/05/2022   Cervicalgia 06/30/2021   Cervical radicular pain (right C5/6) 06/30/2021   Acute gallstone pancreatitis 10/27/2020   Acute kidney injury (HCC) 10/27/2020   Encounter for general adult medical examination with abnormal findings 05/17/2020   Acute non-recurrent pansinusitis 05/17/2020   Vasomotor rhinitis 05/17/2020   Hyperlipidemia associated with type 2 diabetes mellitus (HCC) 05/17/2020   Medication management 05/17/2020   Primary osteoarthritis of right knee 04/01/2020  Pneumonia of both lower lobes due to infectious organism 02/18/2020   Chest pain 02/13/2020   Shortness of breath 02/11/2020   Multiple closed fractures of ribs of right side 02/11/2020   Acute calculous cholecystitis 02/11/2020   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  01/27/2020    Multiple closed fractures of ribs of left side 01/27/2020   Overactive bladder 12/28/2019   Dysuria 10/05/2019   Type 2 diabetes mellitus with hyperglycemia, without long-term current use of insulin (HCC) 10/05/2019   Abnormal fasting glucose 10/05/2019   Bladder spasm 06/02/2019   Family history of colon cancer in mother    Right lower quadrant pain 03/12/2019   History of ovarian cyst 03/12/2019   Chronic pain syndrome 03/05/2019   Seasonal allergic rhinitis due to pollen 08/26/2018   Lumbar facet arthropathy (R>L) 08/22/2018   Lumbar spondylosis 08/22/2018   Left ovarian cyst 03/03/2018   Lumbar degenerative disc disease 12/23/2017   Encounter for screening colonoscopy 10/24/2017   Low back pain with sciatica 07/03/2017   Allergic rhinitis, unspecified 07/03/2017   Patellofemoral arthralgia of right knee 07/03/2017   OSA on CPAP 07/03/2017   Sedative, hypnotic, or anxiolytic use, unspecified, uncomplicated 06/27/2017   Hypersomnia 06/27/2017   Major depressive disorder, recurrent episode, moderate (HCC) 06/27/2017   Hypertension associated with type 2 diabetes mellitus (HCC) 06/27/2017   Morbid obesity (HCC) 06/27/2017   GAD (generalized anxiety disorder) 06/27/2017   Urinary tract infection 06/27/2017    Plan    Fortunately she has little to no symptoms involving the hernia.  And clearly optimizing her for hernia repair would be well worthwhile.  Will proceed with CT of abdomen and pelvis today to assess the contents of the hernia along with the size of the defect. We discussed weight loss, with the intent of repairing her hernia in an elective manner once optimized. Would appreciate assistance with weight loss with the assistance of her primary care providers.  Will have her back next week to review CT scan imaging together.  But otherwise do not anticipate urgently moving to the OR prior to attempts at decreasing her BMI. We discussed utilization of abdominal support.  I  personally spent a total of 60 minutes in the care of the patient today including preparing to see the patient, getting/reviewing separately obtained history, performing a medically appropriate exam/evaluation, counseling and educating, documenting clinical information in the EHR, and independently interpreting results.   These notes generated with voice recognition software. I apologize for typographical errors.  Honor Leghorn M.D., FACS 03/13/2024, 12:56 PM

## 2024-03-20 ENCOUNTER — Ambulatory Visit: Admitting: Surgery

## 2024-03-20 ENCOUNTER — Encounter: Payer: Self-pay | Admitting: Surgery

## 2024-03-20 VITALS — BP 137/75 | HR 68 | Temp 98.7°F | Ht 69.0 in | Wt 295.4 lb

## 2024-03-20 DIAGNOSIS — K432 Incisional hernia without obstruction or gangrene: Secondary | ICD-10-CM | POA: Diagnosis not present

## 2024-03-20 NOTE — Patient Instructions (Signed)
 Hernia, Adult     A hernia happens when an organ or tissue inside your body pushes out through a weak spot in the muscles of your belly. This makes a bulge. The bulge may be: In a scar from surgery. This type of bulge is called an incisional hernia. Near your belly button. This type is called an umbilical hernia. In your groin, which is the area where your leg meets your lower belly. This type is called an inguinal hernia. If you're female, this type could also be in your scrotum. In your upper thigh. This type is called a femoral hernia. Inside your belly. This type is called a hiatal hernia. It happens when your stomach slides above your diaphragm, which is the muscle between your belly and your chest. What are the causes? You may get a hernia if: You lift heavy things. You cough over a long period of time. You have trouble pooping, also called constipation. Trouble pooping can lead to straining. There's a cut from surgery in your belly. You have a problem that's present at birth. There's fluid around your belly. You're female and have a testicle that hasn't moved down into your scrotum. You may be at greater risk for hernia if: You smoke. You're very overweight. What are the signs or symptoms? The main symptom is a bulge, but the bulge may not always be seen. It may grow bigger or be easier to see when you cough or strain, such as when you lift something heavy. If you can push the bulge back into your belly, it may not cause pain. If you can't push it back into your belly, it may lose its blood supply. This can cause: Pain. Fever. Nausea and vomiting. Swelling. Trouble pooping. How is this diagnosed? A hernia may be diagnosed based on your symptoms, medical history, and an exam. Your health care provider may ask you to cough or move in a way that makes the bulge easier to see. You may also have tests done. These may include: X-rays. Ultrasound. CT scan. How is this treated? A  hernia that's small and painless may not need to be treated. A hernia that's large or painful may be treated with surgery. Surgery involves pushing the bulge back into place and fixing the weak area of the muscle or belly. Follow these instructions at home: Activity Try not to strain. You may have to avoid lifting. Ask how much weight you can safely lift. If you lift something heavy, use your leg muscles. Do not use your back muscles to lift. Prevent trouble pooping You may need to take these actions to prevent or treat trouble pooping: Drink enough fluid to keep your pee (urine) pale yellow. Take medicines to help you poop. Eat foods high in fiber. These include beans, whole grains, and fresh fruits and vegetables. General instructions When you cough, try to cough gently. Try to push the bulge back in by very gently pressing on it when you're lying down. Do not try to force it back in if it won't push in easily. If you're overweight, work with your provider to lose weight safely. Do not smoke, vape, or use products with nicotine or tobacco in them. If you need help quitting, talk with your provider. If you're going to have surgery, watch your hernia for changes in shape, size, or color. Tell your provider about any changes. Contact a health care provider if: You get new pain, swelling, or redness near your hernia. You have trouble pooping.  Your poop is harder or larger than normal. You have a fever or chills. You have nausea or vomiting. Your hernia can't be pushed in. Get help right away if: You have belly pain that gets worse. Your hernia: Changes in shape or size. Changes color. Feels hard or hurts when you touch it. These symptoms may be an emergency. Call 911 right away. Do not wait to see if the symptoms will go away. Do not drive yourself to the hospital. This information is not intended to replace advice given to you by your health care provider. Make sure you discuss any  questions you have with your health care provider. Document Revised: 03/15/2023 Document Reviewed: 09/05/2022 Elsevier Patient Education  2024 [employer].

## 2024-03-20 NOTE — Progress Notes (Signed)
 Patient ID: Candace Clayton, female   DOB: Nov 23, 1960, 63 y.o.   MRN: [id number]  Chief Complaint: Abdominal hernia  History of Present Illness Follow-up after CT scan to evaluate fascial defect size and is morbidly obese female. She is doing well, has very little complaints about her hernia today.  CT reviewed showing a very widemouth eventration/hernia.  Small bowel is free to go in and out of the hernia without any evidence of bottled tacking at all.  There are some extension of her hernia sac inferiorly based on gravitational pull.  This was all explained and helped her understand the location of things.  We again discussed the ideal of having 1 ventral hernia repair, the risks involved and deferring this elective hernia repair, and the necessity of her continuing to pursue a steady sustainable weight loss over the year/years to come.  Her biggest hindrance appears to be her history of back pain which we discussed is likely a huge part of her carrying this excessive weight. On her initial visit: Candace Clayton is a 63 y.o. female with abdominal hernia known for about 3 years.  It occurred immediately after 2 procedures at [facility] where review of her medical records confirms that she had an admission for gallstone pancreatitis, underwent ERCP with stone extraction and sphincterotomy, followed by laparoscopic cholecystectomy.  What she remembers, she reports as gallbladder surgery and some additional procedure for areas of 4-5 blockages.  she reports the bulge developed shortly after her operation, and has progressed in size over these 3 years.  She denies any pain.  He is unable to completely push it back in.  She denies any nausea, vomiting, fevers or chills.  Reports regular bowel activity and appears to be quite comfortable today.  Medical record review: Hospitalization, [facility] May 3 to 6, 2022.   Past Medical History Past Medical History:  Diagnosis Date   Acute calculous cholecystitis 02/11/2020    Acute gallstone pancreatitis 10/27/2020   Acute non-recurrent pansinusitis 05/17/2020   Anginal pain    tightness related to anxiety   Anxiety    Cholecystolithiasis    Depression    Diabetes mellitus without complication (HCC)    GERD (gastroesophageal reflux disease)    throws up easily but not diagnosed with reflux   History of C-section 1992   Hypertension    Pneumonia of both lower lobes due to infectious organism 02/18/2020   Sleep apnea    uses cpap      Past Surgical History:  Procedure Laterality Date   CESAREAN SECTION  1992   CHOLECYSTECTOMY  10/26/2020   COLONOSCOPY     polyps removed first procedure. 2nd time all was clear   COLONOSCOPY WITH PROPOFOL  N/A 03/14/2019   Procedure: COLONOSCOPY WITH PROPOFOL ;  Surgeon: Unk Corinn Skiff, MD;  Location: [facility] ENDOSCOPY;  Service: Gastroenterology;  Laterality: N/A;   FINGER SURGERY Left 2011   left finger cut off x 2.(only up to last digit)   HEMATOMA EVACUATION Left 1999   upper part of foot was injured d/t 500lb weight landing on her foot.    LAPAROSCOPIC SALPINGO OOPHERECTOMY Left 04/02/2018   Procedure: LAPAROSCOPIC SALPINGO OOPHORECTOMY;  Surgeon: Lake Read, MD;  Location: [facility] ORS;  Service: Gynecology;  Laterality: Left;    No Known Allergies  Current Outpatient Medications  Medication Sig Dispense Refill   amLODipine  (NORVASC ) 10 MG tablet Take 1 tablet (10 mg total) by mouth daily. 90 tablet 1   busPIRone  (BUSPAR ) 15 MG tablet Take 1  tablet (15 mg total) by mouth 3 (three) times daily. For anxiety 270 tablet 1   chlorthalidone  (HYGROTON ) 25 MG tablet Take 1 tablet (25 mg total) by mouth daily. 90 tablet 1   cyanocobalamin  (VITAMIN B12) 1000 MCG tablet Take 1 tablet (1,000 mcg total) by mouth daily. 90 tablet 1   desvenlafaxine  (PRISTIQ ) 100 MG 24 hr tablet Take 1 tablet (100 mg total) by mouth daily. 90 tablet 1   glucose blood (CONTOUR NEXT TEST) test strip 1 each by Other route daily. DX E11.65 50  each 1   hydrochlorothiazide  (HYDRODIURIL ) 25 MG tablet Take 1 tablet (25 mg total) by mouth daily. 90 tablet 1   metFORMIN  (GLUCOPHAGE ) 500 MG tablet Take 1 tablet (500 mg total) by mouth 2 (two) times daily with a meal. 180 tablet 1   methocarbamol  (ROBAXIN ) 750 MG tablet Take 1 tablet (750 mg total) by mouth every 8 (eight) hours as needed for muscle spasms. 90 tablet 1   metoprolol  succinate (TOPROL -XL) 100 MG 24 hr tablet TAKE 1 TABLET BY MOUTH EVERY DAY WITH OR IMMEDIATELY FOLLOWING A MEAL 90 tablet 1   Microlet Lancets MISC 1 each by Does not apply route daily. DX-E11.65 50 each 1   minoxidil  (LONITEN ) 10 MG tablet Take 1 tablet (10 mg total) by mouth daily. 90 tablet 2   Multiple Vitamins-Calcium  (ONE-A-DAY WOMENS PO) Take 1 tablet by mouth daily.     OVER THE COUNTER MEDICATION 1 tablet daily as needed. Takes generic stool softener for constipation d/t oxycodone .      oxyCODONE  (OXY IR/ROXICODONE ) 5 MG immediate release tablet Take by mouth.     oxyCODONE -acetaminophen  (PERCOCET/ROXICET) 5-325 MG tablet Take 1 tablet by mouth every 8 (eight) hours as needed for severe pain (pain score 7-10). 90 tablet 0   oxyCODONE -acetaminophen  (PERCOCET/ROXICET) 5-325 MG tablet Take 1 tablet by mouth every 8 (eight) hours as needed for severe pain (pain score 7-10). 90 tablet 0   [START ON 03/25/2024] oxyCODONE -acetaminophen  (PERCOCET/ROXICET) 5-325 MG tablet Take 1 tablet by mouth every 8 (eight) hours as needed for severe pain (pain score 7-10). 90 tablet 0   pregabalin  (LYRICA ) 100 MG capsule 100 mg qAM, 200 mg qhs 90 capsule 5   rosuvastatin  (CRESTOR ) 5 MG tablet Take 1 tablet (5 mg total) by mouth daily. 90 tablet 1   Semaglutide , 2 MG/DOSE, (OZEMPIC , 2 MG/DOSE,) 8 MG/3ML SOPN INJECT 2 MG INTO THE SKIN ONCE A WEEK. 9 mL 1   Vitamin D , Ergocalciferol , (DRISDOL ) 1.25 MG (50000 UNIT) CAPS capsule TAKE 1 CAPSULE (50,000 UNITS TOTAL) BY MOUTH EVERY 7 (SEVEN) DAYS 12 capsule 1   No current  facility-administered medications for this visit.    Family History Family History  Problem Relation Age of Onset   Breast cancer Mother 64       x 3 times   Colon cancer Mother    Brain cancer Mother    Cancer - Colon Mother    Cancer - Other Mother    Heart Problems Father    Colon cancer Brother    Heart Problems Brother       Social History Social History   Tobacco Use   Smoking status: Never   Smokeless tobacco: Never  Vaping Use   Vaping status: Never Used  Substance Use Topics   Alcohol use: No   Drug use: No        Review of Systems  Constitutional:  Negative for chills and fever.  HENT: Negative.  Eyes:  Positive for blurred vision.  Respiratory: Negative.  Negative for cough, shortness of breath and wheezing.   Cardiovascular: Negative.  Negative for chest pain.  Gastrointestinal:  Negative for abdominal pain, blood in stool, constipation, diarrhea, melena, nausea and vomiting.  Genitourinary:  Positive for frequency.  Skin: Negative.   Neurological: Negative.   Psychiatric/Behavioral:  Positive for depression.     Physical Exam Blood pressure 137/75, pulse 68, temperature 98.7 F (37.1 C), temperature source Oral, height 5' 9 (1.753 m), weight 295 lb 6.4 oz (134 kg), SpO2 94%. Last Weight  Most recent update: 03/20/2024  9:02 AM    Weight  134 kg (295 lb 6.4 oz)             CONSTITUTIONAL: Well developed, and nourished, appropriately responsive and aware without distress.   EYES: Sclera non-icteric.   EARS, NOSE, MOUTH AND THROAT:  The oropharynx is clear. Oral mucosa is pink and moist.   Hearing is intact to voice.  NECK: Trachea is midline, and there is no jugular venous distension.  LYMPH NODES:  Lymph nodes in the neck are not appreciated. RESPIRATORY:  Lungs are clear, and breath sounds are equal bilaterally.  Normal respiratory effort without pathologic use of accessory muscles. CARDIOVASCULAR: Heart is regular in rate and rhythm.    Well perfused.  GI: The abdomen is notable for obesity, along with a very large supraumbilical midline bulge just to the right of center, soft, nontender, and nondistended.  It seems rather widemouth and readily reducible, she has no tenderness whatsoever.  There were no other palpable masses.  I did not appreciate hepatosplenomegaly. There were normal bowel sounds.   MUSCULOSKELETAL:  Symmetrical muscle tone appreciated in all four extremities.    SKIN: Skin turgor is normal. No pathologic skin lesions appreciated.  NEUROLOGIC:  Motor and sensation appear grossly normal.  Cranial nerves are grossly without defect. PSYCH:  Alert and oriented to person, place and time. Affect is appropriate for situation.  Data Reviewed I have personally reviewed what is currently available of the patient's imaging, recent labs and medical records.   Labs:     Latest Ref Rng & Units 11/29/2023    8:27 AM 12/04/2022   11:37 AM 02/21/2022   12:52 PM  CBC  WBC 3.4 - 10.8 x10E3/uL 6.3  7.3  10.3   Hemoglobin 11.1 - 15.9 g/dL 87.3  87.2  85.5   Hematocrit 34.0 - 46.6 % 38.4  38.4  43.5   Platelets 150 - 450 x10E3/uL 221  253  306       Latest Ref Rng & Units 11/29/2023    8:27 AM 12/04/2022   11:37 AM 02/21/2022   12:52 PM  CMP  Glucose 70 - 99 mg/dL 876  797  835   BUN 8 - 27 mg/dL 14  14  14    Creatinine 0.57 - 1.00 mg/dL 9.14  9.16  9.23   Sodium 134 - 144 mmol/L 140  139  137   Potassium 3.5 - 5.2 mmol/L 4.3  4.8  4.0   Chloride 96 - 106 mmol/L 101  102  102   CO2 20 - 29 mmol/L 22  24  26    Calcium  8.7 - 10.3 mg/dL 9.3  9.4  9.5   Total Protein 6.0 - 8.5 g/dL 6.4  6.4  8.1   Total Bilirubin 0.0 - 1.2 mg/dL 0.3  0.3  1.0   Alkaline Phos 44 - 121 IU/L 85  99  95   AST 0 - 40 IU/L 14  12  40   ALT 0 - 32 IU/L 12  18  33      Imaging: Radiological images personally reviewed:  Ultrasound confirming presence of hernia noted.  CLINICAL DATA:  Right lower quadrant pain for several months. Ventral  hernia.   EXAM: CT ABDOMEN AND PELVIS WITH CONTRAST   TECHNIQUE: Multidetector CT imaging of the abdomen and pelvis was performed using the standard protocol following bolus administration of intravenous contrast.   RADIATION DOSE REDUCTION: This exam was performed according to the departmental dose-optimization program which includes automated exposure control, adjustment of the mA and/or kV according to patient size and/or use of iterative reconstruction technique.   CONTRAST:  100mL ISOVUE -300 IOPAMIDOL  (ISOVUE -300) INJECTION 61%   COMPARISON:  04/03/2018   FINDINGS: Lower Chest: No acute findings.   Hepatobiliary: No suspicious hepatic masses identified. Prior cholecystectomy. No evidence of biliary obstruction.   Pancreas:  No mass or inflammatory changes.   Spleen: Within normal limits in size and appearance.   Adrenals/Urinary Tract: No suspicious masses identified. No evidence of ureteral calculi or hydronephrosis. Unremarkable unopacified urinary bladder.   Stomach/Bowel: No evidence of obstruction, inflammatory process or abnormal fluid collections. Diverticulosis is seen mainly involving the sigmoid colon, however there is no evidence of diverticulitis. A small to moderate ventral abdominal wall hernia is seen just above the umbilicus, which contains several small bowel loops. No evidence of bowel obstruction or strangulation.   Vascular/Lymphatic: No pathologically enlarged lymph nodes. No acute vascular findings.   Reproductive:  No mass or other significant abnormality.   Other:  None.   Musculoskeletal:  No suspicious bone lesions identified.   IMPRESSION: Small to moderate ventral abdominal wall hernia just above the umbilicus, which contains several small bowel loops. No evidence of bowel obstruction or strangulation.   Colonic diverticulosis, without radiographic evidence of diverticulitis.     Electronically Signed   By: Norleen DELENA Kil  M.D.   On: 03/13/2024 17:07   Within last 24 hrs: No results found.  Assessment    Patient Active Problem List   Diagnosis Date Noted   Incisional hernia, without obstruction or gangrene 03/13/2024   Adult BMI 40.0-44.9 kg/sq m (HCC) 03/13/2024   Status post laparoscopic cholecystectomy 03/13/2024   Vitamin D  deficiency 03/10/2024   Chronic radicular lumbar pain (right) 06/14/2022   Acute lumbar myofascial strain 01/05/2022   Cervicalgia 06/30/2021   Cervical radicular pain (right C5/6) 06/30/2021   Acute kidney injury 10/27/2020   Encounter for general adult medical examination with abnormal findings 05/17/2020   Vasomotor rhinitis 05/17/2020   Hyperlipidemia associated with type 2 diabetes mellitus (HCC) 05/17/2020   Medication management 05/17/2020   Primary osteoarthritis of right knee 04/01/2020   Chest pain 02/13/2020   Shortness of breath 02/11/2020   Multiple closed fractures of ribs of right side 02/11/2020   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  01/27/2020   Multiple closed fractures of ribs of left side 01/27/2020   Overactive bladder 12/28/2019   Dysuria 10/05/2019   Type 2 diabetes mellitus with hyperglycemia, without long-term current use of insulin (HCC) 10/05/2019   Abnormal fasting glucose 10/05/2019   Bladder spasm 06/02/2019   Family history of colon cancer in mother    Right lower quadrant pain 03/12/2019   History of ovarian cyst 03/12/2019   Chronic pain syndrome 03/05/2019   Seasonal allergic rhinitis due to pollen 08/26/2018   Lumbar facet arthropathy (R>L) 08/22/2018  Lumbar spondylosis 08/22/2018   Left ovarian cyst 03/03/2018   Lumbar degenerative disc disease 12/23/2017   Encounter for screening colonoscopy 10/24/2017   Low back pain with sciatica 07/03/2017   Allergic rhinitis, unspecified 07/03/2017   Patellofemoral arthralgia of right knee 07/03/2017   OSA on CPAP 07/03/2017   Sedative, hypnotic, or anxiolytic use,  unspecified, uncomplicated 06/27/2017   Hypersomnia 06/27/2017   Major depressive disorder, recurrent episode, moderate (HCC) 06/27/2017   Hypertension associated with type 2 diabetes mellitus (HCC) 06/27/2017   Morbid obesity (HCC) 06/27/2017   GAD (generalized anxiety disorder) 06/27/2017   Urinary tract infection 06/27/2017    Plan    Fortunately she has little to no symptoms involving the hernia.  And clearly optimizing her for hernia repair would be well worthwhile.   Again, we discussed weight loss, with the intent of repairing her hernia in an elective manner once optimized. Would appreciate assistance with weight loss with the assistance of her primary care providers. I do not anticipate anyone, urgently moving to the OR prior to attempts at decreasing her BMI. We discussed utilization of abdominal support.  I personally spent a total of 30 minutes in the care of the patient today including preparing to see the patient, getting/reviewing separately obtained history, performing a medically appropriate exam/evaluation, counseling and educating, documenting clinical information in the EHR, and independently interpreting results.  We spent time reviewing her CT imaging together, and gave her strategies and encouragement for a steady/sustainable alteration of her lifestyle with diminished caloric intake and efforts to increase activity in a sustainable manner.  I anticipate having her return quarterly to semiannually for follow-up with her weight loss and continued encouragement toward progression of an elective hernia repair.  I have made her aware that one of my associates will continue this course at their discretion, as I will not be available after October of this year.   These notes generated with voice recognition software. I apologize for typographical errors.  Honor Leghorn M.D., FACS 03/20/2024, 9:05 AM

## 2024-04-01 ENCOUNTER — Encounter: Payer: Self-pay | Admitting: Nurse Practitioner

## 2024-04-01 ENCOUNTER — Ambulatory Visit: Attending: Nurse Practitioner | Admitting: Nurse Practitioner

## 2024-04-01 ENCOUNTER — Telehealth: Payer: Self-pay

## 2024-04-01 VITALS — BP 144/74 | HR 71 | Temp 97.1°F | Resp 18 | Ht 69.0 in | Wt 295.0 lb

## 2024-04-01 DIAGNOSIS — Z79899 Other long term (current) drug therapy: Secondary | ICD-10-CM | POA: Diagnosis present

## 2024-04-01 DIAGNOSIS — G8929 Other chronic pain: Secondary | ICD-10-CM | POA: Insufficient documentation

## 2024-04-01 DIAGNOSIS — M4726 Other spondylosis with radiculopathy, lumbar region: Secondary | ICD-10-CM | POA: Diagnosis not present

## 2024-04-01 DIAGNOSIS — M47816 Spondylosis without myelopathy or radiculopathy, lumbar region: Secondary | ICD-10-CM | POA: Diagnosis present

## 2024-04-01 DIAGNOSIS — M5416 Radiculopathy, lumbar region: Secondary | ICD-10-CM | POA: Insufficient documentation

## 2024-04-01 DIAGNOSIS — M5412 Radiculopathy, cervical region: Secondary | ICD-10-CM | POA: Diagnosis present

## 2024-04-01 DIAGNOSIS — G894 Chronic pain syndrome: Secondary | ICD-10-CM | POA: Diagnosis not present

## 2024-04-01 MED ORDER — OXYCODONE-ACETAMINOPHEN 5-325 MG PO TABS: 1.0000 | ORAL_TABLET | Freq: Three times a day (TID) | ORAL | 0 refills | Status: DC | PRN

## 2024-04-01 MED ORDER — PREGABALIN 100 MG PO CAPS: ORAL_CAPSULE | ORAL | 5 refills | Status: DC

## 2024-04-01 NOTE — Progress Notes (Signed)
 PROVIDER NOTE: Interpretation of information contained herein should be left to medically-trained personnel. Specific patient instructions are provided elsewhere under Patient Instructions section of medical record. This document was created in part using AI and STT-dictation technology, any transcriptional errors that may result from this process are unintentional.  Patient: Candace Clayton  Service: E/M   PCP: Liana Fish, NP  DOB: 1961-01-24  DOS: 04/01/2024  Provider: Emmy MARLA Blanch, NP  MRN: [id number]  Delivery: Face-to-face  Specialty: Interventional Pain Management  Type: Established Patient  Setting: Ambulatory outpatient facility  Specialty designation: 09  Referring Prov.: Liana Fish, NP  Location: Outpatient office facility       History of present illness (HPI) [name], a 63 y.o. year old female, is here today because of her Back pain. [name] primary complain today is Back Pain (Lower right back, radiating down right right thigh)  Pertinent problems: [name] has  major depressive disorder, recurrent episode, moderate (HCC); morbid obesity (HCC); low back pain with sciatica; OSA on CPAP; lumbar degenerative disc disease; lumbar facet arthropathy (R>L); lumbar spondylosis; type 2 diabetes mellitus with hyperglycemia without long-term current use of insulin (HCC) and chronic pain syndrome on their pertinent problem list.  Pain Assessment: Severity of Chronic pain is reported as a 5 /10. Location: Back Lower, Right/Radiating down right thigh. Onset: More than a month ago. Quality: Stabbing, Shooting, Sharp. Timing: Intermittent. Modifying factor(s): Rest. Vitals:  height is 5' 9 (1.753 m) and weight is 295 lb (133.8 kg). Her temporal temperature is 97.1 F (36.2 C) (abnormal). Her blood pressure is 144/74 (abnormal) and her pulse is 71. Her respiration is 18 and oxygen saturation is 99%.  BMI: Estimated body mass index is 43.56 kg/m as calculated from the  following:   Height as of this encounter: 5' 9 (1.753 m).   Weight as of this encounter: 295 lb (133.8 kg).  Last encounter: 01/08/2024. Last procedure: 09/12/2023  Reason for encounter: medication management. No change in medical history since last visit.  Patient's pain is at baseline.  Patient continues multimodal pain regimen as prescribed.  States that it provides pain relief and improvement in functional status.  The patient continues struggling with low back pain radiating to the back of the right hip and down to the right thigh, more pronounced on the right than the left.  She previously underwent lumbar RFA with approximately 80% pain relief lasting for about 7 months.  Currently, she is experiencing a flareup of her low back pain and expressed interest in repeating the procedure.  However, she was recently diagnosed with a ventral abdominal wall hernia and has been advised to lose weight prior to undergoing the procedure. Pharmacotherapy Assessment   Oxycodone -acetaminophen  (Percocet/Roxicet) 5-325 mg every 8 hours as needed for severe pain. MME=22.50  Pregabalin  (Lyrica ) 100 Mg capsules every morning, 200 Mg capsules nightly Monitoring: [place] PMP: PDMP reviewed during this encounter.       Pharmacotherapy: No side-effects or adverse reactions reported. Compliance: No problems identified. Effectiveness: Clinically acceptable.  Candace Clayton, [place]  04/01/2024  8:13 AM  Sign when Signing Visit Nursing Pain Medication Assessment:  Safety precautions to be maintained throughout the outpatient stay will include: orient to surroundings, keep bed in low position, maintain call bell within reach at all times, provide assistance with transfer out of bed and ambulation.  Medication Inspection Compliance: Pill count conducted under aseptic conditions, in front of the patient. Neither the pills nor the bottle was removed  from the patient's sight at any time. Once count was completed pills were  immediately returned to the patient in their original bottle.  Medication: Oxycodone  IR Pill/Patch Count: 76 of 90 pills/patches remain Pill/Patch Appearance: Markings consistent with prescribed medication Bottle Appearance: Standard pharmacy container. Clearly labeled. Filled Date: 10 / 01 / 2025 Last Medication intake:  Today    UDS:  Summary  Date Value Ref Range Status  01/08/2024 FINAL  Final    Comment:    ==================================================================== [facility] (MW) ==================================================================== Test                             Result       Flag       Units  Drug Present and Declared for Prescription Verification   Oxycodone                       1044         EXPECTED   ng/mg creat   Oxymorphone                    638          EXPECTED   ng/mg creat   Noroxycodone                   1569         EXPECTED   ng/mg creat   Noroxymorphone                 350          EXPECTED   ng/mg creat    Sources of oxycodone  are scheduled prescription medications.    Oxymorphone, noroxycodone, and noroxymorphone are expected    metabolites of oxycodone . Oxymorphone is also available as a    scheduled prescription medication.  ==================================================================== Test                      Result    Flag   Units      Ref Range   Creatinine              176              mg/dL      >=79 ==================================================================== Declared Medications:  The flagging and interpretation on this report are based on the  following declared medications.  Unexpected results may arise from  inaccuracies in the declared medications.   **Note: The testing scope of this panel includes these medications:   Oxycodone  (Roxicodone )  Oxycodone  (Percocet)   **Note: The testing scope of this panel does not include the  following reported medications:   Acetaminophen   (Percocet)  Albuterol  (Ventolin  HFA)  Amlodipine  (Norvasc )  Budesonide  (Breztri  Aerosphere)  Buspirone  (Buspar )  Chlorthalidone  (Hygroton )  Desvenlafaxine  (Pristiq )  Docusate (Stool Softener)  Fluticasone  (Flonase )  Formoterol  (Breztri  Aerosphere)  Glycopyrrolate (Breztri  Aerosphere)  Hydrochlorothiazide  (Hydrodiuril )  Metformin  (Glucophage )  Methocarbamol  (Robaxin )  Metoprolol  (Toprol )  Minoxidil  (Loniten )  Multivitamin  Pregabalin  (Lyrica )  Rosuvastatin  (Crestor )  Semaglutide  (Ozempic )  Vitamin B12  Vitamin D2 (Drisdol ) ==================================================================== For clinical consultation, please call [phone]. ====================================================================     No results found for: CBDTHCR No results found for: D8THCCBX No results found for: D9THCCBX  ROS  Constitutional: Denies any fever or chills Gastrointestinal: No reported hemesis, hematochezia, vomiting, or acute GI distress Musculoskeletal: low back pain radiating to the back of the right hip and down  to the right thigh Neurological: No reported episodes of acute onset apraxia, aphasia, dysarthria, agnosia, amnesia, paralysis, loss of coordination, or loss of consciousness  Medication Review  Microlet Lancets, Multiple Vitamins-Minerals, OVER THE COUNTER MEDICATION, Semaglutide  (2 MG/DOSE), Vitamin D  (Ergocalciferol ), amLODipine , busPIRone , chlorthalidone , cyanocobalamin , desvenlafaxine , glucose blood, hydrochlorothiazide , metFORMIN , methocarbamol , metoprolol  succinate, minoxidil , oxyCODONE , oxyCODONE -acetaminophen , pregabalin , and rosuvastatin   History Review  Allergy: Ms. Amenta has no known allergies. Drug: Ms. Slabaugh  reports no history of drug use. Alcohol:  reports no history of alcohol use. Tobacco:  reports that she has never smoked. She has never used smokeless tobacco. Social: Ms. Benn  reports that she has never smoked. She has never  used smokeless tobacco. She reports that she does not drink alcohol and does not use drugs. Medical:  has a past medical history of Acute calculous cholecystitis (02/11/2020), Acute gallstone pancreatitis (10/27/2020), Acute non-recurrent pansinusitis (05/17/2020), Anginal pain, Anxiety, Cholecystolithiasis, Depression, Diabetes mellitus without complication (HCC), GERD (gastroesophageal reflux disease), History of C-section (1992), Hypertension, Pneumonia of both lower lobes due to infectious organism (02/18/2020), and Sleep apnea. Surgical: Ms. Mallicoat  has a past surgical history that includes Cesarean section (1992); Finger surgery (Left, 2011); Hematoma evacuation (Left, 1999); Colonoscopy; Laparoscopic salpingo oophorectomy (Left, 04/02/2018); Colonoscopy with propofol  (N/A, 03/14/2019); and Cholecystectomy (10/26/2020). Family: family history includes Brain cancer in her mother; Breast cancer (age of onset: 11) in her mother; Cancer - Colon in her mother; Cancer - Other in her mother; Colon cancer in her brother and mother; Heart Problems in her brother and father.  Laboratory Chemistry Profile   Renal Lab Results  Component Value Date   BUN 14 11/29/2023   CREATININE 0.85 11/29/2023   BCR 16 11/29/2023   GFRAA 106 02/23/2020   GFRNONAA >60 02/21/2022    Hepatic Lab Results  Component Value Date   AST 14 11/29/2023   ALT 12 11/29/2023   ALBUMIN 4.2 11/29/2023   ALKPHOS 85 11/29/2023   LIPASE 26 02/21/2022    Electrolytes Lab Results  Component Value Date   NA 140 11/29/2023   K 4.3 11/29/2023   CL 101 11/29/2023   CALCIUM  9.3 11/29/2023    Bone Lab Results  Component Value Date   VD25OH 34.7 11/29/2023    Inflammation (CRP: Acute Phase) (ESR: Chronic Phase) Lab Results  Component Value Date   CRP 2 05/24/2021   ESRSEDRATE 12 05/24/2021         Note: Above Lab results reviewed.  Recent Imaging Review  CT ABDOMEN PELVIS W CONTRAST CLINICAL DATA:  Right lower  quadrant pain for several months. Ventral hernia.  EXAM: CT ABDOMEN AND PELVIS WITH CONTRAST  TECHNIQUE: Multidetector CT imaging of the abdomen and pelvis was performed using the standard protocol following bolus administration of intravenous contrast.  RADIATION DOSE REDUCTION: This exam was performed according to the departmental dose-optimization program which includes automated exposure control, adjustment of the mA and/or kV according to patient size and/or use of iterative reconstruction technique.  CONTRAST:  100mL ISOVUE -300 IOPAMIDOL  (ISOVUE -300) INJECTION 61%  COMPARISON:  04/03/2018  FINDINGS: Lower Chest: No acute findings.  Hepatobiliary: No suspicious hepatic masses identified. Prior cholecystectomy. No evidence of biliary obstruction.  Pancreas:  No mass or inflammatory changes.  Spleen: Within normal limits in size and appearance.  Adrenals/Urinary Tract: No suspicious masses identified. No evidence of ureteral calculi or hydronephrosis. Unremarkable unopacified urinary bladder.  Stomach/Bowel: No evidence of obstruction, inflammatory process or abnormal fluid collections. Diverticulosis is seen mainly involving the sigmoid colon, however  there is no evidence of diverticulitis. A small to moderate ventral abdominal wall hernia is seen just above the umbilicus, which contains several small bowel loops. No evidence of bowel obstruction or strangulation.  Vascular/Lymphatic: No pathologically enlarged lymph nodes. No acute vascular findings.  Reproductive:  No mass or other significant abnormality.  Other:  None.  Musculoskeletal:  No suspicious bone lesions identified.  IMPRESSION: Small to moderate ventral abdominal wall hernia just above the umbilicus, which contains several small bowel loops. No evidence of bowel obstruction or strangulation.  Colonic diverticulosis, without radiographic evidence of diverticulitis.  Electronically Signed    By: Norleen DELENA Kil M.D.   On: 03/13/2024 17:07 Note: Reviewed        Physical Exam  Vitals: BP (!) 144/74 (BP Location: Right Arm, Patient Position: Sitting, Cuff Size: Normal)   Pulse 71   Temp (!) 97.1 F (36.2 C) (Temporal)   Resp 18   Ht 5' 9 (1.753 m)   Wt 295 lb (133.8 kg)   SpO2 99%   BMI 43.56 kg/m  BMI: Estimated body mass index is 43.56 kg/m as calculated from the following:   Height as of this encounter: 5' 9 (1.753 m).   Weight as of this encounter: 295 lb (133.8 kg). Ideal: Ideal body weight: 66.2 kg (145 lb 15.1 oz) Adjusted ideal body weight: 93.2 kg (205 lb 9.1 oz) General appearance: Well nourished, well developed, and well hydrated. In no apparent acute distress Mental status: Alert, oriented x 3 (person, place, & time)       Respiratory: No evidence of acute respiratory distress Eyes: PERLA  Musculoskeletal: + LBP   Lumbar Exam  Skin & Axial Inspection: No masses, redness, or swelling Alignment: Symmetrical Functional ROM: Pain restricted ROM       Stability: No instability detected Muscle Tone/Strength: Functionally intact. No obvious neuro-muscular anomalies detected. Sensory (Neurological): Musculoskeletal pain pattern Palpation: No palpable anomalies       Provocative Tests: Hyperextension/rotation test: (+) bilaterally for facet joint pain. Lumbar quadrant test (Kemp's test): (+) bilaterally for facet joint pain. Lateral bending test: (+) due to pain. Patrick's Maneuver: deferred today                   FABER* test: deferred today                   *(Flexion, ABduction and External Rotation) Assessment   Diagnosis Status  1. Lumbar facet arthropathy    2. Chronic pain syndrome   3. Lumbar spondylosis   4. Cervical radicular pain (right C5/6)   5. Medication management   6. Chronic radicular lumbar pain (right)    Controlled Controlled Controlled   Updated Problems: No problems updated.  Plan of Care  Problem-specific:  Assessment  and Plan  Chronic pain syndrome: Patient's pain is well-controlled with oxycodone , we will continue on current medication regimen.  Prescribing drug monitoring (PDMP) reviewed, findings consistent with the use of prescribed medication and no evidence of narcotic misuse or abuse.  Urine drug screening (UDS) up to date.  The patient was advised to engage in at least 30 minutes of walking daily and make appropriate dietary changes to promote weight reduction and improve functional mobility.   Lumbar facet arthropathy: The patient continues struggling with low back pain radiating to the back of the right hip and down to the right thigh, more pronounced on the right than the left.  She previously underwent lumbar RFA with approximately 80% pain relief  lasting for about 7 months.  Currently, she is experiencing a flareup of her low back pain and expressed interest in repeating the procedure.   Plan: (ECT): (B) L-RFA # 2 with Dr. Marcelino   Ms. BABETTE STUM has a current medication list which includes the following long-term medication(s): amlodipine , chlorthalidone , desvenlafaxine , hydrochlorothiazide , metformin , metoprolol  succinate, minoxidil , rosuvastatin , [START ON 04/25/2024] oxycodone -acetaminophen , [START ON 05/25/2024] oxycodone -acetaminophen , [START ON 06/24/2024] oxycodone -acetaminophen , and pregabalin .  Pharmacotherapy (Medications Ordered): Meds ordered this encounter  Medications   oxyCODONE -acetaminophen  (PERCOCET/ROXICET) 5-325 MG tablet    Sig: Take 1 tablet by mouth every 8 (eight) hours as needed for severe pain (pain score 7-10).    Dispense:  90 tablet    Refill:  0    For chronic pain syndrome. To last for 30 days from fill date.   oxyCODONE -acetaminophen  (PERCOCET/ROXICET) 5-325 MG tablet    Sig: Take 1 tablet by mouth every 8 (eight) hours as needed for severe pain (pain score 7-10).    Dispense:  90 tablet    Refill:  0    For chronic pain syndrome. To last for 30 days  from fill date.   oxyCODONE -acetaminophen  (PERCOCET/ROXICET) 5-325 MG tablet    Sig: Take 1 tablet by mouth every 8 (eight) hours as needed for severe pain (pain score 7-10).    Dispense:  90 tablet    Refill:  0    For chronic pain syndrome. To last for 30 days from fill date.   pregabalin  (LYRICA ) 100 MG capsule    Sig: 100 mg qAM, 200 mg qhs    Dispense:  90 capsule    Refill:  5    Fill one day early if pharmacy is closed on scheduled refill date. May substitute for generic if available.   Orders:  No orders of the defined types were placed in this encounter.       Return in about 3 months (around 07/02/2024) for (F2F), (MM), Emmy Blanch NP.    Recent Visits Date Type Provider Dept  01/08/24 Office Visit Chlora Mcbain K, NP [facility]  Showing recent visits within past 90 days and meeting all other requirements Today's Visits Date Type Provider Dept  04/01/24 Office Visit Kalis Friese K, NP [facility]  Showing today's visits and meeting all other requirements Future Appointments Date Type Provider Dept  06/25/24 Appointment Gene Glazebrook K, NP [facility]  Showing future appointments within next 90 days and meeting all other requirements  I discussed the assessment and treatment plan with the patient. The patient was provided an opportunity to ask questions and all were answered. The patient agreed with the plan and demonstrated an understanding of the instructions.  Patient advised to call back or seek an in-person evaluation if the symptoms or condition worsens.  I personally spent a total of 30 minutes in the care of the patient today including preparing to see the patient, getting/reviewing separately obtained history, performing a medically appropriate exam/evaluation, counseling and educating, placing orders, referring and communicating with other health care professionals, documenting clinical information in the EHR, independently  interpreting results, communicating results, and coordinating care.   Note by: Emmy MARLA Blanch, NP Date: 04/01/2024; Time: 10:13 AM

## 2024-04-01 NOTE — Telephone Encounter (Signed)
 Left message for patient that Seema had placed order for RFA.

## 2024-04-01 NOTE — Progress Notes (Signed)
 Nursing Pain Medication Assessment:  Safety precautions to be maintained throughout the outpatient stay will include: orient to surroundings, keep bed in low position, maintain call bell within reach at all times, provide assistance with transfer out of bed and ambulation.  Medication Inspection Compliance: Pill count conducted under aseptic conditions, in front of the patient. Neither the pills nor the bottle was removed from the patient's sight at any time. Once count was completed pills were immediately returned to the patient in their original bottle.  Medication: Oxycodone  IR Pill/Patch Count: 76 of 90 pills/patches remain Pill/Patch Appearance: Markings consistent with prescribed medication Bottle Appearance: Standard pharmacy container. Clearly labeled. Filled Date: 10 / 01 / 2025 Last Medication intake:  Today

## 2024-04-09 ENCOUNTER — Ambulatory Visit
Admission: EM | Admit: 2024-04-09 | Discharge: 2024-04-09 | Disposition: A | Discharge status: Home / Self Care | Attending: Family Medicine | Admitting: Family Medicine

## 2024-04-09 ENCOUNTER — Encounter: Payer: Self-pay | Admitting: Gastroenterology

## 2024-04-09 DIAGNOSIS — M549 Dorsalgia, unspecified: Secondary | ICD-10-CM

## 2024-04-09 DIAGNOSIS — G8929 Other chronic pain: Secondary | ICD-10-CM | POA: Diagnosis not present

## 2024-04-09 DIAGNOSIS — M5441 Lumbago with sciatica, right side: Secondary | ICD-10-CM | POA: Diagnosis not present

## 2024-04-09 MED ORDER — PREDNISONE 10 MG (21) PO TBPK: ORAL_TABLET | Freq: Every day | ORAL | 0 refills | Status: DC

## 2024-04-09 NOTE — ED Provider Notes (Signed)
[facility]    CSN: [id number] Arrival date & time: 04/09/24  1416      History   Chief Complaint Chief Complaint  Patient presents with   Back Pain    HPI Candace Clayton is a 63 y.o. female with an extensive past medical history including chronic low back pain with lumbar degenerative disc disease and lumbar facet arthropathy and spondylosis presents for back pain.  Patient reports yesterday she developed a intermittent right lower back pain that radiates into her right buttock.  Occurs primarily with movement.  Denies any known injury or inciting event.  No numbness/tingling/weakness of her lower extremities, no bowel or bladder incontinence, no saddle paresthesia.  States she has had similar symptoms in the past that improved after steroids.  She did take her oxycodone  as well as naproxen with minimal improvement.  No other concerns at this time   Back Pain   Past Medical History:  Diagnosis Date   Acute calculous cholecystitis 02/11/2020   Acute gallstone pancreatitis 10/27/2020   Acute non-recurrent pansinusitis 05/17/2020   Anginal pain    tightness related to anxiety   Anxiety    Cholecystolithiasis    Depression    Diabetes mellitus without complication (HCC)    History of C-section 1992   Hypertension    Pneumonia of both lower lobes due to infectious organism 02/18/2020   Sleep apnea    uses cpap    Patient Active Problem List   Diagnosis Date Noted   Incisional hernia, without obstruction or gangrene 03/13/2024   Adult BMI 40.0-44.9 kg/sq m (HCC) 03/13/2024   Status post laparoscopic cholecystectomy 03/13/2024   Vitamin D  deficiency 03/10/2024   Chronic radicular lumbar pain (right) 06/14/2022   Acute lumbar myofascial strain 01/05/2022   Cervicalgia 06/30/2021   Cervical radicular pain (right C5/6) 06/30/2021   Acute kidney injury 10/27/2020   Encounter for general adult medical examination with abnormal findings 05/17/2020   Vasomotor  rhinitis 05/17/2020   Hyperlipidemia associated with type 2 diabetes mellitus (HCC) 05/17/2020   Medication management 05/17/2020   Primary osteoarthritis of right knee 04/01/2020   Chest pain 02/13/2020   Shortness of breath 02/11/2020   Multiple closed fractures of ribs of right side 02/11/2020   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  01/27/2020   Multiple closed fractures of ribs of left side 01/27/2020   Overactive bladder 12/28/2019   Dysuria 10/05/2019   Type 2 diabetes mellitus with hyperglycemia, without long-term current use of insulin (HCC) 10/05/2019   Abnormal fasting glucose 10/05/2019   Bladder spasm 06/02/2019   Family history of colon cancer in mother    Right lower quadrant pain 03/12/2019   History of ovarian cyst 03/12/2019   Chronic pain syndrome 03/05/2019   Seasonal allergic rhinitis due to pollen 08/26/2018   Lumbar facet arthropathy (R>L) 08/22/2018   Lumbar spondylosis 08/22/2018   Left ovarian cyst 03/03/2018   Lumbar degenerative disc disease 12/23/2017   Encounter for screening colonoscopy 10/24/2017   Low back pain with sciatica 07/03/2017   Allergic rhinitis, unspecified 07/03/2017   Patellofemoral arthralgia of right knee 07/03/2017   OSA on CPAP 07/03/2017   Sedative, hypnotic, or anxiolytic use, unspecified, uncomplicated 06/27/2017   Hypersomnia 06/27/2017   Major depressive disorder, recurrent episode, moderate (HCC) 06/27/2017   Hypertension associated with type 2 diabetes mellitus (HCC) 06/27/2017   Morbid obesity (HCC) 06/27/2017   GAD (generalized anxiety disorder) 06/27/2017   Urinary tract infection 06/27/2017    Past Surgical  History:  Procedure Laterality Date   CESAREAN SECTION  1992   CHOLECYSTECTOMY  10/26/2020   COLONOSCOPY     polyps removed first procedure. 2nd time all was clear   COLONOSCOPY WITH PROPOFOL  N/A 03/14/2019   Procedure: COLONOSCOPY WITH PROPOFOL ;  Surgeon: Unk Corinn Skiff, MD;  Location: [facility]  ENDOSCOPY;  Service: Gastroenterology;  Laterality: N/A;   FINGER SURGERY Left 2011   left finger cut off x 2.(only up to last digit)   HEMATOMA EVACUATION Left 1999   upper part of foot was injured d/t 500lb weight landing on her foot.    LAPAROSCOPIC SALPINGO OOPHERECTOMY Left 04/02/2018   Procedure: LAPAROSCOPIC SALPINGO OOPHORECTOMY;  Surgeon: Lake Read, MD;  Location: [facility] ORS;  Service: Gynecology;  Laterality: Left;    OB History     Gravida  2   Para  2   Term  2   Preterm      AB      Living         SAB      IAB      Ectopic      Multiple      Live Births  2            Home Medications    Prior to Admission medications   Medication Sig Start Date End Date Taking? Authorizing Provider  amLODipine  (NORVASC ) 10 MG tablet Take 1 tablet (10 mg total) by mouth daily. 03/10/24  Yes Abernathy, Mardy, NP  busPIRone  (BUSPAR ) 15 MG tablet Take 1 tablet (15 mg total) by mouth 3 (three) times daily. For anxiety 12/10/23  Yes Abernathy, Alyssa, NP  chlorthalidone  (HYGROTON ) 25 MG tablet Take 1 tablet (25 mg total) by mouth daily. 03/10/24  Yes Abernathy, Mardy, NP  cyanocobalamin  (VITAMIN B12) 1000 MCG tablet Take 1 tablet (1,000 mcg total) by mouth daily. Patient taking differently: Take 1,000 mcg by mouth once a week. 06/14/23  Yes Abernathy, Mardy, NP  desvenlafaxine  (PRISTIQ ) 100 MG 24 hr tablet Take 1 tablet (100 mg total) by mouth daily. 12/10/23  Yes Abernathy, Mardy, NP  enalapril  (VASOTEC ) 20 MG tablet Take 20 mg by mouth daily.   Yes [provider]  glucose blood (CONTOUR NEXT TEST) test strip 1 each by Other route daily. DX E11.65 07/26/20  Yes Arloa Waddell RAMAN, NP  hydrochlorothiazide  (HYDRODIURIL ) 25 MG tablet Take 1 tablet (25 mg total) by mouth daily. 02/18/24  Yes Abernathy, Mardy, NP  metFORMIN  (GLUCOPHAGE ) 500 MG tablet Take 1 tablet (500 mg total) by mouth 2 (two) times daily with a meal. 12/10/23  Yes Abernathy, Alyssa, NP   metoprolol  succinate (TOPROL -XL) 100 MG 24 hr tablet TAKE 1 TABLET BY MOUTH EVERY DAY WITH OR IMMEDIATELY FOLLOWING A MEAL 12/10/23  Yes Abernathy, Alyssa, NP  Microlet Lancets MISC 1 each by Does not apply route daily. DX-E11.65 09/22/19  Yes Boscia, Powell BRAVO, NP  minoxidil  (LONITEN ) 10 MG tablet Take 1 tablet (10 mg total) by mouth daily. 12/10/23  Yes Abernathy, Mardy, NP  oxyCODONE -acetaminophen  (PERCOCET/ROXICET) 5-325 MG tablet Take 1 tablet by mouth every 8 (eight) hours as needed for severe pain (pain score 7-10). 06/24/24 07/24/24 Yes Patel, Seema K, NP  predniSONE  (STERAPRED UNI-PAK 21 TAB) 10 MG (21) TBPK tablet Take by mouth daily. Take 6 tabs by mouth daily  for 1 day, then 5 tabs for 1 day, then 4 tabs for 1 day, then 3 tabs for 1 day, 2 tabs for 1 day, then 1 tab by mouth  daily for 1 days 04/09/24  Yes Dessie Tatem, Jodi R, NP  pregabalin  (LYRICA ) 100 MG capsule 100 mg qAM, 200 mg qhs 04/01/24  Yes Patel, Seema K, NP  rosuvastatin  (CRESTOR ) 5 MG tablet Take 1 tablet (5 mg total) by mouth daily. 12/10/23  Yes Abernathy, Alyssa, NP  Semaglutide , 2 MG/DOSE, (OZEMPIC , 2 MG/DOSE,) 8 MG/3ML SOPN INJECT 2 MG INTO THE SKIN ONCE A WEEK. 02/18/24  Yes Abernathy, Alyssa, NP  ALPRAZolam  (XANAX ) 0.5 MG tablet Take 0.5 mg by mouth at bedtime as needed for anxiety.    [provider]  methocarbamol  (ROBAXIN ) 750 MG tablet Take 1 tablet (750 mg total) by mouth every 8 (eight) hours as needed for muscle spasms. 01/25/23   Marcelino Nurse, MD  Multiple Vitamins-Calcium  (ONE-A-DAY WOMENS PO) Take 1 tablet by mouth daily.    [provider]  OVER THE COUNTER MEDICATION 1 tablet daily as needed. Takes generic stool softener for constipation d/t oxycodone .     [provider]  oxyCODONE -acetaminophen  (PERCOCET/ROXICET) 5-325 MG tablet Take 1 tablet by mouth every 8 (eight) hours as needed for severe pain (pain score 7-10). 04/25/24 05/25/24  Patel, Seema K, NP  oxyCODONE -acetaminophen   (PERCOCET/ROXICET) 5-325 MG tablet Take 1 tablet by mouth every 8 (eight) hours as needed for severe pain (pain score 7-10). 05/25/24 06/24/24  Patel, Seema K, NP  tiZANidine  (ZANAFLEX ) 4 MG capsule Take 4 mg by mouth 3 (three) times daily.    [provider]  Vitamin D , Ergocalciferol , (DRISDOL ) 1.25 MG (50000 UNIT) CAPS capsule TAKE 1 CAPSULE (50,000 UNITS TOTAL) BY MOUTH EVERY 7 (SEVEN) DAYS 03/11/24   Liana Fish, NP    Family History Family History  Problem Relation Age of Onset   Breast cancer Mother 40       x 3 times   Colon cancer Mother    Brain cancer Mother    Cancer - Colon Mother    Cancer - Other Mother    Heart Problems Father    Colon cancer Brother    Heart Problems Brother     Social History Social History   Tobacco Use   Smoking status: Never   Smokeless tobacco: Never  Vaping Use   Vaping status: Never Used  Substance Use Topics   Alcohol use: No   Drug use: No     Allergies   Patient has no known allergies.   Review of Systems Review of Systems  Musculoskeletal:  Positive for back pain.     Physical Exam Triage Vital Signs ED Triage Vitals  Encounter Vitals Group     BP 04/09/24 1437 (!) 142/82     Girls Systolic BP Percentile --      Girls Diastolic BP Percentile --      Boys Systolic BP Percentile --      Boys Diastolic BP Percentile --      Pulse Rate 04/09/24 1437 67     Resp 04/09/24 1437 16     Temp 04/09/24 1437 98 F (36.7 C)     Temp Source 04/09/24 1437 Oral     SpO2 04/09/24 1437 96 %     Weight --      Height --      Head Circumference --      Peak Flow --      Pain Score 04/09/24 1428 10     Pain Loc --      Pain Education --      Exclude from Growth Chart --  No data found.  Updated Vital Signs BP (!) 142/82 (BP Location: Right Arm)   Pulse 67   Temp 98 F (36.7 C) (Oral)   Resp 16   SpO2 96%   Visual Acuity Right Eye Distance:   Left Eye Distance:   Bilateral Distance:    Right Eye  Near:   Left Eye Near:    Bilateral Near:     Physical Exam Vitals and nursing note reviewed.  Constitutional:      General: She is not in acute distress.    Appearance: Normal appearance. She is not ill-appearing.  HENT:     Head: Normocephalic and atraumatic.  Eyes:     Pupils: Pupils are equal, round, and reactive to light.  Cardiovascular:     Rate and Rhythm: Normal rate.  Pulmonary:     Effort: Pulmonary effort is normal.  Musculoskeletal:     Lumbar back: Tenderness present. No swelling, edema, deformity, signs of trauma, lacerations, spasms or bony tenderness. Normal range of motion. Positive right straight leg raise test. Negative left straight leg raise test. No scoliosis.       Back:     Comments: Strength is 5 out of 5 bilateral lower extremities  Skin:    General: Skin is warm and dry.  Neurological:     General: No focal deficit present.     Mental Status: She is alert and oriented to person, place, and time.  Psychiatric:        Mood and Affect: Mood normal.        Behavior: Behavior normal.      UC Treatments / Results  Labs (all labs ordered are listed, but only abnormal results are displayed) Labs Reviewed - No data to display  EKG   Radiology No results found.  Procedures Procedures (including critical care time)  Medications Ordered in UC Medications - No data to display  Initial Impression / Assessment and Plan / UC Course  I have reviewed the triage vital signs and the nursing notes.  Pertinent labs & imaging results that were available during my care of the patient were reviewed by me and considered in my medical decision making (see chart for details).     Reviewed exam and symptoms with patient.  No red flags.  Patient presenting with acute on chronic low back pain.  Will do prednisone  taper and have her follow-up with her PCP or pain management for additional treatment options if symptoms do not improve.  ER precautions  reviewed. Final Clinical Impressions(s) / UC Diagnoses   Final diagnoses:  Acute on chronic back pain  Chronic right-sided low back pain with right-sided sciatica     Discharge Instructions      Start the steroid taper as prescribed. Follow up with your PCP or pain management for additional treatment options if symptoms do not improve. Please go to the ER for any worsening symptoms. I hope you feel better soon!    ED Prescriptions     Medication Sig Dispense Auth. Provider   predniSONE  (STERAPRED UNI-PAK 21 TAB) 10 MG (21) TBPK tablet Take by mouth daily. Take 6 tabs by mouth daily  for 1 day, then 5 tabs for 1 day, then 4 tabs for 1 day, then 3 tabs for 1 day, 2 tabs for 1 day, then 1 tab by mouth daily for 1 days 21 tablet Roxas Clymer, Jodi R, NP      PDMP not reviewed this encounter.   Loreda Myla SAUNDERS, NP  04/09/24 1458  

## 2024-04-09 NOTE — ED Triage Notes (Signed)
 Pt reports pain in R buttocks that started yesterday. No injury. Does not radiate but feels a burning in RLE.  Has procedure scheduled for separate pain. Has had this before and it improved with steroids. Took Aleve and Oxycodone  with no relief.

## 2024-04-09 NOTE — Discharge Instructions (Signed)
 Start the steroid taper as prescribed. Follow up with your PCP or pain management for additional treatment options if symptoms do not improve. Please go to the ER for any worsening symptoms. I hope you feel better soon!

## 2024-04-10 ENCOUNTER — Ambulatory Visit: Admitting: Internal Medicine

## 2024-04-10 ENCOUNTER — Encounter: Payer: Self-pay | Admitting: Anesthesiology

## 2024-04-11 ENCOUNTER — Encounter: Payer: Self-pay | Admitting: Emergency Medicine

## 2024-04-11 ENCOUNTER — Ambulatory Visit
Admission: EM | Admit: 2024-04-11 | Discharge: 2024-04-11 | Disposition: A | Discharge status: Home / Self Care | Attending: Emergency Medicine | Admitting: Emergency Medicine

## 2024-04-11 DIAGNOSIS — G8929 Other chronic pain: Secondary | ICD-10-CM

## 2024-04-11 DIAGNOSIS — M545 Low back pain, unspecified: Secondary | ICD-10-CM | POA: Diagnosis not present

## 2024-04-11 MED ORDER — DEXAMETHASONE SOD PHOSPHATE PF 10 MG/ML IJ SOLN
10.0000 mg | Freq: Once | INTRAMUSCULAR | Status: AC
  Administered 2024-04-11: 10 mg via INTRAMUSCULAR

## 2024-04-11 NOTE — ED Provider Notes (Signed)
 [facility]    CSN: [id number] Arrival date & time: 04/11/24  1121      History   Chief Complaint Chief Complaint  Patient presents with   Back Pain    HPI Candace Clayton is a 63 y.o. female.   HPI  63 year old female with past medical history significant for chronic pain syndrome combo chronic low back pain with lumbar DDD and lumbar facet arthropathy, and spondylosis presents for evaluation of an acute flare of right-sided low back pain.  She was seen in this urgent care 2 days ago and was prescribed prednisone .  She has taken 3 days worth of prednisone  without improvement of her symptoms.  She has also been using Percocet that she is prescribed by pain management without any improvement of symptoms.  She is not doing any home physical therapy and she has not been seen by a spine specialist.  She denies any saddle anesthesia or loss of bowel or bladder control.  Past Medical History:  Diagnosis Date   Acute calculous cholecystitis 02/11/2020   Acute gallstone pancreatitis 10/27/2020   Acute non-recurrent pansinusitis 05/17/2020   Anginal pain    tightness related to anxiety   Anxiety    Cholecystolithiasis    Chronic pain syndrome    Depression    Diabetes mellitus without complication (HCC)    Generalized anxiety disorder    Hair loss disorder    History of C-section 1992   Hypertension    Lumbar facet arthropathy    Morbid obesity with BMI of 45.0-49.9, adult (HCC)    Nystagmus    Pneumonia of both lower lobes due to infectious organism 02/18/2020   Sleep apnea    uses cpap   Vertigo     Patient Active Problem List   Diagnosis Date Noted   Incisional hernia, without obstruction or gangrene 03/13/2024   Adult BMI 40.0-44.9 kg/sq m (HCC) 03/13/2024   Status post laparoscopic cholecystectomy 03/13/2024   Vitamin D  deficiency 03/10/2024   Chronic radicular lumbar pain (right) 06/14/2022   Acute lumbar myofascial strain 01/05/2022   Cervicalgia  06/30/2021   Cervical radicular pain (right C5/6) 06/30/2021   Acute kidney injury 10/27/2020   Encounter for general adult medical examination with abnormal findings 05/17/2020   Vasomotor rhinitis 05/17/2020   Hyperlipidemia associated with type 2 diabetes mellitus (HCC) 05/17/2020   Medication management 05/17/2020   Primary osteoarthritis of right knee 04/01/2020   Chest pain 02/13/2020   Shortness of breath 02/11/2020   Multiple closed fractures of ribs of right side 02/11/2020   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  01/27/2020   Multiple closed fractures of ribs of left side 01/27/2020   Overactive bladder 12/28/2019   Dysuria 10/05/2019   Type 2 diabetes mellitus with hyperglycemia, without long-term current use of insulin (HCC) 10/05/2019   Abnormal fasting glucose 10/05/2019   Bladder spasm 06/02/2019   Family history of colon cancer in mother    Right lower quadrant pain 03/12/2019   History of ovarian cyst 03/12/2019   Chronic pain syndrome 03/05/2019   Seasonal allergic rhinitis due to pollen 08/26/2018   Lumbar facet arthropathy (R>L) 08/22/2018   Lumbar spondylosis 08/22/2018   Left ovarian cyst 03/03/2018   Lumbar degenerative disc disease 12/23/2017   Encounter for screening colonoscopy 10/24/2017   Low back pain with sciatica 07/03/2017   Allergic rhinitis, unspecified 07/03/2017   Patellofemoral arthralgia of right knee 07/03/2017   OSA on CPAP 07/03/2017   Sedative, hypnotic, or anxiolytic  use, unspecified, uncomplicated 06/27/2017   Hypersomnia 06/27/2017   Major depressive disorder, recurrent episode, moderate (HCC) 06/27/2017   Hypertension associated with type 2 diabetes mellitus (HCC) 06/27/2017   Morbid obesity (HCC) 06/27/2017   GAD (generalized anxiety disorder) 06/27/2017   Urinary tract infection 06/27/2017    Past Surgical History:  Procedure Laterality Date   CESAREAN SECTION  1992   CHOLECYSTECTOMY  10/26/2020   COLONOSCOPY      polyps removed first procedure. 2nd time all was clear   COLONOSCOPY WITH PROPOFOL  N/A 03/14/2019   Procedure: COLONOSCOPY WITH PROPOFOL ;  Surgeon: Unk Corinn Skiff, MD;  Location: [facility] ENDOSCOPY;  Service: Gastroenterology;  Laterality: N/A;   FINGER SURGERY Left 2011   left finger cut off x 2.(only up to last digit)   HEMATOMA EVACUATION Left 1999   upper part of foot was injured d/t 500lb weight landing on her foot.    LAPAROSCOPIC SALPINGO OOPHERECTOMY Left 04/02/2018   Procedure: LAPAROSCOPIC SALPINGO OOPHORECTOMY;  Surgeon: Lake Read, MD;  Location: [facility] ORS;  Service: Gynecology;  Laterality: Left;    OB History     Gravida  2   Para  2   Term  2   Preterm      AB      Living         SAB      IAB      Ectopic      Multiple      Live Births  2            Home Medications    Prior to Admission medications   Medication Sig Start Date End Date Taking? Authorizing Provider  ALPRAZolam  (XANAX ) 0.5 MG tablet Take 0.5 mg by mouth at bedtime as needed for anxiety.    [provider]  amLODipine  (NORVASC ) 10 MG tablet Take 1 tablet (10 mg total) by mouth daily. 03/10/24   Liana Fish, NP  busPIRone  (BUSPAR ) 15 MG tablet Take 1 tablet (15 mg total) by mouth 3 (three) times daily. For anxiety 12/10/23   Liana Fish, NP  chlorthalidone  (HYGROTON ) 25 MG tablet Take 1 tablet (25 mg total) by mouth daily. 03/10/24   Liana Fish, NP  cyanocobalamin  (VITAMIN B12) 1000 MCG tablet Take 1 tablet (1,000 mcg total) by mouth daily. Patient taking differently: Take 1,000 mcg by mouth once a week. 06/14/23   Liana Fish, NP  desvenlafaxine  (PRISTIQ ) 100 MG 24 hr tablet Take 1 tablet (100 mg total) by mouth daily. 12/10/23   Abernathy, Alyssa, NP  enalapril  (VASOTEC ) 20 MG tablet Take 20 mg by mouth daily.    [provider]  glucose blood (CONTOUR NEXT TEST) test strip 1 each by Other route daily. DX E11.65 07/26/20   Arloa Waddell RAMAN,  NP  hydrochlorothiazide  (HYDRODIURIL ) 25 MG tablet Take 1 tablet (25 mg total) by mouth daily. 02/18/24   Abernathy, Alyssa, NP  metFORMIN  (GLUCOPHAGE ) 500 MG tablet Take 1 tablet (500 mg total) by mouth 2 (two) times daily with a meal. 12/10/23   Abernathy, Fish, NP  methocarbamol  (ROBAXIN ) 750 MG tablet Take 1 tablet (750 mg total) by mouth every 8 (eight) hours as needed for muscle spasms. 01/25/23   Marcelino Nurse, MD  metoprolol  succinate (TOPROL -XL) 100 MG 24 hr tablet TAKE 1 TABLET BY MOUTH EVERY DAY WITH OR IMMEDIATELY FOLLOWING A MEAL 12/10/23   Liana Fish, NP  Microlet Lancets MISC 1 each by Does not apply route daily. DX-E11.65 09/22/19   Hanford Powell BRAVO,  NP  minoxidil  (LONITEN ) 10 MG tablet Take 1 tablet (10 mg total) by mouth daily. 12/10/23   Liana Fish, NP  Multiple Vitamins-Calcium  (ONE-A-DAY WOMENS PO) Take 1 tablet by mouth daily.    [provider]  OVER THE COUNTER MEDICATION 1 tablet daily as needed. Takes generic stool softener for constipation d/t oxycodone .     [provider]  oxyCODONE -acetaminophen  (PERCOCET/ROXICET) 5-325 MG tablet Take 1 tablet by mouth every 8 (eight) hours as needed for severe pain (pain score 7-10). 04/25/24 05/25/24  Patel, Seema K, NP  oxyCODONE -acetaminophen  (PERCOCET/ROXICET) 5-325 MG tablet Take 1 tablet by mouth every 8 (eight) hours as needed for severe pain (pain score 7-10). 05/25/24 06/24/24  Patel, Seema K, NP  oxyCODONE -acetaminophen  (PERCOCET/ROXICET) 5-325 MG tablet Take 1 tablet by mouth every 8 (eight) hours as needed for severe pain (pain score 7-10). 06/24/24 07/24/24  Patel, Seema K, NP  predniSONE  (STERAPRED UNI-PAK 21 TAB) 10 MG (21) TBPK tablet Take by mouth daily. Take 6 tabs by mouth daily  for 1 day, then 5 tabs for 1 day, then 4 tabs for 1 day, then 3 tabs for 1 day, 2 tabs for 1 day, then 1 tab by mouth daily for 1 days 04/09/24   Mayer, Jodi R, NP  pregabalin  (LYRICA ) 100 MG capsule 100 mg qAM, 200 mg  qhs 04/01/24   Patel, Seema K, NP  rosuvastatin  (CRESTOR ) 5 MG tablet Take 1 tablet (5 mg total) by mouth daily. 12/10/23   Liana Fish, NP  Semaglutide , 2 MG/DOSE, (OZEMPIC , 2 MG/DOSE,) 8 MG/3ML SOPN INJECT 2 MG INTO THE SKIN ONCE A WEEK. 02/18/24   Liana Fish, NP  tiZANidine  (ZANAFLEX ) 4 MG capsule Take 4 mg by mouth 3 (three) times daily.    [provider]  Vitamin D , Ergocalciferol , (DRISDOL ) 1.25 MG (50000 UNIT) CAPS capsule TAKE 1 CAPSULE (50,000 UNITS TOTAL) BY MOUTH EVERY 7 (SEVEN) DAYS 03/11/24   Liana Fish, NP    Family History Family History  Problem Relation Age of Onset   Breast cancer Mother 40       x 3 times   Colon cancer Mother    Brain cancer Mother    Cancer - Colon Mother    Cancer - Other Mother    Heart Problems Father    Colon cancer Brother    Heart Problems Brother     Social History Social History   Tobacco Use   Smoking status: Never   Smokeless tobacco: Never  Vaping Use   Vaping status: Never Used  Substance Use Topics   Alcohol use: No   Drug use: No     Allergies   Patient has no known allergies.   Review of Systems Review of Systems  Genitourinary:  Negative for difficulty urinating.  Musculoskeletal:  Positive for back pain.  Neurological:  Negative for weakness and numbness.     Physical Exam Triage Vital Signs ED Triage Vitals  Encounter Vitals Group     BP 04/11/24 1213 (!) 146/77     Girls Systolic BP Percentile --      Girls Diastolic BP Percentile --      Boys Systolic BP Percentile --      Boys Diastolic BP Percentile --      Pulse Rate 04/11/24 1213 69     Resp 04/11/24 1213 15     Temp 04/11/24 1213 98.1 F (36.7 C)     Temp Source 04/11/24 1213 Oral     SpO2  04/11/24 1213 97 %     Weight 04/11/24 1212 299 lb 2.6 oz (135.7 kg)     Height 04/11/24 1212 5' 9 (1.753 m)     Head Circumference --      Peak Flow --      Pain Score 04/11/24 1212 3     Pain Loc --      Pain Education --       Exclude from Growth Chart --    No data found.  Updated Vital Signs BP (!) 146/77 (BP Location: Left Arm)   Pulse 69   Temp 98.1 F (36.7 C) (Oral)   Resp 15   Ht 5' 9 (1.753 m)   Wt 299 lb 2.6 oz (135.7 kg)   SpO2 97%   BMI 44.18 kg/m   Visual Acuity Right Eye Distance:   Left Eye Distance:   Bilateral Distance:    Right Eye Near:   Left Eye Near:    Bilateral Near:     Physical Exam Vitals and nursing note reviewed.  Constitutional:      Appearance: Normal appearance. She is not ill-appearing.  HENT:     Head: Normocephalic and atraumatic.  Musculoskeletal:        General: Tenderness present. No signs of injury.  Skin:    General: Skin is warm and dry.     Capillary Refill: Capillary refill takes less than 2 seconds.     Findings: No rash.  Neurological:     General: No focal deficit present.     Mental Status: She is alert and oriented to person, place, and time.      UC Treatments / Results  Labs (all labs ordered are listed, but only abnormal results are displayed) Labs Reviewed - No data to display  EKG   Radiology No results found.  Procedures Procedures (including critical care time)  Medications Ordered in UC Medications  dexamethasone  (DECADRON ) injection 10 mg (has no administration in time range)    Initial Impression / Assessment and Plan / UC Course  I have reviewed the triage vital signs and the nursing notes.  Pertinent labs & imaging results that were available during my care of the patient were reviewed by me and considered in my medical decision making (see chart for details).   Patient is a nontoxic-appearing 49 old female presenting for evaluation of acute on chronic low back pain.  She does have tenderness in her right lower lumbar paraspinous region.  She has been taking oral prednisone  for last 3 days on a taper without any improvement of symptoms.  However, she was not given an injection of steroids at the onset.  I  have advised her that the only thing I can offer her is we can give her a shot of Decadron  here in clinic and have her finish her prednisone  taper.  She is continue to use the Percocet as well as her tizanidine  without any improvement of symptoms.  I will give her home physical therapy exercises to provide as well.  If her symptoms do not improve she needs to follow-up with a spine specialist as she may require surgery given her degree of lumbar spine issues.  Patient is also requesting a work note through Tuesday because she has a colonoscopy on Monday.   Final Clinical Impressions(s) / UC Diagnoses   Final diagnoses:  Acute on chronic low back pain     Discharge Instructions      Finish the prednisone  taper that  you were previously prescribed and continue to use your tizanidine  to help with muscle pain or spasm.  You may also continue to take the pain medication prescribed by your pain management specialist.  I have included a handout on physical therapy exercises that you can perform to see if this improves your pain in your low back.  If the physical therapy, steroids, and muscle laxer's do not improve your pain I would recommend that you follow-up with the spine specialist as you may need additional imaging of your spine and possibly surgery.     ED Prescriptions   None    PDMP not reviewed this encounter.   Bernardino Ditch, NP 04/11/24 1230

## 2024-04-11 NOTE — ED Triage Notes (Signed)
 Patient was seen here 2 days ago for lower back pain.  Patient states that the medicine is not helping her pain.

## 2024-04-11 NOTE — Discharge Instructions (Addendum)
 Finish the prednisone  taper that you were previously prescribed and continue to use your tizanidine  to help with muscle pain or spasm.  You may also continue to take the pain medication prescribed by your pain management specialist.  I have included a handout on physical therapy exercises that you can perform to see if this improves your pain in your low back.  If the physical therapy, steroids, and muscle laxer's do not improve your pain I would recommend that you follow-up with the spine specialist as you may need additional imaging of your spine and possibly surgery.

## 2024-04-14 ENCOUNTER — Ambulatory Visit: Payer: Self-pay | Admitting: Anesthesiology

## 2024-04-14 ENCOUNTER — Other Ambulatory Visit: Payer: Self-pay

## 2024-04-14 ENCOUNTER — Ambulatory Visit
Admission: RE | Admit: 2024-04-14 | Discharge: 2024-04-14 | Disposition: A | Discharge status: Home / Self Care | Attending: Gastroenterology | Admitting: Gastroenterology

## 2024-04-14 ENCOUNTER — Encounter
Admission: RE | Disposition: A | Discharge status: Home / Self Care | Payer: Self-pay | Source: Home / Self Care | Attending: Gastroenterology

## 2024-04-14 ENCOUNTER — Encounter: Payer: Self-pay | Admitting: Gastroenterology

## 2024-04-14 DIAGNOSIS — Z7985 Long-term (current) use of injectable non-insulin antidiabetic drugs: Secondary | ICD-10-CM | POA: Diagnosis not present

## 2024-04-14 DIAGNOSIS — G4730 Sleep apnea, unspecified: Secondary | ICD-10-CM | POA: Insufficient documentation

## 2024-04-14 DIAGNOSIS — Z79899 Other long term (current) drug therapy: Secondary | ICD-10-CM | POA: Diagnosis not present

## 2024-04-14 DIAGNOSIS — Z860100 Personal history of colon polyps, unspecified: Secondary | ICD-10-CM | POA: Diagnosis present

## 2024-04-14 DIAGNOSIS — Z1211 Encounter for screening for malignant neoplasm of colon: Secondary | ICD-10-CM | POA: Insufficient documentation

## 2024-04-14 DIAGNOSIS — Z7984 Long term (current) use of oral hypoglycemic drugs: Secondary | ICD-10-CM | POA: Insufficient documentation

## 2024-04-14 DIAGNOSIS — Z6841 Body Mass Index (BMI) 40.0 and over, adult: Secondary | ICD-10-CM | POA: Diagnosis not present

## 2024-04-14 DIAGNOSIS — K5730 Diverticulosis of large intestine without perforation or abscess without bleeding: Secondary | ICD-10-CM | POA: Diagnosis not present

## 2024-04-14 DIAGNOSIS — M1990 Unspecified osteoarthritis, unspecified site: Secondary | ICD-10-CM | POA: Diagnosis not present

## 2024-04-14 DIAGNOSIS — Z7952 Long term (current) use of systemic steroids: Secondary | ICD-10-CM | POA: Insufficient documentation

## 2024-04-14 DIAGNOSIS — R0602 Shortness of breath: Secondary | ICD-10-CM | POA: Insufficient documentation

## 2024-04-14 DIAGNOSIS — I209 Angina pectoris, unspecified: Secondary | ICD-10-CM | POA: Insufficient documentation

## 2024-04-14 DIAGNOSIS — Z860101 Personal history of adenomatous and serrated colon polyps: Secondary | ICD-10-CM | POA: Insufficient documentation

## 2024-04-14 DIAGNOSIS — Z800 Family history of malignant neoplasm of digestive organs: Secondary | ICD-10-CM | POA: Diagnosis present

## 2024-04-14 DIAGNOSIS — E119 Type 2 diabetes mellitus without complications: Secondary | ICD-10-CM | POA: Diagnosis not present

## 2024-04-14 DIAGNOSIS — F32A Depression, unspecified: Secondary | ICD-10-CM | POA: Insufficient documentation

## 2024-04-14 DIAGNOSIS — I10 Essential (primary) hypertension: Secondary | ICD-10-CM | POA: Insufficient documentation

## 2024-04-14 DIAGNOSIS — F419 Anxiety disorder, unspecified: Secondary | ICD-10-CM | POA: Insufficient documentation

## 2024-04-14 HISTORY — PX: COLONOSCOPY: SHX5424

## 2024-04-14 HISTORY — DX: Obstructive sleep apnea (adult) (pediatric): G47.33

## 2024-04-14 SURGERY — COLONOSCOPY
Anesthesia: General | Site: Rectum

## 2024-04-14 MED ORDER — LACTATED RINGERS IV SOLN: INTRAVENOUS | Status: DC

## 2024-04-14 MED ORDER — LIDOCAINE HCL (CARDIAC) PF 100 MG/5ML IV SOSY
PREFILLED_SYRINGE | INTRAVENOUS | Status: DC | PRN
  Administered 2024-04-14: 60 mg via INTRAVENOUS

## 2024-04-14 MED ORDER — STERILE WATER FOR IRRIGATION IR SOLN
Status: DC | PRN
  Administered 2024-04-14: 500 mL

## 2024-04-14 MED ORDER — STERILE WATER FOR IRRIGATION IR SOLN
Status: DC | PRN
  Administered 2024-04-14 (×3): 60 mL

## 2024-04-14 MED ORDER — ONDANSETRON HCL 4 MG/2ML IJ SOLN
INTRAMUSCULAR | Status: DC | PRN
  Administered 2024-04-14: 4 mg via INTRAVENOUS

## 2024-04-14 MED ORDER — PROPOFOL 10 MG/ML IV BOLUS
INTRAVENOUS | Status: DC | PRN
  Administered 2024-04-14 (×2): 50 mg via INTRAVENOUS
  Administered 2024-04-14: 150 ug/kg/min via INTRAVENOUS
  Administered 2024-04-14: 50 mg via INTRAVENOUS

## 2024-04-14 SURGICAL SUPPLY — 16 items
CLIP HMST 235XBRD CATH ROT (MISCELLANEOUS) IMPLANT
ELECTRODE REM PT RTRN 9FT ADLT (ELECTROSURGICAL) IMPLANT
FORCEPS BIOP RAD 4 LRG CAP 4 (CUTTING FORCEPS) IMPLANT
FORCEPS ESCP3.2XJMB 240X2.8X (MISCELLANEOUS) IMPLANT
GAUZE SPONGE 4X4 12PLY STRL (GAUZE/BANDAGES/DRESSINGS) IMPLANT
GOWN CVR UNV OPN BCK APRN NK (MISCELLANEOUS) ×2 IMPLANT
INJECTOR VARIJECT VIN23 (MISCELLANEOUS) IMPLANT
KIT DEFENDO VALVE AND CONN (KITS) IMPLANT
KIT PROCEDURE OLYMPUS (MISCELLANEOUS) ×1 IMPLANT
MANIFOLD NEPTUNE II (INSTRUMENTS) ×1 IMPLANT
MARKER SPOT ENDO TATTOO 5ML (MISCELLANEOUS) IMPLANT
PROBE APC STR FIRE (PROBE) IMPLANT
RETRIEVER NET ROTH 2.5X230 LF (MISCELLANEOUS) IMPLANT
SNARE COLD EXACTO (MISCELLANEOUS) IMPLANT
TRAP ETRAP POLY (MISCELLANEOUS) IMPLANT
WATER STERILE IRR 250ML POUR (IV SOLUTION) ×1 IMPLANT

## 2024-04-14 NOTE — Anesthesia Postprocedure Evaluation (Signed)
 Anesthesia Post Note  Patient: Candace Clayton  Procedure(s) Performed: COLONOSCOPY (Rectum)  Patient location during evaluation: PACU Anesthesia Type: General Level of consciousness: awake and alert Pain management: pain level controlled Vital Signs Assessment: post-procedure vital signs reviewed and stable Respiratory status: spontaneous breathing, nonlabored ventilation, respiratory function stable and patient connected to nasal cannula oxygen Cardiovascular status: blood pressure returned to baseline and stable Postop Assessment: no apparent nausea or vomiting Anesthetic complications: no   No notable events documented.   Last Vitals:  Vitals:   04/14/24 0928 04/14/24 0937  BP: 118/61 (!) 142/75  Pulse: 63 (!) 59  Resp: 15 19  Temp: (!) 36.3 C (!) 36.3 C  SpO2: 99% 100%    Last Pain:  Vitals:   04/14/24 0937  TempSrc:   PainSc: 0-No pain                 Candace Clayton

## 2024-04-14 NOTE — Transfer of Care (Signed)
 Immediate Anesthesia Transfer of Care Note  Patient: Candace Clayton  Procedure(s) Performed: COLONOSCOPY (Rectum)  Patient Location: PACU  Anesthesia Type: General  Level of Consciousness: awake, alert  and patient cooperative  Airway and Oxygen Therapy: Patient Spontanous Breathing and Patient connected to supplemental oxygen  Post-op Assessment: Post-op Vital signs reviewed, Patient's Cardiovascular Status Stable, Respiratory Function Stable, Patent Airway and No signs of Nausea or vomiting  Post-op Vital Signs: Reviewed and stable  Complications: No notable events documented.

## 2024-04-14 NOTE — H&P (Signed)
 Corinn JONELLE Brooklyn, MD [facility], DHIP [address]  [place], [place] [address]  Main: [phone] Fax:  [phone] Pager: [phone]   Primary Care Physician:  Liana Fish, NP Primary Gastroenterologist:  Dr. Corinn JONELLE Brooklyn  Pre-Procedure History & Physical: HPI:  Candace Clayton is a 63 y.o. female is here for an colonoscopy.   Past Medical History:  Diagnosis Date   Acute calculous cholecystitis 02/11/2020   Acute gallstone pancreatitis 10/27/2020   Acute non-recurrent pansinusitis 05/17/2020   Anginal pain    tightness related to anxiety   Anxiety    Cholecystolithiasis    Chronic pain syndrome    Depression    Diabetes mellitus without complication (HCC)    Generalized anxiety disorder    Hair loss disorder    History of C-section 1992   Hypertension    Lumbar facet arthropathy    Morbid obesity with BMI of 45.0-49.9, adult (HCC)    Nystagmus    Pneumonia of both lower lobes due to infectious organism 02/18/2020   Sleep apnea    uses cpap   Vertigo     Past Surgical History:  Procedure Laterality Date   CESAREAN SECTION  1992   CHOLECYSTECTOMY  10/26/2020   COLONOSCOPY     polyps removed first procedure. 2nd time all was clear   COLONOSCOPY WITH PROPOFOL  N/A 03/14/2019   Procedure: COLONOSCOPY WITH PROPOFOL ;  Surgeon: [place] Corinn Skiff, MD;  Location: [facility] ENDOSCOPY;  Service: Gastroenterology;  Laterality: N/A;   FINGER SURGERY Left 2011   left finger cut off x 2.(only up to last digit)   HEMATOMA EVACUATION Left 1999   upper part of foot was injured d/t 500lb weight landing on her foot.    LAPAROSCOPIC SALPINGO OOPHERECTOMY Left 04/02/2018   Procedure: LAPAROSCOPIC SALPINGO OOPHORECTOMY;  Surgeon: Lake Read, MD;  Location: [facility] ORS;  Service: Gynecology;  Laterality: Left;    Prior to Admission medications   Medication Sig Start Date End Date Taking? Authorizing Provider  ALPRAZolam  (XANAX ) 0.5 MG tablet  Take 0.5 mg by mouth at bedtime as needed for anxiety.   Yes [provider]  amLODipine  (NORVASC ) 10 MG tablet Take 1 tablet (10 mg total) by mouth daily. 03/10/24  Yes Abernathy, Fish, NP  busPIRone  (BUSPAR ) 15 MG tablet Take 1 tablet (15 mg total) by mouth 3 (three) times daily. For anxiety 12/10/23  Yes Abernathy, Alyssa, NP  chlorthalidone  (HYGROTON ) 25 MG tablet Take 1 tablet (25 mg total) by mouth daily. 03/10/24  Yes Abernathy, Fish, NP  desvenlafaxine  (PRISTIQ ) 100 MG 24 hr tablet Take 1 tablet (100 mg total) by mouth daily. 12/10/23  Yes Abernathy, Fish, NP  enalapril  (VASOTEC ) 20 MG tablet Take 20 mg by mouth daily.   Yes [provider]  hydrochlorothiazide  (HYDRODIURIL ) 25 MG tablet Take 1 tablet (25 mg total) by mouth daily. 02/18/24  Yes Abernathy, Fish, NP  methocarbamol  (ROBAXIN ) 750 MG tablet Take 1 tablet (750 mg total) by mouth every 8 (eight) hours as needed for muscle spasms. 01/25/23  Yes Marcelino Nurse, MD  metoprolol  succinate (TOPROL -XL) 100 MG 24 hr tablet TAKE 1 TABLET BY MOUTH EVERY DAY WITH OR IMMEDIATELY FOLLOWING A MEAL 12/10/23  Yes Abernathy, Alyssa, NP  minoxidil  (LONITEN ) 10 MG tablet Take 1 tablet (10 mg total) by mouth daily. 12/10/23  Yes Abernathy, Fish, NP  Multiple Vitamins-Calcium  (ONE-A-DAY WOMENS PO) Take 1 tablet by mouth daily.   Yes [provider]  oxyCODONE -acetaminophen  (PERCOCET/ROXICET) 5-325 MG  tablet Take 1 tablet by mouth every 8 (eight) hours as needed for severe pain (pain score 7-10). 04/25/24 05/25/24 Yes Patel, Seema K, NP  predniSONE  (STERAPRED UNI-PAK 21 TAB) 10 MG (21) TBPK tablet Take by mouth daily. Take 6 tabs by mouth daily  for 1 day, then 5 tabs for 1 day, then 4 tabs for 1 day, then 3 tabs for 1 day, 2 tabs for 1 day, then 1 tab by mouth daily for 1 days 04/09/24  Yes Mayer, Jodi R, NP  pregabalin  (LYRICA ) 100 MG capsule 100 mg qAM, 200 mg qhs 04/01/24  Yes Patel, Seema K, NP  rosuvastatin  (CRESTOR ) 5 MG  tablet Take 1 tablet (5 mg total) by mouth daily. 12/10/23  Yes Abernathy, Alyssa, NP  Semaglutide , 2 MG/DOSE, (OZEMPIC , 2 MG/DOSE,) 8 MG/3ML SOPN INJECT 2 MG INTO THE SKIN ONCE A WEEK. 02/18/24  Yes Abernathy, Alyssa, NP  tiZANidine  (ZANAFLEX ) 4 MG capsule Take 4 mg by mouth 3 (three) times daily.   Yes [provider]  Vitamin D , Ergocalciferol , (DRISDOL ) 1.25 MG (50000 UNIT) CAPS capsule TAKE 1 CAPSULE (50,000 UNITS TOTAL) BY MOUTH EVERY 7 (SEVEN) DAYS 03/11/24  Yes Abernathy, Mardy, NP  cyanocobalamin  (VITAMIN B12) 1000 MCG tablet Take 1 tablet (1,000 mcg total) by mouth daily. Patient taking differently: Take 1,000 mcg by mouth once a week. 06/14/23   Abernathy, Mardy, NP  glucose blood (CONTOUR NEXT TEST) test strip 1 each by Other route daily. DX E11.65 07/26/20   Arloa Waddell RAMAN, NP  metFORMIN  (GLUCOPHAGE ) 500 MG tablet Take 1 tablet (500 mg total) by mouth 2 (two) times daily with a meal. 12/10/23   Liana Mardy, NP  Microlet Lancets MISC 1 each by Does not apply route daily. DX-E11.65 09/22/19   Hanford Powell BRAVO, NP  OVER THE COUNTER MEDICATION 1 tablet daily as needed. Takes generic stool softener for constipation d/t oxycodone .     [provider]  oxyCODONE -acetaminophen  (PERCOCET/ROXICET) 5-325 MG tablet Take 1 tablet by mouth every 8 (eight) hours as needed for severe pain (pain score 7-10). 05/25/24 06/24/24  Patel, Seema K, NP  oxyCODONE -acetaminophen  (PERCOCET/ROXICET) 5-325 MG tablet Take 1 tablet by mouth every 8 (eight) hours as needed for severe pain (pain score 7-10). 06/24/24 07/24/24  Patel, Seema K, NP    Allergies as of 04/07/2024   (No Known Allergies)    Family History  Problem Relation Age of Onset   Breast cancer Mother 40       x 3 times   Colon cancer Mother    Brain cancer Mother    Cancer - Colon Mother    Cancer - Other Mother    Heart Problems Father    Colon cancer Brother    Heart Problems Brother     Social History    Socioeconomic History   Marital status: Widowed    Spouse name: Not on file   Number of children: 1   Years of education: Not on file   Highest education level: Not on file  Occupational History   Occupation: works in Programme researcher, broadcasting/film/video  Tobacco Use   Smoking status: Never   Smokeless tobacco: Never  Vaping Use   Vaping status: Never Used  Substance and Sexual Activity   Alcohol use: No   Drug use: No   Sexual activity: Not Currently    Birth control/protection: Post-menopausal  Other Topics Concern   Not on file  Social History Narrative   Son has schizophrenia and autism but  is fully capable of helping mother after surgery   Social Drivers of Corporate investment banker Strain: Not on file  Food Insecurity: Not on file  Transportation Needs: Not on file  Physical Activity: Not on file  Stress: Not on file  Social Connections: Not on file  Intimate Partner Violence: Not on file    Review of Systems: See HPI, otherwise negative ROS  Physical Exam: BP (!) 143/68   Temp 97.9 F (36.6 C) (Temporal)   Resp 15   Ht 5' 9.02 (1.753 m)   Wt 133.9 kg   SpO2 97%   BMI 43.57 kg/m  General:   Alert,  pleasant and cooperative in NAD Head:  Normocephalic and atraumatic. Neck:  Supple; no masses or thyromegaly. Lungs:  Clear throughout to auscultation.    Heart:  Regular rate and rhythm. Abdomen:  Soft, nontender and nondistended. Normal bowel sounds, without guarding, and without rebound.   Neurologic:  Alert and  oriented x4;  grossly normal neurologically.  Impression/Plan: Candace Clayton is here for an colonoscopy to be performed for personal history of adenomatous colon polyps in 2020 and family history of colon cancer in mother and a brother in their 78s   Risks, benefits, limitations, and alternatives regarding  colonoscopy have been reviewed with the patient.  Questions have been answered.  All parties agreeable.   Corinn Brooklyn, MD  04/14/2024, 8:15 AM

## 2024-04-14 NOTE — Op Note (Signed)
 [facility]enter[facility] Patient Name: Candace Clayton Procedure Date: 04/14/2024 9:00 AM MRN: [id number] Account #: [id number] Date of Birth: 12-Jan-1961 Admit Type: Outpatient Age: 63 Room: [facility] OR ROOM 01 Gender: Female Note Status: Finalized Instrument Name: Arvis [id number] Procedure:             Colonoscopy Indications:           Screening in patient at increased risk: Family history                         of 1st-degree relative with colorectal cancer before                         age 103 years, Surveillance: Personal history of                         adenomatous polyps on last colonoscopy 5 years ago,                         Last colonoscopy: September 2020 Providers:             Corinn Jess Brooklyn MD, MD Referring MD:          Mardy Maxin (Referring MD) Medicines:             General Anesthesia Complications:         No immediate complications. Estimated blood loss: None. Procedure:             Pre-Anesthesia Assessment:                        - Prior to the procedure, a History and Physical was                         performed, and patient medications and allergies were                         reviewed. The patient is competent. The risks and                         benefits of the procedure and the sedation options and                         risks were discussed with the patient. All questions                         were answered and informed consent was obtained.                         Patient identification and proposed procedure were                         verified by the physician, the nurse, the                         anesthesiologist, the anesthetist and the technician                         in the pre-procedure area in the procedure room in the  endoscopy suite. Mental Status Examination: alert and                         oriented. Airway Examination: normal oropharyngeal                         airway and  neck mobility. Respiratory Examination:                         clear to auscultation. CV Examination: normal.                         Prophylactic Antibiotics: The patient does not require                         prophylactic antibiotics. Prior Anticoagulants: The                         patient has taken no anticoagulant or antiplatelet                         agents. ASA Grade Assessment: III - A patient with                         severe systemic disease. After reviewing the risks and                         benefits, the patient was deemed in satisfactory                         condition to undergo the procedure. The anesthesia                         plan was to use general anesthesia. Immediately prior                         to administration of medications, the patient was                         re-assessed for adequacy to receive sedatives. The                         heart rate, respiratory rate, oxygen saturations,                         blood pressure, adequacy of pulmonary ventilation, and                         response to care were monitored throughout the                         procedure. The physical status of the patient was                         re-assessed after the procedure.                        After obtaining informed consent, the colonoscope was  passed under direct vision. Throughout the procedure,                         the patient's blood pressure, pulse, and oxygen                         saturations were monitored continuously. The                         Colonoscope was introduced through the anus and                         advanced to the the cecum, identified by appendiceal                         orifice and ileocecal valve. The colonoscopy was                         performed with moderate difficulty due to significant                         looping and the patient's body habitus. Successful                          completion of the procedure was aided by applying                         abdominal pressure. The patient tolerated the                         procedure well. The quality of the bowel preparation                         was evaluated using the BBPS [facility] Bowel Preparation                         Scale) with scores of: Right Colon = 3, Transverse                         Colon = 3 and Left Colon = 3 (entire mucosa seen well                         with no residual staining, small fragments of stool or                         opaque liquid). The total BBPS score equals 9. The                         ileocecal valve, appendiceal orifice, and rectum were                         photographed. Findings:      The perianal and digital rectal examinations were normal. Pertinent       negatives include normal sphincter tone and no palpable rectal lesions.      The colon (entire examined portion) appeared normal.      Multiple small-mouthed diverticula were found in the sigmoid colon.  The retroflexed view of the distal rectum and anal verge was normal and       showed no anal or rectal abnormalities. Impression:            - The entire examined colon is normal.                        - Diverticulosis in the sigmoid colon.                        - The distal rectum and anal verge are normal on                         retroflexion view.                        - No specimens collected. Recommendation:        - Discharge patient to home (with escort).                        - Resume previous diet daily.                        - Repeat colonoscopy in 5 years for screening purposes.                        - Continue present medications. Procedure Code(s):     --- Professional ---                        H9894, Colorectal cancer screening; colonoscopy on                         individual at high risk Diagnosis Code(s):     --- Professional ---                        Z80.0, Family history of  malignant neoplasm of                         digestive organs                        Z86.010, Personal history of colonic polyps                        K57.30, Diverticulosis of large intestine without                         perforation or abscess without bleeding CPT copyright 2022 [employer]. All rights reserved. The codes documented in this report are preliminary and upon coder review may  be revised to meet current compliance requirements. Dr. Corinn Brooklyn Corinn Jess Brooklyn MD, MD 04/14/2024 9:25:51 AM This report has been signed electronically. Number of Addenda: 0 Note Initiated On: 04/14/2024 9:00 AM Scope Withdrawal Time: 0 hours 8 minutes 40 seconds  Total Procedure Duration: 0 hours 12 minutes 21 seconds  Estimated Blood Loss:  Estimated blood loss: none.      [facility]

## 2024-04-14 NOTE — Anesthesia Preprocedure Evaluation (Signed)
 Anesthesia Evaluation  Patient identified by MRN, date of birth, ID band Patient awake    Reviewed: Allergy & Precautions, H&P , NPO status , Patient's Chart, lab work & pertinent test results  Airway Mallampati: III   Neck ROM: Full    Dental no notable dental hx.    Pulmonary neg pulmonary ROS, shortness of breath, sleep apnea , pneumonia   Pulmonary exam normal breath sounds clear to auscultation       Cardiovascular hypertension, + angina  negative cardio ROS Normal cardiovascular exam Rhythm:Regular Rate:Normal     Neuro/Psych  PSYCHIATRIC DISORDERS Anxiety Depression     Neuromuscular disease negative neurological ROS  negative psych ROS   GI/Hepatic negative GI ROS, Neg liver ROS,,,  Endo/Other  negative endocrine ROSdiabetes    Renal/GU Renal diseasenegative Renal ROS  negative genitourinary   Musculoskeletal negative musculoskeletal ROS (+) Arthritis ,    Abdominal   Peds negative pediatric ROS (+)  Hematology negative hematology ROS (+)   Anesthesia Other Findings History of C-section  Depression Hypertension  Anxiety Anginal pain  Sleep apnea Diabetes mellitus without complication (HCC) Cholecystolithiasis Pneumonia of both lower lobes due to infectious organism Acute non-recurrent pansinusitis Acute gallstone pancreatitis  Acute calculous cholecystitis Chronic pain syndrome  Lumbar facet arthropathy Generalized anxiety disorder  Vertigo Nystagmus  Hair loss disorder Morbid obesity with BMI of 45.0-49.9, adult (HCC)  OSA on CPAP, very compliant with CPAP, states, I love my CPAP; it really works.    Reproductive/Obstetrics negative OB ROS                              Anesthesia Physical Anesthesia Plan  ASA: 3  Anesthesia Plan: General   Post-op Pain Management:    Induction: Intravenous  PONV Risk Score and Plan:   Airway Management Planned: Natural  Airway and Nasal Cannula  Additional Equipment:   Intra-op Plan:   Post-operative Plan:   Informed Consent: I have reviewed the patients History and Physical, chart, labs and discussed the procedure including the risks, benefits and alternatives for the proposed anesthesia with the patient or authorized representative who has indicated his/her understanding and acceptance.     Dental Advisory Given  Plan Discussed with: Anesthesiologist, CRNA and Surgeon  Anesthesia Plan Comments: (Patient consented for risks of anesthesia including but not limited to:  - adverse reactions to medications - risk of airway placement if required - damage to eyes, teeth, lips or other oral mucosa - nerve damage due to positioning  - sore throat or hoarseness - Damage to heart, brain, nerves, lungs, other parts of body or loss of life  Patient voiced understanding and assent.)         Anesthesia Quick Evaluation

## 2024-05-01 ENCOUNTER — Ambulatory Visit: Admitting: General Surgery

## 2024-05-05 ENCOUNTER — Ambulatory Visit: Admitting: Student in an Organized Health Care Education/Training Program

## 2024-05-07 ENCOUNTER — Ambulatory Visit
Admission: RE | Admit: 2024-05-07 | Discharge: 2024-05-07 | Disposition: A | Discharge status: Home / Self Care | Source: Ambulatory Visit | Attending: Student in an Organized Health Care Education/Training Program | Admitting: Student in an Organized Health Care Education/Training Program

## 2024-05-07 ENCOUNTER — Encounter: Payer: Self-pay | Admitting: Student in an Organized Health Care Education/Training Program

## 2024-05-07 ENCOUNTER — Ambulatory Visit (HOSPITAL_BASED_OUTPATIENT_CLINIC_OR_DEPARTMENT_OTHER): Admitting: Student in an Organized Health Care Education/Training Program

## 2024-05-07 DIAGNOSIS — M47816 Spondylosis without myelopathy or radiculopathy, lumbar region: Secondary | ICD-10-CM | POA: Insufficient documentation

## 2024-05-07 MED ORDER — DEXAMETHASONE SOD PHOSPHATE PF 10 MG/ML IJ SOLN
20.0000 mg | Freq: Once | INTRAMUSCULAR | Status: AC
  Administered 2024-05-07: 20 mg

## 2024-05-07 MED ORDER — ROPIVACAINE HCL 2 MG/ML IJ SOLN
18.0000 mL | Freq: Once | INTRAMUSCULAR | Status: AC
  Administered 2024-05-07: 18 mL via PERINEURAL
  Filled 2024-05-07: qty 20

## 2024-05-07 MED ORDER — DIAZEPAM 5 MG PO TABS
ORAL_TABLET | ORAL | Status: AC
  Filled 2024-05-07: qty 2

## 2024-05-07 MED ORDER — MIDAZOLAM HCL (PF) 2 MG/2ML IJ SOLN
0.5000 mg | Freq: Once | INTRAMUSCULAR | Status: DC
  Administered 2024-05-07: 2 mg via INTRAVENOUS

## 2024-05-07 MED ORDER — LACTATED RINGERS IV SOLN: Freq: Once | INTRAVENOUS | Status: DC

## 2024-05-07 MED ORDER — LIDOCAINE HCL 2 % IJ SOLN
20.0000 mL | Freq: Once | INTRAMUSCULAR | Status: AC
  Administered 2024-05-07: 400 mg
  Filled 2024-05-07: qty 40

## 2024-05-07 MED ORDER — DIAZEPAM 5 MG PO TABS
10.0000 mg | ORAL_TABLET | ORAL | Status: AC
  Administered 2024-05-07: 10 mg via ORAL

## 2024-05-07 NOTE — Patient Instructions (Signed)

## 2024-05-07 NOTE — Progress Notes (Signed)
 PROVIDER NOTE: Information contained herein reflects review and annotations entered in association with encounter. Interpretation of such information and data should be left to medically-trained personnel. Information provided to patient can be located elsewhere in the medical record under Patient Instructions. Document created using STT-dictation technology, any transcriptional errors that may result from process are unintentional.    Patient: Candace Clayton  Service Category: Procedure  Provider: Wallie Sherry, MD  DOB: 1961/06/03  DOS: 05/07/2024  Location: [facility]  MRN: [id number]  Setting: Ambulatory - outpatient  Referring Provider: Tobie Emmy POUR, NP  Type: Established Patient  Specialty: Interventional Pain Management  PCP: Liana Fish, NP   Primary Reason for Visit: Interventional Pain Management Treatment. CC: Back Pain (low) and Knee Pain (Bilateral,right is worse)     Procedure:          Anesthesia, Analgesia, Anxiolysis:  Type: Thermal Lumbar Facet, Medial Branch Radiofrequency Ablation/Neurotomy           Primary Purpose: Therapeutic Region: Posterolateral Lumbosacral Spine Level: L3, L4, L5, Medial Branch Level(s). These levels will denervate the L3-4, L4-5, lumbar facet joints. Laterality: RIGHT & LEFT  Type: Local Anesthesia  Local Anesthetic: Lidocaine  1-2% Anxiolysis: P.o. Valium  10 mg     Position: Prone   Indications: 1. Lumbar facet arthropathy    2. Lumbar spondylosis    Candace Clayton has been dealing with the above chronic pain for longer than three months and has either failed to respond, was unable to tolerate, or simply did not get enough benefit from other more conservative therapies including, but not limited to: 1. Over-the-counter medications 2. Anti-inflammatory medications 3. Muscle relaxants 4. Membrane stabilizers 5. Opioids 6. Physical therapy and/or chiropractic manipulation 7. Modalities (Heat, ice, etc.) 8. Invasive  techniques such as nerve blocks. Candace Clayton has attained more than 50% relief of the pain from a series of diagnostic injections conducted in separate occasions.  Pain Score: Pre-procedure: 5 /10 Post-procedure: 5 /10    Pre-op H&P Assessment:  Candace Clayton is a 63 y.o. (year old), female patient, seen today for interventional treatment. She  has a past surgical history that includes Cesarean section (1992); Finger surgery (Left, 2011); Hematoma evacuation (Left, 1999); Colonoscopy; Laparoscopic salpingo oophorectomy (Left, 04/02/2018); Colonoscopy with propofol  (N/A, 03/14/2019); Cholecystectomy (10/26/2020); and Colonoscopy (N/A, 04/14/2024). Candace Clayton has a current medication list which includes the following prescription(s): alprazolam , amlodipine , buspirone , chlorthalidone , cyanocobalamin , desvenlafaxine , enalapril , contour next test, hydrochlorothiazide , metformin , methocarbamol , metoprolol  succinate, microlet lancets, minoxidil , multiple vitamins-minerals, OVER THE COUNTER MEDICATION, oxycodone -acetaminophen , [START ON 05/25/2024] oxycodone -acetaminophen , [START ON 06/24/2024] oxycodone -acetaminophen , pregabalin , rosuvastatin , ozempic  (2 mg/dose), tizanidine , vitamin d  (ergocalciferol ), and prednisone , and the following Facility-Administered Medications: lactated ringers . Her primarily concern today is the Back Pain (low) and Knee Pain (Bilateral,right is worse)   Initial Vital Signs:  Pulse/HCG Rate: 61ECG Heart Rate: 64 Temp: 97.7 F (36.5 C) Resp: 20 BP: 126/65 SpO2: 97 %  BMI: Estimated body mass index is 43.56 kg/m as calculated from the following:   Height as of this encounter: 5' 9 (1.753 m).   Weight as of this encounter: 295 lb (133.8 kg).  Risk Assessment: Allergies: Reviewed. She has no known allergies.  Allergy Precautions: None required Coagulopathies: Reviewed. None identified.  Blood-thinner therapy: None at this time Active Infection(s): Reviewed. None  identified. Candace Clayton is afebrile  Site Confirmation: Candace Clayton was asked to confirm the procedure and laterality before marking the site Procedure checklist: Completed Consent: Before the procedure and under the influence of  no sedative(s), amnesic(s), or anxiolytics, the patient was informed of the treatment options, risks and possible complications. To fulfill our ethical and legal obligations, as recommended by the American Medical Association's Code of Ethics, I have informed the patient of my clinical impression; the nature and purpose of the treatment or procedure; the risks, benefits, and possible complications of the intervention; the alternatives, including doing nothing; the risk(s) and benefit(s) of the alternative treatment(s) or procedure(s); and the risk(s) and benefit(s) of doing nothing. The patient was provided information about the general risks and possible complications associated with the procedure. These may include, but are not limited to: failure to achieve desired goals, infection, bleeding, organ or nerve damage, allergic reactions, paralysis, and death. In addition, the patient was informed of those risks and complications associated to Spine-related procedures, such as failure to decrease pain; infection (i.e.: Meningitis, epidural or intraspinal abscess); bleeding (i.e.: epidural hematoma, subarachnoid hemorrhage, or any other type of intraspinal or peri-dural bleeding); organ or nerve damage (i.e.: Any type of peripheral nerve, nerve root, or spinal cord injury) with subsequent damage to sensory, motor, and/or autonomic systems, resulting in permanent pain, numbness, and/or weakness of one or several areas of the body; allergic reactions; (i.e.: anaphylactic reaction); and/or death. Furthermore, the patient was informed of those risks and complications associated with the medications. These include, but are not limited to: allergic reactions (i.e.: anaphylactic or  anaphylactoid reaction(s)); adrenal axis suppression; blood sugar elevation that in diabetics may result in ketoacidosis or comma; water retention that in patients with history of congestive heart failure may result in shortness of breath, pulmonary edema, and decompensation with resultant heart failure; weight gain; swelling or edema; medication-induced neural toxicity; particulate matter embolism and blood vessel occlusion with resultant organ, and/or nervous system infarction; and/or aseptic necrosis of one or more joints. Finally, the patient was informed that Medicine is not an exact science; therefore, there is also the possibility of unforeseen or unpredictable risks and/or possible complications that may result in a catastrophic outcome. The patient indicated having understood very clearly. We have given the patient no guarantees and we have made no promises. Enough time was given to the patient to ask questions, all of which were answered to the patient's satisfaction. Candace Clayton has indicated that she wanted to continue with the procedure. Attestation: I, the ordering provider, attest that I have discussed with the patient the benefits, risks, side-effects, alternatives, likelihood of achieving goals, and potential problems during recovery for the procedure that I have provided informed consent. Date  Time: 05/07/2024  8:21 AM  Pre-Procedure Preparation:  Monitoring: As per clinic protocol. Respiration, ETCO2, SpO2, BP, heart rate and rhythm monitor placed and checked for adequate function Safety Precautions: Patient was assessed for positional comfort and pressure points before starting the procedure. Time-out: I initiated and conducted the Time-out before starting the procedure, as per protocol. The patient was asked to participate by confirming the accuracy of the Time Out information. Verification of the correct person, site, and procedure were performed and confirmed by me, the nursing  staff, and the patient. Time-out conducted as per Joint Commission's Universal Protocol (UP.01.01.01). Time: 1003  Description of Procedure:          Laterality: Right & LEFT Levels: L3, L4, L5, Medial Branch Level(s), at the L3-4, L4-5 umbar facet joints. Area Prepped: Lumbosacral DuraPrep (Iodine Povacrylex [0.7% available iodine] and Isopropyl Alcohol, 74% w/w) Safety Precautions: Aspiration looking for blood return was conducted prior to all injections. At no  point did we inject any substances, as a needle was being advanced. Before injecting, the patient was told to immediately notify me if she was experiencing any new onset of ringing in the ears, or metallic taste in the mouth. No attempts were made at seeking any paresthesias. Safe injection practices and needle disposal techniques used. Medications properly checked for expiration dates. SDV (single dose vial) medications used. After the completion of the procedure, all disposable equipment used was discarded in the proper designated medical waste containers. Local Anesthesia: Protocol guidelines were followed. The patient was positioned over the fluoroscopy table. The area was prepped in the usual manner. The time-out was completed. The target area was identified using fluoroscopy. A 12-in long, straight, sterile hemostat was used with fluoroscopic guidance to locate the targets for each level blocked. Once located, the skin was marked with an approved surgical skin marker. Once all sites were marked, the skin (epidermis, dermis, and hypodermis), as well as deeper tissues (fat, connective tissue and muscle) were infiltrated with a small amount of a short-acting local anesthetic, loaded on a 10cc syringe with a 25G, 1.5-in  Needle. An appropriate amount of time was allowed for local anesthetics to take effect before proceeding to the next step. Local Anesthetic: Lidocaine  2.0% The unused portion of the local anesthetic was discarded in the  proper designated containers. Technical explanation of process:  Radiofrequency Ablation (RFA) L3 Medial Branch Nerve RFA: The target area for the L3 medial branch is at the junction of the postero-lateral aspect of the superior articular process and the superior, posterior, and medial edge of the transverse process of L4. Under fluoroscopic guidance, a Radiofrequency needle was inserted until contact was made with os over the superior postero-lateral aspect of the pedicular shadow (target area). Sensory and motor testing was conducted to properly adjust the position of the needle. Once satisfactory placement of the needle was achieved, the numbing solution was slowly injected after negative aspiration for blood. 2.0 mL of the nerve block solution was injected without difficulty or complication. After waiting for at least 3 minutes, the ablation was performed. Once completed, the needle was removed intact. L4 Medial Branch Nerve RFA: The target area for the L4 medial branch is at the junction of the postero-lateral aspect of the superior articular process and the superior, posterior, and medial edge of the transverse process of L5. Under fluoroscopic guidance, a Radiofrequency needle was inserted until contact was made with os over the superior postero-lateral aspect of the pedicular shadow (target area). Sensory and motor testing was conducted to properly adjust the position of the needle. Once satisfactory placement of the needle was achieved, the numbing solution was slowly injected after negative aspiration for blood. 2.0 mL of the nerve block solution was injected without difficulty or complication. After waiting for at least 3 minutes, the ablation was performed. Once completed, the needle was removed intact. L5 Medial Branch Nerve RFA: The target area for the L5 medial branch is at the junction of the postero-lateral aspect of the superior articular process of S1 and the superior, posterior, and medial  edge of the sacral ala. Under fluoroscopic guidance, a Radiofrequency needle was inserted until contact was made with os over the superior postero-lateral aspect of the pedicular shadow (target area). Sensory and motor testing was conducted to properly adjust the position of the needle. Once satisfactory placement of the needle was achieved, the numbing solution was slowly injected after negative aspiration for blood. 2.0 mL of the nerve block  solution was injected without difficulty or complication. After waiting for at least 3 minutes, the ablation was performed. Once completed, the needle was removed intact.  Radiofrequency lesioning (ablation):  Radiofrequency Generator: NeuroTherm [id number] Sensory Stimulation Parameters: 50 Hz was used to locate & identify the nerve, making sure that the needle was positioned such that there was no sensory stimulation below 0.3 V or above 0.7 V. Motor Stimulation Parameters: 2 Hz was used to evaluate the motor component. Care was taken not to lesion any nerves that demonstrated motor stimulation of the lower extremities at an output of less than 2.5 times that of the sensory threshold, or a maximum of 2.0 V. Lesioning Technique Parameters: Standard Radiofrequency settings. (Not bipolar or pulsed.) Temperature Settings: 80 degrees C Lesioning time: 60 seconds Intra-operative Compliance: Compliant Materials & Medications: Needle(s) (Electrode/Cannula) Type: Teflon-coated, curved tip, Radiofrequency needle(s) Gauge: 22G Length: 10cm Numbing solution:12 cc solution made of 10cc of 0.2% ropivacaine , 2 cc of Decadron  10 mg/cc. 2cc injected at each level above on RIGHT & LEFT after sensorimotor testing prior to lesioning   Once the entire procedure was completed, the treated area was cleaned, making sure to leave some of the prepping solution back to take advantage of its long term bactericidal properties.    Illustration of the posterior view of the lumbar spine and  the posterior neural structures. Laminae of L2 through S1 are labeled. DPRL5, dorsal primary ramus of L5; DPRS1, dorsal primary ramus of S1; DPR3, dorsal primary ramus of L3; FJ, facet (zygapophyseal) joint L3-L4; I, inferior articular process of L4; LB1, lateral branch of dorsal primary ramus of L1; IAB, inferior articular branches from L3 medial branch (supplies L4-L5 facet joint); IBP, intermediate branch plexus; MB3, medial branch of dorsal primary ramus of L3; NR3, third lumbar nerve root; S, superior articular process of L5; SAB, superior articular branches from L4 (supplies L4-5 facet joint also); TP3, transverse process of L3.  Vitals:   05/07/24 1010 05/07/24 1015 05/07/24 1020 05/07/24 1025  BP: (!) 146/85 (!) 143/77 (!) 152/82 (!) 141/90  Pulse:      Resp: 16 15 16 16   Temp:      SpO2: 100% 98% 97% 98%  Weight:      Height:       Start Time: 1003 hrs. End Time: 1021 hrs.  Imaging Guidance (Spinal):          Type of Imaging Technique: Fluoroscopy Guidance (Spinal) Indication(s): Assistance in needle guidance and placement for procedures requiring needle placement in or near specific anatomical locations not easily accessible without such assistance. Exposure Time: Please see nurses notes. Contrast: None used. Fluoroscopic Guidance: I was personally present during the use of fluoroscopy. Tunnel Vision Technique used to obtain the best possible view of the target area. Parallax error corrected before commencing the procedure. Direction-depth-direction technique used to introduce the needle under continuous pulsed fluoroscopy. Once target was reached, antero-posterior, oblique, and lateral fluoroscopic projection used confirm needle placement in all planes. Images permanently stored in EMR. Interpretation: No contrast injected. I personally interpreted the imaging intraoperatively. Adequate needle placement confirmed in multiple planes. Permanent images saved into the patient's  record.   Post-operative Assessment:  Post-procedure Vital Signs:  Pulse/HCG Rate: 6165 Temp: 97.7 F (36.5 C) Resp: 16 BP: (!) 141/90 SpO2: 98 %  EBL: None  Complications: No immediate post-treatment complications observed by team, or reported by patient.  Note: The patient tolerated the entire procedure well. A repeat set of vitals were taken after  the procedure and the patient was kept under observation following institutional policy, for this type of procedure. Post-procedural neurological assessment was performed, showing return to baseline, prior to discharge. The patient was provided with post-procedure discharge instructions, including a section on how to identify potential problems. Should any problems arise concerning this procedure, the patient was given instructions to immediately contact us , at any time, without hesitation. In any case, we plan to contact the patient by telephone for a follow-up status report regarding this interventional procedure.  Comments:  No additional relevant information.  Plan of Care  Orders:  Orders Placed This Encounter  Procedures   DG PAIN CL[facility] C-ARM 1-60 MIN NO REPORT    Intraoperative interpretation by procedural physician at [facility].    Standing Status:   Standing    Number of Occurrences:   1    Reason for exam::   Assistance in needle guidance and placement for procedures requiring needle placement in or near specific anatomical locations not easily accessible without such assistance.   Chronic Opioid Analgesic:  Percocet 5 mg 3 times daily as needed, quantity 90/month; MME equals 22.5    Medications ordered for procedure: Meds ordered this encounter  Medications   lidocaine  (XYLOCAINE ) 2 % (with pres) injection 400 mg   lactated ringers  infusion   DISCONTD: midazolam  PF (VERSED ) injection 0.5-2 mg    Make sure Flumazenil is available in the pyxis when using this medication. If oversedation occurs, administer 0.2  mg IV over 15 sec. If after 45 sec no response, administer 0.2 mg again over 1 min; may repeat at 1 min intervals; not to exceed 4 doses (1 mg)   ropivacaine  (PF) 2 mg/mL (0.2%) (NAROPIN ) injection 18 mL   dexamethasone  (DECADRON ) injection 20 mg   diazepam  (VALIUM ) tablet 10 mg    Make sure Flumazenil is available in the pyxis when using this medication. If oversedation occurs, administer 0.2 mg IV over 15 sec. If after 45 sec no response, administer 0.2 mg again over 1 min; may repeat at 1 min intervals; not to exceed 4 doses (1 mg)   Medications administered: We administered lidocaine , ropivacaine  (PF) 2 mg/mL (0.2%), dexamethasone , and diazepam .  See the medical record for exact dosing, route, and time of administration.  Follow-up plan:   Return for Keep sch. appt.      Recent Visits Date Type Provider Dept  04/01/24 Office Visit Patel, Seema K, NP [facility]  Showing recent visits within past 90 days and meeting all other requirements Today's Visits Date Type Provider Dept  05/07/24 Procedure visit Marcelino Nurse, MD [facility]  Showing today's visits and meeting all other requirements Future Appointments Date Type Provider Dept  06/25/24 Appointment Patel, Seema K, NP [facility]  Showing future appointments within next 90 days and meeting all other requirements  Disposition: Discharge home  Discharge (Date  Time): 05/07/2024; 1028 hrs.   Primary Care Physician: Liana Fish, NP Location: [facility] Note by: Nurse Marcelino, MD Date: 05/07/2024; Time: 10:47 AM  Disclaimer:  Medicine is not an exact science. The only guarantee in medicine is that nothing is guaranteed. It is important to note that the decision to proceed with this intervention was based on the information collected from the patient. The Data and conclusions were drawn from the patient's questionnaire, the interview, and the physical examination.  Because the information was provided in large part by the patient, it cannot be guaranteed that it  has not been purposely or unconsciously manipulated. Every effort has been made to obtain as much relevant data as possible for this evaluation. It is important to note that the conclusions that lead to this procedure are derived in large part from the available data. Always take into account that the treatment will also be dependent on availability of resources and existing treatment guidelines, considered by other Pain Management Practitioners as being common knowledge and practice, at the time of the intervention. For Medico-Legal purposes, it is also important to point out that variation in procedural techniques and pharmacological choices are the acceptable norm. The indications, contraindications, technique, and results of the above procedure should only be interpreted and judged by a Board-Certified Interventional Pain Specialist with extensive familiarity and expertise in the same exact procedure and technique.

## 2024-05-07 NOTE — Progress Notes (Signed)
 Started procedure in room 1 approx 1000r/t fluro machine not working in room 2.

## 2024-05-08 ENCOUNTER — Telehealth: Payer: Self-pay | Admitting: *Deleted

## 2024-05-08 NOTE — Telephone Encounter (Signed)
 Post procedure call; voicemail left

## 2024-05-13 ENCOUNTER — Encounter: Payer: Self-pay | Admitting: General Surgery

## 2024-05-13 ENCOUNTER — Ambulatory Visit: Admitting: General Surgery

## 2024-05-13 VITALS — BP 127/76 | HR 70 | Ht 69.0 in | Wt 294.0 lb

## 2024-05-13 DIAGNOSIS — K439 Ventral hernia without obstruction or gangrene: Secondary | ICD-10-CM

## 2024-05-13 DIAGNOSIS — K432 Incisional hernia without obstruction or gangrene: Secondary | ICD-10-CM

## 2024-05-13 NOTE — Patient Instructions (Signed)
 Follow-up with our office as needed.  Please call and ask to speak with a nurse if you develop questions or concerns.    Ventral Hernia A ventral hernia (also called an incisional hernia) is a hernia that occurs at the site of a previous surgical cut (incision) in the abdomen. The abdominal wall spans from your lower chest down to your pelvis. If the abdominal wall is weakened from a surgical incision, a hernia can occur. A hernia is a bulge of bowel or muscle tissue pushing out on the weakened part of the abdominal wall. Ventral hernias can get bigger from straining or lifting. Obese and older people are at higher risk for a ventral hernia. People who develop infections after surgery or require repeat incisions at the same site on the abdomen are also at increased risk.  SEEK MEDICAL CARE IF:  Your hernia seems to be getting larger or more painful. SEEK IMMEDIATE MEDICAL CARE IF:  You have abdominal pain that is sudden and sharp. Your pain becomes severe. You have repeated vomiting. You are sweating a lot. You notice a rapid heartbeat. You develop a fever. MAKE SURE YOU:  Understand these instructions. Will watch your condition. Will get help right away if you are not doing well or get worse.

## 2024-05-13 NOTE — Progress Notes (Signed)
 Outpatient Surgical Follow Up  05/13/2024  Candace Clayton is an 63 y.o. female.   Chief Complaint  Patient presents with   New Patient (Initial Visit)    Hernia    HPI: Candace Clayton returns today to discuss her ventral hernia.  She was seen in September in consultation with my partner for the ventral hernia.  At that time she had no obstructive symptoms.  She continues to report normal bowel movements and she is tolerating a diet.  She does not have much pain at the site of her hernias.  She has tried to lose weight but is having difficulty doing this.  Past Medical History:  Diagnosis Date   Acute calculous cholecystitis 02/11/2020   Acute gallstone pancreatitis 10/27/2020   Acute non-recurrent pansinusitis 05/17/2020   Anginal pain    tightness related to anxiety   Anxiety    Cholecystolithiasis    Chronic pain syndrome    Depression    Diabetes mellitus without complication (HCC)    Generalized anxiety disorder    Hair loss disorder    History of C-section 1992   Hypertension    Lumbar facet arthropathy    Morbid obesity with BMI of 45.0-49.9, adult (HCC)    Nystagmus    OSA on CPAP    Pneumonia of both lower lobes due to infectious organism 02/18/2020   Sleep apnea    uses cpap   Vertigo     Past Surgical History:  Procedure Laterality Date   CESAREAN SECTION  1992   CHOLECYSTECTOMY  10/26/2020   COLONOSCOPY     polyps removed first procedure. 2nd time all was clear   COLONOSCOPY N/A 04/14/2024   Procedure: COLONOSCOPY;  Surgeon: Unk Corinn Skiff, MD;  Location: [facility];  Service: Endoscopy;  Laterality: N/A;   COLONOSCOPY WITH PROPOFOL  N/A 03/14/2019   Procedure: COLONOSCOPY WITH PROPOFOL ;  Surgeon: Unk Corinn Skiff, MD;  Location: [facility];  Service: Gastroenterology;  Laterality: N/A;   FINGER SURGERY Left 2011   left finger cut off x 2.(only up to last digit)   HEMATOMA EVACUATION Left 1999   upper part of foot was injured d/t 500lb  weight landing on her foot.    LAPAROSCOPIC SALPINGO OOPHERECTOMY Left 04/02/2018   Procedure: LAPAROSCOPIC SALPINGO OOPHORECTOMY;  Surgeon: Lake Read, MD;  Location: [facility];  Service: Gynecology;  Laterality: Left;    Family History  Problem Relation Age of Onset   Breast cancer Mother 7       x 3 times   Colon cancer Mother    Brain cancer Mother    Cancer - Colon Mother    Cancer - Other Mother    Heart Problems Father    Colon cancer Brother    Heart Problems Brother     Social History:  reports that she has never smoked. She has never been exposed to tobacco smoke. She has never used smokeless tobacco. She reports that she does not drink alcohol and does not use drugs.  Allergies: No Known Allergies  Medications reviewed.    ROS Full ROS performed and is otherwise negative other than what is stated in HPI   BP 127/76   Pulse 70   Ht 5' 9 (1.753 m)   Wt 294 lb (133.4 kg)   SpO2 98%   BMI 43.42 kg/m   Physical Exam Abdomen is obese Laparoscopic port sites have healed well without any erythema, due to her body habitus it is very difficult to appreciate  any hernias and there is no overlying skin changes.    CT scan reviewed and she does have ventral hernias without evidence of obstruction  Assessment/Plan:  Patient with ventral hernia.  I discussed with her that given her BMI the risk of recurrence is too high.  She is not having any symptoms from her hernia and certainly no signs of obstruction.  I discussed the signs and symptoms of obstruction, incarceration and strangulation.  These would prompt her to go to the emergency department immediately.  Otherwise encouraged her to speak with her primary care physician to discuss health the habits and weight loss.  She can return to our office in 6 months.  A total of 25 minutes was spent reviewing the patient's chart, performing history and physical and discussing treatment options with the patient  Jayson Endow, M.D. [facility]

## 2024-06-17 ENCOUNTER — Ambulatory Visit: Admitting: General Surgery

## 2024-06-21 ENCOUNTER — Other Ambulatory Visit: Payer: Self-pay | Admitting: Nurse Practitioner

## 2024-06-21 DIAGNOSIS — F411 Generalized anxiety disorder: Secondary | ICD-10-CM

## 2024-06-21 DIAGNOSIS — Z0001 Encounter for general adult medical examination with abnormal findings: Secondary | ICD-10-CM

## 2024-06-21 DIAGNOSIS — I152 Hypertension secondary to endocrine disorders: Secondary | ICD-10-CM

## 2024-06-22 NOTE — Progress Notes (Unsigned)
 PROVIDER NOTE: Interpretation of information contained herein should be left to medically-trained personnel. Specific patient instructions are provided elsewhere under Patient Instructions section of medical record. This document was created in part using AI and STT-dictation technology, any transcriptional errors that may result from this process are unintentional.  Patient: Candace Clayton  Service: E/M   PCP: Liana Fish, NP  DOB: [birth year]  DOS: 06/24/2024  Provider: Emmy MARLA Blanch, NP  MRN: [id number]  Delivery: Face-to-face  Specialty: Interventional Pain Management  Type: Established Patient  Setting: Ambulatory outpatient facility  Specialty designation: 09  Referring Prov.: Liana Fish, NP  Location: Outpatient office facility       History of present illness (HPI) Candace Clayton, a 63 y.o. year old female, is here today because of her low back pain and right hip pain. Candace Clayton primary complain today is Back Pain and right hip pain.  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  Pain Assessment: Severity of Chronic pain is reported as a 6 /10. Location: Back Right, Lower/Radiates down  right hip and Right knee. Onset: More than a month ago. Quality: Burning. Timing: Intermittent. Modifying factor(s): Rest. Vitals:  height is 5' 9 (1.753 m) and weight is 295 lb (133.8 kg). Her temporal temperature is 97.3 F (36.3 C) (abnormal). Her blood pressure is 153/83 (abnormal) and her pulse is 77. Her respiration is 18 and oxygen saturation is 100%.  BMI: Estimated body mass index is 43.56 kg/m as calculated from the following:   Height as of this encounter: 5' 9 (1.753 m).   Weight as of this encounter: 295 lb (133.8 kg).  Last encounter: 04/01/2024. Last procedure: 05/07/2024.  Reason for encounter: both, medication management and post-procedure evaluation and assessment. No change in medical history since last visit.  Patient's pain is at baseline.  Patient continues multimodal pain regimen as prescribed.  States that it provides pain relief and improvement in functional status.  Candace Clayton underwent a therapeutic thermal lumbar facet radiofrequency ablation (RFA) on May 07, 2024.  Candace Clayton reports initially 90% pain relief and functional improvement during local anesthetic phase, followed by sustained ongoing 100% pain relief and functional improvement since the procedure.  Discussed the use of AI scribe software for clinical note transcription with the patient, who gave verbal consent to proceed.  History of Present Illness   Candace Clayton is a 63 year old female who presents with hip pain following a fall.  Candace Clayton describes the hip pain as a 'knot' in the area where Candace Clayton fell, which has worsened since the incident. The pain significantly affects her mobility and daily activities. No imaging studies, such as an x-ray, have been performed on her hip since the fall.  Candace Clayton has a history of a procedure that was effective until the fall. Candace Clayton is currently not working and plans to return  to work on January 5th, allowing her some time to recover. Candace Clayton has a history of diabetes and has  previously experienced a fall resulting in three broken ribs. Candace Clayton has difficulty moving and needs to be cautious to avoid further injury.  Her current medications include pregabalin  (Lyrica ) and Percocet. Candace Clayton prefers to maintain pregabalin  in her system to avoid adverse effects. Candace Clayton is unsure about her current refill status for these medications and plans to verify with the pharmacy. No allergies to medications and denies any side effects from Lyrica , such as drowsiness.     Procedure Procedure:           Anesthesia, Analgesia, Anxiolysis:  Type: Thermal Lumbar Facet, Medial Branch Radiofrequency Ablation/Neurotomy           Primary Purpose: Therapeutic Region: Posterolateral Lumbosacral Spine Level: L3, L4, L5, Medial Branch Level(s). These levels will denervate the L3-4, L4-5, lumbar facet joints. Laterality: RIGHT & LEFT   Type: Local Anesthesia  Local Anesthetic: Lidocaine  1-2% Anxiolysis: P.o. Valium  10 mg        Position: Prone    Indications: 1. Lumbar facet arthropathy    2. Lumbar spondylosis     Candace Clayton has been dealing with the above chronic pain for longer than three months and has either failed to respond, was unable to tolerate, or simply did not get enough benefit from other more conservative therapies including, but not limited to: 1. Over-the-counter medications 2. Anti-inflammatory medications 3. Muscle relaxants 4. Membrane stabilizers 5. Opioids 6. Physical therapy and/or chiropractic manipulation 7. Modalities (Heat, ice, etc.) 8. Invasive techniques such as nerve blocks. Candace Clayton has attained more than 50% relief of the pain from a series of diagnostic injections conducted in separate occasions.   Pain Score: Pre-procedure: 5 /10 Post-procedure: 5 /10  Post-Procedure Evaluation    Effectiveness:  Initial hour after procedure: 90 % . Subsequent 4-6 hours post-procedure: 90 % . Analgesia past initial 6 hours: 100 % . Ongoing improvement:   Analgesic:  100 % . Function: Candace Clayton reports improvement in function ROM: Candace Clayton reports improvement in ROM Interpretation: Candace Clayton underwent a therapeutic thermal lumbar facet radiofrequency ablation (RFA) on May 07, 2024.  Candace Clayton reports initially 90% pain relief and functional improvement during local anesthetic phase, followed by sustained ongoing 100% pain relief and functional improvement since the procedure. Pharmacotherapy Assessment   Oxycodone -acetaminophen  (Percocet/Roxicet) 5-325 mg every 8 hours as needed for severe pain. MME=22.50  Pregabalin  (Lyrica ) 100 Mg capsules every morning, 200 Mg capsules nightly Monitoring: [place] PMP: PDMP reviewed during this encounter.       Pharmacotherapy: No side-effects or adverse reactions reported. Compliance: No problems identified. Effectiveness: Clinically acceptable.  Candace Clayton, [place]  06/24/2024  1:00 PM  Sign when Signing Visit Nursing Pain Medication Assessment:  Safety precautions to be maintained throughout the outpatient stay will include: orient to surroundings, keep bed in low position, maintain call bell within reach at all times, provide assistance with transfer out of bed and ambulation.  Medication Inspection Compliance: Ms. Harb did not comply with our request to bring her pills to be counted. Candace Clayton was reminded that bringing the medication bottles, even when empty, is a requirement.  Medication: None brought in. Pill/Patch Count: None available to be counted. Bottle Appearance: No container available. Did not bring bottle(s) to appointment. Filled Date: N/A Last Medication intake:  Today    UDS:  Summary  Date Value Ref Range Status  01/08/2024 FINAL  Final  Comment:    ==================================================================== [employer] Select 13 (MW) ==================================================================== Test                             Result       Flag        Units  Drug Present and Declared for Prescription Verification   Oxycodone                       1044         EXPECTED   ng/mg creat   Oxymorphone                    638          EXPECTED   ng/mg creat   Noroxycodone                   1569         EXPECTED   ng/mg creat   Noroxymorphone                 350          EXPECTED   ng/mg creat    Sources of oxycodone  are scheduled prescription medications.    Oxymorphone, noroxycodone, and noroxymorphone are expected    metabolites of oxycodone . Oxymorphone is also available as a    scheduled prescription medication.  ==================================================================== Test                      Result    Flag   Units      Ref Range   Creatinine              176              mg/dL      >=79 ==================================================================== Declared Medications:  The flagging and interpretation on this report are based on the  following declared medications.  Unexpected results may arise from  inaccuracies in the declared medications.   **Note: The testing scope of this panel includes these medications:   Oxycodone  (Roxicodone )  Oxycodone  (Percocet)   **Note: The testing scope of this panel does not include the  following reported medications:   Acetaminophen  (Percocet)  Albuterol  (Ventolin  HFA)  Amlodipine  (Norvasc )  Budesonide  (Breztri  Aerosphere)  Buspirone  (Buspar )  Chlorthalidone  (Hygroton )  Desvenlafaxine  (Pristiq )  Docusate (Stool Softener)  Fluticasone  (Flonase )  Formoterol  (Breztri  Aerosphere)  Glycopyrrolate (Breztri  Aerosphere)  Hydrochlorothiazide  (Hydrodiuril )  Metformin  (Glucophage )  Methocarbamol  (Robaxin )  Metoprolol  (Toprol )  Minoxidil  (Loniten )  Multivitamin  Pregabalin  (Lyrica )  Rosuvastatin  (Crestor )  Semaglutide  (Ozempic )  Vitamin B12  Vitamin D2 (Drisdol ) ==================================================================== For clinical consultation, please call [phone]. ====================================================================     No results found for: CBDTHCR No results found for: D8THCCBX No results found for: D9THCCBX  ROS  Constitutional: Denies any fever or chills Gastrointestinal: No reported hemesis, hematochezia, vomiting, or acute GI distress Musculoskeletal: low back pain and right hip pain Neurological: No reported episodes of acute onset apraxia, aphasia, dysarthria, agnosia, amnesia, paralysis, loss of coordination, or loss of consciousness  Medication Review  ALPRAZolam , Microlet Lancets, Multiple Vitamins-Minerals, OVER THE COUNTER MEDICATION, Semaglutide  (2 MG/DOSE), Vitamin D  (Ergocalciferol ), amLODipine , busPIRone , chlorthalidone , cyanocobalamin , desvenlafaxine , enalapril , glucose blood, hydrochlorothiazide , metFORMIN , methocarbamol , metoprolol  succinate, minoxidil , oxyCODONE -acetaminophen , pregabalin , rosuvastatin , and tiZANidine   History Review  Allergy: Candace Clayton has no known allergies. Drug: Candace Clayton  reports no history of drug use. Alcohol:  reports no history of  alcohol use. Tobacco:  reports that Candace Clayton has never smoked. Candace Clayton has never been exposed to tobacco smoke. Candace Clayton has never used smokeless tobacco. Social: Candace Clayton  reports that Candace Clayton has never smoked. Candace Clayton has never been exposed to tobacco smoke. Candace Clayton has never used smokeless tobacco. Candace Clayton reports that Candace Clayton does not drink alcohol and does not use drugs. Medical:  has a past medical history of Acute calculous cholecystitis (02/11/2020), Acute gallstone pancreatitis (10/27/2020), Acute non-recurrent pansinusitis (05/17/2020), Anginal pain, Anxiety, Cholecystolithiasis, Chronic pain syndrome, Depression, Diabetes mellitus without complication (HCC), Generalized anxiety disorder, Hair loss disorder, History of C-section (1992), Hypertension, Lumbar facet arthropathy, Morbid obesity with BMI of 45.0-49.9, adult (HCC), Nystagmus, OSA on CPAP, Pneumonia of  both lower lobes due to infectious organism (02/18/2020), Sleep apnea, and Vertigo. Surgical: Candace Clayton  has a past surgical history that includes Cesarean section (1992); Finger surgery (Left, 2011); Hematoma evacuation (Left, 1999); Colonoscopy; Laparoscopic salpingo oophorectomy (Left, 04/02/2018); Colonoscopy with propofol  (N/A, 03/14/2019); Cholecystectomy (10/26/2020); and Colonoscopy (N/A, 04/14/2024). Family: family history includes Brain cancer in her mother; Breast cancer (age of onset: 20) in her mother; Cancer - Colon in her mother; Cancer - Other in her mother; Colon cancer in her brother and mother; Heart Problems in her brother and father.  Laboratory Chemistry Profile   Renal Lab Results  Component Value Date   BUN 14 11/29/2023   CREATININE 0.85 11/29/2023   BCR 16 11/29/2023   GFRAA 106 02/23/2020   GFRNONAA >60 02/21/2022    Hepatic Lab Results  Component Value Date   AST 14 11/29/2023   ALT 12 11/29/2023   ALBUMIN 4.2 11/29/2023   ALKPHOS 85 11/29/2023   LIPASE 26 02/21/2022    Electrolytes Lab Results  Component Value Date   NA 140 11/29/2023   K 4.3 11/29/2023   CL 101 11/29/2023   CALCIUM  9.3 11/29/2023    Bone Lab Results  Component Value Date   VD25OH 34.7 11/29/2023    Inflammation (CRP: Acute Phase) (ESR: Chronic Phase) Lab Results  Component Value Date   CRP 2 05/24/2021   ESRSEDRATE 12 05/24/2021         Note: Above Lab results reviewed.  Recent Imaging Review  DG PAIN CLINIC C-ARM 1-60 MIN NO REPORT Fluoro was used, but no Radiologist interpretation will be provided.  Please refer to NOTES tab for provider progress note. Note: Reviewed        Physical Exam  Vitals: BP (!) 153/83 (BP Location: Right Arm, Patient Position: Sitting, Cuff Size: Normal)   Pulse 77   Temp (!) 97.3 F (36.3 C) (Temporal)   Resp 18   Ht 5' 9 (1.753 m)   Wt 295 lb (133.8 kg)   SpO2 100%   BMI 43.56 kg/m  BMI: Estimated body mass index is 43.56  kg/m as calculated from the following:   Height as of this encounter: 5' 9 (1.753 m).   Weight as of this encounter: 295 lb (133.8 kg). Ideal: Ideal body weight: 66.2 kg (145 lb 15.1 oz) Adjusted ideal body weight: 93.2 kg (205 lb 9.1 oz) General appearance: Well nourished, well developed, and well hydrated. In no apparent acute distress Mental status: Alert, oriented x 3 (person, place, & time)       Respiratory: No evidence of acute respiratory distress Eyes: PERLA  Musculoskeletal: +LBP Assessment   Diagnosis Status  1. Lumbar facet arthropathy    2. Lumbar spondylosis   3. Medication management   4. Chronic pain syndrome  5. Cervical radicular pain (right C5/6)   6. Chronic radicular lumbar pain (right)   7. Primary osteoarthritis of right knee   8. Patellofemoral arthralgia of right knee    Improved Improved Controlled   Updated Problems: No problems updated.   Plan of Care  Problem-specific:  Assessment and Plan    Chronic pain syndrome Managed with pregabalin  and Percocet without side effects. Discontinued methocarbamol . - Administered IM injection of Toradol  and methocarbamol  for acute pain relief. - Sent prescriptions for Percocet and pregabalin  to pharmacy. - Advised rest and use of ice or heating pad as needed. - Scheduled follow-up in three months.  Right hip pain Acute pain post-fall, described as a knot. No imaging post-fall. Diabetic with good kidney function, allowing Toradol  use. - Administered IM injection of Toradol  and methocarbamol  for pain relief. - Advised rest and avoidance of excessive movement. - Consider x-ray if pain persists or worsens.   Medication management: Patient's pain is controlled with oxycodone -acetaminophen  (Percocet) and pregabalin , will continue on current medication regimen.  Prescribing drug monitoring (PDMP) reviewed, findings consistent with the use of prescribed medication no evidence of narcotic misuse or abuse.  Urine  drug screening (UDS) up to date.  No side effects or adverse reaction reported to medication.  Schedule follow-up in 90 days for medication management.      Ms. BRISA AUTH has a current medication list which includes the following long-term medication(s): amlodipine , chlorthalidone , desvenlafaxine , hydrochlorothiazide , metformin , metoprolol  succinate, minoxidil , rosuvastatin , [START ON 07/24/2024] oxycodone -acetaminophen , [START ON 08/23/2024] oxycodone -acetaminophen , and pregabalin .  Pharmacotherapy (Medications Ordered): Meds ordered this encounter  Medications   oxyCODONE -acetaminophen  (PERCOCET/ROXICET) 5-325 MG tablet    Sig: Take 1 tablet by mouth every 8 (eight) hours as needed for severe pain (pain score 7-10).    Dispense:  90 tablet    Refill:  0    For chronic pain syndrome. To last for 30 days from fill date.   oxyCODONE -acetaminophen  (PERCOCET/ROXICET) 5-325 MG tablet    Sig: Take 1 tablet by mouth every 8 (eight) hours as needed for severe pain (pain score 7-10).    Dispense:  90 tablet    Refill:  0    For chronic pain syndrome. To last for 30 days from fill date.   pregabalin  (LYRICA ) 100 MG capsule    Sig: 100 mg qAM, 200 mg qhs    Dispense:  90 capsule    Refill:  5    Fill one day early if pharmacy is closed on scheduled refill date. May substitute for generic if available.   ketorolac  (TORADOL ) injection 60 mg   methocarbamol  (ROBAXIN ) injection 200 mg   Orders:  No orders of the defined types were placed in this encounter.       Return in about 3 months (around 09/22/2024) for (F2F), (MM), Emmy Blanch NP.    Recent Visits Date Type Provider Dept  05/07/24 Procedure visit Marcelino Nurse, MD [facility]  04/01/24 Office Visit Zeno Hickel K, NP [facility]  Showing recent visits within past 90 days and meeting all other requirements Today's Visits Date Type Provider Dept  06/24/24 Office Visit Sheritha Louis K, NP [facility]   Showing today's visits and meeting all other requirements Future Appointments Date Type Provider Dept  09/15/24 Appointment Tavita Eastham K, NP [facility]  Showing future appointments within next 90 days and meeting all other requirements  I discussed the assessment and treatment plan with the patient. The patient was provided  an opportunity to ask questions and all were answered. The patient agreed with the plan and demonstrated an understanding of the instructions.  Patient advised to call back or seek an in-person evaluation if the symptoms or condition worsens.  I personally spent a total of 30 minutes in the care of the patient today including preparing to see the patient, getting/reviewing separately obtained history, performing a medically appropriate exam/evaluation, counseling and educating, placing orders, referring and communicating with other health care professionals, documenting clinical information in the EHR, independently interpreting results, communicating results, and coordinating care.   Note by: Sonya Gunnoe K Gerlene Glassburn, NP (TTS and AI technology used. I apologize for any typographical errors that were not detected and corrected.) Date: 06/24/2024; Time: 1:53 PM

## 2024-06-24 ENCOUNTER — Ambulatory Visit: Attending: Nurse Practitioner | Admitting: Nurse Practitioner

## 2024-06-24 ENCOUNTER — Encounter: Payer: Self-pay | Admitting: Nurse Practitioner

## 2024-06-24 VITALS — BP 153/83 | HR 77 | Temp 97.3°F | Resp 18 | Ht 69.0 in | Wt 295.0 lb

## 2024-06-24 DIAGNOSIS — Z79899 Other long term (current) drug therapy: Secondary | ICD-10-CM | POA: Diagnosis not present

## 2024-06-24 DIAGNOSIS — M1711 Unilateral primary osteoarthritis, right knee: Secondary | ICD-10-CM | POA: Diagnosis not present

## 2024-06-24 DIAGNOSIS — M5416 Radiculopathy, lumbar region: Secondary | ICD-10-CM | POA: Diagnosis not present

## 2024-06-24 DIAGNOSIS — M25561 Pain in right knee: Secondary | ICD-10-CM | POA: Diagnosis not present

## 2024-06-24 DIAGNOSIS — M5412 Radiculopathy, cervical region: Secondary | ICD-10-CM | POA: Diagnosis not present

## 2024-06-24 DIAGNOSIS — G8929 Other chronic pain: Secondary | ICD-10-CM | POA: Diagnosis present

## 2024-06-24 DIAGNOSIS — M47816 Spondylosis without myelopathy or radiculopathy, lumbar region: Secondary | ICD-10-CM | POA: Insufficient documentation

## 2024-06-24 DIAGNOSIS — G894 Chronic pain syndrome: Secondary | ICD-10-CM | POA: Diagnosis not present

## 2024-06-24 MED ORDER — KETOROLAC TROMETHAMINE 60 MG/2ML IM SOLN
60.0000 mg | Freq: Once | INTRAMUSCULAR | Status: AC
  Administered 2024-06-24: 60 mg via INTRAMUSCULAR
  Filled 2024-06-24: qty 2

## 2024-06-24 MED ORDER — OXYCODONE-ACETAMINOPHEN 5-325 MG PO TABS: 1.0000 | ORAL_TABLET | Freq: Three times a day (TID) | ORAL | 0 refills | Status: AC | PRN

## 2024-06-24 MED ORDER — METHOCARBAMOL 1000 MG/10ML IJ SOLN
200.0000 mg | Freq: Once | INTRAMUSCULAR | Status: AC
  Administered 2024-06-24: 200 mg via INTRAMUSCULAR
  Filled 2024-06-24: qty 10

## 2024-06-24 MED ORDER — PREGABALIN 100 MG PO CAPS: ORAL_CAPSULE | ORAL | 5 refills | Status: AC

## 2024-06-24 NOTE — Patient Instructions (Signed)

## 2024-06-24 NOTE — Progress Notes (Signed)
Nursing Pain Medication Assessment:  Safety precautions to be maintained throughout the outpatient stay will include: orient to surroundings, keep bed in low position, maintain call bell within reach at all times, provide assistance with transfer out of bed and ambulation.  Medication Inspection Compliance: Ms. Mordan did not comply with our request to bring her pills to be counted. She was reminded that bringing the medication bottles, even when empty, is a requirement.  Medication: None brought in. Pill/Patch Count: None available to be counted. Bottle Appearance: No container available. Did not bring bottle(s) to appointment. Filled Date: N/A Last Medication intake:  Today  

## 2024-06-25 ENCOUNTER — Encounter: Admitting: Nurse Practitioner

## 2024-07-10 ENCOUNTER — Ambulatory Visit: Admitting: Nurse Practitioner

## 2024-07-16 ENCOUNTER — Other Ambulatory Visit: Payer: Self-pay | Admitting: Nurse Practitioner

## 2024-07-16 ENCOUNTER — Encounter: Payer: Self-pay | Admitting: Nurse Practitioner

## 2024-07-16 ENCOUNTER — Ambulatory Visit: Admitting: Nurse Practitioner

## 2024-07-16 VITALS — BP 122/70 | HR 76 | Temp 97.8°F | Resp 16 | Ht 69.0 in | Wt 306.0 lb

## 2024-07-16 DIAGNOSIS — E1169 Type 2 diabetes mellitus with other specified complication: Secondary | ICD-10-CM

## 2024-07-16 DIAGNOSIS — I152 Hypertension secondary to endocrine disorders: Secondary | ICD-10-CM | POA: Diagnosis not present

## 2024-07-16 DIAGNOSIS — E785 Hyperlipidemia, unspecified: Secondary | ICD-10-CM

## 2024-07-16 DIAGNOSIS — E119 Type 2 diabetes mellitus without complications: Secondary | ICD-10-CM | POA: Insufficient documentation

## 2024-07-16 DIAGNOSIS — F411 Generalized anxiety disorder: Secondary | ICD-10-CM

## 2024-07-16 DIAGNOSIS — E559 Vitamin D deficiency, unspecified: Secondary | ICD-10-CM | POA: Diagnosis not present

## 2024-07-16 DIAGNOSIS — E1159 Type 2 diabetes mellitus with other circulatory complications: Secondary | ICD-10-CM

## 2024-07-16 LAB — POCT GLYCOSYLATED HEMOGLOBIN (HGB A1C): Hemoglobin A1C: 7.2 % — AB (ref 4.0–5.6)

## 2024-07-16 MED ORDER — ROSUVASTATIN CALCIUM 5 MG PO TABS: 5.0000 mg | ORAL_TABLET | Freq: Every day | ORAL | 1 refills | Status: AC

## 2024-07-16 MED ORDER — VITAMIN D (ERGOCALCIFEROL) 1.25 MG (50000 UNIT) PO CAPS: 50000.0000 [IU] | ORAL_CAPSULE | ORAL | 1 refills | Status: AC

## 2024-07-16 MED ORDER — BUSPIRONE HCL 15 MG PO TABS: 15.0000 mg | ORAL_TABLET | Freq: Three times a day (TID) | ORAL | 1 refills | Status: AC

## 2024-07-16 MED ORDER — CHLORTHALIDONE 25 MG PO TABS: 25.0000 mg | ORAL_TABLET | Freq: Every day | ORAL | 1 refills | Status: AC

## 2024-07-16 MED ORDER — METFORMIN HCL 500 MG PO TABS: 500.0000 mg | ORAL_TABLET | Freq: Two times a day (BID) | ORAL | 1 refills | Status: AC

## 2024-07-16 MED ORDER — AMLODIPINE BESYLATE 10 MG PO TABS: 10.0000 mg | ORAL_TABLET | Freq: Every day | ORAL | 1 refills | Status: AC

## 2024-07-16 MED ORDER — MINOXIDIL 10 MG PO TABS: 10.0000 mg | ORAL_TABLET | Freq: Every day | ORAL | 2 refills | Status: AC

## 2024-07-16 MED ORDER — HYDROCHLOROTHIAZIDE 25 MG PO TABS: 25.0000 mg | ORAL_TABLET | Freq: Every day | ORAL | 1 refills | Status: AC

## 2024-07-16 MED ORDER — TIRZEPATIDE 7.5 MG/0.5ML ~~LOC~~ SOAJ: 7.5000 mg | SUBCUTANEOUS | 0 refills | Status: AC

## 2024-07-16 NOTE — Progress Notes (Signed)
 [facility] [address] [place], [place] [address]  Internal MEDICINE  Office Visit Note  Patient Name: Candace Clayton  [id number]  [id number]  Date of Service: 07/16/2024  Chief Complaint  Patient presents with   Depression   Diabetes   Hypertension   Follow-up    HPI Candace Clayton presents for a follow-up visit for diabetes, hypertension, high cholesterol, chronic pain and screenings.  Diabetes -- A1c is elevated at 7.2 which is slightly increased from September when her A1c was 7.0. currently taking ozempic  and metformin . She reports that she does eat more sweets than she thinks she should sometimes.  Due for pap smear Hypertension -- controlled with enalapril , metoprolol , minoxidil , chlorthalidone , hydrochlorothiazide , and amlodipine   High cholesterol -- taking rosuvastatin  daily.  Chronic pain -- sees pain management.    Current Medication: Outpatient Encounter Medications as of 07/16/2024  Medication Sig   tirzepatide  (MOUNJARO ) 7.5 MG/0.5ML Pen Inject 7.5 mg into the skin once a week.   amLODipine  (NORVASC ) 10 MG tablet Take 1 tablet (10 mg total) by mouth daily.   busPIRone  (BUSPAR ) 15 MG tablet Take 1 tablet (15 mg total) by mouth 3 (three) times daily. For anxiety   chlorthalidone  (HYGROTON ) 25 MG tablet Take 1 tablet (25 mg total) by mouth daily.   cyanocobalamin  (VITAMIN B12) 1000 MCG tablet Take 1 tablet (1,000 mcg total) by mouth daily. (Patient taking differently: Take 1,000 mcg by mouth once a week.)   desvenlafaxine  (PRISTIQ ) 100 MG 24 hr tablet TAKE 1 TABLET BY MOUTH EVERY DAY   enalapril  (VASOTEC ) 20 MG tablet Take 20 mg by mouth daily.   glucose blood (CONTOUR NEXT TEST) test strip 1 each by Other route daily. DX E11.65   hydrochlorothiazide  (HYDRODIURIL ) 25 MG tablet Take 1 tablet (25 mg total) by mouth daily.   metFORMIN  (GLUCOPHAGE ) 500 MG tablet Take 1 tablet (500 mg total) by mouth 2 (two) times daily with a meal.   methocarbamol  (ROBAXIN ) 750 MG  tablet Take 1 tablet (750 mg total) by mouth every 8 (eight) hours as needed for muscle spasms.   metoprolol  succinate (TOPROL -XL) 100 MG 24 hr tablet TAKE 1 TABLET BY MOUTH EVERY DAY WITH OR IMMEDIATELY FOLLOWING A MEAL   Microlet Lancets MISC 1 each by Does not apply route daily. DX-E11.65   minoxidil  (LONITEN ) 10 MG tablet Take 1 tablet (10 mg total) by mouth daily.   Multiple Vitamins-Calcium  (ONE-A-DAY WOMENS PO) Take 1 tablet by mouth daily.   OVER THE COUNTER MEDICATION 1 tablet daily as needed. Takes generic stool softener for constipation d/t oxycodone .    [START ON 07/24/2024] oxyCODONE -acetaminophen  (PERCOCET/ROXICET) 5-325 MG tablet Take 1 tablet by mouth every 8 (eight) hours as needed for severe pain (pain score 7-10).   [START ON 08/23/2024] oxyCODONE -acetaminophen  (PERCOCET/ROXICET) 5-325 MG tablet Take 1 tablet by mouth every 8 (eight) hours as needed for severe pain (pain score 7-10).   pregabalin  (LYRICA ) 100 MG capsule 100 mg qAM, 200 mg qhs   rosuvastatin  (CRESTOR ) 5 MG tablet Take 1 tablet (5 mg total) by mouth daily.   tiZANidine  (ZANAFLEX ) 4 MG capsule Take 4 mg by mouth 3 (three) times daily.   Vitamin D , Ergocalciferol , (DRISDOL ) 1.25 MG (50000 UNIT) CAPS capsule Take 1 capsule (50,000 Units total) by mouth every 7 (seven) days.   [DISCONTINUED] ALPRAZolam  (XANAX ) 0.5 MG tablet Take 0.5 mg by mouth at bedtime as needed for anxiety. (Patient not taking: Reported on 07/16/2024)   [DISCONTINUED] amLODipine  (NORVASC ) 10 MG tablet  Take 1 tablet (10 mg total) by mouth daily.   [DISCONTINUED] busPIRone  (BUSPAR ) 15 MG tablet Take 1 tablet (15 mg total) by mouth 3 (three) times daily. For anxiety   [DISCONTINUED] chlorthalidone  (HYGROTON ) 25 MG tablet Take 1 tablet (25 mg total) by mouth daily.   [DISCONTINUED] hydrochlorothiazide  (HYDRODIURIL ) 25 MG tablet Take 1 tablet (25 mg total) by mouth daily.   [DISCONTINUED] metFORMIN  (GLUCOPHAGE ) 500 MG tablet Take 1 tablet (500 mg total) by  mouth 2 (two) times daily with a meal.   [DISCONTINUED] minoxidil  (LONITEN ) 10 MG tablet Take 1 tablet (10 mg total) by mouth daily.   [DISCONTINUED] rosuvastatin  (CRESTOR ) 5 MG tablet Take 1 tablet (5 mg total) by mouth daily.   [DISCONTINUED] Semaglutide , 2 MG/DOSE, (OZEMPIC , 2 MG/DOSE,) 8 MG/3ML SOPN INJECT 2 MG INTO THE SKIN ONCE A WEEK.   [DISCONTINUED] Vitamin D , Ergocalciferol , (DRISDOL ) 1.25 MG (50000 UNIT) CAPS capsule TAKE 1 CAPSULE (50,000 UNITS TOTAL) BY MOUTH EVERY 7 (SEVEN) DAYS   No facility-administered encounter medications on file as of 07/16/2024.    Surgical History: Past Surgical History:  Procedure Laterality Date   CESAREAN SECTION  1992   CHOLECYSTECTOMY  10/26/2020   COLONOSCOPY     polyps removed first procedure. 2nd time all was clear   COLONOSCOPY N/A 04/14/2024   Procedure: COLONOSCOPY;  Surgeon: Unk Corinn Skiff, MD;  Location: [facility];  Service: Endoscopy;  Laterality: N/A;   COLONOSCOPY WITH PROPOFOL  N/A 03/14/2019   Procedure: COLONOSCOPY WITH PROPOFOL ;  Surgeon: Unk Corinn Skiff, MD;  Location: [facility];  Service: Gastroenterology;  Laterality: N/A;   FINGER SURGERY Left 2011   left finger cut off x 2.(only up to last digit)   HEMATOMA EVACUATION Left 1999   upper part of foot was injured d/t 500lb weight landing on her foot.    LAPAROSCOPIC SALPINGO OOPHERECTOMY Left 04/02/2018   Procedure: LAPAROSCOPIC SALPINGO OOPHORECTOMY;  Surgeon: Lake Read, MD;  Location: [facility];  Service: Gynecology;  Laterality: Left;    Medical History: Past Medical History:  Diagnosis Date   Acute calculous cholecystitis 02/11/2020   Acute gallstone pancreatitis 10/27/2020   Acute non-recurrent pansinusitis 05/17/2020   Anginal pain    tightness related to anxiety   Anxiety    Cholecystolithiasis    Chronic pain syndrome    Depression    Diabetes mellitus without complication (HCC)    Generalized anxiety disorder    Hair loss  disorder    History of C-section 1992   Hypertension    Lumbar facet arthropathy    Morbid obesity with BMI of 45.0-49.9, adult (HCC)    Nystagmus    OSA on CPAP    Pneumonia of both lower lobes due to infectious organism 02/18/2020   Sleep apnea    uses cpap   Vertigo     Family History: Family History  Problem Relation Age of Onset   Breast cancer Mother 40       x 3 times   Colon cancer Mother    Brain cancer Mother    Cancer - Colon Mother    Cancer - Other Mother    Heart Problems Father    Colon cancer Brother    Heart Problems Brother     Social History   Socioeconomic History   Marital status: Widowed    Spouse name: Not on file   Number of children: 1   Years of education: Not on file   Highest education level: Not on file  Occupational History   Occupation: works in programme researcher, broadcasting/film/video  Tobacco Use   Smoking status: Never    Passive exposure: Never   Smokeless tobacco: Never  Vaping Use   Vaping status: Never Used  Substance and Sexual Activity   Alcohol use: No   Drug use: No   Sexual activity: Not Currently    Birth control/protection: Post-menopausal  Other Topics Concern   Not on file  Social History Narrative   Son has schizophrenia and autism but is fully capable of helping mother after surgery   Social Drivers of Health   Tobacco Use: Low Risk (07/16/2024)   Patient History    Smoking Tobacco Use: Never    Smokeless Tobacco Use: Never    Passive Exposure: Never  Financial Resource Strain: Not on file  Food Insecurity: Not on file  Transportation Needs: Not on file  Physical Activity: Not on file  Stress: Not on file  Social Connections: Not on file  Intimate Partner Violence: Not on file  Depression (PHQ2-9): Low Risk (06/24/2024)   Depression (PHQ2-9)    PHQ-2 Score: 2  Alcohol Screen: Low Risk (11/28/2021)   Alcohol Screen    Last Alcohol Screening Score (AUDIT): 0  Housing: Not on file  Utilities: Not on file  Health Literacy:  Not on file      Review of Systems  Constitutional:  Positive for activity change (low energy) and fatigue. Negative for chills and unexpected weight change.  HENT:  Negative for congestion, sneezing and sore throat.   Respiratory: Negative.  Negative for cough, chest tightness, shortness of breath and wheezing.   Cardiovascular: Negative.  Negative for chest pain and palpitations.  Gastrointestinal:  Negative for abdominal pain, constipation, diarrhea, nausea and vomiting.  Musculoskeletal:  Positive for arthralgias and back pain. Negative for gait problem and neck pain.  Skin:  Negative for rash.  Psychiatric/Behavioral:  Negative for behavioral problems (Depression), sleep disturbance and suicidal ideas. The patient is not nervous/anxious.     Vital Signs: BP 122/70   Pulse 76   Temp 97.8 F (36.6 C)   Resp 16   Ht 5' 9 (1.753 m)   Wt (!) 306 lb (138.8 kg)   SpO2 94%   BMI 45.19 kg/m    Physical Exam Vitals reviewed.  Constitutional:      General: She is not in acute distress.    Appearance: Normal appearance. She is obese. She is not ill-appearing.  HENT:     Head: Normocephalic and atraumatic.  Eyes:     Pupils: Pupils are equal, round, and reactive to light.  Cardiovascular:     Rate and Rhythm: Normal rate and regular rhythm.  Pulmonary:     Effort: Pulmonary effort is normal. No respiratory distress.  Abdominal:     Hernia: A hernia is present. Hernia is present in the ventral area.  Neurological:     Mental Status: She is alert and oriented to person, place, and time.  Psychiatric:        Mood and Affect: Mood normal.        Behavior: Behavior normal.        Assessment/Plan: 1. Type 2 diabetes mellitus with other specified complication, without long-term current use of insulin (HCC) (Primary) Discontinue ozempic  and start mounjaro  as prescribed. Follow up in 7 weeks to evaluate effect of new medication. Continue metformin  as prescribed.  - POCT  glycosylated hemoglobin (Hb A1C) - tirzepatide  (MOUNJARO ) 7.5 MG/0.5ML Pen; Inject 7.5 mg into the skin  once a week.  Dispense: 6 mL; Refill: 0 - metFORMIN  (GLUCOPHAGE ) 500 MG tablet; Take 1 tablet (500 mg total) by mouth 2 (two) times daily with a meal.  Dispense: 180 tablet; Refill: 1 - Urine Microalbumin w/creat. ratio  2. Hypertension associated with type 2 diabetes mellitus (HCC) Stable, continue blood pressure medications as prescribed, see below - minoxidil  (LONITEN ) 10 MG tablet; Take 1 tablet (10 mg total) by mouth daily.  Dispense: 90 tablet; Refill: 2 - hydrochlorothiazide  (HYDRODIURIL ) 25 MG tablet; Take 1 tablet (25 mg total) by mouth daily.  Dispense: 90 tablet; Refill: 1 - chlorthalidone  (HYGROTON ) 25 MG tablet; Take 1 tablet (25 mg total) by mouth daily.  Dispense: 90 tablet; Refill: 1 - amLODipine  (NORVASC ) 10 MG tablet; Take 1 tablet (10 mg total) by mouth daily.  Dispense: 90 tablet; Refill: 1  3. Hyperlipidemia associated with type 2 diabetes mellitus (HCC) Continue rosuvastatin  as prescribed.  - rosuvastatin  (CRESTOR ) 5 MG tablet; Take 1 tablet (5 mg total) by mouth daily.  Dispense: 90 tablet; Refill: 1  4. Vitamin D  deficiency Continue weekly vitamin D  supplement as prescribed  - Vitamin D , Ergocalciferol , (DRISDOL ) 1.25 MG (50000 UNIT) CAPS capsule; Take 1 capsule (50,000 Units total) by mouth every 7 (seven) days.  Dispense: 12 capsule; Refill: 1  5. GAD (generalized anxiety disorder) Continue buspirone  as prescribed  - busPIRone  (BUSPAR ) 15 MG tablet; Take 1 tablet (15 mg total) by mouth 3 (three) times daily. For anxiety  Dispense: 270 tablet; Refill: 1   General Counseling: Blaze verbalizes understanding of the findings of todays visit and agrees with plan of treatment. I have discussed any further diagnostic evaluation that may be needed or ordered today. We also reviewed her medications today. she has been encouraged to call the office with any questions or  concerns that should arise related to todays visit.    Orders Placed This Encounter  Procedures   POCT glycosylated hemoglobin (Hb A1C)    Meds ordered this encounter  Medications   tirzepatide  (MOUNJARO ) 7.5 MG/0.5ML Pen    Sig: Inject 7.5 mg into the skin once a week.    Dispense:  6 mL    Refill:  0    Fill new script today. Discontinue ozempic .   rosuvastatin  (CRESTOR ) 5 MG tablet    Sig: Take 1 tablet (5 mg total) by mouth daily.    Dispense:  90 tablet    Refill:  1   minoxidil  (LONITEN ) 10 MG tablet    Sig: Take 1 tablet (10 mg total) by mouth daily.    Dispense:  90 tablet    Refill:  2   metFORMIN  (GLUCOPHAGE ) 500 MG tablet    Sig: Take 1 tablet (500 mg total) by mouth 2 (two) times daily with a meal.    Dispense:  180 tablet    Refill:  1   hydrochlorothiazide  (HYDRODIURIL ) 25 MG tablet    Sig: Take 1 tablet (25 mg total) by mouth daily.    Dispense:  90 tablet    Refill:  1   chlorthalidone  (HYGROTON ) 25 MG tablet    Sig: Take 1 tablet (25 mg total) by mouth daily.    Dispense:  90 tablet    Refill:  1   busPIRone  (BUSPAR ) 15 MG tablet    Sig: Take 1 tablet (15 mg total) by mouth 3 (three) times daily. For anxiety    Dispense:  270 tablet    Refill:  1   amLODipine  (NORVASC ) 10  MG tablet    Sig: Take 1 tablet (10 mg total) by mouth daily.    Dispense:  90 tablet    Refill:  1   Vitamin D , Ergocalciferol , (DRISDOL ) 1.25 MG (50000 UNIT) CAPS capsule    Sig: Take 1 capsule (50,000 Units total) by mouth every 7 (seven) days.    Dispense:  12 capsule    Refill:  1    Return in about 7 weeks (around 09/03/2024) for F/U, eval new med, Roshon Duell PCP.   Total time spent:30 Minutes Time spent includes review of chart, medications, test results, and follow up plan with the patient.   [place] Controlled Substance Database was reviewed by me.  This patient was seen by Mardy Maxin, FNP-C in collaboration with Dr. Sigrid Bathe as a part of collaborative care  agreement.   Triston Lisanti R. Maxin, MSN, FNP-C Internal medicine

## 2024-07-17 LAB — MICROALBUMIN / CREATININE URINE RATIO
Creatinine, Urine: 116.5 mg/dL
Microalb/Creat Ratio: 9 mg/g{creat} (ref 0–29)
Microalbumin, Urine: 10.1 ug/mL

## 2024-09-04 ENCOUNTER — Ambulatory Visit: Admitting: Nurse Practitioner

## 2024-09-15 ENCOUNTER — Encounter: Admitting: Nurse Practitioner

## 2024-12-10 ENCOUNTER — Encounter: Admitting: Nurse Practitioner

## 2024-12-17 ENCOUNTER — Encounter: Admitting: Nurse Practitioner
